# Patient Record
Sex: Male | Born: 1945 | Race: Black or African American | Hispanic: No | Marital: Married | Smoking: Former smoker
Health system: Southern US, Community
[De-identification: ages and names within clinical notes are randomized; demographics above are authoritative.]

## PROBLEM LIST (undated history)

## (undated) DIAGNOSIS — E785 Hyperlipidemia, unspecified: Secondary | ICD-10-CM

## (undated) DIAGNOSIS — M549 Dorsalgia, unspecified: Secondary | ICD-10-CM

## (undated) DIAGNOSIS — K219 Gastro-esophageal reflux disease without esophagitis: Secondary | ICD-10-CM

## (undated) DIAGNOSIS — K7460 Unspecified cirrhosis of liver: Secondary | ICD-10-CM

## (undated) DIAGNOSIS — J449 Chronic obstructive pulmonary disease, unspecified: Secondary | ICD-10-CM

## (undated) DIAGNOSIS — I10 Essential (primary) hypertension: Secondary | ICD-10-CM

## (undated) DIAGNOSIS — N529 Male erectile dysfunction, unspecified: Secondary | ICD-10-CM

## (undated) DIAGNOSIS — N400 Enlarged prostate without lower urinary tract symptoms: Secondary | ICD-10-CM

## (undated) DIAGNOSIS — D649 Anemia, unspecified: Secondary | ICD-10-CM

## (undated) HISTORY — DX: Dorsalgia, unspecified: M54.9

## (undated) HISTORY — DX: Hyperlipidemia, unspecified: E78.5

## (undated) HISTORY — DX: Male erectile dysfunction, unspecified: N52.9

## (undated) HISTORY — PX: HERNIA REPAIR: SHX51

## (undated) HISTORY — DX: Benign prostatic hyperplasia without lower urinary tract symptoms: N40.0

## (undated) NOTE — *Deleted (*Deleted)
[facility]  [address] [place], [place] [address] Phone: [phone]  Fax: [phone]   Office Visit:  07/14/20  Referring physician: Maple Hudson.,*  Chief Complaint: Dwayne Thompson is a 35 y.o. male with extensive stage small cell lung cancer who is seen for 1 week assessment.  HPI:  The patient was last seen in the medical oncology clinic on 07/06/2020. At that time, ***. Hematocrit was 28.6, hemoglobin 9.9, MCV 85.4, platelets 149,000, WBC 9,000. Sodium was 128. Chloride was 96. Calcium was 8.6. Albumin was 2.7. TSH was 1.679 and free T4 was 7.4. Magnesium was 1.7. He received cycle #1 carboplatin, etoposide and Tecentriq.  He received Etoposide on 07/07/2020 and 07/08/2020. He received Neulasta on 07/09/2020.  The patient saw Dr. Thelma Barge on 07/13/2020. His breathing was somewhat better. He had been draining his Pleurx catheter every 2-3 days. They planned to remove the catheter in 1 week if he still had minimal drainage.  CXR on 07/13/2020 showed PleurX catheter present. Left pleural effusion appeared similar on the left, partially loculated. Airspace opacity throughout the mid and lower lung regions. There was slightly less opacity in the left upper lobe compared to most recent study. Right lung was essentially clear. Stable cardiac silhouette.  During the interim, ***  Past Medical History:  Diagnosis Date  . Anemia   . Back pain   . BPH (benign prostatic hyperplasia)   . Cirrhosis (HCC)   . COPD (chronic obstructive pulmonary disease) (HCC)   . ED (erectile dysfunction)   . GERD (gastroesophageal reflux disease)   . Hyperlipidemia   . Hypertension    Past Surgical History:  Procedure Laterality Date  . COLONOSCOPY Left 01/03/2018   Procedure: COLONOSCOPY;  Surgeon: Pasty Spillers, MD;  Location: [facility] ENDOSCOPY;  Service: Endoscopy;  Laterality: Left;  . COLONOSCOPY WITH PROPOFOL N/A 04/05/2018   Procedure:  COLONOSCOPY WITH PROPOFOL;  Surgeon: Pasty Spillers, MD;  Location: [facility] ENDOSCOPY;  Service: Endoscopy;  Laterality: N/A;  . CYSTOSCOPY WITH STENT PLACEMENT Right 09/26/2019   Procedure: CYSTOSCOPY WITH STENT PLACEMENT;  Surgeon: Orson Ape, MD;  Location: [facility] ORS;  Service: Urology;  Laterality: Right;  . ENTEROSCOPY  01/03/2018   Procedure: ENTEROSCOPY;  Surgeon: Pasty Spillers, MD;  Location: [facility] ENDOSCOPY;  Service: Endoscopy;;  . ESOPHAGOGASTRODUODENOSCOPY N/A 01/02/2018   Procedure: ESOPHAGOGASTRODUODENOSCOPY (EGD);  Surgeon: Pasty Spillers, MD;  Location: [facility] ENDOSCOPY;  Service: Endoscopy;  Laterality: N/A;  . ESOPHAGOGASTRODUODENOSCOPY (EGD) WITH PROPOFOL N/A 04/05/2018   Procedure: ESOPHAGOGASTRODUODENOSCOPY (EGD) WITH PROPOFOL;  Surgeon: Pasty Spillers, MD;  Location: [facility] ENDOSCOPY;  Service: Endoscopy;  Laterality: N/A;  . exploration and neurolysis of right peroneal nerve at the knee Right 08/27/2018  . GREEN LIGHT LASER TURP (TRANSURETHRAL RESECTION OF PROSTATE N/A 09/26/2019   Procedure: GREEN LIGHT LASER TURP (TRANSURETHRAL RESECTION OF PROSTATE;  Surgeon: Orson Ape, MD;  Location: [facility] ORS;  Service: Urology;  Laterality: N/A;  . HERNIA REPAIR     1980's  . PORTACATH PLACEMENT Left 06/29/2020   Procedure: INSERTION PORT-A-CATH;  Surgeon: Hulda Marin, MD;  Location: [facility] ORS;  Service: Thoracic;  Laterality: Left;  Marland Kitchen VIDEO ASSISTED THORACOSCOPY (VATS)/THOROCOTOMY Left 06/29/2020   Procedure: VIDEO ASSISTED THORACOSCOPY;  Surgeon: Hulda Marin, MD;  Location: [facility] ORS;  Service: Thoracic;  Laterality: Left;   Family History  Problem Relation Age of Onset  . Alzheimer's disease Mother   . Sarcoidosis Sister   . Anxiety disorder Brother   .  Kidney disease Neg Hx   . Prostate cancer Neg Hx      Social History: reports that he quit smoking about 6 years ago. His smoking use included cigarettes. He has a 50.00 pack-year smoking history. He  has never used smokeless tobacco. He reports current alcohol use of about 21.0 - 28.0 standard drinks of alcohol per week. He reports that he does not use drugs. He drinks 2-3 alcohol shots/day. His wife's name isJanice and granddaughter's name isAriel. The patient is accompanied by his wife and Kara Dies, nurse navigator*** today.  Allergies:  Allergies  Allergen Reactions  . Indomethacin Swelling    ankle swelling  . Tamsulosin Hcl Hives  . Sulfa Antibiotics Rash   Current Outpatient Medications  Medication Instructions  . acetaminophen (TYLENOL) 650 mg, Oral, 3 times daily  . cetirizine (ZYRTEC) 10 mg, Oral, Daily  . cholecalciferol (VITAMIN D3) 1,000 Units, Oral, Daily  . cyanocobalamin 1,000 mcg, Oral, Daily  . diclofenac Sodium (VOLTAREN) 1 % GEL 1 application, Topical, 4 times daily PRN  . dutasteride (AVODART) 0.5 mg, Oral, Daily  . feeding supplement (ENSURE ENLIVE / ENSURE PLUS) LIQD 237 mLs, Oral, 2 times daily between meals  . folic acid (FOLVITE) 1 mg, Oral, Daily  . lactulose (CHRONULAC) 30 g, Oral, 2 times daily  . lidocaine (LIDODERM) 5 % 1 patch, Transdermal, Every 24 hours, Remove & Discard patch within 12 hours or as directed by MD  . Lidocaine (SALONPAS PAIN RELIEVING) 4 % PTCH 1 patch, Apply externally,  Every morning - 10a  . megestrol (MEGACE) 200 mg, Oral, Daily  . midodrine (PROAMATINE) 10 mg, Oral, 4 times daily  . montelukast (SINGULAIR) 10 MG tablet TAKE 1 TABLET BY MOUTH EVERY DAY  . ondansetron (ZOFRAN) 8 mg, Oral, 2 times daily PRN, Start on day 3 after carboplatin chemo.  Marland Kitchen oxyCODONE (ROXICODONE) 15 mg, Oral, Every 4 hours PRN  . pantoprazole (PROTONIX) 20 mg, Oral, 2 times daily  . polyethylene glycol (MIRALAX / GLYCOLAX) 17 g, Oral, Daily PRN  . potassium chloride (K-DUR,KLOR-CON) 10 MEQ tablet 10 mEq, Oral, 2 times daily  . prochlorperazine (COMPAZINE) 10 mg, Oral, Every 6 hours PRN  . rosuvastatin (CRESTOR) 10 mg, Oral, Daily    Review of  Systems  Constitutional: Negative for chills, diaphoresis, fever, malaise/fatigue and weight loss.  HENT: Negative for congestion, ear discharge, ear pain, hearing loss, nosebleeds, sinus pain, sore throat and tinnitus.   Eyes: Negative for blurred vision and double vision.  Respiratory: Positive for shortness of breath (on exertion). Negative for cough, hemoptysis and sputum production.        Left-sided chest wall pain. On 2 liters/min oxygen prn.  Cardiovascular: Negative for chest pain, palpitations and leg swelling.  Gastrointestinal: Positive for constipation (takes miralax BID). Negative for abdominal pain, blood in stool, diarrhea, heartburn, melena, nausea and vomiting.       Poor appetite. Drinking Ensure BID.  Genitourinary: Negative for frequency and urgency.  Musculoskeletal: Negative for back pain, joint pain, myalgias and neck pain.  Skin: Negative for itching and rash.       Bed sore.  Neurological: Negative for dizziness, tingling, sensory change, weakness and headaches.  Endo/Heme/Allergies: Does not bruise/bleed easily.  Psychiatric/Behavioral: Negative for depression and memory loss. The patient is not nervous/anxious and does not have insomnia.   All other systems reviewed and are negative.   Performance status: 2***  Vital Signs Blood pressure (!) 102/58, pulse 92, temperature 97.8 F (36.6 C), temperature  source Tympanic, weight 181 lb (82.1 kg), SpO2 97 %.  Physical Exam Vitals and nursing note reviewed.  Constitutional:      General: He is not in acute distress.    Appearance: He is not diaphoretic.  HENT:     Head: Normocephalic and atraumatic.     Comments: Short graying hair.    Mouth/Throat:     Mouth: Mucous membranes are moist.     Pharynx: Oropharynx is clear.  Eyes:     General: No scleral icterus.    Extraocular Movements: Extraocular movements intact.     Conjunctiva/sclera: Conjunctivae normal.     Pupils: Pupils are equal, round, and  reactive to light.     Comments: Glasses. Brown eyes.  Cardiovascular:     Rate and Rhythm: Normal rate and regular rhythm.     Heart sounds: Normal heart sounds. No murmur heard.   Pulmonary:     Effort: Pulmonary effort is normal. No respiratory distress.     Breath sounds: Normal breath sounds. No wheezing or rales.  Chest:     Chest wall: No tenderness.  Abdominal:     General: Bowel sounds are normal. There is no distension.     Palpations: Abdomen is soft. There is no mass.     Tenderness: There is no abdominal tenderness. There is no guarding or rebound.  Musculoskeletal:        General: Tenderness (left chest wall) present. No swelling. Normal range of motion.     Cervical back: Normal range of motion and neck supple.     Right lower leg: Edema (ankles) present.     Left lower leg: Edema (ankles) present.  Skin:    General: Skin is warm and dry.     Comments: Lidoderm patch on left chest. Pressure ulcer on bottom.  Neurological:     Mental Status: He is alert and oriented to person, place, and time.  Psychiatric:        Behavior: Behavior normal.        Thought Content: Thought content normal.        Judgment: Judgment normal.     No results found for this or any previous visit (from the past 48 hour(s)). DG Chest 2 View  Result Date: 07/13/2020 CLINICAL DATA:  Pleural effusion EXAM: CHEST - 2 VIEW COMPARISON:  June 29, 2020 FINDINGS: PleurX catheter again noted on the left with tip directed posteriorly. There is partially loculated left pleural effusion. There is airspace consolidation throughout mid and lower lung regions on the left. The right lung is clear. Heart is slightly enlarged with pulmonary vascularity normal. No adenopathy. No pneumothorax. There is bullous disease in the extreme right apex. No bone lesions. Port-A-Cath tip in superior vena cava. IMPRESSION: PleurX catheter present. Left pleural effusion appears similar on the left, partially loculated.  Airspace opacity throughout the mid and lower lung regions. Slightly less opacity in the left upper lobe compared to most recent study. Right lung essentially clear. Stable cardiac silhouette. Electronically Signed   By: Bretta Bang III M.D.   On: 07/13/2020 10:10    Assessment:  Dwayne Thompson is a 38 y.o. male with extensive stage small cell lung cancer s/p left chest wall/pleural biopsy on 06/24/2020.  He presented with left hemothorax. Thoracentesis x2 (05/20/2020 and 06/05/2020) revealed bloody fluid with no evidence of malignancy.  He has a chest tube in place.  Chest CT on 06/05/2020 revealed pleural thickening, nodularity and pleural-based mass with central adenopathy  versus mass suspicious for pulmonary neoplasm with extensive pleural metastasis.  Mesothelioma is in the differential.  There was increasing left subcarinal lymph node use into the left mainstem bronchus.  There was increasing upper abdominal lymph nodes.  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  There was accentuated activity along the consolidative and atelectatic left lower lobe probably from tumor within the lung but potentially from tumor along the visceral pleura.  There was hypermetabolic thoracic adenopathy along with hypermetabolic upper abdominal adenopathy and hypermetabolic skeletal metastatic lesions (right iliac bone SUV 11.9 and T1). There was possible early tumor extension into the left T11-12 neuroforamen with some erosion of the adjacent T11 vertebral body.  CXR on 06/22/2020 revealed complete of opacification of the left lung secondary to a large pleural effusion.  There was right sided shift.  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 He drinks alcohol daily.  Symptomatically, ***  Plan:   1.   Labs today:  CBC with diff, BMP, Mg   2.   Extensive stage small  cell lung cancer             He has a 50 pack year smoking history.             PET scan on 06/12/2020 revealed pleural based disease, hypermetabolic adenopathy, and skeletal metastasis.             Pleural biopsy on 06/24/2020 confirmed small cell lung cancer.  Head MRI  and thoracic spine MRI were unable to be performed today secondary to pain.   He was unable to lie flat.  Will try to repeat prior to discharge.  Review plan for carboplatin, etopside, and Tecentriq every 3 weeks in the outpatient department.   Information sheets on chemotherapy provided today.  Discuss plan for outpatient chemotherapy this week (3 days).  Patient scheduled for port-a-cath tomorrow.  Will arrange chemotherapy teaching as an inpatient.  Several questions asked and answered. 2.   Recurrent pleural effusion  Chest tube remains in place.  Patient scheduled for thoracoscopy with talc pleurodesis tomorrow. 3.   Normocytic anemia             Hematocrit 28.8.  Hemoglobin 9.8.  MCV 89.2.   Ferritin 112 with an iron saturation of 13% (low) and TIBC 328 on 05/20/2020.   Ferritin 359 with an iron saturation of 14% (low) and TIBC 181 on 06/27/2020.  Etiology secondary to hemothorax.           4.   Renal insufficiency             Creatinine 0.87 today.  Creatinine 2.75 on admission.             Baseline creatinine 0.85 - 1.12. 5.  Cancer-related pain             Symptomatically, pain has improved since admission.  Pain exacerbated with MRI attempt today.  Continue oxycodone 10 mg po q 4 hours prn pain. 6.   Disposition  Discuss plans for possible discharge early this week to allow 3 days of chemotherapy in the outpatient department.   Please wait for chemistries to ensure OK for chemo. Begin IVF 150 cc/hr prior to chemotherapy.  Patient's wife bringing midodrine. RN:  Ensure samples. Day 1 of cycle #1 carboplatin, etoposide, and Tecentriq today. RTC tomorrow for day 2 etoposide. RTC on  Wednesday for day 3  etoposide. RTC on Thursday for Neulasta. TYC on 10/27 for MD assess, and labs (CBC with diff, BMP, Mg).  I discussed the assessment and treatment plan with the patient.  The patient was provided an opportunity to ask questions and all were answered.  The patient agreed with the plan and demonstrated an understanding of the instructions.  The patient was advised to call back or seek an in person evaluation if the symptoms worsen or if the condition fails to improve as anticipated.  I provided 19 minutes of face-to-face time during this this encounter and > 50% was spent counseling as documented under my assessment and plan.  Nelva Nay, MD, PhD  02/19/2020, 2:42 PM  I, Danella Penton Tufford, am acting as a Neurosurgeon for Rosey Bath, MD.  I, Melissa C. Merlene Pulling, MD, have reviewed the above documentation for accuracy and completeness, and I agree with the above.

---

## 2005-03-02 ENCOUNTER — Ambulatory Visit: Payer: Self-pay | Admitting: Internal Medicine

## 2007-08-20 ENCOUNTER — Ambulatory Visit: Payer: Self-pay | Admitting: Internal Medicine

## 2007-09-11 ENCOUNTER — Ambulatory Visit: Payer: Self-pay | Admitting: Internal Medicine

## 2007-09-20 ENCOUNTER — Ambulatory Visit: Payer: Self-pay | Admitting: Internal Medicine

## 2007-10-21 ENCOUNTER — Ambulatory Visit: Payer: Self-pay | Admitting: Internal Medicine

## 2007-11-18 ENCOUNTER — Ambulatory Visit: Payer: Self-pay | Admitting: Internal Medicine

## 2010-10-11 ENCOUNTER — Ambulatory Visit: Payer: Self-pay | Admitting: Family Medicine

## 2013-03-04 ENCOUNTER — Ambulatory Visit: Payer: Self-pay | Admitting: Family Medicine

## 2013-10-18 LAB — LIPID PANEL
Cholesterol: 151 mg/dL (ref 0–200)
HDL: 53 mg/dL (ref 35–70)
LDL Cholesterol: 78 mg/dL
Triglycerides: 101 mg/dL (ref 40–160)

## 2014-02-06 LAB — CBC AND DIFFERENTIAL
HCT: 44 % (ref 41–53)
Hemoglobin: 15.5 g/dL (ref 13.5–17.5)
Neutrophils Absolute: 3 /uL
Platelets: 115 10*3/uL — AB (ref 150–399)
WBC: 5.6 10^3/mL

## 2014-02-06 LAB — BASIC METABOLIC PANEL
BUN: 11 mg/dL (ref 4–21)
Creatinine: 0.8 mg/dL (ref 0.6–1.3)
Glucose: 94 mg/dL
Potassium: 3.4 mmol/L (ref 3.4–5.3)
Sodium: 140 mmol/L (ref 137–147)

## 2014-02-06 LAB — HEPATIC FUNCTION PANEL
ALT: 17 U/L (ref 10–40)
AST: 22 U/L (ref 14–40)
Alkaline Phosphatase: 57 U/L (ref 25–125)
Bilirubin, Total: 0.4 mg/dL

## 2014-10-22 LAB — PSA: PSA: 3

## 2015-01-13 ENCOUNTER — Ambulatory Visit
Admit: 2015-01-13 | Disposition: A | Discharge status: Home / Self Care | Payer: Self-pay | Attending: Gastroenterology | Admitting: Gastroenterology

## 2015-01-13 LAB — HM COLONOSCOPY

## 2015-01-15 LAB — SURGICAL PATHOLOGY

## 2015-03-28 DIAGNOSIS — N529 Male erectile dysfunction, unspecified: Secondary | ICD-10-CM | POA: Insufficient documentation

## 2015-03-28 DIAGNOSIS — G939 Disorder of brain, unspecified: Secondary | ICD-10-CM | POA: Insufficient documentation

## 2015-03-28 DIAGNOSIS — J309 Allergic rhinitis, unspecified: Secondary | ICD-10-CM | POA: Insufficient documentation

## 2015-03-28 DIAGNOSIS — N400 Enlarged prostate without lower urinary tract symptoms: Secondary | ICD-10-CM | POA: Insufficient documentation

## 2015-03-28 DIAGNOSIS — K219 Gastro-esophageal reflux disease without esophagitis: Secondary | ICD-10-CM | POA: Insufficient documentation

## 2015-03-28 DIAGNOSIS — I6782 Cerebral ischemia: Secondary | ICD-10-CM | POA: Insufficient documentation

## 2015-03-28 DIAGNOSIS — IMO0002 Reserved for concepts with insufficient information to code with codable children: Secondary | ICD-10-CM | POA: Insufficient documentation

## 2015-03-28 DIAGNOSIS — Z87891 Personal history of nicotine dependence: Secondary | ICD-10-CM | POA: Insufficient documentation

## 2015-03-28 DIAGNOSIS — M1990 Unspecified osteoarthritis, unspecified site: Secondary | ICD-10-CM | POA: Insufficient documentation

## 2015-04-29 ENCOUNTER — Encounter: Payer: Self-pay | Admitting: Family Medicine

## 2015-04-29 ENCOUNTER — Ambulatory Visit (INDEPENDENT_AMBULATORY_CARE_PROVIDER_SITE_OTHER): Payer: Medicare Other | Admitting: Family Medicine

## 2015-04-29 VITALS — BP 128/82 | HR 72 | Temp 98.1°F | Resp 14 | Ht 71.5 in | Wt 203.0 lb

## 2015-04-29 DIAGNOSIS — Z791 Long term (current) use of non-steroidal anti-inflammatories (NSAID): Secondary | ICD-10-CM | POA: Diagnosis not present

## 2015-04-29 DIAGNOSIS — M47896 Other spondylosis, lumbar region: Secondary | ICD-10-CM

## 2015-04-29 DIAGNOSIS — N400 Enlarged prostate without lower urinary tract symptoms: Secondary | ICD-10-CM | POA: Diagnosis not present

## 2015-04-29 DIAGNOSIS — IMO0002 Reserved for concepts with insufficient information to code with codable children: Secondary | ICD-10-CM

## 2015-04-29 DIAGNOSIS — D696 Thrombocytopenia, unspecified: Secondary | ICD-10-CM

## 2015-04-29 DIAGNOSIS — M47816 Spondylosis without myelopathy or radiculopathy, lumbar region: Secondary | ICD-10-CM | POA: Insufficient documentation

## 2015-04-29 NOTE — Progress Notes (Signed)
Patient ID: ARK AGRUSA, male   DOB: 08-20-1946, 69 y.o.   MRN: [id number]    Subjective:  HPI  Chronic back pain follow up:  Patient is here for 6 months follow up.  He takes Ibuprofen 800 mg at bedtime 1 tablet at bedtime.  He has to take 2 tables very seldom. Medication is controlling symptoms.  BPH follow up: Patient takes Cardura daily and this helps his symptoms. No issues today per patient.   Last labs were in 01/2014.  Prior to Admission medications   Medication Sig Start Date End Date Taking? Authorizing Provider  aspirin 81 MG tablet Take by mouth.   Yes Historical Provider, MD  Cholecalciferol 1000 UNITS tablet Take by mouth.   Yes Historical Provider, MD  doxazosin (CARDURA) 4 MG tablet Take by mouth. 11/10/14  Yes Historical Provider, MD  ibuprofen (ADVIL,MOTRIN) 800 MG tablet Take by mouth. 07/12/14  Yes Historical Provider, MD  Multiple Vitamin (MULTIVITAMIN) tablet Take 1 tablet by mouth daily.   Yes Historical Provider, MD  Omega-3 Fatty Acids (FISH OIL BURP-LESS) 1200 MG CAPS Take by mouth.   Yes Historical Provider, MD  sildenafil (VIAGRA) 100 MG tablet Take by mouth. 05/31/13  Yes Historical Provider, MD    Patient Active Problem List   Diagnosis Date Noted  . Allergic rhinitis 03/28/2015  . 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 Failure of erection 03/28/2015  . Acid reflux 03/28/2015  . Arthritis, degenerative 03/28/2015  . Change in blood platelet count 03/28/2015  . Temporary cerebral vascular dysfunction 03/28/2015  . Current tobacco use 03/28/2015    No past medical history on file.  Social History   Social History  . Marital Status: Married    Spouse Name: [name]  . Number of Children: 3  . Years of Education: 12   Occupational History  . retired    Social History Main Topics  . Smoking status: Former Smoker -- 0.50 packs/day for 25 years    Types: Cigarettes    Quit date: 10/20/2012  . Smokeless tobacco: Never Used  . Alcohol Use:  Yes     Comment: moderate  . Drug Use: No  . Sexual Activity: Not Currently   Other Topics Concern  . Not on file   Social History Narrative    Allergies  Allergen Reactions  . Tamsulosin Hcl Hives    Review of Systems  Constitutional: Negative for fever, chills, weight loss and malaise/fatigue.  Respiratory: Negative for cough, hemoptysis, sputum production and shortness of breath.   Cardiovascular: Negative for chest pain, palpitations, orthopnea and leg swelling.  Gastrointestinal: Negative for heartburn, nausea and vomiting.  Musculoskeletal: Positive for back pain. Negative for myalgias, joint pain, falls and neck pain.  Neurological: Negative for dizziness, tingling, tremors, weakness and headaches.  Psychiatric/Behavioral: Negative for memory loss. The patient is not nervous/anxious and does not have insomnia.     Immunization History  Administered Date(s) Administered  . Pneumococcal Conjugate-13 02/06/2014  . Pneumococcal Polysaccharide-23 03/05/2012  . Tdap 04/28/2007   Objective:  BP 128/82 mmHg  Pulse 72  Temp(Src) 98.1 F (36.7 C)  Resp 14  Ht 5' 11.5" (1.816 m)  Wt 203 lb (92.08 kg)  BMI 27.92 kg/m2  Physical Exam  Constitutional: He is oriented to person, place, and time and well-developed, well-nourished, and in no distress.  HENT:  Head: Normocephalic.  Eyes: Conjunctivae are normal. Pupils are equal, round, and reactive to light.  Neck: Normal range of motion. Neck supple.  Cardiovascular: Normal rate, regular rhythm, normal heart sounds and intact distal pulses.   No murmur heard. Pulmonary/Chest: Effort normal and breath sounds normal.  Musculoskeletal: Normal range of motion. He exhibits no edema or tenderness.  Neurological: He is alert and oriented to person, place, and time. Gait normal.  Psychiatric: Mood, memory, affect and judgment normal.    Lab Results  Component Value Date   WBC 5.6 02/06/2014   HGB 15.5 02/06/2014   HCT 44  02/06/2014   PLT 115* 02/06/2014   CHOL 151 10/18/2013   TRIG 101 10/18/2013   HDL 53 10/18/2013   LDLCALC 78 10/18/2013   PSA 3.0 10/22/2014    CMP     Component Value Date/Time   NA 140 02/06/2014   K 3.4 02/06/2014   BUN 11 02/06/2014   CREATININE 0.8 02/06/2014   AST 22 02/06/2014   ALT 17 02/06/2014   ALKPHOS 57 02/06/2014    Assessment and Plan :  1. Benign fibroma of prostate Stable on medication.  2. Other osteoarthritis of spine, lumbar region Stable. Advised to not over use Ibuprofen due to potential risks of on going use of this.  3. Change in blood platelet count History of this. Check labs. - CBC with Differential/Platelet  4. Encounter for long-term use of non-steroidal anti-inflammatory medication Pending results. - COMPLETE METABOLIC PANEL WITH GFR Advised patient to only use ibuprofen when he is having significant back pain. Advised of the risk of ulcers, kidney damage, and liver risk.    Patient was seen and examined by Dr. Eulas Post and note was scribed by Theressa Millard, RMA.     [name] MD [facility] 04/29/2015 8:21 AM

## 2015-04-30 ENCOUNTER — Telehealth: Payer: Self-pay | Admitting: Family Medicine

## 2015-04-30 LAB — CBC WITH DIFFERENTIAL/PLATELET
Basophils Absolute: 0 10*3/uL (ref 0.0–0.2)
Basos: 1 %
EOS (ABSOLUTE): 0.2 10*3/uL (ref 0.0–0.4)
Eos: 4 %
Hematocrit: 45.9 % (ref 37.5–51.0)
Hemoglobin: 15.6 g/dL (ref 12.6–17.7)
Immature Grans (Abs): 0 10*3/uL (ref 0.0–0.1)
Immature Granulocytes: 0 %
Lymphocytes Absolute: 1.9 10*3/uL (ref 0.7–3.1)
Lymphs: 34 %
MCH: 32.4 pg (ref 26.6–33.0)
MCHC: 34 g/dL (ref 31.5–35.7)
MCV: 95 fL (ref 79–97)
Monocytes Absolute: 0.5 10*3/uL (ref 0.1–0.9)
Monocytes: 9 %
Neutrophils Absolute: 2.9 10*3/uL (ref 1.4–7.0)
Neutrophils: 52 %
Platelets: 121 10*3/uL — ABNORMAL LOW (ref 150–379)
RBC: 4.81 x10E6/uL (ref 4.14–5.80)
RDW: 14.2 % (ref 12.3–15.4)
WBC: 5.5 10*3/uL (ref 3.4–10.8)

## 2015-04-30 LAB — COMPREHENSIVE METABOLIC PANEL
ALT: 44 IU/L (ref 0–44)
AST: 41 IU/L — ABNORMAL HIGH (ref 0–40)
Albumin/Globulin Ratio: 1.7 (ref 1.1–2.5)
Albumin: 4.6 g/dL (ref 3.6–4.8)
Alkaline Phosphatase: 65 IU/L (ref 39–117)
BUN/Creatinine Ratio: 9 — ABNORMAL LOW (ref 10–22)
BUN: 8 mg/dL (ref 8–27)
Bilirubin Total: 0.3 mg/dL (ref 0.0–1.2)
CO2: 23 mmol/L (ref 18–29)
Calcium: 9.8 mg/dL (ref 8.6–10.2)
Chloride: 101 mmol/L (ref 97–108)
Creatinine, Ser: 0.85 mg/dL (ref 0.76–1.27)
GFR calc Af Amer: 103 mL/min/{1.73_m2} (ref >59)
GFR calc non Af Amer: 89 mL/min/{1.73_m2} (ref >59)
Globulin, Total: 2.7 g/dL (ref 1.5–4.5)
Glucose: 106 mg/dL — ABNORMAL HIGH (ref 65–99)
Potassium: 4.2 mmol/L (ref 3.5–5.2)
Sodium: 143 mmol/L (ref 134–144)
Total Protein: 7.3 g/dL (ref 6.0–8.5)

## 2015-04-30 NOTE — Telephone Encounter (Signed)
Pt stated he was returning Brittany's call. Thanks TNP

## 2015-07-24 ENCOUNTER — Other Ambulatory Visit: Payer: Self-pay | Admitting: Family Medicine

## 2015-08-19 ENCOUNTER — Ambulatory Visit (INDEPENDENT_AMBULATORY_CARE_PROVIDER_SITE_OTHER): Payer: Medicare Other

## 2015-08-19 DIAGNOSIS — Z23 Encounter for immunization: Secondary | ICD-10-CM

## 2015-09-01 ENCOUNTER — Other Ambulatory Visit: Payer: Self-pay | Admitting: Family Medicine

## 2015-09-01 NOTE — Telephone Encounter (Signed)
Dr. Inez Pilgrim not going to refill this one due to his age without you confirming it ok Thanks ED

## 2015-10-29 ENCOUNTER — Encounter: Payer: Medicare Other | Admitting: Family Medicine

## 2015-11-18 ENCOUNTER — Ambulatory Visit (INDEPENDENT_AMBULATORY_CARE_PROVIDER_SITE_OTHER): Payer: Medicare Other | Admitting: Family Medicine

## 2015-11-18 ENCOUNTER — Encounter: Payer: Self-pay | Admitting: Family Medicine

## 2015-11-18 VITALS — BP 128/82 | HR 80 | Temp 97.8°F | Resp 12 | Ht 71.0 in | Wt 209.0 lb

## 2015-11-18 DIAGNOSIS — Z0000 Encounter for general adult medical examination without abnormal findings: Secondary | ICD-10-CM

## 2015-11-18 NOTE — Progress Notes (Signed)
Patient ID: [name], male   DOB: [birth year], 70 y.o.   MRN: [id number]  Visit Date: 11/18/2015  Today's Provider: [name], MD   Chief Complaint  Patient presents with  . Medicare Wellness   Subjective:   Dwayne Thompson is a 70 y.o. male who presents today for his Subsequent Annual Wellness Visit. He feels well. He reports exercising none. He reports he is sleeping well. Immunization History  Administered Date(s) Administered  . Influenza, High Dose Seasonal PF 08/19/2015  . Pneumococcal Conjugate-13 02/06/2014  . Pneumococcal Polysaccharide-23 03/05/2012  . Tdap 04/28/2007  Patient states he knows he needs to stop drinking alcohol so much. He drinks daily 3 to 4 drinks a day.   Last colonoscopy 01/13/15 Dr Rayann Heman, 4 polyps, internal hemorrhoids, repeat in 5 years.   Review of Systems  Constitutional: Negative.   HENT: Negative.   Eyes: Negative.   Respiratory: Negative.   Cardiovascular: Negative.   Gastrointestinal: Negative.   Endocrine: Negative.   Genitourinary: Negative.   Musculoskeletal: Negative.   Skin: Negative.   Allergic/Immunologic: Negative.   Neurological: Negative.   Hematological: Negative.   Psychiatric/Behavioral: Negative.     Patient Active Problem List   Diagnosis Date Noted  . 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 Allergic rhinitis 03/28/2015  . 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 Failure of erection 03/28/2015  . Acid reflux 03/28/2015  . Arthritis, degenerative 03/28/2015  . Change in blood platelet count 03/28/2015  . Temporary cerebral vascular dysfunction 03/28/2015  . Current tobacco use 03/28/2015    Social History   Social History  . Marital Status: Married    Spouse Name: Dwayne Thompson  . Number of Children: 3  . Years of Education: 12   Occupational History  . retired    Social History Main Topics  . Smoking status: Former Smoker -- 0.50 packs/day for 25 years    Types: Cigarettes    Quit date:  10/20/2012  . Smokeless tobacco: Never Used  . Alcohol Use: Yes     Comment: moderate  . Drug Use: No  . Sexual Activity: Not Currently   Other Topics Concern  . Not on file   Social History Narrative    Past Surgical History  Procedure Laterality Date  . Hernia repair      His family history includes Alzheimer's disease in his mother; Anxiety disorder in his brother; Diabetes in his brother; Hypertension in his brother; Sarcoidosis in his sister.    Outpatient Prescriptions Prior to Visit  Medication Sig Dispense Refill  . aspirin 81 MG tablet Take by mouth.    . Cholecalciferol 1000 UNITS tablet Take by mouth.    . doxazosin (CARDURA) 4 MG tablet TAKE 1 TABLET DAILY 90 tablet 2  . ibuprofen (ADVIL,MOTRIN) 800 MG tablet TAKE 1 TABLET THREE TIMES A DAY AS NEEDED 270 tablet 2  . Multiple Vitamin (MULTIVITAMIN) tablet Take 1 tablet by mouth daily.    . Omega-3 Fatty Acids (FISH OIL BURP-LESS) 1200 MG CAPS Take by mouth.    . sildenafil (VIAGRA) 100 MG tablet Take by mouth.     No facility-administered medications prior to visit.    Allergies  Allergen Reactions  . Tamsulosin Hcl Hives    Patient Care Team: Jerrol Banana., MD as PCP - General (Family Medicine)  Objective:   Vitals:  Filed Vitals:   11/18/15 0942  BP: 128/82  Pulse: 80  Temp: 97.8 F (36.6 C)  Resp:  12  Height: '5\' 11"'$  (1.803 m)  Weight: 209 lb (94.802 kg)    Physical Exam  Constitutional: He is oriented to person, place, and time. He appears well-developed and well-nourished.  HENT:  Head: Normocephalic and atraumatic.  Right Ear: External ear normal.  Left Ear: External ear normal.  Nose: Nose normal.  Mouth/Throat: Oropharynx is clear and moist.  Eyes: Conjunctivae and EOM are normal. Pupils are equal, round, and reactive to light.  Neck: Neck supple. No thyromegaly present.  Cardiovascular: Normal rate, regular rhythm, normal heart sounds and intact distal pulses.    Pulmonary/Chest: Effort normal and breath sounds normal.  Abdominal: Soft.  Musculoskeletal: He exhibits no edema.  Lymphadenopathy:    He has no cervical adenopathy.  Neurological: He is alert and oriented to person, place, and time.  Skin: Skin is warm and dry.  Psychiatric: He has a normal mood and affect. His behavior is normal. Judgment and thought content normal.    Activities of Daily Living In your present state of health, do you have any difficulty performing the following activities: 04/29/2015  Hearing? N  Vision? N  Difficulty concentrating or making decisions? N  Walking or climbing stairs? N  Dressing or bathing? N  Doing errands, shopping? N    Fall Risk Assessment Fall Risk  04/29/2015  Falls in the past year? No     Depression Screen PHQ 2/9 Scores 04/29/2015  PHQ - 2 Score 0    Cognitive Testing - 6-CIT    Year: 0 4 points  Month: 0 3 points  Memorize "Pia Mau, [address] [place]"  Time (within 1 hour:) 0 3 points  Count backwards from 20: 0 2 4 points  Name months of year: 0 2 4 points  Repeat Address: 0 '2 4 6 8 10 '$ points   Total Score: 8/28  Interpretation : Normal (0-7) Abnormal (8-28)    Assessment & Plan:     Annual Wellness Visit  Reviewed patient's Family Medical History Reviewed and updated list of patient's medical providers Assessment of cognitive impairment was done Assessed patient's functional ability Established a written schedule for health screening [place] Completed and Reviewed Patient now has 8 grandchildren. [name] is 57 weeks old. He completely retire from work 6 years ago. ED Refer to urology as patient has complete ED now. No Erectile function at all   [name] MD [facility] 11/18/2015 9:43 AM

## 2015-12-11 ENCOUNTER — Other Ambulatory Visit: Payer: Self-pay | Admitting: Emergency Medicine

## 2015-12-11 DIAGNOSIS — N529 Male erectile dysfunction, unspecified: Secondary | ICD-10-CM

## 2015-12-24 ENCOUNTER — Encounter: Payer: Self-pay | Admitting: Urology

## 2015-12-24 ENCOUNTER — Ambulatory Visit (INDEPENDENT_AMBULATORY_CARE_PROVIDER_SITE_OTHER): Payer: Medicare Other | Admitting: Urology

## 2015-12-24 VITALS — BP 110/76 | HR 78 | Ht 71.5 in | Wt 205.2 lb

## 2015-12-24 DIAGNOSIS — N138 Other obstructive and reflux uropathy: Secondary | ICD-10-CM | POA: Insufficient documentation

## 2015-12-24 DIAGNOSIS — N528 Other male erectile dysfunction: Secondary | ICD-10-CM

## 2015-12-24 DIAGNOSIS — N529 Male erectile dysfunction, unspecified: Secondary | ICD-10-CM | POA: Insufficient documentation

## 2015-12-24 DIAGNOSIS — N401 Enlarged prostate with lower urinary tract symptoms: Secondary | ICD-10-CM | POA: Insufficient documentation

## 2015-12-24 MED ORDER — FINASTERIDE 5 MG PO TABS: 5.0000 mg | ORAL_TABLET | Freq: Every day | ORAL | Status: DC

## 2015-12-24 MED ORDER — TADALAFIL 5 MG PO TABS: ORAL_TABLET | ORAL | Status: DC

## 2015-12-24 NOTE — Progress Notes (Signed)
12/24/2015 10:10 AM   Dwayne Thompson [birth year] [id number]  Referring provider: Jerrol Banana., MD [address] [place], [place] [address]  Chief Complaint  Patient presents with  . Erectile Dysfunction    referred by Dr. Rosanna Randy    HPI: Patient is 70 year old African-American male who is referred to [place] by his primary care physician, Dr. Rosanna Randy, for erectile dysfunction.  Erectile dysfunction His SHIM score is 8, which is moderate ED.   He has been having difficulty with erections for 2 years.   His major complaint is achieving and maintaining an erection.  His libido is preserved.   His risk factors for ED are age and BPH.   He denies any painful erections or curvatures with his erections.   He has tried Viagra in the past.  States at first it was effective, but now it is ineffective.      SHIM      12/24/15 0948       SHIM: Over the last 6 months:   How do you rate your confidence that you could get and keep an erection? Very Low     When you had erections with sexual stimulation, how often were your erections hard enough for penetration (entering your partner)? Almost Never or Never     During sexual intercourse, how often were you able to maintain your erection after you had penetrated (entered) your partner? Very Difficult     During sexual intercourse, how difficult was it to maintain your erection to completion of intercourse? Very Difficult     When you attempted sexual intercourse, how often was it satisfactory for you? Very Difficult     SHIM Total Score   SHIM 8        Score: 1-7 Severe ED 8-11 Moderate ED 12-16 Mild-Moderate ED 17-21 Mild ED 22-25 No ED   He was also found to have significant voiding symptoms.His IPSS score today is 14, which is moderate lower urinary tract symptomatology. He is unhappy with his quality life due to his urinary symptoms.  His major complaint today urgency and a weak urinary stream.  He has had these  symptoms for 2 years.  He denies any dysuria, hematuria or suprapubic pain.  He currently taking Cardura 4 mg daily.  He also denies any recent fevers, chills, nausea or vomiting.  He does not have a family history of PCa.      IPSS      12/24/15 0900       International Prostate Symptom Score   How often have you had the sensation of not emptying your bladder? Not at All     How often have you had to urinate less than every two hours? About half the time     How often have you found you stopped and started again several times when you urinated? Not at All     How often have you found it difficult to postpone urination? Almost always     How often have you had a weak urinary stream? About half the time     How often have you had to strain to start urination? Less than 1 in 5 times     How many times did you typically get up at night to urinate? 2 Times     Total IPSS Score 14     Quality of Life due to urinary symptoms   If you were to spend the rest of your life  with your urinary condition just the way it is now how would you feel about that? Unhappy        Score:  1-7 Mild 8-19 Moderate 20-35 Severe   PMH: Past Medical History  Diagnosis Date  . ED (erectile dysfunction)   . Back pain     Surgical History: Past Surgical History  Procedure Laterality Date  . Hernia repair      1980's    Home Medications:    Medication List       This list is accurate as of: 12/24/15 10:10 AM.  Always use your most recent med list.               aspirin 81 MG tablet  Take by mouth.     Cholecalciferol 1000 units tablet  Take by mouth.     doxazosin 4 MG tablet  Commonly known as:  CARDURA  TAKE 1 TABLET DAILY     finasteride 5 MG tablet  Commonly known as:  PROSCAR  Take 1 tablet (5 mg total) by mouth daily.     FISH OIL BURP-LESS 1200 MG Caps  Take by mouth. Reported on 12/24/2015     ibuprofen 800 MG tablet  Commonly known as:  ADVIL,MOTRIN  TAKE 1 TABLET THREE  TIMES A DAY AS NEEDED     multivitamin tablet  Take 1 tablet by mouth daily. Reported on 12/24/2015     tadalafil 5 MG tablet  Commonly known as:  CIALIS  Take one tablet daily     VIAGRA 100 MG tablet  Generic drug:  sildenafil  Take by mouth. Reported on 12/24/2015        Allergies:  Allergies  Allergen Reactions  . Tamsulosin Hcl Hives    Family History: Family History  Problem Relation Age of Onset  . Alzheimer's disease Mother   . Sarcoidosis Sister   . Diabetes Brother   . Hypertension Brother   . Anxiety disorder Brother   . Kidney disease Neg Hx   . Prostate cancer Neg Hx     Social History:  reports that he quit smoking about 3 years ago. His smoking use included Cigarettes. He has a 12.5 pack-year smoking history. He has never used smokeless tobacco. He reports that he drinks alcohol. He reports that he does not use illicit drugs.  ROS: UROLOGY Frequent Urination?: No Hard to postpone urination?: Yes Burning/pain with urination?: No Get up at night to urinate?: Yes Leakage of urine?: Yes Urine stream starts and stops?: Yes Trouble starting stream?: No Do you have to strain to urinate?: No Blood in urine?: No Urinary tract infection?: No Sexually transmitted disease?: No Injury to kidneys or bladder?: No Painful intercourse?: No Weak stream?: Yes Erection problems?: Yes Penile pain?: No  Gastrointestinal Nausea?: No Vomiting?: No Indigestion/heartburn?: No Diarrhea?: No Constipation?: No  Constitutional Fever: No Night sweats?: Yes Weight loss?: No Fatigue?: Yes  Skin Skin rash/lesions?: No Itching?: No  Eyes Blurred vision?: No Double vision?: No  Ears/Nose/Throat Sore throat?: No Sinus problems?: No  Hematologic/Lymphatic Swollen glands?: No Easy bruising?: No  Cardiovascular Leg swelling?: No Chest pain?: No  Respiratory Cough?: No Shortness of breath?: No  Endocrine Excessive thirst?: No  Musculoskeletal Back  pain?: Yes Joint pain?: No  Neurological Headaches?: No Dizziness?: No  Psychologic Depression?: No Anxiety?: No  Physical Exam: BP 110/76 mmHg  Pulse 78  Ht 5' 11.5" (1.816 m)  Wt 205 lb 3.2 oz (93.078 kg)  BMI 28.22 kg/m2  Constitutional: Well nourished. Alert and oriented, No acute distress. HEENT: [place] AT, moist mucus membranes. Trachea midline, no masses. Cardiovascular: No clubbing, cyanosis, or edema. Respiratory: Normal respiratory effort, no increased work of breathing. GI: Abdomen is soft, non tender, non distended, no abdominal masses. Liver and spleen not palpable.  No hernias appreciated.  Stool sample for occult testing is not indicated.   GU: No CVA tenderness.  No bladder fullness or masses.  Patient with uncircumcised phallus. Foreskin easily retracted Urethral meatus is patent.  No penile discharge. No penile lesions or rashes. Scrotum without lesions, cysts, rashes and/or edema.  Testicles are located scrotally bilaterally. No masses are appreciated in the testicles. Left and right epididymis are normal. Rectal: Patient with  normal sphincter tone. Anus and perineum without scarring or rashes. No rectal masses are appreciated. Prostate is approximately 60 grams, no nodules are appreciated. Seminal vesicles are normal. Skin: No rashes, bruises or suspicious lesions. Lymph: No cervical or inguinal adenopathy. Neurologic: Grossly intact, no focal deficits, moving all 4 extremities. Psychiatric: Normal mood and affect.  Laboratory Data: Lab Results  Component Value Date   WBC 5.5 04/29/2015   HGB 15.5 02/06/2014   HCT 45.9 04/29/2015   MCV 95 04/29/2015   PLT 121* 04/29/2015    Lab Results  Component Value Date   CREATININE 0.85 04/29/2015    Lab Results  Component Value Date   PSA 3.0 10/22/2014      Component Value Date/Time   CHOL 151 10/18/2013   HDL 53 10/18/2013   LDLCALC 78 10/18/2013    Lab Results  Component Value Date   AST 41*  04/29/2015   Lab Results  Component Value Date   ALT 44 04/29/2015    Assessment & Plan:    1. Erectile dysfunction:   SHIM score is 8.  We discussed trying a different PDE5 inhibitor, intra-urethral suppositories, intracavernous vasoactive drug injection therapy, vacuum constriction device and penile prosthesis implantation.  I have given him samples of Stendra 200 mg.  He is warned not to take the [facility] with medications that contain nitrates.  I also advised him of the side effects, such as: headache, flushing, dyspepsia, abnormal vision, nasal congestion, back pain, myalgia, nausea, dizziness, and rash.  He will return in 1 month for a SHIM score.    - TSH  2. 1. BPH with LUTS:   Patient's IPSS score is 14/5.   I discussed the treatment options for BPH, such as: observation over time with prn avoidance of alcohol/caffeine; medical treatment or TURP.  Patient like to pursue medical treatment.  I will start finasteride 5 mg daily.   The side effects of finasteride are also discussed with the patient, such as: impotence, loss of interest in sex, or trouble having an orgasm; abnormal ejaculation; swelling in his hands and/or feet; swelling and/or tenderness in his breasts.  He will continue the Cardura 4 mg daily.  He is allergic to the tamsulosin as it caused hives.  I have sent in a prescription for Cialis 5 mg daily.  He will continue the Cardura until we can achieve coverage of the Cialis.  He will follow up in one month for a PVR and an IPSS.    - PSA  Return in about 1 month (around 01/23/2016) for IPSS, SHIM and exam.  These notes generated with voice recognition software. I apologize for typographical errors.  [name], [facility] [address] [place], [place] [address] [phone]

## 2015-12-25 ENCOUNTER — Telehealth: Payer: Self-pay

## 2015-12-25 LAB — TSH: TSH: 0.708 u[IU]/mL (ref 0.450–4.500)

## 2015-12-25 LAB — PSA: Prostate Specific Ag, Serum: 2.8 ng/mL (ref 0.0–4.0)

## 2015-12-25 NOTE — Telephone Encounter (Signed)
-----   Message from Nori Riis, PA-C sent at 12/25/2015  8:21 AM EDT ----- Patient's PSA and thyroid functions are normal.

## 2015-12-25 NOTE — Telephone Encounter (Signed)
LMOM

## 2015-12-28 NOTE — Telephone Encounter (Signed)
LMOM

## 2015-12-28 NOTE — Telephone Encounter (Signed)
Spoke with pt and made aware of lab results. Pt voiced understanding.  

## 2016-01-25 ENCOUNTER — Ambulatory Visit (INDEPENDENT_AMBULATORY_CARE_PROVIDER_SITE_OTHER): Payer: Medicare Other | Admitting: Urology

## 2016-01-25 ENCOUNTER — Encounter: Payer: Self-pay | Admitting: Urology

## 2016-01-25 VITALS — BP 138/80 | HR 66 | Ht 71.5 in | Wt 208.7 lb

## 2016-01-25 DIAGNOSIS — N400 Enlarged prostate without lower urinary tract symptoms: Secondary | ICD-10-CM | POA: Diagnosis not present

## 2016-01-25 DIAGNOSIS — N401 Enlarged prostate with lower urinary tract symptoms: Secondary | ICD-10-CM | POA: Diagnosis not present

## 2016-01-25 DIAGNOSIS — N528 Other male erectile dysfunction: Secondary | ICD-10-CM | POA: Diagnosis not present

## 2016-01-25 DIAGNOSIS — N529 Male erectile dysfunction, unspecified: Secondary | ICD-10-CM

## 2016-01-25 DIAGNOSIS — N138 Other obstructive and reflux uropathy: Secondary | ICD-10-CM

## 2016-01-25 LAB — BLADDER SCAN AMB NON-IMAGING: Scan Result: 14

## 2016-01-25 NOTE — Progress Notes (Signed)
10:19 AM   Dwayne Thompson November 19, 1945 [id number]  Referring provider: Jerrol Banana., MD [address] [place], [place] [address]  Chief Complaint  Patient presents with  . Erectile Dysfunction    one month follow up  . Benign Prostatic Hypertrophy    HPI: Patient is 70 year old African-American male who presents today after trial of Stendra 200 mg for ED and Cialis 5 mg daily for BPH with LUTS.    Erectile dysfunction His SHIM score is 11, which is moderate ED.   He has been having difficulty with erections for 2 years.   His major complaint is achieving and maintaining an erection.  His libido is preserved.   His risk factors for ED are age and BPH.   He denies any painful erections or curvatures with his erections.   He found the [place] more efficacious than the Viagra.        SHIM      12/24/15 0948 01/25/16 1009     SHIM: Over the last 6 months:   How do you rate your confidence that you could get and keep an erection? Very Low Low    When you had erections with sexual stimulation, how often were your erections hard enough for penetration (entering your partner)? Almost Never or Never A Few Times (much less than half the time)    During sexual intercourse, how often were you able to maintain your erection after you had penetrated (entered) your partner? Very Difficult Very Difficult    During sexual intercourse, how difficult was it to maintain your erection to completion of intercourse? Very Difficult Difficult    When you attempted sexual intercourse, how often was it satisfactory for you? Very Difficult Very Difficult    SHIM Total Score   SHIM 8 11       Score: 1-7 Severe ED 8-11 Moderate ED 12-16 Mild-Moderate ED 17-21 Mild ED 22-25 No ED  BPH with LUTS He was also found to have significant voiding symptoms.  His IPSS score today is 19, which is moderate lower urinary tract symptomatology. He is unhappy with his quality life due to his  urinary symptoms.  His major complaint today urgency and a weak urinary stream.  He has had these symptoms for 2 years.  He denies any dysuria, hematuria or suprapubic pain.  He currently taking Cardura 4 mg daily, he was not able to fill the Cialis prescription due to insurance issues.   He also denies any recent fevers, chills, nausea or vomiting.  He does not have a family history of PCa.      IPSS      12/24/15 0900 01/25/16 1000     International Prostate Symptom Score   How often have you had the sensation of not emptying your bladder? Not at All Less than half the time    How often have you had to urinate less than every two hours? About half the time About half the time    How often have you found you stopped and started again several times when you urinated? Not at All Less than half the time    How often have you found it difficult to postpone urination? Almost always Almost always    How often have you had a weak urinary stream? About half the time About half the time    How often have you had to strain to start urination? Less than 1 in 5 times Less than  half the time    How many times did you typically get up at night to urinate? 2 Times 2 Times    Total IPSS Score 14 19    Quality of Life due to urinary symptoms   If you were to spend the rest of your life with your urinary condition just the way it is now how would you feel about that? Unhappy Mixed       Score:  1-7 Mild 8-19 Moderate 20-35 Severe   PMH: Past Medical History  Diagnosis Date  . ED (erectile dysfunction)   . Back pain     Surgical History: Past Surgical History  Procedure Laterality Date  . Hernia repair      1980's    Home Medications:    Medication List       This list is accurate as of: 01/25/16 10:19 AM.  Always use your most recent med list.               aspirin 81 MG tablet  Take by mouth.     Cholecalciferol 1000 units tablet  Take by mouth.     doxazosin 4 MG tablet    Commonly known as:  CARDURA  TAKE 1 TABLET DAILY     finasteride 5 MG tablet  Commonly known as:  PROSCAR  Take 1 tablet (5 mg total) by mouth daily.     FISH OIL BURP-LESS 1200 MG Caps  Take by mouth. Reported on 01/25/2016     ibuprofen 800 MG tablet  Commonly known as:  ADVIL,MOTRIN  TAKE 1 TABLET THREE TIMES A DAY AS NEEDED     multivitamin tablet  Take 1 tablet by mouth daily. Reported on 01/25/2016     STENDRA 100 MG Tabs  Generic drug:  Avanafil  Take by mouth.     tadalafil 5 MG tablet  Commonly known as:  CIALIS  Take one tablet daily     VIAGRA 100 MG tablet  Generic drug:  sildenafil  Take by mouth. Reported on 01/25/2016        Allergies:  Allergies  Allergen Reactions  . Tamsulosin Hcl Hives    Family History: Family History  Problem Relation Age of Onset  . Alzheimer's disease Mother   . Sarcoidosis Sister   . Diabetes Brother   . Hypertension Brother   . Anxiety disorder Brother   . Kidney disease Neg Hx   . Prostate cancer Neg Hx     Social History:  reports that he quit smoking about 3 years ago. His smoking use included Cigarettes. He has a 12.5 pack-year smoking history. He has never used smokeless tobacco. He reports that he drinks alcohol. He reports that he does not use illicit drugs.  ROS: UROLOGY Frequent Urination?: No Hard to postpone urination?: Yes Burning/pain with urination?: No Get up at night to urinate?: Yes Leakage of urine?: No Urine stream starts and stops?: Yes Trouble starting stream?: No Do you have to strain to urinate?: No Blood in urine?: No Urinary tract infection?: No Sexually transmitted disease?: No Injury to kidneys or bladder?: No Painful intercourse?: No Weak stream?: Yes Erection problems?: Yes Penile pain?: No  Gastrointestinal Nausea?: No Vomiting?: No Indigestion/heartburn?: No Diarrhea?: No Constipation?: No  Constitutional Fever: No Night sweats?: No Weight loss?: No Fatigue?:  No  Skin Skin rash/lesions?: No Itching?: No  Eyes Blurred vision?: No Double vision?: No  Ears/Nose/Throat Sore throat?: No Sinus problems?: No  Hematologic/Lymphatic Swollen glands?: No Easy bruising?:  No  Cardiovascular Leg swelling?: No Chest pain?: No  Respiratory Cough?: No Shortness of breath?: No  Endocrine Excessive thirst?: No  Musculoskeletal Back pain?: Yes Joint pain?: No  Neurological Headaches?: No Dizziness?: No  Psychologic Depression?: No Anxiety?: No  Physical Exam: BP 138/80 mmHg  Pulse 66  Ht 5' 11.5" (1.816 m)  Wt 208 lb 11.2 oz (94.666 kg)  BMI 28.71 kg/m2  Constitutional: Well nourished. Alert and oriented, No acute distress. HEENT: [place] AT, moist mucus membranes. Trachea midline, no masses. Cardiovascular: No clubbing, cyanosis, or edema. Respiratory: Normal respiratory effort, no increased work of breathing. Skin: No rashes, bruises or suspicious lesions. Lymph: No cervical or inguinal adenopathy. Neurologic: Grossly intact, no focal deficits, moving all 4 extremities. Psychiatric: Normal mood and affect.  Laboratory Data: Lab Results  Component Value Date   WBC 5.5 04/29/2015   HGB 15.5 02/06/2014   HCT 45.9 04/29/2015   MCV 95 04/29/2015   PLT 121* 04/29/2015    Lab Results  Component Value Date   CREATININE 0.85 04/29/2015    Lab Results  Component Value Date   PSA 3.0 10/22/2014  PSA  2.8 ng/mL on 12/24/2015    Component Value Date/Time   CHOL 151 10/18/2013   HDL 53 10/18/2013   LDLCALC 78 10/18/2013    Lab Results  Component Value Date   AST 41* 04/29/2015   Lab Results  Component Value Date   ALT 44 04/29/2015   Pertinent imaging Results for Dwayne Thompson, Dwayne Thompson (MRN [id number]) as of 01/25/2016 13:22  Ref. Range 01/25/2016 10:05  Scan Result Unknown 14   Assessment & Plan:    1. Erectile dysfunction:   SHIM score is 11.  He found the [place] more efficacious than the Viagra. He will like to continue  that medication.  I have given him more samples.  He will RTC in 1 month for SHIM.  2.  BPH with LUTS:   Patient's IPSS score is 19/3.   I have given the patient Cialis 5 mg daily samples.  His wife will bring by the appeal letter so that we may attempt an appeal.  He will discontinue the Cardura at this time.   He will follow up in one month for a PVR and an IPSS.     Return in about 1 month (around 02/25/2016) for SHIM and IPSS.  These notes generated with voice recognition software. I apologize for typographical errors.  [name], [facility] [address], [place], [place] [address] [phone]

## 2016-02-25 ENCOUNTER — Ambulatory Visit: Payer: Medicare Other | Admitting: Urology

## 2016-03-03 ENCOUNTER — Ambulatory Visit (INDEPENDENT_AMBULATORY_CARE_PROVIDER_SITE_OTHER): Payer: Medicare Other | Admitting: Urology

## 2016-03-03 ENCOUNTER — Encounter: Payer: Self-pay | Admitting: Urology

## 2016-03-03 VITALS — BP 143/84 | HR 64 | Ht 71.0 in | Wt 207.5 lb

## 2016-03-03 DIAGNOSIS — N138 Other obstructive and reflux uropathy: Secondary | ICD-10-CM

## 2016-03-03 DIAGNOSIS — N401 Enlarged prostate with lower urinary tract symptoms: Secondary | ICD-10-CM | POA: Diagnosis not present

## 2016-03-03 DIAGNOSIS — N528 Other male erectile dysfunction: Secondary | ICD-10-CM | POA: Diagnosis not present

## 2016-03-03 DIAGNOSIS — N529 Male erectile dysfunction, unspecified: Secondary | ICD-10-CM

## 2016-03-03 MED ORDER — TADALAFIL 5 MG PO TABS: ORAL_TABLET | ORAL | Status: DC

## 2016-03-03 MED ORDER — FINASTERIDE 5 MG PO TABS: 5.0000 mg | ORAL_TABLET | Freq: Every day | ORAL | Status: DC

## 2016-03-03 NOTE — Progress Notes (Signed)
10:26 AM   Dwayne Thompson 19-Dec-1945 [id number]  Referring provider: Jerrol Banana., MD [address] [place], [place] [address]  Chief Complaint  Patient presents with  . Follow-up    HPI: Patient is 70 year old African-American male who presents today after trial a Stendra and Cialis 5 mg daily for ED and BPH with LUTS.    Erectile dysfunction His SHIM score is 14, which is mild-moderate ED.   He has been having difficulty with erections for 2 years.   His major complaint is achieving and maintaining an erection.  His libido is preserved.   His risk factors for ED are age and BPH.   He denies any painful erections or curvatures with his erections.   He found the Stendra efficacious, but he was under the impression that the [place] was for his prostate.        SHIM      01/25/16 1009 03/03/16 1011     SHIM: Over the last 6 months:   How do you rate your confidence that you could get and keep an erection? Low Very Low    When you had erections with sexual stimulation, how often were your erections hard enough for penetration (entering your partner)? A Few Times (much less than half the time) A Few Times (much less than half the time)    During sexual intercourse, how often were you able to maintain your erection after you had penetrated (entered) your partner? Very Difficult Difficult    During sexual intercourse, how difficult was it to maintain your erection to completion of intercourse? Difficult Slightly Difficult    When you attempted sexual intercourse, how often was it satisfactory for you? Very Difficult Slightly Difficult    SHIM Total Score   SHIM 11 14       Score: 1-7 Severe ED 8-11 Moderate ED 12-16 Mild-Moderate ED 17-21 Mild ED 22-25 No ED  BPH with LUTS He was also found to have significant voiding symptoms.  His IPSS score today is 21, which is moderate lower urinary tract symptomatology. He is unhappy with his quality life due to his  urinary symptoms.  His major complaint today urgency and a weak urinary stream.  He has had these symptoms for 2 years.  He denies any dysuria, hematuria or suprapubic pain.  He was given Cialis 5 mg daily samples, but he was under the impression it was for his ED.  He is still taking Cardura.   He also denies any recent fevers, chills, nausea or vomiting.  He does not have a family history of PCa.      IPSS      01/25/16 1000 03/03/16 1000     International Prostate Symptom Score   How often have you had the sensation of not emptying your bladder? Less than half the time Almost always    How often have you had to urinate less than every two hours? About half the time About half the time    How often have you found you stopped and started again several times when you urinated? Less than half the time About half the time    How often have you found it difficult to postpone urination? Almost always Almost always    How often have you had a weak urinary stream? About half the time Less than half the time    How often have you had to strain to start urination? Less than half the  time Less than 1 in 5 times    How many times did you typically get up at night to urinate? 2 Times 2 Times    Total IPSS Score 19 21    Quality of Life due to urinary symptoms   If you were to spend the rest of your life with your urinary condition just the way it is now how would you feel about that? Mixed Mostly Disatisfied       Score:  1-7 Mild 8-19 Moderate 20-35 Severe   PMH: Past Medical History  Diagnosis Date  . ED (erectile dysfunction)   . Back pain     Surgical History: Past Surgical History  Procedure Laterality Date  . Hernia repair      1980's    Home Medications:    Medication List       This list is accurate as of: 03/03/16 10:26 AM.  Always use your most recent med list.               aspirin 81 MG tablet  Take by mouth.     Cholecalciferol 1000 units tablet  Take by  mouth. Reported on 03/03/2016     doxazosin 4 MG tablet  Commonly known as:  CARDURA  TAKE 1 TABLET DAILY     finasteride 5 MG tablet  Commonly known as:  PROSCAR  Take 1 tablet (5 mg total) by mouth daily.     FISH OIL BURP-LESS 1200 MG Caps  Take by mouth. Reported on 01/25/2016     ibuprofen 800 MG tablet  Commonly known as:  ADVIL,MOTRIN  TAKE 1 TABLET THREE TIMES A DAY AS NEEDED     multivitamin tablet  Take 1 tablet by mouth daily. Reported on 03/03/2016     STENDRA 100 MG Tabs  Generic drug:  Avanafil  Take by mouth.     tadalafil 5 MG tablet  Commonly known as:  CIALIS  Take one tablet daily        Allergies:  Allergies  Allergen Reactions  . Tamsulosin Hcl Hives    Family History: Family History  Problem Relation Age of Onset  . Alzheimer's disease Mother   . Sarcoidosis Sister   . Diabetes Brother   . Hypertension Brother   . Anxiety disorder Brother   . Kidney disease Neg Hx   . Prostate cancer Neg Hx     Social History:  reports that he quit smoking about 3 years ago. His smoking use included Cigarettes. He has a 12.5 pack-year smoking history. He has never used smokeless tobacco. He reports that he drinks alcohol. He reports that he does not use illicit drugs.  ROS: UROLOGY Frequent Urination?: No Hard to postpone urination?: No Burning/pain with urination?: No Get up at night to urinate?: Yes Leakage of urine?: No Urine stream starts and stops?: Yes Trouble starting stream?: No Do you have to strain to urinate?: No Blood in urine?: No Urinary tract infection?: No Sexually transmitted disease?: No Injury to kidneys or bladder?: No Painful intercourse?: No Weak stream?: No Erection problems?: Yes Penile pain?: No  Gastrointestinal Nausea?: No Vomiting?: No Indigestion/heartburn?: No Diarrhea?: No Constipation?: No  Constitutional Fever: No Night sweats?: No Weight loss?: No Fatigue?: Yes  Skin Skin rash/lesions?:  No Itching?: No  Eyes Blurred vision?: No Double vision?: No  Ears/Nose/Throat Sore throat?: No Sinus problems?: No  Hematologic/Lymphatic Swollen glands?: No Easy bruising?: No  Cardiovascular Leg swelling?: No Chest pain?: No  Respiratory Cough?: No  Shortness of breath?: No  Endocrine Excessive thirst?: No  Musculoskeletal Back pain?: Yes Joint pain?: No  Neurological Headaches?: No Dizziness?: No  Psychologic Depression?: No Anxiety?: No  Physical Exam: BP 143/84 mmHg  Pulse 64  Ht '5\' 11"'$  (1.803 m)  Wt 207 lb 8 oz (94.121 kg)  BMI 28.95 kg/m2  Constitutional: Well nourished. Alert and oriented, No acute distress. HEENT: [place] AT, moist mucus membranes. Trachea midline, no masses. Cardiovascular: No clubbing, cyanosis, or edema. Respiratory: Normal respiratory effort, no increased work of breathing. Skin: No rashes, bruises or suspicious lesions. Lymph: No cervical or inguinal adenopathy. Neurologic: Grossly intact, no focal deficits, moving all 4 extremities. Psychiatric: Normal mood and affect.  Laboratory Data: Lab Results  Component Value Date   WBC 5.5 04/29/2015   HGB 15.5 02/06/2014   HCT 45.9 04/29/2015   MCV 95 04/29/2015   PLT 121* 04/29/2015    Lab Results  Component Value Date   CREATININE 0.85 04/29/2015    Lab Results  Component Value Date   PSA 3.0 10/22/2014  PSA  2.8 ng/mL on 12/24/2015    Component Value Date/Time   CHOL 151 10/18/2013   HDL 53 10/18/2013   LDLCALC 78 10/18/2013    Lab Results  Component Value Date   AST 41* 04/29/2015   Lab Results  Component Value Date   ALT 44 04/29/2015   Pertinent imaging Results for DEYVI, BONANNO (MRN [id number]) as of 01/25/2016 13:22  Ref. Range 01/25/2016 10:05  Scan Result Unknown 14   Assessment & Plan:    1. Erectile dysfunction:   SHIM score is 14.  He found the [place] more efficacious than the Viagra. He will like to continue that medication.  I have given him  more samples.  He will RTC in 3 month for SHIM.  I have again explained on to take the medication and what the medication is for.    2.  BPH with LUTS:   Patient's IPSS score is 21/4.   I have given the patient Cialis 5 mg daily samples.  The appeal has been conducted and we are expecting a favorable result.    He is also to take the finasteride 5 mg daily.  He will discontinue the Cardura at this time.   He will follow up in three month for a PVR, exam and an IPSS.     Return in about 3 months (around 06/03/2016) for IPSS, SHIM and exam.  These notes generated with voice recognition software. I apologize for typographical errors.  [name], [facility] [address] [place], [place] [address] [phone]

## 2016-03-03 NOTE — Patient Instructions (Addendum)
Tadalafil tablets (Cialis) What is this medicine? TADALAFIL (tah DA la fil) is used to treat erection problems in men. It is also used for enlargement of the prostate gland in men, a condition called benign prostatic hyperplasia or BPH. This medicine improves urine flow and reduces BPH symptoms. This medicine can also treat both erection problems and BPH when they occur together. This medicine may be used for other purposes; ask your health care provider or pharmacist if you have questions. What should I tell my health care provider before I take this medicine? They need to know if you have any of these conditions: -bleeding disorders -eye or vision problems, including a rare inherited eye disease called retinitis pigmentosa -anatomical deformation of the penis, Peyronie's disease, or history of priapism (painful and prolonged erection) -heart disease, angina, a history of heart attack, irregular heart beats, or other heart problems -high or low blood pressure -history of blood diseases, like sickle cell anemia or leukemia -history of stomach bleeding -kidney disease -liver disease -stroke -an unusual or allergic reaction to tadalafil, other medicines, foods, dyes, or preservatives -pregnant or trying to get pregnant -breast-feeding How should I use this medicine? Take this medicine by mouth with a glass of water. Follow the directions on the prescription label. You may take this medicine with or without meals. When this medicine is used for erection problems, your doctor may prescribe it to be taken once daily or as needed. If you are taking the medicine as needed, you may be able to have sexual activity 30 minutes after taking it and for up to 36 hours after taking it. Whether you are taking the medicine as needed or once daily, you should not take more than one dose per day. If you are taking this medicine for symptoms of benign prostatic hyperplasia (BPH) or to treat both BPH and an erection  problem, take the dose once daily at about the same time each day. Do not take your medicine more often than directed. Talk to your pediatrician regarding the use of this medicine in children. Special care may be needed. Overdosage: If you think you have taken too much of this medicine contact a poison control center or emergency room at once. NOTE: This medicine is only for you. Do not share this medicine with others. What if I miss a dose? If you are taking this medicine as needed for erection problems, this does not apply. If you miss a dose while taking this medicine once daily for an erection problem, benign prostatic hyperplasia, or both, take it as soon as you remember, but do not take more than one dose per day. What may interact with this medicine? Do not take this medicine with any of the following medications: -nitrates like amyl nitrite, isosorbide dinitrate, isosorbide mononitrate, nitroglycerin -other medicines for erectile dysfunction like avanafil, sildenafil, vardenafil -other tadalafil products (Adcirca) -riociguat This medicine may also interact with the following medications: -certain drugs for high blood pressure -certain drugs for the treatment of HIV infection or AIDS -certain drugs used for fungal or yeast infections, like fluconazole, itraconazole, ketoconazole, and voriconazole -certain drugs used for seizures like carbamazepine, phenytoin, and phenobarbital -grapefruit juice -macrolide antibiotics like clarithromycin, erythromycin, troleandomycin -medicines for prostate problems -rifabutin, rifampin or rifapentine This list may not describe all possible interactions. Give your health care provider a list of all the medicines, herbs, non-prescription drugs, or dietary supplements you use. Also tell them if you smoke, drink alcohol, or use illegal drugs. Some items  may interact with your medicine. What should I watch for while using this medicine? If you notice any  changes in your vision while taking this drug, call your doctor or health care professional as soon as possible. Stop using this medicine and call your health care provider right away if you have a loss of sight in one or both eyes. Contact your doctor or health care professional right away if the erection lasts longer than 4 hours or if it becomes painful. This may be a sign of serious problem and must be treated right away to prevent permanent damage. If you experience symptoms of nausea, dizziness, chest pain or arm pain upon initiation of sexual activity after taking this medicine, you should refrain from further activity and call your doctor or health care professional as soon as possible. Do not drink alcohol to excess (examples, 5 glasses of wine or 5 shots of whiskey) when taking this medicine. When taken in excess, alcohol can increase your chances of getting a headache or getting dizzy, increasing your heart rate or lowering your blood pressure. Using this medicine does not protect you or your partner against HIV infection (the virus that causes AIDS) or other sexually transmitted diseases. What side effects may I notice from receiving this medicine? Side effects that you should report to your doctor or health care professional as soon as possible: -allergic reactions like skin rash, itching or hives, swelling of the face, lips, or tongue -breathing problems -changes in hearing -changes in vision -chest pain -fast, irregular heartbeat -prolonged or painful erection -seizures Side effects that usually do not require medical attention (report to your doctor or health care professional if they continue or are bothersome): -back pain -dizziness -flushing -headache -indigestion -muscle aches -nausea -stuffy or runny nose This list may not describe all possible side effects. Call your doctor for medical advice about side effects. You may report side effects to FDA at  1-800-FDA-1088. Where should I keep my medicine? Keep out of the reach of children. Store at room temperature between 15 and 30 degrees C (59 and 86 degrees F). Throw away any unused medicine after the expiration date. NOTE: This sheet is a summary. It may not cover all possible information. If you have questions about this medicine, talk to your doctor, pharmacist, or health care provider.    2016, Elsevier/Gold Standard. (2014-01-24 13:15:49) Finasteride (Proscar) tablets What is this medicine? FINASTERIDE (fi NAS teer ide) is used to treat benign prostatic hyperplasia (BPH) in men. This is a condition that causes you to have an enlarged prostate. This medicine helps to control your symptoms, decrease urinary retention, and reduces your risk of needing surgery. When used in combination with certain other medicines, this drug can slow down the progression of your disease. This medicine may be used for other purposes; ask your health care provider or pharmacist if you have questions. What should I tell my health care provider before I take this medicine? They need to know if you have any of these conditions: -liver disease -an unusual or allergic reaction to finasteride, other medicines, foods, dyes, or preservatives -pregnant or trying to get pregnant -breast-feeding How should I use this medicine? Take this medicine by mouth with a glass of water. Follow the directions on the prescription label. You can take this medicine with or without food. Take your doses at regular intervals. Do not take your medicine more often than directed. Do not stop taking except on the advice of your doctor or  health care professional. Talk to your pediatrician regarding the use of this medicine in children. Special care may be needed. Overdosage: If you think you have taken too much of this medicine contact a poison control center or emergency room at once. NOTE: This medicine is only for you. Do not share this  medicine with others. What if I miss a dose? If you miss a dose, take it as soon as you can. If it is almost time for your next dose, take only that dose. Do not take double or extra doses. What may interact with this medicine? -saw palmetto or other dietary supplements This list may not describe all possible interactions. Give your health care provider a list of all the medicines, herbs, non-prescription drugs, or dietary supplements you use. Also tell them if you smoke, drink alcohol, or use illegal drugs. Some items may interact with your medicine. What should I watch for while using this medicine? Do not donate blood while you are taking this medicine. This will prevent giving this medicine to a pregnant male through a blood transfusion. Ask your doctor or health care professional when it is safe to donate blood after you stop taking this medicine. Women who are pregnant or may get pregnant must not handle broken or crushed finasteride tablets. The active ingredient could harm the unborn baby. If a pregnant woman comes into contact with broken or crushed tablets she should check with her doctor or health care professional. Exposure to whole tablets is not expected to cause harm as long as they are not swallowed. Contact your doctor or health care professional if your symptoms do not start to get better. You may need to take this medicine for 6 to 12 months to get the best results. This medicine can interfere with PSA laboratory tests for prostate cancer. If you are scheduled to have a lab test for prostate cancer, tell your doctor or health care professional that you are taking this medicine. This medicine may increase your risk of getting some cancers, like breast cancer. Talk with your doctor. What side effects may I notice from receiving this medicine? Side effects that you should report to your doctor or health care professional as soon as possible: -any signs of an allergic reaction like  rash, itching, hives or swelling of the lips or face -changes in breast like lumps, pain or fluids leaking from the nipple -pain in the testicles Side effects that usually do not require medical attention (report to your doctor or health care professional if they continue or are bothersome): -sexual difficulties like decreased sexual desire or ability to get an erection -small amount of semen released during sex This list may not describe all possible side effects. Call your doctor for medical advice about side effects. You may report side effects to FDA at 1-800-FDA-1088. Where should I keep my medicine? Keep out of the reach of children. Store at room temperature below 30 degrees C (86 degrees F). Protect from light. Keep container tightly closed. Throw away any unused medicine after the expiration date. NOTE: This sheet is a summary. It may not cover all possible information. If you have questions about this medicine, talk to your doctor, pharmacist, or health care provider.    2016, Elsevier/Gold Standard. (2015-04-23 17:24:30) Avanafil oral tablets What is this medicine? AVANAFIL (ah VA Na fil) is used to treat erection problems in men. This medicine may be used for other purposes; ask your health care provider or pharmacist if you have  questions. What should I tell my health care provider before I take this medicine? They need to know if you have any of these conditions:bleeding disorders -eye or vision problems, including a rare inherited eye disease called retinitis pigmentosa -anatomical deformation of the penis, Peyronie's disease, or history of priapism (painful and prolonged erection) -heart disease, angina, a history of heart attack, irregular heart beats, or other heart problems -high or low blood pressure -history of blood diseases, like sickle cell anemia or leukemia -history of stomach bleeding -kidney disease -liver disease -stroke -an unusual or allergic reaction to  avanafil, other medicines, foods, dyes, or preservatives -pregnant or trying to get pregnant -breast-feeding How should I use this medicine? Take this medicine by mouth with a glass of water. Follow the directions on the prescription label. The dose is taken 15 to 30 minutes before sexual activity, depending on the dose you are being prescribed. You should not take the dose more than once per day. Do not take your medicine more often than directed. Talk to your pediatrician regarding the use of this medicine in children. This medicine is not used in children for this condition. Overdosage: If you think you have taken too much of this medicine contact a poison control center or emergency room at once. NOTE: This medicine is only for you. Do not share this medicine with others. What if I miss a dose? This does not apply. Do not take double or extra doses. What may interact with this medicine? Do not take this medicine with any of the following medications: -methscopolamine nitrate -nitrates like amyl nitrite, isosorbide dinitrate, isosorbide mononitrate, nitroglycerin -other medicines for erectile dysfunction like sildenafil, tadalafil, vardenafil -riociguat This medicine may also interact with the following medications: -carbamazepine -certain drugs for high blood pressure -certain drugs for the treatment of HIV infection or AIDS -certain drugs used for fungal or yeast infections, like fluconazole, itraconazole, ketoconazole, and voriconazole -grapefruit juice -macrolide antibiotics like clarithromycin, erythromycin, troleandomycin -medicines for prostate problems -phenobarbital -phenytoin -rifabutin, rifampin or rifapentine -St. John's wort This list may not describe all possible interactions. Give your health care provider a list of all the medicines, herbs, non-prescription drugs, or dietary supplements you use. Also tell them if you smoke, drink alcohol, or use illegal drugs. Some  items may interact with your medicine. What should I watch for while using this medicine? If you notice any changes in your vision while taking this drug, call your doctor or health care professional as soon as possible. Stop using this medicine and call your health care provider right away if you have a loss of sight in one or both eyes. Contact your doctor or health care professional right away if you have an erection that lasts longer than 4 hours or if it becomes painful. This may be a sign of a serious problem and must be treated right away to prevent permanent damage. If you experience symptoms of nausea, dizziness, chest pain or arm pain upon initiation of sexual activity after taking this medicine, you should refrain from further activity and call your doctor or health care professional as soon as possible. Do not drink alcohol to excess (examples, 5 glasses of wine or 5 shots of whiskey) when taking this medicine. When taken in excess, alcohol can increase your chances of getting a headache or getting dizzy, increasing your heart rate or lowering your blood pressure. Using this medicine does not protect you or your partner against HIV infection (the virus that causes AIDS) or  other sexually transmitted diseases. What side effects may I notice from receiving this medicine? Side effects that you should report to your doctor or health care professional as soon as possible: -allergic reactions like skin rash, itching or hives, swelling of the face, lips, or tongue -breathing problems -changes in hearing -changes in vision -chest pain -fast, irregular heartbeat -prolonged or painful erection -seizures Side effects that usually do not require medical attention (report to your doctor or health care professional if they continue or are bothersome): -back pain -dizziness -flushing -headache -indigestion -muscle aches -nausea -stuffy or runny nose This list may not describe all possible side  effects. Call your doctor for medical advice about side effects. You may report side effects to FDA at 1-800-FDA-1088. Where should I keep my medicine? Keep out of the reach of children. Store at room temperature between 20 and 30 degrees C (68 and 86 degrees F). Protect from light. Throw away any unused medicine after the expiration date. NOTE: This sheet is a summary. It may not cover all possible information. If you have questions about this medicine, talk to your doctor, pharmacist, or health care provider.    2016, Elsevier/Gold Standard. (2014-01-24 13:20:28)  Stop the doxazosin (Cardura).  Take the Cialis every day, this is for the prostate.  It may help with erections.  Take the finasteride everyday.  This is for the prostate.  Take [place] Citizen two hours before intercourse on an empty stomach if the Cialis is not helping with erections.

## 2016-03-23 ENCOUNTER — Ambulatory Visit (INDEPENDENT_AMBULATORY_CARE_PROVIDER_SITE_OTHER): Payer: Medicare Other | Admitting: Family Medicine

## 2016-03-23 ENCOUNTER — Encounter: Payer: Self-pay | Admitting: Family Medicine

## 2016-03-23 VITALS — BP 132/72 | HR 68 | Temp 98.3°F | Resp 16 | Wt 210.0 lb

## 2016-03-23 DIAGNOSIS — Z87891 Personal history of nicotine dependence: Secondary | ICD-10-CM

## 2016-03-23 DIAGNOSIS — M542 Cervicalgia: Secondary | ICD-10-CM

## 2016-03-23 DIAGNOSIS — I889 Nonspecific lymphadenitis, unspecified: Secondary | ICD-10-CM | POA: Diagnosis not present

## 2016-03-23 NOTE — Progress Notes (Signed)
Patient: Dwayne Thompson Male    DOB: 1946-02-03   70 y.o.   MRN: [id number] Visit Date: 03/23/2016  Today's Provider: [name], MD   Chief Complaint  Patient presents with  . Adenopathy   Subjective:    HPI Neck Pain: Paitent complains of neck pain. Onset of symptoms 2 1/2 weeks ago, gradually improving since that time. Current symptoms are pain in neck (stabbing in character; 5/10 in severity) and adenopathy. Pain is exacerbated by lateral neck movement. Patient denies stiffness in neck. Patient has had no prior neck problems. Pt is also c/o headaches, which he denies a history of.  No fever or chills.No weight loss.    Allergies  Allergen Reactions  . Tamsulosin Hcl Hives   Current Meds  Medication Sig  . aspirin 81 MG tablet Take by mouth.  . Avanafil (STENDRA) 100 MG TABS Take by mouth.  . Cholecalciferol 1000 UNITS tablet Take by mouth. Reported on 03/03/2016  . doxazosin (CARDURA) 4 MG tablet TAKE 1 TABLET DAILY  . finasteride (PROSCAR) 5 MG tablet Take 1 tablet (5 mg total) by mouth daily.  Marland Kitchen ibuprofen (ADVIL,MOTRIN) 800 MG tablet TAKE 1 TABLET THREE TIMES A DAY AS NEEDED  . Multiple Vitamin (MULTIVITAMIN) tablet Take 1 tablet by mouth daily. Reported on 03/03/2016    Review of Systems  Constitutional: Negative for fever, chills, diaphoresis, activity change, appetite change, fatigue and unexpected weight change.  HENT:       Swollen glands are present  Eyes: Negative.   Respiratory: Negative for shortness of breath.   Endocrine: Negative.   Musculoskeletal: Positive for neck pain. Negative for back pain and neck stiffness.  Allergic/Immunologic: Negative.   Neurological: Positive for headaches.  Psychiatric/Behavioral: Negative.     Social History  Substance Use Topics  . Smoking status: Former Smoker -- 0.50 packs/day for 25 years    Types: Cigarettes    Quit date: 10/20/2012  . Smokeless tobacco: Never Used  . Alcohol Use: Yes     Comment:  daily, 3 to 4 drinks daily, usually gin and chases it down with soda   Objective:   BP 132/72 mmHg  Pulse 68  Temp(Src) 98.3 F (36.8 C) (Oral)  Resp 16  Wt 210 lb (95.255 kg)  Physical Exam  Constitutional: He is oriented to person, place, and time. He appears well-developed and well-nourished.  HENT:  Head: Normocephalic and atraumatic.  Right Ear: Tympanic membrane and external ear normal.  Left Ear: Tympanic membrane and external ear normal.  Nose: Nose normal.  Mouth/Throat: Oropharynx is clear and moist.  Neck: Normal range of motion. Neck supple. No thyromegaly present.  Tenderness in post-cervical right side if neck  Cardiovascular: Normal rate, regular rhythm and normal heart sounds.   Pulmonary/Chest: Effort normal and breath sounds normal. No respiratory distress.  Abdominal: Soft.  Lymphadenopathy:    He has no cervical adenopathy.  Neurological: He is alert and oriented to person, place, and time.  Skin: Skin is warm and dry.  Psychiatric: He has a normal mood and affect. His behavior is normal. Judgment and thought content normal.        Assessment & Plan:     1. Neck pain on right side Can take NSAIDs and use hot compresses PRN for this. This has resolved.  2. History of tobacco abuse Will order chest CT as below. - CT CHEST LUNG CA SCREEN LOW DOSE W/O CM; Future  3. Cervical lymphadenitis  New problem. Will check labs and FU pending results. Will follow clinically. - CBC with Differential/Platelet     Patient seen and examined by [name], MD, and note scribed by Renaldo Fiddler, CMA.   Romell Wolden Cranford Mon, MD  [facility]

## 2016-03-24 LAB — CBC WITH DIFFERENTIAL/PLATELET
Basophils Absolute: 0 10*3/uL (ref 0.0–0.2)
Basos: 0 %
EOS (ABSOLUTE): 0.1 10*3/uL (ref 0.0–0.4)
Eos: 2 %
Hematocrit: 41.1 % (ref 37.5–51.0)
Hemoglobin: 15.1 g/dL (ref 12.6–17.7)
Immature Grans (Abs): 0 10*3/uL (ref 0.0–0.1)
Immature Granulocytes: 0 %
Lymphocytes Absolute: 2 10*3/uL (ref 0.7–3.1)
Lymphs: 33 %
MCH: 32.8 pg (ref 26.6–33.0)
MCHC: 36.7 g/dL — ABNORMAL HIGH (ref 31.5–35.7)
MCV: 89 fL (ref 79–97)
Monocytes Absolute: 0.4 10*3/uL (ref 0.1–0.9)
Monocytes: 7 %
Neutrophils Absolute: 3.6 10*3/uL (ref 1.4–7.0)
Neutrophils: 58 %
Platelets: 117 10*3/uL — ABNORMAL LOW (ref 150–379)
RBC: 4.61 x10E6/uL (ref 4.14–5.80)
RDW: 14.2 % (ref 12.3–15.4)
WBC: 6.2 10*3/uL (ref 3.4–10.8)

## 2016-03-25 ENCOUNTER — Telehealth: Payer: Self-pay | Admitting: *Deleted

## 2016-03-25 NOTE — Telephone Encounter (Signed)
Received referral for low dose lung cancer screening CT scan. Voicemail left at phone number listed in EMR for patient to call me back to facilitate scheduling scan.  

## 2016-04-12 ENCOUNTER — Telehealth: Payer: Self-pay | Admitting: *Deleted

## 2016-04-12 NOTE — Telephone Encounter (Signed)
Received referral for initial lung cancer screening scan. Contacted patient and obtained smoking history,(former, quit 2015, 50 pack year) as well as answering questions related to screening process. Patient denies signs of lung cancer such as weight loss or hemoptysis. Patient denies comorbidity that would prevent curative treatment if lung cancer were found. Patient is tentatively scheduled for shared decision making visit and CT scan on 04/19/16 at 1:30pm, pending insurance approval from business office.

## 2016-04-19 ENCOUNTER — Other Ambulatory Visit: Payer: Self-pay | Admitting: Family Medicine

## 2016-04-19 ENCOUNTER — Other Ambulatory Visit: Payer: Self-pay | Admitting: *Deleted

## 2016-04-19 ENCOUNTER — Encounter: Payer: Self-pay | Admitting: Oncology

## 2016-04-19 ENCOUNTER — Inpatient Hospital Stay: Payer: Medicare Other | Attending: Oncology | Admitting: Oncology

## 2016-04-19 ENCOUNTER — Ambulatory Visit
Admission: RE | Admit: 2016-04-19 | Discharge: 2016-04-19 | Disposition: A | Discharge status: Home / Self Care | Payer: Medicare Other | Source: Ambulatory Visit | Attending: Oncology | Admitting: Oncology

## 2016-04-19 DIAGNOSIS — I700 Atherosclerosis of aorta: Secondary | ICD-10-CM | POA: Diagnosis not present

## 2016-04-19 DIAGNOSIS — Z87891 Personal history of nicotine dependence: Secondary | ICD-10-CM

## 2016-04-19 DIAGNOSIS — J439 Emphysema, unspecified: Secondary | ICD-10-CM | POA: Insufficient documentation

## 2016-04-19 DIAGNOSIS — N200 Calculus of kidney: Secondary | ICD-10-CM | POA: Diagnosis not present

## 2016-04-19 DIAGNOSIS — I2510 Atherosclerotic heart disease of native coronary artery without angina pectoris: Secondary | ICD-10-CM | POA: Insufficient documentation

## 2016-04-20 ENCOUNTER — Telehealth: Payer: Self-pay | Admitting: *Deleted

## 2016-04-20 NOTE — Telephone Encounter (Signed)
POlease make appt for pt to see me in next few months to f/u issues on CT--ie COPD--LIKELY ASYMPTOMATIC.

## 2016-04-20 NOTE — Telephone Encounter (Signed)
Notified patient of LDCT lung cancer screening results with recommendation for 12 month follow up imaging. Also notified of incidental finding noted below. Patient verbalizes understanding.  IMPRESSION: 1. Lung-RADS Category 2, benign appearance or behavior. Continue annual screening with low-dose chest CT without contrast in 12 months 2. Emphysema 3. Aortic atherosclerosis and coronary artery calcification. 4. Nephrolithiasis. 5. 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

## 2016-05-07 NOTE — Progress Notes (Addendum)
In accordance with CMS guidelines, patient has met eligibility criteria including age, absence of signs or symptoms of lung cancer.  Social History  Substance Use Topics  . Smoking status: Former Smoker    Packs/day: 1.00    Years: 50.00    Types: Cigarettes    Quit date: 10/20/2013  . Smokeless tobacco: Never Used  . Alcohol use Yes     Comment: daily, 3 to 4 drinks daily, usually gin and chases it down with soda     A shared decision-making session was conducted prior to the performance of CT scan. This includes one or more decision aids, includes benefits and harms of screening, follow-up diagnostic testing, over-diagnosis, false positive rate, and total radiation exposure.  Counseling on the importance of adherence to annual lung cancer LDCT screening, impact of co-morbidities, and ability or willingness to undergo diagnosis and treatment is imperative for compliance of the program.  Counseling on the importance of continued smoking cessation for former smokers; the importance of smoking cessation for current smokers, and information about tobacco cessation interventions have been given to patient including [place] and [facility] quit [place] programs.  Written order for lung cancer screening with LDCT has been given to the patient and any and all questions have been answered to the best of my abilities.   Yearly follow up will be coordinated by Burgess Estelle, Thoracic Navigator.

## 2016-05-24 ENCOUNTER — Ambulatory Visit (INDEPENDENT_AMBULATORY_CARE_PROVIDER_SITE_OTHER): Payer: Medicare Other | Admitting: Family Medicine

## 2016-05-24 VITALS — BP 142/80 | HR 76 | Temp 97.8°F | Resp 16 | Wt 215.0 lb

## 2016-05-24 DIAGNOSIS — N401 Enlarged prostate with lower urinary tract symptoms: Secondary | ICD-10-CM

## 2016-05-24 DIAGNOSIS — Z87891 Personal history of nicotine dependence: Secondary | ICD-10-CM | POA: Diagnosis not present

## 2016-05-24 DIAGNOSIS — M542 Cervicalgia: Secondary | ICD-10-CM

## 2016-05-24 DIAGNOSIS — R079 Chest pain, unspecified: Secondary | ICD-10-CM | POA: Diagnosis not present

## 2016-05-24 DIAGNOSIS — I889 Nonspecific lymphadenitis, unspecified: Secondary | ICD-10-CM | POA: Diagnosis not present

## 2016-05-24 DIAGNOSIS — N138 Other obstructive and reflux uropathy: Secondary | ICD-10-CM

## 2016-05-24 DIAGNOSIS — N529 Male erectile dysfunction, unspecified: Secondary | ICD-10-CM

## 2016-05-24 DIAGNOSIS — N528 Other male erectile dysfunction: Secondary | ICD-10-CM | POA: Diagnosis not present

## 2016-05-24 NOTE — Progress Notes (Signed)
Dwayne Thompson  MRN: [id number] DOB: 19-Feb-1946  Subjective:  HPI   The patient is a 70 year old male who presents for 6 month follow up of his BPH after seeing urology.  While talking with the patient states that he is on Doxazosin and Finesteride for the BPH and still having nocturia 3-4 times nightly.  However this is somewhat improved from what it was before.  Patient has been also been on Viagra, Cialis and Stendra at various times for his ED without success.  He has had difficulty getting his insurance to pay for any of the medications.  He has not been tried on generic Viagra as far as he knows.    The patient is also complaining of having increased sweats that he wondered if it was coming from the medications.    The patient stated that he had an episode last week where after he woke and got p to have his coffee he noticed pain under the left axillary area with his breathing.  He states it lasted about an hour and he has not had any trouble since.  He did note that he was having increased sweats during this time.  He had not exerted himself at all that morning and had only been out of bed for a while.  He said that after a while he just got busy and ignored it and it got better.  The patient also complains that he is still having some right sided neck pain.  He has had this problem for more than 6 months.  He states it comes and goes but is worse when he looks to the left.  He has not been taking anything for this.    Patient Active Problem List   Diagnosis Date Noted  . Neck pain on right side 03/23/2016  . History of tobacco abuse 03/23/2016  . Cervical lymphadenitis 03/23/2016  . 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 Allergic rhinitis 03/28/2015  . 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 Failure of erection 03/28/2015  . Acid reflux 03/28/2015    . Arthritis, degenerative 03/28/2015  . Change in blood platelet count 03/28/2015  . Temporary cerebral vascular dysfunction 03/28/2015  . Personal history of tobacco use, presenting hazards to health 03/28/2015    Past Medical History:  Diagnosis Date  . Back pain   . ED (erectile dysfunction)     Social History   Social History  . Marital status: Married    Spouse name: [name]  . Number of children: 3  . Years of education: 12   Occupational History  . retired    Social History Main Topics  . Smoking status: Former Smoker    Packs/day: 1.00    Years: 50.00    Types: Cigarettes    Quit date: 10/20/2013  . Smokeless tobacco: Never Used  . Alcohol use Yes     Comment: daily, 3 to 4 drinks daily, usually gin and chases it down with soda  . Drug use: No  . Sexual activity: Not Currently   Other Topics Concern  . Not on file   Social History Narrative  . No narrative on file    Outpatient Encounter Prescriptions as of 05/24/2016  Medication Sig Note  . aspirin 81 MG tablet Take by mouth. 03/28/2015: Received from: [facility]  . Cholecalciferol 1000 UNITS tablet Take by mouth.  Reported on 03/03/2016 03/28/2015: Received from: [facility]  . doxazosin (CARDURA) 4 MG tablet TAKE 1 TABLET DAILY   . finasteride (PROSCAR) 5 MG tablet Take 1 tablet (5 mg total) by mouth daily.   Marland Kitchen ibuprofen (ADVIL,MOTRIN) 800 MG tablet TAKE 1 TABLET THREE TIMES A DAY AS NEEDED 01/25/2016: PRN  . [DISCONTINUED] Avanafil (STENDRA) 100 MG TABS Take by mouth.   . [DISCONTINUED] Multiple Vitamin (MULTIVITAMIN) tablet Take 1 tablet by mouth daily. Reported on 03/03/2016   . [DISCONTINUED] Omega-3 Fatty Acids (FISH OIL BURP-LESS) 1200 MG CAPS Take by mouth. Reported on 03/23/2016 03/28/2015: Received from: [facility]   No facility-administered encounter medications on file as of 05/24/2016.     Allergies  Allergen Reactions  . Tamsulosin Hcl Hives     Review of Systems  Constitutional: Positive for chills. Negative for fever and malaise/fatigue.  Respiratory: Negative for cough, shortness of breath and wheezing.   Cardiovascular: Negative for chest pain, palpitations, orthopnea, claudication, leg swelling and PND.  Neurological: Positive for headaches (slight headaches when he has the neck pain.). Negative for dizziness and weakness.   Objective:  BP (!) 142/80   Pulse 76   Temp 97.8 F (36.6 C) (Oral)   Wt 215 lb (97.5 kg)   BMI 29.57 kg/m   Physical Exam  Constitutional: He is oriented to person, place, and time and well-developed, well-nourished, and in no distress.  HENT:  Head: Normocephalic and atraumatic.  Right Ear: External ear normal.  Left Ear: External ear normal.  Nose: Nose normal.  Mouth/Throat: Oropharynx is clear and moist.  Eyes: Pupils are equal, round, and reactive to light.  Neck: Normal range of motion. Neck supple.  Cardiovascular: Normal rate, regular rhythm and normal heart sounds.   Pulmonary/Chest: Effort normal and breath sounds normal.  Abdominal: Soft. Bowel sounds are normal.  Musculoskeletal: He exhibits no edema.  Neurological: He is alert and oriented to person, place, and time. Gait normal.  Skin: Skin is warm and dry.  Psychiatric: Mood, memory, affect and judgment normal.    Assessment and Plan :   1. Chest pain, unspecified chest pain type Mild bradycardia on EKG, otherwise normal.  Will obtain Met C, follow.Noncardia. - EKG 12-Lead - Comprehensive metabolic panel  2. BPH with obstruction/lower urinary tract symptoms Continue to follow with urology.  3. Erectile dysfunction of organic origin Continue to follow with urology.  4. Neck pain on right side Patient can use his Ibuprofen on a more regular basis.  Will evaluate further with x-ray is not improved.  5. History of tobacco abuse Follow up lung cancer screening CT in 1 year.  6. Cervical  lymphadenitis Resolved  I have done the exam and reviewed the above chart and it is accurate to the best of my knowledge. HPI, Exam and A&P Transcribed under the direction and in the presence of [name]., MD. Electronically Signed: [name], [place]

## 2016-05-25 LAB — COMPREHENSIVE METABOLIC PANEL
ALT: 27 IU/L (ref 0–44)
AST: 26 IU/L (ref 0–40)
Albumin/Globulin Ratio: 1.4 (ref 1.2–2.2)
Albumin: 4.1 g/dL (ref 3.5–4.8)
Alkaline Phosphatase: 59 IU/L (ref 39–117)
BUN/Creatinine Ratio: 11 (ref 10–24)
BUN: 10 mg/dL (ref 8–27)
Bilirubin Total: 0.5 mg/dL (ref 0.0–1.2)
CO2: 23 mmol/L (ref 18–29)
Calcium: 9.2 mg/dL (ref 8.6–10.2)
Chloride: 100 mmol/L (ref 96–106)
Creatinine, Ser: 0.94 mg/dL (ref 0.76–1.27)
GFR calc Af Amer: 95 mL/min/{1.73_m2} (ref >59)
GFR calc non Af Amer: 82 mL/min/{1.73_m2} (ref >59)
Globulin, Total: 2.9 g/dL (ref 1.5–4.5)
Glucose: 99 mg/dL (ref 65–99)
Potassium: 3.5 mmol/L (ref 3.5–5.2)
Sodium: 141 mmol/L (ref 134–144)
Total Protein: 7 g/dL (ref 6.0–8.5)

## 2016-05-26 ENCOUNTER — Telehealth: Payer: Self-pay

## 2016-05-26 NOTE — Telephone Encounter (Signed)
Pt advised.   Thanks,   -Laura  

## 2016-05-26 NOTE — Telephone Encounter (Signed)
-----   Message from Jerrol Banana., MD sent at 05/25/2016 11:23 AM EDT ----- Labs okay.

## 2016-05-29 ENCOUNTER — Other Ambulatory Visit: Payer: Self-pay | Admitting: Family Medicine

## 2016-06-02 ENCOUNTER — Encounter: Payer: Self-pay | Admitting: Urology

## 2016-06-02 ENCOUNTER — Ambulatory Visit (INDEPENDENT_AMBULATORY_CARE_PROVIDER_SITE_OTHER): Payer: Medicare Other | Admitting: Urology

## 2016-06-02 VITALS — BP 147/83 | HR 69 | Ht 71.0 in | Wt 210.8 lb

## 2016-06-02 DIAGNOSIS — N138 Other obstructive and reflux uropathy: Secondary | ICD-10-CM

## 2016-06-02 DIAGNOSIS — N401 Enlarged prostate with lower urinary tract symptoms: Secondary | ICD-10-CM | POA: Diagnosis not present

## 2016-06-02 DIAGNOSIS — N528 Other male erectile dysfunction: Secondary | ICD-10-CM | POA: Diagnosis not present

## 2016-06-02 DIAGNOSIS — N529 Male erectile dysfunction, unspecified: Secondary | ICD-10-CM

## 2016-06-02 MED ORDER — TADALAFIL 5 MG PO TABS: ORAL_TABLET | ORAL | 4 refills | Status: DC

## 2016-06-02 NOTE — Progress Notes (Signed)
1:20 PM   Dwayne Thompson 08/22/1946 [id number]  Referring provider: Jerrol Banana., MD [address] [place],  [address]  Chief Complaint  Patient presents with  . Erectile Dysfunction    3 month follow up  . Benign Prostatic Hypertrophy    HPI: Patient is 70 year old African-American male who presents today for a three month follow up for ED and BPH with LUTS.    Erectile dysfunction His SHIM score is 10, which is moderate ED.   His previous SHIM was 10.  He has been having difficulty with erections for 3 years.   His major complaint is achieving and maintaining an erection.  His libido is preserved.   His risk factors for ED are age and BPH.   He denies any painful erections or curvatures with his erections.   He found the Cialis more effective than the [place], but both medications are cost prohibitive.          SHIM    Row Name 06/02/16 1011         SHIM: Over the last 6 months:   How do you rate your confidence that you could get and keep an erection? Very Low     When you had erections with sexual stimulation, how often were your erections hard enough for penetration (entering your partner)? Almost Never or Never     During sexual intercourse, how often were you able to maintain your erection after you had penetrated (entered) your partner? Very Difficult     During sexual intercourse, how difficult was it to maintain your erection to completion of intercourse? Slightly Difficult     When you attempted sexual intercourse, how often was it satisfactory for you? Very Difficult       SHIM Total Score   SHIM 10        Score: 1-7 Severe ED 8-11 Moderate ED 12-16 Mild-Moderate ED 17-21 Mild ED 22-25 No ED   BPH with LUTS He was also found to have significant voiding symptoms.  His IPSS score today is 11, which is moderate lower urinary tract symptomatology. He is mixed with his quality life due to his urinary symptoms.  His previous IPSS  score was 21/5.  His major complaint today urgency and a weak urinary stream.  He has had these symptoms for 2 years.  He denies any dysuria, hematuria or suprapubic pain.  He felt the Cialis 5 mg daily was more effective than the Cardura for his urinary symptoms.  He is still taking Cardura.   He also denies any recent fevers, chills, nausea or vomiting.  He does not have a family history of PCa.      IPSS    Row Name 06/02/16 1000         International Prostate Symptom Score   How often have you had the sensation of not emptying your bladder? Less than 1 in 5     How often have you had to urinate less than every two hours? Less than half the time     How often have you found you stopped and started again several times when you urinated? Less than 1 in 5 times     How often have you found it difficult to postpone urination? Less than half the time     How often have you had a weak urinary stream? Less than 1 in 5 times     How often have you had to  strain to start urination? Not at All     How many times did you typically get up at night to urinate? 4 Times     Total IPSS Score 11       Quality of Life due to urinary symptoms   If you were to spend the rest of your life with your urinary condition just the way it is now how would you feel about that? Mixed        Score:  1-7 Mild 8-19 Moderate 20-35 Severe   PMH: Past Medical History:  Diagnosis Date  . Back pain   . BPH (benign prostatic hyperplasia)   . ED (erectile dysfunction)     Surgical History: Past Surgical History:  Procedure Laterality Date  . HERNIA REPAIR     1980's    Home Medications:    Medication List       Accurate as of 06/02/16 11:59 PM. Always use your most recent med list.          aspirin 81 MG tablet Take by mouth.   Cholecalciferol 1000 units tablet Take by mouth. Reported on 03/03/2016   doxazosin 4 MG tablet Commonly known as:  CARDURA TAKE 1 TABLET DAILY   finasteride 5 MG  tablet Commonly known as:  PROSCAR Take 1 tablet (5 mg total) by mouth daily.   ibuprofen 800 MG tablet Commonly known as:  ADVIL,MOTRIN TAKE 1 TABLET THREE TIMES A DAY AS NEEDED   tadalafil 5 MG tablet Commonly known as:  CIALIS Take one tablet daily for BPH       Allergies:  Allergies  Allergen Reactions  . Tamsulosin Hcl Hives    Family History: Family History  Problem Relation Age of Onset  . Alzheimer's disease Mother   . Sarcoidosis Sister   . Diabetes Brother   . Hypertension Brother   . Anxiety disorder Brother   . Kidney disease Neg Hx   . Prostate cancer Neg Hx     Social History:  reports that he quit smoking about 2 years ago. His smoking use included Cigarettes. He has a 50.00 pack-year smoking history. He has never used smokeless tobacco. He reports that he drinks alcohol. He reports that he does not use drugs.  ROS: UROLOGY Frequent Urination?: No Hard to postpone urination?: No Burning/pain with urination?: No Get up at night to urinate?: Yes Leakage of urine?: Yes Urine stream starts and stops?: No Trouble starting stream?: No Do you have to strain to urinate?: No Blood in urine?: No Urinary tract infection?: No Sexually transmitted disease?: No Injury to kidneys or bladder?: No Painful intercourse?: No Weak stream?: No Erection problems?: Yes Penile pain?: No  Gastrointestinal Nausea?: No Vomiting?: No Indigestion/heartburn?: No Diarrhea?: No Constipation?: No  Constitutional Fever: No Night sweats?: Yes Weight loss?: No Fatigue?: No  Skin Skin rash/lesions?: No Itching?: No  Eyes Blurred vision?: No Double vision?: No  Ears/Nose/Throat Sore throat?: No Sinus problems?: No  Hematologic/Lymphatic Swollen glands?: Yes Easy bruising?: No  Cardiovascular Leg swelling?: No Chest pain?: No  Respiratory Cough?: No Shortness of breath?: No  Endocrine Excessive thirst?: No  Musculoskeletal Back pain?: Yes Joint  pain?: No  Neurological Headaches?: No Dizziness?: No  Psychologic Depression?: No Anxiety?: No  Physical Exam: BP (!) 147/83   Pulse 69   Ht '5\' 11"'$  (1.803 m)   Wt 210 lb 12.8 oz (95.6 kg)   BMI 29.40 kg/m   Constitutional: Well nourished. Alert and oriented, No acute distress. HEENT:  [place] AT, moist mucus membranes. Trachea midline, no masses. Cardiovascular: No clubbing, cyanosis, or edema. Respiratory: Normal respiratory effort, no increased work of breathing. Skin: No rashes, bruises or suspicious lesions. Lymph: No cervical or inguinal adenopathy. Neurologic: Grossly intact, no focal deficits, moving all 4 extremities. Psychiatric: Normal mood and affect.  Laboratory Data: Lab Results  Component Value Date   WBC 6.2 03/23/2016   HGB 15.5 02/06/2014   HCT 41.1 03/23/2016   MCV 89 03/23/2016   PLT 117 (L) 03/23/2016    Lab Results  Component Value Date   CREATININE 0.94 05/24/2016    Lab Results  Component Value Date   PSA 3.0 10/22/2014  PSA  2.8 ng/mL on 12/24/2015    Component Value Date/Time   CHOL 151 10/18/2013   HDL 53 10/18/2013   LDLCALC 78 10/18/2013    Lab Results  Component Value Date   AST 26 05/24/2016   Lab Results  Component Value Date   ALT 27 05/24/2016   Assessment & Plan:    1. Erectile dysfunction:   SHIM score is 10.  I have given him a written script for the Cialis 5 mg daily so he can shop around for a better price on the medication.  He will RTC in 7 months for SHIM and exam.      2.  BPH with LUTS:   Patient's IPSS score is 11/3.  It is improving.   I have given the patient a script for Cialis 5 mg daily    He is also to take the finasteride 5 mg daily.  He will discontinue the Cardura if he is able to afford the Cialis.  He will follow up in 7 month for a PVR, exam and an IPSS.     Return in about 7 months (around 12/31/2016) for SHIM, IPSS and exam.  These notes generated with voice recognition software. I apologize for  typographical errors.  [name], [facility] [address], [place], [place] [address] [phone]

## 2016-09-02 ENCOUNTER — Ambulatory Visit (INDEPENDENT_AMBULATORY_CARE_PROVIDER_SITE_OTHER): Payer: Medicare Other | Admitting: Family Medicine

## 2016-09-02 DIAGNOSIS — Z23 Encounter for immunization: Secondary | ICD-10-CM

## 2016-11-03 ENCOUNTER — Ambulatory Visit (INDEPENDENT_AMBULATORY_CARE_PROVIDER_SITE_OTHER): Payer: Medicare Other | Admitting: Physician Assistant

## 2016-11-03 ENCOUNTER — Ambulatory Visit
Admission: RE | Admit: 2016-11-03 | Discharge: 2016-11-03 | Disposition: A | Discharge status: Home / Self Care | Payer: Medicare Other | Source: Ambulatory Visit | Attending: Physician Assistant | Admitting: Physician Assistant

## 2016-11-03 ENCOUNTER — Encounter: Payer: Self-pay | Admitting: Physician Assistant

## 2016-11-03 VITALS — BP 136/78 | HR 69 | Temp 98.8°F | Resp 16 | Wt 216.0 lb

## 2016-11-03 DIAGNOSIS — M5030 Other cervical disc degeneration, unspecified cervical region: Secondary | ICD-10-CM | POA: Insufficient documentation

## 2016-11-03 DIAGNOSIS — X58XXXA Exposure to other specified factors, initial encounter: Secondary | ICD-10-CM | POA: Diagnosis not present

## 2016-11-03 DIAGNOSIS — M542 Cervicalgia: Secondary | ICD-10-CM

## 2016-11-03 DIAGNOSIS — S46002A Unspecified injury of muscle(s) and tendon(s) of the rotator cuff of left shoulder, initial encounter: Secondary | ICD-10-CM | POA: Insufficient documentation

## 2016-11-03 DIAGNOSIS — M79602 Pain in left arm: Secondary | ICD-10-CM

## 2016-11-03 NOTE — Patient Instructions (Signed)
Rotator Cuff Injury Rotator cuff injury is any type of injury to the set of muscles and tendons that make up the stabilizing unit of your shoulder. This unit holds the ball of your upper arm bone (humerus) in the socket of your shoulder blade (scapula). What are the causes? Injuries to your rotator cuff most commonly come from sports or activities that cause your arm to be moved repeatedly over your head. Examples of this include throwing, weight lifting, swimming, or racquet sports. Long lasting (chronic) irritation of your rotator cuff can cause soreness and swelling (inflammation), bursitis, and eventual damage to your tendons, such as a tear (rupture). What are the signs or symptoms? Acute rotator cuff tear:  Sudden tearing sensation followed by severe pain shooting from your upper shoulder down your arm toward your elbow.  Decreased range of motion of your shoulder because of pain and muscle spasm.  Severe pain.  Inability to raise your arm out to the side because of pain and loss of muscle power (large tears). Chronic rotator cuff tear:  Pain that usually is worse at night and may interfere with sleep.  Gradual weakness and decreased shoulder motion as the pain worsens.  Decreased range of motion. Rotator cuff tendinitis:  Deep ache in your shoulder and the outside upper arm over your shoulder.  Pain that comes on gradually and becomes worse when lifting your arm to the side or turning it inward. How is this diagnosed? Rotator cuff injury is diagnosed through a medical history, physical exam, and imaging exam. The medical history helps determine the type of rotator cuff injury. Your health care provider will look at your injured shoulder, feel the injured area, and ask you to move your shoulder in different positions. X-ray exams typically are done to rule out other causes of shoulder pain, such as fractures. MRI is the exam of choice for the most severe shoulder injuries because the  images show muscles and tendons. How is this treated? Chronic tear:  Medicine for pain, such as acetaminophen or ibuprofen.  Physical therapy and range-of-motion exercises may be helpful in maintaining shoulder function and strength.  Steroid injections into your shoulder joint.  Surgical repair of the rotator cuff if the injury does not heal with noninvasive treatment. Acute tear:  Anti-inflammatory medicines such as ibuprofen and naproxen to help reduce pain and swelling.  A sling to help support your arm and rest your rotator cuff muscles. Long-term use of a sling is not advised. It may cause significant stiffening of the shoulder joint.  Surgery may be considered within a few weeks, especially in younger, active people, to return the shoulder to full function.  Indications for surgical treatment include the following:  Age younger than 73 years.  Rotator cuff tears that are complete.  Physical therapy, rest, and anti-inflammatory medicines have been used for 6-8 weeks, with no improvement.  Employment or sporting activity that requires constant shoulder use. Tendinitis:  Anti-inflammatory medicines such as ibuprofen and naproxen to help reduce pain and swelling.  A sling to help support your arm and rest your rotator cuff muscles. Long-term use of a sling is not advised. It may cause significant stiffening of the shoulder joint.  Severe tendinitis may require:  Steroid injections into your shoulder joint.  Physical therapy.  Surgery. Follow these instructions at home:  Apply ice to your injury:  Put ice in a plastic bag.  Place a towel between your skin and the bag.  Leave the ice on for  20 minutes, 2-3 times a day.  If you have a shoulder immobilizer (sling and straps), wear it until told otherwise by your health care provider.  You may want to sleep on several pillows or in a recliner at night to lessen swelling and pain.  Only take over-the-counter or  prescription medicines for pain, discomfort, or fever as directed by your health care provider.  Do simple hand squeezing exercises with a soft rubber ball to decrease hand swelling. Contact a health care provider if:  Your shoulder pain increases, or new pain or numbness develops in your arm, hand, or fingers.  Your hand or fingers are colder than your other hand. Get help right away if:  Your arm, hand, or fingers are numb or tingling.  Your arm, hand, or fingers are increasingly swollen and painful, or they turn white or blue. This information is not intended to replace advice given to you by your health care provider. Make sure you discuss any questions you have with your health care provider. Document Released: 09/02/2000 Document Revised: 02/11/2016 Document Reviewed: 04/17/2013 Elsevier Interactive Patient Education  2017 [employer].

## 2016-11-03 NOTE — Progress Notes (Signed)
Patient: Dwayne Thompson Male    DOB: [birth year]   71 y.o.   MRN: [id number] Visit Date: 11/03/2016  Today's Provider: Trinna Post, PA-C   Chief Complaint  Patient presents with  . Arm Pain   Subjective:    Arm Pain   There was no injury mechanism. The quality of the pain is described as aching and shooting. The pain radiates to the left hand. Associated symptoms include muscle weakness. Pertinent negatives include no chest pain, numbness or tingling. The symptoms are aggravated by movement and lifting. He has tried NSAIDs for the symptoms. The treatment provided no relief.   The patient is a 71 y/o man degenerative arthritis and neck pain presenting today for left arm pain ongoing and worsening over the past 2+ months. There is pain in his left shoulder that can be aching and travel down his arm. He notices this pain when he is putting on his jacket and lifting things. 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 He says he mostly uses his right arm/shoulder for this. He has some pain radiating down into is left hand, but no numbness or tingling. No chest pain. Does have some long term neck pain.    Allergies  Allergen Reactions  . Tamsulosin Hcl Hives     Current Outpatient Prescriptions:  .  aspirin 81 MG tablet, Take by mouth., Disp: , Rfl:  .  Cholecalciferol 1000 UNITS tablet, Take by mouth. Reported on 03/03/2016, Disp: , Rfl:  .  doxazosin (CARDURA) 4 MG tablet, TAKE 1 TABLET DAILY, Disp: 90 tablet, Rfl: 3 .  finasteride (PROSCAR) 5 MG tablet, Take 1 tablet (5 mg total) by mouth daily., Disp: 90 tablet, Rfl: 3 .  ibuprofen (ADVIL,MOTRIN) 800 MG tablet, TAKE 1 TABLET THREE TIMES A DAY AS NEEDED, Disp: 270 tablet, Rfl: 2 .  tadalafil (CIALIS) 5 MG tablet, Take one tablet daily for BPH, Disp: 90 tablet, Rfl: 4  Review of Systems  Cardiovascular: Negative for chest pain.  Neurological: Negative for tingling and numbness.    Social History    Substance Use Topics  . Smoking status: Former Smoker    Packs/day: 1.00    Years: 50.00    Types: Cigarettes    Quit date: 10/20/2013  . Smokeless tobacco: Never Used  . Alcohol use Yes     Comment: daily, 3 to 4 drinks daily, usually gin and chases it down with soda   Objective:   BP 136/78 (BP Location: Right Arm, Patient Position: Sitting, Cuff Size: Large)   Pulse 69   Temp 98.8 F (37.1 C)   Resp 16   Wt 216 lb (98 kg)   SpO2 97%   BMI 30.13 kg/m   Physical Exam  Constitutional: He is oriented to person, place, and time. He appears well-developed and well-nourished.  Musculoskeletal:  Right shoulder and arm examination normal. No bony tednerness along spine, no tenderness or spasm in parspinal muscles. Painful abduction beginning at 180 degrees in left shoulder. Painful internal rotation with left shoulder. Belly press and lift off test negative, but there is pain with these and reduced strength against resistance. Hawkins and Neers negative. Sensation grossly intact. Left shoulder strength 5/5. There is pain with palpation along scapular spine but not along humeral head or clavicle. Upper extremities warm and well perfused.   Neurological: He is alert and oriented to person, place, and time.  Skin: Skin is warm  and dry.        Assessment & Plan:     1. Injury of left rotator cuff, initial encounter  Suspect partial tear of left rotator cuff. Will evaluate and refer as below. May continue ibuprofen for treatment.   - DG Cervical Spine Complete; Future - DG Shoulder Left; Future - Ambulatory referral to Physical Therapy - Ambulatory referral to Orthopedic Surgery  2. Neck pain  - DG Cervical Spine Complete; Future - DG Shoulder Left; Future  3. Left arm pain  - DG Cervical Spine Complete; Future - DG Shoulder Left; Future  Return if symptoms worsen or fail to improve.   Patient Instructions  Rotator Cuff Injury Rotator cuff injury is any type of injury to  the set of muscles and tendons that make up the stabilizing unit of your shoulder. This unit holds the ball of your upper arm bone (humerus) in the socket of your shoulder blade (scapula). What are the causes? Injuries to your rotator cuff most commonly come from sports or activities that cause your arm to be moved repeatedly over your head. Examples of this include throwing, weight lifting, swimming, or racquet sports. Long lasting (chronic) irritation of your rotator cuff can cause soreness and swelling (inflammation), bursitis, and eventual damage to your tendons, such as a tear (rupture). What are the signs or symptoms? Acute rotator cuff tear:  Sudden tearing sensation followed by severe pain shooting from your upper shoulder down your arm toward your elbow.  Decreased range of motion of your shoulder because of pain and muscle spasm.  Severe pain.  Inability to raise your arm out to the side because of pain and loss of muscle power (large tears). Chronic rotator cuff tear:  Pain that usually is worse at night and may interfere with sleep.  Gradual weakness and decreased shoulder motion as the pain worsens.  Decreased range of motion. Rotator cuff tendinitis:  Deep ache in your shoulder and the outside upper arm over your shoulder.  Pain that comes on gradually and becomes worse when lifting your arm to the side or turning it inward. How is this diagnosed? Rotator cuff injury is diagnosed through a medical history, physical exam, and imaging exam. The medical history helps determine the type of rotator cuff injury. Your health care provider will look at your injured shoulder, feel the injured area, and ask you to move your shoulder in different positions. X-ray exams typically are done to rule out other causes of shoulder pain, such as fractures. MRI is the exam of choice for the most severe shoulder injuries because the images show muscles and tendons. How is this treated? Chronic  tear:  Medicine for pain, such as acetaminophen or ibuprofen.  Physical therapy and range-of-motion exercises may be helpful in maintaining shoulder function and strength.  Steroid injections into your shoulder joint.  Surgical repair of the rotator cuff if the injury does not heal with noninvasive treatment. Acute tear:  Anti-inflammatory medicines such as ibuprofen and naproxen to help reduce pain and swelling.  A sling to help support your arm and rest your rotator cuff muscles. Long-term use of a sling is not advised. It may cause significant stiffening of the shoulder joint.  Surgery may be considered within a few weeks, especially in younger, active people, to return the shoulder to full function.  Indications for surgical treatment include the following:  Age younger than 31 years.  Rotator cuff tears that are complete.  Physical therapy, rest, and anti-inflammatory medicines  have been used for 6-8 weeks, with no improvement.  Employment or sporting activity that requires constant shoulder use. Tendinitis:  Anti-inflammatory medicines such as ibuprofen and naproxen to help reduce pain and swelling.  A sling to help support your arm and rest your rotator cuff muscles. Long-term use of a sling is not advised. It may cause significant stiffening of the shoulder joint.  Severe tendinitis may require:  Steroid injections into your shoulder joint.  Physical therapy.  Surgery. Follow these instructions at home:  Apply ice to your injury:  Put ice in a plastic bag.  Place a towel between your skin and the bag.  Leave the ice on for 20 minutes, 2-3 times a day.  If you have a shoulder immobilizer (sling and straps), wear it until told otherwise by your health care provider.  You may want to sleep on several pillows or in a recliner at night to lessen swelling and pain.  Only take over-the-counter or prescription medicines for pain, discomfort, or fever as directed  by your health care provider.  Do simple hand squeezing exercises with a soft rubber ball to decrease hand swelling. Contact a health care provider if:  Your shoulder pain increases, or new pain or numbness develops in your arm, hand, or fingers.  Your hand or fingers are colder than your other hand. Get help right away if:  Your arm, hand, or fingers are numb or tingling.  Your arm, hand, or fingers are increasingly swollen and painful, or they turn white or blue. This information is not intended to replace advice given to you by your health care provider. Make sure you discuss any questions you have with your health care provider. Document Released: 09/02/2000 Document Revised: 02/11/2016 Document Reviewed: 04/17/2013 Elsevier Interactive Patient Education  2017 [employer].   The entirety of the information documented in the History of Present Illness, Review of Systems and Physical Exam were personally obtained by me. Portions of this information were initially documented by [facility] and reviewed by me for thoroughness and accuracy.           Trinna Post, PA-C  [facility]

## 2016-11-04 ENCOUNTER — Telehealth: Payer: Self-pay

## 2016-11-04 NOTE — Telephone Encounter (Signed)
-----   Message from Trinna Post, [place] sent at 11/03/2016  4:09 PM EST ----- Some degenerative changes on cervical spine xray. Left shoulder xray is normal. Please proceed with physical therapy and orthopedics referral.

## 2016-11-04 NOTE — Telephone Encounter (Signed)
Patient has been advised please proceed with order for PT and referral for Orthopedics. KW

## 2016-11-28 ENCOUNTER — Ambulatory Visit (INDEPENDENT_AMBULATORY_CARE_PROVIDER_SITE_OTHER): Payer: Medicare Other | Admitting: Family Medicine

## 2016-11-28 ENCOUNTER — Encounter: Payer: Self-pay | Admitting: Family Medicine

## 2016-11-28 VITALS — BP 160/82 | HR 82 | Temp 98.6°F | Resp 14 | Ht 71.5 in | Wt 219.0 lb

## 2016-11-28 DIAGNOSIS — Z0000 Encounter for general adult medical examination without abnormal findings: Secondary | ICD-10-CM

## 2016-11-28 DIAGNOSIS — Z1211 Encounter for screening for malignant neoplasm of colon: Secondary | ICD-10-CM

## 2016-11-28 DIAGNOSIS — M25512 Pain in left shoulder: Secondary | ICD-10-CM

## 2016-11-28 DIAGNOSIS — G8929 Other chronic pain: Secondary | ICD-10-CM | POA: Diagnosis not present

## 2016-11-28 DIAGNOSIS — N400 Enlarged prostate without lower urinary tract symptoms: Secondary | ICD-10-CM

## 2016-11-28 DIAGNOSIS — R030 Elevated blood-pressure reading, without diagnosis of hypertension: Secondary | ICD-10-CM | POA: Diagnosis not present

## 2016-11-28 NOTE — Progress Notes (Signed)
Patient: Dwayne Thompson, Male    DOB: Jan 18, 1946, 71 y.o.   MRN: [id number] Visit Date: 11/28/2016  Today's Provider: [name], MD   Chief Complaint  Patient presents with  . Medicare Wellness   Subjective:   Dwayne Thompson is a 71 y.o. male who presents today for his Subsequent Annual Wellness Visit. He feels well. He reports exercising not at this time, he will be starting physical therapy for his left shoulder. He reports he is sleeping well.  Colonoscopy 01/13/15 repeat in 5 years-2021-tubular adenoma and hyperplastic polyps. Review of Systems  Constitutional: Negative.   HENT: Positive for congestion, postnasal drip and rhinorrhea.   Eyes: Negative.   Respiratory: Positive for cough. Negative for shortness of breath and wheezing.   Cardiovascular: Negative.   Gastrointestinal: Negative.   Endocrine: Negative.   Genitourinary: Negative.   Musculoskeletal: Negative.   Skin: Negative.   Allergic/Immunologic: Negative.   Neurological: Negative.   Hematological: Negative.   Psychiatric/Behavioral: Negative.     Patient Active Problem List   Diagnosis Date Noted  . Neck pain on right side 03/23/2016  . History of tobacco abuse 03/23/2016  . Cervical lymphadenitis 03/23/2016  . 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 Allergic rhinitis 03/28/2015  . 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 Failure of erection 03/28/2015  . Acid reflux 03/28/2015  . Arthritis, degenerative 03/28/2015  . Change in blood platelet count 03/28/2015  . Temporary cerebral vascular dysfunction 03/28/2015  . Personal history of tobacco use, presenting hazards to health 03/28/2015    Social History   Social History  . Marital status: Married    Spouse name: [name]  . Number of children: 3  . Years of education: 12   Occupational History  . retired    Social  History Main Topics  . Smoking status: Former Smoker    Packs/day: 1.00    Years: 50.00    Types: Cigarettes    Quit date: 10/20/2013  . Smokeless tobacco: Never Used  . Alcohol use Yes     Comment: daily, 3 to 4 drinks daily, usually gin and chases it down with soda  . Drug use: No  . Sexual activity: Not Currently   Other Topics Concern  . Not on file   Social History Narrative  . No narrative on file    Past Surgical History:  Procedure Laterality Date  . HERNIA REPAIR     1980's    His family history includes Alzheimer's disease in his mother; Anxiety disorder in his brother; Diabetes in his brother; Hypertension in his brother; Sarcoidosis in his sister.     Outpatient Encounter Prescriptions as of 11/28/2016  Medication Sig Note  . aspirin 81 MG tablet Take by mouth. 03/28/2015: Received from: [facility]  . Cholecalciferol 1000 UNITS tablet Take by mouth. Reported on 03/03/2016 03/28/2015: Received from: [facility]  . doxazosin (CARDURA) 4 MG tablet TAKE 1 TABLET DAILY   . meloxicam (MOBIC) 7.5 MG tablet Take 7.5 mg by mouth 2 (two) times daily.   . [DISCONTINUED] finasteride (PROSCAR) 5 MG tablet Take 1 tablet (5 mg total) by mouth daily.   Marland Kitchen ibuprofen (ADVIL,MOTRIN) 800 MG tablet TAKE 1 TABLET THREE TIMES A DAY AS NEEDED (Patient not taking: Reported on 11/28/2016)   . tadalafil (CIALIS) 5 MG tablet Take one tablet daily for BPH (Patient not taking:  Reported on 11/28/2016)    No facility-administered encounter medications on file as of 11/28/2016.     Allergies  Allergen Reactions  . Tamsulosin Hcl Hives    Patient Care Team: Jerrol Banana., MD as PCP - General (Family Medicine)   Objective:   Vitals:  Vitals:   11/28/16 0917 11/28/16 1009  BP: (!) 164/82 (!) 160/82  Pulse: 82   Resp: 14   Temp: 98.6 F (37 C)   Weight: 219 lb (99.3 kg)   Height: 5' 11.5" (1.816 m)     Physical Exam  Constitutional: He is  oriented to person, place, and time. He appears well-developed and well-nourished.  HENT:  Head: Normocephalic and atraumatic.  Right Ear: External ear normal.  Left Ear: External ear normal.  Mouth/Throat: Oropharynx is clear and moist.  Upper and lower dentures in place.  Eyes: Conjunctivae are normal. Pupils are equal, round, and reactive to light.  Neck: Normal range of motion. Neck supple.  Cardiovascular: Normal rate, regular rhythm, normal heart sounds and intact distal pulses.   No murmur heard. Pulmonary/Chest: Effort normal and breath sounds normal. No respiratory distress. He has no wheezes.  Abdominal: Soft. He exhibits no distension. There is no tenderness.  Genitourinary: Rectum normal, prostate normal and penis normal. Rectal exam shows guaiac negative stool. No penile tenderness.  Musculoskeletal: He exhibits no edema or tenderness.  Neurological: He is alert and oriented to person, place, and time.  Skin: No rash noted. No erythema.  Psychiatric: He has a normal mood and affect. His behavior is normal. Judgment and thought content normal.    Activities of Daily Living In your present state of health, do you have any difficulty performing the following activities: 11/28/2016  Hearing? N  Vision? N  Difficulty concentrating or making decisions? N  Walking or climbing stairs? N  Dressing or bathing? N  Doing errands, shopping? N  Some recent data might be hidden    Fall Risk Assessment Fall Risk  11/28/2016 04/29/2015  Falls in the past year? No No     Depression Screen PHQ 2/9 Scores 11/28/2016 04/29/2015  PHQ - 2 Score 0 0  PHQ- 9 Score 0 -    Cognitive Testing - 6-CIT    Year: 0 4 points  Month: 0 3 points  Memorize "Pia Mau, [address], [place]"  Time (within 1 hour:) 0 3 points  Count backwards from 20: 0 2 4 points  Name months of year: 0 2 4 points  Repeat Address: 0 '2 4 6 8 10 '$ points   Total Score: 6/28  Interpretation : Normal (0-7) Abnormal  (8-28)    Assessment & Plan:     Annual Wellness Visit  Reviewed patient's Family Medical History Reviewed and updated list of patient's medical providers Assessment of cognitive impairment was done Assessed patient's functional ability Established a written schedule for health screening [place] Completed and Reviewed  1. Medicare annual wellness visit, subsequent  2. Benign fibroma of prostate Pt stopped finesteride. 3. Chronic left shoulder pain Seen Dr Mack Guise. Starting PT soon for this issue.  4. Colon cancer screening  5. Elevated blood pressure reading First time this has been an issue for the patient. Will follow up in 3 to 4 months on this.  Discussed health benefits of physical activity, and encouraged him to engage in regular exercise appropriate for his age and condition.   HPI, Exam and A&P transcribed under direction and in the presence of  [name], MD. I have done the exam and reviewed the chart and it is accurate to the best of my knowledge. Development worker, community has been used and  any errors in dictation or transcription are unintentional. [name] M.D. [facility]

## 2016-11-29 ENCOUNTER — Ambulatory Visit: Payer: Medicare Other | Attending: Orthopedic Surgery

## 2016-11-29 DIAGNOSIS — M6281 Muscle weakness (generalized): Secondary | ICD-10-CM

## 2016-11-29 DIAGNOSIS — M25512 Pain in left shoulder: Secondary | ICD-10-CM | POA: Diagnosis not present

## 2016-11-29 NOTE — Therapy (Signed)
[facility] [address] [place], [address] Phone: [phone]   Fax:  [phone]  Physical Therapy Evaluation  Patient Details  Name: Dwayne Thompson MRN: [id number] Date of Birth: 07-26-1946 Referring Provider: Thornton Park  Encounter Date: 11/29/2016      PT End of Session - 11/29/16 1350    Visit Number 1   Number of Visits 17   Date for PT Re-Evaluation 01/27/2017   Authorization Type g codes 1/10   PT Start Time 1300   PT Stop Time 1400   PT Time Calculation (min) 60 min   Activity Tolerance Patient tolerated treatment well   Behavior During Therapy [facility] for tasks assessed/performed      Past Medical History:  Diagnosis Date  . Back pain   . BPH (benign prostatic hyperplasia)   . ED (erectile dysfunction)     Past Surgical History:  Procedure Laterality Date  . HERNIA REPAIR     1980's    There were no vitals filed for this visit.       Subjective Assessment - 11/29/16 1344    Subjective L shoulder pain   Pertinent History Pt arrived with complaints of L shoulder pain that started 2 months ago. No known change in activities with leisure or work which precipitated the onset of pain. No known trauma to shoulder. Describes shoulder pain as "aching." Worst: 9/10 (with internal rotation), best: 0/10, present: 0/10. No change in pain from AM to PM. Pain never wakes him up at night. Pt initially denies history of neck pain but with further questioning reports some mild L posterior neck soreness which started when shoulder pain started. Pain is on the lateral aspect of his left shoulder. He reports some lateral posterior forearm pain and some pain in the dorsal aspect of L hand. He is unsure if the forearm and hand pain is related to the shoulder pain and they don't always occur at the same time. Denies N/T in LUE. He reports no similar symptoms in the past. 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 Aggravating factors: lifting, putting on jacket, rolling onto the L shoulder at night. Easing factors: rest, stop activity. Pt denies weakness but reports that pain is his primary limiting factor. He is now taking meloxicam which was prescribed by his orthopedist. He has a follow-up appointment with orthopedist in May 2018. ROS negative for red flags.    Limitations Lifting   Diagnostic tests Radiographs of L shoulder (WNL) and neck (mid cervical DDD). No history of MRI   Patient Stated Goals Decrease L shoulder pain and improve function at home and work. To be able to lift without pain   Currently in Pain? Yes   Pain Score 0-No pain  Worst: 9/10, Best: 0/10   Pain Location Shoulder   Pain Orientation Left   Pain Descriptors / Indicators Aching   Pain Type Acute pain   Pain Radiating Towards Unsure, possibly radiating pain into L forearm and dorsal hand   Pain Onset More than a month ago   Pain Frequency Intermittent   Aggravating Factors  lifting, putting on jacket, rolling onto the L shoulder at night   Pain Relieving Factors rest, stop activity   Multiple Pain Sites No            [facility] PT Assessment - 11/29/16 1317      Assessment   Medical Diagnosis L shoulder pain  Referring Provider Thornton Park   Onset Date/Surgical Date 09/24/16   Hand Dominance Right   Next MD Visit May 2018 with Dr. Mack Guise   Prior Therapy None     Precautions   Precautions None     Restrictions   Weight Bearing Restrictions No     Balance Screen   Has the patient fallen in the past 6 months No   Has the patient had a decrease in activity level because of a fear of falling?  No   Is the patient reluctant to leave their home because of a fear of falling?  No     Home Ecologist residence   Living Arrangements Spouse/significant other   Available Help at Discharge Family   Type of [place]     Prior Function   Level of  Independence Independent     Cognition   Overall Cognitive Status Within Functional Limits for tasks assessed     Observation/Other Assessments   Other Surveys  Other Surveys   Quick DASH  27.3     Sensation   Additional Comments WNL to light touch throughout bilateral UEs     ROM / Strength   AROM / PROM / Strength AROM;Strength;PROM     AROM   Overall AROM Comments Age appropriate loss of cervical AROM, painless with active, passive, and overpressure in all directions. No reproduction of L shoulder pain. Slight decrease in L cervical rotation noted. Repeated cervical protraction and retraction are painless. Cervical CPA and L UPA with some generalized cervical pain but no reproduction of L shoulder pain   AROM Assessment Site Shoulder;Cervical   Right/Left Shoulder Right;Left   Right Shoulder Flexion 160 Degrees   Right Shoulder ABduction 150 Degrees   Right Shoulder Internal Rotation 60 Degrees   Right Shoulder External Rotation 66 Degrees   Left Shoulder Flexion 120 Degrees  Pain starts at 105 and continues to end range   Left Shoulder ABduction 130 Degrees  Pain at 100, persists to end range   Left Shoulder Internal Rotation 55 Degrees  Painful   Left Shoulder External Rotation 60 Degrees  Painful   Left Shoulder Horizontal ADduction --  WFL and painful     Strength   Overall Strength Comments Unable to assess L low traps due to pain with overhead motion    Strength Assessment Site Shoulder   Right/Left Shoulder Right;Left   Right Shoulder Flexion 5/5   Right Shoulder Extension 5/5   Right Shoulder ABduction 5/5   Right Shoulder Internal Rotation 5/5   Right Shoulder External Rotation 5/5   Right Shoulder Horizontal ABduction 4+/5   Left Shoulder Flexion 5/5   Left Shoulder Extension 5/5   Left Shoulder ABduction 5/5   Left Shoulder Internal Rotation 5/5   Left Shoulder External Rotation 4+/5   Left Shoulder Horizontal ADduction 4-/5     Palpation   Palpation  comment Pain with palpation just inferior and posterior to acromion. Otherwise no pain with palpation around shoulder or neck     Special Tests    Special Tests Rotator Cuff Impingement   Rotator Cuff Impingment tests Neer impingement test;Hawkins- Kennedy test     Neer Impingement test    Findings Positive   Side Left     Hawkins-Kennedy test   Findings Positive   Side Left  PT Education - 11/29/16 1350    Education provided Yes   Education Details HEP issued, plan of care, prognosis   Person(s) Educated Patient   Methods Explanation;Demonstration;Verbal cues;Handout   Comprehension Verbalized understanding;Returned demonstration             PT Long Term Goals - 11/29/16 1353      PT LONG TERM GOAL #1   Title Pt will be independent with HEP in order to improve strength and AROM of L shoulder in order to decrease pain and improve function at home and work.    Time 8   Period Weeks   Status New     PT LONG TERM GOAL #2   Title Pt will demonstrate worse pain as 6/10 on NPRS for L shoulder in order to demonstrate clinically significant reduction in pain   Baseline 11/29/16: 9/10 worse pain   Time 8   Period Weeks   Status New     PT LONG TERM GOAL #3   Title Pt will improve L shoulder AROM/PROM flexion and abduction to within 10 degrees of R shoulder in order to improve function at home   Baseline 11/29/16: L shoulder flexion: 120, abduction: 130 degrees   Time 8   Period Weeks   Status New     PT LONG TERM GOAL #4   Title Pt will be able to complete all dressing (putting on shirts/jackets, L shoulder internal rotation) without reproduction of pain   Time 8   Period Weeks   Status New               Plan - 11/29/16 1351    Clinical Impression Statement Pt is a pleasant 71 yo male referred for L shoulder pain. PT examinaton reveals significantly limitated and painful L shoulder flexion and abduction compared to  RUE. To a lesser degree he is lacking R shoulder ER/ER which are also painful at end range. L shoulder appears grossly strong with the exception of 4+/5 L shoulder ER compared to 5/5 R shoulder ER. Positive Neer and Hawkins-Kennedy tests. Pt has signs and symptoms consistent with impingement which is his referring diagnosis. Quick DASH is 27.3. He will benefit from skilled PT services to address deficits in strength, pain, and range of motion to improve his function at home and work with less pain.    Rehab Potential Good   Clinical Impairments Affecting Rehab Potential Positive: motivation, acuity; Negative: age, work related demands   PT Frequency 2x / week   PT Duration 8 weeks   PT Treatment/Interventions ADLs/Self Care Home Management;Aquatic Therapy;Biofeedback;Cryotherapy;Electrical Stimulation;Iontophoresis '4mg'$ /ml Dexamethasone;Moist Heat;Ultrasound;Traction;Gait training;Therapeutic activities;Therapeutic exercise;Balance training;Neuromuscular re-education;Patient/family education;Manual techniques;Passive range of motion;Dry needling   PT Next Visit Plan Assess [place] and AC joint mobility, scapular mobility. Progress scapular strengthening, pain-free shoulder mobs, shoulder PROM. Consider ionto if appropriate and progress HEP   PT Home Exercise Plan standing/seated scapular retraction   Consulted and Agree with Plan of Care Patient      Patient will benefit from skilled therapeutic intervention in order to improve the following deficits and impairments:  Pain, Decreased range of motion, Decreased strength  Visit Diagnosis: Acute pain of left shoulder - Plan: PT plan of care cert/re-cert  Muscle weakness (generalized) - Plan: PT plan of care cert/re-cert      G-Codes - 08/67/61 1356    Functional Assessment Tool Used (Outpatient Only) quick dash, clinical judgement   Functional Limitation Carrying, moving and handling objects   Carrying, Moving and Handling Objects  Current Status  [phone]) At least 20 percent but less than 40 percent impaired, limited or restricted   Carrying, Moving and Handling Objects Goal Status (D3267) At least 1 percent but less than 20 percent impaired, limited or restricted       Problem List Patient Active Problem List   Diagnosis Date Noted  . Neck pain on right side 03/23/2016  . History of tobacco abuse 03/23/2016  . Cervical lymphadenitis 03/23/2016  . 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 Allergic rhinitis 03/28/2015  . 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 Failure of erection 03/28/2015  . Acid reflux 03/28/2015  . Arthritis, degenerative 03/28/2015  . Change in blood platelet count 03/28/2015  . Temporary cerebral vascular dysfunction 03/28/2015  . Personal history of tobacco use, presenting hazards to health 03/28/2015   Phillips Grout PT, DPT   Huprich,Jason 11/29/2016, 9:26 PM  [facility] [address], [place], [address] Phone: [phone]   Fax:  [phone]  Name: TYR FRANCA MRN: [id number] Date of Birth: 08-10-1946

## 2016-11-29 NOTE — Patient Instructions (Signed)
Scapular Retractions 3 x 10, 3 second hold, BID

## 2016-12-01 ENCOUNTER — Ambulatory Visit: Payer: Medicare Other

## 2016-12-01 DIAGNOSIS — M25512 Pain in left shoulder: Secondary | ICD-10-CM

## 2016-12-01 DIAGNOSIS — M6281 Muscle weakness (generalized): Secondary | ICD-10-CM

## 2016-12-01 NOTE — Therapy (Signed)
[facility] December 12, [address], [place], [address] Phone: [phone]   Fax:  [phone]  Physical Therapy Treatment  Patient Details  Name: Dwayne Thompson MRN: [id number] Date of Birth: [birth year] Referring Provider: Thornton Park  Encounter Date: 12/01/2016      PT End of Session - 12/01/16 0900    Visit Number 2   Number of Visits 17   Date for PT Re-Evaluation Feb 07, 2017   Authorization Type g codes 1/10   PT Start Time 0901   PT Stop Time 0949   PT Time Calculation (min) 48 min   Activity Tolerance Patient tolerated treatment well   Behavior During Therapy [facility] for tasks assessed/performed      Past Medical History:  Diagnosis Date  . Back pain   . BPH (benign prostatic hyperplasia)   . ED (erectile dysfunction)     Past Surgical History:  Procedure Laterality Date  . HERNIA REPAIR     1980's    There were no vitals filed for this visit.      Subjective Assessment - 12/01/16 0902    Subjective L shoulder is sore. I guess it's because I did the exercise he gave me. 4/10 L shoulder pain currently... maybe less. It's not really hurting (mainly sore).    Pertinent History Pt arrived with complaints of L shoulder pain that started 2 months ago. No known change in activities with leisure or work which precipitated the onset of pain. No known trauma to shoulder. Describes shoulder pain as "aching." Worst: 9/10 (with internal rotation), best: 0/10, present: 0/10. No change in pain from AM to PM. Pain never wakes him up at night. Pt initially denies history of neck pain but with further questioning reports some mild L posterior neck soreness which started when shoulder pain started. Pain is on the lateral aspect of his left shoulder. He reports some lateral posterior forearm pain and some pain in the dorsal aspect of L hand. He is unsure if the forearm and hand pain is related to the shoulder pain and they  don't always occur at the same time. Denies N/T in LUE. He reports no similar symptoms in the past. 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 Aggravating factors: lifting, putting on jacket, rolling onto the L shoulder at night. Easing factors: rest, stop activity. Pt denies weakness but reports that pain is his primary limiting factor. He is now taking meloxicam which was prescribed by his orthopedist. He has a follow-up appointment with orthopedist in May 2018. ROS negative for red flags.    Limitations Lifting   Diagnostic tests Radiographs of L shoulder (WNL) and neck (mid cervical DDD). No history of MRI   Patient Stated Goals Decrease L shoulder pain and improve function at home and work. To be able to lift without pain   Currently in Pain? Yes   Pain Score 4    Pain Onset More than a month ago                                 PT Education - 12/01/16 0932    Education provided Yes   Education Details ther-ex, HEP   Person(s) Educated Patient   Methods Explanation;Demonstration;Tactile cues;Verbal cues;Handout   Comprehension Verbalized understanding;Returned demonstration        Objectives  L shoulder AROM  Flexion 119 degrees with  pain  Abduction 71 degrees (most pain)  Manual therapy     Supine L shoulder in 90 degrees abduction: posteerior, inferior, posterior inferior joint mob grade 3    L shoulder AROM:  Flexion 130 degrees,       Abduction 81 degrees       There-ex  Bilateral shoulder ER resisting yellow band 10x3  Reviewed and given as part of his HEP. Pt demonstrated and verbalized understanding.   Bilateral scapular retraction 10x with 5 seconds resisting yellow band   Blood pressure levels obtained secondary to pt mentioning that it is elevated recently, not having medication for it (MD recommended for pt to exercise to help lower levels)  L arm sitting, mechanically taken 161/87, HR 75.   Pt education  on going to ER department if he has signs and symptoms of CVA. Pt demonstrated and verbalized understanding.    Seated thoracic extension over chair 10x3 with 5 second holds to promote thoracic extension mobility and scapular retraction  Standing L pectoralis stretch (L arm low) 30 seconds x 3 to promote scapular retraction   Improved exercise technique, movement at target joints, use of target muscles after mod verbal, visual, tactile cues.     Stiff posterior, inferior, posterior inferior L glenohumeral joint capsule palpated. Improved L shoulder flexion and abduction AROM following manual therapy to promote mobility. Worked on thoracic extension, scapular retraction and ER muscle strengthening exercises to promote ability to raise his L arm with less pain.               PT Long Term Goals - 11/29/16 1353      PT LONG TERM GOAL #1   Title Pt will be independent with HEP in order to improve strength and AROM of L shoulder in order to decrease pain and improve function at home and work.    Time 8   Period Weeks   Status New     PT LONG TERM GOAL #2   Title Pt will demonstrate worse pain as 6/10 on NPRS for L shoulder in order to demonstrate clinically significant reduction in pain   Baseline 11/29/16: 9/10 worse pain   Time 8   Period Weeks   Status New     PT LONG TERM GOAL #3   Title Pt will improve L shoulder AROM/PROM flexion and abduction to within 10 degrees of R shoulder in order to improve function at home   Baseline 11/29/16: L shoulder flexion: 120, abduction: 130 degrees   Time 8   Period Weeks   Status New     PT LONG TERM GOAL #4   Title Pt will be able to complete all dressing (putting on shirts/jackets, L shoulder internal rotation) without reproduction of pain   Time 8   Period Weeks   Status New               Plan - 12/01/16 0900    Clinical Impression Statement Stiff posterior, inferior, posterior inferior L glenohumeral joint capsule  palpated. Improved L shoulder flexion and abduction AROM following manual therapy to promote mobility. Worked on thoracic extension, scapular retraction and ER muscle strengthening exercises to promote ability to raise his L arm with less pain.      Rehab Potential Good   Clinical Impairments Affecting Rehab Potential Positive: motivation, acuity; Negative: age, work related demands   PT Frequency 2x / week   PT Duration 8 weeks   PT Treatment/Interventions ADLs/Self Care Home Management;Aquatic Therapy;Biofeedback;Cryotherapy;Electrical Stimulation;Iontophoresis  $'4mg'o$ /ml Dexamethasone;Moist Heat;Ultrasound;Traction;Gait training;Therapeutic activities;Therapeutic exercise;Balance training;Neuromuscular re-education;Patient/family education;Manual techniques;Passive range of motion;Dry needling   PT Next Visit Plan Assess [place] and AC joint mobility, scapular mobility. Progress scapular strengthening, pain-free shoulder mobs, shoulder PROM. Consider ionto if appropriate and progress HEP   PT Home Exercise Plan standing/seated scapular retraction   Consulted and Agree with Plan of Care Patient      Patient will benefit from skilled therapeutic intervention in order to improve the following deficits and impairments:  Pain, Decreased range of motion, Decreased strength  Visit Diagnosis: Acute pain of left shoulder  Muscle weakness (generalized)     Problem List Patient Active Problem List   Diagnosis Date Noted  . Neck pain on right side 03/23/2016  . History of tobacco abuse 03/23/2016  . Cervical lymphadenitis 03/23/2016  . 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 Allergic rhinitis 03/28/2015  . 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 Failure of erection 03/28/2015  . Acid reflux 03/28/2015  . Arthritis, degenerative 03/28/2015  . Change in blood platelet  count 03/28/2015  . Temporary cerebral vascular dysfunction 03/28/2015  . Personal history of tobacco use, presenting hazards to health 03/28/2015     Joneen Boers PT, DPT   12/01/2016, 10:01 AM  [facility] [address] [place], [address] Phone: [phone]   Fax:  [phone]  Name: Dwayne Thompson MRN: [id number] Date of Birth: Feb 05, 1946

## 2016-12-01 NOTE — Patient Instructions (Signed)
  Resisted External Rotation: in Neutral - Bilateral    Sit or stand, tubing in both hands, elbows at sides, bent to 90, forearms forward. Pinch shoulder blades together and rotate forearms out. Keep elbows at sides.  Repeat _10___ times per set. Do ___3_ sets per session. Do __1__ sessions per day.  http://orth.exer.us/967   Copyright  VHI. All rights reserved

## 2016-12-05 LAB — IFOBT (OCCULT BLOOD): IFOBT: NEGATIVE

## 2016-12-06 ENCOUNTER — Ambulatory Visit: Payer: Medicare Other

## 2016-12-06 DIAGNOSIS — M25512 Pain in left shoulder: Secondary | ICD-10-CM

## 2016-12-06 DIAGNOSIS — M6281 Muscle weakness (generalized): Secondary | ICD-10-CM

## 2016-12-06 NOTE — Therapy (Signed)
[facility] [address] [place], [address] Phone: [phone]   Fax:  [phone]  Physical Therapy Treatment  Patient Details  Name: Dwayne Thompson MRN: [id number] Date of Birth: 1945/12/18 Referring Provider: Thornton Park  Encounter Date: 12/06/2016      PT End of Session - 12/06/16 0902    Visit Number 3   Number of Visits 17   Date for PT Re-Evaluation 02/09/17   Authorization Type g codes 1/10   PT Start Time 0902   PT Stop Time 0946   PT Time Calculation (min) 44 min   Activity Tolerance Patient tolerated treatment well   Behavior During Therapy [facility] for tasks assessed/performed      Past Medical History:  Diagnosis Date  . Back pain   . BPH (benign prostatic hyperplasia)   . ED (erectile dysfunction)     Past Surgical History:  Procedure Laterality Date  . HERNIA REPAIR     1980's    There were no vitals filed for this visit.      Subjective Assessment - 12/06/16 0903    Subjective L shoulder feels more sore. Not bad. 1-2/10 L shoulder pain currently. More sore in the front of his L arm.  Felt a little sore after last session. Not that bad.    Pertinent History Pt arrived with complaints of L shoulder pain that started 2 months ago. No known change in activities with leisure or work which precipitated the onset of pain. No known trauma to shoulder. Describes shoulder pain as "aching." Worst: 9/10 (with internal rotation), best: 0/10, present: 0/10. No change in pain from AM to PM. Pain never wakes him up at night. Pt initially denies history of neck pain but with further questioning reports some mild L posterior neck soreness which started when shoulder pain started. Pain is on the lateral aspect of his left shoulder. He reports some lateral posterior forearm pain and some pain in the dorsal aspect of L hand. He is unsure if the forearm and hand pain is related to the shoulder pain and they  don't always occur at the same time. Denies N/T in LUE. He reports no similar symptoms in the past. 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 Aggravating factors: lifting, putting on jacket, rolling onto the L shoulder at night. Easing factors: rest, stop activity. Pt denies weakness but reports that pain is his primary limiting factor. He is now taking meloxicam which was prescribed by his orthopedist. He has a follow-up appointment with orthopedist in May 2018. ROS negative for red flags.    Limitations Lifting   Diagnostic tests Radiographs of L shoulder (WNL) and neck (mid cervical DDD). No history of MRI   Patient Stated Goals Decrease L shoulder pain and improve function at home and work. To be able to lift without pain   Currently in Pain? Yes   Pain Score 2    Pain Onset More than a month ago                                 PT Education - 12/06/16 0940    Education provided Yes   Education Details ther-ex, HEP   Person(s) Educated Patient   Methods Explanation;Demonstration;Tactile cues;Verbal cues   Comprehension Verbalized understanding;Returned demonstration        Objectives  L shoulder AROM at  start of session             Flexion 122 degrees with pain             Abduction 71 degrees   Manual therapy                Supine L shoulder in 90 degrees abduction: posteerior, inferior, posterior inferior joint mob grade 3   R S/L L scapular mobilization                            L shoulder AROM:      Flexion 138 degrees,                                                              Abduction 86 degrees                             No pain or soreness after manual therapy    There-ex   Ball rolls up the wall 10x5 second for flexion    Then scaption 10x5 seconds  Towel/pillow case slides up the wall for flexion 10x5 seconds    scaption 10x5 seconds    Standing L pectoralis stretch (L arm low) 30  seconds x 3 to promote scapular retraction   Seated thoracic extension over chair 10x with 5 second holds to promote thoracic extension mobility and scapular retraction  Reviewed HEP. Pt demonstrated and verbalized understanding.     Improved exercise technique, movement at target joints, use of target muscles after min to mod verbal, visual, tactile cues.       Improved L shoulder flexion and abduction AROM following manual therapy to promote shoulder joint mobility and scapular mobility. Worked on [employer] exercises afterwards to maintain ROM.            PT Long Term Goals - 11/29/16 1353      PT LONG TERM GOAL #1   Title Pt will be independent with HEP in order to improve strength and AROM of L shoulder in order to decrease pain and improve function at home and work.    Time 8   Period Weeks   Status New     PT LONG TERM GOAL #2   Title Pt will demonstrate worse pain as 6/10 on NPRS for L shoulder in order to demonstrate clinically significant reduction in pain   Baseline 11/29/16: 9/10 worse pain   Time 8   Period Weeks   Status New     PT LONG TERM GOAL #3   Title Pt will improve L shoulder AROM/PROM flexion and abduction to within 10 degrees of R shoulder in order to improve function at home   Baseline 11/29/16: L shoulder flexion: 120, abduction: 130 degrees   Time 8   Period Weeks   Status New     PT LONG TERM GOAL #4   Title Pt will be able to complete all dressing (putting on shirts/jackets, L shoulder internal rotation) without reproduction of pain   Time 8   Period Weeks   Status New               Plan - 12/06/16 0630  Clinical Impression Statement Improved L shoulder flexion and abduction AROM following manual therapy to promote shoulder joint mobility and scapular mobility. Worked on [employer] exercises afterwards to maintain ROM.   Rehab Potential Good   Clinical Impairments Affecting Rehab Potential Positive: motivation, acuity; Negative: age,  work related demands   PT Frequency 2x / week   PT Duration 8 weeks   PT Treatment/Interventions ADLs/Self Care Home Management;Aquatic Therapy;Biofeedback;Cryotherapy;Electrical Stimulation;Iontophoresis '4mg'$ /ml Dexamethasone;Moist Heat;Ultrasound;Traction;Gait training;Therapeutic activities;Therapeutic exercise;Balance training;Neuromuscular re-education;Patient/family education;Manual techniques;Passive range of motion;Dry needling   PT Next Visit Plan Assess [place] and AC joint mobility, scapular mobility. Progress scapular strengthening, pain-free shoulder mobs, shoulder PROM. Consider ionto if appropriate and progress HEP   PT Home Exercise Plan standing/seated scapular retraction   Consulted and Agree with Plan of Care Patient      Patient will benefit from skilled therapeutic intervention in order to improve the following deficits and impairments:  Pain, Decreased range of motion, Decreased strength  Visit Diagnosis: Acute pain of left shoulder  Muscle weakness (generalized)     Problem List Patient Active Problem List   Diagnosis Date Noted  . Neck pain on right side 03/23/2016  . History of tobacco abuse 03/23/2016  . Cervical lymphadenitis 03/23/2016  . 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 Allergic rhinitis 03/28/2015  . 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 Failure of erection 03/28/2015  . Acid reflux 03/28/2015  . Arthritis, degenerative 03/28/2015  . Change in blood platelet count 03/28/2015  . Temporary cerebral vascular dysfunction 03/28/2015  . Personal history of tobacco use, presenting hazards to health 03/28/2015   Joneen Boers PT, DPT   12/06/2016, 12:46 PM  [facility] [address] [place], [address] Phone: [phone]   Fax:  [phone]  Name:  Dwayne Thompson MRN: [id number] Date of Birth: Jan 14, 1946

## 2016-12-06 NOTE — Patient Instructions (Signed)
  Towel shoulder flexion    Slide towel forward comfortably up the wall.   Keep shoulder blade back and down.      Hold for 5 seconds     Repeat 10 times    Perform 3 times daily.    Towel shoulder scaption    Slide towel diagonally comfortably up the wall.   Keep shoulder blade back and down.      Hold for 5 seconds     Repeat 10 times    Perform 3 times daily.

## 2016-12-08 ENCOUNTER — Ambulatory Visit: Payer: Medicare Other

## 2016-12-13 ENCOUNTER — Ambulatory Visit: Payer: Medicare Other

## 2016-12-13 DIAGNOSIS — M6281 Muscle weakness (generalized): Secondary | ICD-10-CM

## 2016-12-13 DIAGNOSIS — M25512 Pain in left shoulder: Secondary | ICD-10-CM | POA: Diagnosis not present

## 2016-12-13 NOTE — Therapy (Signed)
[facility] [address] [place], [address] Phone: [phone]   Fax:  [phone]  Physical Therapy Treatment  Patient Details  Name: Dwayne Thompson MRN: [id number] Date of Birth: November 16, 1945 Referring Provider: Thornton Park  Encounter Date: 12/13/2016      PT End of Session - 12/13/16 0953    Visit Number 4   Number of Visits 17   Date for PT Re-Evaluation Feb 03, 2017   Authorization Type g codes 1/10   PT Start Time 0953   PT Stop Time 1034   PT Time Calculation (min) 41 min   Activity Tolerance Patient tolerated treatment well   Behavior During Therapy [facility] for tasks assessed/performed      Past Medical History:  Diagnosis Date  . Back pain   . BPH (benign prostatic hyperplasia)   . ED (erectile dysfunction)     Past Surgical History:  Procedure Laterality Date  . HERNIA REPAIR     1980's    There were no vitals filed for this visit.      Subjective Assessment - 12/13/16 0954    Subjective L shoulder is fine. I can move a little bit more. 1/10 L shoulder currently. Not really painful.    Pertinent History Pt arrived with complaints of L shoulder pain that started 2 months ago. No known change in activities with leisure or work which precipitated the onset of pain. No known trauma to shoulder. Describes shoulder pain as "aching." Worst: 9/10 (with internal rotation), best: 0/10, present: 0/10. No change in pain from AM to PM. Pain never wakes him up at night. Pt initially denies history of neck pain but with further questioning reports some mild L posterior neck soreness which started when shoulder pain started. Pain is on the lateral aspect of his left shoulder. He reports some lateral posterior forearm pain and some pain in the dorsal aspect of L hand. He is unsure if the forearm and hand pain is related to the shoulder pain and they don't always occur at the same time. Denies N/T in LUE. He  reports no similar symptoms in the past. 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 Aggravating factors: lifting, putting on jacket, rolling onto the L shoulder at night. Easing factors: rest, stop activity. Pt denies weakness but reports that pain is his primary limiting factor. He is now taking meloxicam which was prescribed by his orthopedist. He has a follow-up appointment with orthopedist in May 2018. ROS negative for red flags.    Limitations Lifting   Diagnostic tests Radiographs of L shoulder (WNL) and neck (mid cervical DDD). No history of MRI   Patient Stated Goals Decrease L shoulder pain and improve function at home and work. To be able to lift without pain   Currently in Pain? Yes   Pain Score 1    Pain Onset More than a month ago                                 PT Education - 12/13/16 1019    Education provided Yes   Education Details ther-ex   [employer]) Educated Patient   Methods Explanation;Demonstration;Tactile cues;Verbal cues   Comprehension Verbalized understanding;Returned demonstration        Objectives  L shoulder AROM at start of session Flexion 122 degrees with pain Abduction 84 degrees   Manual therapy  Supine L shoulder in 90 to 100 degrees abduction: posterior, inferior, posterior inferior joint mob grade 3              R S/L L scapular mobilization    L shoulder AROM: Flexion 135 degrees,  Abduction 112 degrees      There-ex   Ball rolls up the wall 10x5 seconds for scaption              Then scaption 10x5 seconds for flexion  Supine shoulder flexion AROM with 5 second holds at end range to promote stretch 10x   Then S/L L shoulder abduction AROM 5 second holds at end range to promote stretch 10x  Prone L scapular retraction 10x2 with 5 second  holds  Standing L pectoralis stretch (L arm low) 30 seconds x 3 to promote scapular retraction    Improved exercise technique, movement at target joints, use of target muscles after min to mod verbal, visual, tactile cues.     Improved L shoulder flexion and scaption AROM following manual therapy to promote shoulder joint and scapular mobility. Decreasing resting shoulder pain at start of session.             PT Long Term Goals - 11/29/16 1353      PT LONG TERM GOAL #1   Title Pt will be independent with HEP in order to improve strength and AROM of L shoulder in order to decrease pain and improve function at home and work.    Time 8   Period Weeks   Status New     PT LONG TERM GOAL #2   Title Pt will demonstrate worse pain as 6/10 on NPRS for L shoulder in order to demonstrate clinically significant reduction in pain   Baseline 11/29/16: 9/10 worse pain   Time 8   Period Weeks   Status New     PT LONG TERM GOAL #3   Title Pt will improve L shoulder AROM/PROM flexion and abduction to within 10 degrees of R shoulder in order to improve function at home   Baseline 11/29/16: L shoulder flexion: 120, abduction: 130 degrees   Time 8   Period Weeks   Status New     PT LONG TERM GOAL #4   Title Pt will be able to complete all dressing (putting on shirts/jackets, L shoulder internal rotation) without reproduction of pain   Time 8   Period Weeks   Status New               Plan - 12/13/16 1019    Clinical Impression Statement Improved L shoulder flexion and scaption AROM following manual therapy to promote shoulder joint and scapular mobility. Decreasing resting shoulder pain at start of session.    Rehab Potential Good   Clinical Impairments Affecting Rehab Potential Positive: motivation, acuity; Negative: age, work related demands   PT Frequency 2x / week   PT Duration 8 weeks   PT Treatment/Interventions ADLs/Self Care Home Management;Aquatic  Therapy;Biofeedback;Cryotherapy;Electrical Stimulation;Iontophoresis '4mg'$ /ml Dexamethasone;Moist Heat;Ultrasound;Traction;Gait training;Therapeutic activities;Therapeutic exercise;Balance training;Neuromuscular re-education;Patient/family education;Manual techniques;Passive range of motion;Dry needling   PT Next Visit Plan Assess [place] and AC joint mobility, scapular mobility. Progress scapular strengthening, pain-free shoulder mobs, shoulder PROM. Consider ionto if appropriate and progress HEP   PT Home Exercise Plan standing/seated scapular retraction   Consulted and Agree with Plan of Care Patient      Patient will benefit from skilled therapeutic intervention in order to improve the following deficits and impairments:  Pain, Decreased range of motion, Decreased strength  Visit Diagnosis: Acute pain of left shoulder  Muscle weakness (generalized)     Problem List Patient Active Problem List   Diagnosis Date Noted  . Neck pain on right side 03/23/2016  . History of tobacco abuse 03/23/2016  . Cervical lymphadenitis 03/23/2016  . 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 Allergic rhinitis 03/28/2015  . 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 Failure of erection 03/28/2015  . Acid reflux 03/28/2015  . Arthritis, degenerative 03/28/2015  . Change in blood platelet count 03/28/2015  . Temporary cerebral vascular dysfunction 03/28/2015  . Personal history of tobacco use, presenting hazards to health 03/28/2015    Joneen Boers PT, DPT   12/13/2016, 1:28 PM  [facility] [address], [place], [address] Phone: [phone]   Fax:  [phone]  Name: ELISHAH ASHMORE MRN: [id number] Date of Birth: 03-Sep-1946

## 2016-12-15 ENCOUNTER — Ambulatory Visit: Payer: Medicare Other

## 2016-12-15 DIAGNOSIS — M25512 Pain in left shoulder: Secondary | ICD-10-CM

## 2016-12-15 DIAGNOSIS — M6281 Muscle weakness (generalized): Secondary | ICD-10-CM

## 2016-12-15 NOTE — Therapy (Signed)
[facility] [address] [place], [address] Phone: [phone]   Fax:  [phone]  Physical Therapy Treatment  Patient Details  Name: Dwayne Thompson MRN: [id number] Date of Birth: 09-08-1946  Referring Provider: Thornton Park  Encounter Date: 12/15/2016      PT End of Session - 12/15/16 1017    Visit Number 5   Number of Visits 17   Date for PT Re-Evaluation 2017/02/08   Authorization Type g codes 5/10   PT Start Time 1017   PT Stop Time 1103   PT Time Calculation (min) 46 min   Activity Tolerance Patient tolerated treatment well   Behavior During Therapy [facility] for tasks assessed/performed      Past Medical History:  Diagnosis Date  . Back pain   . BPH (benign prostatic hyperplasia)   . ED (erectile dysfunction)     Past Surgical History:  Procedure Laterality Date  . HERNIA REPAIR     1980's    There were no vitals filed for this visit.      Subjective Assessment - 12/15/16 1019    Subjective L shoulder feels fine. No pain or discomfort. Just sore when moving his L arm. Raising his L arm is better since starting PT.    Pertinent History Pt arrived with complaints of L shoulder pain that started 2 months ago. No known change in activities with leisure or work which precipitated the onset of pain. No known trauma to shoulder. Describes shoulder pain as "aching." Worst: 9/10 (with internal rotation), best: 0/10, present: 0/10. No change in pain from AM to PM. Pain never wakes him up at night. Pt initially denies history of neck pain but with further questioning reports some mild L posterior neck soreness which started when shoulder pain started. Pain is on the lateral aspect of his left shoulder. He reports some lateral posterior forearm pain and some pain in the dorsal aspect of L hand. He is unsure if the forearm and hand pain is related to the shoulder pain and they don't always occur at the same  time. Denies N/T in LUE. He reports no similar symptoms in the past. 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 Aggravating factors: lifting, putting on jacket, rolling onto the L shoulder at night. Easing factors: rest, stop activity. Pt denies weakness but reports that pain is his primary limiting factor. He is now taking meloxicam which was prescribed by his orthopedist. He has a follow-up appointment with orthopedist in May 2018. ROS negative for red flags.    Limitations Lifting   Diagnostic tests Radiographs of L shoulder (WNL) and neck (mid cervical DDD). No history of MRI   Patient Stated Goals Decrease L shoulder pain and improve function at home and work. To be able to lift without pain   Currently in Pain? No/denies   Pain Score 0-No pain   Pain Onset More than a month ago                                 PT Education - 12/15/16 1104    Education provided Yes   Education Details ther-ex   [employer]) Educated Patient   Methods Explanation;Demonstration;Tactile cues;Verbal cues   Comprehension Verbalized understanding;Returned demonstration        Objectives  L shoulder AROM at start of session Flexion 130degrees with pain Abduction  112 degrees   Manual therapy   Supine L shoulder in 100 to 110 degrees abduction: posterior, posterior inferior joint mob grade 3  Supine STM L pectoralis muscle in about 100 to 110 degrees abduction R S/L L scapular mobilization  L shoulder AROM after manual therapy: Flexion 136degrees,  Abduction 136degrees    Caudal glide to L distal clavicle at [facility] joint L side, grade 3- to 3    L shoulder AROM afterwards: 142 degrees flexion       142 degrees abduction       There-ex   Ball rolls up the wall 10x5 seconds for flexion    Then scaption 10x5 seconds for scaption  Seated L shoulder flexion AROM with gentle manual resistance throughout range 8x to promote strength throughout range Seated L shoulder flexion AROM with gentle manual resistance at end range 5 seconds x 5  Difficulty with ER use at end range.    Improved exercise technique, movement at target joints, use of target muscles after min to mod verbal, visual, tactile cues.     Improved L shoulder flexion, and abduction AROM in standing against gravity after manual therapy to promote L shoulder joint mobility, scapular mobility, STM to decrease L distal pectoralis major muscle tension, and caudal glide mobility to distal clavicle to L AC joint. Pt making gains towards improved ability to raise his L arm. Difficulty with ER muscle activation at end range L shoulder flexion.               PT Long Term Goals - 11/29/16 1353      PT LONG TERM GOAL #1   Title Pt will be independent with HEP in order to improve strength and AROM of L shoulder in order to decrease pain and improve function at home and work.    Time 8   Period Weeks   Status New     PT LONG TERM GOAL #2   Title Pt will demonstrate worse pain as 6/10 on NPRS for L shoulder in order to demonstrate clinically significant reduction in pain   Baseline 11/29/16: 9/10 worse pain   Time 8   Period Weeks   Status New     PT LONG TERM GOAL #3   Title Pt will improve L shoulder AROM/PROM flexion and abduction to within 10 degrees of R shoulder in order to improve function at home   Baseline 11/29/16: L shoulder flexion: 120, abduction: 130 degrees   Time 8   Period Weeks   Status New     PT LONG TERM GOAL #4   Title Pt will be able to complete all dressing (putting on shirts/jackets, L shoulder internal rotation) without reproduction of pain   Time 8   Period Weeks   Status New               Plan - 12/15/16 1105    Clinical Impression Statement Improved L  shoulder flexion, and abduction AROM in standing against gravity after manual therapy to promote L shoulder joint mobility, scapular mobility, STM to decrease L distal pectoralis major muscle tension, and caudal glide mobility to distal clavicle to L AC joint. Pt making gains towards improved ability to raise his L arm. Difficulty with ER muscle activation at end range L shoulder flexion.    Rehab Potential Good   Clinical Impairments Affecting Rehab Potential Positive: motivation, acuity; Negative: age, work related demands   PT Frequency 2x / week   PT Duration 8  weeks   PT Treatment/Interventions ADLs/Self Care Home Management;Aquatic Therapy;Biofeedback;Cryotherapy;Electrical Stimulation;Iontophoresis '4mg'$ /ml Dexamethasone;Moist Heat;Ultrasound;Traction;Gait training;Therapeutic activities;Therapeutic exercise;Balance training;Neuromuscular re-education;Patient/family education;Manual techniques;Passive range of motion;Dry needling   PT Next Visit Plan Assess [place] and AC joint mobility, scapular mobility. Progress scapular strengthening, pain-free shoulder mobs, shoulder PROM. Consider ionto if appropriate and progress HEP   PT Home Exercise Plan standing/seated scapular retraction   Consulted and Agree with Plan of Care Patient      Patient will benefit from skilled therapeutic intervention in order to improve the following deficits and impairments:  Pain, Decreased range of motion, Decreased strength  Visit Diagnosis: Acute pain of left shoulder  Muscle weakness (generalized)     Problem List Patient Active Problem List   Diagnosis Date Noted  . Neck pain on right side 03/23/2016  . History of tobacco abuse 03/23/2016  . Cervical lymphadenitis 03/23/2016  . 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 Allergic rhinitis 03/28/2015  . Benign fibroma of prostate  03/28/2015  . Failure of erection 03/28/2015  . Acid reflux 03/28/2015  . Arthritis, degenerative 03/28/2015  . Change in blood platelet count 03/28/2015  . Temporary cerebral vascular dysfunction 03/28/2015  . Personal history of tobacco use, presenting hazards to health 03/28/2015    Joneen Boers PT, DPT   12/15/2016, 6:50 PM  [facility] [address], [place], [address] Phone: [phone]   Fax:  [phone]  Name: Dwayne Thompson MRN: [id number] Date of Birth: 1946-04-19

## 2016-12-19 ENCOUNTER — Ambulatory Visit: Payer: Medicare Other | Admitting: Physical Therapy

## 2016-12-20 ENCOUNTER — Ambulatory Visit: Payer: Medicare Other | Attending: Orthopedic Surgery

## 2016-12-20 DIAGNOSIS — M25512 Pain in left shoulder: Secondary | ICD-10-CM | POA: Insufficient documentation

## 2016-12-20 DIAGNOSIS — M6281 Muscle weakness (generalized): Secondary | ICD-10-CM | POA: Diagnosis present

## 2016-12-20 NOTE — Therapy (Signed)
[facility] [address] [place], [address] Phone: [phone]   Fax:  [phone]  Physical Therapy Treatment  Patient Details  Name: Dwayne Thompson MRN: [id number] Date of Birth: December 03, 1945 Referring Provider: Thornton Thompson  Encounter Date: 12/20/2016      PT End of Session - 12/20/16 1054    Visit Number 6   Number of Visits 17   Date for PT Re-Evaluation 02-08-2017   Authorization Type g codes 6/10   PT Start Time 1020   PT Stop Time 1105   PT Time Calculation (min) 45 min   Activity Tolerance Patient tolerated treatment well   Behavior During Therapy Dwayne Thompson for tasks assessed/performed      Past Medical History:  Diagnosis Date  . Back pain   . BPH (benign prostatic hyperplasia)   . ED (erectile dysfunction)     Past Surgical History:  Procedure Laterality Date  . HERNIA REPAIR     1980's    There were no vitals filed for this visit.      Subjective Assessment - 12/20/16 1052    Subjective Pt reports slightly more irritated L shoulder pain at this time. Reports 4/10 aching upon arrival. HEP is going well but reports some increased pain with scaption/abduction. No specific questions or concerns currently   Pertinent History Pt arrived with complaints of L shoulder pain that started 2 months ago. No known change in activities with leisure or work which precipitated the onset of pain. No known trauma to shoulder. Describes shoulder pain as "aching." Worst: 9/10 (with internal rotation), best: 0/10, present: 0/10. No change in pain from AM to PM. Pain never wakes him up at night. Pt initially denies history of neck pain but with further questioning reports some mild L posterior neck soreness which started when shoulder pain started. Pain is on the lateral aspect of his left shoulder. He reports some lateral posterior forearm pain and some pain in the dorsal aspect of L hand. He is unsure if the forearm  and hand pain is related to the shoulder pain and they don't always occur at the same time. Denies N/T in LUE. He reports no similar symptoms in the past. 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 Aggravating factors: lifting, putting on jacket, rolling onto the L shoulder at night. Easing factors: rest, stop activity. Pt denies weakness but reports that pain is his primary limiting factor. He is now taking meloxicam which was prescribed by his orthopedist. He has a follow-up appointment with orthopedist in May 2018. ROS negative for red flags.    Limitations Lifting   Diagnostic tests Radiographs of L shoulder (WNL) and neck (mid cervical DDD). No history of MRI   Patient Stated Goals Decrease L shoulder pain and improve function at home and work. To be able to lift without pain   Currently in Pain? Yes   Pain Score 4    Pain Location Shoulder   Pain Orientation Left   Pain Type Acute pain   Pain Onset More than a month ago            Treatment  Manual therapy  Supine L shoulder inferior and posterior mobilization at available end range flexion, grade III, 30s/bout x 3 bouts; Supine L shoulder distraction intermittently throughout mobilizations for pain relief; Supine L shoulder AP mobilizations at 90 abduction and end range ER, grade III, 30s/bout x 3 bouts to  improve ER; Caudal glide to L distal clavicle at Dwayne Thompson joint L side, grade III, 30s/bout x 3 bouts; Supine L shoulder inferior mobilizations at available end range abduction with belt assist, grade III, 30s/bout x 3 bouts to improve abduction;                  Ther-ex UBE 2 min forward/2 min backwards during history for warm-up (2 min unbilled); LUE I, Y, T with 1# hand weight 2 x 10 each; Supine L serratus bunch with manual resistance 3 x 10; Standing flexion wall slides with YTB around wrists and UT lift off at end range 2 x 10; Cold pack applied to L shoulder at end of ther-ex x 5 minutes  (unbilled);                      PT Education - 12/20/16 1053    Education provided Yes   Education Details Posture and technique correction during session, continue HEP   Person(s) Educated Patient   Methods Explanation   Comprehension Verbalized understanding             PT Long Term Goals - 11/29/16 1353      PT LONG TERM GOAL #1   Title Pt will be independent with HEP in order to improve strength and AROM of L shoulder in order to decrease pain and improve function at home and work.    Time 8   Period Weeks   Status New     PT LONG TERM GOAL #2   Title Pt will demonstrate worse pain as 6/10 on NPRS for L shoulder in order to demonstrate clinically significant reduction in pain   Baseline 11/29/16: 9/10 worse pain   Time 8   Period Weeks   Status New     PT LONG TERM GOAL #3   Title Pt will improve L shoulder AROM/PROM flexion and abduction to within 10 degrees of R shoulder in order to improve function at home   Baseline 11/29/16: L shoulder flexion: 120, abduction: 130 degrees   Time 8   Period Weeks   Status New     PT LONG TERM GOAL #4   Title Pt will be able to complete all dressing (putting on shirts/jackets, L shoulder internal rotation) without reproduction of pain   Time 8   Period Weeks   Status New               Plan - 12/20/16 1054    Clinical Impression Statement Pt reports approximate 50% improvement with respect to L shoulder function since starting therapy. Demonstrates improved AAROM following manual interventions on this date. Pt encouraged to continue HEP and follow-up as scheduled.    Rehab Potential Good   Clinical Impairments Affecting Rehab Potential Positive: motivation, acuity; Negative: age, work related demands   PT Frequency 2x / week   PT Duration 8 weeks   PT Treatment/Interventions ADLs/Self Care Home Management;Aquatic Therapy;Biofeedback;Cryotherapy;Electrical Stimulation;Iontophoresis '4mg'$ /ml  Dexamethasone;Moist Heat;Ultrasound;Traction;Gait training;Therapeutic activities;Therapeutic exercise;Balance training;Neuromuscular re-education;Patient/family education;Manual techniques;Passive range of motion;Dry needling   PT Next Visit Plan Progress scapular strengthening, pain-free shoulder mobs, shoulder PROM as needed, progress HEP?   PT Home Exercise Plan standing/seated scapular retraction   Consulted and Agree with Plan of Care Patient      Patient will benefit from skilled therapeutic intervention in order to improve the following deficits and impairments:  Pain, Decreased range of motion, Decreased strength  Visit Diagnosis: Acute pain of left shoulder  Muscle weakness (generalized)     Problem List Patient Active Problem List   Diagnosis Date Noted  . Neck pain on right side 03/23/2016  . History of tobacco abuse 03/23/2016  . Cervical lymphadenitis 03/23/2016  . 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 Allergic rhinitis 03/28/2015  . 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 Failure of erection 03/28/2015  . Acid reflux 03/28/2015  . Arthritis, degenerative 03/28/2015  . Change in blood platelet count 03/28/2015  . Temporary cerebral vascular dysfunction 03/28/2015  . Personal history of tobacco use, presenting hazards to health 03/28/2015   Phillips Grout PT, DPT   Cyriah Childrey 12/20/2016, 11:06 AM  [facility] [address], [place], [address] Phone: [phone]   Fax:  [phone]  Name: Dwayne Thompson MRN: [id number] Date of Birth: 03/27/1946

## 2016-12-22 ENCOUNTER — Ambulatory Visit: Payer: Medicare Other

## 2016-12-22 DIAGNOSIS — M25512 Pain in left shoulder: Secondary | ICD-10-CM | POA: Diagnosis not present

## 2016-12-22 DIAGNOSIS — M6281 Muscle weakness (generalized): Secondary | ICD-10-CM

## 2016-12-22 NOTE — Therapy (Signed)
[facility] [address] [place], [address] Phone: [phone]   Fax:  [phone]  Physical Therapy Treatment  Patient Details  Name: Dwayne Thompson MRN: [id number] Date of Birth: [birth year] Referring Provider: Thornton Park  Encounter Date: 12/22/2016      PT End of Session - 12/22/16 0811    Visit Number 7   Number of Visits 17   Date for PT Re-Evaluation Jan 30, 2017   Authorization Type g codes 7/10   PT Start Time 0805   PT Stop Time 1324   PT Time Calculation (min) 52 min   Activity Tolerance Patient tolerated treatment well   Behavior During Therapy [facility] for tasks assessed/performed      Past Medical History:  Diagnosis Date  . Back pain   . BPH (benign prostatic hyperplasia)   . ED (erectile dysfunction)     Past Surgical History:  Procedure Laterality Date  . HERNIA REPAIR     1980's    There were no vitals filed for this visit.      Subjective Assessment - 12/22/16 0809    Subjective Pt reports increased soreness in L shoulder with movement this morning. Denies pain at rest. Reports that his L shoulder felt well the evening after the last session. He had increased soreness yesterday and then this morning as well. Notes increased difficulty with active scaption and ER.    Pertinent History Pt arrived with complaints of L shoulder pain that started 2 months ago. No known change in activities with leisure or work which precipitated the onset of pain. No known trauma to shoulder. Describes shoulder pain as "aching." Worst: 9/10 (with internal rotation), best: 0/10, present: 0/10. No change in pain from AM to PM. Pain never wakes him up at night. Pt initially denies history of neck pain but with further questioning reports some mild L posterior neck soreness which started when shoulder pain started. Pain is on the lateral aspect of his left shoulder. He reports some lateral posterior forearm pain  and some pain in the dorsal aspect of L hand. He is unsure if the forearm and hand pain is related to the shoulder pain and they don't always occur at the same time. Denies N/T in LUE. He reports no similar symptoms in the past. 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 Aggravating factors: lifting, putting on jacket, rolling onto the L shoulder at night. Easing factors: rest, stop activity. Pt denies weakness but reports that pain is his primary limiting factor. He is now taking meloxicam which was prescribed by his orthopedist. He has a follow-up appointment with orthopedist in May 2018. ROS negative for red flags.    Limitations Lifting   Diagnostic tests Radiographs of L shoulder (WNL) and neck (mid cervical DDD). No history of MRI   Patient Stated Goals Decrease L shoulder pain and improve function at home and work. To be able to lift without pain   Currently in Pain? No/denies              Treatment  Pre-treatment L shoulder AROM measurements: flexion: 144 abduction: 118 ER: 50 IR: 65   Manual therapy  Supine L shoulder inferior and posterior mobilization at available end range flexion, grade  II-III, 30s/bout x 3 bouts; Floppy fish R shoulder mobilizations with intermittent distraction of GH joint while progressing through scaption PROM; Supine L shoulder AP mobilizations at 90 abduction and end range  ER, grade II-III, 30s/bout x 3 bouts to improve ER; Caudal glide to L distal clavicle at [facility] joint L side, grade III, 30s/bout x 3 bouts;  After manual therapy L shoulder AROM measurements: flexion: 162  abduction: 122 ER: 77  IR: 65   Ther-ex Supine L serratus punch with manual resistance 3 x 10; Rhythmic stabilizations at 100 degrees of scaption 30s x 3; Standing L shoulder flexion AROM with mirror feedback for good scapular control  x 10; Seated L shoulder flexion AROM with gentle manual resistance at end range x 10; Standing  flexion wall slides with YTB around wrists and UT lift off at end range x 10, continual cues required; Cold pack applied to L shoulder at end of ther-ex x 5 minutes (unbilled);                  PT Education - 12/22/16 0810    Education provided Yes   Education Details HEP reinforced   Person(s) Educated Patient   Methods Explanation   Comprehension Verbalized understanding             PT Long Term Goals - 11/29/16 1353      PT LONG TERM GOAL #1   Title Pt will be independent with HEP in order to improve strength and AROM of L shoulder in order to decrease pain and improve function at home and work.    Time 8   Period Weeks   Status New     PT LONG TERM GOAL #2   Title Pt will demonstrate worse pain as 6/10 on NPRS for L shoulder in order to demonstrate clinically significant reduction in pain   Baseline 11/29/16: 9/10 worse pain   Time 8   Period Weeks   Status New     PT LONG TERM GOAL #3   Title Pt will improve L shoulder AROM/PROM flexion and abduction to within 10 degrees of R shoulder in order to improve function at home   Baseline 11/29/16: L shoulder flexion: 120, abduction: 130 degrees   Time 8   Period Weeks   Status New     PT LONG TERM GOAL #4   Title Pt will be able to complete all dressing (putting on shirts/jackets, L shoulder internal rotation) without reproduction of pain   Time 8   Period Weeks   Status New               Plan - 12/22/16 6010    Clinical Impression Statement Decreased intensity of manual techniques on this date due to increased soreness. Pt denies any increase in pain during session. Notable improvement in L shoulder AROM following manual techniques. Pt is making excellent progress with therapy. Pt encouraged to continue HEP and follow-up as scheduled.    Rehab Potential Good   Clinical Impairments Affecting Rehab Potential Positive: motivation, acuity; Negative: age, work related demands   PT Frequency 2x / week    PT Duration 8 weeks   PT Treatment/Interventions ADLs/Self Care Home Management;Aquatic Therapy;Biofeedback;Cryotherapy;Electrical Stimulation;Iontophoresis '4mg'$ /ml Dexamethasone;Moist Heat;Ultrasound;Traction;Gait training;Therapeutic activities;Therapeutic exercise;Balance training;Neuromuscular re-education;Patient/family education;Manual techniques;Passive range of motion;Dry needling   PT Next Visit Plan Progress scapular strengthening, pain-free shoulder mobs, shoulder PROM as needed, progress HEP?   PT Home Exercise Plan standing/seated scapular retraction   Consulted and Agree with Plan of Care Patient      Patient will benefit from skilled therapeutic intervention in order to improve the following deficits and impairments:  Pain, Decreased range of motion, Decreased strength  Visit Diagnosis: Acute pain of left shoulder  Muscle weakness (generalized)     Problem List Patient Active Problem List   Diagnosis Date Noted  . Neck pain on right side 03/23/2016  . History of tobacco abuse 03/23/2016  . Cervical lymphadenitis 03/23/2016  . 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 Allergic rhinitis 03/28/2015  . 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 Failure of erection 03/28/2015  . Acid reflux 03/28/2015  . Arthritis, degenerative 03/28/2015  . Change in blood platelet count 03/28/2015  . Temporary cerebral vascular dysfunction 03/28/2015  . Personal history of tobacco use, presenting hazards to health 03/28/2015   Phillips Grout PT, DPT   Allye Hoyos 12/22/2016, 9:00 AM  [facility] [address] [place], [address] Phone: [phone]   Fax:  [phone]  Name: TRYGG MANTZ MRN: [id number] Date of Birth: 21-Dec-1945

## 2016-12-26 ENCOUNTER — Ambulatory Visit: Payer: Medicare Other | Admitting: Physical Therapy

## 2016-12-28 ENCOUNTER — Ambulatory Visit: Payer: Medicare Other

## 2016-12-28 DIAGNOSIS — M25512 Pain in left shoulder: Secondary | ICD-10-CM

## 2016-12-28 DIAGNOSIS — M6281 Muscle weakness (generalized): Secondary | ICD-10-CM

## 2016-12-28 NOTE — Progress Notes (Deleted)
10:03 AM   [name] 07/01/1946 [id number]  Referring provider: Jerrol Banana., MD [address] [place], [place] [address]  No chief complaint on file.   HPI: Patient is 71 year old African-American male who presents today for a three month follow up for ED and BPH with LUTS.    Erectile dysfunction His SHIM score is ***, which is *** ED.   His previous SHIM was 10.  He has been having difficulty with erections for 4 years.   His major complaint is achieving and maintaining an erection.  His libido is preserved.   His risk factors for ED are age and BPH.   He denies any painful erections or curvatures with his erections.   He found the Cialis more effective than the Roann, but both medications are cost prohibitive.        Score: 1-7 Severe ED 8-11 Moderate ED 12-16 Mild-Moderate ED 17-21 Mild ED 22-25 No ED   BPH with LUTS He was also found to have significant voiding symptoms.  His IPSS score today is ***, which is *** lower urinary tract symptomatology. He is *** with his quality life due to his urinary symptoms.  His previous IPSS score was 11/3.  His major complaint today urgency and a weak urinary stream.  He has had these symptoms for 2 years.  He denies any dysuria, hematuria or suprapubic pain.  He felt the Cialis 5 mg daily was more effective than the Cardura for his urinary symptoms.  He is still taking Cardura.   He also denies any recent fevers, chills, nausea or vomiting.  He does not have a family history of PCa.    Score:  1-7 Mild 8-19 Moderate 20-35 Severe   PMH: Past Medical History:  Diagnosis Date  . Back pain   . BPH (benign prostatic hyperplasia)   . ED (erectile dysfunction)     Surgical History: Past Surgical History:  Procedure Laterality Date  . HERNIA REPAIR     1980's    Home Medications:  Allergies as of 12/29/2016      Reactions   Tamsulosin Hcl Hives      Medication List       Accurate as of  12/28/16 10:03 AM. Always use your most recent med list.          aspirin 81 MG tablet Take by mouth.   Cholecalciferol 1000 units tablet Take by mouth. Reported on 03/03/2016   doxazosin 4 MG tablet Commonly known as:  CARDURA TAKE 1 TABLET DAILY   ibuprofen 800 MG tablet Commonly known as:  ADVIL,MOTRIN TAKE 1 TABLET THREE TIMES A DAY AS NEEDED   meloxicam 7.5 MG tablet Commonly known as:  MOBIC Take 7.5 mg by mouth 2 (two) times daily.   tadalafil 5 MG tablet Commonly known as:  CIALIS Take one tablet daily for BPH       Allergies:  Allergies  Allergen Reactions  . Tamsulosin Hcl Hives    Family History: Family History  Problem Relation Age of Onset  . Alzheimer's disease Mother   . Sarcoidosis Sister   . Diabetes Brother   . Hypertension Brother   . Anxiety disorder Brother   . Kidney disease Neg Hx   . Prostate cancer Neg Hx     Social History:  reports that he quit smoking about 3 years ago. His smoking use included Cigarettes. He has a 50.00 pack-year smoking history. He has never used smokeless  tobacco. He reports that he drinks alcohol. He reports that he does not use drugs.  ROS:                                        Physical Exam: There were no vitals taken for this visit.  Constitutional: Well nourished. Alert and oriented, No acute distress. HEENT: [place] AT, moist mucus membranes. Trachea midline, no masses. Cardiovascular: No clubbing, cyanosis, or edema. Respiratory: Normal respiratory effort, no increased work of breathing. GI: Abdomen is soft, non tender, non distended, no abdominal masses. Liver and spleen not palpable.  No hernias appreciated.  Stool sample for occult testing is not indicated.   GU: No CVA tenderness.  No bladder fullness or masses.  Patient with circumcised/uncircumcised phallus. ***Foreskin easily retracted***  Urethral meatus is patent.  No penile discharge. No penile lesions or rashes. Scrotum  without lesions, cysts, rashes and/or edema.  Testicles are located scrotally bilaterally. No masses are appreciated in the testicles. Left and right epididymis are normal. Rectal: Patient with  normal sphincter tone. Anus and perineum without scarring or rashes. No rectal masses are appreciated. Prostate is approximately *** grams, *** nodules are appreciated. Seminal vesicles are normal. Skin: No rashes, bruises or suspicious lesions. Lymph: No cervical or inguinal adenopathy. Neurologic: Grossly intact, no focal deficits, moving all 4 extremities. Psychiatric: Normal mood and affect.   Laboratory Data: Lab Results  Component Value Date   WBC 6.2 03/23/2016   HGB 15.5 02/06/2014   HCT 41.1 03/23/2016   MCV 89 03/23/2016   PLT 117 (L) 03/23/2016    Lab Results  Component Value Date   CREATININE 0.94 05/24/2016    Lab Results  Component Value Date   PSA 3.0 10/22/2014  PSA  2.8 ng/mL on 12/24/2015    Component Value Date/Time   CHOL 151 10/18/2013   HDL 53 10/18/2013   LDLCALC 78 10/18/2013    Lab Results  Component Value Date   AST 26 05/24/2016   Lab Results  Component Value Date   ALT 27 05/24/2016   Assessment & Plan:    1. Erectile dysfunction:   SHIM score is 10.  I have given him a written script for the Cialis 5 mg daily so he can shop around for a better price on the medication.  He will RTC in 7 months for SHIM and exam.      2.  BPH with LUTS:   Patient's IPSS score is 11/3.  It is improving.   I have given the patient a script for Cialis 5 mg daily    He is also to take the finasteride 5 mg daily.  He will discontinue the Cardura if he is able to afford the Cialis.  He will follow up in 7 month for a PVR, exam and an IPSS.     No Follow-up on file.  These notes generated with voice recognition software. I apologize for typographical errors.  [name], [facility] [address] [place], [place]  [address] [phone]

## 2016-12-28 NOTE — Therapy (Signed)
[facility] [address] [place], [address] Phone: [phone]   Fax:  [phone]  Physical Therapy Treatment  Patient Details  Name: Dwayne Thompson MRN: [id number] Date of Birth: 04/25/1946 Referring Provider: Thornton Park  Encounter Date: 12/28/2016      PT End of Session - 12/28/16 1044    Visit Number 8   Number of Visits 17   Date for PT Re-Evaluation 01-28-17   Authorization Type g codes 8/10   PT Start Time 3329   PT Stop Time 1115   PT Time Calculation (min) 40 min   Activity Tolerance Patient tolerated treatment well   Behavior During Therapy [facility] for tasks assessed/performed      Past Medical History:  Diagnosis Date  . Back pain   . BPH (benign prostatic hyperplasia)   . ED (erectile dysfunction)     Past Surgical History:  Procedure Laterality Date  . HERNIA REPAIR     1980's    There were no vitals filed for this visit.      Subjective Assessment - 12/28/16 1043    Subjective Pt reports continued soreness with L shoulder abduction. Otherwise no pain reported with forward flexion. No specific questions upon arrival. Performing HEP without issue.    Pertinent History Pt arrived with complaints of L shoulder pain that started 2 months ago. No known change in activities with leisure or work which precipitated the onset of pain. No known trauma to shoulder. Describes shoulder pain as "aching." Worst: 9/10 (with internal rotation), best: 0/10, present: 0/10. No change in pain from AM to PM. Pain never wakes him up at night. Pt initially denies history of neck pain but with further questioning reports some mild L posterior neck soreness which started when shoulder pain started. Pain is on the lateral aspect of his left shoulder. He reports some lateral posterior forearm pain and some pain in the dorsal aspect of L hand. He is unsure if the forearm and hand pain is related to the shoulder  pain and they don't always occur at the same time. Denies N/T in LUE. He reports no similar symptoms in the past. 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 Aggravating factors: lifting, putting on jacket, rolling onto the L shoulder at night. Easing factors: rest, stop activity. Pt denies weakness but reports that pain is his primary limiting factor. He is now taking meloxicam which was prescribed by his orthopedist. He has a follow-up appointment with orthopedist in May 2018. ROS negative for red flags.    Limitations Lifting   Diagnostic tests Radiographs of L shoulder (WNL) and neck (mid cervical DDD). No history of MRI   Patient Stated Goals Decrease L shoulder pain and improve function at home and work. To be able to lift without pain   Currently in Pain? Yes   Pain Score 2    Pain Location Shoulder   Pain Orientation Left   Pain Descriptors / Indicators Sore   Pain Type Chronic pain   Pain Frequency Intermittent          Treatment   Manual therapy  PROM R shoulder flexion, abduction, and ER. Pt with limited abduction reporting pain around 110, and pain with ER around 70, no notable pain with flexion; Supine L shoulderAP mobilization at neutral grade III, 30s/bout x 4 bouts; Floppy fish R shoulder mobilizations with intermittent distraction of GH joint while progressing through scaption  PROM; Attempted supine L shoulder inferior mobilizations at 90 abduction however pt cannot tolerate secondary to pain; R sidelying L scapular upward rotation mobilizations grade III, 20s/bout x 3 bouts;  Ther-ex Supine L serratus punch with manual resistance 2 x 10; Rhythmic stabilizations at 100 degrees of scaption 30s x 2; Isometric L shoulder abduction 5s hold x 10 with arm at side; Isometric L shoulder ER 5s hold x 10 with arm at side; Isometric L shoulder abduction 5s hold x 10 with arm at 100 scaption Isometric L shoulder ER 5s hold x 10 with arm at side at  100 scaption; Standing L shoulder ball rolls on wall at shoulder height x 20 CW/CCW;                      PT Education - 12/28/16 1044    Education provided Yes   Education Details HEP reinforced    Person(s) Educated Patient   Methods Explanation   Comprehension Verbalized understanding             PT Long Term Goals - 11/29/16 1353      PT LONG TERM GOAL #1   Title Pt will be independent with HEP in order to improve strength and AROM of L shoulder in order to decrease pain and improve function at home and work.    Time 8   Period Weeks   Status New     PT LONG TERM GOAL #2   Title Pt will demonstrate worse pain as 6/10 on NPRS for L shoulder in order to demonstrate clinically significant reduction in pain   Baseline 11/29/16: 9/10 worse pain   Time 8   Period Weeks   Status New     PT LONG TERM GOAL #3   Title Pt will improve L shoulder AROM/PROM flexion and abduction to within 10 degrees of R shoulder in order to improve function at home   Baseline 11/29/16: L shoulder flexion: 120, abduction: 130 degrees   Time 8   Period Weeks   Status New     PT LONG TERM GOAL #4   Title Pt will be able to complete all dressing (putting on shirts/jackets, L shoulder internal rotation) without reproduction of pain   Time 8   Period Weeks   Status New               Plan - 12/28/16 1044    Clinical Impression Statement Pt encouraged to disontinue L shoulder abduction at home. Instead will perform isometric abduction and ER. Pt with pain in L shoulder with strength testing below 90 abduction. However abduction and external rotation is both strong. Pt encouraged to follow-up as scheduled.    Rehab Potential Good   Clinical Impairments Affecting Rehab Potential Positive: motivation, acuity; Negative: age, work related demands   PT Frequency 2x / week   PT Duration 8 weeks   PT Treatment/Interventions ADLs/Self Care Home Management;Aquatic  Therapy;Biofeedback;Cryotherapy;Electrical Stimulation;Iontophoresis '4mg'$ /ml Dexamethasone;Moist Heat;Ultrasound;Traction;Gait training;Therapeutic activities;Therapeutic exercise;Balance training;Neuromuscular re-education;Patient/family education;Manual techniques;Passive range of motion;Dry needling   PT Next Visit Plan Progress scapular strengthening, pain-free shoulder mobs, shoulder PROM as needed, progress HEP?   PT Home Exercise Plan standing/seated scapular retraction   Consulted and Agree with Plan of Care Patient      Patient will benefit from skilled therapeutic intervention in order to improve the following deficits and impairments:  Pain, Decreased range of motion, Decreased strength  Visit Diagnosis: Acute pain of left shoulder  Muscle weakness (generalized)  Problem List Patient Active Problem List   Diagnosis Date Noted  . Neck pain on right side 03/23/2016  . History of tobacco abuse 03/23/2016  . Cervical lymphadenitis 03/23/2016  . 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 Allergic rhinitis 03/28/2015  . 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 Failure of erection 03/28/2015  . Acid reflux 03/28/2015  . Arthritis, degenerative 03/28/2015  . Change in blood platelet count 03/28/2015  . Temporary cerebral vascular dysfunction 03/28/2015  . Personal history of tobacco use, presenting hazards to health 03/28/2015   Phillips Grout PT, DPT   Jeweline Reif 12/28/2016, 11:42 AM  [facility] [address], [place], [address] Phone: [phone]   Fax:  [phone]  Name: MATSON WELCH MRN: [id number] Date of Birth: 15-Aug-1946

## 2016-12-29 ENCOUNTER — Ambulatory Visit: Payer: Medicare Other | Admitting: Urology

## 2017-01-02 ENCOUNTER — Ambulatory Visit: Payer: Medicare Other

## 2017-01-02 ENCOUNTER — Encounter: Payer: Self-pay | Admitting: Physical Therapy

## 2017-01-02 DIAGNOSIS — M6281 Muscle weakness (generalized): Secondary | ICD-10-CM

## 2017-01-02 DIAGNOSIS — M25512 Pain in left shoulder: Secondary | ICD-10-CM | POA: Diagnosis not present

## 2017-01-02 NOTE — Therapy (Signed)
[facility] [address] [place], [address] Phone: [phone]   Fax:  [phone]  Physical Therapy Treatment  Patient Details  Name: Dwayne Thompson MRN: [id number] Date of Birth: [birth year] Referring Provider: Thornton Park  Encounter Date: 01/02/2017      PT End of Session - 01/02/17 1008    Visit Number 9   Number of Visits 17   Date for PT Re-Evaluation 02-11-2017   Authorization Type g codes 06/17/23   PT Start Time 1010   PT Stop Time 1043   PT Time Calculation (min) 33 min   Activity Tolerance Patient tolerated treatment well   Behavior During Therapy [facility] for tasks assessed/performed      Past Medical History:  Diagnosis Date  . Back pain   . BPH (benign prostatic hyperplasia)   . ED (erectile dysfunction)     Past Surgical History:  Procedure Laterality Date  . HERNIA REPAIR     1980's    There were no vitals filed for this visit.      Subjective Assessment - 01/02/17 1008    Subjective Pt reports persistent L shoulder soreness upon arrival. Has modified HEP to avoid L shoulder abduction without notable improvement in pain. No specific questions or concerns at this time.    Pertinent History Pt arrived with complaints of L shoulder pain that started 2 months ago. No known change in activities with leisure or work which precipitated the onset of pain. No known trauma to shoulder. Describes shoulder pain as "aching." Worst: 9/10 (with internal rotation), best: 0/10, present: 0/10. No change in pain from AM to PM. Pain never wakes him up at night. Pt initially denies history of neck pain but with further questioning reports some mild L posterior neck soreness which started when shoulder pain started. Pain is on the lateral aspect of his left shoulder. He reports some lateral posterior forearm pain and some pain in the dorsal aspect of L hand. He is unsure if the forearm and hand pain is related to  the shoulder pain and they don't always occur at the same time. Denies N/T in LUE. He reports no similar symptoms in the past. 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 Aggravating factors: lifting, putting on jacket, rolling onto the L shoulder at night. Easing factors: rest, stop activity. Pt denies weakness but reports that pain is his primary limiting factor. He is now taking meloxicam which was prescribed by his orthopedist. He has a follow-up appointment with orthopedist in May 2018. ROS negative for red flags.    Limitations Lifting   Diagnostic tests Radiographs of L shoulder (WNL) and neck (mid cervical DDD). No history of MRI   Patient Stated Goals Decrease L shoulder pain and improve function at home and work. To be able to lift without pain   Currently in Pain? Yes   Pain Score 4    Pain Location Shoulder   Pain Orientation Left   Pain Descriptors / Indicators Sore   Pain Type Chronic pain   Pain Onset More than a month ago   Pain Frequency Intermittent          Treatment  Manual therapy  PROM L shoulder flexion, abduction, and ER. Persistent pain around 110 abduction initially; Supine L shoulderAP mobilization at available end range ER and 90 degrees of abduction grade III, 30s/bout x 3 bouts; Floppy fish L shoulder mobilizations with intermittent  distraction of Pony joint while progressing through scaption PROM, pt with difficulty relaxing with significant guarding noted; Supine L shoulder inferior mobilizations at 90 abduction first and then progressing further into abduction grade III, 30s/bout x 3 bouts; Notable improvement in pain-free L shoulder PROM abduction following manual therapy;  Ther-ex Supine L serratus punch with manual resistance 2 x 10; Rhythmic stabilizations at 100 degrees of scaption 30s x 2; Standing L shoulder ball rolls on wall at shoulder height in flexion and scaption x 20 CW/CCW each;;  Iontophoresis Ionto patch  applied to L shoulder just inferior and slightly anterior to acromion at site of paine. Applied at end of session with 1.0 mL of dexamethasone; Pt issued education handout which was reviewed in depth and questions answered;                        PT Education - 01/02/17 1008    Education provided Yes   Education Details HEP reinforced   Person(s) Educated Patient   Methods Explanation   Comprehension Verbalized understanding             PT Long Term Goals - 11/29/16 1353      PT LONG TERM GOAL #1   Title Pt will be independent with HEP in order to improve strength and AROM of L shoulder in order to decrease pain and improve function at home and work.    Time 8   Period Weeks   Status New     PT LONG TERM GOAL #2   Title Pt will demonstrate worse pain as 6/10 on NPRS for L shoulder in order to demonstrate clinically significant reduction in pain   Baseline 11/29/16: 9/10 worse pain   Time 8   Period Weeks   Status New     PT LONG TERM GOAL #3   Title Pt will improve L shoulder AROM/PROM flexion and abduction to within 10 degrees of R shoulder in order to improve function at home   Baseline 11/29/16: L shoulder flexion: 120, abduction: 130 degrees   Time 8   Period Weeks   Status New     PT LONG TERM GOAL #4   Title Pt will be able to complete all dressing (putting on shirts/jackets, L shoulder internal rotation) without reproduction of pain   Time 8   Period Weeks   Status New               Plan - 01/02/17 1008    Clinical Impression Statement Pt arrived late for his appointment so session was abbreviated accordingly due to scheduling conflicts. L shoulder abduction is painful initially but with significant reduction following manual therapy. Pt is considering going to see ortho for a steroid injection. Will attempt iontophoresis with dexamethasone to L shoulder to assess pt response. Pt encouraged to continue HEP and follow-up as scheduled.     Rehab Potential Good   Clinical Impairments Affecting Rehab Potential Positive: motivation, acuity; Negative: age, work related demands   PT Frequency 2x / week   PT Duration 8 weeks   PT Treatment/Interventions ADLs/Self Care Home Management;Aquatic Therapy;Biofeedback;Cryotherapy;Electrical Stimulation;Iontophoresis '4mg'$ /ml Dexamethasone;Moist Heat;Ultrasound;Traction;Gait training;Therapeutic activities;Therapeutic exercise;Balance training;Neuromuscular re-education;Patient/family education;Manual techniques;Passive range of motion;Dry needling   PT Next Visit Plan Outcome measures, g codes, update goals, assess response to iontophoresis. Progress scapular strengthening, pain-free shoulder mobs, shoulder PROM as needed, progress HEP?   PT Home Exercise Plan standing/seated scapular retraction   Consulted and Agree with Plan of Care  Patient      Patient will benefit from skilled therapeutic intervention in order to improve the following deficits and impairments:  Pain, Decreased range of motion, Decreased strength  Visit Diagnosis: Acute pain of left shoulder  Muscle weakness (generalized)     Problem List Patient Active Problem List   Diagnosis Date Noted  . Neck pain on right side 03/23/2016  . History of tobacco abuse 03/23/2016  . Cervical lymphadenitis 03/23/2016  . 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 Allergic rhinitis 03/28/2015  . 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 Failure of erection 03/28/2015  . Acid reflux 03/28/2015  . Arthritis, degenerative 03/28/2015  . Change in blood platelet count 03/28/2015  . Temporary cerebral vascular dysfunction 03/28/2015  . Personal history of tobacco use, presenting hazards to health 03/28/2015   Phillips Grout PT, DPT   Huprich,Jason 01/02/2017, 10:55 AM  [facility] [address], [place], [address] Phone: [phone]   Fax:  [phone]  Name: Dwayne Thompson MRN: [id number] Date of Birth: Jan 07, 1946

## 2017-01-05 ENCOUNTER — Ambulatory Visit: Payer: Medicare Other

## 2017-01-05 DIAGNOSIS — M25512 Pain in left shoulder: Secondary | ICD-10-CM

## 2017-01-05 DIAGNOSIS — M6281 Muscle weakness (generalized): Secondary | ICD-10-CM

## 2017-01-05 NOTE — Therapy (Signed)
[facility] [address] [place], [address] Phone: [phone]   Fax:  [phone]  Physical Therapy Treatment  Patient Details  Name: Dwayne Thompson MRN: [id number] Date of Birth: 10-28-1945 Referring Provider: Thornton Park  Encounter Date: 01/05/2017      PT End of Session - 01/05/17 1005    Visit Number 10   Number of Visits 17   Date for PT Re-Evaluation 02/05/17   Authorization Type g codes 10/10   PT Start Time 1000   PT Stop Time 1045   PT Time Calculation (min) 45 min   Activity Tolerance Patient tolerated treatment well   Behavior During Therapy [facility] for tasks assessed/performed      Past Medical History:  Diagnosis Date  . Back pain   . BPH (benign prostatic hyperplasia)   . ED (erectile dysfunction)     Past Surgical History:  Procedure Laterality Date  . HERNIA REPAIR     1980's    There were no vitals filed for this visit.      Subjective Assessment - 01/05/17 0959    Subjective Pt states that his L shoulder is "a lot better" today. He believes that the iontophoresis helped last session. Denies pain today. No specific questions or concerns at this time.    Pertinent History Pt arrived with complaints of L shoulder pain that started 2 months ago. No known change in activities with leisure or work which precipitated the onset of pain. No known trauma to shoulder. Describes shoulder pain as "aching." Worst: 9/10 (with internal rotation), best: 0/10, present: 0/10. No change in pain from AM to PM. Pain never wakes him up at night. Pt initially denies history of neck pain but with further questioning reports some mild L posterior neck soreness which started when shoulder pain started. Pain is on the lateral aspect of his left shoulder. He reports some lateral posterior forearm pain and some pain in the dorsal aspect of L hand. He is unsure if the forearm and hand pain is related to the  shoulder pain and they don't always occur at the same time. Denies N/T in LUE. He reports no similar symptoms in the past. 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 Aggravating factors: lifting, putting on jacket, rolling onto the L shoulder at night. Easing factors: rest, stop activity. Pt denies weakness but reports that pain is his primary limiting factor. He is now taking meloxicam which was prescribed by his orthopedist. He has a follow-up appointment with orthopedist in May 2018. ROS negative for red flags.    Limitations Lifting   Diagnostic tests Radiographs of L shoulder (WNL) and neck (mid cervical DDD). No history of MRI   Patient Stated Goals Decrease L shoulder pain and improve function at home and work. To be able to lift without pain   Currently in Pain? No/denies   Pain Onset --                [facility] PT Assessment - 01/05/17 1010      Observation/Other Assessments   Other Surveys  Other Surveys   Quick DASH  9.1%     ROM / Strength   AROM / PROM / Strength AROM;Strength     AROM   Left Shoulder Flexion 156 Degrees  Minimal pain at end range   Left Shoulder ABduction 143 Degrees  Minimal pain at end range   Left Shoulder  Internal Rotation 66 Degrees  Minimal pain at end range   Left Shoulder External Rotation 65 Degrees  Minimal pain at end range     Strength   Strength Assessment Site Shoulder   Right/Left Shoulder Left   Left Shoulder Flexion 5/5  Painless   Left Shoulder ABduction 5/5  Minimal pain           Treatment  Manual therapy  Moist heat applied to L shoulder x 5 minutes (unbilled); Pt completed quick DASH (unbilled); AROM and MMT measures obtained with patient; Updated goals and discussed plan of care with patient; PROM L shoulder flexion, abduction, and ER with end range hold. L shoulderAP mobilizations at neutral for pain control grade III, 30s/bout x 3bouts; L shoulder AP mobilizations at end range ER  and 90 degrees of abduction for pain control grade III, 30s/bout x 3bouts; Floppy fish L shoulder mobilizations with intermittent distraction of GH joint while progressing through scaption PROM, pt with difficulty relaxing with significant guarding noted;  Ther-ex Standing mat table push up plus 2 x 10, pt requires extensive verbal and tactile cues to perform correctly; Standing L shoulder ball rolls on wall at shoulder height in flexion and scaption x 20 CW/CCW each;  Iontophoresis Ionto patch applied to L shoulder just inferior and slightly anterior to acromion at same site as pain last time. No pain with palpation reported today. Applied at end of session with 1.0 mL of dexamethasone;                         PT Education - 01/05/17 1005    Education provided Yes   Education Details HEP reinforced   Person(s) Educated Patient   Methods Explanation   Comprehension Verbalized understanding             PT Long Term Goals - 01/05/17 1006      PT LONG TERM GOAL #1   Title Pt will be independent with HEP in order to improve strength and AROM of L shoulder in order to decrease pain and improve function at home and work.    Time 8   Period Weeks   Status On-going     PT LONG TERM GOAL #2   Title Pt will demonstrate worse pain as 6/10 on NPRS for L shoulder in order to demonstrate clinically significant reduction in pain   Baseline 11/29/16: 9/10 worse pain; 01/05/17: 5/10   Time 8   Period Weeks   Status Achieved     PT LONG TERM GOAL #3   Title Pt will improve L shoulder AROM/PROM flexion and abduction to within 10 degrees of R shoulder in order to improve function at home   Baseline 11/29/16: L shoulder flexion: 120, abduction: 130 degrees; 01/05/17: L shoulder flexion: 156, abduction: 143 degrees;   Time 8   Period Weeks   Status Achieved     PT LONG TERM GOAL #4   Title Pt will be able to complete all dressing (putting on shirts/jackets, L shoulder  internal rotation) without reproduction of pain   Time 8   Period Weeks   Status On-going     PT LONG TERM GOAL #5   Title Pt will decrease quick DASH score by at least 8% in order to demonstrate clinically significant reduction in disability.   Baseline Baseline 27.3% 01/05/17: 9.1%   Time 4   Period Weeks   Status New  Plan - 2017-01-18 1005    Clinical Impression Statement Pt demonstrates considerable improvement in L shoulder pain and AROM flexion, abductin, ER, and IR today. He continues to have some end range pain with flexion and abduction as well as resisted abduction strength testing. Quick DASH decreased from 27.3% at initial evaluation to 9.1% today. Pt will continue to benefit from skilled PT services to further improve pain and return to full function at home and work.    Rehab Potential Good   Clinical Impairments Affecting Rehab Potential Positive: motivation, acuity; Negative: age, work related demands   PT Frequency 2x / week   PT Duration 8 weeks   PT Treatment/Interventions ADLs/Self Care Home Management;Aquatic Therapy;Biofeedback;Cryotherapy;Electrical Stimulation;Iontophoresis '4mg'$ /ml Dexamethasone;Moist Heat;Ultrasound;Traction;Gait training;Therapeutic activities;Therapeutic exercise;Balance training;Neuromuscular re-education;Patient/family education;Manual techniques;Passive range of motion;Dry needling   PT Next Visit Plan Progress scapular strengthening, pain-free shoulder mobs, iontophoresis (pt responded very well after first session it was utilized);   PT Home Exercise Plan standing/seated scapular retraction   Consulted and Agree with Plan of Care Patient      Patient will benefit from skilled therapeutic intervention in order to improve the following deficits and impairments:  Pain, Decreased range of motion, Decreased strength  Visit Diagnosis: Acute pain of left shoulder  Muscle weakness (generalized)       G-Codes - 01/18/2017  1026    Functional Assessment Tool Used (Outpatient Only) quick dash, clinical judgement   Functional Limitation Carrying, moving and handling objects   Carrying, Moving and Handling Objects Current Status (F7903) At least 1 percent but less than 20 percent impaired, limited or restricted   Carrying, Moving and Handling Objects Goal Status (Y3338) At least 1 percent but less than 20 percent impaired, limited or restricted      Problem List Patient Active Problem List   Diagnosis Date Noted  . Neck pain on right side 03/23/2016  . History of tobacco abuse 03/23/2016  . Cervical lymphadenitis 03/23/2016  . 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 Allergic rhinitis 03/28/2015  . 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 Failure of erection 03/28/2015  . Acid reflux 03/28/2015  . Arthritis, degenerative 03/28/2015  . Change in blood platelet count 03/28/2015  . Temporary cerebral vascular dysfunction 03/28/2015  . Personal history of tobacco use, presenting hazards to health 03/28/2015   Phillips Grout PT, DPT   Huprich,Jason 01/18/2017, 11:52 AM  [facility] [address], [place], [address] Phone: [phone]   Fax:  [phone]  Name: Dwayne Thompson MRN: [id number] Date of Birth: 1945/11/10

## 2017-01-09 ENCOUNTER — Ambulatory Visit: Payer: Medicare Other

## 2017-01-09 DIAGNOSIS — M25512 Pain in left shoulder: Secondary | ICD-10-CM

## 2017-01-09 DIAGNOSIS — M6281 Muscle weakness (generalized): Secondary | ICD-10-CM

## 2017-01-09 NOTE — Therapy (Signed)
[facility] [address], [place], [address] Phone: [phone]   Fax:  [phone]  Physical Therapy Treatment  Patient Details  Name: Dwayne Thompson MRN: [id number] Date of Birth: Mar 29, 1946 Referring Provider: Thornton Park  Encounter Date: 01/09/2017      PT End of Session - 01/09/17 1050    Visit Number 11   Number of Visits 17   Date for PT Re-Evaluation 02-06-2017   Authorization Type g codes 1/10   PT Start Time 1000   PT Stop Time 1045   PT Time Calculation (min) 45 min   Activity Tolerance Patient tolerated treatment well   Behavior During Therapy [facility] for tasks assessed/performed      Past Medical History:  Diagnosis Date  . Back pain   . BPH (benign prostatic hyperplasia)   . ED (erectile dysfunction)     Past Surgical History:  Procedure Laterality Date  . HERNIA REPAIR     1980's    There were no vitals filed for this visit.      Subjective Assessment - 01/09/17 1048    Subjective Patient reports he only has pain at end range of shoulder flexion and abduction. Patient states no current pain in the shoulder. Patient reports increased pain when lying on the L shoulder.    Pertinent History Pt arrived with complaints of L shoulder pain that started 2 months ago. No known change in activities with leisure or work which precipitated the onset of pain. No known trauma to shoulder. Describes shoulder pain as "aching." Worst: 9/10 (with internal rotation), best: 0/10, present: 0/10. No change in pain from AM to PM. Pain never wakes him up at night. Pt initially denies history of neck pain but with further questioning reports some mild L posterior neck soreness which started when shoulder pain started. Pain is on the lateral aspect of his left shoulder. He reports some lateral posterior forearm pain and some pain in the dorsal aspect of L hand. He is unsure if the forearm and hand pain is related  to the shoulder pain and they don't always occur at the same time. Denies N/T in LUE. He reports no similar symptoms in the past. 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 Aggravating factors: lifting, putting on jacket, rolling onto the L shoulder at night. Easing factors: rest, stop activity. Pt denies weakness but reports that pain is his primary limiting factor. He is now taking meloxicam which was prescribed by his orthopedist. He has a follow-up appointment with orthopedist in May 2018. ROS negative for red flags.    Limitations Lifting   Diagnostic tests Radiographs of L shoulder (WNL) and neck (mid cervical DDD). No history of MRI   Patient Stated Goals Decrease L shoulder pain and improve function at home and work. To be able to lift without pain   Currently in Pain? No/denies      Manual Therapy L shoulder AP/inferior mobilizations at neutral for pain control grade III, 30s/bout x 3 bouts; L shoulder AP/inferior mobilizations at end range ER and 90 degrees of abduction for pain control grade III, 30s/bout x 3 bouts; L shoulder horizontal abduction @ 90deg flexion A->P mobilizations grades III/IV to improve mobilizations    Ther-ex Standing No money exercise with RTB - 2 min  Supine serratus punches - x20 UE ranger shoulder flexion/ER& IR/abd - x 20 each direction Standing shoulder retraction at [employer] - x25  with tactile cueing to improve mobilization of scapular musculature Seated high row at OMEGA machine - x 25 25#   Iontophoresis Ionto patch applied to L shoulder just inferior and slightly anterior to acromion at same site as pain last time. No pain with palpation reported today. Applied at end of session with 1.0 mL of dexamethasone;      PT Education - 01/09/17 1050    Education provided Yes   Education Details Educated on form/technique for exercise performance   Person(s) Educated Patient   Methods Explanation;Demonstration   Comprehension  Verbalized understanding;Returned demonstration             PT Long Term Goals - 01/05/17 1006      PT LONG TERM GOAL #1   Title Pt will be independent with HEP in order to improve strength and AROM of L shoulder in order to decrease pain and improve function at home and work.    Time 8   Period Weeks   Status On-going     PT LONG TERM GOAL #2   Title Pt will demonstrate worse pain as 6/10 on NPRS for L shoulder in order to demonstrate clinically significant reduction in pain   Baseline 11/29/16: 9/10 worse pain; 01/05/17: 5/10   Time 8   Period Weeks   Status Achieved     PT LONG TERM GOAL #3   Title Pt will improve L shoulder AROM/PROM flexion and abduction to within 10 degrees of R shoulder in order to improve function at home   Baseline 11/29/16: L shoulder flexion: 120, abduction: 130 degrees; 01/05/17: L shoulder flexion: 156, abduction: 143 degrees;   Time 8   Period Weeks   Status Achieved     PT LONG TERM GOAL #4   Title Pt will be able to complete all dressing (putting on shirts/jackets, L shoulder internal rotation) without reproduction of pain   Time 8   Period Weeks   Status On-going     PT LONG TERM GOAL #5   Title Pt will decrease quick DASH score by at least 8% in order to demonstrate clinically significant reduction in disability.   Baseline Baseline 27.3% 01/05/17: 9.1%   Time 4   Period Weeks   Status New               Plan - 01/09/17 1050    Clinical Impression Statement Focused on performing shoulder mobilizations at end range shoulder flexion/abduction to improve shoulder motion. Patient demonstrates increased pain at end range shoulder abduction which was decreased after modifying range of motion. Patient demonstrates decreased strength and endurance within shoulder musculature and will benefit from further skilled therapy to return to prior level of function.    Rehab Potential Good   Clinical Impairments Affecting Rehab Potential Positive:  motivation, acuity; Negative: age, work related demands   PT Frequency 2x / week   PT Duration 8 weeks   PT Treatment/Interventions ADLs/Self Care Home Management;Aquatic Therapy;Biofeedback;Cryotherapy;Electrical Stimulation;Iontophoresis '4mg'$ /ml Dexamethasone;Moist Heat;Ultrasound;Traction;Gait training;Therapeutic activities;Therapeutic exercise;Balance training;Neuromuscular re-education;Patient/family education;Manual techniques;Passive range of motion;Dry needling   PT Next Visit Plan Progress scapular strengthening, pain-free shoulder mobs, iontophoresis (pt responded very well after first session it was utilized);   PT Home Exercise Plan standing/seated scapular retraction   Consulted and Agree with Plan of Care Patient      Patient will benefit from skilled therapeutic intervention in order to improve the following deficits and impairments:  Pain, Decreased range of motion, Decreased strength  Visit Diagnosis: Acute pain of  left shoulder  Muscle weakness (generalized)     Problem List Patient Active Problem List   Diagnosis Date Noted  . Neck pain on right side 03/23/2016  . History of tobacco abuse 03/23/2016  . Cervical lymphadenitis 03/23/2016  . 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 Allergic rhinitis 03/28/2015  . 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 Failure of erection 03/28/2015  . Acid reflux 03/28/2015  . Arthritis, degenerative 03/28/2015  . Change in blood platelet count 03/28/2015  . Temporary cerebral vascular dysfunction 03/28/2015  . Personal history of tobacco use, presenting hazards to health 03/28/2015    Blythe Stanford, PT DPT 01/09/2017, 10:56 AM  [facility] [address], [place], [address] Phone: [phone]   Fax:  [phone]  Name:  Dwayne Thompson MRN: [id number] Date of Birth: 02/08/1946

## 2017-01-12 ENCOUNTER — Ambulatory Visit: Payer: Medicare Other

## 2017-01-12 DIAGNOSIS — M25512 Pain in left shoulder: Secondary | ICD-10-CM | POA: Diagnosis not present

## 2017-01-12 DIAGNOSIS — M6281 Muscle weakness (generalized): Secondary | ICD-10-CM

## 2017-01-12 NOTE — Therapy (Signed)
[facility] [address], [place], [address] Phone: [phone]   Fax:  [phone]  Physical Therapy Treatment  Patient Details  Name: Dwayne Thompson MRN: [id number] Date of Birth: 1946/02/10 Referring Provider: [place]  Encounter Date: 01/12/2017      PT End of Session - 01/12/17 1116    Visit Number 12   Number of Visits 17   Date for PT Re-Evaluation 02/18/17   Authorization Type g codes 2022-11-23   PT Start Time 1000   PT Stop Time 1045   PT Time Calculation (min) 45 min   Activity Tolerance Patient tolerated treatment well   Behavior During Therapy [facility] for tasks assessed/performed      Past Medical History:  Diagnosis Date  . Back pain   . BPH (benign prostatic hyperplasia)   . ED (erectile dysfunction)     Past Surgical History:  Procedure Laterality Date  . HERNIA REPAIR     1980's    There were no vitals filed for this visit.      Subjective Assessment - 01/12/17 1004    Subjective Patient reports increased pain when at end range flexion and abduction. Patient states the pain comes and goes but is able to perform more ADLs with his shoulder.    Pertinent History Pt arrived with complaints of L shoulder pain that started 2 months ago. No known change in activities with leisure or work which precipitated the onset of pain. No known trauma to shoulder. Describes shoulder pain as "aching." Worst: 9/10 (with internal rotation), best: 0/10, present: 0/10. No change in pain from AM to PM. Pain never wakes him up at night. Pt initially denies history of neck pain but with further questioning reports some mild L posterior neck soreness which started when shoulder pain started. Pain is on the lateral aspect of his left shoulder. He reports some lateral posterior forearm pain and some pain in the dorsal aspect of L hand. He is unsure if the forearm and hand pain is related to the shoulder pain and  they don't always occur at the same time. Denies N/T in LUE. He reports no similar symptoms in the past. 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 Aggravating factors: lifting, putting on jacket, rolling onto the L shoulder at night. Easing factors: rest, stop activity. Pt denies weakness but reports that pain is his primary limiting factor. He is now taking meloxicam which was prescribed by his orthopedist. He has a follow-up appointment with orthopedist in May 2018. ROS negative for red flags.    Limitations Lifting   Diagnostic tests Radiographs of L shoulder (WNL) and neck (mid cervical DDD). No history of MRI   Patient Stated Goals Decrease L shoulder pain and improve function at home and work. To be able to lift without pain   Currently in Pain? No/denies      Manual Therapy L shoulder AP/inferior mobilizations at neutral for pain control grade III, 30s/bout x 3 bouts; L shoulder AP/inferior mobilizations at end range ER and 90 degrees of abduction for pain control grade III, 30s/bout x 3 bouts; L shoulder horizontal abduction @ 90deg flexion A->P mobilizations grades III/IV to improve mobilizations    Ther-ex Standing No money exercise with RTB - 2x20 Serratus punches in standing with RTB- 2x20 UE ranger shoulder flexion & abduction - x 20 each direction; x20 with 2#  Standing shoulder flexion with yellow physioball -  2 x 20; Push up PLUS against wall - x 20  Wall angels while facing wall - x 15   Iontophoresis Ionto patch applied to L shoulder just inferior and slightly anterior to acromion at same site as pain last time. No pain with palpation reported today. Applied at end of session with 1.0 mL of dexamethasone       PT Education - 01/12/17 1022    Education provided Yes   Education Details form/technique with exercise   Person(s) Educated Patient   Methods Explanation;Demonstration   Comprehension Verbalized understanding;Returned demonstration              PT Long Term Goals - 01/05/17 1006      PT LONG TERM GOAL #1   Title Pt will be independent with HEP in order to improve strength and AROM of L shoulder in order to decrease pain and improve function at home and work.    Time 8   Period Weeks   Status On-going     PT LONG TERM GOAL #2   Title Pt will demonstrate worse pain as 6/10 on NPRS for L shoulder in order to demonstrate clinically significant reduction in pain   Baseline 11/29/16: 9/10 worse pain; 01/05/17: 5/10   Time 8   Period Weeks   Status Achieved     PT LONG TERM GOAL #3   Title Pt will improve L shoulder AROM/PROM flexion and abduction to within 10 degrees of R shoulder in order to improve function at home   Baseline 11/29/16: L shoulder flexion: 120, abduction: 130 degrees; 01/05/17: L shoulder flexion: 156, abduction: 143 degrees;   Time 8   Period Weeks   Status Achieved     PT LONG TERM GOAL #4   Title Pt will be able to complete all dressing (putting on shirts/jackets, L shoulder internal rotation) without reproduction of pain   Time 8   Period Weeks   Status On-going     PT LONG TERM GOAL #5   Title Pt will decrease quick DASH score by at least 8% in order to demonstrate clinically significant reduction in disability.   Baseline Baseline 27.3% 01/05/17: 9.1%   Time 4   Period Weeks   Status New               Plan - 01/12/17 1255    Clinical Impression Statement Focused on improving overhead motion to improve muscular activation and coordination of shoulder and scapular muscles. Patient demonstrates improved pain when raising arm over today compared to previous treatment indicating functional carryover between visits and patient will benefit from further skilled therapy to return to prior level of function.    Rehab Potential Good   Clinical Impairments Affecting Rehab Potential Positive: motivation, acuity; Negative: age, work related demands   PT Frequency 2x / week   PT Duration  8 weeks   PT Treatment/Interventions ADLs/Self Care Home Management;Aquatic Therapy;Biofeedback;Cryotherapy;Electrical Stimulation;Iontophoresis '4mg'$ /ml Dexamethasone;Moist Heat;Ultrasound;Traction;Gait training;Therapeutic activities;Therapeutic exercise;Balance training;Neuromuscular re-education;Patient/family education;Manual techniques;Passive range of motion;Dry needling   PT Next Visit Plan Progress scapular strengthening, pain-free shoulder mobs, iontophoresis (pt responded very well after first session it was utilized);   PT Home Exercise Plan standing/seated scapular retraction   Consulted and Agree with Plan of Care Patient      Patient will benefit from skilled therapeutic intervention in order to improve the following deficits and impairments:  Pain, Decreased range of motion, Decreased strength, Impaired perceived functional ability  Visit Diagnosis: Acute pain of left shoulder  Muscle weakness (generalized)     Problem List Patient Active Problem List   Diagnosis Date Noted  . Neck pain on right side 03/23/2016  . History of tobacco abuse 03/23/2016  . Cervical lymphadenitis 03/23/2016  . 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 Allergic rhinitis 03/28/2015  . 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 Failure of erection 03/28/2015  . Acid reflux 03/28/2015  . Arthritis, degenerative 03/28/2015  . Change in blood platelet count 03/28/2015  . Temporary cerebral vascular dysfunction 03/28/2015  . Personal history of tobacco use, presenting hazards to health 03/28/2015    Blythe Stanford, PT DPT 01/12/2017, 1:01 PM  [facility] [address] [place], [address] Phone: [phone]   Fax:  [phone]  Name: Dwayne Thompson MRN: [id number] Date of Birth: 09/08/1946

## 2017-01-16 ENCOUNTER — Ambulatory Visit: Payer: Medicare Other

## 2017-01-19 ENCOUNTER — Ambulatory Visit: Payer: Medicare Other | Attending: Orthopedic Surgery

## 2017-01-19 DIAGNOSIS — M6281 Muscle weakness (generalized): Secondary | ICD-10-CM | POA: Insufficient documentation

## 2017-01-19 DIAGNOSIS — M25512 Pain in left shoulder: Secondary | ICD-10-CM | POA: Insufficient documentation

## 2017-01-19 NOTE — Therapy (Signed)
[facility] [address], [place], [address] Phone: [phone]   Fax:  [phone]  Physical Therapy Treatment  Patient Details  Name: Dwayne Thompson MRN: [id number] Date of Birth: 1946-02-16 Referring Provider: Thornton Park  Encounter Date: 01/19/2017      PT End of Session - 01/19/17 1038    Visit Number 13   Number of Visits 17   Date for PT Re-Evaluation 02-19-17   Authorization Type g codes 12/23/22   PT Start Time 1000   PT Stop Time 1045   PT Time Calculation (min) 45 min   Activity Tolerance Patient tolerated treatment well   Behavior During Therapy [facility] for tasks assessed/performed      Past Medical History:  Diagnosis Date  . Back pain   . BPH (benign prostatic hyperplasia)   . ED (erectile dysfunction)     Past Surgical History:  Procedure Laterality Date  . HERNIA REPAIR     1980's    There were no vitals filed for this visit.      Subjective Assessment - 01/19/17 1005    Subjective Patient reports he's able to sleep better on the shoulder and states he feels the shoulder is geting better. Patient states the shoulder has not been hurting as much and is reports his shoulder is about 60% improved.    Pertinent History Pt arrived with complaints of L shoulder pain that started 2 months ago. No known change in activities with leisure or work which precipitated the onset of pain. No known trauma to shoulder. Describes shoulder pain as "aching." Worst: 9/10 (with internal rotation), best: 0/10, present: 0/10. No change in pain from AM to PM. Pain never wakes him up at night. Pt initially denies history of neck pain but with further questioning reports some mild L posterior neck soreness which started when shoulder pain started. Pain is on the lateral aspect of his left shoulder. He reports some lateral posterior forearm pain and some pain in the dorsal aspect of L hand. He is unsure if the forearm  and hand pain is related to the shoulder pain and they don't always occur at the same time. Denies N/T in LUE. He reports no similar symptoms in the past. 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 Aggravating factors: lifting, putting on jacket, rolling onto the L shoulder at night. Easing factors: rest, stop activity. Pt denies weakness but reports that pain is his primary limiting factor. He is now taking meloxicam which was prescribed by his orthopedist. He has a follow-up appointment with orthopedist in May 2018. ROS negative for red flags.    Limitations Lifting   Diagnostic tests Radiographs of L shoulder (WNL) and neck (mid cervical DDD). No history of MRI   Patient Stated Goals Decrease L shoulder pain and improve function at home and work. To be able to lift without pain   Currently in Pain? No/denies      Manual Therapy L shoulder AP/inferior mobilizations at neutral for pain control grade III, 30s/bout x 3 bouts; L shoulder AP/inferior mobilizations at end range ER and 90 degrees of abduction for pain control grade III, 30s/bout x 3 bouts; L shoulder horizontal abduction @ 90deg flexion A->P mobilizations grades III/IV to improve mobilizations  3 x 30sec   Ther-ex Standing No money exercise with RTB - 2x20 Straight arm push downs at [employer] - x20 15#, x20 20# Serratus punches in standing  with RTB- 2x20 Straight arm serratus circular motions - x 20 cw/ccw with 4# ball Ball over head 4# ball - x 30 bounces on the wall  Wall angels while facing wall - x 15 UE ranger shoulder  abduction - x 30 each direction;    Iontophoresis Ionto patch applied to L shoulder just inferior and slightly anterior to acromion at same site as pain last time. No pain with palpation reported today. Applied at end of session with 1.0 mL of dexamethasone Patinet responds with no increase in pain after performing therapeutic exercise and demonstrates increased fatigue       PT  Education - 01/19/17 1024    Education provided Yes   Education Details form/technique with exercise   Person(s) Educated Patient   Methods Explanation;Demonstration   Comprehension Returned demonstration;Verbalized understanding             PT Long Term Goals - 01/05/17 1006      PT LONG TERM GOAL #1   Title Pt will be independent with HEP in order to improve strength and AROM of L shoulder in order to decrease pain and improve function at home and work.    Time 8   Period Weeks   Status On-going     PT LONG TERM GOAL #2   Title Pt will demonstrate worse pain as 6/10 on NPRS for L shoulder in order to demonstrate clinically significant reduction in pain   Baseline 11/29/16: 9/10 worse pain; 01/05/17: 5/10   Time 8   Period Weeks   Status Achieved     PT LONG TERM GOAL #3   Title Pt will improve L shoulder AROM/PROM flexion and abduction to within 10 degrees of R shoulder in order to improve function at home   Baseline 11/29/16: L shoulder flexion: 120, abduction: 130 degrees; 01/05/17: L shoulder flexion: 156, abduction: 143 degrees;   Time 8   Period Weeks   Status Achieved     PT LONG TERM GOAL #4   Title Pt will be able to complete all dressing (putting on shirts/jackets, L shoulder internal rotation) without reproduction of pain   Time 8   Period Weeks   Status On-going     PT LONG TERM GOAL #5   Title Pt will decrease quick DASH score by at least 8% in order to demonstrate clinically significant reduction in disability.   Baseline Baseline 27.3% 01/05/17: 9.1%   Time 4   Period Weeks   Status New               Plan - 01/19/17 1049    Clinical Impression Statement Continued to focus on improving serratus anterior and lower trap muscular strength to improve overhead motion. Patient demonstrates improve shoulder abduction pain free motion with performing scapular stabilization and assist test indicating decreased scapular muscular strength along the scapula  and patient will benefit to return to prior level of function.    Rehab Potential Good   Clinical Impairments Affecting Rehab Potential Positive: motivation, acuity; Negative: age, work related demands   PT Frequency 2x / week   PT Duration 8 weeks   PT Treatment/Interventions ADLs/Self Care Home Management;Aquatic Therapy;Biofeedback;Cryotherapy;Electrical Stimulation;Iontophoresis '4mg'$ /ml Dexamethasone;Moist Heat;Ultrasound;Traction;Gait training;Therapeutic activities;Therapeutic exercise;Balance training;Neuromuscular re-education;Patient/family education;Manual techniques;Passive range of motion;Dry needling   PT Next Visit Plan Progress scapular strengthening, pain-free shoulder mobs, iontophoresis (pt responded very well after first session it was utilized);   PT Home Exercise Plan standing/seated scapular retraction   Consulted and Agree with Plan of  Care Patient      Patient will benefit from skilled therapeutic intervention in order to improve the following deficits and impairments:  Pain, Decreased range of motion, Decreased strength, Impaired perceived functional ability  Visit Diagnosis: Acute pain of left shoulder  Muscle weakness (generalized)     Problem List Patient Active Problem List   Diagnosis Date Noted  . Neck pain on right side 03/23/2016  . History of tobacco abuse 03/23/2016  . Cervical lymphadenitis 03/23/2016  . 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 Allergic rhinitis 03/28/2015  . 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 Failure of erection 03/28/2015  . Acid reflux 03/28/2015  . Arthritis, degenerative 03/28/2015  . Change in blood platelet count 03/28/2015  . Temporary cerebral vascular dysfunction 03/28/2015  . Personal history of tobacco use, presenting hazards to health 03/28/2015    Blythe Stanford, PT  DPT 01/19/2017, 11:40 AM  [facility] [address], [place], [address] Phone: [phone]   Fax:  [phone]  Name: Dwayne Thompson MRN: [id number] Date of Birth: [birth year]

## 2017-01-23 ENCOUNTER — Ambulatory Visit: Payer: Medicare Other

## 2017-01-25 ENCOUNTER — Ambulatory Visit: Payer: Medicare Other

## 2017-01-26 ENCOUNTER — Encounter: Payer: Medicare Other | Admitting: Physical Therapy

## 2017-02-24 ENCOUNTER — Other Ambulatory Visit: Payer: Self-pay | Admitting: Family Medicine

## 2017-03-06 ENCOUNTER — Ambulatory Visit (INDEPENDENT_AMBULATORY_CARE_PROVIDER_SITE_OTHER): Payer: Medicare Other | Admitting: Family Medicine

## 2017-03-06 ENCOUNTER — Encounter: Payer: Self-pay | Admitting: Family Medicine

## 2017-03-06 VITALS — BP 142/80 | HR 62 | Temp 97.9°F | Resp 16 | Wt 210.0 lb

## 2017-03-06 DIAGNOSIS — I10 Essential (primary) hypertension: Secondary | ICD-10-CM | POA: Diagnosis not present

## 2017-03-06 DIAGNOSIS — R61 Generalized hyperhidrosis: Secondary | ICD-10-CM | POA: Diagnosis not present

## 2017-03-06 DIAGNOSIS — M79602 Pain in left arm: Secondary | ICD-10-CM

## 2017-03-06 MED ORDER — LISINOPRIL 10 MG PO TABS: 10.0000 mg | ORAL_TABLET | Freq: Every day | ORAL | 3 refills | Status: DC

## 2017-03-06 NOTE — Progress Notes (Signed)
Subjective:  HPI Pt is here for a 4 month follow up of blood pressure. Last OV it was elevated and this is new for pt. Pt BP has been running 140-150/70-80 at home. He denies chest pain, shortness of breath. He reports that he used to walk but he does not now, and knows he needs to start back.    He says that he still having the arm pain that he has been having and seeing ortho for and had PT but does not seem to be better. He also has a lot of night sweats, does not happen every night. He reports that this has been going on for "quite a while".  Prior to Admission medications   Medication Sig Start Date End Date Taking? Authorizing Provider  aspirin 81 MG tablet Take by mouth.    [provider]  Cholecalciferol 1000 UNITS tablet Take by mouth. Reported on 03/03/2016    [provider]  doxazosin (CARDURA) 4 MG tablet TAKE 1 TABLET DAILY 04/19/16   Jerrol Banana., MD  IBU 800 MG tablet TAKE 1 TABLET THREE TIMES A DAY AS NEEDED 02/27/17   Jerrol Banana., MD  meloxicam (MOBIC) 7.5 MG tablet Take 7.5 mg by mouth 2 (two) times daily.    [provider]  tadalafil (CIALIS) 5 MG tablet Take one tablet daily for BPH Patient not taking: Reported on 11/29/2016 06/02/16   Nori Riis, PA-C    Patient Active Problem List   Diagnosis Date Noted  . Neck pain on right side 03/23/2016  . History of tobacco abuse 03/23/2016  . Cervical lymphadenitis 03/23/2016  . 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 Allergic rhinitis 03/28/2015  . 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 Failure of erection 03/28/2015  . Acid reflux 03/28/2015  . Arthritis, degenerative 03/28/2015  . Change in blood platelet count 03/28/2015  . Temporary cerebral vascular dysfunction 03/28/2015  . Personal history of tobacco use, presenting hazards to  health 03/28/2015    Past Medical History:  Diagnosis Date  . Back pain   . BPH (benign prostatic hyperplasia)   . ED (erectile dysfunction)     Social History   Social History  . Marital status: Married    Spouse name: [name]  . Number of children: 3  . Years of education: 12   Occupational History  . retired    Social History Main Topics  . Smoking status: Former Smoker    Packs/day: 1.00    Years: 50.00    Types: Cigarettes    Quit date: 10/20/2013  . Smokeless tobacco: Never Used  . Alcohol use Yes     Comment: daily, 3 to 4 drinks daily, usually gin and chases it down with soda  . Drug use: No  . Sexual activity: Not Currently   Other Topics Concern  . Not on file   Social History Narrative  . No narrative on file    Allergies  Allergen Reactions  . Tamsulosin Hcl Hives    Review of Systems  Constitutional: Positive for diaphoresis.  HENT: Negative.   Eyes: Negative.   Respiratory: Negative.   Cardiovascular: Negative.   Gastrointestinal: Negative.   Genitourinary: Negative.   Musculoskeletal: Negative.   Skin: Negative.   Neurological: Negative.   Endo/Heme/Allergies: Negative.   Psychiatric/Behavioral: Negative.     Immunization History  Administered  Date(s) Administered  . Influenza, High Dose Seasonal PF 08/19/2015, 09/02/2016  . Pneumococcal Conjugate-13 02/06/2014  . Pneumococcal Polysaccharide-23 03/05/2012  . Tdap 04/28/2007    Objective:  BP (!) 142/80 (BP Location: Left Arm, Patient Position: Sitting, Cuff Size: Normal)   Pulse 62   Temp 97.9 F (36.6 C) (Oral)   Resp 16   Wt 210 lb (95.3 kg)   BMI 28.88 kg/m   Physical Exam  Constitutional: He is oriented to person, place, and time and well-developed, well-nourished, and in no distress.  Eyes: Conjunctivae and EOM are normal. Pupils are equal, round, and reactive to light.  Neck: Normal range of motion. Neck supple.  Cardiovascular: Normal rate, regular rhythm, normal  heart sounds and intact distal pulses.   Pulmonary/Chest: Effort normal and breath sounds normal.  Musculoskeletal: Normal range of motion.  Neurological: He is alert and oriented to person, place, and time. He has normal reflexes. Gait normal. GCS score is 15.  Skin: Skin is warm and dry.  Psychiatric: Mood, memory, affect and judgment normal.    Lab Results  Component Value Date   WBC 6.2 03/23/2016   HGB 15.1 03/23/2016   HCT 41.1 03/23/2016   PLT 117 (L) 03/23/2016   GLUCOSE 99 05/24/2016   CHOL 151 10/18/2013   TRIG 101 10/18/2013   HDL 53 10/18/2013   LDLCALC 78 10/18/2013   TSH 0.708 12/24/2015   PSA 3.0 10/22/2014    CMP     Component Value Date/Time   NA 141 05/24/2016 1014   K 3.5 05/24/2016 1014   CL 100 05/24/2016 1014   CO2 23 05/24/2016 1014   GLUCOSE 99 05/24/2016 1014   BUN 10 05/24/2016 1014   CREATININE 0.94 05/24/2016 1014   CALCIUM 9.2 05/24/2016 1014   PROT 7.0 05/24/2016 1014   ALBUMIN 4.1 05/24/2016 1014   AST 26 05/24/2016 1014   ALT 27 05/24/2016 1014   ALKPHOS 59 05/24/2016 1014   BILITOT 0.5 05/24/2016 1014   GFRNONAA 82 05/24/2016 1014   GFRAA 95 05/24/2016 1014    Assessment and Plan :  1. Essential hypertension Start Lisinopril, follow up in 1 month.  - lisinopril (PRINIVIL,ZESTRIL) 10 MG tablet; Take 1 tablet (10 mg total) by mouth daily.  Dispense: 90 tablet; Refill: 3  2. Left arm pain/Impingement syndrome  - EKG 12-Lead  3. Night sweats Pt will keep record of what he has been doing before bed and when he has these sweats and discuss at next OV.  Consider CXR/labs/possible CT/Quantiferon with labs. HPI, Exam, and A&P Transcribed under the direction and in the presence of Richard L. Cranford Mon, MD  Electronically Signed: Katina Dung, CMA I have done the exam and reviewed the above chart and it is accurate to the best of my knowledge. Development worker, community has been used in this note in any air is in the dictation or  transcription are unintentional.  [facility] 03/06/2017 10:52 AM

## 2017-03-23 ENCOUNTER — Ambulatory Visit
Admission: RE | Admit: 2017-03-23 | Discharge: 2017-03-23 | Disposition: A | Discharge status: Home / Self Care | Payer: Medicare Other | Source: Ambulatory Visit | Attending: Family Medicine | Admitting: Family Medicine

## 2017-03-23 ENCOUNTER — Ambulatory Visit (INDEPENDENT_AMBULATORY_CARE_PROVIDER_SITE_OTHER): Payer: Medicare Other | Admitting: Family Medicine

## 2017-03-23 VITALS — BP 122/64 | HR 102 | Temp 100.1°F | Resp 16 | Wt 212.0 lb

## 2017-03-23 DIAGNOSIS — R059 Cough, unspecified: Secondary | ICD-10-CM

## 2017-03-23 DIAGNOSIS — J984 Other disorders of lung: Secondary | ICD-10-CM | POA: Insufficient documentation

## 2017-03-23 DIAGNOSIS — J449 Chronic obstructive pulmonary disease, unspecified: Secondary | ICD-10-CM | POA: Insufficient documentation

## 2017-03-23 DIAGNOSIS — R509 Fever, unspecified: Secondary | ICD-10-CM | POA: Insufficient documentation

## 2017-03-23 DIAGNOSIS — R05 Cough: Secondary | ICD-10-CM

## 2017-03-23 DIAGNOSIS — R918 Other nonspecific abnormal finding of lung field: Secondary | ICD-10-CM | POA: Insufficient documentation

## 2017-03-23 MED ORDER — DOXYCYCLINE HYCLATE 100 MG PO TABS: 100.0000 mg | ORAL_TABLET | Freq: Two times a day (BID) | ORAL | 0 refills | Status: DC

## 2017-03-23 NOTE — Progress Notes (Addendum)
Subjective:     Patient ID: Dwayne Thompson, male   DOB: 07/02/1946, 71 y.o.   MRN: [id number]  HPI  Chief Complaint  Patient presents with  . Fever    Patient states he has not felt well and has had low energy for a few days and then woke up yesterday July 4th with fever 101-101.9, cough-mainly dry, runny nose-clear mucus, headache, chills and sweats, some left hand pain and lower back pain. He has been taking Tylenol for fever. Denies sore throat, sinus pain or pressure, no chest tightness or wheezing, no ear pain, no flu like body aches.  States he spends time outside but does not recall having a tick on him. Reports symptoms started with coughing. Accompanied by his wife today. Reports stopping smoking 3 years ago.   Review of Systems     Objective:   Physical Exam  Constitutional: He appears well-developed and well-nourished. No distress.  Ears: Left T.M intact without inflammation; R TM obscured by cerumen Throat: no tonsillar enlargement or exudate Neck: no cervical adenopathy Lungs: Bi-basilar crackles.     Assessment:    1. Cough - DG Chest 2 View; Future - doxycycline (VIBRA-TABS) 100 MG tablet; Take 1 tablet (100 mg total) by mouth 2 (two) times daily.  Dispense: 20 tablet; Refill: 0  2. Fever, unspecified fever cause: will cover for tick fever - DG Chest 2 View; Future - doxycycline (VIBRA-TABS) 100 MG tablet; Take 1 tablet (100 mg total) by mouth 2 (two) times daily.  Dispense: 20 tablet; Refill: 0    Plan:    Further f/u pending x-ray report.

## 2017-03-23 NOTE — Patient Instructions (Signed)
We will call you with the x-ray report. Add Mucinex and Delysm.

## 2017-03-27 ENCOUNTER — Ambulatory Visit (INDEPENDENT_AMBULATORY_CARE_PROVIDER_SITE_OTHER): Payer: Medicare Other | Admitting: Family Medicine

## 2017-03-27 ENCOUNTER — Encounter: Payer: Self-pay | Admitting: Family Medicine

## 2017-03-27 VITALS — BP 132/80 | HR 78 | Temp 98.6°F | Resp 16 | Wt 209.0 lb

## 2017-03-27 DIAGNOSIS — J189 Pneumonia, unspecified organism: Secondary | ICD-10-CM

## 2017-03-27 DIAGNOSIS — J181 Lobar pneumonia, unspecified organism: Secondary | ICD-10-CM | POA: Diagnosis not present

## 2017-03-27 MED ORDER — HYDROCODONE-HOMATROPINE 5-1.5 MG/5ML PO SYRP: ORAL_SOLUTION | ORAL | 0 refills | Status: DC

## 2017-03-27 NOTE — Patient Instructions (Signed)
Continue with doxycycline. Don't forget the follow up chest xray in 3-4 weeks.

## 2017-03-27 NOTE — Progress Notes (Addendum)
Subjective:     Patient ID: Dwayne Thompson, male   DOB: 09-02-1946, 71 y.o.   MRN: [id number]  HPI  Chief Complaint  Patient presents with  . Pneumonia    Follow-up   States fever and chills have abated. Cough remains non-productive but is keeping him up at night. 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 Accompanied by his wife today.   Review of Systems     Objective:   Physical Exam  Constitutional: He appears well-developed and well-nourished. No distress.  Pulmonary/Chest: He has no wheezes.  Right basilar coarse crackles. O/w clear       Assessment:    1. Pneumonia of left upper lobe due to infectious organism [facility]): improving - HYDROcodone-homatropine (HYCODAN) 5-1.5 MG/5ML syrup; 5 ml 4-6 hours as needed for cough  Dispense: 240 mL; Refill: 0    Plan:    Repeat CXR in 3 weeks.

## 2017-04-07 ENCOUNTER — Ambulatory Visit (INDEPENDENT_AMBULATORY_CARE_PROVIDER_SITE_OTHER): Payer: Medicare Other | Admitting: Family Medicine

## 2017-04-07 ENCOUNTER — Encounter: Payer: Self-pay | Admitting: Family Medicine

## 2017-04-07 VITALS — BP 100/68 | HR 88 | Temp 97.9°F | Resp 16 | Wt 205.8 lb

## 2017-04-07 DIAGNOSIS — R5383 Other fatigue: Secondary | ICD-10-CM | POA: Diagnosis not present

## 2017-04-07 DIAGNOSIS — Z8701 Personal history of pneumonia (recurrent): Secondary | ICD-10-CM | POA: Diagnosis not present

## 2017-04-07 DIAGNOSIS — J069 Acute upper respiratory infection, unspecified: Secondary | ICD-10-CM

## 2017-04-07 NOTE — Progress Notes (Signed)
Subjective:     Patient ID: Dwayne Thompson, male   DOB: 1946-04-11, 71 y.o.   MRN: [id number]  HPI  Chief Complaint  Patient presents with  . Pneumonia    Patient returns to office today for follow up visit, patient was last seen on 03/27/17 and diagnosed with pneumonia and was prescribed medication. Patient reports that he completed medication and that cough cleared but has now returned. Wife who is accompanied with patient stated that patient has been lethargic, loss of energy and fatigue, low grade temp readings at home and has been hoarse.   States has continued to feel fatigued though felt better for a couple of days after completing abx. In the last few days has developed throat discomfort, sinus congestion and hoarseness. Accompanied by his wife today.   Review of Systems     Objective:   Physical Exam  Constitutional: He appears well-developed and well-nourished. No distress.  Ears: T.M's intact without inflammation Throat: no tonsillar enlargement or exudate Neck: no cervical adenopathy Lungs: clear     Assessment:    1. Viral upper respiratory tract infection  2. Hx of bacterial pneumonia - CBC with Differential/Platelet  3. Other fatigue - CBC with Differential/Platelet - Comprehensive metabolic panel    Plan:    Discussed use of Mucinex D and Delsym for current sx. Call for repeat CXR in one week. Further f/u pending lab work. He will be seeing his primary M.D., Dr. Rosanna Randy, on 04/10/17.

## 2017-04-07 NOTE — Patient Instructions (Addendum)
Discussed use of Mucinex D for congestion and Delsym for cough. Remember to call for a follow up chest x-ray in one week.

## 2017-04-08 LAB — CBC WITH DIFFERENTIAL/PLATELET
Basophils Absolute: 0 10*3/uL (ref 0.0–0.2)
Basos: 0 %
EOS (ABSOLUTE): 0.3 10*3/uL (ref 0.0–0.4)
Eos: 4 %
Hematocrit: 44.1 % (ref 37.5–51.0)
Hemoglobin: 15.3 g/dL (ref 13.0–17.7)
Immature Grans (Abs): 0 10*3/uL (ref 0.0–0.1)
Immature Granulocytes: 0 %
Lymphocytes Absolute: 2.2 10*3/uL (ref 0.7–3.1)
Lymphs: 31 %
MCH: 31.9 pg (ref 26.6–33.0)
MCHC: 34.7 g/dL (ref 31.5–35.7)
MCV: 92 fL (ref 79–97)
Monocytes Absolute: 0.5 10*3/uL (ref 0.1–0.9)
Monocytes: 8 %
Neutrophils Absolute: 3.9 10*3/uL (ref 1.4–7.0)
Neutrophils: 57 %
Platelets: 221 10*3/uL (ref 150–379)
RBC: 4.8 x10E6/uL (ref 4.14–5.80)
RDW: 14.4 % (ref 12.3–15.4)
WBC: 7 10*3/uL (ref 3.4–10.8)

## 2017-04-08 LAB — COMPREHENSIVE METABOLIC PANEL
ALT: 31 IU/L (ref 0–44)
AST: 26 IU/L (ref 0–40)
Albumin/Globulin Ratio: 1.2 (ref 1.2–2.2)
Albumin: 4.2 g/dL (ref 3.5–4.8)
Alkaline Phosphatase: 65 IU/L (ref 39–117)
BUN/Creatinine Ratio: 13 (ref 10–24)
BUN: 14 mg/dL (ref 8–27)
Bilirubin Total: 0.3 mg/dL (ref 0.0–1.2)
CO2: 22 mmol/L (ref 20–29)
Calcium: 9.7 mg/dL (ref 8.6–10.2)
Chloride: 101 mmol/L (ref 96–106)
Creatinine, Ser: 1.09 mg/dL (ref 0.76–1.27)
GFR calc Af Amer: 79 mL/min/{1.73_m2} (ref >59)
GFR calc non Af Amer: 68 mL/min/{1.73_m2} (ref >59)
Globulin, Total: 3.4 g/dL (ref 1.5–4.5)
Glucose: 107 mg/dL — ABNORMAL HIGH (ref 65–99)
Potassium: 4.3 mmol/L (ref 3.5–5.2)
Sodium: 139 mmol/L (ref 134–144)
Total Protein: 7.6 g/dL (ref 6.0–8.5)

## 2017-04-10 ENCOUNTER — Encounter: Payer: Self-pay | Admitting: Family Medicine

## 2017-04-10 ENCOUNTER — Ambulatory Visit: Payer: Medicare Other | Admitting: Family Medicine

## 2017-04-10 ENCOUNTER — Ambulatory Visit (INDEPENDENT_AMBULATORY_CARE_PROVIDER_SITE_OTHER): Payer: Medicare Other | Admitting: Family Medicine

## 2017-04-10 VITALS — BP 110/68 | HR 72 | Temp 97.9°F | Resp 16 | Wt 205.0 lb

## 2017-04-10 DIAGNOSIS — I10 Essential (primary) hypertension: Secondary | ICD-10-CM | POA: Diagnosis not present

## 2017-04-10 DIAGNOSIS — R61 Generalized hyperhidrosis: Secondary | ICD-10-CM

## 2017-04-10 DIAGNOSIS — J189 Pneumonia, unspecified organism: Secondary | ICD-10-CM

## 2017-04-10 DIAGNOSIS — J181 Lobar pneumonia, unspecified organism: Secondary | ICD-10-CM | POA: Diagnosis not present

## 2017-04-10 NOTE — Patient Instructions (Signed)
You should continue to improve and get your strength back over the next month. We will get chest xray at next office visit.

## 2017-04-10 NOTE — Progress Notes (Signed)
Subjective:  HPI  Hypertension- last OV 03/06/17 st lisinopril follow up in 1 month. Left arm pain, checked EKG. He is doing well on Lisinopril. Night sweats- keep record of what he does before bed and when he has sweats and discuss next OV.   Since last OV. Pt was seen on 03/23/17 for cough. Started Doxy, Checked CXR. Pneumonia. Follow up CXR 3-4 weeks. Then saw Mikki Santee on 04/07/17 for URI checked CBC, Met C for fatigue.   Pt reports that he is feeling better today. He is occupied by his wife today, she reports that she noticed a turn around in him yesterday when he had more energy and went to church. They are concerned about his temperature. It is running 95 degrees at home with a digital oral thermometer.    Prior to Admission medications   Medication Sig Start Date End Date Taking? Authorizing Provider  aspirin 81 MG tablet Take by mouth.    [provider]  Cholecalciferol 1000 UNITS tablet Take by mouth. Reported on 03/03/2016    [provider]  doxazosin (CARDURA) 4 MG tablet TAKE 1 TABLET DAILY 04/19/16   Jerrol Banana., MD  HYDROcodone-homatropine [facility]) 5-1.5 MG/5ML syrup 5 ml 4-6 hours as needed for cough Patient not taking: Reported on 04/07/2017 03/27/17   Carmon Ginsberg, PA  IBU 800 MG tablet TAKE 1 TABLET THREE TIMES A DAY AS NEEDED 02/27/17   Jerrol Banana., MD  lisinopril (PRINIVIL,ZESTRIL) 10 MG tablet Take 1 tablet (10 mg total) by mouth daily. 03/06/17   Jerrol Banana., MD  meloxicam (MOBIC) 7.5 MG tablet Take 7.5 mg by mouth 2 (two) times daily.    [provider]  tadalafil (CIALIS) 5 MG tablet Take one tablet daily for BPH 06/02/16   Nori Riis, PA-C    Patient Active Problem List   Diagnosis Date Noted  . Neck pain on right side 03/23/2016  . History of tobacco abuse 03/23/2016  . Cervical lymphadenitis 03/23/2016  . 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 Erectile dysfunction of organic  origin 12/24/2015  . 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 Allergic rhinitis 03/28/2015  . 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 Failure of erection 03/28/2015  . Acid reflux 03/28/2015  . Arthritis, degenerative 03/28/2015  . Change in blood platelet count 03/28/2015  . Temporary cerebral vascular dysfunction 03/28/2015  . Personal history of tobacco use, presenting hazards to health 03/28/2015    Past Medical History:  Diagnosis Date  . Back pain   . BPH (benign prostatic hyperplasia)   . ED (erectile dysfunction)     Social History   Social History  . Marital status: Married    Spouse name: [name]  . Number of children: 3  . Years of education: 12   Occupational History  . retired    Social History Main Topics  . Smoking status: Former Smoker    Packs/day: 1.00    Years: 50.00    Types: Cigarettes    Quit date: 10/20/2013  . Smokeless tobacco: Never Used  . Alcohol use Yes     Comment: daily, 3 to 4 drinks daily, usually gin and chases it down with soda  . Drug use: No  . Sexual activity: Not Currently   Other Topics Concern  . Not on file   Social History Narrative  . No narrative on file    Allergies  Allergen Reactions  . Tamsulosin  Hcl Hives    Review of Systems  Constitutional: Positive for malaise/fatigue.  HENT: Negative.   Eyes: Negative.   Respiratory: Positive for cough.   Cardiovascular: Negative.   Gastrointestinal: Negative.   Genitourinary: Negative.   Musculoskeletal: Negative.   Skin: Negative.   Neurological: Negative.   Endo/Heme/Allergies: Negative.   Psychiatric/Behavioral: Negative.     Immunization History  Administered Date(s) Administered  . Influenza, High Dose Seasonal PF 08/19/2015, 09/02/2016  . Pneumococcal Conjugate-13 02/06/2014  . Pneumococcal Polysaccharide-23 03/05/2012  . Tdap 04/28/2007    Objective:  BP 110/68 (BP Location: Left Arm, Patient Position: Sitting, Cuff Size: Normal)    Pulse 72   Temp 97.9 F (36.6 C) (Oral)   Resp 16   Wt 205 lb (93 kg)   SpO2 98%   BMI 28.19 kg/m   Physical Exam  Constitutional: He is oriented to person, place, and time and well-developed, well-nourished, and in no distress.  Eyes: Pupils are equal, round, and reactive to light. Conjunctivae and EOM are normal.  Neck: Normal range of motion. Neck supple.  Cardiovascular: Normal rate, regular rhythm, normal heart sounds and intact distal pulses.   Pulmonary/Chest: Effort normal and breath sounds normal.  Musculoskeletal: Normal range of motion.  Neurological: He is alert and oriented to person, place, and time. He has normal reflexes. Gait normal. GCS score is 15.  Skin: Skin is warm and dry.  Psychiatric: Mood, memory, affect and judgment normal.    Lab Results  Component Value Date   WBC 7.0 04/07/2017   HGB 15.3 04/07/2017   HCT 44.1 04/07/2017   PLT 221 04/07/2017   GLUCOSE 107 (H) 04/07/2017   CHOL 151 10/18/2013   TRIG 101 10/18/2013   HDL 53 10/18/2013   LDLCALC 78 10/18/2013   TSH 0.708 12/24/2015   PSA 3.0 10/22/2014    CMP     Component Value Date/Time   NA 139 04/07/2017 1440   K 4.3 04/07/2017 1440   CL 101 04/07/2017 1440   CO2 22 04/07/2017 1440   GLUCOSE 107 (H) 04/07/2017 1440   BUN 14 04/07/2017 1440   CREATININE 1.09 04/07/2017 1440   CALCIUM 9.7 04/07/2017 1440   PROT 7.6 04/07/2017 1440   ALBUMIN 4.2 04/07/2017 1440   AST 26 04/07/2017 1440   ALT 31 04/07/2017 1440   ALKPHOS 65 04/07/2017 1440   BILITOT 0.3 04/07/2017 1440   GFRNONAA 68 04/07/2017 1440   GFRAA 79 04/07/2017 1440    Assessment and Plan :  1. Pneumonia of left upper lobe due to infectious organism [facility]) Will get repeat CXR  in 1 month.  2. Essential hypertension Improved. Continue current medication  3. Night sweats Improving.    HPI, Exam, and A&P Transcribed under the direction and in the presence of Birl Lobello L. Cranford Mon, MD  Electronically Signed: Katina Dung, [place] MD [facility] 04/10/2017 11:15 AM

## 2017-04-14 ENCOUNTER — Other Ambulatory Visit: Payer: Self-pay | Admitting: Family Medicine

## 2017-04-17 ENCOUNTER — Ambulatory Visit: Payer: Medicare Other | Admitting: Family Medicine

## 2017-04-24 ENCOUNTER — Telehealth: Payer: Self-pay | Admitting: *Deleted

## 2017-04-24 DIAGNOSIS — Z122 Encounter for screening for malignant neoplasm of respiratory organs: Secondary | ICD-10-CM

## 2017-04-24 NOTE — Telephone Encounter (Signed)
Notified patient that annual lung cancer screening low dose CT scan is due currently or will be in near future. Confirmed that patient is within the age range of 55-77, and asymptomatic, (no signs or symptoms of lung cancer). Patient denies illness that would prevent curative treatment for lung cancer if found. Verified smoking history, (former, quit 2015, 50 pack year). The shared decision making visit was done 04/19/16. Patient is agreeable for CT scan being scheduled.

## 2017-05-02 ENCOUNTER — Ambulatory Visit: Payer: Medicare Other | Attending: Oncology

## 2017-05-15 ENCOUNTER — Ambulatory Visit
Admission: RE | Admit: 2017-05-15 | Discharge: 2017-05-15 | Disposition: A | Discharge status: Home / Self Care | Payer: Medicare Other | Source: Ambulatory Visit | Attending: Family Medicine | Admitting: Family Medicine

## 2017-05-15 ENCOUNTER — Ambulatory Visit (INDEPENDENT_AMBULATORY_CARE_PROVIDER_SITE_OTHER): Payer: Medicare Other | Admitting: Family Medicine

## 2017-05-15 VITALS — BP 118/62 | HR 60 | Temp 98.3°F | Resp 14 | Wt 209.0 lb

## 2017-05-15 DIAGNOSIS — J189 Pneumonia, unspecified organism: Secondary | ICD-10-CM

## 2017-05-15 DIAGNOSIS — Z8701 Personal history of pneumonia (recurrent): Secondary | ICD-10-CM | POA: Insufficient documentation

## 2017-05-15 DIAGNOSIS — J181 Lobar pneumonia, unspecified organism: Secondary | ICD-10-CM

## 2017-05-15 DIAGNOSIS — R61 Generalized hyperhidrosis: Secondary | ICD-10-CM

## 2017-05-15 DIAGNOSIS — J439 Emphysema, unspecified: Secondary | ICD-10-CM | POA: Insufficient documentation

## 2017-05-15 DIAGNOSIS — M7542 Impingement syndrome of left shoulder: Secondary | ICD-10-CM

## 2017-05-15 DIAGNOSIS — I10 Essential (primary) hypertension: Secondary | ICD-10-CM | POA: Diagnosis not present

## 2017-05-15 DIAGNOSIS — Z09 Encounter for follow-up examination after completed treatment for conditions other than malignant neoplasm: Secondary | ICD-10-CM | POA: Diagnosis not present

## 2017-05-15 NOTE — Patient Instructions (Signed)
Follow up with Dr Mack Guise for left shoulder/arm pain.

## 2017-05-15 NOTE — Progress Notes (Signed)
Dwayne Thompson  MRN: [id number] DOB: [birth year]  Subjective:  HPI  Patient is here for 1 month follow up on Pneumonia. Last office visit was on 04/10/17. Last Chest Xray was done on 03/23/17-needs follow up chest xray. Patient states he is feeling better. No cough, shortness of breath, or chest pain. He is still having night sweats that has been an issue for a while. This is not better about the same. He is starting to have sensation of feeling hot a lot during the day. Wt Readings from Last 3 Encounters:  05/15/17 209 lb (94.8 kg)  04/10/17 205 lb (93 kg)  04/07/17 205 lb 12.8 oz (93.4 kg)    Patient Active Problem List   Diagnosis Date Noted  . Neck pain on right side 03/23/2016  . History of tobacco abuse 03/23/2016  . Cervical lymphadenitis 03/23/2016  . 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 Allergic rhinitis 03/28/2015  . 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 Failure of erection 03/28/2015  . Acid reflux 03/28/2015  . Arthritis, degenerative 03/28/2015  . Change in blood platelet count 03/28/2015  . Temporary cerebral vascular dysfunction 03/28/2015  . Personal history of tobacco use, presenting hazards to health 03/28/2015    Past Medical History:  Diagnosis Date  . Back pain   . BPH (benign prostatic hyperplasia)   . ED (erectile dysfunction)     Social History   Social History  . Marital status: Married    Spouse name: [name]  . Number of children: 3  . Years of education: 12   Occupational History  . retired    Social History Main Topics  . Smoking status: Former Smoker    Packs/day: 1.00    Years: 50.00    Types: Cigarettes    Quit date: 10/20/2013  . Smokeless tobacco: Never Used  . Alcohol use Yes     Comment: daily, 3 to 4 drinks daily, usually gin and chases it down with soda  . Drug use: No  . Sexual activity:  Not Currently   Other Topics Concern  . Not on file   Social History Narrative  . No narrative on file    Outpatient Encounter Prescriptions as of 05/15/2017  Medication Sig Note  . aspirin 81 MG tablet Take by mouth. 03/28/2015: Received from: [facility]  . Cholecalciferol 1000 UNITS tablet Take by mouth. Reported on 03/03/2016 03/28/2015: Received from: [facility]  . doxazosin (CARDURA) 4 MG tablet TAKE 1 TABLET DAILY   . IBU 800 MG tablet TAKE 1 TABLET THREE TIMES A DAY AS NEEDED   . lisinopril (PRINIVIL,ZESTRIL) 10 MG tablet Take 1 tablet (10 mg total) by mouth daily.   . [DISCONTINUED] HYDROcodone-homatropine (HYCODAN) 5-1.5 MG/5ML syrup 5 ml 4-6 hours as needed for cough   . [DISCONTINUED] meloxicam (MOBIC) 7.5 MG tablet Take 7.5 mg by mouth 2 (two) times daily.   . [DISCONTINUED] tadalafil (CIALIS) 5 MG tablet Take one tablet daily for BPH    No facility-administered encounter medications on file as of 05/15/2017.     Allergies  Allergen Reactions  . Tamsulosin Hcl Hives    Review of Systems  Constitutional: Negative.   Respiratory: Negative.   Cardiovascular: Negative.   Gastrointestinal: Negative.   Musculoskeletal: Positive for joint pain (left arm).  Neurological: Negative.   Psychiatric/Behavioral: Negative.     Objective:  BP  118/62   Pulse 60   Temp 98.3 F (36.8 C)   Resp 14   Wt 209 lb (94.8 kg)   SpO2 99%   BMI 28.74 kg/m   Physical Exam  Constitutional: He is oriented to person, place, and time and well-developed, well-nourished, and in no distress.  HENT:  Head: Normocephalic and atraumatic.  Right Ear: External ear normal.  Left Ear: External ear normal.  Eyes: Pupils are equal, round, and reactive to light. Conjunctivae are normal.  Neck: Normal range of motion. Neck supple.  Cardiovascular: Normal rate, regular rhythm, normal heart sounds and intact distal pulses.  Exam reveals no gallop.   No murmur  heard. Pulmonary/Chest: Effort normal and breath sounds normal. No respiratory distress. He has no wheezes.  Abdominal: Soft.  Musculoskeletal:       Left shoulder: He exhibits decreased range of motion and tenderness.  Pain with abduction of left shoulder past 90  Neurological: He is alert and oriented to person, place, and time.    Assessment and Plan :  1. Pneumonia of left upper lobe due to infectious organism [facility]) Doing better. Re check Chest Xray today. - DG Chest 2 View; Future  2. Impingement syndrome of shoulder, left Follow. Patient had work up with Dr Mack Guise. Patient advised to follow back up with him.  3. Essential hypertension Stable. - CBC with Differential/Platelet  4. Night sweats Check lab work. Will follow. Pending results. Mild symptom. - Quantiferon tb gold assay - CBC with Differential/Platelet 5.COPD  HPI, Exam and A&P transcribed by Theressa Millard, RMA under direction and in the presence of [name], MD. I have done the exam and reviewed the chart and it is accurate to the best of my knowledge. Development worker, community has been used and  any errors in dictation or transcription are unintentional. [name] M.D. [facility]

## 2017-05-17 ENCOUNTER — Telehealth: Payer: Self-pay | Admitting: *Deleted

## 2017-05-17 DIAGNOSIS — Z122 Encounter for screening for malignant neoplasm of respiratory organs: Secondary | ICD-10-CM

## 2017-05-17 DIAGNOSIS — Z87891 Personal history of nicotine dependence: Secondary | ICD-10-CM

## 2017-05-17 NOTE — Telephone Encounter (Signed)
Notified patient that annual lung cancer screening low dose CT scan is due currently or will be in near future. Confirmed that patient is within the age range of 55-77, and asymptomatic, (no signs or symptoms of lung cancer). Patient denies illness that would prevent curative treatment for lung cancer if found. Verified smoking history, (former, quit 2015, 50 pack year). The shared decision making visit was done 04/19/16. Patient is agreeable for CT scan being scheduled.

## 2017-05-19 LAB — CBC WITH DIFFERENTIAL/PLATELET
Basophils Absolute: 0 10*3/uL (ref 0.0–0.2)
Basos: 0 %
EOS (ABSOLUTE): 0.2 10*3/uL (ref 0.0–0.4)
Eos: 3 %
Hematocrit: 44.5 % (ref 37.5–51.0)
Hemoglobin: 15.1 g/dL (ref 13.0–17.7)
Immature Grans (Abs): 0 10*3/uL (ref 0.0–0.1)
Immature Granulocytes: 1 %
Lymphocytes Absolute: 1.9 10*3/uL (ref 0.7–3.1)
Lymphs: 41 %
MCH: 32.1 pg (ref 26.6–33.0)
MCHC: 33.9 g/dL (ref 31.5–35.7)
MCV: 95 fL (ref 79–97)
Monocytes Absolute: 0.4 10*3/uL (ref 0.1–0.9)
Monocytes: 8 %
Neutrophils Absolute: 2.2 10*3/uL (ref 1.4–7.0)
Neutrophils: 47 %
Platelets: 134 10*3/uL — ABNORMAL LOW (ref 150–379)
RBC: 4.71 x10E6/uL (ref 4.14–5.80)
RDW: 16.8 % — ABNORMAL HIGH (ref 12.3–15.4)
WBC: 4.7 10*3/uL (ref 3.4–10.8)

## 2017-05-19 LAB — QUANTIFERON IN TUBE
QFT TB AG MINUS NIL VALUE: 0 IU/mL
QUANTIFERON MITOGEN VALUE: >10 IU/mL
QUANTIFERON TB AG VALUE: 0.02 IU/mL
QUANTIFERON TB GOLD: NEGATIVE
Quantiferon Nil Value: 0.02 IU/mL

## 2017-05-19 LAB — QUANTIFERON TB GOLD ASSAY (BLOOD)

## 2017-06-02 ENCOUNTER — Ambulatory Visit
Admission: RE | Admit: 2017-06-02 | Discharge: 2017-06-02 | Disposition: A | Discharge status: Home / Self Care | Payer: Medicare Other | Source: Ambulatory Visit | Attending: Oncology | Admitting: Oncology

## 2017-06-02 DIAGNOSIS — I2510 Atherosclerotic heart disease of native coronary artery without angina pectoris: Secondary | ICD-10-CM | POA: Diagnosis not present

## 2017-06-02 DIAGNOSIS — Z122 Encounter for screening for malignant neoplasm of respiratory organs: Secondary | ICD-10-CM | POA: Insufficient documentation

## 2017-06-02 DIAGNOSIS — R918 Other nonspecific abnormal finding of lung field: Secondary | ICD-10-CM | POA: Diagnosis not present

## 2017-06-02 DIAGNOSIS — I700 Atherosclerosis of aorta: Secondary | ICD-10-CM | POA: Insufficient documentation

## 2017-06-02 DIAGNOSIS — Z87891 Personal history of nicotine dependence: Secondary | ICD-10-CM | POA: Diagnosis present

## 2017-06-02 DIAGNOSIS — J439 Emphysema, unspecified: Secondary | ICD-10-CM | POA: Diagnosis not present

## 2017-06-05 ENCOUNTER — Telehealth: Payer: Self-pay | Admitting: *Deleted

## 2017-06-05 NOTE — Telephone Encounter (Signed)
Notified patient of LDCT lung cancer screening program results with recommendation for 3 month follow up imaging. Also notified of incidental findings noted below and is encouraged to discuss further with PCP who will receive a copy of this note and/or the CT report. Patient verbalizes understanding.   IMPRESSION: 1. Lung-RADS 4A-S, suspicious. New irregular nodular bandlike opacity in the anterior apical left upper lobe, nonspecific, favored to represent evolving postinfectious scarring. Follow up low-dose chest CT without contrast in 3 months (please use the following order, "CT CHEST LCS NODULE FOLLOW-UP W/O CM") is recommended. 2. The "S" modifier above refers to potentially clinically significant non lung cancer related findings. Specifically, 1 vessel coronary atherosclerosis.  Aortic Atherosclerosis (ICD10-I70.0) and Emphysema (ICD10-J43.9).

## 2017-08-21 ENCOUNTER — Telehealth: Payer: Self-pay | Admitting: *Deleted

## 2017-08-21 NOTE — Telephone Encounter (Signed)
Left message for patient to notify them that it is time to schedule low dose lung cancer screening CT scan follow up imaging. Instructed patient to call back to verify information prior to the scan being scheduled.

## 2017-08-22 ENCOUNTER — Telehealth: Payer: Self-pay | Admitting: *Deleted

## 2017-08-22 DIAGNOSIS — Z122 Encounter for screening for malignant neoplasm of respiratory organs: Secondary | ICD-10-CM

## 2017-08-22 DIAGNOSIS — Z87891 Personal history of nicotine dependence: Secondary | ICD-10-CM

## 2017-08-22 DIAGNOSIS — R918 Other nonspecific abnormal finding of lung field: Secondary | ICD-10-CM

## 2017-08-22 NOTE — Telephone Encounter (Signed)
Notified patient that lung cancer screening low dose CT scan followup imaging is due currently or will be in near future. Confirmed that patient is within the age range of 55-77, and asymptomatic, (no signs or symptoms of lung cancer). Patient denies illness that would prevent curative treatment for lung cancer if found. Verified smoking history, (former, quit 2015, 50 pack year). The shared decision making visit was done 04/19/16. Patient is agreeable for CT scan being scheduled.

## 2017-09-01 ENCOUNTER — Ambulatory Visit
Admission: RE | Admit: 2017-09-01 | Discharge: 2017-09-01 | Disposition: A | Discharge status: Home / Self Care | Payer: Medicare Other | Source: Ambulatory Visit | Attending: Oncology | Admitting: Oncology

## 2017-09-01 DIAGNOSIS — I2510 Atherosclerotic heart disease of native coronary artery without angina pectoris: Secondary | ICD-10-CM | POA: Diagnosis not present

## 2017-09-01 DIAGNOSIS — Z122 Encounter for screening for malignant neoplasm of respiratory organs: Secondary | ICD-10-CM

## 2017-09-01 DIAGNOSIS — J439 Emphysema, unspecified: Secondary | ICD-10-CM | POA: Insufficient documentation

## 2017-09-01 DIAGNOSIS — I700 Atherosclerosis of aorta: Secondary | ICD-10-CM | POA: Diagnosis not present

## 2017-09-01 DIAGNOSIS — Z87891 Personal history of nicotine dependence: Secondary | ICD-10-CM

## 2017-09-01 DIAGNOSIS — R918 Other nonspecific abnormal finding of lung field: Secondary | ICD-10-CM | POA: Diagnosis present

## 2017-09-04 ENCOUNTER — Encounter: Payer: Self-pay | Admitting: *Deleted

## 2017-10-03 ENCOUNTER — Ambulatory Visit (INDEPENDENT_AMBULATORY_CARE_PROVIDER_SITE_OTHER): Payer: Medicare Other | Admitting: Family Medicine

## 2017-10-03 ENCOUNTER — Other Ambulatory Visit: Payer: Self-pay

## 2017-10-03 DIAGNOSIS — Z23 Encounter for immunization: Secondary | ICD-10-CM | POA: Diagnosis not present

## 2017-10-03 DIAGNOSIS — I10 Essential (primary) hypertension: Secondary | ICD-10-CM

## 2017-10-03 MED ORDER — LISINOPRIL 10 MG PO TABS: 10.0000 mg | ORAL_TABLET | Freq: Every day | ORAL | 3 refills | Status: DC

## 2017-10-03 NOTE — Telephone Encounter (Signed)
Patient requests a refill  on  Lisinopril 10mg , patient states that he only has one day left of medicine. Patient uses [facility] and request a call back once prescription is sent. KW

## 2017-10-03 NOTE — Telephone Encounter (Signed)
Left message notifying pt rx was sent to pharmacy.

## 2017-11-06 ENCOUNTER — Ambulatory Visit (INDEPENDENT_AMBULATORY_CARE_PROVIDER_SITE_OTHER): Payer: Medicare Other | Admitting: Family Medicine

## 2017-11-06 ENCOUNTER — Encounter: Payer: Self-pay | Admitting: Family Medicine

## 2017-11-06 VITALS — BP 142/62 | HR 88 | Temp 98.9°F | Resp 16 | Ht 72.0 in | Wt 212.0 lb

## 2017-11-06 DIAGNOSIS — J069 Acute upper respiratory infection, unspecified: Secondary | ICD-10-CM | POA: Diagnosis not present

## 2017-11-06 DIAGNOSIS — I700 Atherosclerosis of aorta: Secondary | ICD-10-CM

## 2017-11-06 DIAGNOSIS — J439 Emphysema, unspecified: Secondary | ICD-10-CM

## 2017-11-06 DIAGNOSIS — I10 Essential (primary) hypertension: Secondary | ICD-10-CM

## 2017-11-06 MED ORDER — ROSUVASTATIN CALCIUM 10 MG PO TABS: 10.0000 mg | ORAL_TABLET | Freq: Every day | ORAL | 3 refills | Status: DC

## 2017-11-06 NOTE — Patient Instructions (Addendum)
Continue Mucinex and add Benadryl at bedtime.

## 2017-11-06 NOTE — Progress Notes (Signed)
Patient: Dwayne Thompson Male    DOB: 04-Mar-1946   72 y.o.   MRN: [id number] Visit Date: 11/06/2017  Today's Provider: [name], MD   Chief Complaint  Patient presents with  . Hypertension  . URI   Subjective:    HPI Patient comes in today for a 6 month follow up on HTN. Patient does not check his BP at home, but feels that his BP remains stable. He currently takes Lisinopril 10mg  daily, and reports good symptom control.  Patient also mentions that he has had a URI for about 1 week. He has cough, congestion, runny nose, and scratchy throat. He denies any fevers. He has been taking Mucinex fast track OTC since yesterday.  Aortic ASCVD found on screening chest CT.     Allergies  Allergen Reactions  . Tamsulosin Hcl Hives     Current Outpatient Medications:  .  aspirin 81 MG tablet, Take by mouth., Disp: , Rfl:  .  Cholecalciferol 1000 UNITS tablet, Take by mouth. Reported on 03/03/2016, Disp: , Rfl:  .  doxazosin (CARDURA) 4 MG tablet, TAKE 1 TABLET DAILY, Disp: 90 tablet, Rfl: 3 .  IBU 800 MG tablet, TAKE 1 TABLET THREE TIMES A DAY AS NEEDED, Disp: 270 tablet, Rfl: 2 .  lisinopril (PRINIVIL,ZESTRIL) 10 MG tablet, Take 1 tablet (10 mg total) by mouth daily., Disp: 90 tablet, Rfl: 3  Review of Systems  Constitutional: Positive for activity change and fatigue. Negative for chills, diaphoresis and fever.  HENT: Positive for congestion, postnasal drip, rhinorrhea, sneezing and sore throat.   Eyes: Negative.   Respiratory: Positive for cough.   Cardiovascular: Negative for chest pain, palpitations and leg swelling.  Gastrointestinal: Negative.   Endocrine: Negative.   Musculoskeletal: Negative.   Allergic/Immunologic: Negative.   Neurological: Positive for headaches. Negative for dizziness.  Psychiatric/Behavioral: Negative.     Social History   Tobacco Use  . Smoking status: Former Smoker    Packs/day: 1.00    Years: 50.00    Pack years: 50.00    Types:  Cigarettes    Last attempt to quit: 10/20/2013    Years since quitting: 4.0  . Smokeless tobacco: Never Used  Substance Use Topics  . Alcohol use: Yes    Comment: daily, 3 to 4 drinks daily, usually gin and chases it down with soda   Objective:   BP (!) 142/62 (BP Location: Right Arm, Patient Position: Sitting, Cuff Size: Normal)   Pulse 88   Temp 98.9 F (37.2 C)   Resp 16   Ht 6' (1.829 m)   Wt 212 lb (96.2 kg)   BMI 28.75 kg/m  Vitals:   11/06/17 1014  BP: (!) 142/62  Pulse: 88  Resp: 16  Temp: 98.9 F (37.2 C)  Weight: 212 lb (96.2 kg)  Height: 6' (1.829 m)     Physical Exam  Constitutional: He is oriented to person, place, and time. He appears well-developed and well-nourished.  HENT:  Head: Normocephalic and atraumatic.  Right Ear: External ear normal.  Left Ear: External ear normal.  Nose: Nose normal.  Mouth/Throat: Oropharynx is clear and moist.  Eyes: Conjunctivae are normal. No scleral icterus.  Neck: No thyromegaly present.  Cardiovascular: Normal rate, regular rhythm and normal heart sounds.  Pulmonary/Chest: Effort normal and breath sounds normal.  Abdominal: Soft.  Neurological: He is alert and oriented to person, place, and time.  Skin: Skin is warm and dry.  Psychiatric: He has  a normal mood and affect. His behavior is normal. Judgment and thought content normal.        Assessment & Plan:     1. Essential hypertension   2. Aortic atherosclerosis ([place]) Discussed Rx risk factors. Found on screening CT. - rosuvastatin (CRESTOR) 10 MG tablet; Take 1 tablet (10 mg total) by mouth daily.  Dispense: 90 tablet; Refill: 3  3. Mild emphysema ([place])   4. Viral upper respiratory tract infection No Rx with resolution of symptoms   I have done the exam and reviewed the chart and it is accurate to the best of my knowledge. Development worker, community has been used and  any errors in dictation or transcription are unintentional. [name] M.D. [place], MD  [facility]

## 2017-12-04 ENCOUNTER — Ambulatory Visit (INDEPENDENT_AMBULATORY_CARE_PROVIDER_SITE_OTHER): Payer: Medicare Other | Admitting: Family Medicine

## 2017-12-04 VITALS — BP 122/64 | HR 64 | Temp 98.3°F | Resp 16

## 2017-12-04 DIAGNOSIS — E785 Hyperlipidemia, unspecified: Secondary | ICD-10-CM | POA: Diagnosis not present

## 2017-12-04 DIAGNOSIS — R5383 Other fatigue: Secondary | ICD-10-CM | POA: Diagnosis not present

## 2017-12-04 DIAGNOSIS — I10 Essential (primary) hypertension: Secondary | ICD-10-CM | POA: Diagnosis not present

## 2017-12-04 DIAGNOSIS — R61 Generalized hyperhidrosis: Secondary | ICD-10-CM | POA: Diagnosis not present

## 2017-12-04 DIAGNOSIS — I700 Atherosclerosis of aorta: Secondary | ICD-10-CM | POA: Diagnosis not present

## 2017-12-04 NOTE — Patient Instructions (Signed)
For your blood pressure cuff, best ones are [employer] or [employer].

## 2017-12-04 NOTE — Progress Notes (Signed)
Patient: Dwayne Thompson Male    DOB: October 23, 1945   72 y.o.   MRN: [id number] Visit Date: 12/04/2017  Today's Provider: [name], MD   Chief Complaint  Patient presents with  . Follow-up   Subjective:    HPI   Pt is here today for a follow up of aortic arthrosclerosis. This was found on his screening CT. He was started on Crestor. He reports that he is doing well on it. No side effects. Denies chest pain, shortness of breath.   Allergies  Allergen Reactions  . Tamsulosin Hcl Hives     Current Outpatient Medications:  .  aspirin 81 MG tablet, Take by mouth., Disp: , Rfl:  .  Cholecalciferol 1000 UNITS tablet, Take by mouth. Reported on 03/03/2016, Disp: , Rfl:  .  doxazosin (CARDURA) 4 MG tablet, TAKE 1 TABLET DAILY, Disp: 90 tablet, Rfl: 3 .  IBU 800 MG tablet, TAKE 1 TABLET THREE TIMES A DAY AS NEEDED, Disp: 270 tablet, Rfl: 2 .  lisinopril (PRINIVIL,ZESTRIL) 10 MG tablet, Take 1 tablet (10 mg total) by mouth daily., Disp: 90 tablet, Rfl: 3 .  rosuvastatin (CRESTOR) 10 MG tablet, Take 1 tablet (10 mg total) by mouth daily., Disp: 90 tablet, Rfl: 3  Review of Systems  Constitutional: Positive for diaphoresis and fatigue.       Occasional sweats over past year with no other symptoms.  HENT: Negative.   Eyes: Negative.   Respiratory: Negative.   Cardiovascular: Negative.   Gastrointestinal: Negative.   Endocrine: Negative.   Genitourinary: Negative.   Musculoskeletal: Negative.   Skin: Negative.   Allergic/Immunologic: Negative.   Neurological: Negative.   Hematological: Negative.   Psychiatric/Behavioral: Negative.     Social History   Tobacco Use  . Smoking status: Former Smoker    Packs/day: 1.00    Years: 50.00    Pack years: 50.00    Types: Cigarettes    Last attempt to quit: 10/20/2013    Years since quitting: 4.1  . Smokeless tobacco: Never Used  Substance Use Topics  . Alcohol use: Yes    Comment: daily, 3 to 4 drinks daily, usually gin  and chases it down with soda   Objective:   BP 122/64 (BP Location: Right Arm, Patient Position: Sitting, Cuff Size: Normal)   Pulse 64   Temp 98.3 F (36.8 C) (Oral)   Resp 16   SpO2 98%  Vitals:   12/04/17 1102  BP: 122/64  Pulse: 64  Resp: 16  Temp: 98.3 F (36.8 C)  TempSrc: Oral  SpO2: 98%     Physical Exam  Constitutional: He is oriented to person, place, and time. He appears well-developed and well-nourished.  HENT:  Head: Normocephalic and atraumatic.  Right Ear: External ear normal.  Left Ear: External ear normal.  Nose: Nose normal.  Mouth/Throat: Oropharynx is clear and moist.  Eyes: Conjunctivae are normal. No scleral icterus.  Neck: No thyromegaly present.  Cardiovascular: Normal rate, regular rhythm and normal heart sounds.  Pulmonary/Chest: Effort normal.  Abdominal: Soft.  Neurological: He is alert and oriented to person, place, and time.  Skin: Skin is warm and dry.  Psychiatric: He has a normal mood and affect. His behavior is normal. Judgment and thought content normal.        Assessment & Plan:     1. Hyperlipidemia, unspecified hyperlipidemia type RTC 2onths. - Lipid panel - Comprehensive metabolic panel  2. Other fatigue  - CBC  with Differential/Platelet - TSH  3. Night sweats  - QuantiFERON-TB Gold Plus  4. Essential hypertension   5. Aortic atherosclerosis [facility]) Consider cardiology referral if he has other symptoms.      I have done the exam and reviewed the above chart and it is accurate to the best of my knowledge. Development worker, community has been used in this note in any air is in the dictation or transcription are unintentional.  [name], MD  [place]

## 2017-12-07 ENCOUNTER — Telehealth: Payer: Self-pay | Admitting: Emergency Medicine

## 2017-12-07 NOTE — Telephone Encounter (Signed)
-----   Message from Jerrol Banana., MD sent at 12/07/2017  1:32 PM EDT ----- K low--start KCL 55meq --2 daily--repeat K next OV.

## 2017-12-07 NOTE — Telephone Encounter (Signed)
LMTCB

## 2017-12-08 MED ORDER — POTASSIUM CHLORIDE ER 10 MEQ PO TBCR: 10.0000 meq | EXTENDED_RELEASE_TABLET | Freq: Two times a day (BID) | ORAL | 3 refills | Status: DC

## 2017-12-08 NOTE — Telephone Encounter (Signed)
Advised patient as below. Medication was sent into the pharmacy.  

## 2017-12-09 LAB — COMPREHENSIVE METABOLIC PANEL
ALT: 29 IU/L (ref 0–44)
AST: 47 IU/L — ABNORMAL HIGH (ref 0–40)
Albumin/Globulin Ratio: 1.5 (ref 1.2–2.2)
Albumin: 4.3 g/dL (ref 3.5–4.8)
Alkaline Phosphatase: 52 IU/L (ref 39–117)
BUN/Creatinine Ratio: 12 (ref 10–24)
BUN: 10 mg/dL (ref 8–27)
Bilirubin Total: 0.6 mg/dL (ref 0.0–1.2)
CO2: 24 mmol/L (ref 20–29)
Calcium: 9.4 mg/dL (ref 8.6–10.2)
Chloride: 102 mmol/L (ref 96–106)
Creatinine, Ser: 0.86 mg/dL (ref 0.76–1.27)
GFR calc Af Amer: 101 mL/min/{1.73_m2} (ref >59)
GFR calc non Af Amer: 87 mL/min/{1.73_m2} (ref >59)
Globulin, Total: 2.8 g/dL (ref 1.5–4.5)
Glucose: 97 mg/dL (ref 65–99)
Potassium: 3 mmol/L — ABNORMAL LOW (ref 3.5–5.2)
Sodium: 143 mmol/L (ref 134–144)
Total Protein: 7.1 g/dL (ref 6.0–8.5)

## 2017-12-09 LAB — CBC WITH DIFFERENTIAL/PLATELET
Basophils Absolute: 0 10*3/uL (ref 0.0–0.2)
Basos: 0 %
EOS (ABSOLUTE): 0.1 10*3/uL (ref 0.0–0.4)
Eos: 2 %
Hematocrit: 40.9 % (ref 37.5–51.0)
Hemoglobin: 14.9 g/dL (ref 13.0–17.7)
Immature Grans (Abs): 0 10*3/uL (ref 0.0–0.1)
Immature Granulocytes: 0 %
Lymphocytes Absolute: 1.5 10*3/uL (ref 0.7–3.1)
Lymphs: 25 %
MCH: 33.6 pg — ABNORMAL HIGH (ref 26.6–33.0)
MCHC: 36.4 g/dL — ABNORMAL HIGH (ref 31.5–35.7)
MCV: 92 fL (ref 79–97)
Monocytes Absolute: 0.4 10*3/uL (ref 0.1–0.9)
Monocytes: 6 %
Neutrophils Absolute: 4.2 10*3/uL (ref 1.4–7.0)
Neutrophils: 67 %
Platelets: 101 10*3/uL — ABNORMAL LOW (ref 150–379)
RBC: 4.43 x10E6/uL (ref 4.14–5.80)
RDW: 13.5 % (ref 12.3–15.4)
WBC: 6.2 10*3/uL (ref 3.4–10.8)

## 2017-12-09 LAB — LIPID PANEL
Chol/HDL Ratio: 2 ratio (ref 0.0–5.0)
Cholesterol, Total: 111 mg/dL (ref 100–199)
HDL: 55 mg/dL (ref >39)
LDL Calculated: 47 mg/dL (ref 0–99)
Triglycerides: 43 mg/dL (ref 0–149)
VLDL Cholesterol Cal: 9 mg/dL (ref 5–40)

## 2017-12-09 LAB — QUANTIFERON-TB GOLD PLUS
QuantiFERON Mitogen Value: 3.13 IU/mL
QuantiFERON Nil Value: 0.01 IU/mL
QuantiFERON TB1 Ag Value: 0.01 IU/mL
QuantiFERON TB2 Ag Value: 0.01 IU/mL
QuantiFERON-TB Gold Plus: NEGATIVE

## 2017-12-09 LAB — TSH: TSH: 0.632 u[IU]/mL (ref 0.450–4.500)

## 2017-12-11 ENCOUNTER — Telehealth: Payer: Self-pay | Admitting: Emergency Medicine

## 2017-12-11 NOTE — Telephone Encounter (Signed)
Pt had already been advised.

## 2017-12-11 NOTE — Telephone Encounter (Signed)
LMTCB

## 2017-12-11 NOTE — Telephone Encounter (Signed)
-----   Message from Jerrol Banana., MD sent at 12/11/2017  8:36 AM EDT ----- Labs of--K low--start Kcl 26meq daily.

## 2018-01-01 ENCOUNTER — Inpatient Hospital Stay
Admission: EM | Admit: 2018-01-01 | Discharge: 2018-01-04 | DRG: 378 | Disposition: A | Discharge status: Home / Self Care | Payer: Medicare Other | Attending: Family Medicine | Admitting: Family Medicine

## 2018-01-01 ENCOUNTER — Emergency Department: Payer: Medicare Other

## 2018-01-01 ENCOUNTER — Other Ambulatory Visit: Payer: Self-pay

## 2018-01-01 DIAGNOSIS — B9681 Helicobacter pylori [H. pylori] as the cause of diseases classified elsewhere: Secondary | ICD-10-CM | POA: Diagnosis present

## 2018-01-01 DIAGNOSIS — D649 Anemia, unspecified: Secondary | ICD-10-CM | POA: Diagnosis present

## 2018-01-01 DIAGNOSIS — Z888 Allergy status to other drugs, medicaments and biological substances status: Secondary | ICD-10-CM

## 2018-01-01 DIAGNOSIS — K08109 Complete loss of teeth, unspecified cause, unspecified class: Secondary | ICD-10-CM | POA: Diagnosis present

## 2018-01-01 DIAGNOSIS — M545 Low back pain: Secondary | ICD-10-CM | POA: Diagnosis present

## 2018-01-01 DIAGNOSIS — Z87891 Personal history of nicotine dependence: Secondary | ICD-10-CM | POA: Diagnosis not present

## 2018-01-01 DIAGNOSIS — I10 Essential (primary) hypertension: Secondary | ICD-10-CM | POA: Diagnosis present

## 2018-01-01 DIAGNOSIS — Z79899 Other long term (current) drug therapy: Secondary | ICD-10-CM

## 2018-01-01 DIAGNOSIS — E538 Deficiency of other specified B group vitamins: Secondary | ICD-10-CM | POA: Diagnosis present

## 2018-01-01 DIAGNOSIS — K922 Gastrointestinal hemorrhage, unspecified: Secondary | ICD-10-CM

## 2018-01-01 DIAGNOSIS — G8929 Other chronic pain: Secondary | ICD-10-CM | POA: Diagnosis present

## 2018-01-01 DIAGNOSIS — M47896 Other spondylosis, lumbar region: Secondary | ICD-10-CM | POA: Diagnosis present

## 2018-01-01 DIAGNOSIS — Z8711 Personal history of peptic ulcer disease: Secondary | ICD-10-CM | POA: Diagnosis not present

## 2018-01-01 DIAGNOSIS — Z8249 Family history of ischemic heart disease and other diseases of the circulatory system: Secondary | ICD-10-CM

## 2018-01-01 DIAGNOSIS — D696 Thrombocytopenia, unspecified: Secondary | ICD-10-CM | POA: Diagnosis present

## 2018-01-01 DIAGNOSIS — D62 Acute posthemorrhagic anemia: Secondary | ICD-10-CM | POA: Diagnosis present

## 2018-01-01 DIAGNOSIS — Z791 Long term (current) use of non-steroidal anti-inflammatories (NSAID): Secondary | ICD-10-CM | POA: Diagnosis not present

## 2018-01-01 DIAGNOSIS — E876 Hypokalemia: Secondary | ICD-10-CM | POA: Diagnosis not present

## 2018-01-01 DIAGNOSIS — K921 Melena: Principal | ICD-10-CM | POA: Diagnosis present

## 2018-01-01 DIAGNOSIS — K648 Other hemorrhoids: Secondary | ICD-10-CM | POA: Diagnosis present

## 2018-01-01 DIAGNOSIS — K2950 Unspecified chronic gastritis without bleeding: Secondary | ICD-10-CM | POA: Diagnosis present

## 2018-01-01 DIAGNOSIS — M549 Dorsalgia, unspecified: Secondary | ICD-10-CM | POA: Diagnosis not present

## 2018-01-01 DIAGNOSIS — Z882 Allergy status to sulfonamides status: Secondary | ICD-10-CM | POA: Diagnosis not present

## 2018-01-01 DIAGNOSIS — Z7982 Long term (current) use of aspirin: Secondary | ICD-10-CM | POA: Diagnosis not present

## 2018-01-01 DIAGNOSIS — K3189 Other diseases of stomach and duodenum: Secondary | ICD-10-CM | POA: Diagnosis present

## 2018-01-01 DIAGNOSIS — R42 Dizziness and giddiness: Secondary | ICD-10-CM | POA: Diagnosis not present

## 2018-01-01 DIAGNOSIS — K2980 Duodenitis without bleeding: Secondary | ICD-10-CM | POA: Diagnosis present

## 2018-01-01 DIAGNOSIS — R0602 Shortness of breath: Secondary | ICD-10-CM | POA: Diagnosis not present

## 2018-01-01 DIAGNOSIS — D500 Iron deficiency anemia secondary to blood loss (chronic): Secondary | ICD-10-CM | POA: Diagnosis not present

## 2018-01-01 DIAGNOSIS — F1010 Alcohol abuse, uncomplicated: Secondary | ICD-10-CM | POA: Diagnosis not present

## 2018-01-01 DIAGNOSIS — Z7902 Long term (current) use of antithrombotics/antiplatelets: Secondary | ICD-10-CM

## 2018-01-01 LAB — TYPE AND SCREEN
ABO/RH(D): A POS
Antibody Screen: NEGATIVE

## 2018-01-01 LAB — COMPREHENSIVE METABOLIC PANEL
ALT: 17 U/L (ref 17–63)
AST: 26 U/L (ref 15–41)
Albumin: 4.1 g/dL (ref 3.5–5.0)
Alkaline Phosphatase: 41 U/L (ref 38–126)
Anion gap: 8 (ref 5–15)
BUN: 52 mg/dL — ABNORMAL HIGH (ref 6–20)
CO2: 25 mmol/L (ref 22–32)
Calcium: 9.1 mg/dL (ref 8.9–10.3)
Chloride: 107 mmol/L (ref 101–111)
Creatinine, Ser: 1.06 mg/dL (ref 0.61–1.24)
GFR calc Af Amer: >60 mL/min (ref >60)
GFR calc non Af Amer: >60 mL/min (ref >60)
Glucose, Bld: 119 mg/dL — ABNORMAL HIGH (ref 65–99)
Potassium: 3.2 mmol/L — ABNORMAL LOW (ref 3.5–5.1)
Sodium: 140 mmol/L (ref 135–145)
Total Bilirubin: 0.7 mg/dL (ref 0.3–1.2)
Total Protein: 7.1 g/dL (ref 6.5–8.1)

## 2018-01-01 LAB — TROPONIN I: Troponin I: <0.03 ng/mL (ref <0.03)

## 2018-01-01 LAB — CBC
HCT: 34 % — ABNORMAL LOW (ref 40.0–52.0)
Hemoglobin: 12 g/dL — ABNORMAL LOW (ref 13.0–18.0)
MCH: 33.2 pg (ref 26.0–34.0)
MCHC: 35.1 g/dL (ref 32.0–36.0)
MCV: 94.5 fL (ref 80.0–100.0)
Platelets: 118 10*3/uL — ABNORMAL LOW (ref 150–440)
RBC: 3.6 MIL/uL — ABNORMAL LOW (ref 4.40–5.90)
RDW: 14.3 % (ref 11.5–14.5)
WBC: 8.5 10*3/uL (ref 3.8–10.6)

## 2018-01-01 LAB — HEMOGLOBIN
Hemoglobin: 10.7 g/dL — ABNORMAL LOW (ref 13.0–18.0)
Hemoglobin: 9.5 g/dL — ABNORMAL LOW (ref 13.0–18.0)

## 2018-01-01 MED ORDER — SODIUM CHLORIDE 0.9 % IV SOLN
8.0000 mg/h | INTRAVENOUS | Status: DC
  Administered 2018-01-01 – 2018-01-02 (×2): 8 mg/h via INTRAVENOUS
  Filled 2018-01-01 (×3): qty 80

## 2018-01-01 MED ORDER — ONDANSETRON HCL 4 MG PO TABS: 4.0000 mg | ORAL_TABLET | Freq: Four times a day (QID) | ORAL | Status: DC | PRN

## 2018-01-01 MED ORDER — SODIUM CHLORIDE 0.9 % IV BOLUS
1000.0000 mL | Freq: Once | INTRAVENOUS | Status: AC
  Administered 2018-01-01: 1000 mL via INTRAVENOUS

## 2018-01-01 MED ORDER — SODIUM CHLORIDE 0.9 % IV SOLN
80.0000 mg | Freq: Once | INTRAVENOUS | Status: AC
  Administered 2018-01-01: 80 mg via INTRAVENOUS
  Filled 2018-01-01: qty 80

## 2018-01-01 MED ORDER — ALBUTEROL SULFATE (2.5 MG/3ML) 0.083% IN NEBU: 2.5000 mg | INHALATION_SOLUTION | RESPIRATORY_TRACT | Status: DC | PRN

## 2018-01-01 MED ORDER — ACETAMINOPHEN 325 MG PO TABS: 650.0000 mg | ORAL_TABLET | Freq: Four times a day (QID) | ORAL | Status: DC | PRN

## 2018-01-01 MED ORDER — ACETAMINOPHEN 650 MG RE SUPP: 650.0000 mg | Freq: Four times a day (QID) | RECTAL | Status: DC | PRN

## 2018-01-01 MED ORDER — ROSUVASTATIN CALCIUM 10 MG PO TABS
10.0000 mg | ORAL_TABLET | Freq: Every day | ORAL | Status: DC
  Administered 2018-01-02 – 2018-01-04 (×3): 10 mg via ORAL
  Filled 2018-01-01 (×3): qty 1

## 2018-01-01 MED ORDER — POLYETHYLENE GLYCOL 3350 17 G PO PACK: 17.0000 g | PACK | Freq: Every day | ORAL | Status: DC | PRN

## 2018-01-01 MED ORDER — PANTOPRAZOLE SODIUM 40 MG IV SOLR: 40.0000 mg | Freq: Two times a day (BID) | INTRAVENOUS | Status: DC

## 2018-01-01 MED ORDER — DOXAZOSIN MESYLATE 4 MG PO TABS
4.0000 mg | ORAL_TABLET | Freq: Every day | ORAL | Status: DC
  Administered 2018-01-02 – 2018-01-04 (×2): 4 mg via ORAL
  Filled 2018-01-01 (×3): qty 1

## 2018-01-01 MED ORDER — TRAMADOL HCL 50 MG PO TABS: 50.0000 mg | ORAL_TABLET | Freq: Four times a day (QID) | ORAL | Status: DC | PRN

## 2018-01-01 MED ORDER — POTASSIUM CHLORIDE CRYS ER 10 MEQ PO TBCR
10.0000 meq | EXTENDED_RELEASE_TABLET | Freq: Two times a day (BID) | ORAL | Status: DC
  Administered 2018-01-01 – 2018-01-03 (×5): 10 meq via ORAL
  Filled 2018-01-01 (×8): qty 1

## 2018-01-01 MED ORDER — ONDANSETRON HCL 4 MG/2ML IJ SOLN: 4.0000 mg | Freq: Four times a day (QID) | INTRAMUSCULAR | Status: DC | PRN

## 2018-01-01 MED ORDER — POTASSIUM CHLORIDE IN NACL 20-0.9 MEQ/L-% IV SOLN
INTRAVENOUS | Status: DC
  Administered 2018-01-01 – 2018-01-03 (×5): via INTRAVENOUS
  Filled 2018-01-01 (×9): qty 1000

## 2018-01-01 NOTE — ED Notes (Signed)
Pt sitting on side of bed for urination. Denies any dizziness.

## 2018-01-01 NOTE — H&P (Signed)
[facility] [place] NAME: Dwayne Thompson    MR#:  [id number]  DATE OF BIRTH:  1946/01/04  DATE OF ADMISSION:  01/01/2018  PRIMARY CARE PHYSICIAN: Jerrol Banana., MD   REQUESTING/REFERRING PHYSICIAN: Dr. Clearnce Hasten  CHIEF COMPLAINT:   Chief Complaint  Patient presents with  . Shortness of Breath  . Dizziness    HISTORY OF PRESENT ILLNESS:  Dwayne Thompson  is a 72 y.o. male with a known history of peptic ulcer disease diagnosed 30 years back, hypertension who takes baby aspirin and daily ibuprofen for back pain presents to the ER complaining of shortness of breath and dizziness for 3 days.  He has significant shortness of breath on ambulating and gets dizzy.  Had one episode of presyncope.  Due to this patient presented to the ER.  Here patient complained of one episode of melena.  No bright red blood.  Stool positive for blood.  Hemoglobin 12.5.  Baseline hemoglobin last month was 15. Patient is being admitted for upper GI bleed, melena.  No chest pain. Patient has not had anything to eat or drink since 2 AM.  PAST MEDICAL HISTORY:   Past Medical History:  Diagnosis Date  . Back pain   . BPH (benign prostatic hyperplasia)   . ED (erectile dysfunction)     PAST SURGICAL HISTORY:   Past Surgical History:  Procedure Laterality Date  . HERNIA REPAIR     1980's    SOCIAL HISTORY:   Social History   Tobacco Use  . Smoking status: Former Smoker    Packs/day: 1.00    Years: 50.00    Pack years: 50.00    Types: Cigarettes    Last attempt to quit: 10/20/2013    Years since quitting: 4.2  . Smokeless tobacco: Never Used  Substance Use Topics  . Alcohol use: Yes    Comment: daily, 3 to 4 drinks daily, usually gin and chases it down with soda    FAMILY HISTORY:   Family History  Problem Relation Age of Onset  . Alzheimer's disease Mother   . Sarcoidosis Sister   . Diabetes Brother   . Hypertension Brother   . Anxiety disorder  Brother   . Kidney disease Neg Hx   . Prostate cancer Neg Hx     DRUG ALLERGIES:   Allergies  Allergen Reactions  . Tamsulosin Hcl Hives    REVIEW OF SYSTEMS:   Review of Systems  Constitutional: Positive for malaise/fatigue. Negative for chills and fever.  HENT: Negative for sore throat.   Eyes: Negative for blurred vision, double vision and pain.  Respiratory: Negative for cough, hemoptysis, shortness of breath and wheezing.   Cardiovascular: Negative for chest pain, palpitations, orthopnea and leg swelling.  Gastrointestinal: Positive for melena. Negative for abdominal pain, constipation, diarrhea, heartburn, nausea and vomiting.  Genitourinary: Negative for dysuria and hematuria.  Musculoskeletal: Negative for back pain and joint pain.  Skin: Negative for rash.  Neurological: Positive for dizziness. Negative for sensory change, speech change, focal weakness and headaches.  Endo/Heme/Allergies: Does not bruise/bleed easily.  Psychiatric/Behavioral: Negative for depression. The patient is not nervous/anxious.     MEDICATIONS AT HOME:   Prior to Admission medications   Medication Sig Start Date End Date Taking? Authorizing Provider  aspirin 81 MG tablet Take 81 mg by mouth daily.    Yes [provider]  Cholecalciferol 1000 UNITS tablet Take 1,000 Units by mouth daily. Reported on 03/03/2016  Yes [provider]  doxazosin (CARDURA) 4 MG tablet TAKE 1 TABLET DAILY 04/14/17  Yes Jerrol Banana., MD  lisinopril (PRINIVIL,ZESTRIL) 10 MG tablet Take 1 tablet (10 mg total) by mouth daily. 10/03/17  Yes Jerrol Banana., MD  potassium chloride (K-DUR) 10 MEQ tablet Take 1 tablet (10 mEq total) by mouth 2 (two) times daily. 12/08/17  Yes Jerrol Banana., MD  rosuvastatin (CRESTOR) 10 MG tablet Take 1 tablet (10 mg total) by mouth daily. 11/06/17  Yes Jerrol Banana., MD  IBU 800 MG tablet TAKE 1 TABLET THREE TIMES A DAY AS NEEDED 02/27/17    Jerrol Banana., MD     VITAL SIGNS:  Blood pressure 98/70, pulse 99, resp. rate (!) 25, height 5\' 11"  (1.803 m), weight 85.6 kg (188 lb 11.4 oz), SpO2 100 %.  PHYSICAL EXAMINATION:  Physical Exam  GENERAL:  72 y.o.-year-old patient lying in the bed with no acute distress.  EYES: Pupils equal, round, reactive to light and accommodation. No scleral icterus. Extraocular muscles intact.  HEENT: Head atraumatic, normocephalic. Oropharynx and nasopharynx clear. No oropharyngeal erythema, moist oral mucosa  NECK:  Supple, no jugular venous distention. No thyroid enlargement, no tenderness.  LUNGS: Normal breath sounds bilaterally, no wheezing, rales, rhonchi. No use of accessory muscles of respiration.  CARDIOVASCULAR: S1, S2 normal. No murmurs, rubs, or gallops.  ABDOMEN: Soft, nontender, nondistended. Bowel sounds present. No organomegaly or mass.  EXTREMITIES: No pedal edema, cyanosis, or clubbing. + 2 pedal & radial pulses b/l.   NEUROLOGIC: Cranial nerves II through XII are intact. No focal Motor or sensory deficits appreciated b/l PSYCHIATRIC: The patient is alert and oriented x 3. Good affect.  SKIN: No obvious rash, lesion, or ulcer.   LABORATORY PANEL:   CBC Recent Labs  Lab 01/01/18 1041  WBC 8.5  HGB 12.0*  HCT 34.0*  PLT 118*   ------------------------------------------------------------------------------------------------------------------  Chemistries  Recent Labs  Lab 01/01/18 1041  NA 140  K 3.2*  CL 107  CO2 25  GLUCOSE 119*  BUN 52*  CREATININE 1.06  CALCIUM 9.1  AST 26  ALT 17  ALKPHOS 41  BILITOT 0.7   ------------------------------------------------------------------------------------------------------------------  Cardiac Enzymes Recent Labs  Lab 01/01/18 1041  TROPONINI <0.03   ------------------------------------------------------------------------------------------------------------------  RADIOLOGY:  Dg Chest 1 View  Result  Date: 01/01/2018 CLINICAL DATA:  Shortness of breath, dizziness, and diaphoresis. Former smoker. EXAM: CHEST  1 VIEW COMPARISON:  Chest CT 09/01/2017 and radiographs 05/15/2017 FINDINGS: The cardiomediastinal silhouette is within normal limits. There is evidence of underlying emphysema. The patient has taken a slightly shallower inspiration than on the prior radiographs, and there is minimal heterogeneous opacity in both lung bases. No pleural effusion or pneumothorax is identified. No acute osseous abnormality is seen. IMPRESSION: Minimal bibasilar opacity which may reflect atelectasis or possibly early infection. Electronically Signed   By: Logan Bores M.D.   On: 01/01/2018 11:22     IMPRESSION AND PLAN:   *Upper GI bleed with melena Patient's hemoglobin is at 12 but baseline seems to be 14.5-15.  We will bolus 1 L normal saline stat.  Repeat hemoglobin every 6 hours.  Start Protonix drip.  Hold aspirin and ibuprofen. Consulted and discussed with GI Dr. Bonna Gains. Trend HB and EGD in AM.  *Hypertension.  Hold lisinopril.  Blood pressure low normal.  *Chronic low back pain.  Hold ibuprofen.  Add Tylenol and tramadol.  *DVT prophylaxis with SCDs   All  the records are reviewed and case discussed with ED provider. Management plans discussed with the patient, family and they are in agreement.  CODE STATUS: FULL CODE  TOTAL TIME TAKING CARE OF THIS PATIENT: 40 minutes.   Neita Carp M.D on 01/01/2018 at 12:06 PM  Between 7am to 6pm - Pager - [phone]  After 6pm go to [email or web address] - password [email or web address] [facility]  Office  [phone]  CC: Primary care physician; Jerrol Banana., MD  Note: This dictation was prepared with Dragon dictation along with smaller phrase technology. Any transcriptional errors that result from this process are unintentional.

## 2018-01-01 NOTE — ED Provider Notes (Signed)
[facility] Emergency Department Provider Note  ____________________________________________   First MD Initiated Contact with Patient 01/01/18 1024     (approximate)  I have reviewed the triage vital signs and the nursing notes.   HISTORY  Chief Complaint Shortness of Breath and Dizziness   HPI Dwayne Thompson is a 72 y.o. male with a history of BPH on a baby aspirin was to the emergency department with black stool one day and a half ago as well as shortness of breath with exertion.  He says when lying down he feels normal.  Has not had any rectal bleeding or black stool since 2 days ago.  Denying any chest pain.  Does not report cough or fever.  Past Medical History:  Diagnosis Date  . Back pain   . BPH (benign prostatic hyperplasia)   . ED (erectile dysfunction)     Patient Active Problem List   Diagnosis Date Noted  . Neck pain on right side 03/23/2016  . History of tobacco abuse 03/23/2016  . Cervical lymphadenitis 03/23/2016  . 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 Allergic rhinitis 03/28/2015  . 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 Failure of erection 03/28/2015  . Acid reflux 03/28/2015  . Arthritis, degenerative 03/28/2015  . Change in blood platelet count 03/28/2015  . Temporary cerebral vascular dysfunction 03/28/2015  . Personal history of tobacco use, presenting hazards to health 03/28/2015    Past Surgical History:  Procedure Laterality Date  . HERNIA REPAIR     1980's    Prior to Admission medications   Medication Sig Start Date End Date Taking? Authorizing Provider  aspirin 81 MG tablet Take 81 mg by mouth daily.     [provider]  Cholecalciferol 1000 UNITS tablet Take 1,000 Units by mouth daily. Reported on 03/03/2016    [provider]  doxazosin (CARDURA) 4 MG tablet  TAKE 1 TABLET DAILY 04/14/17   Jerrol Banana., MD  IBU 800 MG tablet TAKE 1 TABLET THREE TIMES A DAY AS NEEDED 02/27/17   Jerrol Banana., MD  lisinopril (PRINIVIL,ZESTRIL) 10 MG tablet Take 1 tablet (10 mg total) by mouth daily. 10/03/17   Jerrol Banana., MD  potassium chloride (K-DUR) 10 MEQ tablet Take 1 tablet (10 mEq total) by mouth 2 (two) times daily. 12/08/17   Jerrol Banana., MD  rosuvastatin (CRESTOR) 10 MG tablet Take 1 tablet (10 mg total) by mouth daily. 11/06/17   Jerrol Banana., MD    Allergies Tamsulosin hcl  Family History  Problem Relation Age of Onset  . Alzheimer's disease Mother   . Sarcoidosis Sister   . Diabetes Brother   . Hypertension Brother   . Anxiety disorder Brother   . Kidney disease Neg Hx   . Prostate cancer Neg Hx     Social History Social History   Tobacco Use  . Smoking status: Former Smoker    Packs/day: 1.00    Years: 50.00    Pack years: 50.00    Types: Cigarettes    Last attempt to quit: 10/20/2013    Years since quitting: 4.2  . Smokeless tobacco: Never Used  Substance Use Topics  . Alcohol use: Yes    Comment: daily, 3 to 4 drinks daily, usually gin and chases it down with soda  . Drug use: No  Review of Systems  Constitutional: No fever/chills Eyes: No visual changes. ENT: No sore throat. Cardiovascular: Denies chest pain. Respiratory: As above  gastrointestinal: No abdominal pain.  No nausea, no vomiting.  No diarrhea.  No constipation. Genitourinary: Negative for dysuria. Musculoskeletal: Negative for back pain. Skin: Negative for rash. Neurological: Negative for headaches, focal weakness or numbness.   ____________________________________________   PHYSICAL EXAM:  VITAL SIGNS: ED Triage Vitals  Enc Vitals Group     BP 01/01/18 1029 98/70     Pulse Rate 01/01/18 1029 99     Resp 01/01/18 1029 (!) 25     Temp --      Temp src --      SpO2 01/01/18 1029 100 %     Weight  01/01/18 1031 188 lb 11.4 oz (85.6 kg)     Height 01/01/18 1031 5\' 11"  (1.803 m)     Head Circumference --      Peak Flow --      Pain Score 01/01/18 1031 0     Pain Loc --      Pain Edu? --      Excl. in [place]? --     Constitutional: Alert and oriented. Well appearing and in no acute distress. Eyes: Conjunctivae are normal.  Head: Atraumatic. Nose: No congestion/rhinnorhea. Mouth/Throat: Mucous membranes are moist.  Neck: No stridor.   Cardiovascular: Normal rate, regular rhythm. Grossly normal heart sounds.  Respiratory: Normal respiratory effort.  No retractions. Lungs CTAB. Gastrointestinal: Soft and nontender. No distention.  Rectal exam with black stool which is strongly heme positive. Musculoskeletal: No lower extremity tenderness nor edema.  No joint effusions. Neurologic:  Normal speech and language. No gross focal neurologic deficits are appreciated. Skin:  Skin is warm, dry and intact. No rash noted. Psychiatric: Mood and affect are normal. Speech and behavior are normal.  ____________________________________________   LABS (all labs ordered are listed, but only abnormal results are displayed)  Labs Reviewed  COMPREHENSIVE METABOLIC PANEL - Abnormal; Notable for the following components:      Result Value   Potassium 3.2 (*)    Glucose, Bld 119 (*)    BUN 52 (*)    All other components within normal limits  CBC - Abnormal; Notable for the following components:   RBC 3.60 (*)    Hemoglobin 12.0 (*)    HCT 34.0 (*)    Platelets 118 (*)    All other components within normal limits  TROPONIN I  POC OCCULT BLOOD, ED  TYPE AND SCREEN   ____________________________________________  EKG  ED ECG REPORT I, Doran Stabler, the attending physician, personally viewed and interpreted this ECG.   Date: 01/01/2018  EKG Time: 1038  Rate: 94  Rhythm: normal sinus rhythm  Axis: normal  Intervals:none  ST&T Change: No ST segment elevation or depression.  No abnormal  T wave inversion.  ____________________________________________  RADIOLOGY  Atelectasis versus early infection, bilaterally to the lower fields ____________________________________________   PROCEDURES  Procedure(s) performed:   Procedures  Critical Care performed:   ____________________________________________   INITIAL IMPRESSION / ASSESSMENT AND PLAN / ED COURSE  Pertinent labs & imaging results that were available during my care of the patient were reviewed by me and considered in my medical decision making (see chart for details).  DDX: Anemia, GI bleeding, diverticular bleed, bleeding gastric ulcer, bleeding duodenal ulcer, symptomatic anemia, shortness of breath As part of my medical decision making, I reviewed the following data within the electronic medical  record: Reviewed outpatient visits.  ----------------------------------------- 11:59 AM on 01/01/2018 -----------------------------------------  Patient with 2 g hemoglobin drop.  Appears symptomatic.  Will be admitted to the hospital for GI bleeding.  Patient and family understanding of the plan and willing to comply.  ____________________________________________   FINAL CLINICAL IMPRESSION(S) / ED DIAGNOSES  GI bleeding.  Symptomatic anemia.    NEW MEDICATIONS STARTED DURING THIS VISIT:  New Prescriptions   No medications on file     Note:  This document was prepared using Dragon voice recognition software and may include unintentional dictation errors.     Orbie Pyo, MD 01/01/18 1159

## 2018-01-01 NOTE — ED Triage Notes (Signed)
Patient arrived via EMS with c/o SOB and dizziness with diaphoresis that started yesterday afternoon and continued this morning.

## 2018-01-01 NOTE — Consult Note (Signed)
Dwayne Antigua, MD [address] [place], [place], [place], [address] [address], [place], [place], [place], [address] Phone: [phone]  Fax: [phone]  Consultation  Referring Provider:     Dr. Darvin Neighbours Primary Care Physician:  Jerrol Banana., MD Reason for Consultation:     Melena  Date of Admission:  01/01/2018 Date of Consultation:  01/01/2018         HPI:   Dwayne Thompson is a 72 y.o. male who describes 2 episodes of melena at 2 AM on Sunday morning.  No further episodes since then.  No further bowel movement since then.  No abdominal pain.  No nausea or vomiting.  Patient is on ibuprofen 800 mg once daily.  Also takes aspirin daily.  No anticoagulants.  No previous upper endoscopy.  Reportedly had a colonoscopy about 10 years ago and polyps removed.  No hematochezia.  No family history of colon cancer.  No weight loss.  Past Medical History:  Diagnosis Date  . Back pain   . BPH (benign prostatic hyperplasia)   . ED (erectile dysfunction)     Past Surgical History:  Procedure Laterality Date  . HERNIA REPAIR     19 80's    Prior to Admission medications   Medication Sig Start Date End Date Taking? Authorizing Provider  aspirin 81 MG tablet Take 81 mg by mouth daily.    Yes [provider]  Cholecalciferol 1000 UNITS tablet Take 1,000 Units by mouth daily. Reported on 03/03/2016   Yes [provider]  doxazosin (CARDURA) 4 MG tablet TAKE 1 TABLET DAILY 04/14/17  Yes Jerrol Banana., MD  lisinopril (PRINIVIL,ZESTRIL) 10 MG tablet Take 1 tablet (10 mg total) by mouth daily. 10/03/17  Yes Jerrol Banana., MD  potassium chloride (K-DUR) 10 MEQ tablet Take 1 tablet (10 mEq total) by mouth 2 (two) times daily. 12/08/17  Yes Jerrol Banana., MD  rosuvastatin (CRESTOR) 10 MG tablet Take 1 tablet (10 mg total) by mouth daily. 11/06/17  Yes Jerrol Banana., MD  IBU 800 MG tablet TAKE 1 TABLET THREE TIMES A DAY AS NEEDED  02/27/17   Jerrol Banana., MD    Family History  Problem Relation Age of Onset  . Alzheimer's disease Mother   . Sarcoidosis Sister   . Diabetes Brother   . Hypertension Brother   . Anxiety disorder Brother   . Kidney disease Neg Hx   . Prostate cancer Neg Hx      Social History   Tobacco Use  . Smoking status: Former Smoker    Packs/day: 1.00    Years: 50.00    Pack years: 50.00    Types: Cigarettes    Last attempt to quit: 10/20/2013    Years since quitting: 4.2  . Smokeless tobacco: Never Used  Substance Use Topics  . Alcohol use: Yes    Comment: daily, 3 to 4 drinks daily, usually gin and chases it down with soda  . Drug use: No    Allergies as of 01/01/2018 - Review Complete 01/01/2018  Allergen Reaction Noted  . Tamsulosin hcl Hives 03/28/2015    Review of Systems:    All systems reviewed and negative except where noted in HPI.   Physical Exam:  Vital signs in last 24 hours: Vitals:   01/01/18 1300 01/01/18 1430 01/01/18 1530 01/01/18 1603  BP: 110/67 128/80 118/73 115/77  Pulse: 87 82 81 78  Resp: 17  15 18   Temp:    98.7 F (37.1 C)  TempSrc:    Oral  SpO2: 100% 99% 98% 100%  Weight:      Height:         General:   Pleasant, cooperative in NAD Head:  Normocephalic and atraumatic. Eyes:   No icterus.   Conjunctiva pink. PERRLA. Ears:  Normal auditory acuity. Neck:  Supple; no masses or thyroidomegaly Lungs: Respirations even and unlabored. Lungs clear to auscultation bilaterally.   No wheezes, crackles, or rhonchi.  Abdomen:  Soft, nondistended, nontender. Normal bowel sounds. No appreciable masses or hepatomegaly.  No rebound or guarding.  Neurologic:  Alert and oriented x3;  grossly normal neurologically. Skin:  Intact without significant lesions or rashes. Cervical Nodes:  No significant cervical adenopathy. Psych:  Alert and cooperative. Normal affect.  LAB RESULTS: Recent Labs    01/01/18 1041  WBC 8.5  HGB 12.0*  HCT 34.0*    PLT 118*   BMET Recent Labs    01/01/18 1041  NA 140  K 3.2*  CL 107  CO2 25  GLUCOSE 119*  BUN 52*  CREATININE 1.06  CALCIUM 9.1   LFT Recent Labs    01/01/18 1041  PROT 7.1  ALBUMIN 4.1  AST 26  ALT 17  ALKPHOS 41  BILITOT 0.7   PT/INR No results for input(s): LABPROT, INR in the last 72 hours.  STUDIES: Dg Chest 1 View  Result Date: 01/01/2018 CLINICAL DATA:  Shortness of breath, dizziness, and diaphoresis. Former smoker. EXAM: CHEST  1 VIEW COMPARISON:  Chest CT 09/01/2017 and radiographs 05/15/2017 FINDINGS: The cardiomediastinal silhouette is within normal limits. There is evidence of underlying emphysema. The patient has taken a slightly shallower inspiration than on the prior radiographs, and there is minimal heterogeneous opacity in both lung bases. No pleural effusion or pneumothorax is identified. No acute osseous abnormality is seen. IMPRESSION: Minimal bibasilar opacity which may reflect atelectasis or possibly early infection. Electronically Signed   By: Logan Bores M.D.   On: 01/01/2018 11:22      Impression / Plan:   Dwayne Thompson is a 72 y.o. y/o male with melena at home, that occurred yesterday morning, and patient has not had any bowel movement since then  With patient's NSAID use, peptic ulcer disease is a concern Patient is hemodynamically stable at this time, and hemoglobin is around 12 on admission at 10 AM this morning Repeat hemoglobin is pending  PPI IV twice daily Avoid NSAIDs Continue serial CBCs and transfuse PRN  If hemoglobin remains stable on repeat, can start clear liquid diet today EGD tomorrow morning  please correct Electrolyte abnormalities prior to EGD tomorrow.  Patient has hypokalemia, please recheck and correct as appropriate.  If Patient has signs of active GI bleeding, or hemodynamic instability, please page GI to evaluate for emergent EGD at that time  If EGD is negative, colonoscopy would be the next.  Thank you  for involving me in the care of this patient.      LOS: 0 days   Virgel Manifold, MD  01/01/2018, 4:43 PM

## 2018-01-02 ENCOUNTER — Encounter
Admission: EM | Disposition: A | Discharge status: Home / Self Care | Payer: Self-pay | Source: Home / Self Care | Attending: Family Medicine

## 2018-01-02 ENCOUNTER — Inpatient Hospital Stay: Payer: Medicare Other | Admitting: Anesthesiology

## 2018-01-02 ENCOUNTER — Encounter: Payer: Self-pay | Admitting: Certified Registered Nurse Anesthetist

## 2018-01-02 DIAGNOSIS — K3189 Other diseases of stomach and duodenum: Secondary | ICD-10-CM

## 2018-01-02 HISTORY — PX: ESOPHAGOGASTRODUODENOSCOPY: SHX5428

## 2018-01-02 LAB — BASIC METABOLIC PANEL
Anion gap: 4 — ABNORMAL LOW (ref 5–15)
BUN: 23 mg/dL — ABNORMAL HIGH (ref 6–20)
CO2: 22 mmol/L (ref 22–32)
Calcium: 8.2 mg/dL — ABNORMAL LOW (ref 8.9–10.3)
Chloride: 109 mmol/L (ref 101–111)
Creatinine, Ser: 0.81 mg/dL (ref 0.61–1.24)
GFR calc Af Amer: >60 mL/min (ref >60)
GFR calc non Af Amer: >60 mL/min (ref >60)
Glucose, Bld: 96 mg/dL (ref 65–99)
Potassium: 3.4 mmol/L — ABNORMAL LOW (ref 3.5–5.1)
Sodium: 135 mmol/L (ref 135–145)

## 2018-01-02 LAB — CBC
HCT: 25.5 % — ABNORMAL LOW (ref 40.0–52.0)
Hemoglobin: 9 g/dL — ABNORMAL LOW (ref 13.0–18.0)
MCH: 33.5 pg (ref 26.0–34.0)
MCHC: 35.3 g/dL (ref 32.0–36.0)
MCV: 94.8 fL (ref 80.0–100.0)
Platelets: 83 10*3/uL — ABNORMAL LOW (ref 150–440)
RBC: 2.68 MIL/uL — ABNORMAL LOW (ref 4.40–5.90)
RDW: 14.4 % (ref 11.5–14.5)
WBC: 6 10*3/uL (ref 3.8–10.6)

## 2018-01-02 LAB — HEMOGLOBIN AND HEMATOCRIT, BLOOD
HCT: 24 % — ABNORMAL LOW (ref 40.0–52.0)
Hemoglobin: 8.6 g/dL — ABNORMAL LOW (ref 13.0–18.0)

## 2018-01-02 SURGERY — EGD (ESOPHAGOGASTRODUODENOSCOPY)
Anesthesia: General

## 2018-01-02 MED ORDER — LIDOCAINE HCL (PF) 2 % IJ SOLN
INTRAMUSCULAR | Status: AC
  Filled 2018-01-02: qty 10

## 2018-01-02 MED ORDER — SODIUM CHLORIDE 0.9 % IV SOLN
INTRAVENOUS | Status: DC | PRN
  Administered 2018-01-02: 14:00:00 via INTRAVENOUS

## 2018-01-02 MED ORDER — LIDOCAINE HCL (CARDIAC) 20 MG/ML IV SOLN
INTRAVENOUS | Status: DC | PRN
  Administered 2018-01-02: 80 mg via INTRATRACHEAL

## 2018-01-02 MED ORDER — SODIUM CHLORIDE 0.9 % IV SOLN: INTRAVENOUS | Status: DC

## 2018-01-02 MED ORDER — GLYCOPYRROLATE 0.2 MG/ML IJ SOLN
INTRAMUSCULAR | Status: DC | PRN
  Administered 2018-01-02: 0.2 mg via INTRAVENOUS

## 2018-01-02 MED ORDER — BISACODYL 5 MG PO TBEC
10.0000 mg | DELAYED_RELEASE_TABLET | Freq: Once | ORAL | Status: AC
  Administered 2018-01-02: 10 mg via ORAL
  Filled 2018-01-02: qty 2

## 2018-01-02 MED ORDER — POLYETHYLENE GLYCOL 3350 17 GM/SCOOP PO POWD
1.0000 | Freq: Once | ORAL | Status: AC
  Administered 2018-01-02: 255 g via ORAL
  Filled 2018-01-02: qty 255

## 2018-01-02 MED ORDER — PANTOPRAZOLE SODIUM 40 MG PO TBEC
40.0000 mg | DELAYED_RELEASE_TABLET | Freq: Two times a day (BID) | ORAL | Status: DC
  Administered 2018-01-02 – 2018-01-03 (×2): 40 mg via ORAL
  Filled 2018-01-02 (×2): qty 1

## 2018-01-02 MED ORDER — SODIUM CHLORIDE 0.9 % IV SOLN
INTRAVENOUS | Status: DC
  Administered 2018-01-02: 14:00:00 via INTRAVENOUS

## 2018-01-02 MED ORDER — PROPOFOL 500 MG/50ML IV EMUL
INTRAVENOUS | Status: AC
  Filled 2018-01-02: qty 50

## 2018-01-02 MED ORDER — PROPOFOL 500 MG/50ML IV EMUL
INTRAVENOUS | Status: DC | PRN
  Administered 2018-01-02: 200 ug/kg/min via INTRAVENOUS

## 2018-01-02 MED ORDER — PROPOFOL 10 MG/ML IV BOLUS
INTRAVENOUS | Status: DC | PRN
  Administered 2018-01-02: 50 mg via INTRAVENOUS

## 2018-01-02 MED ORDER — GLYCOPYRROLATE 0.2 MG/ML IJ SOLN
INTRAMUSCULAR | Status: AC
  Filled 2018-01-02: qty 1

## 2018-01-02 NOTE — Op Note (Signed)
[facility] Gastroenterology Patient Name: Dwayne Thompson Procedure Date: 01/02/2018 1:53 PM MRN: [id number] Account #: [id number] Date of Birth: 1946-05-26 Admit Type: Inpatient Age: 72 Room: [facility] ENDO ROOM 3 Gender: Male Note Status: Finalized Procedure:            Upper GI endoscopy Indications:          Melena Providers:            Icy Fuhrmann B. Bonna Gains MD, MD Referring MD:         Janine Ores. Rosanna Randy, MD (Referring MD) Medicines:            Monitored Anesthesia Care Complications:        No immediate complications. Procedure:            Pre-Anesthesia Assessment:                       - Prior to the procedure, a History and Physical was                        performed, and patient medications, allergies and                        sensitivities were reviewed. The patient's tolerance of                        previous anesthesia was reviewed.                       - The risks and benefits of the procedure and the                        sedation options and risks were discussed with the                        patient. All questions were answered and informed                        consent was obtained.                       - Patient identification and proposed procedure were                        verified prior to the procedure by the physician, the                        nurse, the anesthesiologist, the anesthetist and the                        technician. The procedure was verified in the procedure                        room.                       - ASA Grade Assessment: II - A patient with mild                        systemic disease.                       After  obtaining informed consent, the endoscope was                        passed under direct vision. Throughout the procedure,                        the patient's blood pressure, pulse, and oxygen                        saturations were monitored continuously. The Endoscope                        was  introduced through the mouth, and advanced to the                        fourth part of duodenum. The upper GI endoscopy was                        accomplished with ease. The patient tolerated the                        procedure well. Findings:      The examined esophagus was normal.      Patchy mildly erythematous mucosa without bleeding was found in the       gastric antrum. Biopsies were taken with a cold forceps for histology.       Biopsies were obtained in the gastric body, at the incisura and in the       gastric antrum with cold forceps for histology.      Patchy moderately erythematous mucosa without active bleeding and with       no stigmata of bleeding was found in the duodenal bulb.      Localized nodular mucosa was found in the duodenal bulb. Biopsies were       taken with a cold forceps for histology. To prevent bleeding after the       biopsy, two hemostatic clips were successfully placed. There was no       bleeding at the end of the procedure.      The second portion of the duodenum, third portion of the duodenum,       fourth portion of the duodenum and examined duodenum were normal.      No ulcerations or actively bleeding lesions seen throughout the exam. No       old or new blood seen throughout the exam. Impression:           - Normal esophagus.                       - Erythematous mucosa in the antrum. Biopsied.                       - Erythematous duodenopathy.                       - Nodular mucosa in the duodenal bulb. Biopsied. Clips                        were placed.                       - Normal second portion of the duodenum, third portion  of the duodenum, fourth portion of the duodenum and                        examined duodenum.                       - No ulcerations or actively bleeding lesions seen                        throughout the exam. No old or new blood seen                        throughout the exam.                        - Biopsies were obtained in the gastric body, at the                        incisura and in the gastric antrum. Recommendation:       - The erythematous duodenum and stomach does not                        explain patient's melena. Colonoscopy is recommended                        and can be done tomorrow if patient is willing. Will                        discuss with patient and family and order colonoscopy                        prep today if agreeable.                       - Await pathology results.                       - Use Protonix (pantoprazole) 20 mg PO BID.                       - The findings and recommendations were discussed with                        the patient.                       - The findings and recommendations were discussed with                        the patient's family.                       - Continue Serial CBCs and transfuse PRN                       - Avoid NSAIDs except Aspirin if medically indicated Procedure Code(s):    --- Professional ---                       [phone], Esophagogastroduodenoscopy, flexible, transoral;  with biopsy, single or multiple Diagnosis Code(s):    --- Professional ---                       K31.89, Other diseases of stomach and duodenum                       K92.1, Melena (includes Hematochezia) CPT copyright 2017 American Medical Association. All rights reserved. The codes documented in this report are preliminary and upon coder review may  be revised to meet current compliance requirements.  [name], MD Margretta Sidle B. Bonna Gains MD, MD 01/02/2018 2:32:04 PM This report has been signed electronically. Number of Addenda: 0 Note Initiated On: 01/02/2018 1:53 PM Estimated Blood Loss: Estimated blood loss: none.      [facility]

## 2018-01-02 NOTE — Anesthesia Post-op Follow-up Note (Signed)
Anesthesia QCDR form completed.        

## 2018-01-02 NOTE — H&P (Signed)
[name], MD [address] [place], [place], [place], [address] [address], [place], [place], [place], [address] Phone: [phone]  Fax: [phone]  Primary Care Physician:  Jerrol Banana., MD   Pre-Procedure History & Physical: HPI:  Dwayne Thompson is a 72 y.o. male is here for an EGD.   Past Medical History:  Diagnosis Date  . Back pain   . BPH (benign prostatic hyperplasia)   . ED (erectile dysfunction)     Past Surgical History:  Procedure Laterality Date  . HERNIA REPAIR     1980's    Prior to Admission medications   Medication Sig Start Date End Date Taking? Authorizing Provider  aspirin 81 MG tablet Take 81 mg by mouth daily.    Yes [provider]  Cholecalciferol 1000 UNITS tablet Take 1,000 Units by mouth daily. Reported on 03/03/2016   Yes [provider]  doxazosin (CARDURA) 4 MG tablet TAKE 1 TABLET DAILY 04/14/17  Yes Jerrol Banana., MD  lisinopril (PRINIVIL,ZESTRIL) 10 MG tablet Take 1 tablet (10 mg total) by mouth daily. 10/03/17  Yes Jerrol Banana., MD  potassium chloride (K-DUR) 10 MEQ tablet Take 1 tablet (10 mEq total) by mouth 2 (two) times daily. 12/08/17  Yes Jerrol Banana., MD  rosuvastatin (CRESTOR) 10 MG tablet Take 1 tablet (10 mg total) by mouth daily. 11/06/17  Yes Jerrol Banana., MD  IBU 800 MG tablet TAKE 1 TABLET THREE TIMES A DAY AS NEEDED 02/27/17   Jerrol Banana., MD    Allergies as of 01/01/2018 - Review Complete 01/01/2018  Allergen Reaction Noted  . Tamsulosin hcl Hives 03/28/2015    Family History  Problem Relation Age of Onset  . Alzheimer's disease Mother   . Sarcoidosis Sister   . Diabetes Brother   . Hypertension Brother   . Anxiety disorder Brother   . Kidney disease Neg Hx   . Prostate cancer Neg Hx     Social History   Socioeconomic History  . Marital status: Married    Spouse name: [name]  . Number of children: 3  . Years of education:  69  . Highest education level: Not on file  Occupational History  . Occupation: retired  Scientific laboratory technician  . Financial resource strain: Not on file  . Food insecurity:    Worry: Not on file    Inability: Not on file  . Transportation needs:    Medical: Not on file    Non-medical: Not on file  Tobacco Use  . Smoking status: Former Smoker    Packs/day: 1.00    Years: 50.00    Pack years: 50.00    Types: Cigarettes    Last attempt to quit: 10/20/2013    Years since quitting: 4.2  . Smokeless tobacco: Never Used  Substance and Sexual Activity  . Alcohol use: Yes    Comment: daily, 3 to 4 drinks daily, usually gin and chases it down with soda  . Drug use: No  . Sexual activity: Not Currently  Lifestyle  . Physical activity:    Days per week: Not on file    Minutes per session: Not on file  . Stress: Not on file  Relationships  . Social connections:    Talks on phone: Not on file    Gets together: Not on file    Attends religious service: Not on file    Active member of club or organization: Not  on file    Attends meetings of clubs or organizations: Not on file    Relationship status: Not on file  . Intimate partner violence:    Fear of current or ex partner: Not on file    Emotionally abused: Not on file    Physically abused: Not on file    Forced sexual activity: Not on file  Other Topics Concern  . Not on file  Social History Narrative  . Not on file    Review of Systems: See HPI, otherwise negative ROS  Physical Exam: BP 103/65 (BP Location: Right Arm)   Pulse 81   Temp 98.5 F (36.9 C)   Resp 19   Ht 5\' 11"  (1.803 m)   Wt 193 lb 12.6 oz (87.9 kg)   SpO2 97%   BMI 27.03 kg/m  General:   Alert,  pleasant and cooperative in NAD Head:  Normocephalic and atraumatic. Neck:  Supple; no masses or thyromegaly. Lungs:  Clear throughout to auscultation, normal respiratory effort.    Heart:  +S1, +S2, Regular rate and rhythm, No edema. Abdomen:  Soft, nontender and  nondistended. Normal bowel sounds, without guarding, and without rebound.   Neurologic:  Alert and  oriented x4;  grossly normal neurologically.  Impression/Plan: Dwayne Thompson is here for an EGD for Melena  Risks, benefits, limitations, and alternatives regarding the procedure have been reviewed with the patient.  Questions have been answered.  All parties agreeable.   Virgel Manifold, MD  01/02/2018, 1:49 PM

## 2018-01-02 NOTE — Progress Notes (Signed)
[facility] [place] NAME: Jeremi Losito    MR#:  [id number]  DATE OF BIRTH:  03-28-1946  SUBJECTIVE:  CHIEF COMPLAINT:   Chief Complaint  Patient presents with  . Shortness of Breath  . Dizziness  no complaints  REVIEW OF SYSTEMS:  CONSTITUTIONAL: No fever, fatigue or weakness.  EYES: No blurred or double vision.  EARS, NOSE, AND THROAT: No tinnitus or ear pain.  RESPIRATORY: No cough, shortness of breath, wheezing or hemoptysis.  CARDIOVASCULAR: No chest pain, orthopnea, edema.  GASTROINTESTINAL: No nausea, vomiting, diarrhea or abdominal pain.  GENITOURINARY: No dysuria, hematuria.  ENDOCRINE: No polyuria, nocturia,  HEMATOLOGY: No anemia, easy bruising or bleeding SKIN: No rash or lesion. MUSCULOSKELETAL: No joint pain or arthritis.   NEUROLOGIC: No tingling, numbness, weakness.  PSYCHIATRY: No anxiety or depression.   ROS  DRUG ALLERGIES:   Allergies  Allergen Reactions  . Tamsulosin Hcl Hives  . Sulfa Antibiotics Rash    VITALS:  Blood pressure 117/69, pulse 82, temperature 97.6 F (36.4 C), temperature source Tympanic, resp. rate 20, height 5\' 11"  (1.803 m), weight 87.5 kg (193 lb), SpO2 100 %.  PHYSICAL EXAMINATION:  GENERAL:  72 y.o.-year-old patient lying in the bed with no acute distress.  EYES: Pupils equal, round, reactive to light and accommodation. No scleral icterus. Extraocular muscles intact.  HEENT: Head atraumatic, normocephalic. Oropharynx and nasopharynx clear.  NECK:  Supple, no jugular venous distention. No thyroid enlargement, no tenderness.  LUNGS: Normal breath sounds bilaterally, no wheezing, rales,rhonchi or crepitation. No use of accessory muscles of respiration.  CARDIOVASCULAR: S1, S2 normal. No murmurs, rubs, or gallops.  ABDOMEN: Soft, nontender, nondistended. Bowel sounds present. No organomegaly or mass.  EXTREMITIES: No pedal edema, cyanosis, or clubbing.  NEUROLOGIC: Cranial nerves II  through XII are intact. Muscle strength 5/5 in all extremities. Sensation intact. Gait not checked.  PSYCHIATRIC: The patient is alert and oriented x 3.  SKIN: No obvious rash, lesion, or ulcer.   Physical Exam LABORATORY PANEL:   CBC Recent Labs  Lab 01/02/18 0519  WBC 6.0  HGB 9.0*  HCT 25.5*  PLT 83*   ------------------------------------------------------------------------------------------------------------------  Chemistries  Recent Labs  Lab 01/01/18 1041 01/02/18 0519  NA 140 135  K 3.2* 3.4*  CL 107 109  CO2 25 22  GLUCOSE 119* 96  BUN 52* 23*  CREATININE 1.06 0.81  CALCIUM 9.1 8.2*  AST 26  --   ALT 17  --   ALKPHOS 41  --   BILITOT 0.7  --    ------------------------------------------------------------------------------------------------------------------  Cardiac Enzymes Recent Labs  Lab 01/01/18 1041  TROPONINI <0.03   ------------------------------------------------------------------------------------------------------------------  RADIOLOGY:  Dg Chest 1 View  Result Date: 01/01/2018 CLINICAL DATA:  Shortness of breath, dizziness, and diaphoresis. Former smoker. EXAM: CHEST  1 VIEW COMPARISON:  Chest CT 09/01/2017 and radiographs 05/15/2017 FINDINGS: The cardiomediastinal silhouette is within normal limits. There is evidence of underlying emphysema. The patient has taken a slightly shallower inspiration than on the prior radiographs, and there is minimal heterogeneous opacity in both lung bases. No pleural effusion or pneumothorax is identified. No acute osseous abnormality is seen. IMPRESSION: Minimal bibasilar opacity which may reflect atelectasis or possibly early infection. Electronically Signed   By: Logan Bores M.D.   On: 01/01/2018 11:22    ASSESSMENT AND PLAN:  * acute UGI bleed with melena Gastroenterology input appreciated-for EGD later today continue Continue Protonix drip, H&H every 6 hours, CBC daily, transfuse  as  needed  *Hypertension Stable on current regiment  *Chronic low back pain Stable avoid antiplatelet/NSAIDs/anticoagulants given GI bleeding Continue Tylenol and tramadol  All the records are reviewed and case discussed with Care Management/Social Workerr. Management plans discussed with the patient, family and they are in agreement.  CODE STATUS: full  TOTAL TIME TAKING CARE OF THIS PATIENT: 35 minutes.     POSSIBLE D/C IN 1-2 DAYS, DEPENDING ON CLINICAL CONDITION.   Avel Peace Lou Irigoyen M.D on 01/02/2018   Between 7am to 6pm - Pager - [phone]  After 6pm go to [email or web address] - password [email or web address] [facility]  Office  [phone]  CC: Primary care physician; Jerrol Banana., MD  Note: This dictation was prepared with Dragon dictation along with smaller phrase technology. Any transcriptional errors that result from this process are unintentional.

## 2018-01-02 NOTE — Transfer of Care (Signed)
Immediate Anesthesia Transfer of Care Note  Patient: Dwayne Thompson  Procedure(s) Performed: ESOPHAGOGASTRODUODENOSCOPY (EGD) (N/A )  Patient Location: PACU and Endoscopy Unit  Anesthesia Type:General  Level of Consciousness: sedated  Airway & Oxygen Therapy: Patient Spontanous Breathing  Post-op Assessment: Report given to RN  Post vital signs: stable  Last Vitals:  Vitals Value Taken Time  BP 96/66 01/02/2018  2:28 PM  Temp    Pulse 84 01/02/2018  2:29 PM  Resp 17 01/02/2018  2:29 PM  SpO2 100 % 01/02/2018  2:29 PM  Vitals shown include unvalidated device data.  Last Pain:  Vitals:   01/02/18 1428  TempSrc:   PainSc: Asleep         Complications: No apparent anesthesia complications

## 2018-01-02 NOTE — Anesthesia Preprocedure Evaluation (Signed)
Anesthesia Evaluation  Patient identified by MRN, date of birth, ID band Patient awake    Reviewed: Allergy & Precautions, NPO status , Patient's Chart, lab work & pertinent test results  History of Anesthesia Complications Negative for: history of anesthetic complications  Airway Mallampati: III       Dental  (+) Upper Dentures, Lower Dentures   Pulmonary neg sleep apnea, neg COPD, former smoker,           Cardiovascular hypertension, Pt. on medications (-) Past MI and (-) CHF (-) dysrhythmias (-) Valvular Problems/Murmurs     Neuro/Psych neg Seizures    GI/Hepatic Neg liver ROS, GERD  Medicated and Controlled,  Endo/Other  neg diabetes  Renal/GU negative Renal ROS     Musculoskeletal   Abdominal   Peds  Hematology   Anesthesia Other Findings   Reproductive/Obstetrics                             Anesthesia Physical Anesthesia Plan  ASA: II  Anesthesia Plan: General   Post-op Pain Management:    Induction: Intravenous  PONV Risk Score and Plan: 2 and TIVA and Propofol infusion  Airway Management Planned: Nasal Cannula  Additional Equipment:   Intra-op Plan:   Post-operative Plan:   Informed Consent: I have reviewed the patients History and Physical, chart, labs and discussed the procedure including the risks, benefits and alternatives for the proposed anesthesia with the patient or authorized representative who has indicated his/her understanding and acceptance.     Plan Discussed with:   Anesthesia Plan Comments:         Anesthesia Quick Evaluation

## 2018-01-03 ENCOUNTER — Inpatient Hospital Stay: Payer: Medicare Other | Admitting: Anesthesiology

## 2018-01-03 ENCOUNTER — Inpatient Hospital Stay: Payer: Medicare Other

## 2018-01-03 ENCOUNTER — Encounter
Admission: EM | Disposition: A | Discharge status: Home / Self Care | Payer: Self-pay | Source: Home / Self Care | Attending: Family Medicine

## 2018-01-03 ENCOUNTER — Encounter: Payer: Self-pay | Admitting: *Deleted

## 2018-01-03 DIAGNOSIS — K921 Melena: Secondary | ICD-10-CM | POA: Diagnosis not present

## 2018-01-03 DIAGNOSIS — K3189 Other diseases of stomach and duodenum: Secondary | ICD-10-CM

## 2018-01-03 DIAGNOSIS — K648 Other hemorrhoids: Secondary | ICD-10-CM

## 2018-01-03 DIAGNOSIS — K922 Gastrointestinal hemorrhage, unspecified: Secondary | ICD-10-CM

## 2018-01-03 DIAGNOSIS — D649 Anemia, unspecified: Secondary | ICD-10-CM

## 2018-01-03 HISTORY — PX: ENTEROSCOPY: SHX5533

## 2018-01-03 HISTORY — PX: COLONOSCOPY: SHX5424

## 2018-01-03 LAB — VITAMIN B12: Vitamin B-12: 221 pg/mL (ref 180–914)

## 2018-01-03 LAB — IRON AND TIBC
Iron: 89 ug/dL (ref 45–182)
Saturation Ratios: 34 % (ref 17.9–39.5)
TIBC: 265 ug/dL (ref 250–450)
UIBC: 176 ug/dL

## 2018-01-03 LAB — TSH: TSH: 0.645 u[IU]/mL (ref 0.350–4.500)

## 2018-01-03 LAB — CBC
HCT: 23.6 % — ABNORMAL LOW (ref 40.0–52.0)
Hemoglobin: 8.3 g/dL — ABNORMAL LOW (ref 13.0–18.0)
MCH: 33.4 pg (ref 26.0–34.0)
MCHC: 35.2 g/dL (ref 32.0–36.0)
MCV: 95 fL (ref 80.0–100.0)
Platelets: 84 10*3/uL — ABNORMAL LOW (ref 150–440)
RBC: 2.49 MIL/uL — ABNORMAL LOW (ref 4.40–5.90)
RDW: 14.3 % (ref 11.5–14.5)
WBC: 4.9 10*3/uL (ref 3.8–10.6)

## 2018-01-03 LAB — RETICULOCYTES
RBC.: 2.47 MIL/uL — ABNORMAL LOW (ref 4.40–5.90)
Retic Count, Absolute: 37.1 10*3/uL (ref 19.0–183.0)
Retic Ct Pct: 1.5 % (ref 0.4–3.1)

## 2018-01-03 LAB — FERRITIN: Ferritin: 272 ng/mL (ref 24–336)

## 2018-01-03 LAB — HEMOGLOBIN AND HEMATOCRIT, BLOOD
HCT: 25.4 % — ABNORMAL LOW (ref 40.0–52.0)
Hemoglobin: 8.9 g/dL — ABNORMAL LOW (ref 13.0–18.0)

## 2018-01-03 LAB — FOLATE: Folate: 5.7 ng/mL — ABNORMAL LOW (ref >5.9)

## 2018-01-03 SURGERY — COLONOSCOPY
Anesthesia: General | Laterality: Left

## 2018-01-03 SURGERY — IMAGING PROCEDURE, GI TRACT, INTRALUMINAL, VIA CAPSULE

## 2018-01-03 MED ORDER — PHENYLEPHRINE HCL 10 MG/ML IJ SOLN
INTRAMUSCULAR | Status: DC | PRN
  Administered 2018-01-03 (×2): 100 ug via INTRAVENOUS
  Administered 2018-01-03: 200 ug via INTRAVENOUS
  Administered 2018-01-03 (×3): 100 ug via INTRAVENOUS

## 2018-01-03 MED ORDER — EPHEDRINE SULFATE 50 MG/ML IJ SOLN
INTRAMUSCULAR | Status: DC | PRN
  Administered 2018-01-03: 10 mg via INTRAVENOUS

## 2018-01-03 MED ORDER — SODIUM CHLORIDE 0.9 % IV SOLN
INTRAVENOUS | Status: DC
  Administered 2018-01-03: 11:00:00 via INTRAVENOUS

## 2018-01-03 MED ORDER — MAGNESIUM CITRATE PO SOLN
1.0000 | Freq: Once | ORAL | Status: AC
Start: 2018-01-03 — End: 2018-01-03
  Administered 2018-01-03: 1 via ORAL
  Filled 2018-01-03: qty 296

## 2018-01-03 MED ORDER — PANTOPRAZOLE SODIUM 40 MG PO PACK
20.0000 mg | PACK | Freq: Two times a day (BID) | ORAL | Status: DC
  Administered 2018-01-03: 20 mg via ORAL
  Filled 2018-01-03 (×4): qty 20

## 2018-01-03 MED ORDER — LIDOCAINE 2% (20 MG/ML) 5 ML SYRINGE
INTRAMUSCULAR | Status: DC | PRN
  Administered 2018-01-03: 25 mg via INTRAVENOUS

## 2018-01-03 MED ORDER — PROPOFOL 10 MG/ML IV BOLUS
INTRAVENOUS | Status: DC | PRN
  Administered 2018-01-03: 70 mg via INTRAVENOUS

## 2018-01-03 MED ORDER — GLYCOPYRROLATE 0.2 MG/ML IJ SOLN
INTRAMUSCULAR | Status: DC | PRN
  Administered 2018-01-03: 0.2 mg via INTRAVENOUS

## 2018-01-03 MED ORDER — PROPOFOL 500 MG/50ML IV EMUL
INTRAVENOUS | Status: DC | PRN
  Administered 2018-01-03: 120 ug/kg/min via INTRAVENOUS

## 2018-01-03 NOTE — Anesthesia Preprocedure Evaluation (Signed)
Anesthesia Evaluation  Patient identified by MRN, date of birth, ID band Patient awake    Reviewed: Allergy & Precautions, NPO status , Patient's Chart, lab work & pertinent test results  History of Anesthesia Complications Negative for: history of anesthetic complications  Airway Mallampati: II  TM Distance: >3 FB Neck ROM: Full    Dental  (+) Edentulous Upper, Edentulous Lower   Pulmonary neg sleep apnea, neg COPD, former smoker,    breath sounds clear to auscultation- rhonchi (-) wheezing      Cardiovascular  Rhythm:Regular Rate:Normal - Systolic murmurs and - Diastolic murmurs    Neuro/Psych negative neurological ROS  negative psych ROS   GI/Hepatic Neg liver ROS, GERD  ,GIB    Endo/Other  negative endocrine ROSneg diabetes  Renal/GU negative Renal ROS     Musculoskeletal  (+) Arthritis ,   Abdominal (+) - obese,   Peds  Hematology negative hematology ROS (+)   Anesthesia Other Findings Past Medical History: No date: Back pain No date: BPH (benign prostatic hyperplasia) No date: ED (erectile dysfunction)   Reproductive/Obstetrics                             Anesthesia Physical Anesthesia Plan  ASA: II  Anesthesia Plan: General   Post-op Pain Management:    Induction: Intravenous  PONV Risk Score and Plan: 1 and Propofol infusion  Airway Management Planned: Natural Airway  Additional Equipment:   Intra-op Plan:   Post-operative Plan:   Informed Consent: I have reviewed the patients History and Physical, chart, labs and discussed the procedure including the risks, benefits and alternatives for the proposed anesthesia with the patient or authorized representative who has indicated his/her understanding and acceptance.   Dental advisory given  Plan Discussed with: CRNA and Anesthesiologist  Anesthesia Plan Comments:         Anesthesia Quick Evaluation

## 2018-01-03 NOTE — Anesthesia Postprocedure Evaluation (Signed)
Anesthesia Post Note  Patient: Dwayne Thompson  Procedure(s) Performed: COLONOSCOPY (Left ) ENTEROSCOPY  Patient location during evaluation: Endoscopy Anesthesia Type: General Level of consciousness: awake and alert and oriented Pain management: pain level controlled Vital Signs Assessment: post-procedure vital signs reviewed and stable Respiratory status: spontaneous breathing, nonlabored ventilation and respiratory function stable Cardiovascular status: blood pressure returned to baseline and stable Postop Assessment: no signs of nausea or vomiting Anesthetic complications: no     Last Vitals:  Vitals:   01/03/18 1253 01/03/18 1303  BP: 118/67   Pulse: 78   Resp: (!) 21   Temp:    SpO2: 98% 97%    Last Pain:  Vitals:   01/03/18 1253  TempSrc:   PainSc: 0-No pain                 Janisa Labus

## 2018-01-03 NOTE — Op Note (Signed)
[facility] Gastroenterology Patient Name: Dwayne Thompson Procedure Date: 01/03/2018 11:14 AM MRN: [id number] Account #: [id number] Date of Birth: 24-Jun-1946 Admit Type: Inpatient Age: 72 Room: [facility] ENDO ROOM 2 Gender: Male Note Status: Finalized Procedure:            Colonoscopy Indications:          Melena Providers:            Lorri Fukuhara B. Bonna Gains MD, MD Referring MD:         Janine Ores. Rosanna Randy, MD (Referring MD) Medicines:            Monitored Anesthesia Care Complications:        No immediate complications. Procedure:            Pre-Anesthesia Assessment:                       - Prior to the procedure, a History and Physical was                        performed, and patient medications, allergies and                        sensitivities were reviewed. The patient's tolerance of                        previous anesthesia was reviewed.                       - The risks and benefits of the procedure and the                        sedation options and risks were discussed with the                        patient. All questions were answered and informed                        consent was obtained.                       - Patient identification and proposed procedure were                        verified prior to the procedure by the physician, the                        nurse, the anesthesiologist, the anesthetist and the                        technician. The procedure was verified in the                        pre-procedure area in the procedure room in the                        endoscopy suite.                       - Prophylactic Antibiotics: The patient does not  require prophylactic antibiotics.                       - ASA Grade Assessment: II - A patient with mild                        systemic disease.                       - After reviewing the risks and benefits, the patient                        was deemed in satisfactory condition  to undergo the                        procedure.                       - Monitored anesthesia care was determined to be                        medically necessary for this procedure based on review                        of the patient's medical history, medications, and                        prior anesthesia history.                       - The anesthesia plan was to use monitored anesthesia                        care (MAC).                       After obtaining informed consent, the colonoscope was                        passed under direct vision. Throughout the procedure,                        the patient's blood pressure, pulse, and oxygen                        saturations were monitored continuously. The [place] [id number]) was introduced through the                        anus and advanced to the the cecum, identified by                        appendiceal orifice and ileocecal valve. The                        colonoscopy was performed with ease. The patient                        tolerated the procedure well. The quality of the bowel  preparation was poor. Findings:      Melanotic liquid sticky thick material was found in the entire colon.      There is no endoscopic evidence of red blood or actively bleeding       lesions or mass in the entire colon.      Due to back liquid material covering the entire colon which was hard to       suction, this is not an exam for small lesions or polyps.      Non-bleeding internal hemorrhoids were found during retroflexion. The       hemorrhoids were small. Impression:           - Preparation of the colon was poor.                       - Melena (but no red blood) in the entire examined                        colon. This is consistent with bleeding from the                        proximal GI tract i.e. Upper GI tract or small bowel                       - Due to back liquid  material covering the entire colon                        which was hard to suction, this is not an exam for                        small lesions or polyps.                       - Non-bleeding internal hemorrhoids.                       - No specimens collected. Recommendation:       - Repeat EGD today to rule out any dieulafoy lesions.                       If EGD negative today proceed to small bowel capsule                        today                       - See EGD report from today for further findings and                        details.                       - Continue Serial CBCs and transfuse PRN                       - Avoid NSAIDs except Aspirin if medically indicated                       - Continue present medications.                       -  The findings and recommendations were discussed with                        the patient.                       - The findings and recommendations were discussed with                        the patient's family. Procedure Code(s):    --- Professional ---                       [phone], Colonoscopy, flexible; diagnostic, including                        collection of specimen(s) by brushing or washing, when                        performed (separate procedure) Diagnosis Code(s):    --- Professional ---                       K64.8, Other hemorrhoids                       K92.2, Gastrointestinal hemorrhage, unspecified                       K92.1, Melena (includes Hematochezia) CPT copyright 2017 American Medical Association. All rights reserved. The codes documented in this report are preliminary and upon coder review may  be revised to meet current compliance requirements.  [name], MD Margretta Sidle B. Bonna Gains MD, MD 01/03/2018 12:08:01 PM This report has been signed electronically. Number of Addenda: 0 Note Initiated On: 01/03/2018 11:14 AM Scope Withdrawal Time: 0 hours 17 minutes 45 seconds  Total Procedure Duration: 0 hours 31  minutes 28 seconds  Estimated Blood Loss: Estimated blood loss: none.      [facility]

## 2018-01-03 NOTE — Anesthesia Postprocedure Evaluation (Signed)
Anesthesia Post Note  Patient: Dwayne Thompson  Procedure(s) Performed: ESOPHAGOGASTRODUODENOSCOPY (EGD) (N/A )  Patient location during evaluation: Endoscopy Anesthesia Type: General Level of consciousness: awake and alert Pain management: pain level controlled Vital Signs Assessment: post-procedure vital signs reviewed and stable Respiratory status: spontaneous breathing and respiratory function stable Cardiovascular status: stable Anesthetic complications: no     Last Vitals:  Vitals:   01/03/18 1303 01/03/18 1441  BP:  121/73  Pulse:  72  Resp:  16  Temp:  37 C  SpO2: 97% 98%    Last Pain:  Vitals:   01/03/18 1441  TempSrc: Oral  PainSc:                  Merrel Crabbe K

## 2018-01-03 NOTE — Progress Notes (Signed)
[place] [facility] [place] NAME: Dwayne Thompson    MR#:  [id number]  DATE OF BIRTH:  Feb 09, 1946  SUBJECTIVE:  CHIEF COMPLAINT:   Chief Complaint  Patient presents with  . Shortness of Breath  . Dizziness  Patient without complaint, gastroenterology input appreciated-EGD noted  REVIEW OF SYSTEMS:  CONSTITUTIONAL: No fever, fatigue or weakness.  EYES: No blurred or double vision.  EARS, NOSE, AND THROAT: No tinnitus or ear pain.  RESPIRATORY: No cough, shortness of breath, wheezing or hemoptysis.  CARDIOVASCULAR: No chest pain, orthopnea, edema.  GASTROINTESTINAL: No nausea, vomiting, diarrhea or abdominal pain.  GENITOURINARY: No dysuria, hematuria.  ENDOCRINE: No polyuria, nocturia,  HEMATOLOGY: No anemia, easy bruising or bleeding SKIN: No rash or lesion. MUSCULOSKELETAL: No joint pain or arthritis.   NEUROLOGIC: No tingling, numbness, weakness.  PSYCHIATRY: No anxiety or depression.   ROS  DRUG ALLERGIES:   Allergies  Allergen Reactions  . Tamsulosin Hcl Hives  . Sulfa Antibiotics Rash    VITALS:  Blood pressure 101/68, pulse 84, temperature 98.9 F (37.2 C), temperature source Oral, resp. rate 16, height 5\' 11"  (1.803 m), weight 92.1 kg (203 lb 0.7 oz), SpO2 100 %.  PHYSICAL EXAMINATION:  GENERAL:  72 y.o.-year-old patient lying in the bed with no acute distress.  EYES: Pupils equal, round, reactive to light and accommodation. No scleral icterus. Extraocular muscles intact.  HEENT: Head atraumatic, normocephalic. Oropharynx and nasopharynx clear.  NECK:  Supple, no jugular venous distention. No thyroid enlargement, no tenderness.  LUNGS: Normal breath sounds bilaterally, no wheezing, rales,rhonchi or crepitation. No use of accessory muscles of respiration.  CARDIOVASCULAR: S1, S2 normal. No murmurs, rubs, or gallops.  ABDOMEN: Soft, nontender, nondistended. Bowel sounds present. No organomegaly or mass.  EXTREMITIES: No pedal edema,  cyanosis, or clubbing.  NEUROLOGIC: Cranial nerves II through XII are intact. Muscle strength 5/5 in all extremities. Sensation intact. Gait not checked.  PSYCHIATRIC: The patient is alert and oriented x 3.  SKIN: No obvious rash, lesion, or ulcer.   Physical Exam LABORATORY PANEL:   CBC Recent Labs  Lab 01/03/18 0504  WBC 4.9  HGB 8.3*  HCT 23.6*  PLT 84*   ------------------------------------------------------------------------------------------------------------------  Chemistries  Recent Labs  Lab 01/01/18 1041 01/02/18 0519  NA 140 135  K 3.2* 3.4*  CL 107 109  CO2 25 22  GLUCOSE 119* 96  BUN 52* 23*  CREATININE 1.06 0.81  CALCIUM 9.1 8.2*  AST 26  --   ALT 17  --   ALKPHOS 41  --   BILITOT 0.7  --    ------------------------------------------------------------------------------------------------------------------  Cardiac Enzymes Recent Labs  Lab 01/01/18 1041  TROPONINI <0.03   ------------------------------------------------------------------------------------------------------------------  RADIOLOGY:  Dg Chest 1 View  Result Date: 01/01/2018 CLINICAL DATA:  Shortness of breath, dizziness, and diaphoresis. Former smoker. EXAM: CHEST  1 VIEW COMPARISON:  Chest CT 09/01/2017 and radiographs 05/15/2017 FINDINGS: The cardiomediastinal silhouette is within normal limits. There is evidence of underlying emphysema. The patient has taken a slightly shallower inspiration than on the prior radiographs, and there is minimal heterogeneous opacity in both lung bases. No pleural effusion or pneumothorax is identified. No acute osseous abnormality is seen. IMPRESSION: Minimal bibasilar opacity which may reflect atelectasis or possibly early infection. Electronically Signed   By: Logan Bores M.D.   On: 01/01/2018 11:22    ASSESSMENT AND PLAN:  * acute UGI bleed with melena Stable Gastroenterology input appreciated-EGD noted for erythema/biopsies taken, for colonoscopy  later on day, continue Protonix 20 mg twice daily per gastroenterology, CBC daily, anemia workup, and transfuse as needed   *Hypertension Stable on current regiment  *Acute thrombocytopenia Consult hematology/oncology for further evaluation, check abdominal ultrasound, follow-up on anemia workup  *Chronic low back pain Stable avoid antiplatelet/NSAIDs/anticoagulants given GI bleeding Continue Tylenol and tramadol  Disposition to home on tomorrow barring any complications   All the records are reviewed and case discussed with Care Management/Social Workerr. Management plans discussed with the patient, family and they are in agreement.  CODE STATUS: full  TOTAL TIME TAKING CARE OF THIS PATIENT: 35 minutes.     POSSIBLE D/C IN 1-2 DAYS, DEPENDING ON CLINICAL CONDITION.   Avel Peace Alyria Krack M.D on 01/03/2018   Between 7am to 6pm - Pager - [phone]  After 6pm go to [email or web address] - password [email or web address] [facility]  Office  [phone]  CC: Primary care physician; Jerrol Banana., MD  Note: This dictation was prepared with Dragon dictation along with smaller phrase technology. Any transcriptional errors that result from this process are unintentional.

## 2018-01-03 NOTE — Transfer of Care (Signed)
Immediate Anesthesia Transfer of Care Note  Patient: Dwayne Thompson  Procedure(s) Performed: COLONOSCOPY (Left ) ESOPHAGOGASTRODUODENOSCOPY (EGD) WITH PROPOFOL ENTEROSCOPY  Patient Location: PACU and Endoscopy Unit  Anesthesia Type:General  Level of Consciousness: awake and patient cooperative  Airway & Oxygen Therapy: Patient Spontanous Breathing and Patient connected to nasal cannula oxygen  Post-op Assessment: Report given to RN and Post -op Vital signs reviewed and stable  Post vital signs: Reviewed and stable  Last Vitals:  Vitals Value Taken Time  BP    Temp    Pulse    Resp    SpO2      Last Pain:  Vitals:   01/03/18 1105  TempSrc: Tympanic  PainSc: 0-No pain         Complications: No apparent anesthesia complications

## 2018-01-03 NOTE — Op Note (Signed)
[facility] Gastroenterology Patient Name: Dwayne Thompson Procedure Date: 01/03/2018 12:09 PM MRN: [id number] Account #: [id number] Date of Birth: 1945/12/28 Admit Type: Inpatient Age: 72 Room: [facility] ENDO ROOM 2 Gender: Male Note Status: Finalized Procedure:            Small bowel enteroscopy Indications:          Melena Providers:            Lavon Bothwell B. Bonna Gains MD, MD Referring MD:         Janine Ores. Rosanna Randy, MD (Referring MD) Complications:        No immediate complications. Procedure:            Pre-Anesthesia Assessment:                       - Prior to the procedure, a History and Physical was                        performed, and patient medications, allergies and                        sensitivities were reviewed. The patient's tolerance of                        previous anesthesia was reviewed.                       - The risks and benefits of the procedure and the                        sedation options and risks were discussed with the                        patient. All questions were answered and informed                        consent was obtained.                       - Patient identification and proposed procedure were                        verified prior to the procedure by the physician, the                        nurse, the anesthesiologist, the anesthetist and the                        technician. The procedure was verified in the                        pre-procedure area in the procedure room in the                        endoscopy suite.                       - ASA Grade Assessment: II - A patient with mild                        systemic disease.                       -  After reviewing the risks and benefits, the patient                        was deemed in satisfactory condition to undergo the                        procedure.                       - Monitored anesthesia care under the supervision of an                         anesthesiologist was determined to be medically                        necessary for this procedure based on review of the                        patient's medical history, medications, and prior                        anesthesia history.                       After obtaining informed consent, the endoscope was                        passed under direct vision. Throughout the procedure,                        the patient's blood pressure, pulse, and oxygen                        saturations were monitored continuously. The                        Colonoscope was introduced through the mouth and                        advanced to the mid-jejunum. The small bowel                        enteroscopy was accomplished with ease. The patient                        tolerated the procedure well. Findings:      The examined esophagus was normal.      Patchy mildly erythematous mucosa without bleeding was found in the       stomach. No biopsies done as it was biopsied on yesterday's EGD and       previous biopsy sites without bleeding was noted.      Patchy erythematous mucosa without active bleeding and with no stigmata       of bleeding was found in the duodenal bulb. Clips placed on yesterday's       EGD noted without any bleeding present.      There was no evidence of significant pathology in the second portion of       the duodenum, in the third portion of the duodenum and in the fourth       portion of the duodenum.      There was no  evidence of significant pathology in the entire examined       portion of jejunum.      There was no evidence of significant pathology until extent of exam at       160 cm (from the incisors). Impression:           - Normal esophagus.                       - Erythematous mucosa in the stomach.                       - Erythematous duodenopathy.                       - Normal second portion of the duodenum, third portion                        of the duodenum and  fourth portion of the duodenum.                       - The examined portion of the jejunum was normal.                       - The examined portion of the jejunum was normal.                       - No specimens collected.                       - Colonoscopy today showed melena throughout the colon                        (limiting visualization as a result) with no red blood                        or actively bleeding lesions. Enteroscopy today did not                        show any actively bleeding lesions or any lesions to                        contribute to his melena. Thus, source of GI Bleed is                        likely in the more distal small bowel. Recommendation:       - To visualize the small bowel, perform video capsule                        endoscopy today.                       - Continue Serial CBCs and transfuse PRN                       - Avoid NSAIDs except Aspirin if medically indicated                       - Take prescribed proton pump inhibitor or H2 blocker                        (  antacid) medications 30 - 60 minutes before meals.                       - If patient has acute drop in Hemoglobin or acute GI                        bleeding, please order RBC scan or CTA at the time of                        the bleeding to localize bleeding source.                       - Repeat Colonoscopy with another day of prep can be                        considered, to completely clean out the colon as                        melanotic stool covered the entire colon, if small                        bowel capsule is normal. However since no red blood or                        actively bleeding lesions were seen in the colon, colon                        source of bleeding is less likely.                       - The findings and recommendations were discussed with                        the patient.                       - The findings and recommendations were discussed with                         the patient's family. Procedure Code(s):    --- Professional ---                       [phone], Small intestinal endoscopy, enteroscopy beyond                        second portion of duodenum, not including ileum;                        diagnostic, including collection of specimen(s) by                        brushing or washing, when performed (separate procedure) Diagnosis Code(s):    --- Professional ---                       K31.89, Other diseases of stomach and duodenum                       K92.1, Melena (includes Hematochezia) CPT copyright 2017 American Medical Association. All rights  reserved. The codes documented in this report are preliminary and upon coder review may  be revised to meet current compliance requirements.  [name], MD Margretta Sidle B. Bonna Gains MD, MD 01/03/2018 12:46:36 PM This report has been signed electronically. Number of Addenda: 0 Note Initiated On: 01/03/2018 12:09 PM      [facility]

## 2018-01-03 NOTE — Anesthesia Post-op Follow-up Note (Signed)
Anesthesia QCDR form completed.        

## 2018-01-03 NOTE — H&P (Signed)
Dwayne Antigua, MD [address] [place], [place], [place], [address] [address], [place], [place], [place], [address] Phone: [phone]  Fax: [phone]  Primary Care Physician:  Jerrol Banana., MD   Pre-Procedure History & Physical: HPI:  Dwayne Thompson is a 72 y.o. male is here for a colonoscopy.   Past Medical History:  Diagnosis Date  . Back pain   . BPH (benign prostatic hyperplasia)   . ED (erectile dysfunction)     Past Surgical History:  Procedure Laterality Date  . HERNIA REPAIR     1980's    Prior to Admission medications   Medication Sig Start Date End Date Taking? Authorizing Provider  aspirin 81 MG tablet Take 81 mg by mouth daily.    Yes [provider]  Cholecalciferol 1000 UNITS tablet Take 1,000 Units by mouth daily. Reported on 03/03/2016   Yes [provider]  doxazosin (CARDURA) 4 MG tablet TAKE 1 TABLET DAILY 04/14/17  Yes Jerrol Banana., MD  lisinopril (PRINIVIL,ZESTRIL) 10 MG tablet Take 1 tablet (10 mg total) by mouth daily. 10/03/17  Yes Jerrol Banana., MD  potassium chloride (K-DUR) 10 MEQ tablet Take 1 tablet (10 mEq total) by mouth 2 (two) times daily. 12/08/17  Yes Jerrol Banana., MD  rosuvastatin (CRESTOR) 10 MG tablet Take 1 tablet (10 mg total) by mouth daily. 11/06/17  Yes Jerrol Banana., MD  IBU 800 MG tablet TAKE 1 TABLET THREE TIMES A DAY AS NEEDED 02/27/17   Jerrol Banana., MD    Allergies as of 01/01/2018 - Review Complete 01/01/2018  Allergen Reaction Noted  . Tamsulosin hcl Hives 03/28/2015    Family History  Problem Relation Age of Onset  . Alzheimer's disease Mother   . Sarcoidosis Sister   . Diabetes Brother   . Hypertension Brother   . Anxiety disorder Brother   . Kidney disease Neg Hx   . Prostate cancer Neg Hx     Social History   Socioeconomic History  . Marital status: Married    Spouse name: [name]  . Number of children: 3  . Years of  education: 45  . Highest education level: Not on file  Occupational History  . Occupation: retired  Scientific laboratory technician  . Financial resource strain: Not on file  . Food insecurity:    Worry: Not on file    Inability: Not on file  . Transportation needs:    Medical: Not on file    Non-medical: Not on file  Tobacco Use  . Smoking status: Former Smoker    Packs/day: 1.00    Years: 50.00    Pack years: 50.00    Types: Cigarettes    Last attempt to quit: 10/20/2013    Years since quitting: 4.2  . Smokeless tobacco: Never Used  Substance and Sexual Activity  . Alcohol use: Yes    Comment: daily, 3 to 4 drinks daily, usually gin and chases it down with soda  . Drug use: No  . Sexual activity: Not Currently  Lifestyle  . Physical activity:    Days per week: Not on file    Minutes per session: Not on file  . Stress: Not on file  Relationships  . Social connections:    Talks on phone: Not on file    Gets together: Not on file    Attends religious service: Not on file    Active member of club or organization: Not  on file    Attends meetings of clubs or organizations: Not on file    Relationship status: Not on file  . Intimate partner violence:    Fear of current or ex partner: Not on file    Emotionally abused: Not on file    Physically abused: Not on file    Forced sexual activity: Not on file  Other Topics Concern  . Not on file  Social History Narrative  . Not on file    Review of Systems: See HPI, otherwise negative ROS  Physical Exam: BP 101/68 (BP Location: Right Arm)   Pulse 84   Temp 98.9 F (37.2 C) (Oral)   Resp 16   Ht 5\' 11"  (1.803 m)   Wt 203 lb 0.7 oz (92.1 kg)   SpO2 100%   BMI 28.32 kg/m  General:   Alert,  pleasant and cooperative in NAD Head:  Normocephalic and atraumatic. Neck:  Supple; no masses or thyromegaly. Lungs:  Clear throughout to auscultation, normal respiratory effort.    Heart:  +S1, +S2, Regular rate and rhythm, No edema. Abdomen:   Soft, nontender and nondistended. Normal bowel sounds, without guarding, and without rebound.   Neurologic:  Alert and  oriented x4;  grossly normal neurologically.  Impression/Plan: Dwayne Thompson is here for a colonoscopy to be performed for melena Risks, benefits, limitations, and alternatives regarding  colonoscopy have been reviewed with the patient.  Questions have been answered.  All parties agreeable.   Virgel Manifold, MD  01/03/2018, 10:25 AM

## 2018-01-04 DIAGNOSIS — D500 Iron deficiency anemia secondary to blood loss (chronic): Secondary | ICD-10-CM

## 2018-01-04 DIAGNOSIS — R42 Dizziness and giddiness: Secondary | ICD-10-CM

## 2018-01-04 DIAGNOSIS — R0602 Shortness of breath: Secondary | ICD-10-CM

## 2018-01-04 DIAGNOSIS — Z7982 Long term (current) use of aspirin: Secondary | ICD-10-CM

## 2018-01-04 DIAGNOSIS — E538 Deficiency of other specified B group vitamins: Secondary | ICD-10-CM

## 2018-01-04 DIAGNOSIS — Z87891 Personal history of nicotine dependence: Secondary | ICD-10-CM

## 2018-01-04 DIAGNOSIS — F1010 Alcohol abuse, uncomplicated: Secondary | ICD-10-CM

## 2018-01-04 DIAGNOSIS — Z79899 Other long term (current) drug therapy: Secondary | ICD-10-CM

## 2018-01-04 DIAGNOSIS — Z8711 Personal history of peptic ulcer disease: Secondary | ICD-10-CM

## 2018-01-04 DIAGNOSIS — D696 Thrombocytopenia, unspecified: Secondary | ICD-10-CM

## 2018-01-04 DIAGNOSIS — M549 Dorsalgia, unspecified: Secondary | ICD-10-CM

## 2018-01-04 LAB — CBC
HCT: 22.9 % — ABNORMAL LOW (ref 40.0–52.0)
Hemoglobin: 8.2 g/dL — ABNORMAL LOW (ref 13.0–18.0)
MCH: 33.9 pg (ref 26.0–34.0)
MCHC: 36 g/dL (ref 32.0–36.0)
MCV: 94.3 fL (ref 80.0–100.0)
Platelets: 86 10*3/uL — ABNORMAL LOW (ref 150–440)
RBC: 2.42 MIL/uL — ABNORMAL LOW (ref 4.40–5.90)
RDW: 13.8 % (ref 11.5–14.5)
WBC: 5.7 10*3/uL (ref 3.8–10.6)

## 2018-01-04 LAB — SURGICAL PATHOLOGY

## 2018-01-04 LAB — TYPE AND SCREEN
ABO/RH(D): A POS
Antibody Screen: NEGATIVE

## 2018-01-04 MED ORDER — ASPIRIN EC 81 MG PO TBEC
81.0000 mg | DELAYED_RELEASE_TABLET | Freq: Every day | ORAL | Status: DC
  Administered 2018-01-04: 81 mg via ORAL
  Filled 2018-01-04: qty 1

## 2018-01-04 MED ORDER — ASPIRIN 81 MG PO TBEC: 81.0000 mg | DELAYED_RELEASE_TABLET | Freq: Every day | ORAL | 0 refills | Status: DC

## 2018-01-04 MED ORDER — CYANOCOBALAMIN 1000 MCG PO TABS: 1000.0000 ug | ORAL_TABLET | Freq: Every day | ORAL | 1 refills | Status: AC

## 2018-01-04 MED ORDER — CYANOCOBALAMIN 1000 MCG/ML IJ SOLN
1000.0000 ug | Freq: Once | INTRAMUSCULAR | Status: AC
  Administered 2018-01-04: 1000 ug via INTRAMUSCULAR
  Filled 2018-01-04: qty 1

## 2018-01-04 MED ORDER — ACETAMINOPHEN 325 MG PO TABS
650.0000 mg | ORAL_TABLET | Freq: Three times a day (TID) | ORAL | 0 refills | Status: AC
Start: 2018-01-04 — End: ?

## 2018-01-04 MED ORDER — FOLIC ACID 1 MG PO TABS: 1.0000 mg | ORAL_TABLET | Freq: Every day | ORAL | 0 refills | Status: DC

## 2018-01-04 MED ORDER — SODIUM CHLORIDE 0.9 % IV SOLN
1.0000 mg | Freq: Once | INTRAVENOUS | Status: AC
  Administered 2018-01-04: 1 mg via INTRAVENOUS
  Filled 2018-01-04: qty 0.2

## 2018-01-04 MED ORDER — GABAPENTIN 300 MG PO CAPS: 300.0000 mg | ORAL_CAPSULE | Freq: Every day | ORAL | 2 refills | Status: DC

## 2018-01-04 MED ORDER — VITAMIN B-12 1000 MCG PO TABS: 1000.0000 ug | ORAL_TABLET | Freq: Every day | ORAL | Status: DC

## 2018-01-04 MED ORDER — GABAPENTIN 100 MG PO CAPS: 100.0000 mg | ORAL_CAPSULE | Freq: Two times a day (BID) | ORAL | 2 refills | Status: DC

## 2018-01-04 MED ORDER — PANTOPRAZOLE SODIUM 40 MG PO PACK: PACK | ORAL | 0 refills | Status: DC

## 2018-01-04 MED ORDER — FOLIC ACID 1 MG PO TABS: 1.0000 mg | ORAL_TABLET | Freq: Every day | ORAL | Status: DC

## 2018-01-04 NOTE — Consult Note (Signed)
[facility]  Date of admission:  01/01/2018  Inpatient day:  01/04/2018  Consulting physician: Dr. Holly Bodily Salary   Reason for Consultation:  Anemia, thrombocytopenia  Chief Complaint: Dwayne Thompson is a 72 y.o. male who was admitted through the emergency room with symptomatic anemia secondary to an upper GI bleed.  HPI: The patient has a history of peptic ulcer disease 30 years ago.  He takes baby aspirin and ibuprofen for back pain.  He presented with 3 days of dizziness and shortness of breath.  He noted melena x 2 on 12/31/2017.  ER evaluation revealed guaiac + stool.  CBC revealed a hematocrit of 34, hemoglobin 12.0, MCV 94.5, and platelets 118,000.  Prior CBC on 12/05/2017 revealed a hematocrit of 40.9 and hemoglobin 14.9.  Hemoglobin has been followed: 9.5 on 01/01/2018, 8.6 on 01/02/2018, and 8.3 on 01/03/2018.  He has had chronic mild thrombocytopenia since 04/2017.  Platelet count was 101,000 - 134,000.  During this admission, platelet count has decreased: 118,000 on 01/01/2018, 83,000 on 01/02/2018, and 84,000 on 01/03/2018.  Additional labs are notable for B12 and folate deficiency.  B12 was 221.  Folate was 5.7 on 01/03/2018.  TSH was normal.  Upper endoscopy on 01/02/2018 revealed a normal esophagus, erythematous mucosa in the antrum, erythematous duodenopathy, and nodular mucosa in the duodenal bulb.  There was no ulcerations or active sites of bleeding.  Biopsies are pending.  Colonoscopy on 01/03/2018 revealed melena in the entire colon c/w bleeding from the proximal GI tract.  Small bowel enteroscopy on 01/03/2018 revealed no actively bleeding lesions.  The examined portion of the jejunum was normal.  The source of bleeding was felt to be in the more distal small bowel.    He states that he underwent capsule study last night.  He states that he eats meat daily.  He eats green leafy vegetables 2-3x/week.  He denies any pica.  He is on Protonix  BID.  Symptomatically, he feels "ok".  He continues to have melena.   Past Medical History:  Diagnosis Date  . Back pain   . BPH (benign prostatic hyperplasia)   . ED (erectile dysfunction)     Past Surgical History:  Procedure Laterality Date  . COLONOSCOPY Left 01/03/2018   Procedure: COLONOSCOPY;  Surgeon: Virgel Manifold, MD;  Location: [facility] ENDOSCOPY;  Service: Endoscopy;  Laterality: Left;  . ENTEROSCOPY  01/03/2018   Procedure: ENTEROSCOPY;  Surgeon: Virgel Manifold, MD;  Location[facility] ENDOSCOPY;  Service: Endoscopy;;  . ESOPHAGOGASTRODUODENOSCOPY N/A 01/02/2018   Procedure: ESOPHAGOGASTRODUODENOSCOPY (EGD);  Surgeon: Virgel Manifold, MD;  Location: [facility] ENDOSCOPY;  Service: Endoscopy;  Laterality: N/A;  . HERNIA REPAIR     1980's    Family History  Problem Relation Age of Onset  . Alzheimer's disease Mother   . Sarcoidosis Sister   . Diabetes Brother   . Hypertension Brother   . Anxiety disorder Brother   . Kidney disease Neg Hx   . Prostate cancer Neg Hx     Social History:  reports that he quit smoking about 4 years ago. His smoking use included cigarettes. He has a 50.00 pack-year smoking history. He has never used smokeless tobacco. He reports that he drinks alcohol. He reports that he does not use drugs.  He drinks 2-3 alcohol shots/day.  He is alone today.  Allergies:  Allergies  Allergen Reactions  . Tamsulosin Hcl Hives  . Sulfa Antibiotics Rash    Medications Prior to Admission  Medication  Sig Dispense Refill  . aspirin 81 MG tablet Take 81 mg by mouth daily.     . Cholecalciferol 1000 UNITS tablet Take 1,000 Units by mouth daily. Reported on 03/03/2016    . doxazosin (CARDURA) 4 MG tablet TAKE 1 TABLET DAILY 90 tablet 3  . lisinopril (PRINIVIL,ZESTRIL) 10 MG tablet Take 1 tablet (10 mg total) by mouth daily. 90 tablet 3  . potassium chloride (K-DUR) 10 MEQ tablet Take 1 tablet (10 mEq total) by mouth 2 (two) times daily. 60 tablet 3  .  rosuvastatin (CRESTOR) 10 MG tablet Take 1 tablet (10 mg total) by mouth daily. 90 tablet 3  . IBU 800 MG tablet TAKE 1 TABLET THREE TIMES A DAY AS NEEDED 270 tablet 2    Review of Systems: GENERAL:  Feels "ok".  No fevers or sweats.  Weight loss of 7 pounds since admission. PERFORMANCE STATUS (ECOG):  1 HEENT:  Sore throat s/p endoscopy.  No visual changes, runny nose, mouth sores or tenderness. Lungs: Shortness of breath with exertion.  No cough.  No hemoptysis. Cardiac:  No chest pain, palpitations, orthopnea, or PND. GI:  Abdominal cramping with bowel movements.  Ongoing melena.  No nausea, vomiting, diarrhea, constipation, or hematochezia. GU:  No urgency, frequency, dysuria, or hematuria. Musculoskeletal:  No back pain.  No joint pain.  No muscle tenderness. Extremities:  No pain or swelling. Skin:  No rashes or skin changes. Neuro:  No headache, numbness or weakness, balance or coordination issues. Endocrine:  No diabetes, thyroid issues, hot flashes or night sweats. Psych:  No mood changes, depression or anxiety. Pain:  No focal pain. Review of systems:  All other systems reviewed and found to be negative.  Physical Exam:  Blood pressure 121/66, pulse 71, temperature 99 F (37.2 C), temperature source Oral, resp. rate 20, height 5\' 11"  (0.160 m), weight 207 lb 0.2 oz (93.9 kg), SpO2 99 %.  GENERAL:  Well developed, well nourished, gentleman sitting comfortably on the medical unit in no acute distress. MENTAL STATUS:  Alert and oriented to person, place and time. HEAD:  Black hair with mustache.  Male pattern baldness.  Normocephalic, atraumatic, face symmetric, no Cushingoid features. EYES:  Brown eyes.  Pupils equal round and reactive to light and accomodation.  No conjunctivitis or scleral icterus. ENT:  Oropharynx clear without lesion.  Tongue normal.  Edentulous.  Mucous membranes moist.  RESPIRATORY:  Clear to auscultation without rales, wheezes or rhonchi. CARDIOVASCULAR:   Regular rate and rhythm without murmur, rub or gallop. ABDOMEN:  Soft, non-tender, with active bowel sounds, and no hepatosplenomegaly.  No masses. SKIN:  No rashes, ulcers or lesions. EXTREMITIES: No edema, no skin discoloration or tenderness.  No palpable cords. LYMPH NODES: No palpable cervical, supraclavicular, axillary or inguinal adenopathy  NEUROLOGICAL: Unremarkable. PSYCH:  Appropriate.   Results for orders placed or performed during the hospital encounter of 01/01/18 (from the past 48 hour(s))  Hemoglobin and hematocrit, blood     Status: Abnormal   Collection Time: 01/02/18  4:46 PM  Result Value Ref Range   Hemoglobin 8.6 (L) 13.0 - 18.0 g/dL   HCT 24.0 (L) 40.0 - 52.0 %    Comment: Performed at [facility], [address] [place], [place] [address]  Hemoglobin and hematocrit, blood     Status: Abnormal   Collection Time: 01/02/18 10:36 PM  Result Value Ref Range   Hemoglobin 8.9 (L) 13.0 - 18.0 g/dL   HCT 25.4 (L) 40.0 - 52.0 %  Comment: Performed at [facility], [address] [place], [place] [address]  CBC     Status: Abnormal   Collection Time: 01/03/18  5:04 AM  Result Value Ref Range   WBC 4.9 3.8 - 10.6 K/uL   RBC 2.49 (L) 4.40 - 5.90 MIL/uL   Hemoglobin 8.3 (L) 13.0 - 18.0 g/dL   HCT 23.6 (L) 40.0 - 52.0 %   MCV 95.0 80.0 - 100.0 fL   MCH 33.4 26.0 - 34.0 pg   MCHC 35.2 32.0 - 36.0 g/dL   RDW 14.3 11.5 - 14.5 %   Platelets 84 (L) 150 - 440 K/uL    Comment: Performed at [facility], [address] [place], [place] [address]  Iron and TIBC     Status: None   Collection Time: 01/03/18  5:04 AM  Result Value Ref Range   Iron 89 45 - 182 ug/dL   TIBC 265 250 - 450 ug/dL   Saturation Ratios 34 17.9 - 39.5 %   UIBC 176 ug/dL    Comment: Performed at [facility], [address] [place], [place] [address]  Ferritin     Status: None   Collection Time: 01/03/18  5:04 AM  Result Value Ref Range   Ferritin 272 24  - 336 ng/mL    Comment: Performed at [facility], [address] [place], [place] [address]  TSH     Status: None   Collection Time: 01/03/18  5:04 AM  Result Value Ref Range   TSH 0.645 0.350 - 4.500 uIU/mL    Comment: Performed by a 3rd Generation assay with a functional sensitivity of <=0.01 uIU/mL. Performed at [facility], [address] [place], [place] [address]   Reticulocytes     Status: Abnormal   Collection Time: 01/03/18  5:04 AM  Result Value Ref Range   Retic Ct Pct 1.5 0.4 - 3.1 %   RBC. 2.47 (L) 4.40 - 5.90 MIL/uL   Retic Count, Absolute 37.1 19.0 - 183.0 K/uL    Comment: Performed at [facility], [address] [place], [place] [address]  Vitamin B12     Status: None   Collection Time: 01/03/18  5:04 AM  Result Value Ref Range   Vitamin B-12 221 180 - 914 pg/mL    Comment: (NOTE) This assay is not validated for testing neonatal or myeloproliferative syndrome specimens for Vitamin B12 levels. Performed at [facility], [address] [place], [place] [address]   Folate     Status: Abnormal   Collection Time: 01/03/18  5:04 AM  Result Value Ref Range   Folate 5.7 (L) >5.9 ng/mL    Comment: Performed at [facility], [address] [place], [place] [address]  Type and screen [place]     Status: None   Collection Time: 01/04/18  4:48 AM  Result Value Ref Range   ABO/RH(D) A POS    Antibody Screen NEG    Sample Expiration      01/07/2018 Performed at [facility], [address] [place], [place] [address]    US Abdomen Complete  Result Date: 01/03/2018 CLINICAL DATA:  Anemia and thrombocytopenia. Question splenic sequestration. EXAM: ABDOMEN ULTRASOUND COMPLETE COMPARISON:  Abdominal ultrasound 09/27/2007 FINDINGS: Gallbladder: No gallstones or wall thickening visualized. No sonographic Murphy sign noted by sonographer. Common bile duct: Diameter: 0.4 cm Liver: No focal  lesion identified. Within normal limits in parenchymal echogenicity. Portal vein  is patent on color Doppler imaging with normal direction of blood flow towards the liver. IVC: No abnormality visualized. Pancreas: Visualized portion unremarkable. Spleen: Size and appearance within normal limits. Right Kidney: Length: 10.0 cm. Echogenicity within normal limits. No mass or hydronephrosis visualized. Left Kidney: Length: 11.9 cm. Echogenicity within normal limits. No mass or hydronephrosis visualized. Abdominal aorta: No aneurysm visualized. Other findings: None. IMPRESSION: Normal examination. In particular, the spleen is normal in size and appearance. Electronically Signed   By: Inge Rise M.D.   On: 01/03/2018 14:51    Assessment:  The patient is a 72 y.o. gentleman with a normocytic anemia and an upper GI bleed.  He has had mild chronic thrombocytopenia since 04/2017.  Iron stores appear to be adequate.  He has B12 deficiency and folate deficiency.  He drinks alcohol daily.  Upper endoscopy on 01/02/2018 revealed a normal esophagus, erythematous mucosa in the antrum, erythematous duodenopathy, and nodular mucosa in the duodenal bulb.  There was no ulcerations or active sites of bleeding.  Biopsies are pending.  Colonoscopy on 01/03/2018 revealed melena in the entire colon c/w bleeding from the proximal GI tract.  Small bowel enteroscopy on 01/03/2018 revealed no actively bleeding lesions.  The examined portion of the jejunum was normal.  The source of bleeding was felt to be in the more distal small bowel.  Capsule study is pending.  Abdominal ultrasound on 01/03/2018 revealed a normal spleen.  Symptomatically, he feels "ok".  He has ongoing melena.  Plan:   1.  Hematology:  Upper GI bleed without lesion identified to date on endoscopy.  Patient has had chronic mild thrombocytopenia predating admission.  Etiology likely secondary to alcohol intake and subsequent B12 and folate deficiency.   Platelet consumption with GI bleeding has resulted in a slight decrease in platelet count.  Maintain active type and screen.  Daily CBCs.  Begin folic acid 1 mg a day and B12 supplementation.   Additional labs draw today: hepatitis B and C serologies, ANA with reflex.  Consider HIV testing.  2.  Gastroenterology:  Patient on PPI BID.  Await capsule study results.  If brisk bleeding, tagged RBC scan or CTA to localize site of bleeding.  Follow-up upper endoscopy biopsies.   Thank you for allowing me to participate in Dwayne Thompson 's care.  I will follow him closely with you while hospitalized and after discharge in the outpatient department.  I will be out of the office beginning tomorrow.  Dr Janese Banks is covering for me.   Lequita Asal, MD  01/04/2018, 8:41 AM

## 2018-01-04 NOTE — Discharge Summary (Signed)
[name] NAME: Dwayne Thompson    MR#:  [id number]  DATE OF BIRTH:  Aug 15, 1946  DATE OF ADMISSION:  01/01/2018 ADMITTING PHYSICIAN: Hillary Bow, MD  DATE OF DISCHARGE: No discharge date for patient encounter.  PRIMARY CARE PHYSICIAN: Jerrol Banana., MD    ADMISSION DIAGNOSIS:  Symptomatic anemia [D64.9] Gastrointestinal hemorrhage, unspecified gastrointestinal hemorrhage type [K92.2]  DISCHARGE DIAGNOSIS:  Active Problems:   Melena   Duodenal nodule   Internal hemorrhoids   Acute gastrointestinal hemorrhage   Gastric irritation   SECONDARY DIAGNOSIS:   Past Medical History:  Diagnosis Date  . Back pain   . BPH (benign prostatic hyperplasia)   . ED (erectile dysfunction)     HOSPITAL COURSE:  *acute UGI bleedwith melena Resolved Exacerbated by NSAID/antiplatelet therapy Gastroenterology did see patient while in house-EGD noted for erythema/biopsies taken, colonoscopy noted for melena/no active bleeding source, capsule study done-results pending at time of discharge, hemoglobin stable at 8, Protonix 20 mg twice daily per gastroenterology, NSAIDs are to be avoided, anemia workup noted for B12 and folate deficiency-patient seen by hematology/oncology prior to discharge, appropriate follow-up with both gastroenterology and hematology status post discharge for continued care, and patient did well   *Hypertension Stable on current regiment  *Acute thrombocytopenia Most likely secondary to acute GI bleeding per hematology/oncology  Abdominal ultrasound was unimpressive  Anemia workup noted for B12 and folate deficiencies-repleted   *Chronic low back pain NSAIDs discontinued given GI bleeding  Patient started on Neurontin and scheduled Tylenol, and to follow-up with primary care provider for reevaluation in 5 days   DISCHARGE CONDITIONS:  On day of discharge patient is afebrile, hemodynamically stable,  tolerating diet, ready for discharge to home in care of family with appropriate follow-up, for more specific details please see chart  CONSULTS OBTAINED:  Treatment Team:  Virgel Manifold, MD Lequita Asal, MD  DRUG ALLERGIES:   Allergies  Allergen Reactions  . Tamsulosin Hcl Hives  . Sulfa Antibiotics Rash    DISCHARGE MEDICATIONS:   Allergies as of 01/04/2018      Reactions   Tamsulosin Hcl Hives   Sulfa Antibiotics Rash      Medication List    STOP taking these medications   aspirin 81 MG tablet Replaced by:  aspirin 81 MG EC tablet   IBU 800 MG tablet Generic drug:  ibuprofen     TAKE these medications   acetaminophen 325 MG tablet Commonly known as:  TYLENOL Take 2 tablets (650 mg total) by mouth 3 (three) times daily.   aspirin 81 MG EC tablet Take 1 tablet (81 mg total) by mouth daily. Replaces:  aspirin 81 MG tablet   Cholecalciferol 1000 units tablet Take 1,000 Units by mouth daily. Reported on 03/03/2016   cyanocobalamin 1000 MCG tablet Take 1 tablet (1,000 mcg total) by mouth daily. Start taking on:  01/05/2018   doxazosin 4 MG tablet Commonly known as:  CARDURA TAKE 1 TABLET DAILY   folic acid 1 MG tablet Commonly known as:  FOLVITE Take 1 tablet (1 mg total) by mouth daily. Start taking on:  01/05/2018   gabapentin 100 MG capsule Commonly known as:  NEURONTIN Take 1 capsule (100 mg total) by mouth 2 (two) times daily.   gabapentin 300 MG capsule Commonly known as:  NEURONTIN Take 1 capsule (300 mg total) by mouth at bedtime.   lisinopril 10 MG tablet Commonly known as:  PRINIVIL,ZESTRIL Take  1 tablet (10 mg total) by mouth daily.   pantoprazole sodium 40 mg/20 mL Pack Commonly known as:  PROTONIX 20mg  po bid   potassium chloride 10 MEQ tablet Commonly known as:  K-DUR Take 1 tablet (10 mEq total) by mouth 2 (two) times daily.   rosuvastatin 10 MG tablet Commonly known as:  CRESTOR Take 1 tablet (10 mg total) by mouth  daily.        DISCHARGE INSTRUCTIONS:  If you experience worsening of your admission symptoms, develop shortness of breath, life threatening emergency, suicidal or homicidal thoughts you must seek medical attention immediately by calling 911 or calling your MD immediately  if symptoms less severe.  You Must read complete instructions/literature along with all the possible adverse reactions/side effects for all the Medicines you take and that have been prescribed to you. Take any new Medicines after you have completely understood and accept all the possible adverse reactions/side effects.   Please note  You were cared for by a hospitalist during your hospital stay. If you have any questions about your discharge medications or the care you received while you were in the hospital after you are discharged, you can call the unit and asked to speak with the hospitalist on call if the hospitalist that took care of you is not available. Once you are discharged, your primary care physician will handle any further medical issues. Please note that NO REFILLS for any discharge medications will be authorized once you are discharged, as it is imperative that you return to your primary care physician (or establish a relationship with a primary care physician if you do not have one) for your aftercare needs so that they can reassess your need for medications and monitor your lab values.    Today   CHIEF COMPLAINT:   Chief Complaint  Patient presents with  . Shortness of Breath  . Dizziness    HISTORY OF PRESENT ILLNESS:   72 y.o. male with a known history of peptic ulcer disease diagnosed 30 years back, hypertension who takes baby aspirin and daily ibuprofen for back pain presents to the ER complaining of shortness of breath and dizziness for 3 days.  He has significant shortness of breath on ambulating and gets dizzy.  Had one episode of presyncope.  Due to this patient presented to the ER.  Here  patient complained of one episode of melena.  No bright red blood.  Stool positive for blood.  Hemoglobin 12.5.  Baseline hemoglobin last month was 15. Patient is being admitted for upper GI bleed, melena.  No chest pain. Patient has not had anything to eat or drink since 2 AM.  VITAL SIGNS:  Blood pressure 121/66, pulse 71, temperature 99 F (37.2 C), temperature source Oral, resp. rate 20, height 5\' 11"  (1.803 m), weight 93.9 kg (207 lb 0.2 oz), SpO2 99 %.  I/O:    Intake/Output Summary (Last 24 hours) at 01/04/2018 1023 Last data filed at 01/04/2018 0901 Gross per 24 hour  Intake 1176 ml  Output 600 ml  Net 576 ml    PHYSICAL EXAMINATION:  GENERAL:  72 y.o.-year-old patient lying in the bed with no acute distress.  EYES: Pupils equal, round, reactive to light and accommodation. No scleral icterus. Extraocular muscles intact.  HEENT: Head atraumatic, normocephalic. Oropharynx and nasopharynx clear.  NECK:  Supple, no jugular venous distention. No thyroid enlargement, no tenderness.  LUNGS: Normal breath sounds bilaterally, no wheezing, rales,rhonchi or crepitation. No use of accessory muscles of  respiration.  CARDIOVASCULAR: S1, S2 normal. No murmurs, rubs, or gallops.  ABDOMEN: Soft, non-tender, non-distended. Bowel sounds present. No organomegaly or mass.  EXTREMITIES: No pedal edema, cyanosis, or clubbing.  NEUROLOGIC: Cranial nerves II through XII are intact. Muscle strength 5/5 in all extremities. Sensation intact. Gait not checked.  PSYCHIATRIC: The patient is alert and oriented x 3.  SKIN: No obvious rash, lesion, or ulcer.   DATA REVIEW:   CBC Recent Labs  Lab 01/04/18 0457  WBC 5.7  HGB 8.2*  HCT 22.9*  PLT 86*    Chemistries  Recent Labs  Lab 01/01/18 1041 01/02/18 0519  NA 140 135  K 3.2* 3.4*  CL 107 109  CO2 25 22  GLUCOSE 119* 96  BUN 52* 23*  CREATININE 1.06 0.81  CALCIUM 9.1 8.2*  AST 26  --   ALT 17  --   ALKPHOS 41  --   BILITOT 0.7  --      Cardiac Enzymes Recent Labs  Lab 01/01/18 1041  TROPONINI <0.03    Microbiology Results  No results found for this or any previous visit.  RADIOLOGY:  US Abdomen Complete  Result Date: 01/03/2018 CLINICAL DATA:  Anemia and thrombocytopenia. Question splenic sequestration. EXAM: ABDOMEN ULTRASOUND COMPLETE COMPARISON:  Abdominal ultrasound 09/27/2007 FINDINGS: Gallbladder: No gallstones or wall thickening visualized. No sonographic Murphy sign noted by sonographer. Common bile duct: Diameter: 0.4 cm Liver: No focal lesion identified. Within normal limits in parenchymal echogenicity. Portal vein is patent on color Doppler imaging with normal direction of blood flow towards the liver. IVC: No abnormality visualized. Pancreas: Visualized portion unremarkable. Spleen: Size and appearance within normal limits. Right Kidney: Length: 10.0 cm. Echogenicity within normal limits. No mass or hydronephrosis visualized. Left Kidney: Length: 11.9 cm. Echogenicity within normal limits. No mass or hydronephrosis visualized. Abdominal aorta: No aneurysm visualized. Other findings: None. IMPRESSION: Normal examination. In particular, the spleen is normal in size and appearance. Electronically Signed   By: Inge Rise M.D.   On: 01/03/2018 14:51    EKG:   Orders placed or performed during the hospital encounter of 01/01/18  . ED EKG  . ED EKG  . EKG 12-Lead  . EKG 12-Lead      Management plans discussed with the patient, family and they are in agreement.  CODE STATUS:     Code Status Orders  (From admission, onward)        Start     Ordered   01/01/18 1204  Full code  Continuous     01/01/18 1205    Code Status History    This patient has a current code status but no historical code status.      TOTAL TIME TAKING CARE OF THIS PATIENT: 45 minutes.    Avel Peace Wendelin Bradt M.D on 01/04/2018 at 10:23 AM  Between 7am to 6pm - Pager - [phone]  After 6pm go to [email or web address] -  password [email or web address] [facility]  Office  [phone]  CC: Primary care physician; Jerrol Banana., MD   Note: This dictation was prepared with Dragon dictation along with smaller phrase technology. Any transcriptional errors that result from this process are unintentional.

## 2018-01-04 NOTE — Care Management Important Message (Signed)
Copy of signed IM left in patient's room.    

## 2018-01-05 ENCOUNTER — Telehealth: Payer: Self-pay

## 2018-01-05 LAB — HEPATITIS C ANTIBODY: HCV Ab: <0.1 {s_co_ratio} (ref 0.0–0.9)

## 2018-01-05 LAB — ANA W/REFLEX: Anti Nuclear Antibody(ANA): NEGATIVE

## 2018-01-05 LAB — HEPATITIS B SURFACE ANTIGEN: Hepatitis B Surface Ag: NEGATIVE

## 2018-01-05 LAB — HEPATITIS B CORE ANTIBODY, TOTAL: Hep B Core Total Ab: NEGATIVE

## 2018-01-05 NOTE — Telephone Encounter (Signed)
Received CRM stating pt wife, [name] had called and was requesting a returned call from me. At the wife's request, I returned her call. LVM requesting returned call.

## 2018-01-05 NOTE — Telephone Encounter (Signed)
Pt's wife [name] returned call and requested call back. Please advise. Thanks TNP

## 2018-01-05 NOTE — Telephone Encounter (Signed)
TOC #1. Called pt to f/u after d/c from [facility] on 01/04/18. Also wanted to confirm his hosp f/u appt w/ Dr. Rosanna Randy on 01/11/18 @ 4:20pm. Discharge planning includes the following:  - Start Tylenol, ASA, D57, Folic Acid, Neurontin and Protonix - Stop ibuprofen - Continue heart healthy diet - f/u with Dr. Rosanna Randy (01/11/18), Dr. Bonna Gains (01/23/18) and Dr. Mike Gip (01/10/18) As part of the [facility] f/u call, I am also wanting to discuss/review the above information with the pt to ensure all of the above have been taken care of and that he has no questions or concerns re: his care/discharge instructions. LVM requesting returned call.

## 2018-01-07 ENCOUNTER — Other Ambulatory Visit: Payer: Self-pay | Admitting: Gastroenterology

## 2018-01-07 LAB — METHYLMALONIC ACID, SERUM: Methylmalonic Acid, Quantitative: 120 nmol/L (ref 0–378)

## 2018-01-07 MED ORDER — AMOXICILLIN 500 MG PO TABS: 1000.0000 mg | ORAL_TABLET | Freq: Two times a day (BID) | ORAL | 0 refills | Status: DC

## 2018-01-07 MED ORDER — CLARITHROMYCIN 250 MG PO TABS: 500.0000 mg | ORAL_TABLET | Freq: Two times a day (BID) | ORAL | 0 refills | Status: AC

## 2018-01-08 ENCOUNTER — Telehealth: Payer: Self-pay

## 2018-01-08 NOTE — Telephone Encounter (Signed)
Transition Care Management Follow-Up Telephone Call   Date discharged and where: [facility] on 01/04/18.  How have you been since you were released from the hospital? Doing well, no current s/s.  Any patient concerns? None.   Items Reviewed:   Meds: verified  Allergies: verified  Dietary Changes Reviewed: N/A  Functional Questionnaire:  Independent-I Dependent-D  ADLs:   Dressing- I    Eating- I   Maintaining continence- I   Transferring- I   Transportation- I   Meal Prep- N/A   Managing Meds- I  Confirmed importance and Date/Time of follow-up visits scheduled: 01/11/18 @ 4:20 PM.   Confirmed with patient if condition worsens to call PCP or go to the Emergency Dept. Patient was given office number and encouraged to call back with questions or concerns: YES

## 2018-01-09 ENCOUNTER — Encounter: Payer: Self-pay | Admitting: Gastroenterology

## 2018-01-10 ENCOUNTER — Inpatient Hospital Stay: Payer: Medicare Other | Attending: Hematology and Oncology | Admitting: Hematology and Oncology

## 2018-01-10 ENCOUNTER — Encounter: Payer: Self-pay | Admitting: Hematology and Oncology

## 2018-01-10 ENCOUNTER — Telehealth: Payer: Self-pay

## 2018-01-10 ENCOUNTER — Inpatient Hospital Stay: Payer: Medicare Other

## 2018-01-10 ENCOUNTER — Other Ambulatory Visit: Payer: Self-pay

## 2018-01-10 VITALS — BP 97/59 | HR 82 | Temp 96.5°F | Wt 192.8 lb

## 2018-01-10 DIAGNOSIS — D519 Vitamin B12 deficiency anemia, unspecified: Secondary | ICD-10-CM | POA: Insufficient documentation

## 2018-01-10 DIAGNOSIS — R143 Flatulence: Secondary | ICD-10-CM | POA: Diagnosis not present

## 2018-01-10 DIAGNOSIS — B9681 Helicobacter pylori [H. pylori] as the cause of diseases classified elsewhere: Secondary | ICD-10-CM | POA: Diagnosis not present

## 2018-01-10 DIAGNOSIS — E538 Deficiency of other specified B group vitamins: Secondary | ICD-10-CM | POA: Diagnosis not present

## 2018-01-10 DIAGNOSIS — D529 Folate deficiency anemia, unspecified: Secondary | ICD-10-CM | POA: Diagnosis not present

## 2018-01-10 DIAGNOSIS — R634 Abnormal weight loss: Secondary | ICD-10-CM | POA: Diagnosis not present

## 2018-01-10 DIAGNOSIS — K2950 Unspecified chronic gastritis without bleeding: Secondary | ICD-10-CM

## 2018-01-10 DIAGNOSIS — R0609 Other forms of dyspnea: Secondary | ICD-10-CM | POA: Diagnosis not present

## 2018-01-10 DIAGNOSIS — Z79899 Other long term (current) drug therapy: Secondary | ICD-10-CM | POA: Diagnosis not present

## 2018-01-10 DIAGNOSIS — Z7982 Long term (current) use of aspirin: Secondary | ICD-10-CM | POA: Insufficient documentation

## 2018-01-10 DIAGNOSIS — D500 Iron deficiency anemia secondary to blood loss (chronic): Secondary | ICD-10-CM

## 2018-01-10 DIAGNOSIS — D696 Thrombocytopenia, unspecified: Secondary | ICD-10-CM | POA: Insufficient documentation

## 2018-01-10 DIAGNOSIS — Q2733 Arteriovenous malformation of digestive system vessel: Secondary | ICD-10-CM

## 2018-01-10 DIAGNOSIS — K922 Gastrointestinal hemorrhage, unspecified: Secondary | ICD-10-CM | POA: Insufficient documentation

## 2018-01-10 DIAGNOSIS — Z87891 Personal history of nicotine dependence: Secondary | ICD-10-CM | POA: Diagnosis not present

## 2018-01-10 DIAGNOSIS — N400 Enlarged prostate without lower urinary tract symptoms: Secondary | ICD-10-CM | POA: Diagnosis not present

## 2018-01-10 DIAGNOSIS — K5520 Angiodysplasia of colon without hemorrhage: Secondary | ICD-10-CM

## 2018-01-10 DIAGNOSIS — K2970 Gastritis, unspecified, without bleeding: Secondary | ICD-10-CM

## 2018-01-10 LAB — RETICULOCYTES
RBC.: 3.07 MIL/uL — ABNORMAL LOW (ref 4.40–5.90)
Retic Count, Absolute: 116.7 10*3/uL (ref 19.0–183.0)
Retic Ct Pct: 3.8 % — ABNORMAL HIGH (ref 0.4–3.1)

## 2018-01-10 LAB — IRON AND TIBC
Iron: 56 ug/dL (ref 45–182)
Saturation Ratios: 18 % (ref 17.9–39.5)
TIBC: 313 ug/dL (ref 250–450)
UIBC: 257 ug/dL

## 2018-01-10 LAB — CBC WITH DIFFERENTIAL/PLATELET
Basophils Absolute: 0 10*3/uL (ref 0–0.1)
Basophils Relative: 1 %
Eosinophils Absolute: 0.1 10*3/uL (ref 0–0.7)
Eosinophils Relative: 2 %
HCT: 29.3 % — ABNORMAL LOW (ref 40.0–52.0)
Hemoglobin: 10.2 g/dL — ABNORMAL LOW (ref 13.0–18.0)
Lymphocytes Relative: 17 %
Lymphs Abs: 0.7 10*3/uL — ABNORMAL LOW (ref 1.0–3.6)
MCH: 33 pg (ref 26.0–34.0)
MCHC: 35 g/dL (ref 32.0–36.0)
MCV: 94.2 fL (ref 80.0–100.0)
Monocytes Absolute: 0.7 10*3/uL (ref 0.2–1.0)
Monocytes Relative: 16 %
Neutro Abs: 2.8 10*3/uL (ref 1.4–6.5)
Neutrophils Relative %: 64 %
Platelets: 146 10*3/uL — ABNORMAL LOW (ref 150–440)
RBC: 3.11 MIL/uL — ABNORMAL LOW (ref 4.40–5.90)
RDW: 14.7 % — ABNORMAL HIGH (ref 11.5–14.5)
WBC: 4.4 10*3/uL (ref 3.8–10.6)

## 2018-01-10 LAB — FERRITIN: Ferritin: 231 ng/mL (ref 24–336)

## 2018-01-10 LAB — SEDIMENTATION RATE: Sed Rate: 26 mm/hr — ABNORMAL HIGH (ref 0–20)

## 2018-01-10 NOTE — Telephone Encounter (Signed)
Flagged on EMMI report for having unfilled prescriptions.  1st attempt to reach patient made 01/10/18 at 11:28am, however not available.  Left voicemail encouraging callback.  Will attempt at later time.

## 2018-01-10 NOTE — Progress Notes (Signed)
Patient here for follow up. Patient at hospital last week for "internal bleeding".

## 2018-01-10 NOTE — Progress Notes (Signed)
[facility] day:  01/10/2018  Chief Complaint: Dwayne Thompson is a 72 y.o. male with a recent upper GI bleed who is seen for assessment after interval hospitalization.  HPI:  The patient was admitted to [facility] from 01/01/2018 - 01/04/2018.  He presented with 3 days of dizziness and shortness of breath.  He noted melena x 2 on 12/31/2017.  ER evaluation revealed guaiac + stool.  CBC revealed a hematocrit of 34, hemoglobin 12.0, MCV 94.5, and platelets 118,000.  Prior CBC on 12/05/2017 revealed a hematocrit of 40.9 and hemoglobin 14.9.  Hemoglobin was followed: 9.5 on 01/01/2018, 8.6 on 01/02/2018, and 8.3 on 01/03/2018.  He has a history of chronic mild thrombocytopenia since 04/2017.  Platelet count has ranged between 101,000 - 134,000.  During his admission, platelet count decreased: 118,000 on 01/01/2018, 83,000 on 01/02/2018, and 84,000 on 01/03/2018.  Additional labs were notable for B12 and folate deficiency.  B12 was 221.  Folate was 5.7 on 01/03/2018.  TSH was normal.  Upper endoscopy by Dr. Bonna Gains on 01/02/2018 revealed a normal esophagus, erythematous mucosa in the antrum, erythematous duodenopathy, and nodular mucosa in the duodenal bulb.  There was no ulcerations or active sites of bleeding.  Duodenal bulb lesion revealed mild acute duodenitis, negative for dysplasia and malignancy.  Gastric biopsy revealed marked chronic active gastritis, Helicobacter pylori associated.  There was no dysplasia or malignancy. Antibiotics were called in to patient's pharmacy, however as of 01/10/2018, patient has not picked up the prescriptions citing that he was never informed.   Colonoscopy on 01/03/2018 by Dr. Bonna Gains revealed melena in the entire colon c/w bleeding from the proximal GI tract.  Small bowel enteroscopy on 01/03/2018 by Dr. Bonna Gains revealed no actively bleeding lesions.  The examined portion of the jejunum was normal.  The source of  bleeding was felt to be in the more distal small bowel.    Capsule study on 01/03/2018 revealed a red lesion in the first 7% of the study likely representing duodenal erythema and nodularity noted on recent EGD.  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  CBC on 01/04/2018 included a hematocrit of 22.9, hemoglobin 8.2, MCV 94.3, and platelets 86,000.  Regarding his diet, he eats meat daily.  He eats green leafy vegetables 2-3x/week.  He denies any pica.  He drinks alcohol daily.   He was discharged on Protonix BID, folic acid 1 mg a day, and B12 1000 mcg/day.  He is on aspirin 81 mg/day.  Since discharge, patient has not had a bowel movement.   He described being cleaned out during his hospitalization.  He denies abdominal pain. He is passing flatus.  Patient unable to tell he he has had any recurrent melena due to his bowel not moving.  He denies any hematochezia.  Stools prior to discharge were green in color.  Patient is doing well otherwise. He has no other complaints. Patient has no B symptoms.   Patient denies pain in the clinic today.    Past Medical History:  Diagnosis Date  . Back pain   . BPH (benign prostatic hyperplasia)   . ED (erectile dysfunction)     Past Surgical History:  Procedure Laterality Date  . COLONOSCOPY Left 01/03/2018   Procedure: COLONOSCOPY;  Surgeon: [name]  B, MD;  Location: [facility] ENDOSCOPY;  Service: Endoscopy;  Laterality: Left;  . ENTEROSCOPY  01/03/2018   Procedure: ENTEROSCOPY;  Surgeon: Virgel Manifold, MD;  Location: [facility] ENDOSCOPY;  Service: Endoscopy;;  . ESOPHAGOGASTRODUODENOSCOPY N/A 01/02/2018   Procedure: ESOPHAGOGASTRODUODENOSCOPY (EGD);  Surgeon: Virgel Manifold, MD;  Location: [facility] ENDOSCOPY;  Service: Endoscopy;  Laterality: N/A;  . HERNIA REPAIR      1980's    Family History  Problem Relation Age of Onset  . Alzheimer's disease Mother   . Sarcoidosis Sister   . Diabetes Brother   . Hypertension Brother   . Anxiety disorder Brother   . Kidney disease Neg Hx   . Prostate cancer Neg Hx     Social History:  reports that he quit smoking about 4 years ago. His smoking use included cigarettes. He has a 50.00 pack-year smoking history. He has never used smokeless tobacco. He reports that he drinks alcohol. He reports that he does not use drugs.  He drinks 2-3 alcohol shots/day.  The patient is accompanied by his wife, [name], and granddaughter, [name], today.  Allergies:  Allergies  Allergen Reactions  . Tamsulosin Hcl Hives  . Sulfa Antibiotics Rash    Current Medications: Current Outpatient Medications  Medication Sig Dispense Refill  . acetaminophen (TYLENOL) 325 MG tablet Take 2 tablets (650 mg total) by mouth 3 (three) times daily. (Patient taking differently: Take 650 mg by mouth every 6 (six) hours as needed. ) 180 tablet 0  . aspirin EC 81 MG EC tablet Take 1 tablet (81 mg total) by mouth daily. 180 tablet 0  . Cholecalciferol 1000 UNITS tablet Take 1,000 Units by mouth daily. Reported on 03/03/2016    . doxazosin (CARDURA) 4 MG tablet TAKE 1 TABLET DAILY 90 tablet 3  . folic acid (FOLVITE) 1 MG tablet Take 1 tablet (1 mg total) by mouth daily. 180 tablet 0  . lisinopril (PRINIVIL,ZESTRIL) 10 MG tablet Take 1 tablet (10 mg total) by mouth daily. 90 tablet 3  . pantoprazole sodium (PROTONIX) 40 mg/20 mL PACK 20mg  po bid 120 each 0  . potassium chloride (K-DUR) 10 MEQ tablet Take 1 tablet (10 mEq total) by mouth 2 (two) times daily. 60 tablet 3  . rosuvastatin (CRESTOR) 10 MG tablet Take 1 tablet (10 mg total) by mouth daily. 90 tablet 3  . vitamin B-12 1000 MCG tablet Take 1 tablet (1,000 mcg total) by mouth daily. 180 tablet 1  . clarithromycin (BIAXIN) 250 MG tablet Take 2 tablets (500 mg total) by mouth 2 (two) times daily for  14 days. (Patient not taking: Reported on 01/08/2018) 56 tablet 0  . gabapentin (NEURONTIN) 100 MG capsule Take 1 capsule (100 mg total) by mouth 2 (two) times daily. (Patient not taking: Reported on 01/08/2018) 90 capsule 2  . gabapentin (NEURONTIN) 300 MG capsule Take 1 capsule (300 mg total) by mouth at bedtime. (Patient not taking: Reported on 01/08/2018) 90 capsule 2   No current facility-administered medications for this visit.     Review of Systems:  GENERAL:  Feels "fine".  No fevers or sweats.  Weight loss of 15 pounds since admission. PERFORMANCE STATUS (ECOG):  1 HEENT:   No visual changes, runny nose, sore throat, mouth sores or tenderness. Lungs: Shortness of breath with exertion.  No cough.  No hemoptysis. Cardiac:  No chest pain, palpitations, orthopnea, or PND. GI:  No bowel movement since discharge.  Eating well. No nausea, vomiting, diarrhea, or hematochezia.  No recurrent  melena. GU:  No urgency, frequency, dysuria, or hematuria. Musculoskeletal:  No back pain.  No joint pain.  No muscle tenderness. Extremities:  No pain or swelling. Skin:  No rashes or skin changes. Neuro:  No headache, numbness or weakness, balance or coordination issues. Endocrine:  No diabetes, thyroid issues, hot flashes or night sweats. Psych:  No mood changes, depression or anxiety. Pain:  No focal pain. Review of systems:  All other systems reviewed today and found to be negative except as noted above.   Physical Exam: Blood pressure (!) 97/59, pulse 82, temperature (!) 96.5 F (35.8 C), temperature source Tympanic, weight 192 lb 12.8 oz (87.5 kg). GENERAL:  Well developed, well nourished, gentleman sitting comfortably in the exam room in no acute distress. MENTAL STATUS:  Alert and oriented to person, place and time. HEAD:  Black hair with mustache.  Male pattern baldness.  Normocephalic, atraumatic, face symmetric, no Cushingoid features. EYES:  Brown eyes.  Pupils equal round and reactive to  light and accomodation.  No conjunctivitis or scleral icterus. ENT:  Oropharynx clear without lesion.  Tongue normal.  Mucous membranes moist.  RESPIRATORY:  Clear to auscultation without rales, wheezes or rhonchi. CARDIOVASCULAR:  Regular rate and rhythm without murmur, rub or gallop. ABDOMEN:  Soft, non-tender, with active bowel sounds, and no hepatosplenomegaly.  No masses. SKIN:  No rashes, ulcers or lesions. EXTREMITIES: No edema, no skin discoloration or tenderness.  No palpable cords. LYMPH NODES: No palpable cervical, supraclavicular, axillary or inguinal adenopathy  NEUROLOGICAL: Unremarkable. PSYCH:  Appropriate. Physical exam:  Complete exam performed today as above with any/all changes from prior exam noted.     Office Visit on 01/10/2018  Component Date Value Ref Range Status  . Sed Rate 01/10/2018 26* 0 - 20 mm/hr Final   Performed at [facility], [address] [place], [place] [address]  . Retic Ct Pct 01/10/2018 PENDING  0.4 - 3.1 % Incomplete  . RBC. 01/10/2018 PENDING  4.22 - 5.81 MIL/uL Incomplete  . Retic Count, Absolute 01/10/2018 PENDING  19.0 - 186.0 K/uL Incomplete  . WBC 01/10/2018 4.4  3.8 - 10.6 K/uL Final  . RBC 01/10/2018 3.11* 4.40 - 5.90 MIL/uL Final  . Hemoglobin 01/10/2018 10.2* 13.0 - 18.0 g/dL Final  . HCT 01/10/2018 29.3* 40.0 - 52.0 % Final  . MCV 01/10/2018 94.2  80.0 - 100.0 fL Final  . MCH 01/10/2018 33.0  26.0 - 34.0 pg Final  . MCHC 01/10/2018 35.0  32.0 - 36.0 g/dL Final  . RDW 01/10/2018 14.7* 11.5 - 14.5 % Final  . Platelets 01/10/2018 146* 150 - 440 K/uL Final  . Neutrophils Relative % 01/10/2018 64  % Final  . Neutro Abs 01/10/2018 2.8  1.4 - 6.5 K/uL Final  . Lymphocytes Relative 01/10/2018 17  % Final  . Lymphs Abs 01/10/2018 0.7* 1.0 - 3.6 K/uL Final  . Monocytes Relative 01/10/2018 16  % Final  . Monocytes Absolute 01/10/2018 0.7  0.2 - 1.0 K/uL Final  . Eosinophils Relative 01/10/2018 2  % Final  . Eosinophils  Absolute 01/10/2018 0.1  0 - 0.7 K/uL Final  . Basophils Relative 01/10/2018 1  % Final  . Basophils Absolute 01/10/2018 0.0  0 - 0.1 K/uL Final   Performed at [facility], [address] [place], [place] [address]    Assessment:  Dwayne Thompson is a 72 y.o. 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  He has had mild chronic thrombocytopenia since 04/2017.  Platelet count has ranged between 101,000 - 134,000.  During his active GI bleed platelet nadir was 83,000.  He has B12 deficiency and folate deficiency.  B12 was 221 and folate 5.7 on 01/03/2018.  He is on oral Z61 and folic acid.  Diet is good.  He drinks alcohol daily.  Anemia and thrombocytopenia work-up on 01/04/2018 revealed the following normal studies: hepatitis B surface antigen, hepatitis B core antibody total, hepatitis C antibody, and ANA.  Ferritin was 272.  Iron saturation was 34% with a TIBC of 265.  Abdominal ultrasound on 01/03/2018 revealed a normal spleen.  Upper endoscopy on 01/02/2018 revealed a normal esophagus, erythematous mucosa in the antrum, erythematous duodenopathy, and nodular mucosa in the duodenal bulb.  There was no ulcerations or active sites of bleeding.  Pathology revealed mild acute duodenitis, negative for dysplasia and malignancy.  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  There was no dysplasia or malignancy.  Colonoscopy on 01/03/2018 revealed melena in the entire colon c/w bleeding from the proximal GI tract.  Small bowel enteroscopy on 01/03/2018 revealed no actively bleeding lesions.  The examined portion of the jejunum was normal.    Capsule study on 01/03/2018 revealed a red lesion in the first 7% of the study likely representing duodenal erythema and nodularity noted on recent EGD.  There was a red spot at 78% with subsequent small red blood strands which could represent an oozing AVM in the distal  small bowel.  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  Symptomatically, he feels "ok".  He has not had a bowel movement since discharge.  He denies any melena.  Exam is unremarkable.   Plan:   1.  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 2.  Discuss diagnosis of H pylori gastritis and treatment.  Patient has not picked up prescribed ABX.  3.  Discuss continuation of oral B12 and folate for documented B12 deficiency and folate deficiency.  Discuss follow-up levels in 1 month.  Discuss potential B12 injections if levels do not improve.  Discuss increasing oral folate if does not improve. 4.  Follow-up with GI.  Anticipate repeat colonoscopy in future as obscured from melena. 5.  Labs today:  CBC with diff, retic, ferritin, iron studies, sed rate, intrinsic factor antibodies, anti-parietal antibodies, HIV testing. Patient provides verbal consent for HIV testing.  6.  Discuss alcohol intake. Patient encouraged to reduce the amount he is drinking. 7.  RTC in 1 month for MD assessment, labs (CBC with diff, ferritin, B12, folate).   Honor Loh, NP 01/10/2018, 2:49 PM   I saw and evaluated the patient, participating in the key portions of the service and reviewing pertinent diagnostic studies and records.  I reviewed the nurse practitioner's note and agree with the findings and the plan.  The assessment and plan were discussed with the patient.  Multiple questions were asked by the patient and answered.   Nolon Stalls, MD 01/10/2018,2:49 PM

## 2018-01-11 ENCOUNTER — Ambulatory Visit (INDEPENDENT_AMBULATORY_CARE_PROVIDER_SITE_OTHER): Payer: Medicare Other | Admitting: Family Medicine

## 2018-01-11 ENCOUNTER — Encounter: Payer: Self-pay | Admitting: Family Medicine

## 2018-01-11 VITALS — BP 116/52 | HR 74 | Temp 99.0°F | Resp 16 | Wt 194.0 lb

## 2018-01-11 DIAGNOSIS — R059 Cough, unspecified: Secondary | ICD-10-CM

## 2018-01-11 DIAGNOSIS — R05 Cough: Secondary | ICD-10-CM | POA: Diagnosis not present

## 2018-01-11 DIAGNOSIS — Z09 Encounter for follow-up examination after completed treatment for conditions other than malignant neoplasm: Secondary | ICD-10-CM | POA: Diagnosis not present

## 2018-01-11 LAB — ANTI-PARIETAL ANTIBODY: Parietal Cell Antibody-IgG: 2.3 Units (ref 0.0–20.0)

## 2018-01-11 LAB — HIV ANTIBODY (ROUTINE TESTING W REFLEX): HIV Screen 4th Generation wRfx: NONREACTIVE

## 2018-01-11 LAB — INTRINSIC FACTOR ANTIBODIES: Intrinsic Factor: 1 AU/mL (ref 0.0–1.1)

## 2018-01-11 NOTE — Patient Instructions (Signed)
Try Colace and Glycolax daily. Try Mucinex for cough.

## 2018-01-11 NOTE — Progress Notes (Signed)
Patient: Dwayne Thompson Male    DOB: September 04, 1946   72 y.o.   MRN: [id number] Visit Date: 01/11/2018  Today's Provider: [name], MD   Chief Complaint  Patient presents with  . Hospitalization Follow-up   Subjective:    HPI Hospital stay 4/15-4/18  Symptomatic anemia, GI hemorrhage, unspecified type.  4/16 EGD_ gastric erythema, duodenal erythema ( await pathology) 4/17 Colonoscopy- non bleeding hemorrhoids. Melena but no red blood  Pathology Mild acute duodenitis negative for dysplasia and malignancy multiple deeper levels examined Stomach marked chromic active gastris, Hy pylori associated negative for dysplasia and malignancy.    Pt reports that he is feeling ok, except that he has not had a bowel movement since he has been home from the hospital. He has tried some remedies with no BM. He does not feel like he has to go or does not feel any bloating etc. GI has sent in treatment for H pylori but pt did not know it was there. Pt would like to discuss taking gabapentin that he was prescribe in the hospital. He has a cough that was at first dry and has been going on for a couple of days.       Allergies  Allergen Reactions  . Tamsulosin Hcl Hives  . Sulfa Antibiotics Rash     Current Outpatient Medications:  .  acetaminophen (TYLENOL) 325 MG tablet, Take 2 tablets (650 mg total) by mouth 3 (three) times daily. (Patient taking differently: Take 650 mg by mouth every 6 (six) hours as needed. ), Disp: 180 tablet, Rfl: 0 .  aspirin EC 81 MG EC tablet, Take 1 tablet (81 mg total) by mouth daily., Disp: 180 tablet, Rfl: 0 .  Cholecalciferol 1000 UNITS tablet, Take 1,000 Units by mouth daily. Reported on 03/03/2016, Disp: , Rfl:  .  doxazosin (CARDURA) 4 MG tablet, TAKE 1 TABLET DAILY, Disp: 90 tablet, Rfl: 3 .  folic acid (FOLVITE) 1 MG tablet, Take 1 tablet (1 mg total) by mouth daily., Disp: 180 tablet, Rfl: 0 .  lisinopril (PRINIVIL,ZESTRIL) 10 MG tablet, Take 1  tablet (10 mg total) by mouth daily., Disp: 90 tablet, Rfl: 3 .  pantoprazole sodium (PROTONIX) 40 mg/20 mL PACK, 20mg  po bid, Disp: 120 each, Rfl: 0 .  potassium chloride (K-DUR) 10 MEQ tablet, Take 1 tablet (10 mEq total) by mouth 2 (two) times daily., Disp: 60 tablet, Rfl: 3 .  rosuvastatin (CRESTOR) 10 MG tablet, Take 1 tablet (10 mg total) by mouth daily., Disp: 90 tablet, Rfl: 3 .  vitamin B-12 1000 MCG tablet, Take 1 tablet (1,000 mcg total) by mouth daily., Disp: 180 tablet, Rfl: 1 .  amoxicillin (AMOXIL) 500 MG tablet, Take 500 mg by mouth 2 (two) times daily., Disp: , Rfl:  .  clarithromycin (BIAXIN) 250 MG tablet, Take 2 tablets (500 mg total) by mouth 2 (two) times daily for 14 days. (Patient not taking: Reported on 01/11/2018), Disp: 56 tablet, Rfl: 0 .  gabapentin (NEURONTIN) 100 MG capsule, Take 1 capsule (100 mg total) by mouth 2 (two) times daily. (Patient not taking: Reported on 01/08/2018), Disp: 90 capsule, Rfl: 2 .  gabapentin (NEURONTIN) 300 MG capsule, Take 1 capsule (300 mg total) by mouth at bedtime. (Patient not taking: Reported on 01/08/2018), Disp: 90 capsule, Rfl: 2  Review of Systems  Constitutional: Positive for fatigue.  HENT: Negative.   Eyes: Negative.   Respiratory: Positive for cough and shortness of breath.  Cardiovascular: Negative.   Gastrointestinal: Positive for constipation.  Endocrine: Negative.   Genitourinary: Negative.   Musculoskeletal: Negative.   Skin: Negative.   Allergic/Immunologic: Negative.   Neurological: Negative.   Hematological: Negative.   Psychiatric/Behavioral: Negative.     Social History   Tobacco Use  . Smoking status: Former Smoker    Packs/day: 1.00    Years: 50.00    Pack years: 50.00    Types: Cigarettes    Last attempt to quit: 10/20/2013    Years since quitting: 4.2  . Smokeless tobacco: Never Used  Substance Use Topics  . Alcohol use: Yes    Comment: daily, 3 to 4 drinks daily, usually gin and chases it down  with soda   Objective:   BP (!) 116/52 (BP Location: Left Arm, Patient Position: Sitting, Cuff Size: Large)   Pulse 74   Temp 99 F (37.2 C) (Oral)   Resp 16   Wt 194 lb (88 kg)   BMI 27.06 kg/m  Vitals:   01/11/18 1618  BP: (!) 116/52  Pulse: 74  Resp: 16  Temp: 99 F (37.2 C)  TempSrc: Oral  Weight: 194 lb (88 kg)     Physical Exam  Constitutional: He is oriented to person, place, and time. He appears well-developed and well-nourished.  HENT:  Head: Normocephalic and atraumatic.  Eyes: No scleral icterus.  Neck: No thyromegaly present.  Cardiovascular: Normal rate, regular rhythm and normal heart sounds.  Pulmonary/Chest: Effort normal and breath sounds normal.  Abdominal: He exhibits distension.  Musculoskeletal: He exhibits no edema.  Lymphadenopathy:    He has no cervical adenopathy.  Neurological: He is alert and oriented to person, place, and time.  Skin: Skin is warm and dry.  Psychiatric: He has a normal mood and affect. His behavior is normal. Judgment and thought content normal.        Assessment & Plan:     1. Hospital discharge follow-up Unspecified GI bleed.No NSAIDs.  2. Cough 3.HTN 4.HLD        [name], MD  [facility]

## 2018-01-11 NOTE — Telephone Encounter (Signed)
Reached upon 2nd attempt on 01/11/18 at 2:14pm.  Patient reports he was able to pick up all of his medications he discharged home on.  No further questions or concerns at this time.   I thanked him for his time.

## 2018-01-15 ENCOUNTER — Telehealth: Payer: Self-pay

## 2018-01-15 NOTE — Telephone Encounter (Signed)
LMTCB and r/s cancelled AWV from today. -MM

## 2018-01-23 ENCOUNTER — Ambulatory Visit: Payer: Medicare Other | Admitting: Gastroenterology

## 2018-01-23 ENCOUNTER — Other Ambulatory Visit: Payer: Self-pay

## 2018-01-23 ENCOUNTER — Ambulatory Visit (INDEPENDENT_AMBULATORY_CARE_PROVIDER_SITE_OTHER): Payer: Medicare Other | Admitting: Gastroenterology

## 2018-01-23 ENCOUNTER — Encounter: Payer: Self-pay | Admitting: Gastroenterology

## 2018-01-23 VITALS — BP 104/61 | HR 80 | Temp 98.1°F | Ht 71.5 in | Wt 189.4 lb

## 2018-01-23 DIAGNOSIS — A048 Other specified bacterial intestinal infections: Secondary | ICD-10-CM | POA: Diagnosis not present

## 2018-01-23 DIAGNOSIS — K921 Melena: Secondary | ICD-10-CM | POA: Diagnosis not present

## 2018-01-23 NOTE — Progress Notes (Signed)
Dwayne Antigua, MD [address]  [place]  [place], [place] [address]  Main: [phone]  Fax: [phone]   Primary Care Physician: Jerrol Banana., MD  Primary Gastroenterologist:  Dr. Vonda Thompson  Chief Complaint  Patient presents with  . Establish Care    f/u GI bleed    HPI: Dwayne Thompson is a 72 y.o. male here for follow-up posthospitalization.  Patient was seen for hospitalization consult on April 15 due to melena.  He also had history of ibuprofen use prior to the hospitalization.  He underwent extensive work-up.  EGD on April 16 showed erythematous mucosa in the antrum, duodenum.  Nodular erythematous mucosa in the duodenum was biopsied and clipped.  No active bleeding was seen during the EGD.  Colonoscopy on April 17 showed melena but no red blood.  No actively bleeding lesions.  This was consistent with a more proximal GI source of bleeding.  He did underwent push enteroscopy that showed same findings as a previous EGD, and no actively bleeding lesions.  Normal duodenum.  Capsule endoscopy was performed and did not identify specific source of bleeding.  However, small nonspecific red blood strand was seen in the small bowel, which could represent an oozing AVM (however, no AVMs were identified on the pill camera itself).  Gastric biopsies showed H. pylori, and he is currently undergoing triple therapy, and has about 3 to 4 days left.  He is avoiding NSAIDs that he was taking prior to the hospital admission.  Patient has not had any further melena since discharge.  He has only had 2-3 bowel movement since discharge.  And they have been brown.  Last bowel movements was about 5 to 7 days ago.  He denies any abdominal pain.  No nausea vomiting.  No hematemesis.  No blood in stool or melena.  No weight loss.  Good appetite.  No dysphagia.  No heartburn.  Last hemoglobin was on January 10, 2018, and had improved to 10.2 from 8.2 since hospital discharge.  Patient  reports improved energy level.  Ferritin and iron work-up was normal.  Current Outpatient Medications  Medication Sig Dispense Refill  . acetaminophen (TYLENOL) 325 MG tablet Take 2 tablets (650 mg total) by mouth 3 (three) times daily. (Patient taking differently: Take 650 mg by mouth every 6 (six) hours as needed. ) 180 tablet 0  . amoxicillin (AMOXIL) 500 MG tablet Take 500 mg by mouth 2 (two) times daily.    Marland Kitchen aspirin EC 81 MG EC tablet Take 1 tablet (81 mg total) by mouth daily. 180 tablet 0  . Cholecalciferol 1000 UNITS tablet Take 1,000 Units by mouth daily. Reported on 03/03/2016    . clarithromycin (BIAXIN) 250 MG tablet Take 250 mg by mouth 2 (two) times daily.    Marland Kitchen doxazosin (CARDURA) 4 MG tablet TAKE 1 TABLET DAILY 90 tablet 3  . folic acid (FOLVITE) 1 MG tablet Take 1 tablet (1 mg total) by mouth daily. 180 tablet 0  . lisinopril (PRINIVIL,ZESTRIL) 10 MG tablet Take 1 tablet (10 mg total) by mouth daily. 90 tablet 3  . pantoprazole sodium (PROTONIX) 40 mg/20 mL PACK 20mg  po bid 120 each 0  . potassium chloride (K-DUR) 10 MEQ tablet Take 1 tablet (10 mEq total) by mouth 2 (two) times daily. 60 tablet 3  . rosuvastatin (CRESTOR) 10 MG tablet Take 1 tablet (10 mg total) by mouth daily. 90 tablet 3  . vitamin B-12 1000 MCG tablet Take 1  tablet (1,000 mcg total) by mouth daily. 180 tablet 1  . gabapentin (NEURONTIN) 100 MG capsule Take 1 capsule (100 mg total) by mouth 2 (two) times daily. (Patient not taking: Reported on 01/08/2018) 90 capsule 2  . gabapentin (NEURONTIN) 300 MG capsule Take 1 capsule (300 mg total) by mouth at bedtime. (Patient not taking: Reported on 01/08/2018) 90 capsule 2   No current facility-administered medications for this visit.     Allergies as of 01/23/2018 - Review Complete 01/23/2018  Allergen Reaction Noted  . Tamsulosin hcl Hives 03/28/2015  . Sulfa antibiotics Rash 01/02/2018    ROS:  General: Negative for anorexia, weight loss, fever, chills,  fatigue, weakness. ENT: Negative for hoarseness, difficulty swallowing , nasal congestion. CV: Negative for chest pain, angina, palpitations, dyspnea on exertion, peripheral edema.  Respiratory: Negative for dyspnea at rest, dyspnea on exertion, cough, sputum, wheezing.  GI: See history of present illness. GU:  Negative for dysuria, hematuria, urinary incontinence, urinary frequency, nocturnal urination.  Endo: Negative for unusual weight change.    Physical Examination:   BP 104/61   Pulse 80   Temp 98.1 F (36.7 C)   Ht 5' 11.5" (1.816 m)   Wt 189 lb 6.4 oz (85.9 kg)   BMI 26.05 kg/m   General: Well-nourished, well-developed in no acute distress.  Eyes: No icterus. Conjunctivae pink. Mouth: Oropharyngeal mucosa moist and pink , no lesions erythema or exudate. Neck: Supple, Trachea midline Abdomen: Bowel sounds are normal, nontender, nondistended, no hepatosplenomegaly or masses, no abdominal bruits or hernia , no rebound or guarding.   Extremities: No lower extremity edema. No clubbing or deformities. Neuro: Alert and oriented x 3.  Grossly intact. Skin: Warm and dry, no jaundice.   Psych: Alert and cooperative, normal mood and affect.   Labs: CMP     Component Value Date/Time   NA 135 01/02/2018 0519   NA 143 12/05/2017 0831   K 3.4 (L) 01/02/2018 0519   CL 109 01/02/2018 0519   CO2 22 01/02/2018 0519   GLUCOSE 96 01/02/2018 0519   BUN 23 (H) 01/02/2018 0519   BUN 10 12/05/2017 0831   CREATININE 0.81 01/02/2018 0519   CALCIUM 8.2 (L) 01/02/2018 0519   PROT 7.1 01/01/2018 1041   PROT 7.1 12/05/2017 0831   ALBUMIN 4.1 01/01/2018 1041   ALBUMIN 4.3 12/05/2017 0831   AST 26 01/01/2018 1041   ALT 17 01/01/2018 1041   ALKPHOS 41 01/01/2018 1041   BILITOT 0.7 01/01/2018 1041   BILITOT 0.6 12/05/2017 0831   GFRNONAA >60 01/02/2018 0519   GFRAA >60 01/02/2018 0519   Lab Results  Component Value Date   WBC 4.4 01/10/2018   HGB 10.2 (L) 01/10/2018   HCT 29.3 (L)  01/10/2018   MCV 94.2 01/10/2018   PLT 146 (L) 01/10/2018    Imaging Studies: Dg Chest 1 View  Result Date: 01/01/2018 CLINICAL DATA:  Shortness of breath, dizziness, and diaphoresis. Former smoker. EXAM: CHEST  1 VIEW COMPARISON:  Chest CT 09/01/2017 and radiographs 05/15/2017 FINDINGS: The cardiomediastinal silhouette is within normal limits. There is evidence of underlying emphysema. The patient has taken a slightly shallower inspiration than on the prior radiographs, and there is minimal heterogeneous opacity in both lung bases. No pleural effusion or pneumothorax is identified. No acute osseous abnormality is seen. IMPRESSION: Minimal bibasilar opacity which may reflect atelectasis or possibly early infection. Electronically Signed   By: Logan Bores M.D.   On: 01/01/2018 11:22   US Abdomen  Complete  Result Date: 01/03/2018 CLINICAL DATA:  Anemia and thrombocytopenia. Question splenic sequestration. EXAM: ABDOMEN ULTRASOUND COMPLETE COMPARISON:  Abdominal ultrasound 09/27/2007 FINDINGS: Gallbladder: No gallstones or wall thickening visualized. No sonographic Murphy sign noted by sonographer. Common bile duct: Diameter: 0.4 cm Liver: No focal lesion identified. Within normal limits in parenchymal echogenicity. Portal vein is patent on color Doppler imaging with normal direction of blood flow towards the liver. IVC: No abnormality visualized. Pancreas: Visualized portion unremarkable. Spleen: Size and appearance within normal limits. Right Kidney: Length: 10.0 cm. Echogenicity within normal limits. No mass or hydronephrosis visualized. Left Kidney: Length: 11.9 cm. Echogenicity within normal limits. No mass or hydronephrosis visualized. Abdominal aorta: No aneurysm visualized. Other findings: None. IMPRESSION: Normal examination. In particular, the spleen is normal in size and appearance. Electronically Signed   By: Inge Rise M.D.   On: 01/03/2018 14:51    Assessment and Plan:   Dwayne Thompson is a 72 y.o. y/o male here for hospital follow-up for melena  Source of melena but identified, as detailed in HPI Source is likely small bowel.  Possible small bowel AVM versus small small bowel ulcers from ibuprofen use Melena has completely resolved Hemoglobin has improved since discharge This is consistent with resolved GI bleed  At this point, referral for double balloon enteroscopy is not likely to be beneficial.  As specific location and source of bleed is not known, and since there is no active bleeding, double-balloon procedure is unlikely to find source of bleeding at this time.  And would put patient through a long procedure without much benefit.  This was discussed with the patient and family in detail and were they verbalized understanding.  However, if he has any changes in symptoms, including melena, hematochezia, weakness, worsening shortness of breath, altered bowel habits, abdominal pain, nausea or vomiting, or any other reason for concern, he is to notify us immediately or go to the ER.  He knows not to take any further NSAIDs and this was discussed with him again  He will complete his H. pylori treatment, and we will follow-up with him in clinic, and perform eradication testing at that time Repeat hemoglobin can also be done at that time, and is improving  Dr Dwayne Thompson

## 2018-01-23 NOTE — Patient Instructions (Signed)
F/u 4-6  Helicobacter Pylori Infection Helicobacter pylori infection is an infection in the stomach that is caused by the Helicobacter pylori (H. pylori) bacteria. This type of bacteria often lives in the lining of the stomach. The infection can cause ulcers and irritation (gastritis) in some people. It is the most common cause of ulcers in the stomach (gastric ulcer) and in the upper part of the intestine (duodenal ulcer). Having this infection may also increase the risk of stomach cancer and a type of white blood cell cancer (lymphoma) that affects the stomach. What are the causes? H. pylori is a type of bacteria that is often found in the stomachs of healthy people. The bacteria may be passed from person to person through contact with stool or saliva. It is not known why some people develop ulcers, gastritis, or cancer from the infection. What increases the risk? This condition is more likely to develop in people who:  Have family members with the infection.  Live with many other people, such as in a dormitory.  Are of African, Hispanic, or Asian descent.  What are the signs or symptoms? Most people with this infection do not have symptoms. If you do have symptoms, they may include:  Heartburn.  Stomach pain.  Nausea.  Vomiting.  Blood-tinged vomit.  Loss of appetite.  Bad breath.  How is this diagnosed? This condition may be diagnosed based on your symptoms, a physical exam, and various tests. Tests may include:  Blood tests or stool tests to check for the proteins (antibodies) that your body may produce in response to the bacteria. These tests are the best way to confirm the diagnosis.  A breath test to check for the type of gas that the H. pylori bacteria release after breaking down a substance called urea. For the test, you are asked to drink urea. This test is often done after treatment in order to find out if the treatment worked.  A procedure in which a thin, flexible  tube with a tiny camera at the end is placed into your stomach and upper intestine (upper endoscopy). Your health care provider may also take tissue samples (biopsy) to test for H. pylori and cancer.  How is this treated? Treatment for this condition usually involves taking a combination of medicines (triple therapy) for a couple of weeks. Triple therapy includes one medicine to reduce the acid in your stomach and two types of antibiotic medicines. Many drug combinations have been approved for treatment. Treatment usually kills the H. pylori and reduces your risk of cancer. You may need to be tested for H. pylori again after treatment. In some cases, the treatment may need to be repeated. Follow these instructions at home:  Take over-the-counter and prescription medicines only as told by your health care provider.  Take your antibiotics as told by your health care provider. Do not stop taking the antibiotics even if you start to feel better.  You can do all your usual activities and eat what you usually do.  Take steps to prevent future infections: ? Wash your hands often. ? Make sure the food you eat has been properly prepared. ? Drink water only from clean sources.  Keep all follow-up visits as told by your health care provider. This is important. Contact a health care provider if:  Your symptoms do not get better.  Your symptoms return after treatment. This information is not intended to replace advice given to you by your health care provider. Make sure you  discuss any questions you have with your health care provider. Document Released: 12/28/2015 Document Revised: 02/11/2016 Document Reviewed: 09/17/2014 Elsevier Interactive Patient Education  2018 [employer]. wks

## 2018-01-25 NOTE — Telephone Encounter (Signed)
Spoke with pt who states Dr Rosanna Randy told him they could schedule the wellness after his f/u apt on 02/07/18. Left note on appt. -MM

## 2018-02-07 ENCOUNTER — Ambulatory Visit (INDEPENDENT_AMBULATORY_CARE_PROVIDER_SITE_OTHER): Payer: Medicare Other | Admitting: Family Medicine

## 2018-02-07 VITALS — BP 106/68 | HR 70 | Temp 98.1°F | Resp 16 | Wt 189.0 lb

## 2018-02-07 DIAGNOSIS — D696 Thrombocytopenia, unspecified: Secondary | ICD-10-CM | POA: Diagnosis not present

## 2018-02-07 DIAGNOSIS — K922 Gastrointestinal hemorrhage, unspecified: Secondary | ICD-10-CM

## 2018-02-07 DIAGNOSIS — D519 Vitamin B12 deficiency anemia, unspecified: Secondary | ICD-10-CM

## 2018-02-07 DIAGNOSIS — I10 Essential (primary) hypertension: Secondary | ICD-10-CM

## 2018-02-07 DIAGNOSIS — B354 Tinea corporis: Secondary | ICD-10-CM | POA: Diagnosis not present

## 2018-02-07 MED ORDER — CLOTRIMAZOLE-BETAMETHASONE 1-0.05 % EX CREA: 1.0000 "application " | TOPICAL_CREAM | Freq: Two times a day (BID) | CUTANEOUS | 0 refills | Status: DC

## 2018-02-07 MED ORDER — PANTOPRAZOLE SODIUM 40 MG PO PACK: PACK | ORAL | 0 refills | Status: DC

## 2018-02-07 MED ORDER — LISINOPRIL 10 MG PO TABS: 10.0000 mg | ORAL_TABLET | Freq: Every day | ORAL | 3 refills | Status: DC

## 2018-02-07 NOTE — Progress Notes (Signed)
Dwayne Thompson  MRN: [id number] DOB: 1946/02/21  Subjective:  HPI   The patient is a 73 year old male who presents for follow up from where he had been in the hospital.  The patient was seen for GI bleed.  He brings in with him a list he made of his medications.  He and his wife don't agree on some of what he thinks he is taking but they are going to check when they go home.  The patient does not think he is taking the Protonix anymore.  He said he did not have any more refills on it and we need to clarify if he should be taking that medication itself.No GI symptoms. Slight rash on upper thigh.  Patient Active Problem List   Diagnosis Date Noted  . Anemia due to vitamin B12 deficiency 01/10/2018  . Anemia due to folic acid deficiency 33/29/5188  . 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 Helicobacter pylori gastritis 01/10/2018  . Thrombocytopenia ([place]) 01/10/2018  . Internal hemorrhoids   . Acute gastrointestinal hemorrhage   . Gastric irritation   . Duodenal nodule   . Melena 01/01/2018  . Neck pain on right side 03/23/2016  . History of tobacco abuse 03/23/2016  . Cervical lymphadenitis 03/23/2016  . 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 Allergic rhinitis 03/28/2015  . 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 Failure of erection 03/28/2015  . Acid reflux 03/28/2015  . Arthritis, degenerative 03/28/2015  . Change in blood platelet count 03/28/2015  . Temporary cerebral vascular dysfunction 03/28/2015  . Personal history of tobacco use, presenting hazards to health 03/28/2015    Past Medical History:  Diagnosis Date  . Back pain   . BPH (benign prostatic hyperplasia)   . ED (erectile dysfunction)     Social History   Socioeconomic History  . Marital status: Married    Spouse name: [name]  . Number of  children: 3  . Years of education: 40  . Highest education level: Not on file  Occupational History  . Occupation: retired  Scientific laboratory technician  . Financial resource strain: Not on file  . Food insecurity:    Worry: Not on file    Inability: Not on file  . Transportation needs:    Medical: Not on file    Non-medical: Not on file  Tobacco Use  . Smoking status: Former Smoker    Packs/day: 1.00    Years: 50.00    Pack years: 50.00    Types: Cigarettes    Last attempt to quit: 10/20/2013    Years since quitting: 4.3  . Smokeless tobacco: Never Used  Substance and Sexual Activity  . Alcohol use: Yes    Comment: daily, 3 to 4 drinks daily, usually gin and chases it down with soda  . Drug use: No  . Sexual activity: Not Currently  Lifestyle  . Physical activity:    Days per week: Not on file    Minutes per session: Not on file  . Stress: Not on file  Relationships  . Social connections:    Talks on phone: Not on file    Gets together: Not on file    Attends religious service: Not on file    Active member of club or organization: Not on file    Attends meetings of clubs or organizations: Not on file  Relationship status: Not on file  . Intimate partner violence:    Fear of current or ex partner: Not on file    Emotionally abused: Not on file    Physically abused: Not on file    Forced sexual activity: Not on file  Other Topics Concern  . Not on file  Social History Narrative  . Not on file    Outpatient Encounter Medications as of 02/07/2018  Medication Sig  . aspirin EC 81 MG EC tablet Take 1 tablet (81 mg total) by mouth daily.  . Cholecalciferol 1000 UNITS tablet Take 1,000 Units by mouth daily. Reported on 03/03/2016  . doxazosin (CARDURA) 4 MG tablet TAKE 1 TABLET DAILY  . folic acid (FOLVITE) 1 MG tablet Take 1 tablet (1 mg total) by mouth daily.  Marland Kitchen lisinopril (PRINIVIL,ZESTRIL) 10 MG tablet Take 1 tablet (10 mg total) by mouth daily.  . potassium chloride (K-DUR) 10  MEQ tablet Take 1 tablet (10 mEq total) by mouth 2 (two) times daily.  . rosuvastatin (CRESTOR) 10 MG tablet Take 1 tablet (10 mg total) by mouth daily.  . vitamin B-12 1000 MCG tablet Take 1 tablet (1,000 mcg total) by mouth daily.  Marland Kitchen acetaminophen (TYLENOL) 325 MG tablet Take 2 tablets (650 mg total) by mouth 3 (three) times daily. (Patient not taking: Reported on 02/07/2018)  . clarithromycin (BIAXIN) 250 MG tablet Take 250 mg by mouth 2 (two) times daily.  Marland Kitchen gabapentin (NEURONTIN) 100 MG capsule Take 1 capsule (100 mg total) by mouth 2 (two) times daily. (Patient not taking: Reported on 01/08/2018)  . gabapentin (NEURONTIN) 300 MG capsule Take 1 capsule (300 mg total) by mouth at bedtime. (Patient not taking: Reported on 01/08/2018)  . pantoprazole sodium (PROTONIX) 40 mg/20 mL PACK 20mg  po bid (Patient not taking: Reported on 02/07/2018)  . [DISCONTINUED] amoxicillin (AMOXIL) 500 MG tablet Take 500 mg by mouth 2 (two) times daily.   No facility-administered encounter medications on file as of 02/07/2018.     Allergies  Allergen Reactions  . Tamsulosin Hcl Hives  . Sulfa Antibiotics Rash    Review of Systems  Constitutional: Positive for malaise/fatigue. Negative for fever.  HENT: Negative.   Eyes: Negative.   Respiratory: Negative for cough, shortness of breath and wheezing.   Cardiovascular: Negative for chest pain, palpitations, orthopnea and leg swelling.  Gastrointestinal: Negative for abdominal pain, blood in stool, constipation, diarrhea, heartburn, melena, nausea and vomiting.  Genitourinary: Negative.   Skin: Negative.   Neurological: Negative.   Endo/Heme/Allergies: Negative.   Psychiatric/Behavioral: Negative.     Objective:  BP 106/68 (BP Location: Right Arm, Patient Position: Sitting, Cuff Size: Normal)   Pulse 70   Temp 98.1 F (36.7 C) (Oral)   Resp 16   Wt 189 lb (85.7 kg)   BMI 25.99 kg/m   Physical Exam  Constitutional: He is oriented to person, place, and  time and well-developed, well-nourished, and in no distress.  HENT:  Head: Normocephalic and atraumatic.  Eyes: Conjunctivae are normal. No scleral icterus.  Neck: No thyromegaly present.  Cardiovascular: Normal rate, regular rhythm and normal heart sounds.  Pulmonary/Chest: Effort normal and breath sounds normal.  Abdominal: Soft.  Neurological: He is alert and oriented to person, place, and time. Gait normal. GCS score is 15.  Skin: Skin is warm and dry.  2cm rash upper inner thigh c/w tinea corporis.  Psychiatric: Mood, memory, affect and judgment normal.    Assessment and Plan :  1. Essential hypertension  -  lisinopril (PRINIVIL,ZESTRIL) 10 MG tablet; Take 1 tablet (10 mg total) by mouth daily.  Dispense: 90 tablet; Refill: 3  2. Acute gastrointestinal hemorrhage BID until next visit with GI or here.  - pantoprazole sodium (PROTONIX) 40 mg; 20mg  po bid  Dispense: 120 each; Refill: 0  3. Tinea corporis - clotrimazole-betamethasone (LOTRISONE) cream; Apply 1 application topically 2 (two) times daily.  Dispense: 30 g; Refill: 0 4.Anemia  I have done the exam and reviewed the chart and it is accurate to the best of my knowledge. Development worker, community has been used and  any errors in dictation or transcription are unintentional. [name] M.D. [facility]

## 2018-02-13 NOTE — Telephone Encounter (Signed)
Spoke with wife and she states pt was not home and to [facility] tomorrow to schedule AWV. -MM

## 2018-02-14 ENCOUNTER — Inpatient Hospital Stay: Payer: Medicare Other

## 2018-02-14 ENCOUNTER — Inpatient Hospital Stay: Payer: Medicare Other | Attending: Hematology and Oncology | Admitting: Hematology and Oncology

## 2018-02-14 ENCOUNTER — Encounter: Payer: Self-pay | Admitting: Hematology and Oncology

## 2018-02-14 ENCOUNTER — Other Ambulatory Visit: Payer: Self-pay | Admitting: Family Medicine

## 2018-02-14 VITALS — BP 97/57 | HR 73 | Temp 97.2°F | Resp 18 | Wt 188.9 lb

## 2018-02-14 DIAGNOSIS — N400 Enlarged prostate without lower urinary tract symptoms: Secondary | ICD-10-CM

## 2018-02-14 DIAGNOSIS — Z87891 Personal history of nicotine dependence: Secondary | ICD-10-CM | POA: Diagnosis not present

## 2018-02-14 DIAGNOSIS — R0602 Shortness of breath: Secondary | ICD-10-CM | POA: Diagnosis not present

## 2018-02-14 DIAGNOSIS — Q2733 Arteriovenous malformation of digestive system vessel: Secondary | ICD-10-CM

## 2018-02-14 DIAGNOSIS — K5520 Angiodysplasia of colon without hemorrhage: Secondary | ICD-10-CM

## 2018-02-14 DIAGNOSIS — R5383 Other fatigue: Secondary | ICD-10-CM | POA: Diagnosis not present

## 2018-02-14 DIAGNOSIS — Z79899 Other long term (current) drug therapy: Secondary | ICD-10-CM | POA: Diagnosis not present

## 2018-02-14 DIAGNOSIS — D696 Thrombocytopenia, unspecified: Secondary | ICD-10-CM

## 2018-02-14 DIAGNOSIS — E538 Deficiency of other specified B group vitamins: Secondary | ICD-10-CM | POA: Diagnosis not present

## 2018-02-14 DIAGNOSIS — D500 Iron deficiency anemia secondary to blood loss (chronic): Secondary | ICD-10-CM

## 2018-02-14 DIAGNOSIS — K922 Gastrointestinal hemorrhage, unspecified: Secondary | ICD-10-CM

## 2018-02-14 DIAGNOSIS — R5381 Other malaise: Secondary | ICD-10-CM

## 2018-02-14 DIAGNOSIS — Z7982 Long term (current) use of aspirin: Secondary | ICD-10-CM

## 2018-02-14 DIAGNOSIS — D519 Vitamin B12 deficiency anemia, unspecified: Secondary | ICD-10-CM

## 2018-02-14 LAB — CBC WITH DIFFERENTIAL/PLATELET
Basophils Absolute: 0 10*3/uL (ref 0–0.1)
Basophils Relative: 1 %
Eosinophils Absolute: 0.2 10*3/uL (ref 0–0.7)
Eosinophils Relative: 4 %
HCT: 36 % — ABNORMAL LOW (ref 40.0–52.0)
Hemoglobin: 12.3 g/dL — ABNORMAL LOW (ref 13.0–18.0)
Lymphocytes Relative: 24 %
Lymphs Abs: 1.2 10*3/uL (ref 1.0–3.6)
MCH: 31.4 pg (ref 26.0–34.0)
MCHC: 34 g/dL (ref 32.0–36.0)
MCV: 92.4 fL (ref 80.0–100.0)
Monocytes Absolute: 0.4 10*3/uL (ref 0.2–1.0)
Monocytes Relative: 8 %
Neutro Abs: 3.2 10*3/uL (ref 1.4–6.5)
Neutrophils Relative %: 63 %
Platelets: 101 10*3/uL — ABNORMAL LOW (ref 150–440)
RBC: 3.9 MIL/uL — ABNORMAL LOW (ref 4.40–5.90)
RDW: 13.8 % (ref 11.5–14.5)
WBC: 5.1 10*3/uL (ref 3.8–10.6)

## 2018-02-14 LAB — VITAMIN B12: Vitamin B-12: 427 pg/mL (ref 180–914)

## 2018-02-14 LAB — FERRITIN: Ferritin: 177 ng/mL (ref 24–336)

## 2018-02-14 LAB — FOLATE: Folate: 56.4 ng/mL (ref >5.9)

## 2018-02-14 MED ORDER — PANTOPRAZOLE SODIUM 40 MG PO PACK: PACK | ORAL | 0 refills | Status: DC

## 2018-02-14 NOTE — Progress Notes (Signed)
Pt in for follow up, states "still very tired, no improvement".  Reports having shortness of breath on minimal exertion, has to stop activity and rest.  Pt also reports has never had gabapentin filled, was not comfortable after reading side effects.

## 2018-02-14 NOTE — Progress Notes (Signed)
[facility] day:  02/14/2018  Chief Complaint: Dwayne Thompson is a 72 y.o. 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  HPI:  The patient was last seen in the medical oncology clinic on 01/10/2018.  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  He had also been diagnosed with H pylori, but had not picked up his medications.  He was on oral B12 and folate for documented deficiencies.  CBC included a hematocrit of 29.3, hemoglobin 10.2, MCV 94.2, platelets 146,000, and WBC 4400.  Normal labs included:  HIV testing, anti-parietal antibodies, intrinsic factor antibodies. Ferritin was 231. Iron saturation was 26% and TIBC 313. Retic was 3.8%.  Sed rate was 26.  During the interim, patient continues to have significant fatigue and exertional shortness of breath. He notes minimal improvement. Patient denies bleeding; no hematochezia, melena, or gross hematuria. Patient maintains a normal diet. Weight down 1 pound. Patient continues to drink alcohol. He notes that he is trying to cut back, however at the present, his intake is "about the same".   He denies pain in the clinic.    Past Medical History:  Diagnosis Date  . Back pain   . BPH (benign prostatic hyperplasia)   . ED (erectile dysfunction)     Past Surgical History:  Procedure Laterality Date  . COLONOSCOPY Left 01/03/2018   Procedure: COLONOSCOPY;  Surgeon: Virgel Manifold, MD;  Location: [facility] ENDOSCOPY;  Service: Endoscopy;  Laterality: Left;  . ENTEROSCOPY  01/03/2018   Procedure: ENTEROSCOPY;  Surgeon: Virgel Manifold, MD;  Location: [facility] ENDOSCOPY;  Service: Endoscopy;;  . ESOPHAGOGASTRODUODENOSCOPY N/A 01/02/2018   Procedure: ESOPHAGOGASTRODUODENOSCOPY (EGD);  Surgeon: Virgel Manifold, MD;  Location: [facility] ENDOSCOPY;  Service: Endoscopy;   Laterality: N/A;  . HERNIA REPAIR     1980's    Family History  Problem Relation Age of Onset  . Alzheimer's disease Mother   . Sarcoidosis Sister   . Diabetes Brother   . Hypertension Brother   . Anxiety disorder Brother   . Kidney disease Neg Hx   . Prostate cancer Neg Hx     Social History:  reports that he quit smoking about 4 years ago. His smoking use included cigarettes. He has a 50.00 pack-year smoking history. He has never used smokeless tobacco. He reports that he drinks alcohol. He reports that he does not use drugs.  He drinks 2-3 alcohol shots/day.  The patient is accompanied by his wife, Dwayne Thompson, and granddaughter, Dwayne Thompson, today.  Allergies:  Allergies  Allergen Reactions  . Tamsulosin Hcl Hives  . Sulfa Antibiotics Rash    Current Medications: Current Outpatient Medications  Medication Sig Dispense Refill  . acetaminophen (TYLENOL) 325 MG tablet Take 2 tablets (650 mg total) by mouth 3 (three) times daily. 180 tablet 0  . aspirin EC 81 MG EC tablet Take 1 tablet (81 mg total) by mouth daily. 180 tablet 0  . Cholecalciferol 1000 UNITS tablet Take 1,000 Units by mouth daily. Reported on 03/03/2016    . clotrimazole-betamethasone (LOTRISONE) cream Apply 1 application topically 2 (two) times daily. 30 g 0  . doxazosin (CARDURA) 4 MG tablet TAKE 1 TABLET DAILY 90 tablet 3  . folic acid (FOLVITE) 1 MG tablet Take 1 tablet (1 mg total) by mouth daily. 180 tablet 0  .  lisinopril (PRINIVIL,ZESTRIL) 10 MG tablet Take 1 tablet (10 mg total) by mouth daily. 90 tablet 3  . pantoprazole sodium (PROTONIX) 40 mg/20 mL PACK 20mg  po bid 120 each 0  . potassium chloride (K-DUR) 10 MEQ tablet Take 1 tablet (10 mEq total) by mouth 2 (two) times daily. 60 tablet 3  . rosuvastatin (CRESTOR) 10 MG tablet Take 1 tablet (10 mg total) by mouth daily. 90 tablet 3  . vitamin B-12 1000 MCG tablet Take 1 tablet (1,000 mcg total) by mouth daily. 180 tablet 1  . gabapentin (NEURONTIN) 100 MG  capsule Take 1 capsule (100 mg total) by mouth 2 (two) times daily. (Patient not taking: Reported on 01/08/2018) 90 capsule 2  . gabapentin (NEURONTIN) 300 MG capsule Take 1 capsule (300 mg total) by mouth at bedtime. (Patient not taking: Reported on 01/08/2018) 90 capsule 2   No current facility-administered medications for this visit.     Review of Systems  Constitutional: Positive for malaise/fatigue and weight loss (down 1 pound). Negative for diaphoresis and fever.  HENT: Negative.   Eyes: Negative.   Respiratory: Positive for shortness of breath (exertional). Negative for cough, hemoptysis and sputum production.   Cardiovascular: Negative for chest pain, palpitations, orthopnea, leg swelling and PND.  Gastrointestinal: Negative for abdominal pain, blood in stool, constipation, diarrhea, melena, nausea and vomiting.  Genitourinary: Negative for dysuria, frequency, hematuria and urgency.  Musculoskeletal: Negative for back pain, falls, joint pain and myalgias.  Skin: Negative for itching and rash.  Neurological: Negative for dizziness, tremors, weakness and headaches.  Endo/Heme/Allergies: Does not bruise/bleed easily.  Psychiatric/Behavioral: Negative for depression, memory loss and suicidal ideas. The patient is not nervous/anxious and does not have insomnia.   All other systems reviewed and are negative.  Performance status (ECOG): 1 - Symptomatic but completely ambulatory   Physical Exam: Blood pressure (!) 97/57, pulse 73, temperature (!) 97.2 F (36.2 C), temperature source Tympanic, resp. rate 18, weight 188 lb 15 oz (85.7 kg), SpO2 100 %. GENERAL:  Well developed, well nourished, gentleman sitting comfortably in the exam room in no acute distress. MENTAL STATUS:  Alert and oriented to person, place and time. HEAD:  Short black hair.  Male pattern baldness.  Normocephalic, atraumatic, face symmetric, no Cushingoid features. EYES:  Brown eyes.  Pupils equal round and reactive to  light and accomodation.  No conjunctivitis or scleral icterus. ENT:  Oropharynx clear without lesion.  Tongue normal. Mucous membranes moist.  RESPIRATORY:  Clear to auscultation without rales, wheezes or rhonchi. CARDIOVASCULAR:  Regular rate and rhythm without murmur, rub or gallop. ABDOMEN:  Soft, non-tender, with active bowel sounds, and no hepatosplenomegaly.  No masses. SKIN:  No rashes, ulcers or lesions. EXTREMITIES: No edema, no skin discoloration or tenderness.  No palpable cords. LYMPH NODES: No palpable cervical, supraclavicular, axillary or inguinal adenopathy  NEUROLOGICAL: Unremarkable. PSYCH:  Appropriate.    Appointment on 02/14/2018  Component Date Value Ref Range Status  . WBC 02/14/2018 5.1  3.8 - 10.6 K/uL Final  . RBC 02/14/2018 3.90* 4.40 - 5.90 MIL/uL Final  . Hemoglobin 02/14/2018 12.3* 13.0 - 18.0 g/dL Final  . HCT 02/14/2018 36.0* 40.0 - 52.0 % Final  . MCV 02/14/2018 92.4  80.0 - 100.0 fL Final  . MCH 02/14/2018 31.4  26.0 - 34.0 pg Final  . MCHC 02/14/2018 34.0  32.0 - 36.0 g/dL Final  . RDW 02/14/2018 13.8  11.5 - 14.5 % Final  . Platelets 02/14/2018 101* 150 - 440 K/uL  Final  . Neutrophils Relative % 02/14/2018 63  % Final  . Neutro Abs 02/14/2018 3.2  1.4 - 6.5 K/uL Final  . Lymphocytes Relative 02/14/2018 24  % Final  . Lymphs Abs 02/14/2018 1.2  1.0 - 3.6 K/uL Final  . Monocytes Relative 02/14/2018 8  % Final  . Monocytes Absolute 02/14/2018 0.4  0.2 - 1.0 K/uL Final  . Eosinophils Relative 02/14/2018 4  % Final  . Eosinophils Absolute 02/14/2018 0.2  0 - 0.7 K/uL Final  . Basophils Relative 02/14/2018 1  % Final  . Basophils Absolute 02/14/2018 0.0  0 - 0.1 K/uL Final   Performed at [facility], [address] [place], [place] [address]    Assessment:  BRANDIN STETZER is a 72 y.o. 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  He has had mild chronic thrombocytopenia since 04/2017.  Platelet count  has ranged between 101,000 - 134,000.  During his active GI bleed platelet nadir was 83,000.  He has B12 deficiency and folate deficiency.  B12 was 221 and folate 5.7 on 01/03/2018.  He is on oral B71 and folic acid.  Diet is good.  He drinks alcohol daily.  Anemia and thrombocytopenia work-up on 01/04/2018 revealed the following normal studies: hepatitis B surface antigen, hepatitis B core antibody total, hepatitis C antibody, and ANA.  Ferritin was 272.  Iron saturation was 34% with a TIBC of 265.  Abdominal ultrasound on 01/03/2018 revealed a normal spleen.  Upper endoscopy on 01/02/2018 revealed a normal esophagus, erythematous mucosa in the antrum, erythematous duodenopathy, and nodular mucosa in the duodenal bulb.  There was no ulcerations or active sites of bleeding.  Pathology revealed mild acute duodenitis, negative for dysplasia and malignancy.  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  There was no dysplasia or malignancy.  Colonoscopy on 01/03/2018 revealed melena in the entire colon c/w bleeding from the proximal GI tract.  Small bowel enteroscopy on 01/03/2018 revealed no actively bleeding lesions.  The examined portion of the jejunum was normal.    Capsule study on 01/03/2018 revealed a red lesion in the first 7% of the study likely representing duodenal erythema and nodularity noted on recent EGD.  There was a red spot at 78% with subsequent small red blood strands which could represent an oozing AVM in the distal small bowel.  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  Symptomatically, he has persistent fatigue and exertional dyspnea. He denies bleeding; no hematochezia, melena, or gross hematuria.  He continues to drink alcohol daily. Exam is unremarkable.   Plan:   1. Labs today:  CBC with diff, ferritin, B12, folate. 2. Discuss continuation of oral B12 and folate for documented B12 deficiency  and folate deficiency.   3. Discuss follow up with GI.  Scheduled to follow up in 03/2018. Will plan on repeat H.pylori testing. He has completed antibiotic course. Anticipate repeat colonoscopy in future as obscured by melena.  4. Discuss alcohol intake. Patient encouraged to reduce the amount he is drinking. 5. RTC in 3 months for MD assessment, labs (CBC with diff, ferritin, B12, folate).   Honor Loh, NP 02/14/2018, 10:37 AM   I saw and evaluated the patient, participating in the key portions of the service and reviewing pertinent diagnostic studies and records.  I reviewed the nurse practitioner's note and agree with the findings and the plan.  The assessment and plan were discussed with the patient.  Multiple questions were asked by the patient and answered.   Nolon Stalls, MD 02/14/2018,10:37 AM

## 2018-02-14 NOTE — Telephone Encounter (Signed)
[facility] faxed a refill request for the following medication. Thanks CC  pantoprazole sodium (PROTONIX) 40 mg/20 mL PACK

## 2018-02-16 ENCOUNTER — Telehealth: Payer: Self-pay

## 2018-02-16 NOTE — Telephone Encounter (Signed)
LM for pt to CB and schedule AWV. -MM

## 2018-02-18 DIAGNOSIS — D500 Iron deficiency anemia secondary to blood loss (chronic): Secondary | ICD-10-CM | POA: Insufficient documentation

## 2018-02-19 NOTE — Telephone Encounter (Signed)
Scheduled AWV and CPE for 04/17/18 @ 9 and 940 AM. Pt wanted apts to be back to back.  -MM

## 2018-02-23 NOTE — Telephone Encounter (Signed)
Completed, see 02/16/18 TE.

## 2018-03-19 ENCOUNTER — Encounter: Payer: Self-pay | Admitting: Gastroenterology

## 2018-03-19 ENCOUNTER — Encounter (INDEPENDENT_AMBULATORY_CARE_PROVIDER_SITE_OTHER): Payer: Self-pay

## 2018-03-19 ENCOUNTER — Other Ambulatory Visit: Payer: Self-pay

## 2018-03-19 ENCOUNTER — Ambulatory Visit (INDEPENDENT_AMBULATORY_CARE_PROVIDER_SITE_OTHER): Payer: Medicare Other | Admitting: Gastroenterology

## 2018-03-19 VITALS — BP 106/68 | HR 78 | Ht 71.5 in | Wt 190.6 lb

## 2018-03-19 DIAGNOSIS — A048 Other specified bacterial intestinal infections: Secondary | ICD-10-CM

## 2018-03-19 DIAGNOSIS — K2970 Gastritis, unspecified, without bleeding: Secondary | ICD-10-CM | POA: Diagnosis not present

## 2018-03-19 DIAGNOSIS — Z1211 Encounter for screening for malignant neoplasm of colon: Secondary | ICD-10-CM

## 2018-03-19 MED ORDER — PEG 3350-KCL-NA BICARB-NACL 420 G PO SOLR: 4000.0000 mL | Freq: Once | ORAL | 0 refills | Status: AC

## 2018-03-19 MED ORDER — BISACODYL 5 MG PO TBEC: 10.0000 mg | DELAYED_RELEASE_TABLET | Freq: Once | ORAL | 0 refills | Status: AC

## 2018-03-19 NOTE — Progress Notes (Signed)
Dwayne Antigua, MD [address]  [place]  [place], [place] [address]  Main: [phone]  Fax: [phone]   Primary Care Physician: Jerrol Banana., MD  Primary Gastroenterologist:  Dr. Vonda Thompson  Chief Complaint  Patient presents with  . Follow-up    Melena, H.Pylori infection    HPI: Dwayne Thompson is a 72 y.o. male here for follow-up of H. pylori, and history of melena and hospitalization for the reports brown bowel movements daily.  No blood in stool.  No abdominal pain, nausea vomiting, dysphagia, heartburn, weight loss.  Completed triple therapy for H. pylori.  Last hemoglobin was around 12 and is being followed by hematology.  Previous history:  Patient was seen for hospitalization consult on April 15 due to melena.  He also had history of ibuprofen use prior to the hospitalization.  He underwent extensive work-up.  EGD on April 16 showed erythematous mucosa in the antrum, duodenum.  Nodular erythematous mucosa in the duodenum was biopsied and clipped.  No active bleeding was seen during the EGD.  Colonoscopy on April 17 showed melena but no red blood.  No actively bleeding lesions.  This was consistent with a more proximal GI source of bleeding.  He did underwent push enteroscopy that showed same findings as a previous EGD, and no actively bleeding lesions.  Normal duodenum.  Capsule endoscopy was performed and did not identify specific source of bleeding.  However, small nonspecific red blood strand was seen in the small bowel, which could represent an oozing AVM (however, no AVMs were identified on the pill camera itself).  Gastric biopsies showed H. pylori, and he has completed H. pylori triple therapy    Current Outpatient Medications  Medication Sig Dispense Refill  . acetaminophen (TYLENOL) 325 MG tablet Take 2 tablets (650 mg total) by mouth 3 (three) times daily. 180 tablet 0  . aspirin EC 81 MG EC tablet Take 1 tablet (81 mg total) by mouth  daily. 180 tablet 0  . Cholecalciferol 1000 UNITS tablet Take 1,000 Units by mouth daily. Reported on 03/03/2016    . clotrimazole-betamethasone (LOTRISONE) cream Apply 1 application topically 2 (two) times daily. 30 g 0  . doxazosin (CARDURA) 4 MG tablet TAKE 1 TABLET DAILY 90 tablet 3  . folic acid (FOLVITE) 1 MG tablet Take 1 tablet (1 mg total) by mouth daily. 180 tablet 0  . gabapentin (NEURONTIN) 100 MG capsule Take 1 capsule (100 mg total) by mouth 2 (two) times daily. (Patient not taking: Reported on 01/08/2018) 90 capsule 2  . gabapentin (NEURONTIN) 300 MG capsule Take 1 capsule (300 mg total) by mouth at bedtime. (Patient not taking: Reported on 01/08/2018) 90 capsule 2  . lisinopril (PRINIVIL,ZESTRIL) 10 MG tablet Take 1 tablet (10 mg total) by mouth daily. 90 tablet 3  . pantoprazole sodium (PROTONIX) 40 mg/20 mL PACK 20mg  po bid 120 each 0  . potassium chloride (K-DUR) 10 MEQ tablet Take 1 tablet (10 mEq total) by mouth 2 (two) times daily. 60 tablet 3  . rosuvastatin (CRESTOR) 10 MG tablet Take 1 tablet (10 mg total) by mouth daily. 90 tablet 3  . vitamin B-12 1000 MCG tablet Take 1 tablet (1,000 mcg total) by mouth daily. 180 tablet 1   No current facility-administered medications for this visit.     Allergies as of 03/19/2018 - Review Complete 03/19/2018  Allergen Reaction Noted  . Tamsulosin hcl Hives 03/28/2015  . Sulfa antibiotics Rash 01/02/2018  ROS:  General: Negative for anorexia, weight loss, fever, chills, fatigue, weakness. ENT: Negative for hoarseness, difficulty swallowing , nasal congestion. CV: Negative for chest pain, angina, palpitations, dyspnea on exertion, peripheral edema.  Respiratory: Negative for dyspnea at rest, dyspnea on exertion, cough, sputum, wheezing.  GI: See history of present illness. GU:  Negative for dysuria, hematuria, urinary incontinence, urinary frequency, nocturnal urination.  Endo: Negative for unusual weight change.    Physical  Examination:   BP (!) 94/58 (BP Location: Left Arm, Patient Position: Sitting, Cuff Size: Normal)   Pulse 78   Ht 5' 11.5" (1.816 m)   Wt 190 lb 9.6 oz (86.5 kg)   BMI 26.21 kg/m   General: Well-nourished, well-developed in no acute distress.  Eyes: No icterus. Conjunctivae pink. Mouth: Oropharyngeal mucosa moist and pink , no lesions erythema or exudate. Neck: Supple, Trachea midline Abdomen: Bowel sounds are normal, nontender, nondistended, no hepatosplenomegaly or masses, no abdominal bruits or hernia , no rebound or guarding.   Extremities: No lower extremity edema. No clubbing or deformities. Neuro: Alert and oriented x 3.  Grossly intact. Skin: Warm and dry, no jaundice.   Psych: Alert and cooperative, normal mood and affect.   Labs: CMP     Component Value Date/Time   NA 135 01/02/2018 0519   NA 143 12/05/2017 0831   K 3.4 (L) 01/02/2018 0519   CL 109 01/02/2018 0519   CO2 22 01/02/2018 0519   GLUCOSE 96 01/02/2018 0519   BUN 23 (H) 01/02/2018 0519   BUN 10 12/05/2017 0831   CREATININE 0.81 01/02/2018 0519   CALCIUM 8.2 (L) 01/02/2018 0519   PROT 7.1 01/01/2018 1041   PROT 7.1 12/05/2017 0831   ALBUMIN 4.1 01/01/2018 1041   ALBUMIN 4.3 12/05/2017 0831   AST 26 01/01/2018 1041   ALT 17 01/01/2018 1041   ALKPHOS 41 01/01/2018 1041   BILITOT 0.7 01/01/2018 1041   BILITOT 0.6 12/05/2017 0831   GFRNONAA >60 01/02/2018 0519   GFRAA >60 01/02/2018 0519   Lab Results  Component Value Date   WBC 5.1 02/14/2018   HGB 12.3 (L) 02/14/2018   HCT 36.0 (L) 02/14/2018   MCV 92.4 02/14/2018   PLT 101 (L) 02/14/2018    Imaging Studies: No results found.  Assessment and Plan:   Dwayne Thompson is a 72 y.o. y/o male here for follow-up of H. pylori diagnosed via biopsies during EGD done for melena  Patient is due for colorectal cancer screening The colonoscopy done during his hospitalization had melena throughout the colon, therefore was not a good screening exam He is  willing colonoscopy screening at this time  We also discussed repeating EGD along with a colonoscopy, to obtain repeat biopsies for H. pylori He is on PPI currently, and will complete his PPI in the next 2 to 3 days and has not had any more reflux.  Since he does not have any abdominal pain, hemoglobin is improved, no indication for further PPI therapy unless symptoms recur.  I have asked him to call me if symptoms occur.  I have discussed alternative options, risks & benefits,  which include, but are not limited to, bleeding, infection, perforation,respiratory complication & drug reaction.  The patient agrees with this plan & written consent will be obtained.    He is continue to avoid NSAIDs, and this was discussed again with him  Melena during April 2019 admission may have been due to small bowel AVM versus small bowel ulcers from ibuprofen  use This is completely resolved Hemoglobin has significantly improved.  See HPI for details of previous hospitalization    Dr Dwayne Thompson

## 2018-04-04 ENCOUNTER — Encounter: Payer: Self-pay | Admitting: *Deleted

## 2018-04-05 ENCOUNTER — Ambulatory Visit: Payer: Medicare Other | Admitting: Anesthesiology

## 2018-04-05 ENCOUNTER — Encounter
Admission: RE | Disposition: A | Discharge status: Home / Self Care | Payer: Self-pay | Source: Ambulatory Visit | Attending: Gastroenterology

## 2018-04-05 ENCOUNTER — Ambulatory Visit
Admission: RE | Admit: 2018-04-05 | Discharge: 2018-04-05 | Disposition: A | Discharge status: Home / Self Care | Payer: Medicare Other | Source: Ambulatory Visit | Attending: Gastroenterology | Admitting: Gastroenterology

## 2018-04-05 ENCOUNTER — Encounter: Payer: Self-pay | Admitting: Anesthesiology

## 2018-04-05 DIAGNOSIS — Z09 Encounter for follow-up examination after completed treatment for conditions other than malignant neoplasm: Secondary | ICD-10-CM | POA: Diagnosis not present

## 2018-04-05 DIAGNOSIS — K5730 Diverticulosis of large intestine without perforation or abscess without bleeding: Secondary | ICD-10-CM | POA: Diagnosis not present

## 2018-04-05 DIAGNOSIS — Z87891 Personal history of nicotine dependence: Secondary | ICD-10-CM | POA: Diagnosis not present

## 2018-04-05 DIAGNOSIS — A048 Other specified bacterial intestinal infections: Secondary | ICD-10-CM

## 2018-04-05 DIAGNOSIS — D122 Benign neoplasm of ascending colon: Secondary | ICD-10-CM

## 2018-04-05 DIAGNOSIS — K3189 Other diseases of stomach and duodenum: Secondary | ICD-10-CM

## 2018-04-05 DIAGNOSIS — K648 Other hemorrhoids: Secondary | ICD-10-CM

## 2018-04-05 DIAGNOSIS — K219 Gastro-esophageal reflux disease without esophagitis: Secondary | ICD-10-CM | POA: Diagnosis not present

## 2018-04-05 DIAGNOSIS — K635 Polyp of colon: Secondary | ICD-10-CM | POA: Diagnosis not present

## 2018-04-05 DIAGNOSIS — K2950 Unspecified chronic gastritis without bleeding: Secondary | ICD-10-CM

## 2018-04-05 DIAGNOSIS — D125 Benign neoplasm of sigmoid colon: Secondary | ICD-10-CM

## 2018-04-05 DIAGNOSIS — Z79899 Other long term (current) drug therapy: Secondary | ICD-10-CM | POA: Insufficient documentation

## 2018-04-05 DIAGNOSIS — B9681 Helicobacter pylori [H. pylori] as the cause of diseases classified elsewhere: Secondary | ICD-10-CM | POA: Diagnosis not present

## 2018-04-05 DIAGNOSIS — K2289 Other specified disease of esophagus: Secondary | ICD-10-CM

## 2018-04-05 DIAGNOSIS — K228 Other specified diseases of esophagus: Secondary | ICD-10-CM

## 2018-04-05 DIAGNOSIS — Z1211 Encounter for screening for malignant neoplasm of colon: Secondary | ICD-10-CM | POA: Diagnosis not present

## 2018-04-05 DIAGNOSIS — K2970 Gastritis, unspecified, without bleeding: Secondary | ICD-10-CM

## 2018-04-05 DIAGNOSIS — Z8719 Personal history of other diseases of the digestive system: Secondary | ICD-10-CM | POA: Insufficient documentation

## 2018-04-05 DIAGNOSIS — K2210 Ulcer of esophagus without bleeding: Secondary | ICD-10-CM | POA: Diagnosis not present

## 2018-04-05 HISTORY — PX: ESOPHAGOGASTRODUODENOSCOPY (EGD) WITH PROPOFOL: SHX5813

## 2018-04-05 HISTORY — PX: COLONOSCOPY WITH PROPOFOL: SHX5780

## 2018-04-05 HISTORY — DX: Anemia, unspecified: D64.9

## 2018-04-05 SURGERY — COLONOSCOPY WITH PROPOFOL
Anesthesia: General

## 2018-04-05 MED ORDER — MIDAZOLAM HCL 2 MG/2ML IJ SOLN
INTRAMUSCULAR | Status: DC | PRN
  Administered 2018-04-05: 1 mg via INTRAVENOUS

## 2018-04-05 MED ORDER — FENTANYL CITRATE (PF) 100 MCG/2ML IJ SOLN
INTRAMUSCULAR | Status: AC
  Filled 2018-04-05: qty 2

## 2018-04-05 MED ORDER — ONDANSETRON HCL 4 MG/2ML IJ SOLN
INTRAMUSCULAR | Status: AC
  Filled 2018-04-05: qty 2

## 2018-04-05 MED ORDER — SODIUM CHLORIDE 0.9 % IV SOLN
INTRAVENOUS | Status: DC
  Administered 2018-04-05: 1000 mL via INTRAVENOUS

## 2018-04-05 MED ORDER — GLYCOPYRROLATE 0.2 MG/ML IJ SOLN
INTRAMUSCULAR | Status: DC | PRN
  Administered 2018-04-05: 0.1 mg via INTRAVENOUS

## 2018-04-05 MED ORDER — PHENYLEPHRINE HCL 10 MG/ML IJ SOLN
INTRAMUSCULAR | Status: DC | PRN
  Administered 2018-04-05: 50 ug via INTRAVENOUS
  Administered 2018-04-05: 100 ug via INTRAVENOUS
  Administered 2018-04-05 (×3): 50 ug via INTRAVENOUS

## 2018-04-05 MED ORDER — FENTANYL CITRATE (PF) 100 MCG/2ML IJ SOLN
INTRAMUSCULAR | Status: DC | PRN
  Administered 2018-04-05: 25 ug via INTRAVENOUS

## 2018-04-05 MED ORDER — PROPOFOL 10 MG/ML IV BOLUS
INTRAVENOUS | Status: DC | PRN
  Administered 2018-04-05: 50 mg via INTRAVENOUS

## 2018-04-05 MED ORDER — MIDAZOLAM HCL 2 MG/2ML IJ SOLN
INTRAMUSCULAR | Status: AC
  Filled 2018-04-05: qty 2

## 2018-04-05 MED ORDER — PROPOFOL 500 MG/50ML IV EMUL
INTRAVENOUS | Status: DC | PRN
  Administered 2018-04-05: 80 ug/kg/min via INTRAVENOUS

## 2018-04-05 NOTE — H&P (Signed)
[name], MD [address] [place], [place], [place], [address] [address], [place], [place], [place], [address] Phone: [phone]  Fax: [phone]  Primary Care Physician:  Jerrol Banana., MD   Pre-Procedure History & Physical: HPI:  Dwayne Thompson is a 72 y.o. male is here for a colonoscopy and EGD.   Past Medical History:  Diagnosis Date  . Anemia   . Back pain   . BPH (benign prostatic hyperplasia)   . ED (erectile dysfunction)     Past Surgical History:  Procedure Laterality Date  . COLONOSCOPY Left 01/03/2018   Procedure: COLONOSCOPY;  Surgeon: Virgel Manifold, MD;  Location: [facility] ENDOSCOPY;  Service: Endoscopy;  Laterality: Left;  . ENTEROSCOPY  01/03/2018   Procedure: ENTEROSCOPY;  Surgeon: Virgel Manifold, MD;  Location: [facility] ENDOSCOPY;  Service: Endoscopy;;  . ESOPHAGOGASTRODUODENOSCOPY N/A 01/02/2018   Procedure: ESOPHAGOGASTRODUODENOSCOPY (EGD);  Surgeon: Virgel Manifold, MD;  Location: [facility] ENDOSCOPY;  Service: Endoscopy;  Laterality: N/A;  . HERNIA REPAIR     1980's    Prior to Admission medications   Medication Sig Start Date End Date Taking? Authorizing Provider  acetaminophen (TYLENOL) 325 MG tablet Take 2 tablets (650 mg total) by mouth 3 (three) times daily. 01/04/18  Yes Salary, Avel Peace, MD  aspirin EC 81 MG EC tablet Take 1 tablet (81 mg total) by mouth daily. 01/04/18  Yes Salary, Avel Peace, MD  Cholecalciferol 1000 UNITS tablet Take 1,000 Units by mouth daily. Reported on 03/03/2016   Yes [provider]  clotrimazole-betamethasone (LOTRISONE) cream Apply 1 application topically 2 (two) times daily. 02/07/18  Yes Jerrol Banana., MD  doxazosin (CARDURA) 4 MG tablet TAKE 1 TABLET DAILY 04/14/17  Yes Jerrol Banana., MD  folic acid (FOLVITE) 1 MG tablet Take 1 tablet (1 mg total) by mouth daily. 01/05/18  Yes Salary, Avel Peace, MD  gabapentin (NEURONTIN) 100 MG capsule Take 1 capsule (100 mg total)  by mouth 2 (two) times daily. 01/04/18 01/04/19 Yes Salary, Avel Peace, MD  gabapentin (NEURONTIN) 300 MG capsule Take 1 capsule (300 mg total) by mouth at bedtime. 01/04/18 01/04/19 Yes Salary, Avel Peace, MD  lisinopril (PRINIVIL,ZESTRIL) 10 MG tablet Take 1 tablet (10 mg total) by mouth daily. 02/07/18  Yes Jerrol Banana., MD  pantoprazole sodium (PROTONIX) 40 mg/20 mL PACK 20mg  po bid 02/14/18  Yes Jerrol Banana., MD  potassium chloride (K-DUR) 10 MEQ tablet Take 1 tablet (10 mEq total) by mouth 2 (two) times daily. 12/08/17  Yes Jerrol Banana., MD  rosuvastatin (CRESTOR) 10 MG tablet Take 1 tablet (10 mg total) by mouth daily. 11/06/17  Yes Jerrol Banana., MD  vitamin B-12 1000 MCG tablet Take 1 tablet (1,000 mcg total) by mouth daily. 01/05/18  Yes Salary, Avel Peace, MD    Allergies as of 03/20/2018 - Review Complete 03/19/2018  Allergen Reaction Noted  . Tamsulosin hcl Hives 03/28/2015  . Sulfa antibiotics Rash 01/02/2018    Family History  Problem Relation Age of Onset  . Alzheimer's disease Mother   . Sarcoidosis Sister   . Diabetes Brother   . Hypertension Brother   . Anxiety disorder Brother   . Kidney disease Neg Hx   . Prostate cancer Neg Hx     Social History   Socioeconomic History  . Marital status: Married    Spouse name: [name]  . Number of children: 3  . Years of education:  12  . Highest education level: Not on file  Occupational History  . Occupation: retired  Scientific laboratory technician  . Financial resource strain: Not on file  . Food insecurity:    Worry: Not on file    Inability: Not on file  . Transportation needs:    Medical: Not on file    Non-medical: Not on file  Tobacco Use  . Smoking status: Former Smoker    Packs/day: 1.00    Years: 50.00    Pack years: 50.00    Types: Cigarettes    Last attempt to quit: 10/20/2013    Years since quitting: 4.4  . Smokeless tobacco: Never Used  Substance and Sexual Activity  . Alcohol use: Yes      Comment: daily, 3 to 4 drinks daily, usually gin and chases it down with soda  . Drug use: No  . Sexual activity: Not Currently  Lifestyle  . Physical activity:    Days per week: Not on file    Minutes per session: Not on file  . Stress: Not on file  Relationships  . Social connections:    Talks on phone: Not on file    Gets together: Not on file    Attends religious service: Not on file    Active member of club or organization: Not on file    Attends meetings of clubs or organizations: Not on file    Relationship status: Not on file  . Intimate partner violence:    Fear of current or ex partner: Not on file    Emotionally abused: Not on file    Physically abused: Not on file    Forced sexual activity: Not on file  Other Topics Concern  . Not on file  Social History Narrative  . Not on file    Review of Systems: See HPI, otherwise negative ROS  Physical Exam: BP 111/80   Pulse 76   Temp (!) 96.9 F (36.1 C) (Tympanic)   Resp 20   Ht 5\' 11"  (1.803 m)   Wt 190 lb (86.2 kg)   SpO2 100%   BMI 26.50 kg/m  General:   Alert,  pleasant and cooperative in NAD Head:  Normocephalic and atraumatic. Neck:  Supple; no masses or thyromegaly. Lungs:  Clear throughout to auscultation, normal respiratory effort.    Heart:  +S1, +S2, Regular rate and rhythm, No edema. Abdomen:  Soft, nontender and nondistended. Normal bowel sounds, without guarding, and without rebound.   Neurologic:  Alert and  oriented x4;  grossly normal neurologically.  Impression/Plan: Dwayne Thompson is here for a colonoscopy to be performed for average risk screening and EGD for history of H Pylori  Risks, benefits, limitations, and alternatives regarding the procedures have been reviewed with the patient.  Questions have been answered.  All parties agreeable.   Virgel Manifold, MD  04/05/2018, 9:38 AM

## 2018-04-05 NOTE — Op Note (Signed)
[facility] Gastroenterology Patient Name: Dwayne Thompson Procedure Date: 04/05/2018 9:36 AM MRN: [id number] Account #: [id number] Date of Birth: [birth year] Admit Type: Outpatient Age: 72 Room: [facility] ENDO ROOM 4 Gender: Male Note Status: Finalized Procedure:            Colonoscopy Indications:          Screening for colorectal malignant neoplasm Providers:            Jaxton Casale B. Bonna Gains MD, MD Referring MD:         Janine Ores. Rosanna Randy, MD (Referring MD) Medicines:            Monitored Anesthesia Care Complications:        No immediate complications. Procedure:            Pre-Anesthesia Assessment:                       - ASA Grade Assessment: III - A patient with severe                        systemic disease.                       - Prior to the procedure, a History and Physical was                        performed, and patient medications, allergies and                        sensitivities were reviewed. The patient's tolerance of                        previous anesthesia was reviewed.                       - The risks and benefits of the procedure and the                        sedation options and risks were discussed with the                        patient. All questions were answered and informed                        consent was obtained.                       - Patient identification and proposed procedure were                        verified prior to the procedure by the physician, the                        nurse, the anesthesiologist, the anesthetist and the                        technician. The procedure was verified in the procedure                        room.  After obtaining informed consent, the colonoscope was                        passed under direct vision. Throughout the procedure,                        the patient's blood pressure, pulse, and oxygen                        saturations were monitored continuously. The                     Colonoscope was introduced through the anus and                        advanced to the the terminal ileum. The colonoscopy was                        performed with ease. The patient tolerated the                        procedure well. The quality of the bowel preparation                        was good. Findings:      The perianal and digital rectal examinations were normal.      The terminal ileum appeared normal.      A single small-mouthed diverticulum was found in the ascending colon.      Two sessile polyps were found in the ascending colon. The polyps were 2       to 4 mm in size. These polyps were removed with a cold biopsy forceps.       Resection and retrieval were complete.      Two sessile polyps were found in the sigmoid colon. The polyps were 2 to       3 mm in size. These polyps were removed with a cold biopsy forceps.       Resection and retrieval were complete.      The exam was otherwise without abnormality.      The rectum, sigmoid colon, descending colon, transverse colon, ascending       colon and cecum appeared normal.      Non-bleeding internal hemorrhoids were found during retroflexion. Impression:           - The examined portion of the ileum was normal.                       - Diverticulosis in the ascending colon.                       - Two 2 to 4 mm polyps in the ascending colon, removed                        with a cold biopsy forceps. Resected and retrieved.                       - Two 2 to 3 mm polyps in the sigmoid colon, removed                        with a cold biopsy forceps.  Resected and retrieved.                       - The examination was otherwise normal.                       - The rectum, sigmoid colon, descending colon,                        transverse colon, ascending colon and cecum are normal.                       - Non-bleeding internal hemorrhoids. Recommendation:       - Discharge patient to home (with escort).                        - Advance diet as tolerated.                       - Continue present medications.                       - Await pathology results.                       - Repeat colonoscopy in 5 years for surveillance.                       - The findings and recommendations were discussed with                        the patient.                       - The findings and recommendations were discussed with                        the patient's family.                       - Return to primary care physician as previously                        scheduled.                       - High fiber diet. Procedure Code(s):    --- Professional ---                       [id number], Colonoscopy, flexible; with biopsy, single or                        multiple Diagnosis Code(s):    --- Professional ---                       Z12.11, Encounter for screening for malignant neoplasm                        of colon                       K64.8, Other hemorrhoids  D12.2, Benign neoplasm of ascending colon                       D12.5, Benign neoplasm of sigmoid colon                       K57.30, Diverticulosis of large intestine without                        perforation or abscess without bleeding CPT copyright 2017 American [employer] Association. All rights reserved. The codes documented in this report are preliminary and upon coder review may  be revised to meet current compliance requirements.  [name], MD Margretta Sidle B. Bonna Gains MD, MD 04/05/2018 10:40:05 AM This report has been signed electronically. Number of Addenda: 0 Note Initiated On: 04/05/2018 9:36 AM Scope Withdrawal Time: 0 hours 21 minutes 32 seconds  Total Procedure Duration: 0 hours 31 minutes 31 seconds  Estimated Blood Loss: Estimated blood loss: none.      [facility]

## 2018-04-05 NOTE — Anesthesia Post-op Follow-up Note (Signed)
Anesthesia QCDR form completed.        

## 2018-04-05 NOTE — Anesthesia Preprocedure Evaluation (Signed)
Anesthesia Evaluation  Patient identified by MRN, date of birth, ID band Patient awake    Reviewed: Allergy & Precautions, H&P , NPO status , Patient's Chart, lab work & pertinent test results  History of Anesthesia Complications Negative for: history of anesthetic complications  Airway Mallampati: III  TM Distance: >3 FB Neck ROM: limited    Dental  (+) Edentulous Upper, Edentulous Lower, Upper Dentures, Lower Dentures   Pulmonary neg shortness of breath, former smoker,           Cardiovascular Exercise Tolerance: Good hypertension, (-) angina(-) Past MI and (-) DOE      Neuro/Psych negative neurological ROS  negative psych ROS   GI/Hepatic Neg liver ROS, GERD  Medicated and Controlled,  Endo/Other  negative endocrine ROS  Renal/GU negative Renal ROS  negative genitourinary   Musculoskeletal  (+) Arthritis ,   Abdominal   Peds  Hematology negative hematology ROS (+)   Anesthesia Other Findings Past Medical History: No date: Anemia No date: Back pain No date: BPH (benign prostatic hyperplasia) No date: ED (erectile dysfunction)  Past Surgical History: 01/03/2018: COLONOSCOPY; Left     Comment:  Procedure: COLONOSCOPY;  Surgeon: Virgel Manifold,               MD;  Location: [facility] ENDOSCOPY;  Service: Endoscopy;                Laterality: Left; 01/03/2018: ENTEROSCOPY     Comment:  Procedure: ENTEROSCOPY;  Surgeon: Virgel Manifold,               MD;  Location: [facility] ENDOSCOPY;  Service: Endoscopy;; 01/02/2018: ESOPHAGOGASTRODUODENOSCOPY; N/A     Comment:  Procedure: ESOPHAGOGASTRODUODENOSCOPY (EGD);  Surgeon:               Virgel Manifold, MD;  Location: [facility] ENDOSCOPY;                Service: Endoscopy;  Laterality: N/A; No date: HERNIA REPAIR     Comment:  1980's  BMI    Body Mass Index:  26.50 kg/m      Reproductive/Obstetrics negative OB ROS                              Anesthesia Physical Anesthesia Plan  ASA: III  Anesthesia Plan: General   Post-op Pain Management:    Induction: Intravenous  PONV Risk Score and Plan: Propofol infusion and TIVA  Airway Management Planned: Natural Airway and Nasal Cannula  Additional Equipment:   Intra-op Plan:   Post-operative Plan:   Informed Consent: I have reviewed the patients History and Physical, chart, labs and discussed the procedure including the risks, benefits and alternatives for the proposed anesthesia with the patient or authorized representative who has indicated his/her understanding and acceptance.   Dental Advisory Given  Plan Discussed with: Anesthesiologist, CRNA and Surgeon  Anesthesia Plan Comments: (Patient consented for risks of anesthesia including but not limited to:  - adverse reactions to medications - risk of intubation if required - damage to teeth, lips or other oral mucosa - sore throat or hoarseness - Damage to heart, brain, lungs or loss of life  Patient voiced understanding.)        Anesthesia Quick Evaluation

## 2018-04-05 NOTE — Op Note (Signed)
[facility] Gastroenterology Patient Name: Dwayne Thompson Procedure Date: 04/05/2018 9:37 AM MRN: [id number] Account #: [id number] Date of Birth: March 28, 1946 Admit Type: Outpatient Age: 72 Room: [facility] ENDO ROOM 4 Gender: Male Note Status: Finalized Procedure:            Upper GI endoscopy Indications:          Helicobacter pylori, Follow-up of Helicobacter pylori Providers:            Thayer Embleton B. Bonna Gains MD, MD Referring MD:         Janine Ores. Rosanna Randy, MD (Referring MD) Medicines:            Monitored Anesthesia Care Complications:        No immediate complications. Procedure:            Pre-Anesthesia Assessment:                       - Prior to the procedure, a History and Physical was                        performed, and patient medications, allergies and                        sensitivities were reviewed. The patient's tolerance of                        previous anesthesia was reviewed.                       - The risks and benefits of the procedure and the                        sedation options and risks were discussed with the                        patient. All questions were answered and informed                        consent was obtained.                       - Patient identification and proposed procedure were                        verified prior to the procedure by the physician, the                        nurse, the anesthesiologist, the anesthetist and the                        technician. The procedure was verified in the procedure                        room.                       - ASA Grade Assessment: III - A patient with severe                        systemic disease.  After obtaining informed consent, the endoscope was                        passed under direct vision. Throughout the procedure,                        the patient's blood pressure, pulse, and oxygen                        saturations were monitored  continuously. The Endoscope                        was introduced through the mouth, and advanced to the                        second part of duodenum. The upper GI endoscopy was                        accomplished with ease. The patient tolerated the                        procedure well. Findings:      Two tongues of salmon-colored mucosa were present. No other visible       abnormalities were present. The maximum longitudinal extent of these       esophageal mucosal changes was 1 cm in length. Biopsies were taken with       a cold forceps for histology.      Patchy mildly erythematous mucosa without bleeding was found in the       gastric antrum. Biopsies were taken with a cold forceps for histology.       Biopsies were obtained in the gastric body, at the incisura and in the       gastric antrum with cold forceps for histology.      Patchy mildly erythematous mucosa without active bleeding and with no       stigmata of bleeding was found in the duodenal bulb.      The second portion of the duodenum and examined duodenum were normal. Impression:           - Salmon-colored mucosa suspicious for short-segment                        Barrett's esophagus. Biopsied.                       - Erythematous mucosa in the antrum. Biopsied.                       - Erythematous duodenopathy.                       - Normal second portion of the duodenum and examined                        duodenum.                       - Biopsies were obtained in the gastric body, at the                        incisura and in the gastric antrum. Recommendation:       -  Await pathology results.                       - Discharge patient to home (with escort).                       - Advance diet as tolerated.                       - Continue present medications.                       - Patient has a contact number available for                        emergencies. The signs and symptoms of potential                         delayed complications were discussed with the patient.                        Return to normal activities tomorrow. Written discharge                        instructions were provided to the patient.                       - Discharge patient to home (with escort).                       - The findings and recommendations were discussed with                        the patient.                       - The findings and recommendations were discussed with                        the patient's family. Procedure Code(s):    --- Professional ---                       [phone], Esophagogastroduodenoscopy, flexible, transoral;                        with biopsy, single or multiple Diagnosis Code(s):    --- Professional ---                       K22.8, Other specified diseases of esophagus                       K31.89, Other diseases of stomach and duodenum                       X54.00, Helicobacter pylori [H. pylori] as the cause of                        diseases classified elsewhere CPT copyright 2017 American Medical Association. All rights reserved. The codes documented in this report are preliminary and upon coder review may  be revised to meet current compliance requirements.  [name], MD Margretta Sidle B. Anupama Piehl MD, MD 04/05/2018 9:59:47  AM This report has been signed electronically. Number of Addenda: 0 Note Initiated On: 04/05/2018 9:37 AM Estimated Blood Loss: Estimated blood loss: none.      [facility]

## 2018-04-05 NOTE — Anesthesia Postprocedure Evaluation (Signed)
Anesthesia Post Note  Patient: KAROL LIENDO  Procedure(s) Performed: COLONOSCOPY WITH PROPOFOL (N/A ) ESOPHAGOGASTRODUODENOSCOPY (EGD) WITH PROPOFOL (N/A )  Patient location during evaluation: Endoscopy Anesthesia Type: General Level of consciousness: awake and alert Pain management: pain level controlled Vital Signs Assessment: post-procedure vital signs reviewed and stable Respiratory status: spontaneous breathing, nonlabored ventilation, respiratory function stable and patient connected to nasal cannula oxygen Cardiovascular status: blood pressure returned to baseline and stable Postop Assessment: no apparent nausea or vomiting Anesthetic complications: no     Last Vitals:  Vitals:   04/05/18 1038 04/05/18 1108  BP: 94/66   Pulse: 80 61  Resp: 14   Temp: (!) 36.1 C   SpO2: 94%     Last Pain:  Vitals:   04/05/18 1108  TempSrc:   PainSc: 0-No pain                 Precious Haws Lilias Lorensen

## 2018-04-05 NOTE — Transfer of Care (Signed)
Immediate Anesthesia Transfer of Care Note  Patient: Dwayne Thompson  Procedure(s) Performed: COLONOSCOPY WITH PROPOFOL (N/A ) ESOPHAGOGASTRODUODENOSCOPY (EGD) WITH PROPOFOL (N/A )  Patient Location: PACU  Anesthesia Type:General  Level of Consciousness: sedated  Airway & Oxygen Therapy: Patient Spontanous Breathing and Patient connected to nasal cannula oxygen  Post-op Assessment: Report given to RN and Post -op Vital signs reviewed and stable  Post vital signs: Reviewed and stable  Last Vitals:  Vitals Value Taken Time  BP    Temp    Pulse 81 04/05/2018 10:38 AM  Resp 13 04/05/2018 10:38 AM  SpO2 95 % 04/05/2018 10:38 AM  Vitals shown include unvalidated device data.  Last Pain:  Vitals:   04/05/18 0914  TempSrc: Tympanic  PainSc: 0-No pain         Complications: No apparent anesthesia complications

## 2018-04-06 ENCOUNTER — Encounter: Payer: Self-pay | Admitting: Gastroenterology

## 2018-04-09 ENCOUNTER — Other Ambulatory Visit: Payer: Self-pay | Admitting: Family Medicine

## 2018-04-09 LAB — SURGICAL PATHOLOGY

## 2018-04-17 ENCOUNTER — Ambulatory Visit (INDEPENDENT_AMBULATORY_CARE_PROVIDER_SITE_OTHER): Payer: Medicare Other

## 2018-04-17 ENCOUNTER — Ambulatory Visit (INDEPENDENT_AMBULATORY_CARE_PROVIDER_SITE_OTHER): Payer: Medicare Other | Admitting: Family Medicine

## 2018-04-17 VITALS — BP 122/54 | HR 60 | Temp 97.6°F | Resp 16 | Ht 71.5 in | Wt 190.0 lb

## 2018-04-17 VITALS — BP 122/54 | HR 60 | Temp 97.6°F | Ht 72.0 in | Wt 190.2 lb

## 2018-04-17 DIAGNOSIS — Z0000 Encounter for general adult medical examination without abnormal findings: Secondary | ICD-10-CM

## 2018-04-17 NOTE — Progress Notes (Signed)
Subjective:   Dwayne Thompson is a 72 y.o. male who presents for Medicare Annual/Subsequent preventive examination.  Review of Systems:  N/A  Cardiac Risk Factors include: advanced age (>88men, >67 women);dyslipidemia;male gender;sedentary lifestyle     Objective:    Vitals: BP (!) 122/54 (BP Location: Right Arm)   Pulse 60   Temp 97.6 F (36.4 C) (Oral)   Ht 6' (1.829 m)   Wt 190 lb 3.2 oz (86.3 kg)   BMI 25.80 kg/m   Body mass index is 25.8 kg/m.  Advanced Directives 04/17/2018 04/05/2018 02/14/2018 01/10/2018 01/03/2018 01/02/2018 01/01/2018  Does Patient Have a Medical Advance Directive? No Yes;No No No No No No  Type of Advance Directive - - - - - - -  Would patient like information on creating a medical advance directive? No - Patient declined - - - No - Patient declined No - Patient declined No - Patient declined    Tobacco Social History   Tobacco Use  Smoking Status Former Smoker  . Packs/day: 1.00  . Years: 50.00  . Pack years: 50.00  . Types: Cigarettes  . Last attempt to quit: 10/20/2013  . Years since quitting: 4.4  Smokeless Tobacco Never Used     Counseling given: Not Answered   Clinical Intake:  Pre-visit preparation completed: Yes  Pain : No/denies pain Pain Score: 0-No pain     Nutritional Status: BMI 25 -29 Overweight Nutritional Risks: None Diabetes: No  How often do you need to have someone help you when you read instructions, pamphlets, or other written materials from your doctor or pharmacy?: 1 - Never  Interpreter Needed?: No  Information entered by :: [facility], LPN  Past Medical History:  Diagnosis Date  . Anemia   . Back pain   . BPH (benign prostatic hyperplasia)   . ED (erectile dysfunction)   . Hyperlipidemia    Past Surgical History:  Procedure Laterality Date  . COLONOSCOPY Left 01/03/2018   Procedure: COLONOSCOPY;  Surgeon: Virgel Manifold, MD;  Location: [facility] ENDOSCOPY;  Service: Endoscopy;  Laterality: Left;  .  COLONOSCOPY WITH PROPOFOL N/A 04/05/2018   Procedure: COLONOSCOPY WITH PROPOFOL;  Surgeon: Virgel Manifold, MD;  Location: [facility] ENDOSCOPY;  Service: Endoscopy;  Laterality: N/A;  . ENTEROSCOPY  01/03/2018   Procedure: ENTEROSCOPY;  Surgeon: Virgel Manifold, MD;  Location: [facility] ENDOSCOPY;  Service: Endoscopy;;  . ESOPHAGOGASTRODUODENOSCOPY N/A 01/02/2018   Procedure: ESOPHAGOGASTRODUODENOSCOPY (EGD);  Surgeon: Virgel Manifold, MD;  Location: [facility] ENDOSCOPY;  Service: Endoscopy;  Laterality: N/A;  . ESOPHAGOGASTRODUODENOSCOPY (EGD) WITH PROPOFOL N/A 04/05/2018   Procedure: ESOPHAGOGASTRODUODENOSCOPY (EGD) WITH PROPOFOL;  Surgeon: Virgel Manifold, MD;  Location: [facility] ENDOSCOPY;  Service: Endoscopy;  Laterality: N/A;  . HERNIA REPAIR     1980's   Family History  Problem Relation Age of Onset  . Alzheimer's disease Mother   . Sarcoidosis Sister   . Diabetes Brother   . Hypertension Brother   . Anxiety disorder Brother   . Kidney disease Neg Hx   . Prostate cancer Neg Hx    Social History   Socioeconomic History  . Marital status: Married    Spouse name: [name]  . Number of children: 3  . Years of education: 4  . Highest education level: 12th grade  Occupational History  . Occupation: retired  Scientific laboratory technician  . Financial resource strain: Not hard at all  . Food insecurity:    Worry: Never true    Inability: Never true  .  Transportation needs:    Medical: No    Non-medical: No  Tobacco Use  . Smoking status: Former Smoker    Packs/day: 1.00    Years: 50.00    Pack years: 50.00    Types: Cigarettes    Last attempt to quit: 10/20/2013    Years since quitting: 4.4  . Smokeless tobacco: Never Used  Substance and Sexual Activity  . Alcohol use: Yes    Alcohol/week: 12.6 - 16.8 oz    Types: 21 - 28 Shots of liquor per week    Comment: 3-4 gin and sodas a day  . Drug use: No  . Sexual activity: Not Currently  Lifestyle  . Physical activity:    Days per week:  Not on file    Minutes per session: Not on file  . Stress: Not at all  Relationships  . Social connections:    Talks on phone: Not on file    Gets together: Not on file    Attends religious service: Not on file    Active member of club or organization: Not on file    Attends meetings of clubs or organizations: Not on file    Relationship status: Not on file  Other Topics Concern  . Not on file  Social History Narrative  . Not on file    Outpatient Encounter Medications as of 04/17/2018  Medication Sig  . acetaminophen (TYLENOL) 325 MG tablet Take 2 tablets (650 mg total) by mouth 3 (three) times daily.  Marland Kitchen aspirin EC 81 MG EC tablet Take 1 tablet (81 mg total) by mouth daily.  . Cholecalciferol 1000 UNITS tablet Take 1,000 Units by mouth daily. Reported on 03/03/2016  . clotrimazole-betamethasone (LOTRISONE) cream Apply 1 application topically 2 (two) times daily.  Marland Kitchen doxazosin (CARDURA) 4 MG tablet TAKE 1 TABLET DAILY  . folic acid (FOLVITE) 1 MG tablet Take 1 tablet (1 mg total) by mouth daily.  Marland Kitchen gabapentin (NEURONTIN) 100 MG capsule Take 1 capsule (100 mg total) by mouth 2 (two) times daily.  Marland Kitchen gabapentin (NEURONTIN) 300 MG capsule Take 1 capsule (300 mg total) by mouth at bedtime.  Marland Kitchen lisinopril (PRINIVIL,ZESTRIL) 10 MG tablet Take 1 tablet (10 mg total) by mouth daily.  . pantoprazole sodium (PROTONIX) 40 mg/20 mL PACK 20mg  po bid  . potassium chloride (K-DUR) 10 MEQ tablet Take 1 tablet (10 mEq total) by mouth 2 (two) times daily.  . rosuvastatin (CRESTOR) 10 MG tablet Take 1 tablet (10 mg total) by mouth daily.  . vitamin B-12 1000 MCG tablet Take 1 tablet (1,000 mcg total) by mouth daily.   No facility-administered encounter medications on file as of 04/17/2018.     Activities of Daily Living In your present state of health, do you have any difficulty performing the following activities: 04/17/2018 01/01/2018  Hearing? N N  Vision? N N  Difficulty concentrating or making  decisions? N N  Walking or climbing stairs? N N  Dressing or bathing? N N  Doing errands, shopping? N N  Preparing Food and eating ? N -  Using the Toilet? N -  In the past six months, have you accidently leaked urine? N -  Do you have problems with loss of bowel control? N -  Managing your Medications? N -  Managing your Finances? N -  Housekeeping or managing your Housekeeping? N -  Some recent data might be hidden    Patient Care Team: Jerrol Banana., MD as PCP - General (  Family Medicine) Virgel Manifold, MD as Consulting Physician (Gastroenterology) Lequita Asal, MD as Referring Physician (Hematology and Oncology)   Assessment:   This is a routine wellness examination for [facility].  Exercise Activities and Dietary recommendations Current Exercise Habits: The patient does not participate in regular exercise at present, Exercise limited by: None identified  Goals    . DIET - INCREASE WATER INTAKE     Recommend increasing water intake to 3 glasses a day.       Fall Risk Fall Risk  04/17/2018 12/04/2017 11/28/2016 04/29/2015  Falls in the past year? No No No No   Is the patient's home free of loose throw rugs in walkways, pet beds, electrical cords, etc?   yes      Grab bars in the bathroom? no      Handrails on the stairs?   no      Adequate lighting?   yes  Timed Get Up and Go Performed: N/A  Depression Screen PHQ 2/9 Scores 04/17/2018 12/04/2017 11/28/2016 04/29/2015  PHQ - 2 Score 0 0 0 0  PHQ- 9 Score - 0 0 -    Cognitive Function: Pt declined screening today.      6CIT Screen 11/28/2016  What Year? 0 points  What month? 0 points  What time? 0 points  Count back from 20 0 points  Months in reverse 2 points  Repeat phrase 4 points  Total Score 6    Immunization History  Administered Date(s) Administered  . Influenza, High Dose Seasonal PF 08/19/2015, 09/02/2016, 10/03/2017  . Pneumococcal Conjugate-13 02/06/2014  . Pneumococcal  Polysaccharide-23 03/05/2012  . Tdap 04/28/2007    Qualifies for Shingles Vaccine? Due for Shingles vaccine. Declined my offer to administer today. Education has been provided regarding the importance of this vaccine. Pt has been advised to call her insurance company to determine her out of pocket expense. Advised she may also receive this vaccine at her local pharmacy or Health Dept. Verbalized acceptance and understanding.  Screening Tests Health Maintenance  Topic Date Due  . TETANUS/TDAP  04/27/2017  . INFLUENZA VACCINE  04/19/2018  . COLONOSCOPY  04/06/2023  . Hepatitis C Screening  Completed  . PNA vac Low Risk Adult  Completed   Cancer Screenings: Lung: Low Dose CT Chest recommended if Age 35-80 years, 30 pack-year currently smoking OR have quit w/in 15years. Patient does qualify, however completed a scan in 08/2017. Colorectal: Up to date  Additional Screenings:  Hepatitis C Screening: Up to date      Plan:  I have personally reviewed and addressed the Medicare Annual Wellness questionnaire and have noted the following in the patient's chart:  A. Medical and social history B. Use of alcohol, tobacco or illicit drugs  C. Current medications and supplements D. Functional ability and status E.  Nutritional status F.  Physical activity G. Advance directives H. List of other physicians I.  Hospitalizations, surgeries, and ER visits in previous 12 months J.  [place] such as hearing and vision if needed, cognitive and depression L. Referrals and appointments - none  In addition, I have reviewed and discussed with patient certain preventive protocols, quality metrics, and best practice recommendations. A written personalized care plan for preventive services as well as general preventive health recommendations were provided to patient.  See attached scanned questionnaire for additional information.   Signed,  Fabio Neighbors, LPN Nurse Health  Advisor   Nurse Recommendations: Pt declined the tetanus vaccine today.

## 2018-04-17 NOTE — Progress Notes (Signed)
Patient: Dwayne Thompson, Male    DOB: 02-Jun-1946, 72 y.o.   MRN: [id number] Visit Date: 04/17/2018  Today's Provider: [name], MD   Chief Complaint  Patient presents with  . Annual Exam   Subjective:  Dwayne Thompson is a 72 y.o. male who presents today for health maintenance and complete physical. He feels well. He reports exercising none. He reports he is sleeping well.  04/05/18 Colonoscopy  Review of Systems  Constitutional: Positive for fatigue.  HENT: Negative.   Eyes: Negative.   Respiratory: Positive for apnea.   Cardiovascular: Negative.   Gastrointestinal: Negative.   Endocrine: Negative.   Genitourinary: Negative.   Musculoskeletal: Negative.   Skin: Negative.   Allergic/Immunologic: Negative.   Neurological: Negative.   Hematological: Negative.   Psychiatric/Behavioral: Negative.     Social History   Socioeconomic History  . Marital status: Married    Spouse name: [name]  . Number of children: 3  . Years of education: 66  . Highest education level: 12th grade  Occupational History  . Occupation: retired  Scientific laboratory technician  . Financial resource strain: Not hard at all  . Food insecurity:    Worry: Never true    Inability: Never true  . Transportation needs:    Medical: No    Non-medical: No  Tobacco Use  . Smoking status: Former Smoker    Packs/day: 1.00    Years: 50.00    Pack years: 50.00    Types: Cigarettes    Last attempt to quit: 10/20/2013    Years since quitting: 4.4  . Smokeless tobacco: Never Used  Substance and Sexual Activity  . Alcohol use: Yes    Alcohol/week: 12.6 - 16.8 oz    Types: 21 - 28 Shots of liquor per week    Comment: 3-4 gin and sodas a day  . Drug use: No  . Sexual activity: Not Currently  Lifestyle  . Physical activity:    Days per week: Not on file    Minutes per session: Not on file  . Stress: Not at all  Relationships  . Social connections:    Talks on phone: Not on file    Gets together: Not on file     Attends religious service: Not on file    Active member of club or organization: Not on file    Attends meetings of clubs or organizations: Not on file    Relationship status: Not on file  . Intimate partner violence:    Fear of current or ex partner: Not on file    Emotionally abused: Not on file    Physically abused: Not on file    Forced sexual activity: Not on file  Other Topics Concern  . Not on file  Social History Narrative  . Not on file    Patient Active Problem List   Diagnosis Date Noted  . CLE (columnar lined esophagus)   . Special screening for malignant neoplasms, colon   . Benign neoplasm of ascending colon   . Polyp of sigmoid colon   . Diverticulosis of large intestine without diverticulitis   . Iron deficiency anemia due to chronic blood loss 02/18/2018  . Folate deficiency 02/14/2018  . Anemia due to vitamin B12 deficiency 01/10/2018  . Anemia due to folic acid deficiency 40/98/1191  . 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 Helicobacter pylori gastritis (chronic gastritis) 01/10/2018  . Thrombocytopenia ([place]) 01/10/2018  . Internal hemorrhoids   . Acute  gastrointestinal hemorrhage   . Stomach irritation   . Duodenal nodule   . Melena 01/01/2018  . Neck pain on right side 03/23/2016  . History of tobacco abuse 03/23/2016  . Cervical lymphadenitis 03/23/2016  . 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 Allergic rhinitis 03/28/2015  . 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 Failure of erection 03/28/2015  . Acid reflux 03/28/2015  . Arthritis, degenerative 03/28/2015  . Change in blood platelet count 03/28/2015  . Temporary cerebral vascular dysfunction 03/28/2015  . Personal history of tobacco use, presenting hazards to health 03/28/2015    Past Surgical History:  Procedure Laterality Date   . COLONOSCOPY Left 01/03/2018   Procedure: COLONOSCOPY;  Surgeon: Virgel Manifold, MD;  Location: [facility] ENDOSCOPY;  Service: Endoscopy;  Laterality: Left;  . COLONOSCOPY WITH PROPOFOL N/A 04/05/2018   Procedure: COLONOSCOPY WITH PROPOFOL;  Surgeon: Virgel Manifold, MD;  Location: [facility] ENDOSCOPY;  Service: Endoscopy;  Laterality: N/A;  . ENTEROSCOPY  01/03/2018   Procedure: ENTEROSCOPY;  Surgeon: Virgel Manifold, MD;  Location: [place] ENDOSCOPY;  Service: Endoscopy;;  . ESOPHAGOGASTRODUODENOSCOPY N/A 01/02/2018   Procedure: ESOPHAGOGASTRODUODENOSCOPY (EGD);  Surgeon: Virgel Manifold, MD;  Location[facility] ENDOSCOPY;  Service: Endoscopy;  Laterality: N/A;  . ESOPHAGOGASTRODUODENOSCOPY (EGD) WITH PROPOFOL N/A 04/05/2018   Procedure: ESOPHAGOGASTRODUODENOSCOPY (EGD) WITH PROPOFOL;  Surgeon: Virgel Manifold, MD;  Location: [facility] ENDOSCOPY;  Service: Endoscopy;  Laterality: N/A;  . HERNIA REPAIR     1980's    His family history includes Alzheimer's disease in his mother; Anxiety disorder in his brother; Diabetes in his brother; Hypertension in his brother; Sarcoidosis in his sister.     Outpatient Encounter Medications as of 04/17/2018  Medication Sig  . acetaminophen (TYLENOL) 325 MG tablet Take 2 tablets (650 mg total) by mouth 3 (three) times daily.  Marland Kitchen aspirin EC 81 MG EC tablet Take 1 tablet (81 mg total) by mouth daily.  . Cholecalciferol 1000 UNITS tablet Take 1,000 Units by mouth daily. Reported on 03/03/2016  . clotrimazole-betamethasone (LOTRISONE) cream Apply 1 application topically 2 (two) times daily.  Marland Kitchen doxazosin (CARDURA) 4 MG tablet TAKE 1 TABLET DAILY  . folic acid (FOLVITE) 1 MG tablet Take 1 tablet (1 mg total) by mouth daily.  Marland Kitchen gabapentin (NEURONTIN) 100 MG capsule Take 1 capsule (100 mg total) by mouth 2 (two) times daily.  Marland Kitchen gabapentin (NEURONTIN) 300 MG capsule Take 1 capsule (300 mg total) by mouth at bedtime.  Marland Kitchen lisinopril (PRINIVIL,ZESTRIL) 10 MG tablet  Take 1 tablet (10 mg total) by mouth daily.  . pantoprazole sodium (PROTONIX) 40 mg/20 mL PACK 20mg  po bid  . potassium chloride (K-DUR) 10 MEQ tablet Take 1 tablet (10 mEq total) by mouth 2 (two) times daily.  . rosuvastatin (CRESTOR) 10 MG tablet Take 1 tablet (10 mg total) by mouth daily.  . vitamin B-12 1000 MCG tablet Take 1 tablet (1,000 mcg total) by mouth daily.   No facility-administered encounter medications on file as of 04/17/2018.     Patient Care Team: Jerrol Banana., MD as PCP - General (Family Medicine) Virgel Manifold, MD as Consulting Physician (Gastroenterology) Lequita Asal, MD as Referring Physician (Hematology and Oncology)      Objective:   Vitals:  Vitals:   04/17/18 0937  BP: (!) 122/54  Pulse: 60  Resp: 16  Temp: 97.6 F (36.4 C)  TempSrc: Oral  Weight: 190 lb (86.2 kg)  Height: 5' 11.5" (1.816 m)    Physical Exam  Constitutional: He is oriented to person, place, and time. He appears well-developed and well-nourished.  HENT:  Head: Normocephalic and atraumatic.  Right Ear: External ear normal.  Left Ear: External ear normal.  Nose: Nose normal.  Mouth/Throat: Oropharynx is clear and moist.  Eyes: Pupils are equal, round, and reactive to light. Conjunctivae and EOM are normal.  Neck: Normal range of motion. Neck supple.  Cardiovascular: Normal rate, regular rhythm, normal heart sounds and intact distal pulses.  Pulmonary/Chest: Effort normal and breath sounds normal.  Abdominal: Bowel sounds are normal.  Genitourinary: Rectum normal, prostate normal and penis normal.  Musculoskeletal: Normal range of motion.  Neurological: He is alert and oriented to person, place, and time.  Skin: Skin is warm and dry.  Psychiatric: He has a normal mood and affect. His behavior is normal. Judgment and thought content normal.     Depression Screen PHQ 2/9 Scores 04/17/2018 12/04/2017 11/28/2016 04/29/2015  PHQ - 2 Score 0 0 0 0  PHQ- 9  Score - 0 0 -      Assessment & Plan:     Routine Health Maintenance and Physical Exam  Exercise Activities and Dietary recommendations Goals    . DIET - INCREASE WATER INTAKE     Recommend increasing water intake to 3 glasses a day.       Immunization History  Administered Date(s) Administered  . Influenza, High Dose Seasonal PF 08/19/2015, 09/02/2016, 10/03/2017  . Pneumococcal Conjugate-13 02/06/2014  . Pneumococcal Polysaccharide-23 03/05/2012  . Tdap 04/28/2007    Health Maintenance  Topic Date Due  . TETANUS/TDAP  04/27/2017  . INFLUENZA VACCINE  04/19/2018  . COLONOSCOPY  04/06/2023  . Hepatitis C Screening  Completed  . PNA vac Low Risk Adult  Completed     Discussed health benefits of physical activity, and encouraged him to engage in regular exercise appropriate for his age and condition.  Possible OSA Consider sleep study.[place] is 5.   I have done the exam and reviewed the chart and it is accurate to the best of my knowledge. Development worker, community has been used and  any errors in dictation or transcription are unintentional. [name] M.D. [facility]

## 2018-04-17 NOTE — Patient Instructions (Addendum)
Dwayne Thompson , Thank you for taking time to come for your Medicare Wellness Visit. I appreciate your ongoing commitment to your health goals. Please review the following plan we discussed and let me know if I can assist you in the future.   Screening recommendations/referrals: Colonoscopy: Up to date Recommended yearly ophthalmology/optometry visit for glaucoma screening and checkup Recommended yearly dental visit for hygiene and checkup  Vaccinations: Influenza vaccine: Up to date Pneumococcal vaccine: Up to date Tdap vaccine: Pt declines today.  Shingles vaccine: Pt declines today.     Advanced directives: Advance directive discussed with you today. Even though you declined this today please call our office should you change your mind and we can give you the proper paperwork for you to fill out.  Conditions/risks identified: Recommend increasing water intake to 3 glasses a day.  Next appointment: 9:40 AM today with Dr Rosanna Randy.  Preventive Care 9 Years and Older, Male Preventive care refers to lifestyle choices and visits with your health care provider that can promote health and wellness. What does preventive care include?  A yearly physical exam. This is also called an annual well check.  Dental exams once or twice a year.  Routine eye exams. Ask your health care provider how often you should have your eyes checked.  Personal lifestyle choices, including:  Daily care of your teeth and gums.  Regular physical activity.  Eating a healthy diet.  Avoiding tobacco and drug use.  Limiting alcohol use.  Practicing safe sex.  Taking low doses of aspirin every day.  Taking vitamin and mineral supplements as recommended by your health care provider. What happens during an annual well check? The services and screenings done by your health care provider during your annual well check will depend on your age, overall health, lifestyle risk factors, and family history of  disease. Counseling  Your health care provider may ask you questions about your:  Alcohol use.  Tobacco use.  Drug use.  Emotional well-being.  Home and relationship well-being.  Sexual activity.  Eating habits.  History of falls.  Memory and ability to understand (cognition).  Work and work Statistician. Screening  You may have the following tests or measurements:  Height, weight, and BMI.  Blood pressure.  Lipid and cholesterol levels. These may be checked every 5 years, or more frequently if you are over 79 years old.  Skin check.  Lung cancer screening. You may have this screening every year starting at age 66 if you have a 30-pack-year history of smoking and currently smoke or have quit within the past 15 years.  Fecal occult blood test (FOBT) of the stool. You may have this test every year starting at age 16.  Flexible sigmoidoscopy or colonoscopy. You may have a sigmoidoscopy every 5 years or a colonoscopy every 10 years starting at age 63.  Prostate cancer screening. Recommendations will vary depending on your family history and other risks.  Hepatitis C blood test.  Hepatitis B blood test.  Sexually transmitted disease (STD) testing.  Diabetes screening. This is done by checking your blood sugar (glucose) after you have not eaten for a while (fasting). You may have this done every 1-3 years.  Abdominal aortic aneurysm (AAA) screening. You may need this if you are a current or former smoker.  Osteoporosis. You may be screened starting at age 68 if you are at high risk. Talk with your health care provider about your test results, treatment options, and if necessary, the need for  more tests. Vaccines  Your health care provider may recommend certain vaccines, such as:  Influenza vaccine. This is recommended every year.  Tetanus, diphtheria, and acellular pertussis (Tdap, Td) vaccine. You may need a Td booster every 10 years.  Zoster vaccine. You may  need this after age 24.  Pneumococcal 13-valent conjugate (PCV13) vaccine. One dose is recommended after age 43.  Pneumococcal polysaccharide (PPSV23) vaccine. One dose is recommended after age 47. Talk to your health care provider about which screenings and vaccines you need and how often you need them. This information is not intended to replace advice given to you by your health care provider. Make sure you discuss any questions you have with your health care provider. Document Released: 10/02/2015 Document Revised: 05/25/2016 Document Reviewed: 07/07/2015 Elsevier Interactive Patient Education  2017 [place] Prevention in the Home Falls can cause injuries. They can happen to people of all ages. There are many things you can do to make your home safe and to help prevent falls. What can I do on the outside of my home?  Regularly fix the edges of walkways and driveways and fix any cracks.  Remove anything that might make you trip as you walk through a door, such as a raised step or threshold.  Trim any bushes or trees on the path to your home.  Use bright outdoor lighting.  Clear any walking paths of anything that might make someone trip, such as rocks or tools.  Regularly check to see if handrails are loose or broken. Make sure that both sides of any steps have handrails.  Any raised decks and porches should have guardrails on the edges.  Have any leaves, snow, or ice cleared regularly.  Use sand or salt on walking paths during winter.  Clean up any spills in your garage right away. This includes oil or grease spills. What can I do in the bathroom?  Use night lights.  Install grab bars by the toilet and in the tub and shower. Do not use towel bars as grab bars.  Use non-skid mats or decals in the tub or shower.  If you need to sit down in the shower, use a plastic, non-slip stool.  Keep the floor dry. Clean up any water that spills on the floor as soon as it  happens.  Remove soap buildup in the tub or shower regularly.  Attach bath mats securely with double-sided non-slip rug tape.  Do not have throw rugs and other things on the floor that can make you trip. What can I do in the bedroom?  Use night lights.  Make sure that you have a light by your bed that is easy to reach.  Do not use any sheets or blankets that are too big for your bed. They should not hang down onto the floor.  Have a firm chair that has side arms. You can use this for support while you get dressed.  Do not have throw rugs and other things on the floor that can make you trip. What can I do in the kitchen?  Clean up any spills right away.  Avoid walking on wet floors.  Keep items that you use a lot in easy-to-reach places.  If you need to reach something above you, use a strong step stool that has a grab bar.  Keep electrical cords out of the way.  Do not use floor polish or wax that makes floors slippery. If you must use wax, use non-skid  floor wax.  Do not have throw rugs and other things on the floor that can make you trip. What can I do with my stairs?  Do not leave any items on the stairs.  Make sure that there are handrails on both sides of the stairs and use them. Fix handrails that are broken or loose. Make sure that handrails are as long as the stairways.  Check any carpeting to make sure that it is firmly attached to the stairs. Fix any carpet that is loose or worn.  Avoid having throw rugs at the top or bottom of the stairs. If you do have throw rugs, attach them to the floor with carpet tape.  Make sure that you have a light switch at the top of the stairs and the bottom of the stairs. If you do not have them, ask someone to add them for you. What else can I do to help prevent falls?  Wear shoes that:  Do not have high heels.  Have rubber bottoms.  Are comfortable and fit you well.  Are closed at the toe. Do not wear sandals.  If you  use a stepladder:  Make sure that it is fully opened. Do not climb a closed stepladder.  Make sure that both sides of the stepladder are locked into place.  Ask someone to hold it for you, if possible.  Clearly mark and make sure that you can see:  Any grab bars or handrails.  First and last steps.  Where the edge of each step is.  Use tools that help you move around (mobility aids) if they are needed. These include:  Canes.  Walkers.  Scooters.  Crutches.  Turn on the lights when you go into a dark area. Replace any light bulbs as soon as they burn out.  Set up your furniture so you have a clear path. Avoid moving your furniture around.  If any of your floors are uneven, fix them.  If there are any pets around you, be aware of where they are.  Review your medicines with your doctor. Some medicines can make you feel dizzy. This can increase your chance of falling. Ask your doctor what other things that you can do to help prevent falls. This information is not intended to replace advice given to you by your health care provider. Make sure you discuss any questions you have with your health care provider. Document Released: 07/02/2009 Document Revised: 02/11/2016 Document Reviewed: 10/10/2014 Elsevier Interactive Patient Education  2017 [employer].

## 2018-05-10 ENCOUNTER — Ambulatory Visit: Payer: Self-pay | Admitting: Family Medicine

## 2018-05-15 ENCOUNTER — Ambulatory Visit (INDEPENDENT_AMBULATORY_CARE_PROVIDER_SITE_OTHER): Payer: Medicare Other | Admitting: Family Medicine

## 2018-05-15 ENCOUNTER — Encounter: Payer: Self-pay | Admitting: Family Medicine

## 2018-05-15 VITALS — BP 124/78 | HR 70 | Temp 98.3°F

## 2018-05-15 DIAGNOSIS — L508 Other urticaria: Secondary | ICD-10-CM

## 2018-05-15 DIAGNOSIS — B880 Other acariasis: Secondary | ICD-10-CM

## 2018-05-15 MED ORDER — CETIRIZINE HCL 10 MG PO TABS: 10.0000 mg | ORAL_TABLET | Freq: Every day | ORAL | 0 refills | Status: DC

## 2018-05-15 MED ORDER — VALACYCLOVIR HCL 1 G PO TABS: 1000.0000 mg | ORAL_TABLET | Freq: Three times a day (TID) | ORAL | 0 refills | Status: DC

## 2018-05-15 NOTE — Progress Notes (Signed)
Patient: Dwayne Thompson Male    DOB: 06/04/1946   72 y.o.   MRN: [id number] Visit Date: 05/15/2018  Today's Provider: [name], MD   Chief Complaint  Patient presents with  . Rash   Subjective:    Rash  This is a new problem. The current episode started 1 to 4 weeks ago (Pt reports starting with two bumps on is upper left thigh over a month ago.  He states it has been spreading since yesterday.). The problem has been gradually worsening (Especially today) since onset. The rash is diffuse. The rash is characterized by blistering, burning and itchiness. He was exposed to nothing. Associated symptoms include rhinorrhea. Pertinent negatives include no fatigue or fever.       Allergies  Allergen Reactions  . Tamsulosin Hcl Hives  . Sulfa Antibiotics Rash     Current Outpatient Medications:  .  acetaminophen (TYLENOL) 325 MG tablet, Take 2 tablets (650 mg total) by mouth 3 (three) times daily., Disp: 180 tablet, Rfl: 0 .  Cholecalciferol 1000 UNITS tablet, Take 1,000 Units by mouth daily. Reported on 03/03/2016, Disp: , Rfl:  .  clotrimazole-betamethasone (LOTRISONE) cream, Apply 1 application topically 2 (two) times daily., Disp: 30 g, Rfl: 0 .  folic acid (FOLVITE) 1 MG tablet, Take 1 tablet (1 mg total) by mouth daily., Disp: 180 tablet, Rfl: 0 .  vitamin B-12 1000 MCG tablet, Take 1 tablet (1,000 mcg total) by mouth daily., Disp: 180 tablet, Rfl: 1 .  aspirin EC 81 MG EC tablet, Take 1 tablet (81 mg total) by mouth daily. (Patient not taking: Reported on 05/15/2018), Disp: 180 tablet, Rfl: 0 .  doxazosin (CARDURA) 4 MG tablet, TAKE 1 TABLET DAILY, Disp: 90 tablet, Rfl: 3 .  gabapentin (NEURONTIN) 100 MG capsule, Take 1 capsule (100 mg total) by mouth 2 (two) times daily. (Patient not taking: Reported on 05/15/2018), Disp: 90 capsule, Rfl: 2 .  gabapentin (NEURONTIN) 300 MG capsule, Take 1 capsule (300 mg total) by mouth at bedtime. (Patient not taking: Reported on  05/15/2018), Disp: 90 capsule, Rfl: 2 .  lisinopril (PRINIVIL,ZESTRIL) 10 MG tablet, Take 1 tablet (10 mg total) by mouth daily., Disp: 90 tablet, Rfl: 3 .  pantoprazole sodium (PROTONIX) 40 mg/20 mL PACK, 20mg  po bid (Patient not taking: Reported on 05/15/2018), Disp: 120 each, Rfl: 0 .  potassium chloride (K-DUR) 10 MEQ tablet, Take 1 tablet (10 mEq total) by mouth 2 (two) times daily. (Patient not taking: Reported on 05/15/2018), Disp: 60 tablet, Rfl: 3 .  rosuvastatin (CRESTOR) 10 MG tablet, Take 1 tablet (10 mg total) by mouth daily. (Patient not taking: Reported on 05/15/2018), Disp: 90 tablet, Rfl: 3  Review of Systems  Constitutional: Positive for chills and diaphoresis. Negative for activity change, appetite change, fatigue, fever and unexpected weight change.  HENT: Positive for postnasal drip and rhinorrhea.   Respiratory: Negative.   Cardiovascular: Negative.   Musculoskeletal: Negative.   Skin: Positive for rash. Negative for color change, pallor and wound.  Allergic/Immunologic: Negative.   Hematological: Does not bruise/bleed easily.  Psychiatric/Behavioral: Negative.     Social History   Tobacco Use  . Smoking status: Former Smoker    Packs/day: 1.00    Years: 50.00    Pack years: 50.00    Types: Cigarettes    Last attempt to quit: 10/20/2013    Years since quitting: 4.5  . Smokeless tobacco: Never Used  Substance Use Topics  . Alcohol  use: Yes    Alcohol/week: 21.0 - 28.0 standard drinks    Types: 21 - 28 Shots of liquor per week    Comment: 3-4 gin and sodas a day   Objective:   BP 124/78 (BP Location: Right Arm, Patient Position: Sitting, Cuff Size: Normal)   Pulse 70   Temp 98.3 F (36.8 C) (Oral)   SpO2 95%  Vitals:   05/15/18 1439  BP: 124/78  Pulse: 70  Temp: 98.3 F (36.8 C)  TempSrc: Oral  SpO2: 95%     Physical Exam  Constitutional: He is oriented to person, place, and time. He appears well-developed and well-nourished.  HENT:  Head:  Normocephalic and atraumatic.  Eyes: Conjunctivae are normal. No scleral icterus.  Neck: No thyromegaly present.  Cardiovascular: Normal rate, regular rhythm and normal heart sounds.  Pulmonary/Chest: Effort normal and breath sounds normal.  Abdominal: Soft.  Musculoskeletal: He exhibits no edema.  Lymphadenopathy:    He has no cervical adenopathy.  Neurological: He is alert and oriented to person, place, and time.  Skin: Skin is warm and dry. Rash noted.  Raised areas on legs with central pinpoint c/w bit. Ankles looks like chiggers.  Rash extends to buttocks.  Psychiatric: He has a normal mood and affect. His behavior is normal. Judgment and thought content normal.        Assessment & Plan:     1. Urticaria, acute   2. Chiggers RTC next week to recheck.     I have done the exam and reviewed the chart and it is accurate to the best of my knowledge. Development worker, community has been used and  any errors in dictation or transcription are unintentional. [name] M.D. [place], MD  [facility]

## 2018-05-15 NOTE — Patient Instructions (Signed)
Benadryl every four hours.

## 2018-05-16 ENCOUNTER — Inpatient Hospital Stay: Payer: Medicare Other

## 2018-05-16 ENCOUNTER — Inpatient Hospital Stay: Payer: Medicare Other | Attending: Hematology and Oncology | Admitting: Hematology and Oncology

## 2018-05-16 ENCOUNTER — Encounter: Payer: Self-pay | Admitting: Hematology and Oncology

## 2018-05-16 ENCOUNTER — Other Ambulatory Visit: Payer: Self-pay | Admitting: Hematology and Oncology

## 2018-05-16 VITALS — BP 125/80 | HR 64 | Temp 96.0°F | Resp 8 | Wt 196.0 lb

## 2018-05-16 DIAGNOSIS — D649 Anemia, unspecified: Secondary | ICD-10-CM | POA: Diagnosis not present

## 2018-05-16 DIAGNOSIS — D500 Iron deficiency anemia secondary to blood loss (chronic): Secondary | ICD-10-CM

## 2018-05-16 DIAGNOSIS — D696 Thrombocytopenia, unspecified: Secondary | ICD-10-CM | POA: Diagnosis not present

## 2018-05-16 DIAGNOSIS — D529 Folate deficiency anemia, unspecified: Secondary | ICD-10-CM

## 2018-05-16 DIAGNOSIS — Q2733 Arteriovenous malformation of digestive system vessel: Secondary | ICD-10-CM

## 2018-05-16 DIAGNOSIS — E538 Deficiency of other specified B group vitamins: Secondary | ICD-10-CM

## 2018-05-16 DIAGNOSIS — D519 Vitamin B12 deficiency anemia, unspecified: Secondary | ICD-10-CM

## 2018-05-16 LAB — CBC WITH DIFFERENTIAL/PLATELET
Basophils Absolute: 0 10*3/uL (ref 0–0.1)
Basophils Relative: 1 %
Eosinophils Absolute: 0.3 10*3/uL (ref 0–0.7)
Eosinophils Relative: 4 %
HCT: 44.1 % (ref 40.0–52.0)
Hemoglobin: 14.9 g/dL (ref 13.0–18.0)
Lymphocytes Relative: 25 %
Lymphs Abs: 1.4 10*3/uL (ref 1.0–3.6)
MCH: 31.3 pg (ref 26.0–34.0)
MCHC: 33.7 g/dL (ref 32.0–36.0)
MCV: 92.6 fL (ref 80.0–100.0)
Monocytes Absolute: 0.5 10*3/uL (ref 0.2–1.0)
Monocytes Relative: 8 %
Neutro Abs: 3.6 10*3/uL (ref 1.4–6.5)
Neutrophils Relative %: 62 %
Platelets: 98 10*3/uL — ABNORMAL LOW (ref 150–440)
RBC: 4.77 MIL/uL (ref 4.40–5.90)
RDW: 16.3 % — ABNORMAL HIGH (ref 11.5–14.5)
WBC: 5.8 10*3/uL (ref 3.8–10.6)

## 2018-05-16 LAB — IRON AND TIBC
Iron: 114 ug/dL (ref 45–182)
Saturation Ratios: 31 % (ref 17.9–39.5)
TIBC: 368 ug/dL (ref 250–450)
UIBC: 254 ug/dL

## 2018-05-16 LAB — FOLATE: Folate: 7.9 ng/mL (ref >5.9)

## 2018-05-16 LAB — VITAMIN B12: Vitamin B-12: 458 pg/mL (ref 180–914)

## 2018-05-16 LAB — FERRITIN: Ferritin: 79 ng/mL (ref 24–336)

## 2018-05-16 MED ORDER — FOLIC ACID 1 MG PO TABS: 1.0000 mg | ORAL_TABLET | Freq: Every day | ORAL | 0 refills | Status: DC

## 2018-05-16 NOTE — Progress Notes (Signed)
[facility] day:  05/16/2018  Chief Complaint: Dwayne Thompson is a 72 y.o. male with a recent upper GI bleed secondary to AVMs, B12 and folate deficiency, who is seen for 3 month assessment.  HPI:  The patient was last seen in the medical oncology clinic on 02/14/2018.  At that time, he had persistent fatigue and exertional dyspnea. He denied bleeding; no hematochezia, melena, or gross hematuria.  He continued to drink alcohol daily. Exam was unremarkable.  Hemoglobin was 12.3 and platelets 101,000.  Ferritin was 177.  Folate was 56.4.  B12 was 427.  EGD on 04/05/2018 by Dr. Bonna Gains revealed salmon-colored mucosa suspicious for short-segment Barrett's esophagus, erythematous mucosa in the antrum, and erythematous duodenopathy.  Biopsies revealed mild chronic inactive gastritis.  There was no H pylori.  There was squamocolunar mucosa with erosive esophagitis.  Colonoscopy on 04/05/2018 revealed diverticulosis in the ascending colon, two 2-4 mm polyps in the ascending colon tubular adenomas), and two 2-3 mm polyps in the sigmoid colon (tubilar adenoma and hyperplastic polyp).  During the interim, he has felt "fine".  He notes blisters on his legs for which he was prescribed Zyrtec and valacyclovir.  He has taken Benadryl 4 times a day for pruritus.  He has not cut back on his alcohol intake.   Past Medical History:  Diagnosis Date  . Anemia   . Back pain   . BPH (benign prostatic hyperplasia)   . ED (erectile dysfunction)   . Hyperlipidemia     Past Surgical History:  Procedure Laterality Date  . COLONOSCOPY Left 01/03/2018   Procedure: COLONOSCOPY;  Surgeon: Virgel Manifold, MD;  Location: [facility] ENDOSCOPY;  Service: Endoscopy;  Laterality: Left;  . COLONOSCOPY WITH PROPOFOL N/A 04/05/2018   Procedure: COLONOSCOPY WITH PROPOFOL;  Surgeon: Virgel Manifold, MD;  Location: [facility] ENDOSCOPY;  Service: Endoscopy;  Laterality: N/A;  . ENTEROSCOPY   01/03/2018   Procedure: ENTEROSCOPY;  Surgeon: Virgel Manifold, MD;  Location: [facility] ENDOSCOPY;  Service: Endoscopy;;  . ESOPHAGOGASTRODUODENOSCOPY N/A 01/02/2018   Procedure: ESOPHAGOGASTRODUODENOSCOPY (EGD);  Surgeon: Virgel Manifold, MD;  Location: [facility] ENDOSCOPY;  Service: Endoscopy;  Laterality: N/A;  . ESOPHAGOGASTRODUODENOSCOPY (EGD) WITH PROPOFOL N/A 04/05/2018   Procedure: ESOPHAGOGASTRODUODENOSCOPY (EGD) WITH PROPOFOL;  Surgeon: Virgel Manifold, MD;  Location: [facility] ENDOSCOPY;  Service: Endoscopy;  Laterality: N/A;  . HERNIA REPAIR     1980's    Family History  Problem Relation Age of Onset  . Alzheimer's disease Mother   . Sarcoidosis Sister   . Diabetes Brother   . Hypertension Brother   . Anxiety disorder Brother   . Kidney disease Neg Hx   . Prostate cancer Neg Hx     Social History:  reports that he quit smoking about 4 years ago. His smoking use included cigarettes. He has a 50.00 pack-year smoking history. He has never used smokeless tobacco. He reports that he drinks about 21.0 - 28.0 standard drinks of alcohol per week. He reports that he does not use drugs.  He drinks 2-3 alcohol shots/day.  The patient is accompanied by his wife, [name], today.  Allergies:  Allergies  Allergen Reactions  . Tamsulosin Hives  . Other Rash  . Tamsulosin Hcl Hives  . Sulfa Antibiotics Rash    Current Medications: Current Outpatient Medications  Medication Sig Dispense Refill  . acetaminophen (TYLENOL) 325 MG tablet Take 2 tablets (650 mg total) by mouth 3 (three) times daily. 180 tablet 0  .  Cholecalciferol 1000 UNITS tablet Take 1,000 Units by mouth daily. Reported on 03/03/2016    . doxazosin (CARDURA) 4 MG tablet TAKE 1 TABLET DAILY 90 tablet 3  . lisinopril (PRINIVIL,ZESTRIL) 10 MG tablet Take 1 tablet (10 mg total) by mouth daily. 90 tablet 3  . rosuvastatin (CRESTOR) 10 MG tablet Take 1 tablet (10 mg total) by mouth daily. 90 tablet 3  . valACYclovir (VALTREX)  1000 MG tablet Take 1 tablet (1,000 mg total) by mouth 3 (three) times daily. 21 tablet 0  . vitamin B-12 1000 MCG tablet Take 1 tablet (1,000 mcg total) by mouth daily. 180 tablet 1  . aspirin EC 81 MG EC tablet Take 1 tablet (81 mg total) by mouth daily. 180 tablet 0  . cetirizine (ZYRTEC) 10 MG tablet Take 1 tablet (10 mg total) by mouth daily. 30 tablet 11  . folic acid (FOLVITE) 1 MG tablet Take 1 tablet (1 mg total) by mouth daily. 180 tablet 0  . oxybutynin (DITROPAN-XL) 10 MG 24 hr tablet Take 1 tablet (10 mg total) by mouth daily. 30 tablet 11  . pantoprazole (PROTONIX) 20 MG tablet TAKE (1) TABLET TWICE A DAY. 60 tablet 0  . potassium chloride (K-DUR,KLOR-CON) 10 MEQ tablet Take 1 tablet (10 mEq total) by mouth 2 (two) times daily. 60 tablet 5  . tadalafil (ADCIRCA/CIALIS) 20 MG tablet Take 1 tablet (20 mg total) by mouth daily as needed for erectile dysfunction. 15 tablet 11   No current facility-administered medications for this visit.     Review of Systems  Constitutional: Negative for chills, diaphoresis, fever, malaise/fatigue and weight loss (7 pounds).       Feels "fine".  HENT: Negative.  Negative for congestion, ear discharge, ear pain, nosebleeds, sinus pain, sore throat and tinnitus.   Eyes: Negative.  Negative for blurred vision, double vision, photophobia, pain, discharge and redness.  Respiratory: Positive for shortness of breath (exertional). Negative for cough, hemoptysis and sputum production.   Cardiovascular: Negative.  Negative for chest pain, palpitations, orthopnea, leg swelling and PND.  Gastrointestinal: Negative.  Negative for abdominal pain, blood in stool, constipation, diarrhea, melena, nausea and vomiting.  Genitourinary: Negative.  Negative for dysuria, frequency and hematuria.  Musculoskeletal: Negative.  Negative for back pain, joint pain, myalgias and neck pain.  Skin: Positive for itching and rash (blisters on legs).  Neurological: Negative for  dizziness, tingling, tremors, sensory change, speech change, focal weakness, weakness and headaches.  Endo/Heme/Allergies: Does not bruise/bleed easily.  Psychiatric/Behavioral: Negative for depression and memory loss. The patient is not nervous/anxious and does not have insomnia.   All other systems reviewed and are negative.  Performance status (ECOG):  1   Physical Exam: Blood pressure 125/80, pulse 64, temperature (!) 96 F (35.6 C), temperature source Tympanic, resp. rate (!) 8, weight 195 lb 15.8 oz (88.9 kg), SpO2 100 %. GENERAL:  Well developed, well nourished, gentleman sitting comfortably in the exam room in no acute distress. MENTAL STATUS:  Alert and oriented to person, place and time. HEAD:  Short black hair.  Normocephalic, atraumatic, face symmetric, no Cushingoid features. EYES:  Brown eyes.  Pupils equal round and reactive to light and accomodation.  No conjunctivitis or scleral icterus. ENT:  Oropharynx clear without lesion.  Tongue normal. Mucous membranes moist.  RESPIRATORY:  Clear to auscultation without rales, wheezes or rhonchi. CARDIOVASCULAR:  Regular rate and rhythm without murmur, rub or gallop. ABDOMEN:  Soft, non-tender, with active bowel sounds, and no hepatosplenomegaly.  No  masses. SKIN:  Lesions on legs.  Some open. EXTREMITIES: No edema, no skin discoloration or tenderness.  No palpable cords. LYMPH NODES: No palpable cervical, supraclavicular, axillary or inguinal adenopathy  NEUROLOGICAL: Unremarkable. PSYCH:  Appropriate.    Appointment on 05/16/2018  Component Date Value Ref Range Status  . Iron 05/16/2018 114  45 - 182 ug/dL Final  . TIBC 05/16/2018 368  250 - 450 ug/dL Final  . Saturation Ratios 05/16/2018 31  17.9 - 39.5 % Final  . UIBC 05/16/2018 254  ug/dL Final   Performed at [facility], [address], [place], [place] [address]  . Folate 05/16/2018 7.9  >5.9 ng/mL Final   Performed at [facility], [address] [place], [place] [address]  . Vitamin B-12 05/16/2018 458  180 - 914 pg/mL Final   Comment: (NOTE) This assay is not validated for testing neonatal or myeloproliferative syndrome specimens for Vitamin B12 levels. Performed at [facility], [place] [address], [place], [place] [address]   . Ferritin 05/16/2018 79  24 - 336 ng/mL Final   Performed at [facility], [address] [place], [place] [address]  . WBC 05/16/2018 5.8  3.8 - 10.6 K/uL Final  . RBC 05/16/2018 4.77  4.40 - 5.90 MIL/uL Final  . Hemoglobin 05/16/2018 14.9  13.0 - 18.0 g/dL Final  . HCT 05/16/2018 44.1  40.0 - 52.0 % Final  . MCV 05/16/2018 92.6  80.0 - 100.0 fL Final  . MCH 05/16/2018 31.3  26.0 - 34.0 pg Final  . MCHC 05/16/2018 33.7  32.0 - 36.0 g/dL Final  . RDW 05/16/2018 16.3* 11.5 - 14.5 % Final  . Platelets 05/16/2018 98* 150 - 440 K/uL Final  . Neutrophils Relative % 05/16/2018 62  % Final  . Neutro Abs 05/16/2018 3.6  1.4 - 6.5 K/uL Final  . Lymphocytes Relative 05/16/2018 25  % Final  . Lymphs Abs 05/16/2018 1.4  1.0 - 3.6 K/uL Final  . Monocytes Relative 05/16/2018 8  % Final  . Monocytes Absolute 05/16/2018 0.5  0.2 - 1.0 K/uL Final  . Eosinophils Relative 05/16/2018 4  % Final  . Eosinophils Absolute 05/16/2018 0.3  0 - 0.7 K/uL Final  . Basophils Relative 05/16/2018 1  % Final  . Basophils Absolute 05/16/2018 0.0  0 - 0.1 K/uL Final   Performed at [facility], [address] [place], [place] [address]    Assessment:  INRI SOBIESKI is a 71 y.o. 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  He has had mild chronic thrombocytopenia since 04/2017.  Platelet count has ranged between 101,000 - 134,000.  During his active GI bleed platelet nadir was 83,000.  He has B12 deficiency and folate deficiency.  B12 was 221 and folate 5.7 on 01/03/2018.  He is on oral L57 and folic acid.  Diet is good.  He drinks alcohol daily.  Anemia and  thrombocytopenia work-up on 01/04/2018 revealed the following normal studies: hepatitis B surface antigen, hepatitis B core antibody total, hepatitis C antibody, and ANA.  Ferritin was 272.  Iron saturation was 34% with a TIBC of 265.  Abdominal ultrasound on 01/03/2018 revealed a normal spleen.  Ferritin has been followed: 272 on 01/03/2018, 231 on 01/10/2018, 177 on 02/14/2018, and 79 on 05/16/2018.  Upper endoscopy on 01/02/2018 revealed a normal esophagus, erythematous mucosa in the antrum, erythematous duodenopathy, and nodular mucosa in the duodenal bulb.  There was no ulcerations or active sites of bleeding.  Pathology revealed mild acute duodenitis, negative for dysplasia and malignancy.  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  There was no dysplasia or malignancy.  Colonoscopy on 01/03/2018 revealed melena in the entire colon c/w bleeding from the proximal GI tract.  Small bowel enteroscopy on 01/03/2018 revealed no actively bleeding lesions.  The examined portion of the jejunum was normal.    Capsule study on 01/03/2018 revealed a red lesion in the first 7% of the study likely representing duodenal erythema and nodularity noted on recent EGD.  There was a red spot at 78% with subsequent small red blood strands which could represent an oozing AVM in the distal small bowel.  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  Symptomatically, he feels fine.  He was prescribed Zyrtec and acyclovir for blisters on his legs.  Exam reveals no adenopathy or hepatosplenomegaly.  Hemoglobin 14.9.  Platelets 98,000.  Plan:   1. Labs today:  CBC with diff, ferritin, B12, folate. 2.   Normocytic anemia:  Hematocrit and hemoglobin normal.  Continue monitoring secondary to AVMs. 3.   B12 deficiency:  Continue oral B12 4.   Folate deficiency:  Continue oral folic acid. 5.   Thrombocytopenia:  Platelet count  stable.  Continue to monitor. 6.   Alcohol intake:  Encourage decreasing alcohol intake. 7.  RTC in 3 months for MD assessment and labs (CBC with diff, ferritin, B12, folate).   Nolon Stalls, MD 05/16/2018, 4:35 PM

## 2018-05-16 NOTE — Progress Notes (Signed)
Patient  offers no complaints today.  Patient did see PCP yesterday for rash/blisters on his leg.  (Possible insect bite/viral). Accompanied by his wife today.

## 2018-05-22 ENCOUNTER — Ambulatory Visit (INDEPENDENT_AMBULATORY_CARE_PROVIDER_SITE_OTHER): Payer: Medicare Other | Admitting: Family Medicine

## 2018-05-22 VITALS — BP 118/76 | HR 82 | Temp 98.3°F | Resp 16 | Wt 192.0 lb

## 2018-05-22 DIAGNOSIS — L509 Urticaria, unspecified: Secondary | ICD-10-CM | POA: Diagnosis not present

## 2018-05-22 DIAGNOSIS — R7309 Other abnormal glucose: Secondary | ICD-10-CM

## 2018-05-22 DIAGNOSIS — B880 Other acariasis: Secondary | ICD-10-CM | POA: Diagnosis not present

## 2018-05-22 DIAGNOSIS — M79604 Pain in right leg: Secondary | ICD-10-CM | POA: Diagnosis not present

## 2018-05-22 LAB — POCT GLYCOSYLATED HEMOGLOBIN (HGB A1C): Hemoglobin A1C: 5.1 % (ref 4.0–5.6)

## 2018-05-22 NOTE — Patient Instructions (Signed)
Use Topical Caladryl for itch

## 2018-05-22 NOTE — Progress Notes (Signed)
Dwayne Thompson  MRN: [id number] DOB: 1946-07-18  Subjective:  HPI   The patient is a 72 year old male who presents today for follow up of his rash.  He was seen 1 week ago and instructed to use Benadryl for symptoms.  He was also given Valacyclovir for the possibility the rash was shingles. The patient states that the blisters previously present are clearing but he has started having more.  He further states that now he his having pain in his legs.  He continues to having itching of the rash.it has all improved.  Patient Active Problem List   Diagnosis Date Noted  . CLE (columnar lined esophagus)   . Special screening for malignant neoplasms, colon   . Benign neoplasm of ascending colon   . Polyp of sigmoid colon   . Diverticulosis of large intestine without diverticulitis   . Iron deficiency anemia due to chronic blood loss 02/18/2018  . Folate deficiency 02/14/2018  . Anemia due to vitamin B12 deficiency 01/10/2018  . Anemia due to folic acid deficiency 20/25/4270  . 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 Helicobacter pylori gastritis (chronic gastritis) 01/10/2018  . Thrombocytopenia ([place]) 01/10/2018  . Internal hemorrhoids   . Acute gastrointestinal hemorrhage   . Stomach irritation   . Duodenal nodule   . Melena 01/01/2018  . Neck pain on right side 03/23/2016  . History of tobacco abuse 03/23/2016  . Cervical lymphadenitis 03/23/2016  . 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 Allergic rhinitis 03/28/2015  . 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 Failure of erection 03/28/2015  . Acid reflux 03/28/2015  . Arthritis, degenerative 03/28/2015  . Change in blood platelet count 03/28/2015  . Temporary cerebral vascular dysfunction 03/28/2015  . Personal history of tobacco use, presenting hazards to health  03/28/2015    Past Medical History:  Diagnosis Date  . Anemia   . Back pain   . BPH (benign prostatic hyperplasia)   . ED (erectile dysfunction)   . Hyperlipidemia     Social History   Socioeconomic History  . Marital status: Married    Spouse name: [name]  . Number of children: 3  . Years of education: 57  . Highest education level: 12th grade  Occupational History  . Occupation: retired  Scientific laboratory technician  . Financial resource strain: Not hard at all  . Food insecurity:    Worry: Never true    Inability: Never true  . Transportation needs:    Medical: No    Non-medical: No  Tobacco Use  . Smoking status: Former Smoker    Packs/day: 1.00    Years: 50.00    Pack years: 50.00    Types: Cigarettes    Last attempt to quit: 10/20/2013    Years since quitting: 4.5  . Smokeless tobacco: Never Used  Substance and Sexual Activity  . Alcohol use: Yes    Alcohol/week: 21.0 - 28.0 standard drinks    Types: 21 - 28 Shots of liquor per week    Comment: 3-4 gin and sodas a day  . Drug use: No  . Sexual activity: Not Currently  Lifestyle  . Physical activity:    Days per week: Not on file    Minutes per session: Not on file  . Stress: Not at all  Relationships  . Social connections:    Talks on  phone: Not on file    Gets together: Not on file    Attends religious service: Not on file    Active member of club or organization: Not on file    Attends meetings of clubs or organizations: Not on file    Relationship status: Not on file  . Intimate partner violence:    Fear of current or ex partner: Not on file    Emotionally abused: Not on file    Physically abused: Not on file    Forced sexual activity: Not on file  Other Topics Concern  . Not on file  Social History Narrative  . Not on file    Outpatient Encounter Medications as of 05/22/2018  Medication Sig Note  . acetaminophen (TYLENOL) 325 MG tablet Take 2 tablets (650 mg total) by mouth 3 (three) times daily.   Marland Kitchen  aspirin EC 81 MG EC tablet Take 1 tablet (81 mg total) by mouth daily. 05/16/2018: Patient now taking.  . cetirizine (ZYRTEC) 10 MG tablet Take 1 tablet (10 mg total) by mouth daily.   . Cholecalciferol 1000 UNITS tablet Take 1,000 Units by mouth daily. Reported on 03/03/2016   . diphenhydrAMINE (BENADRYL) 25 MG tablet Take 25 mg by mouth every 6 (six) hours as needed.   . doxazosin (CARDURA) 4 MG tablet TAKE 1 TABLET DAILY   . folic acid (FOLVITE) 1 MG tablet Take 1 tablet (1 mg total) by mouth daily.   Marland Kitchen lisinopril (PRINIVIL,ZESTRIL) 10 MG tablet Take 1 tablet (10 mg total) by mouth daily.   . pantoprazole (PROTONIX) 20 MG tablet Take 20 mg by mouth daily.   . potassium chloride (K-DUR) 10 MEQ tablet Take 1 tablet (10 mEq total) by mouth 2 (two) times daily.   . rosuvastatin (CRESTOR) 10 MG tablet Take 1 tablet (10 mg total) by mouth daily.   . valACYclovir (VALTREX) 1000 MG tablet Take 1 tablet (1,000 mg total) by mouth 3 (three) times daily.   . vitamin B-12 1000 MCG tablet Take 1 tablet (1,000 mcg total) by mouth daily.    No facility-administered encounter medications on file as of 05/22/2018.     Allergies  Allergen Reactions  . Tamsulosin Hcl Hives  . Sulfa Antibiotics Rash    Review of Systems  Constitutional: Negative for fever.       Some mild recent excess thirst.  Eyes: Negative.   Cardiovascular: Negative for claudication and leg swelling.  Gastrointestinal: Negative.   Genitourinary: Positive for frequency.       Nocturia.  Musculoskeletal: Positive for myalgias.       Mild pain middle of right lower anterior leg.  Skin: Positive for itching and rash.  Neurological: Negative.   Endo/Heme/Allergies: Positive for environmental allergies.  Psychiatric/Behavioral: Negative.     Objective:  BP 118/76 (BP Location: Right Arm, Patient Position: Sitting, Cuff Size: Normal)   Pulse 82   Temp 98.3 F (36.8 C) (Oral)   Resp 16   Wt 192 lb (87.1 kg)   SpO2 99%   BMI 26.41  kg/m   Physical Exam  Constitutional: He is oriented to person, place, and time and well-developed, well-nourished, and in no distress.  HENT:  Head: Normocephalic and atraumatic.  Right Ear: External ear normal.  Left Ear: External ear normal.  Nose: Nose normal.  Mouth/Throat: Oropharynx is clear and moist.  Eyes: Conjunctivae are normal. No scleral icterus.  Cardiovascular: Normal rate, regular rhythm and normal heart sounds.  Pulmonary/Chest: Effort normal and breath  sounds normal.  Lymphadenopathy:    He has no cervical adenopathy.  Neurological: He is alert and oriented to person, place, and time. Gait normal. GCS score is 15.  Skin: Skin is warm and dry.  Psychiatric: Mood, memory, affect and judgment normal.    Assessment and Plan :  1. Elevated glucose  - POCT glycosylated hemoglobin (Hb A1C)--5.1  2. Urticaria   3. Chiggers 4.Rtigh leg pain Xray right tibia  I have done the exam and reviewed the chart and it is accurate to the best of my knowledge. Development worker, community has been used and  any errors in dictation or transcription are unintentional. [name] M.D. [facility]

## 2018-05-28 ENCOUNTER — Telehealth: Payer: Self-pay | Admitting: Family Medicine

## 2018-05-28 ENCOUNTER — Other Ambulatory Visit: Payer: Self-pay

## 2018-05-28 DIAGNOSIS — M79604 Pain in right leg: Secondary | ICD-10-CM

## 2018-05-28 NOTE — Telephone Encounter (Signed)
Pt's wife [name] called back asking for an update on the request for images. [name] stated that pt's leg is hurting him. Please advise. Thanks TNP

## 2018-05-28 NOTE — Telephone Encounter (Signed)
Pt's wife called saying her husband is having a lot of pain in his right leg.  He was in last week and dr Rosanna Randy told him if it continued he would order an imaging test to see what is going on.  She ask for Jiles Garter to call her back  Pt's CB#  [phone]  Thanks  Con Memos

## 2018-05-28 NOTE — Telephone Encounter (Signed)
Please advise   Thanks,    -Staton Markey  

## 2018-05-28 NOTE — Telephone Encounter (Signed)
Please review, I haven't called her yet, I was going to wait and see what test you wanted.

## 2018-05-28 NOTE — Telephone Encounter (Signed)
Xray of tibia if pain is still lower leg.

## 2018-05-29 ENCOUNTER — Ambulatory Visit
Admission: RE | Admit: 2018-05-29 | Discharge: 2018-05-29 | Disposition: A | Discharge status: Home / Self Care | Payer: Medicare Other | Source: Ambulatory Visit | Attending: Family Medicine | Admitting: Family Medicine

## 2018-05-29 DIAGNOSIS — M79604 Pain in right leg: Secondary | ICD-10-CM | POA: Insufficient documentation

## 2018-05-31 ENCOUNTER — Encounter: Payer: Self-pay | Admitting: Family Medicine

## 2018-05-31 ENCOUNTER — Ambulatory Visit (INDEPENDENT_AMBULATORY_CARE_PROVIDER_SITE_OTHER): Payer: Medicare Other | Admitting: Family Medicine

## 2018-05-31 VITALS — BP 98/54 | HR 72 | Temp 98.0°F | Resp 16 | Wt 190.0 lb

## 2018-05-31 DIAGNOSIS — K219 Gastro-esophageal reflux disease without esophagitis: Secondary | ICD-10-CM

## 2018-05-31 DIAGNOSIS — M79604 Pain in right leg: Secondary | ICD-10-CM

## 2018-05-31 DIAGNOSIS — Z23 Encounter for immunization: Secondary | ICD-10-CM

## 2018-05-31 DIAGNOSIS — M5410 Radiculopathy, site unspecified: Secondary | ICD-10-CM

## 2018-05-31 DIAGNOSIS — N401 Enlarged prostate with lower urinary tract symptoms: Secondary | ICD-10-CM

## 2018-05-31 DIAGNOSIS — N138 Other obstructive and reflux uropathy: Secondary | ICD-10-CM

## 2018-05-31 MED ORDER — PREDNISONE 10 MG PO TABS: 10.0000 mg | ORAL_TABLET | ORAL | 0 refills | Status: DC

## 2018-05-31 NOTE — Progress Notes (Signed)
Patient: Dwayne Thompson Male    DOB: 09-14-1946   72 y.o.   MRN: [id number] Visit Date: 05/31/2018  Today's Provider: [name], MD   Chief Complaint  Patient presents with  . Follow-up   Subjective:    HPI  Rtigh leg pain From 05/22/2018-ordered Xray right tibia-showing normal. Advised may need to to reassess leg pain. Also offered ortho referral.   Patient is is here to discuss what the nest step is for his right leg pain. His pain is slightly improved  Allergies  Allergen Reactions  . Tamsulosin Hcl Hives  . Sulfa Antibiotics Rash     Current Outpatient Medications:  .  acetaminophen (TYLENOL) 325 MG tablet, Take 2 tablets (650 mg total) by mouth 3 (three) times daily., Disp: 180 tablet, Rfl: 0 .  aspirin EC 81 MG EC tablet, Take 1 tablet (81 mg total) by mouth daily., Disp: 180 tablet, Rfl: 0 .  cetirizine (ZYRTEC) 10 MG tablet, Take 1 tablet (10 mg total) by mouth daily., Disp: 30 tablet, Rfl: 0 .  Cholecalciferol 1000 UNITS tablet, Take 1,000 Units by mouth daily. Reported on 03/03/2016, Disp: , Rfl:  .  diphenhydrAMINE (BENADRYL) 25 MG tablet, Take 25 mg by mouth every 6 (six) hours as needed., Disp: , Rfl:  .  doxazosin (CARDURA) 4 MG tablet, TAKE 1 TABLET DAILY, Disp: 90 tablet, Rfl: 3 .  folic acid (FOLVITE) 1 MG tablet, Take 1 tablet (1 mg total) by mouth daily., Disp: 180 tablet, Rfl: 0 .  lisinopril (PRINIVIL,ZESTRIL) 10 MG tablet, Take 1 tablet (10 mg total) by mouth daily., Disp: 90 tablet, Rfl: 3 .  pantoprazole (PROTONIX) 20 MG tablet, Take 20 mg by mouth daily., Disp: , Rfl:  .  potassium chloride (K-DUR) 10 MEQ tablet, Take 1 tablet (10 mEq total) by mouth 2 (two) times daily., Disp: 60 tablet, Rfl: 3 .  rosuvastatin (CRESTOR) 10 MG tablet, Take 1 tablet (10 mg total) by mouth daily., Disp: 90 tablet, Rfl: 3 .  valACYclovir (VALTREX) 1000 MG tablet, Take 1 tablet (1,000 mg total) by mouth 3 (three) times daily., Disp: 21 tablet, Rfl: 0 .  vitamin  B-12 1000 MCG tablet, Take 1 tablet (1,000 mcg total) by mouth daily., Disp: 180 tablet, Rfl: 1  Review of Systems  Constitutional: Negative for appetite change, chills and fever.  HENT: Negative.   Eyes: Negative.   Respiratory: Negative for chest tightness, shortness of breath and wheezing.   Cardiovascular: Negative for chest pain and palpitations.  Gastrointestinal: Negative for abdominal pain, nausea and vomiting.  Endocrine: Negative.   Musculoskeletal: Positive for arthralgias.  Allergic/Immunologic: Negative.   Neurological: Negative.   Psychiatric/Behavioral: Negative.     Social History   Tobacco Use  . Smoking status: Former Smoker    Packs/day: 1.00    Years: 50.00    Pack years: 50.00    Types: Cigarettes    Last attempt to quit: 10/20/2013    Years since quitting: 4.6  . Smokeless tobacco: Never Used  Substance Use Topics  . Alcohol use: Yes    Alcohol/week: 21.0 - 28.0 standard drinks    Types: 21 - 28 Shots of liquor per week    Comment: 3-4 gin and sodas a day   Objective:   BP (!) 98/54 (BP Location: Right Arm, Patient Position: Sitting, Cuff Size: Large)   Pulse 72   Temp 98 F (36.7 C) (Oral)   Resp 16   Wt  190 lb (86.2 kg)   SpO2 98%   BMI 26.13 kg/m  Vitals:   05/31/18 1048  BP: (!) 98/54  Pulse: 72  Resp: 16  Temp: 98 F (36.7 C)  TempSrc: Oral  SpO2: 98%  Weight: 190 lb (86.2 kg)     Physical Exam  Constitutional: He is oriented to person, place, and time. He appears well-developed and well-nourished.  HENT:  Head: Normocephalic and atraumatic.  Right Ear: External ear normal.  Left Ear: External ear normal.  Nose: Nose normal.  Eyes: Conjunctivae are normal. No scleral icterus.  Neck: No thyromegaly present.  Cardiovascular: Normal rate, regular rhythm and normal heart sounds.  Pulmonary/Chest: Effort normal and breath sounds normal.  Abdominal: Soft.  Musculoskeletal: He exhibits no edema or tenderness.  Neurological: He is  alert and oriented to person, place, and time.  LE exam normal.Strength normal.  Skin: Skin is dry.        Assessment & Plan:     1. Need for influenza vaccination  - Flu vaccine HIGH DOSE PF  2. Pain of right lower extremity  - predniSONE (DELTASONE) 10 MG tablet; Take 1 tablet (10 mg total) by mouth See admin instructions.  Dispense: 21 tablet; Refill: 0 - Ambulatory referral to Orthopedic Surgery  3. Radiculopathy of leg The pain appears to be radicular but could be his knee.  - predniSONE (DELTASONE) 10 MG tablet; Take 1 tablet (10 mg total) by mouth See admin instructions.  Dispense: 21 tablet; Refill: 0 - Ambulatory referral to Orthopedic Surgery 4.HTN Recheck BP is 110/78. 5.HLD 6.GERD 7.BPH     I have done the exam and reviewed the above chart and it is accurate to the best of my knowledge. Development worker, community has been used in this note in any air is in the dictation or transcription are unintentional.  [name], MD  [place]

## 2018-06-12 ENCOUNTER — Encounter: Payer: Self-pay | Admitting: Gastroenterology

## 2018-06-12 ENCOUNTER — Ambulatory Visit (INDEPENDENT_AMBULATORY_CARE_PROVIDER_SITE_OTHER): Payer: Medicare Other | Admitting: Gastroenterology

## 2018-06-12 VITALS — BP 122/71 | HR 83 | Ht 71.5 in | Wt 193.6 lb

## 2018-06-12 DIAGNOSIS — K7030 Alcoholic cirrhosis of liver without ascites: Secondary | ICD-10-CM | POA: Diagnosis not present

## 2018-06-12 NOTE — Patient Instructions (Signed)
F/U 6 months Return to office to have labs done.  RUQ ultrasound ordered for 9/27 (Friday) at 8:00 am. Arrival time 7:45 am-check in [facility]. Nothing to eat/drink after midnight, the night before ultrasound.   Cirrhosis Cirrhosis is long-term (chronic) liver injury. The liver is your largest internal organ, and it performs many functions. The liver converts food into energy, removes toxic material from your blood, makes important proteins, and absorbs necessary vitamins from your diet. If you have cirrhosis, it means many of your healthy liver cells have been replaced by scar tissue. This prevents blood from flowing through your liver, which makes it difficult for your liver to function. This scarring is not reversible, but treatment can prevent it from getting worse. What are the causes? Hepatitis C and long-term alcohol abuse are the most common causes of cirrhosis. Other causes include:  Nonalcoholic fatty liver disease.  Hepatitis B infection.  Autoimmune hepatitis.  Diseases that cause blockage of ducts inside the liver.  Inherited liver diseases.  Reactions to certain long-term medicines.  Parasitic infections.  Long-term exposure to certain toxins.  What increases the risk? You may have a higher risk of cirrhosis if you:  Have certain hepatitis viruses.  Abuse alcohol, especially if you are male.  Are overweight.  Share needles.  Have unprotected sex with someone who has hepatitis.  What are the signs or symptoms? You may not have any signs and symptoms at first. Symptoms may not develop until the damage to your liver starts to get worse. Signs and symptoms of cirrhosis may include:  Tenderness in the right-upper part of your abdomen.  Weakness and tiredness (fatigue).  Loss of appetite.  Nausea.  Weight loss and muscle loss.  Itchiness.  Yellow skin and eyes (jaundice).  Buildup of fluid in the abdomen  (ascites).  Swelling of the feet and ankles (edema).  Appearance of tiny blood vessels under the skin.  Mental confusion.  Easy bruising and bleeding.  How is this diagnosed? Your health care provider may suspect cirrhosis based on your symptoms and medical history, especially if you have other medical conditions or a history of alcohol abuse. Your health care provider will do a physical exam to feel your liver and check for signs of cirrhosis. Your health care provider may perform other tests, including:  Blood tests to check: ? Whether you have hepatitis B or C. ? Kidney function. ? Liver function.  Imaging tests such as: ? MRI or CT scan to look for changes seen in advanced cirrhosis. ? Ultrasound to see if normal liver tissue is being replaced by scar tissue.  A procedure using a long needle to take a sample of liver tissue (biopsy) for examination under a microscope. Liver biopsy can confirm the diagnosis of cirrhosis.  How is this treated? Treatment depends on how damaged your liver is and what caused the damage. Treatment may include treating cirrhosis symptoms or treating the underlying causes of the condition to try to slow the progression of the damage. Treatment may include:  Making lifestyle changes, such as: ? Eating a healthy diet. ? Restricting salt intake. ? Maintaining a healthy weight. ? Not abusing drugs or alcohol.  Taking medicines to: ? Treat liver infections or other infections. ? Control itching. ? Reduce fluid buildup. ? Reduce certain blood toxins. ? Reduce risk of bleeding from enlarged blood vessels in the stomach or esophagus (varices).  If varices are causing bleeding problems, you may need  treatment with a procedure that ties up the vessels causing them to fall off (band ligation).  If cirrhosis is causing your liver to fail, your health care provider may recommend a liver transplant.  Other treatments may be recommended depending on any  complications of cirrhosis, such as liver-related kidney failure (hepatorenal syndrome).  Follow these instructions at home:  Take medicines only as directed by your health care provider. Do not use drugs that are toxic to your liver. Ask your health care provider before taking any new medicines, including over-the-counter medicines.  Rest as needed.  Eat a well-balanced diet. Ask your health care provider or dietitian for more information.  You may have to follow a low-salt diet or restrict your water intake as directed.  Do not drink alcohol. This is especially important if you are taking acetaminophen.  Keep all follow-up visits as directed by your health care provider. This is important. Contact a health care provider if:  You have fatigue or weakness that is getting worse.  You develop swelling of the hands, feet, legs, or face.  You have a fever.  You develop loss of appetite.  You have nausea or vomiting.  You develop jaundice.  You develop easy bruising or bleeding. Get help right away if:  You vomit bright red blood or a material that looks like coffee grounds.  You have blood in your stools.  Your stools appear black and tarry.  You become confused.  You have chest pain or trouble breathing. This information is not intended to replace advice given to you by your health care provider. Make sure you discuss any questions you have with your health care provider. Document Released: 09/05/2005 Document Revised: 01/14/2016 Document Reviewed: 05/14/2014 Elsevier Interactive Patient Education  [employer].

## 2018-06-12 NOTE — Progress Notes (Signed)
Dwayne Antigua, MD [address]  [place]  [place], [place] [address]  Main: [phone]  Fax: [phone]   Primary Care Physician: Jerrol Banana., MD  Primary Gastroenterologist:  Dr. Vonda Thompson  Chief Complaint  Patient presents with  . Follow-up    post colonoscopy/EGD, post blood in stool, hx h pylori    HPI: Dwayne Thompson is a 72 y.o. male with history of GI bleed and H. pylori here for follow-up.  No further episodes of GI bleeding.  No abdominal pain, good appetite, no weight loss.  No altered bowel habits, melena or hematochezia.  Patient receiving IV iron transfusion by hematology with good response and improved ferritin.  Pt reports chronic history of alcohol use 2-3 drinks of liquor daily. EGD did not reveal any varices.  No episodes of confusion.  No previously documented history of cirrhosis.  Patient does have thrombocytopenia on lab work.  Previous history: Patient was seen for hospitalization consult on April 15 due to melena. He also had history of ibuprofen use prior to the hospitalization. He underwent extensive work-up. EGD on April 16 showed erythematous mucosa in the antrum, duodenum. Nodular erythematous mucosa in the duodenum was biopsied and clipped. No active bleeding was seen during the EGD. Colonoscopy on April 17 showed melena but no red blood. No actively bleeding lesions. This was consistent with a more proximal GI source of bleeding. He underwent push enteroscopy that showed same findings as  previous EGD, and no actively bleeding lesions. Normal duodenum. Capsule endoscopy was performed and did not identify specific source of bleeding. However, small nonspecific red blood strand was seen in the small bowel, which could represent an oozing AVM (however, no AVMs were identified on the pill camera itself).  Gastric biopsies showed H. pylori, and he has completed H. pylori triple therapy.  Patient underwent repeat  colonoscopy and EGD on April 05, 2018 Patient reported 2 tongues of salmon-colored mucosa, maximal longitudinal extent 1 cm.  Biopsy. Gastric erythema.  Biopsied. mild erythema duodenal bulb.  Biopsied.  Colonoscopy with normal terminal ileum.  4 less than 1 cm polyps removed from the ascending colon and sigmoid colon, nonbleeding internal hemorrhoids, diverticulosis.  Biopsies did not show any evidence of H. pylori.  Antral atrophy was reported.  GE junction biopsies negative for goblet cells, dysplasia, and reported reflux esophagitis.  Colon polyps showed tubular adenoma.   Current Outpatient Medications  Medication Sig Dispense Refill  . acetaminophen (TYLENOL) 325 MG tablet Take 2 tablets (650 mg total) by mouth 3 (three) times daily. 180 tablet 0  . aspirin EC 81 MG EC tablet Take 1 tablet (81 mg total) by mouth daily. 180 tablet 0  . cetirizine (ZYRTEC) 10 MG tablet Take 1 tablet (10 mg total) by mouth daily. 30 tablet 0  . Cholecalciferol 1000 UNITS tablet Take 1,000 Units by mouth daily. Reported on 03/03/2016    . diphenhydrAMINE (BENADRYL) 25 MG tablet Take 25 mg by mouth every 6 (six) hours as needed.    . doxazosin (CARDURA) 4 MG tablet TAKE 1 TABLET DAILY 90 tablet 3  . folic acid (FOLVITE) 1 MG tablet Take 1 tablet (1 mg total) by mouth daily. 180 tablet 0  . lisinopril (PRINIVIL,ZESTRIL) 10 MG tablet Take 1 tablet (10 mg total) by mouth daily. 90 tablet 3  . pantoprazole (PROTONIX) 20 MG tablet Take 20 mg by mouth daily.    . potassium chloride (K-DUR) 10 MEQ tablet Take 1 tablet (10  mEq total) by mouth 2 (two) times daily. 60 tablet 3  . predniSONE (DELTASONE) 10 MG tablet Take 1 tablet (10 mg total) by mouth See admin instructions. 21 tablet 0  . rosuvastatin (CRESTOR) 10 MG tablet Take 1 tablet (10 mg total) by mouth daily. 90 tablet 3  . valACYclovir (VALTREX) 1000 MG tablet Take 1 tablet (1,000 mg total) by mouth 3 (three) times daily. 21 tablet 0  . vitamin B-12 1000 MCG  tablet Take 1 tablet (1,000 mcg total) by mouth daily. 180 tablet 1   No current facility-administered medications for this visit.     Allergies as of 06/12/2018 - Review Complete 06/12/2018  Allergen Reaction Noted  . Tamsulosin Hives 06/12/2018  . Other Rash 06/12/2018  . Tamsulosin hcl Hives 03/28/2015  . Sulfa antibiotics Rash 01/02/2018    ROS:  General: Negative for anorexia, weight loss, fever, chills, fatigue, weakness. ENT: Negative for hoarseness, difficulty swallowing , nasal congestion. CV: Negative for chest pain, angina, palpitations, dyspnea on exertion, peripheral edema.  Respiratory: Negative for dyspnea at rest, dyspnea on exertion, cough, sputum, wheezing.  GI: See history of present illness. GU:  Negative for dysuria, hematuria, urinary incontinence, urinary frequency, nocturnal urination.  Endo: Negative for unusual weight change.    Physical Examination:   BP 122/71   Pulse 83   Ht 5' 11.5" (1.816 m)   Wt 193 lb 9.6 oz (87.8 kg)   BMI 26.63 kg/m   General: Well-nourished, well-developed in no acute distress.  Eyes: No icterus. Conjunctivae pink. Mouth: Oropharyngeal mucosa moist and pink , no lesions erythema or exudate. Neck: Supple, Trachea midline Abdomen: Bowel sounds are normal, nontender, nondistended, no hepatosplenomegaly or masses, no abdominal bruits or hernia , no rebound or guarding.   Extremities: No lower extremity edema. No clubbing or deformities. Neuro: Alert and oriented x 3.  Grossly intact. Skin: Warm and dry, no jaundice.   Psych: Alert and cooperative, normal mood and affect.   Labs: CMP     Component Value Date/Time   NA 135 01/02/2018 0519   NA 143 12/05/2017 0831   K 3.4 (L) 01/02/2018 0519   CL 109 01/02/2018 0519   CO2 22 01/02/2018 0519   GLUCOSE 96 01/02/2018 0519   BUN 23 (H) 01/02/2018 0519   BUN 10 12/05/2017 0831   CREATININE 0.81 01/02/2018 0519   CALCIUM 8.2 (L) 01/02/2018 0519   PROT 7.1 01/01/2018 1041    PROT 7.1 12/05/2017 0831   ALBUMIN 4.1 01/01/2018 1041   ALBUMIN 4.3 12/05/2017 0831   AST 26 01/01/2018 1041   ALT 17 01/01/2018 1041   ALKPHOS 41 01/01/2018 1041   BILITOT 0.7 01/01/2018 1041   BILITOT 0.6 12/05/2017 0831   GFRNONAA >60 01/02/2018 0519   GFRAA >60 01/02/2018 0519   Lab Results  Component Value Date   WBC 5.8 05/16/2018   HGB 14.9 05/16/2018   HCT 44.1 05/16/2018   MCV 92.6 05/16/2018   PLT 98 (L) 05/16/2018    Imaging Studies: Dg Tibia/fibula Right  Result Date: 05/29/2018 CLINICAL DATA:  72 year old male with pain lateral side extending down anterior right ankle for 2 weeks getting worse. No injury. Initial encounter. EXAM: RIGHT TIBIA AND FIBULA - 2 VIEW COMPARISON:  None. FINDINGS: No fracture or dislocation.  No bony destructive lesion. Dense menisci may represent changes of chondrocalcinosis. Vascular calcifications. Tiny plantar spur. No obvious soft tissue abnormality. IMPRESSION: No acute abnormality detected as cause of patient's symptoms. Electronically Signed   By:  Genia Del M.D.   On: 05/29/2018 12:58    Assessment and Plan:   Dwayne Thompson is a 72 y.o. 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 Pylori  GI bleed Resolved Follow-up with hematology for IV iron transfusions  H. 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 pylori, and this is likely to improve since H. pylori is not eradicated Can reassess in 2  to 3 years with repeat biopsies  Colonoscopy up-to-date, repeat recommended in 5 years from July 2019.  Dr Dwayne Thompson

## 2018-06-13 ENCOUNTER — Other Ambulatory Visit: Payer: Self-pay | Admitting: Family Medicine

## 2018-06-15 ENCOUNTER — Ambulatory Visit
Admission: RE | Admit: 2018-06-15 | Discharge: 2018-06-15 | Disposition: A | Discharge status: Home / Self Care | Payer: Medicare Other | Source: Ambulatory Visit | Attending: Gastroenterology | Admitting: Gastroenterology

## 2018-06-15 DIAGNOSIS — K7030 Alcoholic cirrhosis of liver without ascites: Secondary | ICD-10-CM

## 2018-06-16 LAB — PROTIME-INR
INR: 1.1 (ref 0.8–1.2)
Prothrombin Time: 11.1 s (ref 9.1–12.0)

## 2018-06-18 LAB — MITOCHONDRIAL/SMOOTH MUSCLE AB PNL
Mitochondrial Ab: <20 Units (ref 0.0–20.0)
Smooth Muscle Ab: 14 Units (ref 0–19)

## 2018-06-18 LAB — COMPREHENSIVE METABOLIC PANEL
ALT: 12 IU/L (ref 0–44)
AST: 16 IU/L (ref 0–40)
Albumin/Globulin Ratio: 1.6 (ref 1.2–2.2)
Albumin: 4.4 g/dL (ref 3.5–4.8)
Alkaline Phosphatase: 51 IU/L (ref 39–117)
BUN/Creatinine Ratio: 12 (ref 10–24)
BUN: 11 mg/dL (ref 8–27)
Bilirubin Total: 0.4 mg/dL (ref 0.0–1.2)
CO2: 25 mmol/L (ref 20–29)
Calcium: 9.4 mg/dL (ref 8.6–10.2)
Chloride: 98 mmol/L (ref 96–106)
Creatinine, Ser: 0.95 mg/dL (ref 0.76–1.27)
GFR calc Af Amer: 92 mL/min/{1.73_m2} (ref >59)
GFR calc non Af Amer: 80 mL/min/{1.73_m2} (ref >59)
Globulin, Total: 2.7 g/dL (ref 1.5–4.5)
Glucose: 93 mg/dL (ref 65–99)
Potassium: 4.2 mmol/L (ref 3.5–5.2)
Sodium: 140 mmol/L (ref 134–144)
Total Protein: 7.1 g/dL (ref 6.0–8.5)

## 2018-06-18 LAB — ALPHA-1 ANTITRYPSIN PHENOTYPE: A-1 Antitrypsin: 172 mg/dL (ref 90–200)

## 2018-06-18 LAB — CERULOPLASMIN: Ceruloplasmin: 31.4 mg/dL — ABNORMAL HIGH (ref 16.0–31.0)

## 2018-06-18 LAB — ANTI-MICROSOMAL ANTIBODY LIVER / KIDNEY: LKM1 Ab: 0.9 Units (ref 0.0–20.0)

## 2018-06-19 ENCOUNTER — Telehealth: Payer: Self-pay | Admitting: Gastroenterology

## 2018-06-19 NOTE — Telephone Encounter (Signed)
Patient calling for u/s results.

## 2018-06-25 ENCOUNTER — Telehealth: Payer: Self-pay | Admitting: Gastroenterology

## 2018-06-25 NOTE — Telephone Encounter (Signed)
Pt  Wife is calling for Jackelyn Poling she would like for Debbie to call her she has questions about pt diagnisis of Cirrhiosis what stage it is in and if it was compsenated or decompensated also if he needs to do a biopsy and if Jackelyn Poling could speak with pt about stop drinking bc patient feels he just needs to cut back and not completely stop. Pt wife is very worried

## 2018-06-26 NOTE — Telephone Encounter (Signed)
Pt and wife to make an appt to be seen in clinic and discuss. Soonest appt 07/27/18 was scheduled.

## 2018-06-27 ENCOUNTER — Telehealth: Payer: Self-pay | Admitting: Family Medicine

## 2018-06-27 NOTE — Telephone Encounter (Signed)
Opened in error

## 2018-07-02 ENCOUNTER — Ambulatory Visit: Payer: Medicare Other | Admitting: Gastroenterology

## 2018-07-10 ENCOUNTER — Ambulatory Visit (INDEPENDENT_AMBULATORY_CARE_PROVIDER_SITE_OTHER): Payer: Medicare Other | Admitting: Gastroenterology

## 2018-07-10 ENCOUNTER — Encounter

## 2018-07-10 ENCOUNTER — Encounter: Payer: Self-pay | Admitting: Gastroenterology

## 2018-07-10 VITALS — BP 115/68 | HR 83 | Ht 71.5 in | Wt 197.6 lb

## 2018-07-10 DIAGNOSIS — K7030 Alcoholic cirrhosis of liver without ascites: Secondary | ICD-10-CM | POA: Diagnosis not present

## 2018-07-10 NOTE — Progress Notes (Signed)
Dwayne Antigua, MD [address]  [place]  [place], [place] [address]  Main: [phone]  Fax: [phone]   Primary Care Physician: Jerrol Banana., MD  Primary Gastroenterologist:  Dr. Vonda Thompson  Chief Complaint  Patient presents with  . Follow-up    discuss diagnosis    HPI: Dwayne Thompson is a 72 y.o. male with history of GI bleed and H. pylori, daily alcohol use, with ultrasound showing cirrhosis here for follow-up. The patient denies abdominal or flank pain, anorexia, nausea or vomiting, dysphagia, change in bowel habits or black or bloody stools or weight loss.  Reports drinking 2-3 drinks of liquor daily for years.  His recent EGD did not reveal any varices.  No episodes of confusion.  No recent GI bleeding.  The diagnosis of cirrhosis began with him noted to be thrombocytopenic on blood work, which led to an ultrasound that showed cirrhosis.  His liver enzymes and INR are normal.  Meld of 7 in September 2019.  Previous history: Patient was seen for hospitalization consult on April 15 due to melena. He also had history of ibuprofen use prior to the hospitalization. He underwent extensive work-up. EGD on April 16 showed erythematous mucosa in the antrum, duodenum. Nodular erythematous mucosa in the duodenum was biopsied and clipped. No active bleeding was seen during the EGD. Colonoscopy on April 17 showed melena but no red blood. No actively bleeding lesions. This was consistent with a more proximal GI source of bleeding. He underwent push enteroscopy that showed same findings as  previous EGD, and no actively bleeding lesions. Normal duodenum. Capsule endoscopy was performed and did not identify specific source of bleeding. However, small nonspecific red blood strand was seen in the small bowel, which could represent an oozing AVM (however, no AVMs were identified on the pill camera itself).  Gastric biopsies showed H. pylori, and hehas  completed H. pylori triple therapy.  Patient underwent repeat colonoscopy and EGD on April 05, 2018 It reported 2 tongues of salmon-colored mucosa, maximal longitudinal extent 1 cm.  Biopsy. Gastric erythema.  Biopsied. mild erythema duodenal bulb.  Biopsied.  Colonoscopy with normal terminal ileum.  4 less than 1 cm polyps removed from the ascending colon and sigmoid colon, nonbleeding internal hemorrhoids, diverticulosis.  Biopsies did not show any evidence of H. pylori.  Antral atrophy was reported.  GE junction biopsies negative for goblet cells, dysplasia, and reported reflux esophagitis.  Colon polyps showed tubular adenoma.  Current Outpatient Medications  Medication Sig Dispense Refill  . acetaminophen (TYLENOL) 325 MG tablet Take 2 tablets (650 mg total) by mouth 3 (three) times daily. 180 tablet 0  . aspirin EC 81 MG EC tablet Take 1 tablet (81 mg total) by mouth daily. 180 tablet 0  . Cholecalciferol 1000 UNITS tablet Take 1,000 Units by mouth daily. Reported on 03/03/2016    . doxazosin (CARDURA) 4 MG tablet TAKE 1 TABLET DAILY 90 tablet 3  . folic acid (FOLVITE) 1 MG tablet Take 1 tablet (1 mg total) by mouth daily. 180 tablet 0  . lisinopril (PRINIVIL,ZESTRIL) 10 MG tablet Take 1 tablet (10 mg total) by mouth daily. 90 tablet 3  . pantoprazole (PROTONIX) 20 MG tablet TAKE (1) TABLET TWICE A DAY. 60 tablet 0  . potassium chloride (K-DUR,KLOR-CON) 10 MEQ tablet Take 1 tablet (10 mEq total) by mouth 2 (two) times daily. 60 tablet 0  . rosuvastatin (CRESTOR) 10 MG tablet Take 1 tablet (10 mg total) by mouth  daily. 90 tablet 3  . valACYclovir (VALTREX) 1000 MG tablet Take 1 tablet (1,000 mg total) by mouth 3 (three) times daily. 21 tablet 0  . vitamin B-12 1000 MCG tablet Take 1 tablet (1,000 mcg total) by mouth daily. 180 tablet 1  . cetirizine (ZYRTEC) 10 MG tablet Take 1 tablet (10 mg total) by mouth daily. (Patient not taking: Reported on 07/10/2018) 30 tablet 0   No current  facility-administered medications for this visit.     Allergies as of 07/10/2018 - Review Complete 07/10/2018  Allergen Reaction Noted  . Tamsulosin Hives 06/12/2018  . Other Rash 06/12/2018  . Tamsulosin hcl Hives 03/28/2015  . Sulfa antibiotics Rash 01/02/2018    ROS:  General: Negative for anorexia, weight loss, fever, chills, fatigue, weakness. ENT: Negative for hoarseness, difficulty swallowing , nasal congestion. CV: Negative for chest pain, angina, palpitations, dyspnea on exertion, peripheral edema.  Respiratory: Negative for dyspnea at rest, dyspnea on exertion, cough, sputum, wheezing.  GI: See history of present illness. GU:  Negative for dysuria, hematuria, urinary incontinence, urinary frequency, nocturnal urination.  Endo: Negative for unusual weight change.    Physical Examination:   BP 115/68   Pulse 83   Ht 5' 11.5" (1.816 m)   Wt 197 lb 9.6 oz (89.6 kg)   BMI 27.18 kg/m   General: Well-nourished, well-developed in no acute distress.  Eyes: No icterus. Conjunctivae pink. Mouth: Oropharyngeal mucosa moist and pink , no lesions erythema or exudate. Neck: Supple, Trachea midline Abdomen: Bowel sounds are normal, nontender, nondistended, no hepatosplenomegaly or masses, no abdominal bruits or hernia , no rebound or guarding.   Extremities: No lower extremity edema. No clubbing or deformities. Neuro: Alert and oriented x 3.  Grossly intact. Skin: Warm and dry, no jaundice.   Psych: Alert and cooperative, normal mood and affect.   Labs: CMP     Component Value Date/Time   NA 140 06/15/2018 0840   K 4.2 06/15/2018 0840   CL 98 06/15/2018 0840   CO2 25 06/15/2018 0840   GLUCOSE 93 06/15/2018 0840   GLUCOSE 96 01/02/2018 0519   BUN 11 06/15/2018 0840   CREATININE 0.95 06/15/2018 0840   CALCIUM 9.4 06/15/2018 0840   PROT 7.1 06/15/2018 0840   ALBUMIN 4.4 06/15/2018 0840   AST 16 06/15/2018 0840   ALT 12 06/15/2018 0840   ALKPHOS 51 06/15/2018 0840    BILITOT 0.4 06/15/2018 0840   GFRNONAA 80 06/15/2018 0840   GFRAA 92 06/15/2018 0840   Lab Results  Component Value Date   WBC 5.8 05/16/2018   HGB 14.9 05/16/2018   HCT 44.1 05/16/2018   MCV 92.6 05/16/2018   PLT 98 (L) 05/16/2018    Imaging Studies: US Abdomen Limited Ruq  Result Date: 06/15/2018 CLINICAL DATA:  Alcoholic cirrhosis. EXAM: ULTRASOUND ABDOMEN LIMITED RIGHT UPPER QUADRANT COMPARISON:  Abdominal ultrasound 01/03/2018 FINDINGS: Gallbladder: Gallbladder has a normal appearance. Gallbladder wall is 2.7 millimeters, within normal limits. No stones or pericholecystic fluid. No sonographic Murphy's sign. Common bile duct: Diameter: 4.3 millimeters Liver: Mildly nodular anterior surface of the liver. A simple cyst in the LEFT hepatic lobe is 1.2 x 1.1 x 1.0 centimeters. Simple cyst in the RIGHT hepatic lobe is 1.8 x 1.5 x 1.8 centimeters. No solid mass or evidence for hepatic steatosis. Portal vein is patent on color Doppler imaging with normal direction of blood flow towards the liver. IMPRESSION: 1. Minimally nodular surface contour of the liver is consistent with history of cirrhosis.  2. Small cysts within the RIGHT and LEFT hepatic lobes. No evidence for hepatocellular carcinoma. 3. Normal gallbladder. Electronically Signed   By: Nolon Nations M.D.   On: 06/15/2018 10:31    Assessment and Plan:   KILIAN SCHWARTZ is a 72 y.o. y/o male with daily history of alcohol use, with new diagnosis of cirrhosis based on ultrasound and thrombocytopenia, history of GI bleed with melena in April 2019 with no specific source identified at the time despite EGD, enteroscopy, colonoscopy, small bowel capsule, with nonspecific red spots seen on capsule study, and history of H. pylori now status post treatment eradication  Cirrhosis Patient family educated extensively against alcohol use Abstain from all alcohol use Avoid hepatotoxic drugs including herbal products or over-the-counter  supplements We will order FibroSure level to confirm fibrosis stage and to see if it is consistent with cirrhosis Meld 7 on September 2019 labs Normal INR, normal liver enzymes, normal liver function at this time Patient educated about the risks of decompensation and continued alcohol use and verbalized understanding Patient states he is up-to-date on pneumonia and flu vaccine We will check hepatitis A and B immunity and vaccinate if negative Autoimmune liver work-up negative  H. pylori Eradicated with triple therapy  Anemia and thrombus cytopenia Patient following with Dr. Mike Gip  Gastric atrophy on biopsies Likely due to H. pylori and likely to improve since it has been eradicated Can repeat biopsies in 2 to 3 years with repeat biopsies  Colonoscopy up-to-date, repeat recommended in 5 years from July 2019   Dr Dwayne Thompson

## 2018-07-11 LAB — AFP TUMOR MARKER: AFP, Serum, Tumor Marker: 3.4 ng/mL (ref 0.0–8.3)

## 2018-07-11 LAB — HEPATITIS A ANTIBODY, IGM: Hep A IgM: NEGATIVE

## 2018-07-11 LAB — HEPATITIS B SURFACE ANTIBODY,QUALITATIVE: Hep B Surface Ab, Qual: NONREACTIVE

## 2018-07-11 LAB — HEPATITIS A ANTIBODY, TOTAL: Hep A Total Ab: NEGATIVE

## 2018-07-12 ENCOUNTER — Encounter: Payer: Self-pay | Admitting: Family Medicine

## 2018-07-12 ENCOUNTER — Ambulatory Visit (INDEPENDENT_AMBULATORY_CARE_PROVIDER_SITE_OTHER): Payer: Medicare Other | Admitting: Family Medicine

## 2018-07-12 VITALS — BP 126/70 | HR 64 | Temp 98.4°F | Resp 16 | Wt 195.0 lb

## 2018-07-12 DIAGNOSIS — N401 Enlarged prostate with lower urinary tract symptoms: Secondary | ICD-10-CM

## 2018-07-12 DIAGNOSIS — R350 Frequency of micturition: Secondary | ICD-10-CM | POA: Diagnosis not present

## 2018-07-12 DIAGNOSIS — N529 Male erectile dysfunction, unspecified: Secondary | ICD-10-CM

## 2018-07-12 LAB — ASH FIBROSURE
ALPHA 2-MACROGLOBULINS, QN: 274 mg/dL (ref 110–276)
ALT (SGPT) P5P: 14 IU/L (ref 0–55)
ASH Score: 0.1 (ref 0.00–17.00)
AST (SGOT) P5P: 26 IU/L (ref 0–40)
Apolipoprotein A-1: 134 mg/dL (ref 101–178)
Bilirubin, Total: 0.2 mg/dL (ref 0.0–1.2)
Cholesterol, Total: 107 mg/dL (ref 100–199)
Fibrosis Score: 0.39 — ABNORMAL HIGH (ref 0.00–0.21)
GGT: 56 IU/L (ref 0–65)
Glucose: 96 mg/dL (ref 65–99)
Haptoglobin: 174 mg/dL (ref 34–200)
Height: 71 in
Steatosis Score: 0.31 — ABNORMAL HIGH (ref 0.00–0.30)
Triglycerides: 40 mg/dL (ref 0–149)
Weight: 197 [lb_av]

## 2018-07-12 NOTE — Progress Notes (Signed)
Patient: Dwayne Thompson Male    DOB: 1946-05-14   72 y.o.   MRN: [id number] Visit Date: 07/12/2018  Today's Provider: [name], MD   Chief Complaint  Patient presents with  . Prostate issues   Subjective:    HPI Patient comes in today wanting to discuss prostate issues. He reports that he was told that his prostate was enlarged, and now he reports that he is having to go to the bathroom more frequently and only a small amount of urine comes out. Patient reports that he has to get up at least 4 times nightly to go to the bathroom.  He also has a significant concern of severe and worsening ED over the past few years.  He has almost no erectile function.    Allergies  Allergen Reactions  . Tamsulosin Hives  . Other Rash  . Tamsulosin Hcl Hives  . Sulfa Antibiotics Rash     Current Outpatient Medications:  .  acetaminophen (TYLENOL) 325 MG tablet, Take 2 tablets (650 mg total) by mouth 3 (three) times daily., Disp: 180 tablet, Rfl: 0 .  aspirin EC 81 MG EC tablet, Take 1 tablet (81 mg total) by mouth daily., Disp: 180 tablet, Rfl: 0 .  cetirizine (ZYRTEC) 10 MG tablet, Take 1 tablet (10 mg total) by mouth daily. (Patient not taking: Reported on 07/10/2018), Disp: 30 tablet, Rfl: 0 .  Cholecalciferol 1000 UNITS tablet, Take 1,000 Units by mouth daily. Reported on 03/03/2016, Disp: , Rfl:  .  doxazosin (CARDURA) 4 MG tablet, TAKE 1 TABLET DAILY, Disp: 90 tablet, Rfl: 3 .  folic acid (FOLVITE) 1 MG tablet, Take 1 tablet (1 mg total) by mouth daily., Disp: 180 tablet, Rfl: 0 .  lisinopril (PRINIVIL,ZESTRIL) 10 MG tablet, Take 1 tablet (10 mg total) by mouth daily., Disp: 90 tablet, Rfl: 3 .  pantoprazole (PROTONIX) 20 MG tablet, TAKE (1) TABLET TWICE A DAY., Disp: 60 tablet, Rfl: 0 .  potassium chloride (K-DUR,KLOR-CON) 10 MEQ tablet, Take 1 tablet (10 mEq total) by mouth 2 (two) times daily., Disp: 60 tablet, Rfl: 0 .  rosuvastatin (CRESTOR) 10 MG tablet, Take 1 tablet (10  mg total) by mouth daily., Disp: 90 tablet, Rfl: 3 .  valACYclovir (VALTREX) 1000 MG tablet, Take 1 tablet (1,000 mg total) by mouth 3 (three) times daily., Disp: 21 tablet, Rfl: 0 .  vitamin B-12 1000 MCG tablet, Take 1 tablet (1,000 mcg total) by mouth daily., Disp: 180 tablet, Rfl: 1  Review of Systems  Constitutional: Negative.   Eyes: Negative.   Respiratory: Negative for cough and shortness of breath.   Cardiovascular: Negative for chest pain, palpitations and leg swelling.  Gastrointestinal: Negative.   Endocrine: Negative.   Genitourinary: Positive for decreased urine volume, frequency and urgency. Negative for difficulty urinating, discharge, dysuria, flank pain, hematuria, penile swelling, scrotal swelling and testicular pain.  Musculoskeletal: Positive for myalgias.  Allergic/Immunologic: Negative.   Psychiatric/Behavioral: Negative.     Social History   Tobacco Use  . Smoking status: Former Smoker    Packs/day: 1.00    Years: 50.00    Pack years: 50.00    Types: Cigarettes    Last attempt to quit: 10/20/2013    Years since quitting: 4.7  . Smokeless tobacco: Never Used  Substance Use Topics  . Alcohol use: Yes    Alcohol/week: 21.0 - 28.0 standard drinks    Types: 21 - 28 Shots of liquor per week  Comment: 3-4 gin and sodas a day   Objective:   BP 126/70 (BP Location: Right Arm, Patient Position: Sitting, Cuff Size: Normal)   Pulse 64   Temp 98.4 F (36.9 C)   Resp 16   Wt 195 lb (88.5 kg)   SpO2 98%   BMI 26.82 kg/m  Vitals:   07/12/18 1558  BP: 126/70  Pulse: 64  Resp: 16  Temp: 98.4 F (36.9 C)  SpO2: 98%  Weight: 195 lb (88.5 kg)     Physical Exam  Constitutional: He is oriented to person, place, and time. He appears well-developed and well-nourished.  HENT:  Head: Normocephalic and atraumatic.  Eyes: Conjunctivae are normal. No scleral icterus.  Neck: No thyromegaly present.  Cardiovascular: Normal rate, regular rhythm and normal heart  sounds.  Pulmonary/Chest: Effort normal and breath sounds normal.  Abdominal: Soft.  Musculoskeletal: He exhibits no edema.  Neurological: He is alert and oriented to person, place, and time.  Skin: Skin is warm and dry.  Psychiatric: He has a normal mood and affect. His behavior is normal. Judgment and thought content normal.        Assessment & Plan:     1. Benign prostatic hyperplasia with urinary frequency  - PSA - Ambulatory referral to Urology  2. Erectile dysfunction of organic origin Discussed  next step must be through urology.      I have done the exam and reviewed the above chart and it is accurate to the best of my knowledge. Development worker, community has been used in this note in any air is in the dictation or transcription are unintentional.  [name], MD  [facility]

## 2018-07-14 LAB — PSA: Prostate Specific Ag, Serum: 4.7 ng/mL — ABNORMAL HIGH (ref 0.0–4.0)

## 2018-07-16 ENCOUNTER — Other Ambulatory Visit: Payer: Self-pay | Admitting: Family Medicine

## 2018-07-16 NOTE — Telephone Encounter (Signed)
[facility] faxed refill request for the following medications:  cetirizine (ZYRTEC) 10 MG tablet  Qty: 30  Please advise.

## 2018-07-17 ENCOUNTER — Telehealth: Payer: Self-pay

## 2018-07-17 MED ORDER — CETIRIZINE HCL 10 MG PO TABS: 10.0000 mg | ORAL_TABLET | Freq: Every day | ORAL | 11 refills | Status: DC

## 2018-07-17 NOTE — Telephone Encounter (Signed)
Pt advised.   Thanks,   -Laura  

## 2018-07-17 NOTE — Telephone Encounter (Signed)
-----   Message from Jerrol Banana., MD sent at 07/16/2018  2:23 PM EDT ----- PSA up a little--keep appt with urology.

## 2018-07-24 ENCOUNTER — Other Ambulatory Visit: Payer: Self-pay | Admitting: Family Medicine

## 2018-08-06 ENCOUNTER — Ambulatory Visit: Payer: Medicare Other | Admitting: Gastroenterology

## 2018-08-07 ENCOUNTER — Other Ambulatory Visit: Payer: Self-pay

## 2018-08-07 ENCOUNTER — Ambulatory Visit (INDEPENDENT_AMBULATORY_CARE_PROVIDER_SITE_OTHER): Payer: Medicare Other | Admitting: Urology

## 2018-08-07 ENCOUNTER — Encounter: Payer: Self-pay | Admitting: Urology

## 2018-08-07 VITALS — BP 132/81 | HR 73 | Wt 203.0 lb

## 2018-08-07 DIAGNOSIS — N400 Enlarged prostate without lower urinary tract symptoms: Secondary | ICD-10-CM | POA: Diagnosis not present

## 2018-08-07 LAB — MICROSCOPIC EXAMINATION
Bacteria, UA: NONE SEEN
Epithelial Cells (non renal): NONE SEEN /hpf (ref 0–10)
WBC, UA: NONE SEEN /hpf (ref 0–5)

## 2018-08-07 LAB — URINALYSIS, COMPLETE
Bilirubin, UA: NEGATIVE
Glucose, UA: NEGATIVE
Ketones, UA: NEGATIVE
Leukocytes, UA: NEGATIVE
Nitrite, UA: NEGATIVE
Protein, UA: NEGATIVE
RBC, UA: NEGATIVE
Specific Gravity, UA: 1.02 (ref 1.005–1.030)
Urobilinogen, Ur: 1 mg/dL (ref 0.2–1.0)
pH, UA: 5.5 (ref 5.0–7.5)

## 2018-08-07 LAB — BLADDER SCAN AMB NON-IMAGING: Scan Result: 28

## 2018-08-07 MED ORDER — OXYBUTYNIN CHLORIDE ER 10 MG PO TB24: 10.0000 mg | ORAL_TABLET | Freq: Every day | ORAL | 11 refills | Status: DC

## 2018-08-07 MED ORDER — TADALAFIL 20 MG PO TABS: 20.0000 mg | ORAL_TABLET | Freq: Every day | ORAL | 11 refills | Status: DC | PRN

## 2018-08-07 NOTE — Progress Notes (Addendum)
08/07/2018 1:55 PM   [name] 30-May-1946 [id number]  Reason for visit: Follow up "enlarged prostate"  HPI: I saw Mr. Ottey in urology clinic today to discuss his enlarged prostate.  He is a 72 year old male with alcoholic cirrhosis who was previously followed by our PA [name] for erectile dysfunction.  He denies family history of prostate cancer.  He reportedly was told recently that he had a very enlarged prostate on CT scan.  The CT scan is not in our system for me to be able to review today.  His most recent PSA is 4.7 in October 2019.  His voiding symptoms are primarily frequency, urgency with occasional urge incontinence, intermittent stream, and nocturia 3-4 times per night.  Notably, he drinks 4-5 alcoholic beverages per day that are liquor mixed with Pepsi.  He also drinks coffee in the morning.  He is on doxazosin currently for urinary symptoms.  There are no aggravating or alleviating factors.  Duration is the last few years, but worsened over the last 6 to 12 months.  Severity is moderate.  PVR in clinic today is 28 cc.  He also reports difficulty with erections and has had little improvement with Viagra over the last few months.  ROS: Please see flowsheet from today's date for complete review of systems.  Physical Exam: BP 132/81   Pulse 73   Wt 203 lb (92.1 kg)   BMI 27.92 kg/m    Constitutional:  Alert and oriented, No acute distress. Respiratory: Normal respiratory effort, no increased work of breathing. GI: Abdomen is soft, nontender, nondistended, no abdominal masses GU: No CVA tenderness, uncircumcised phallus without lesions, widely patent meatus DRE: Large prostate, difficult to fully appreciate, no nodules at apex Skin: No rashes, bruises or suspicious lesions. Neurologic: Grossly intact, no focal deficits, moving all 4 extremities. Psychiatric: Normal mood and affect  Laboratory Data: PSA 4.7  Pertinent Imaging: None to review, will obtain  outside CT scan   Assessment & Plan:   In summary, Mr. Baack is a 72 year old African-American male with primarily overactive symptoms, and low PVR here in clinic today.  He does consume quite a bit of alcohol and soda during the day, which likely exacerbates his symptoms.  We discussed the definitive study to determine if his symptoms are secondary to overactive bladder versus bladder outlet obstruction from the prostate would be urodynamics.  He is hesitant to undergo this.    We discussed that overactive bladder (OAB) is not a disease, but is a symptom complex that is generally not life-threatening.  Symptoms typically include urinary urgency, frequency, and urge incontinence.  There are numerous treatment options, however there are risks and benefits with both medical and surgical management.  First-line treatment is behavioral therapies including bladder training, pelvic floor muscle training, and fluid management.  Second line treatments include oral antimuscarinics(Ditropan er, Trospium) and beta-3 agonist (Mybetriq). There is typically a period of medication trial (4-8 weeks) to find the optimal therapy and dosing. If symptoms are bothersome despite the above management, third line options include intra-detrusor botox, peripheral tibial nerve stimulation (PTNS), and interstim (SNS). These are more invasive treatments with higher side effect profile, but may improve quality of life for patients with severe OAB symptoms.   Trial of ditropan XL and behavioral modifications for OAB  Consider Urodynamics in future if not improved  Obtain outside CT to determine prostate volume Trial of cialis 20mg  PRN for ED  Return in about 6 weeks (around 09/18/2018) for  PVR, IPSS.   ADDENDUM: Prostate measures 90cc on outside MRI pelvis  Billey Co, MD  [facility] [address] [place], [place] [address] [phone]

## 2018-08-15 ENCOUNTER — Inpatient Hospital Stay: Payer: Medicare Other

## 2018-08-15 ENCOUNTER — Other Ambulatory Visit: Payer: Self-pay

## 2018-08-15 ENCOUNTER — Other Ambulatory Visit: Payer: Self-pay | Admitting: Hematology and Oncology

## 2018-08-15 ENCOUNTER — Inpatient Hospital Stay: Payer: Medicare Other | Attending: Hematology and Oncology | Admitting: Hematology and Oncology

## 2018-08-15 ENCOUNTER — Encounter: Payer: Self-pay | Admitting: Hematology and Oncology

## 2018-08-15 VITALS — BP 124/82 | HR 89 | Temp 98.7°F | Wt 206.9 lb

## 2018-08-15 DIAGNOSIS — Z7982 Long term (current) use of aspirin: Secondary | ICD-10-CM | POA: Diagnosis not present

## 2018-08-15 DIAGNOSIS — D500 Iron deficiency anemia secondary to blood loss (chronic): Secondary | ICD-10-CM

## 2018-08-15 DIAGNOSIS — Z87891 Personal history of nicotine dependence: Secondary | ICD-10-CM | POA: Insufficient documentation

## 2018-08-15 DIAGNOSIS — Z8619 Personal history of other infectious and parasitic diseases: Secondary | ICD-10-CM | POA: Diagnosis not present

## 2018-08-15 DIAGNOSIS — K922 Gastrointestinal hemorrhage, unspecified: Secondary | ICD-10-CM | POA: Diagnosis not present

## 2018-08-15 DIAGNOSIS — E538 Deficiency of other specified B group vitamins: Secondary | ICD-10-CM | POA: Insufficient documentation

## 2018-08-15 DIAGNOSIS — Z79899 Other long term (current) drug therapy: Secondary | ICD-10-CM

## 2018-08-15 DIAGNOSIS — D696 Thrombocytopenia, unspecified: Secondary | ICD-10-CM | POA: Diagnosis not present

## 2018-08-15 LAB — CBC WITH DIFFERENTIAL/PLATELET
Abs Immature Granulocytes: 0.03 10*3/uL (ref 0.00–0.07)
Basophils Absolute: 0 10*3/uL (ref 0.0–0.1)
Basophils Relative: 0 %
Eosinophils Absolute: 0.2 10*3/uL (ref 0.0–0.5)
Eosinophils Relative: 4 %
HCT: 37.2 % — ABNORMAL LOW (ref 39.0–52.0)
Hemoglobin: 12.9 g/dL — ABNORMAL LOW (ref 13.0–17.0)
Immature Granulocytes: 1 %
Lymphocytes Relative: 22 %
Lymphs Abs: 1.3 10*3/uL (ref 0.7–4.0)
MCH: 31.3 pg (ref 26.0–34.0)
MCHC: 34.7 g/dL (ref 30.0–36.0)
MCV: 90.3 fL (ref 80.0–100.0)
Monocytes Absolute: 0.5 10*3/uL (ref 0.1–1.0)
Monocytes Relative: 9 %
Neutro Abs: 3.8 10*3/uL (ref 1.7–7.7)
Neutrophils Relative %: 64 %
Platelets: 124 10*3/uL — ABNORMAL LOW (ref 150–400)
RBC: 4.12 MIL/uL — ABNORMAL LOW (ref 4.22–5.81)
RDW: 14.3 % (ref 11.5–15.5)
WBC: 5.9 10*3/uL (ref 4.0–10.5)
nRBC: 0 % (ref 0.0–0.2)

## 2018-08-15 LAB — FOLATE: Folate: 25 ng/mL (ref >5.9)

## 2018-08-15 LAB — FERRITIN: Ferritin: 197 ng/mL (ref 24–336)

## 2018-08-15 NOTE — Progress Notes (Signed)
[facility] day:  08/15/2018  Chief Complaint: Dwayne Thompson is a 72 y.o. 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  HPI:  The patient was last seen in the medical oncology clinic on 05/16/2018.  At that time, he felt fine.  He had blisters on his legs for which he was prescribed Zyrtec and valacyclovir.  He was not taking folic acid.  He had not cut back on his alcohol intake.  Hematocrit was 44.1, hemoglobin 14.9, MCV 92.6, and platelets 98,000.  Ferritin was 79.  Iron saturation was 31% with a TIBC of 368.  B12 was 458.  Folate was 7.9.    Symptomatically, patient is doing well. He notes that the blistering to his lower extremities has resolved, however his legs remain pruritic. Patient denies any acute complaints. Patient denies that he has experienced any B symptoms. He denies any interval infections.   Patient is taking his supplemental folic acid as recommended. He continues to drink alcohol, however he has attempted to reduce by drinking more "non-alcoholic beers". He denies any nausea, vomiting, or changes to his bowel habits. Patient denies bleeding; no hematochezia, melena, or gross hematuria.   Patient advises that he maintains an adequate appetite. He is eating well. Weight today is 206 lb 14.4 oz (93.9 kg), which compared to his last visit to the clinic, represents an 11 pound increase.   Patient denies pain in the clinic today.   Past Medical History:  Diagnosis Date  . Anemia   . Back pain   . BPH (benign prostatic hyperplasia)   . ED (erectile dysfunction)   . Hyperlipidemia     Past Surgical History:  Procedure Laterality Date  . COLONOSCOPY Left 01/03/2018   Procedure: COLONOSCOPY;  Surgeon: Virgel Manifold, MD;  Location: [facility] ENDOSCOPY;  Service: Endoscopy;  Laterality: Left;  . COLONOSCOPY WITH PROPOFOL N/A 04/05/2018   Procedure: COLONOSCOPY WITH  PROPOFOL;  Surgeon: Virgel Manifold, MD;  Location: [facility] ENDOSCOPY;  Service: Endoscopy;  Laterality: N/A;  . ENTEROSCOPY  01/03/2018   Procedure: ENTEROSCOPY;  Surgeon: Virgel Manifold, MD;  Location: [facility] ENDOSCOPY;  Service: Endoscopy;;  . ESOPHAGOGASTRODUODENOSCOPY N/A 01/02/2018   Procedure: ESOPHAGOGASTRODUODENOSCOPY (EGD);  Surgeon: Virgel Manifold, MD;  Location: [facility] ENDOSCOPY;  Service: Endoscopy;  Laterality: N/A;  . ESOPHAGOGASTRODUODENOSCOPY (EGD) WITH PROPOFOL N/A 04/05/2018   Procedure: ESOPHAGOGASTRODUODENOSCOPY (EGD) WITH PROPOFOL;  Surgeon: Virgel Manifold, MD;  Location: [facility] ENDOSCOPY;  Service: Endoscopy;  Laterality: N/A;  . HERNIA REPAIR     1980's    Family History  Problem Relation Age of Onset  . Alzheimer's disease Mother   . Sarcoidosis Sister   . Diabetes Brother   . Hypertension Brother   . Anxiety disorder Brother   . Kidney disease Neg Hx   . Prostate cancer Neg Hx     Social History:  reports that he quit smoking about 4 years ago. His smoking use included cigarettes. He has a 50.00 pack-year smoking history. He has never used smokeless tobacco. He reports that he drinks about 21.0 - 28.0 standard drinks of alcohol per week. He reports that he does not use drugs.  He drinks 2-3 alcohol shots/day.  His wife's name is [name]  and granddaughter's name is Chemical engineer.  He is alone today.  Allergies:  Allergies  Allergen Reactions  . Tamsulosin Hives  . Other Rash  .  Tamsulosin Hcl Hives  . Sulfa Antibiotics Rash    Current Medications: Current Outpatient Medications  Medication Sig Dispense Refill  . acetaminophen (TYLENOL) 325 MG tablet Take 2 tablets (650 mg total) by mouth 3 (three) times daily. 180 tablet 0  . aspirin EC 81 MG EC tablet Take 1 tablet (81 mg total) by mouth daily. 180 tablet 0  . cetirizine (ZYRTEC) 10 MG tablet Take 1 tablet (10 mg total) by mouth daily. 30 tablet 11  . Cholecalciferol 1000 UNITS tablet Take 1,000  Units by mouth daily. Reported on 03/03/2016    . doxazosin (CARDURA) 4 MG tablet TAKE 1 TABLET DAILY 90 tablet 3  . folic acid (FOLVITE) 1 MG tablet Take 1 tablet (1 mg total) by mouth daily. 180 tablet 0  . lisinopril (PRINIVIL,ZESTRIL) 10 MG tablet Take 1 tablet (10 mg total) by mouth daily. 90 tablet 3  . oxybutynin (DITROPAN-XL) 10 MG 24 hr tablet Take 1 tablet (10 mg total) by mouth daily. 30 tablet 11  . pantoprazole (PROTONIX) 20 MG tablet TAKE (1) TABLET TWICE A DAY. 60 tablet 0  . potassium chloride (K-DUR,KLOR-CON) 10 MEQ tablet Take 1 tablet (10 mEq total) by mouth 2 (two) times daily. 60 tablet 5  . rosuvastatin (CRESTOR) 10 MG tablet Take 1 tablet (10 mg total) by mouth daily. 90 tablet 3  . tadalafil (ADCIRCA/CIALIS) 20 MG tablet Take 1 tablet (20 mg total) by mouth daily as needed for erectile dysfunction. 15 tablet 11  . valACYclovir (VALTREX) 1000 MG tablet Take 1 tablet (1,000 mg total) by mouth 3 (three) times daily. 21 tablet 0  . vitamin B-12 1000 MCG tablet Take 1 tablet (1,000 mcg total) by mouth daily. 180 tablet 1   No current facility-administered medications for this visit.     Review of Systems  Constitutional: Positive for malaise/fatigue. Negative for diaphoresis, fever and weight loss (up 11 pounds).  HENT: Negative.   Eyes: Negative.   Respiratory: Negative for cough, hemoptysis, sputum production and shortness of breath.   Cardiovascular: Negative for chest pain, palpitations, orthopnea, leg swelling and PND.  Gastrointestinal: Negative for abdominal pain, blood in stool, constipation, diarrhea, melena, nausea and vomiting.  Genitourinary: Negative for dysuria, frequency, hematuria and urgency.  Musculoskeletal: Negative for back pain, falls, joint pain and myalgias.  Skin: Positive for itching (BLE). Negative for rash.  Neurological: Negative for dizziness, tremors, weakness and headaches.  Endo/Heme/Allergies: Does not bruise/bleed easily.   Psychiatric/Behavioral: Negative for depression, memory loss and suicidal ideas. The patient is not nervous/anxious and does not have insomnia.   All other systems reviewed and are negative.  Performance status (ECOG): 1 - Symptomatic but completely ambulatory  Vital Signs BP 124/82 (BP Location: Left Arm, Patient Position: Sitting)   Pulse 89   Temp 98.7 F (37.1 C) (Tympanic)   Wt 206 lb 14.4 oz (93.9 kg)   BMI 28.45 kg/m   Physical Exam  Constitutional: He is oriented to person, place, and time and well-developed, well-nourished, and in no distress. No distress.  HENT:  Head: Normocephalic and atraumatic.  Mouth/Throat: Oropharynx is clear and moist and mucous membranes are normal.  Short brown hair with graying mustache.  Eyes: Pupils are equal, round, and reactive to light. Conjunctivae and EOM are normal. No scleral icterus.  Glasses.  Brown eyes.  Neck: Normal range of motion. Neck supple. No JVD present.  Cardiovascular: Normal rate, regular rhythm, normal heart sounds and intact distal pulses. Exam reveals no gallop and no  friction rub.  No murmur heard. Pulmonary/Chest: Effort normal and breath sounds normal. No respiratory distress. He has no wheezes. He has no rales.  Abdominal: Soft. Bowel sounds are normal. He exhibits no distension and no mass. There is no abdominal tenderness. There is no rebound and no guarding.  Musculoskeletal: Normal range of motion. He exhibits no edema or tenderness.  Lymphadenopathy:    He has no cervical adenopathy.    He has no axillary adenopathy.       Right: No inguinal and no supraclavicular adenopathy present.       Left: No inguinal and no supraclavicular adenopathy present.  Neurological: He is alert and oriented to person, place, and time.  Skin: Skin is warm and dry. No rash noted. He is not diaphoretic. No erythema.  Psychiatric: Mood, affect and judgment normal.  Nursing note and vitals reviewed.   Appointment on 08/15/2018   Component Date Value Ref Range Status  . WBC 08/15/2018 5.9  4.0 - 10.5 K/uL Final  . RBC 08/15/2018 4.12* 4.22 - 5.81 MIL/uL Final  . Hemoglobin 08/15/2018 12.9* 13.0 - 17.0 g/dL Final  . HCT 08/15/2018 37.2* 39.0 - 52.0 % Final  . MCV 08/15/2018 90.3  80.0 - 100.0 fL Final  . MCH 08/15/2018 31.3  26.0 - 34.0 pg Final  . MCHC 08/15/2018 34.7  30.0 - 36.0 g/dL Final  . RDW 08/15/2018 14.3  11.5 - 15.5 % Final  . Platelets 08/15/2018 124* 150 - 400 K/uL Final  . nRBC 08/15/2018 0.0  0.0 - 0.2 % Final  . Neutrophils Relative % 08/15/2018 64  % Final  . Neutro Abs 08/15/2018 3.8  1.7 - 7.7 K/uL Final  . Lymphocytes Relative 08/15/2018 22  % Final  . Lymphs Abs 08/15/2018 1.3  0.7 - 4.0 K/uL Final  . Monocytes Relative 08/15/2018 9  % Final  . Monocytes Absolute 08/15/2018 0.5  0.1 - 1.0 K/uL Final  . Eosinophils Relative 08/15/2018 4  % Final  . Eosinophils Absolute 08/15/2018 0.2  0.0 - 0.5 K/uL Final  . Basophils Relative 08/15/2018 0  % Final  . Basophils Absolute 08/15/2018 0.0  0.0 - 0.1 K/uL Final  . Immature Granulocytes 08/15/2018 1  % Final  . Abs Immature Granulocytes 08/15/2018 0.03  0.00 - 0.07 K/uL Final   Performed at [facility], [address], [place], [place] [address]    Assessment:  Dwayne Thompson is a 72 y.o. 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  He has had mild chronic thrombocytopenia since 04/2017.  Platelet count has ranged between 101,000 - 134,000.  During his active GI bleed platelet nadir was 83,000.  He has B12 deficiency and folate deficiency.  B12 was 221 and folate 5.7 on 01/03/2018.  He is on oral Y85 and folic acid.  Diet is good.  He drinks alcohol daily.  Anemia and thrombocytopenia work-up on 01/04/2018 revealed the following normal studies: hepatitis B surface antigen, hepatitis B core antibody total, hepatitis C antibody, and ANA.  Ferritin was 272.  Iron saturation was 34% with a TIBC of  265.  HIV testing was negative on 01/10/2018.  Abdominal ultrasound on 01/03/2018 revealed a normal spleen.  Ferritin has been followed: 272 on 01/03/2018, 231 on 01/10/2018, 177 on 02/14/2018, 79 on 05/16/2018, and 197 on 08/15/2018.  Upper endoscopy on 01/02/2018 revealed a normal esophagus, erythematous mucosa in the antrum, erythematous duodenopathy, and nodular mucosa in the  duodenal bulb.  There was no ulcerations or active sites of bleeding.  Pathology revealed mild acute duodenitis, negative for dysplasia and malignancy.  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  There was no dysplasia or malignancy.  Colonoscopy on 01/03/2018 revealed melena in the entire colon c/w bleeding from the proximal GI tract.  Small bowel enteroscopy on 01/03/2018 revealed no actively bleeding lesions.  The examined portion of the jejunum was normal.    Capsule study on 01/03/2018 revealed a red lesion in the first 7% of the study likely representing duodenal erythema and nodularity noted on recent EGD.  There was a red spot at 78% with subsequent small red blood strands which could represent an oozing AVM in the distal small bowel.  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  Symptomatically,  he is doing well overall.  He continues to report some fatigue.  Denies exertional shortness of breath.  Patient has no B symptoms or interval infections.  He denies any bleeding.  Patient continues to drink alcohol on a daily basis, however he notes that he has made efforts to reduce the amount by drinking "nonalcoholic beers".  Exam is grossly unremarkable.  WBC 5900 ([place] 3800).  Hemoglobin 12.9, hematocrit 37.2, and platelets 124,000.  Plan:   1. Labs today:  CBC with diff, ferritin, B12, folate, copper. 2. Normocytic anemia  Labs reviewed.  Hemoglobin 12.9, hematocrit 37.2, and MCV 90.3.  Patient has a history of gastritis, duodenitis,  and small bowel AVMs.  Recheck ferritin as level was trending down when last checked in 04/2018.  ?? insidious GI blood loss.  Check copper level today.   Continue routine lab monitoring. 3. B12 deficiency  Continues on daily oral B12 1000 mcg supplementation.  Check repeat B12 level today. 4. Folate deficiency  Continues on daily folic acid 1 mg supplementation.  Check folate level today. 5. Thrombocytopenia  Platelets low at 124,000.  History of H. pylori (treated).  Element of consumption with any GI bleeding (documented).  Continues daily alcohol, which may be contributory.  Encouraged cessation.  Exam reveals no splenomegaly.  Continue routine lab monitoring and surveillance. 6. RTC in 3 months for labs (CBC with differential, ferritin). 7. RTC in 6 for MD assessment, labs (CBC with diff, ferritin, B12 - day before), and +/- Venofer.    Honor Loh, NP 08/15/2018, 4:50 PM   I saw and evaluated the patient, participating in the key portions of the service and reviewing pertinent diagnostic studies and records.  I reviewed the nurse practitioner's note and agree with the findings and the plan.  The assessment and plan were discussed with the patient.  Several questions were asked by the patient and answered.   Nolon Stalls, MD 08/15/2018,4:50 PM

## 2018-08-16 LAB — VITAMIN B12: Vitamin B-12: 518 pg/mL (ref 180–914)

## 2018-08-17 ENCOUNTER — Telehealth: Payer: Self-pay

## 2018-08-17 NOTE — Telephone Encounter (Signed)
Call pt regarding lung screening. Pt is a former smoker. Pt would like scan any morning any day except Dec. 9th and 19th. Pt is having surgery on his leg on Dec. 9th. Denies any new health issues.

## 2018-08-18 LAB — COPPER, SERUM: Copper: 109 ug/dL (ref 72–166)

## 2018-08-20 ENCOUNTER — Telehealth: Payer: Self-pay | Admitting: *Deleted

## 2018-08-20 DIAGNOSIS — Z87891 Personal history of nicotine dependence: Secondary | ICD-10-CM

## 2018-08-20 DIAGNOSIS — Z122 Encounter for screening for malignant neoplasm of respiratory organs: Secondary | ICD-10-CM

## 2018-08-20 NOTE — Telephone Encounter (Signed)
Patient has been notified that annual lung cancer screening low dose CT scan is due currently or will be in near future. Confirmed that patient is within the age range of 55-77, and asymptomatic, (no signs or symptoms of lung cancer). Patient denies illness that would prevent curative treatment for lung cancer if found. Verified smoking history, (former, quit 2015, 50 pack year). The shared decision making visit was done 04/19/16. Patient is agreeable for CT scan being scheduled.

## 2018-08-27 HISTORY — PX: OTHER SURGICAL HISTORY: SHX169

## 2018-09-03 ENCOUNTER — Ambulatory Visit
Admission: RE | Admit: 2018-09-03 | Discharge: 2018-09-03 | Disposition: A | Discharge status: Home / Self Care | Payer: Medicare Other | Source: Ambulatory Visit | Attending: Nurse Practitioner | Admitting: Nurse Practitioner

## 2018-09-03 DIAGNOSIS — Z87891 Personal history of nicotine dependence: Secondary | ICD-10-CM | POA: Insufficient documentation

## 2018-09-03 DIAGNOSIS — Z122 Encounter for screening for malignant neoplasm of respiratory organs: Secondary | ICD-10-CM | POA: Insufficient documentation

## 2018-09-04 ENCOUNTER — Other Ambulatory Visit: Payer: Self-pay | Admitting: Urgent Care

## 2018-09-04 ENCOUNTER — Telehealth: Payer: Self-pay | Admitting: *Deleted

## 2018-09-04 DIAGNOSIS — R918 Other nonspecific abnormal finding of lung field: Secondary | ICD-10-CM

## 2018-09-04 NOTE — Telephone Encounter (Signed)
Notified patient of LDCT lung cancer screening program results with recommendation for short term evaluation/follow up.  Also notified of incidental findings noted below and is encouraged to discuss further questions with PCP who will receive a copy of this not and/or the CT reports.  Patient verbalized understanding.  Referral has been made to pulmonary for further evaluation.   IMPRESSION: 1. New area of subpleural consolidation in the right upper lobe may be infectious or malignant in etiology. Lung-RADS 4B, suspicious. Consider short-term follow-up CT chest without contrast in 3-4 weeks in further initial evaluation. Consultation with pulmonary medicine or thoracic surgery should also be considered. These results will be called to the ordering clinician or representative by the Radiologist Assistant, and communication documented in the PACS or zVision Dashboard. 2. Aortic atherosclerosis (ICD10-170.0). Coronary artery calcification. 3.  Emphysema (ICD10-J43.9).

## 2018-09-10 ENCOUNTER — Encounter: Payer: Self-pay | Admitting: Pulmonary Disease

## 2018-09-10 ENCOUNTER — Ambulatory Visit (INDEPENDENT_AMBULATORY_CARE_PROVIDER_SITE_OTHER): Payer: Medicare Other | Admitting: Pulmonary Disease

## 2018-09-10 VITALS — BP 116/60 | HR 73 | Ht 71.5 in | Wt 203.0 lb

## 2018-09-10 DIAGNOSIS — J432 Centrilobular emphysema: Secondary | ICD-10-CM

## 2018-09-10 DIAGNOSIS — R918 Other nonspecific abnormal finding of lung field: Secondary | ICD-10-CM

## 2018-09-10 MED ORDER — AZITHROMYCIN 250 MG PO TABS: ORAL_TABLET | ORAL | 0 refills | Status: AC

## 2018-09-10 MED ORDER — PREDNISONE 20 MG PO TABS: 20.0000 mg | ORAL_TABLET | Freq: Every day | ORAL | 0 refills | Status: AC

## 2018-09-10 NOTE — Progress Notes (Signed)
Subjective:    Patient ID: ISAO SELTZER, male    DOB: 1945-11-21, 72 y.o.   MRN: [id number]  HPI Patient is a 72 year old former smoker who presents for evaluation of abnormal findings of a dose chest CT scan performed for lung cancer screening.  Patient is kindly referred by Honor Loh, NP.  The patient has been asymptomatic with regards to these findings.  He had a prior lung cancer screening CT a year previous that showed centrilobular as well as paraseptal emphysema and some pleural scarring changes.  On the most recent CT performed for lung cancer screening a new subpleural consolidation on the right upper lobe associated with bullous changes is noted.  It is difficult to discern whether this is a mass versus an filtrate as the area also shows some air bronchograms into it.  As noted the patient has not had any fevers, chills or sweats.  No change in his cough which is usually in the mornings and productive of whitish sputum.  He does note nasal congestion which has been a chronic issue.  No hemoptysis noted.  No epistaxis.  No other issues.  Patient's weight and appetite are stable.  I have reviewed the CT scan in question as well as reviewed the films with the patient and his wife.  They had the opportunity to ask questions and these were answered to their satisfaction.  As noted the patient has significant centrilobular and paraseptal emphysema.  The lesion in question is noted and is adjacent to a bulla.  Patient's past medical history, surgical history and social history were reviewed and are as noted.  Patient started smoking at age 31 and quit in 2015.  He smoked 1 pack of cigarettes per day.  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  He has not had any military experience.  Review of Systems: A 10 point review of systems was performed and was negative except for as noted above.     Objective:     Physical Exam Vitals signs and nursing note reviewed.  Constitutional:      General: He is not in acute distress.    Appearance: Normal appearance. He is normal weight. He is not ill-appearing.  HENT:     Head: Normocephalic and atraumatic.     Right Ear: External ear normal.     Left Ear: External ear normal.     Nose: Nose normal.     Mouth/Throat:     Mouth: Mucous membranes are moist.     Pharynx: Oropharynx is clear. No oropharyngeal exudate.  Eyes:     General: No scleral icterus.    Extraocular Movements: Extraocular movements intact.     Conjunctiva/sclera: Conjunctivae normal.  Neck:     Musculoskeletal: Neck supple.     Thyroid: No thyromegaly.     Vascular: No JVD.     Trachea: Trachea and phonation normal.  Cardiovascular:     Rate and Rhythm: Normal rate and regular rhythm.     Pulses: Normal pulses.     Heart sounds: Normal heart sounds.  Pulmonary:     Effort: Pulmonary effort is normal.     Breath sounds: Normal breath sounds.  Abdominal:     General: Abdomen is flat. Bowel sounds are normal.     Palpations: Abdomen is soft.  Musculoskeletal: Normal range of motion.     Right lower leg: No edema.  Left lower leg: No edema.  Lymphadenopathy:     Cervical: No cervical adenopathy.  Skin:    General: Skin is warm and dry.  Neurological:     General: No focal deficit present.     Mental Status: He is alert and oriented to person, place, and time.  Psychiatric:        Mood and Affect: Mood normal.        Behavior: Behavior normal.           Assessment & Plan:   1.  Abnormal findings on CT of the chest: Findings are concerning for potential malignancy however acute inflammation/infection cannot be excluded particularly given the air bronchograms coursing through it.  I recommend given a 5-day course of azithromycin and prednisone and repeating the CT chest in 3 weeks time.  This will be a super dimension CT in case biopsy is necessary.  The patient  is aware that biopsy could be risky given that he has bullous changes adjacent to this lesion.  He would be a very high risk for pneumothorax no matter what biopsy modality is chosen.  If the CT scan shows persistence biopsy will be necessary.  The patient understands that the best mode would then be navigational bronchoscopy.  Again this would not decrease the potential chance for pneumothorax.  Additionally a PET/CT could be performed prior to any biopsy to evaluate activity in this area.  2.  Emphysema centrilobular/paraseptal: Appears significant by CT.  We will obtain PFTs to evaluate lung function.  We will see the patient in follow-up after all tests have been performed.  Thank you for allowing me to participate in this patient's care.

## 2018-09-10 NOTE — Patient Instructions (Signed)
1.  We will give you azithromycin (Z-Pak) and prednisone for 5 days.  2.  We will see you in follow-up in 3 weeks time with a chest CT and breathing test to evaluate the issues above.

## 2018-09-17 ENCOUNTER — Ambulatory Visit: Payer: Self-pay | Admitting: Family Medicine

## 2018-09-17 DIAGNOSIS — L03119 Cellulitis of unspecified part of limb: Secondary | ICD-10-CM | POA: Insufficient documentation

## 2018-09-24 ENCOUNTER — Ambulatory Visit
Admission: RE | Admit: 2018-09-24 | Discharge: 2018-09-24 | Disposition: A | Discharge status: Home / Self Care | Payer: Medicare Other | Source: Ambulatory Visit | Attending: Pulmonary Disease | Admitting: Pulmonary Disease

## 2018-09-24 DIAGNOSIS — R918 Other nonspecific abnormal finding of lung field: Secondary | ICD-10-CM

## 2018-09-25 ENCOUNTER — Ambulatory Visit: Payer: Medicare Other | Attending: Pulmonary Disease

## 2018-09-25 ENCOUNTER — Encounter: Payer: Self-pay | Admitting: Urology

## 2018-09-25 ENCOUNTER — Ambulatory Visit (INDEPENDENT_AMBULATORY_CARE_PROVIDER_SITE_OTHER): Payer: Medicare Other | Admitting: Urology

## 2018-09-25 VITALS — BP 107/66 | HR 73 | Ht 71.5 in | Wt 206.4 lb

## 2018-09-25 DIAGNOSIS — J432 Centrilobular emphysema: Secondary | ICD-10-CM | POA: Insufficient documentation

## 2018-09-25 DIAGNOSIS — N401 Enlarged prostate with lower urinary tract symptoms: Secondary | ICD-10-CM

## 2018-09-25 DIAGNOSIS — N400 Enlarged prostate without lower urinary tract symptoms: Secondary | ICD-10-CM

## 2018-09-25 DIAGNOSIS — R350 Frequency of micturition: Secondary | ICD-10-CM

## 2018-09-25 LAB — BLADDER SCAN AMB NON-IMAGING: Scan Result: 13

## 2018-09-25 MED ORDER — DOXAZOSIN MESYLATE 4 MG PO TABS: 4.0000 mg | ORAL_TABLET | Freq: Every day | ORAL | 3 refills | Status: DC

## 2018-09-25 NOTE — Progress Notes (Signed)
   09/25/2018 12:32 PM   Dwayne Thompson 06-Aug-1946 [id number]  Reason for visit: Follow up BPH/OAB, enlarged prostate  HPI: I saw Dwayne Thompson back in urology clinic today to discuss his urinary symptoms and erectile dysfunction.  To briefly summarize, Dwayne Thompson is a 73 year old African-American male with alcoholic cirrhosis and no family history of prostate cancer.  Dwayne Thompson was previously found to have a incidental finding of a 90 cc prostate on a spinal MRI.  PSA is 4.7, with reassuring PSA density of 0.05.  We had discussed a trial of 20 mg Cialis for erectile dysfunction at her last visit in November, however Dwayne Thompson and his wife have not attempted to be sexually active since that time.  Regarding his urinary symptoms Dwayne Thompson has a large behavioral component with 4-5 alcoholic drinks per day that is liquor mixed with soda.  Dwayne Thompson has not cut back significantly on this.  Dwayne Thompson is also on doxazosin at baseline for urinary symptoms, and we added oxybutynin 10 mg XL at our last visit in November.  Dwayne Thompson does feel that this medication is significantly improved his urgency and frequency throughout the day.  IPSS score in clinic today is 4, with quality of life mostly dissatisfied.  PVR is 13 cc.   ROS: Please see flowsheet from today's date for complete review of systems.  Physical Exam: BP 107/66 (BP Location: Left Arm, Patient Position: Sitting, Cuff Size: Large)   Pulse 73   Ht 5' 11.5" (1.816 m)   Wt 206 lb 6.4 oz (93.6 kg)   BMI 28.39 kg/m    Constitutional:  Alert and oriented, No acute distress. Respiratory: Normal respiratory effort, no increased work of breathing. GI: Abdomen is soft, nontender, nondistended, no abdominal masses GU: No CVA tenderness Skin: No rashes, bruises or suspicious lesions. Neurologic: Grossly intact, no focal deficits, moving all 4 extremities. Psychiatric: Normal mood and affect  Assessment & Plan:   In summary, Dwayne Thompson is a 73 year old African-American male with primarily overactive  symptoms, and consistently low PVR.  Dwayne Thompson does consume quite a bit of alcohol and soda during the day, which likely exacerbates his symptoms.   We discussed that overactive bladder (OAB) is not a disease, but is a symptom complex that is generally not life-threatening.  Symptoms typically include urinary urgency, frequency, and urge incontinence.  There are numerous treatment options, however there are risks and benefits with both medical and surgical management.  First-line treatment is behavioral therapies including bladder training, pelvic floor muscle training, and fluid management.  Second line treatments include oral antimuscarinics(Ditropan er, Trospium) and beta-3 agonist (Mybetriq). There is typically a period of medication trial (4-8 weeks) to find the optimal therapy and dosing. If symptoms are bothersome despite the above management, third line options include intra-detrusor botox, peripheral tibial nerve stimulation (PTNS), and interstim (SNS). These are more invasive treatments with higher side effect profile, but may improve quality of life for patients with severe OAB symptoms.   Continue Ditropan XL daily Reinforce behavioral modifications including minimizing alcohol and soda Consider Urodynamics in future if not improved  Cialis 20 mg as needed  RTC 1 year for symptom check  Billey Co, MD  [facility] [address], [place], [place] [address] [phone]

## 2018-09-26 ENCOUNTER — Telehealth: Payer: Self-pay

## 2018-09-26 DIAGNOSIS — J432 Centrilobular emphysema: Secondary | ICD-10-CM

## 2018-09-26 NOTE — Telephone Encounter (Signed)
Spoke to patient, gave him results of CT. He is aware and will keep apt on 10/03/18.

## 2018-09-26 NOTE — Telephone Encounter (Signed)
LM for patient to call for CT results.   Overall improving, recommend f/u CT in 12 weeks.   Orders entered.

## 2018-09-26 NOTE — Telephone Encounter (Signed)
Patient returned phone call (CT resutlts) Patient contact # [phone]

## 2018-10-03 ENCOUNTER — Encounter: Payer: Self-pay | Admitting: Pulmonary Disease

## 2018-10-03 ENCOUNTER — Ambulatory Visit (INDEPENDENT_AMBULATORY_CARE_PROVIDER_SITE_OTHER): Payer: Medicare Other | Admitting: Pulmonary Disease

## 2018-10-03 VITALS — BP 132/78 | HR 64 | Ht 71.5 in | Wt 207.4 lb

## 2018-10-03 DIAGNOSIS — J310 Chronic rhinitis: Secondary | ICD-10-CM | POA: Diagnosis not present

## 2018-10-03 DIAGNOSIS — J432 Centrilobular emphysema: Secondary | ICD-10-CM | POA: Diagnosis not present

## 2018-10-03 DIAGNOSIS — R918 Other nonspecific abnormal finding of lung field: Secondary | ICD-10-CM

## 2018-10-03 MED ORDER — MONTELUKAST SODIUM 10 MG PO TABS: 10.0000 mg | ORAL_TABLET | Freq: Every day | ORAL | 3 refills | Status: DC

## 2018-10-03 NOTE — Patient Instructions (Signed)
1.  Your CT scan looks improved.  We will recheck in 2 months time.  2.  I have sent in a prescription for montelukast (Singulair) to see if this helps with your nasal symptoms and cough.

## 2018-10-03 NOTE — Progress Notes (Signed)
Subjective:    Patient ID: Dwayne Thompson, male    DOB: 10-Dec-1945, 73 y.o.   MRN: [id number]  HPI Patient is a 73 year old former smoker who presents for evaluation of abnormal findings of a dose chest CT scan performed for lung cancer screening. he patient has been asymptomatic with regards to these findings.  He had a prior lung cancer screening CT a year previous that showed centrilobular as well as paraseptal emphysema and some pleural scarring changes.  On the most recent CT performed for lung cancer screening a new subpleural consolidation on the right upper lobe associated with bullous changes is noted.  It was difficult to discern whether this is a mass versus an infiltrate as the area also showedsome air bronchograms into it.  As noted the patient has not had any fevers, chills or sweats.  No change in his cough which is usually in the mornings and productive of whitish sputum.  He does note nasal congestion which has been a chronic issue.  No hemoptysis noted.  No epistaxis.  No other issues. Patient's weight and appetite are stable. After our initial visit here, he was scheduled for repeat CT which was performed and shows that this area is decreasing in size likely related to inflammation/infection.  He was treated with a azithromycin and prednisone after that first visit.  He will need repeat CT between 6 to 12 weeks to ensure clearing.  The film was independently reviewed and also reviewed with the patient and his wife was present during the visit.  Patient had pulmonary function testing performed on 25 September 2018 this shows that he has preserved FEV1 at 2.98 L or 105% predicted.  Diffusion capacity was moderately reduced at 53%.  This is consistent with emphysema with preserved FEV1.  The patient is not experiencing any dyspnea.   Review of Systems  Constitutional: Negative.   HENT: Positive for congestion and postnasal drip.   Eyes: Negative.   Respiratory: Positive for cough.     Gastrointestinal: Negative.   Endocrine: Negative.   Musculoskeletal: Negative.   Skin: Negative.   Allergic/Immunologic: Negative.   Neurological: Negative.   All other systems reviewed and are negative.      Objective:   Physical Exam Vitals signs reviewed.  Constitutional:      Appearance: Normal appearance. He is normal weight. He is not ill-appearing.  HENT:     Head: Normocephalic and atraumatic.     Right Ear: External ear normal.     Left Ear: External ear normal.     Nose: Mucosal edema present.     Right Turbinates: Swollen.     Left Turbinates: Swollen.  Eyes:     General: No scleral icterus.    Conjunctiva/sclera: Conjunctivae normal.     Pupils: Pupils are equal, round, and reactive to light.  Neck:     Musculoskeletal: Neck supple.  Cardiovascular:     Rate and Rhythm: Normal rate and regular rhythm.     Heart sounds: Normal heart sounds.  Pulmonary:     Effort: Pulmonary effort is normal.     Breath sounds: Normal breath sounds.  Abdominal:     General: Abdomen is flat. There is no distension.  Musculoskeletal: Normal range of motion.     Right lower leg: No edema.     Left lower leg: No edema.  Lymphadenopathy:     Cervical: No cervical adenopathy.  Skin:    General: Skin is warm and dry.  Neurological:  General: No focal deficit present.     Mental Status: He is alert and oriented to person, place, and time.  Psychiatric:        Mood and Affect: Mood normal.        Behavior: Behavior normal.      Assessment & Plan:  1.  Abnormal findings on CT of the chest: Appears that the findings noted on the low-dose CT scan of the chest are improving.  The patient was given empiric treatment with a azithromycin and prednisone at that time and a CT scan of the chest was repeated.  Though there is partial clearing, the patient will need follow-up of this issue until resolution.  We will proceed with repeating CT scan in 8 weeks time.  We will see him in  follow-up at that time.   2.  Emphysema centrilobular/paraseptal: Appears significant by CT. he has preserved FEV1 but moderate diffusion capacity impairment.  This is consistent with emphysema with preserved FEV1.  3.  Perennial rhinitis: I suspect that his patient has significant perennial rhinitis with postnasal drip.  I suspect that his morning cough is more related to this issue.  We will give him a trial of montelukast (Singulair) 10 mg p.o. daily.  This was sent to his pharmacy of choice.

## 2018-10-10 ENCOUNTER — Ambulatory Visit (INDEPENDENT_AMBULATORY_CARE_PROVIDER_SITE_OTHER): Payer: Medicare Other | Admitting: Family Medicine

## 2018-10-10 VITALS — BP 118/68 | HR 67 | Temp 98.1°F | Resp 16 | Wt 205.0 lb

## 2018-10-10 DIAGNOSIS — D696 Thrombocytopenia, unspecified: Secondary | ICD-10-CM

## 2018-10-10 DIAGNOSIS — Z87891 Personal history of nicotine dependence: Secondary | ICD-10-CM

## 2018-10-10 DIAGNOSIS — K219 Gastro-esophageal reflux disease without esophagitis: Secondary | ICD-10-CM

## 2018-10-10 DIAGNOSIS — N138 Other obstructive and reflux uropathy: Secondary | ICD-10-CM

## 2018-10-10 DIAGNOSIS — M47896 Other spondylosis, lumbar region: Secondary | ICD-10-CM

## 2018-10-10 DIAGNOSIS — N3281 Overactive bladder: Secondary | ICD-10-CM

## 2018-10-10 DIAGNOSIS — N529 Male erectile dysfunction, unspecified: Secondary | ICD-10-CM | POA: Diagnosis not present

## 2018-10-10 DIAGNOSIS — N401 Enlarged prostate with lower urinary tract symptoms: Secondary | ICD-10-CM

## 2018-10-10 NOTE — Progress Notes (Signed)
Dwayne Thompson  MRN: [id number] DOB: [birth year]  Subjective:  HPI  The patient is a 73 year old male who presents today for 3 month follow up.  He was last seen on 07/12/18.  At that time he had complaints of increased urinary frequency with decreased urine flow.  He also complained of significant concern over severe ED. At the time of the last visit the patient was given referral to urology for both issues as well as having a PSA done. Patient states that they treated him with Ditropan XL for over active bladder and Cialis for Erectile dysfunction.  Patient state that he feels the Ditropan is helping with his OAB but does not believe he has had any improvement with the ED.  Patient Active Problem List   Diagnosis Date Noted  . CLE (columnar lined esophagus)   . Special screening for malignant neoplasms, colon   . Benign neoplasm of ascending colon   . Polyp of sigmoid colon   . Diverticulosis of large intestine without diverticulitis   . Iron deficiency anemia due to chronic blood loss 02/18/2018  . Folate deficiency 02/14/2018  . Anemia due to vitamin B12 deficiency 01/10/2018  . Anemia due to folic acid deficiency 85/46/2703  . 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 Helicobacter pylori gastritis (chronic gastritis) 01/10/2018  . Thrombocytopenia ([place]) 01/10/2018  . Internal hemorrhoids   . Acute gastrointestinal hemorrhage   . Stomach irritation   . Duodenal nodule   . Melena 01/01/2018  . Neck pain on right side 03/23/2016  . History of tobacco abuse 03/23/2016  . Cervical lymphadenitis 03/23/2016  . 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 Allergic rhinitis 03/28/2015  . 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 Failure of erection 03/28/2015  . Acid reflux 03/28/2015  . Arthritis, degenerative 03/28/2015  .  Change in blood platelet count 03/28/2015  . Temporary cerebral vascular dysfunction 03/28/2015  . Personal history of tobacco use, presenting hazards to health 03/28/2015    Past Medical History:  Diagnosis Date  . Anemia   . Back pain   . BPH (benign prostatic hyperplasia)   . ED (erectile dysfunction)   . Hyperlipidemia     Social History   Socioeconomic History  . Marital status: Married    Spouse name: [name]  . Number of children: 3  . Years of education: 76  . Highest education level: 12th grade  Occupational History  . Occupation: retired  Scientific laboratory technician  . Financial resource strain: Not hard at all  . Food insecurity:    Worry: Never true    Inability: Never true  . Transportation needs:    Medical: No    Non-medical: No  Tobacco Use  . Smoking status: Former Smoker    Packs/day: 1.00    Years: 50.00    Pack years: 50.00    Types: Cigarettes    Last attempt to quit: 10/20/2013    Years since quitting: 4.9  . Smokeless tobacco: Never Used  Substance and Sexual Activity  . Alcohol use: Yes    Alcohol/week: 21.0 - 28.0 standard drinks    Types: 21 - 28 Shots of liquor per week    Comment: 3-4 gin and sodas a day  . Drug use: No  . Sexual activity: Not Currently  Lifestyle  . Physical activity:    Days per week:  Not on file    Minutes per session: Not on file  . Stress: Not at all  Relationships  . Social connections:    Talks on phone: Not on file    Gets together: Not on file    Attends religious service: Not on file    Active member of club or organization: Not on file    Attends meetings of clubs or organizations: Not on file    Relationship status: Not on file  . Intimate partner violence:    Fear of current or ex partner: Not on file    Emotionally abused: Not on file    Physically abused: Not on file    Forced sexual activity: Not on file  Other Topics Concern  . Not on file  Social History Narrative  . Not on file    Outpatient  Encounter Medications as of 10/10/2018  Medication Sig Note  . acetaminophen (TYLENOL) 325 MG tablet Take 2 tablets (650 mg total) by mouth 3 (three) times daily.   Marland Kitchen aspirin EC 81 MG EC tablet Take 1 tablet (81 mg total) by mouth daily. 05/16/2018: Patient now taking.  . cetirizine (ZYRTEC) 10 MG tablet Take 1 tablet (10 mg total) by mouth daily.   . Cholecalciferol 1000 UNITS tablet Take 1,000 Units by mouth daily. Reported on 03/03/2016   . doxazosin (CARDURA) 4 MG tablet Take 1 tablet (4 mg total) by mouth daily.   . folic acid (FOLVITE) 1 MG tablet Take 1 tablet (1 mg total) by mouth daily.   Marland Kitchen HYDROcodone-acetaminophen (NORCO/VICODIN) 5-325 MG tablet hydrocodone 5 mg-acetaminophen 325 mg tablet  Take 1-2 every 6-8 hours if needed for pain   . lisinopril (PRINIVIL,ZESTRIL) 10 MG tablet Take 1 tablet (10 mg total) by mouth daily.   . montelukast (SINGULAIR) 10 MG tablet Take 1 tablet (10 mg total) by mouth daily.   Marland Kitchen oxybutynin (DITROPAN-XL) 10 MG 24 hr tablet Take 1 tablet (10 mg total) by mouth daily.   . pantoprazole (PROTONIX) 20 MG tablet TAKE (1) TABLET TWICE A DAY.   Marland Kitchen potassium chloride (K-DUR,KLOR-CON) 10 MEQ tablet Take 1 tablet (10 mEq total) by mouth 2 (two) times daily.   . rosuvastatin (CRESTOR) 10 MG tablet Take 1 tablet (10 mg total) by mouth daily.   . tadalafil (ADCIRCA/CIALIS) 20 MG tablet Take 1 tablet (20 mg total) by mouth daily as needed for erectile dysfunction.   . vitamin B-12 1000 MCG tablet Take 1 tablet (1,000 mcg total) by mouth daily.   . valACYclovir (VALTREX) 1000 MG tablet Take 1 tablet (1,000 mg total) by mouth 3 (three) times daily. (Patient not taking: Reported on 10/10/2018)    No facility-administered encounter medications on file as of 10/10/2018.     Allergies  Allergen Reactions  . Tamsulosin Hives  . Other Rash  . Tamsulosin Hcl Hives  . Sulfa Antibiotics Rash    Review of Systems  Constitutional: Negative for fever and malaise/fatigue.    HENT: Negative.   Eyes: Negative.   Respiratory: Negative for cough, shortness of breath and wheezing.   Cardiovascular: Negative for chest pain, palpitations, orthopnea, claudication and leg swelling.  Gastrointestinal: Negative.   Musculoskeletal: Positive for joint pain.  Skin: Negative.   Endo/Heme/Allergies: Negative.   Psychiatric/Behavioral: Negative.     Objective:  BP 118/68 (BP Location: Right Arm, Patient Position: Sitting, Cuff Size: Normal)   Pulse 67   Temp 98.1 F (36.7 C) (Oral)   Resp 16   Wt  205 lb (93 kg)   SpO2 99%   BMI 28.19 kg/m   Physical Exam  Constitutional: He is oriented to person, place, and time and well-developed, well-nourished, and in no distress.  HENT:  Head: Normocephalic and atraumatic.  Right Ear: External ear normal.  Left Ear: External ear normal.  Nose: Nose normal.  Mouth/Throat: Oropharynx is clear and moist.  Eyes: Conjunctivae are normal. No scleral icterus.  Cardiovascular: Normal rate, regular rhythm and normal heart sounds.  Pulmonary/Chest: Effort normal and breath sounds normal.  Lymphadenopathy:    He has no cervical adenopathy.  Neurological: He is alert and oriented to person, place, and time. Gait normal. GCS score is 15.  Skin: Skin is warm and dry.  Psychiatric: Mood, memory, affect and judgment normal.    Assessment and Plan :   1. Gastroesophageal reflux disease without esophagitis   2. Other osteoarthritis of spine, lumbar region   3. Erectile dysfunction of organic origin   4. BPH with obstruction/lower urinary tract symptoms   5. Thrombocytopenia ([place])   6. History of tobacco abuse  7.OAB  I have done the exam and reviewed the chart and it is accurate to the best of my knowledge. Development worker, community has been used and  any errors in dictation or transcription are unintentional. [name] M.D. [facility]

## 2018-10-16 ENCOUNTER — Other Ambulatory Visit: Payer: Self-pay | Admitting: Family Medicine

## 2018-10-16 NOTE — Telephone Encounter (Signed)
Pharmacy requesting refills. Thanks!  

## 2018-11-13 ENCOUNTER — Inpatient Hospital Stay: Payer: Medicare Other | Attending: Hematology and Oncology

## 2018-11-13 DIAGNOSIS — E538 Deficiency of other specified B group vitamins: Secondary | ICD-10-CM | POA: Insufficient documentation

## 2018-11-13 DIAGNOSIS — D696 Thrombocytopenia, unspecified: Secondary | ICD-10-CM | POA: Diagnosis not present

## 2018-11-13 DIAGNOSIS — D500 Iron deficiency anemia secondary to blood loss (chronic): Secondary | ICD-10-CM | POA: Diagnosis not present

## 2018-11-13 DIAGNOSIS — K922 Gastrointestinal hemorrhage, unspecified: Secondary | ICD-10-CM | POA: Diagnosis not present

## 2018-11-13 LAB — CBC WITH DIFFERENTIAL/PLATELET
Abs Immature Granulocytes: 0.04 10*3/uL (ref 0.00–0.07)
Basophils Absolute: 0 10*3/uL (ref 0.0–0.1)
Basophils Relative: 1 %
Eosinophils Absolute: 0.2 10*3/uL (ref 0.0–0.5)
Eosinophils Relative: 3 %
HCT: 40.7 % (ref 39.0–52.0)
Hemoglobin: 14.5 g/dL (ref 13.0–17.0)
Immature Granulocytes: 1 %
Lymphocytes Relative: 29 %
Lymphs Abs: 1.7 10*3/uL (ref 0.7–4.0)
MCH: 30.9 pg (ref 26.0–34.0)
MCHC: 35.6 g/dL (ref 30.0–36.0)
MCV: 86.8 fL (ref 80.0–100.0)
Monocytes Absolute: 0.5 10*3/uL (ref 0.1–1.0)
Monocytes Relative: 8 %
Neutro Abs: 3.3 10*3/uL (ref 1.7–7.7)
Neutrophils Relative %: 58 %
Platelets: 93 10*3/uL — ABNORMAL LOW (ref 150–400)
RBC: 4.69 MIL/uL (ref 4.22–5.81)
RDW: 14.4 % (ref 11.5–15.5)
WBC: 5.6 10*3/uL (ref 4.0–10.5)
nRBC: 0 % (ref 0.0–0.2)

## 2018-11-13 LAB — FERRITIN: Ferritin: 107 ng/mL (ref 24–336)

## 2018-11-14 ENCOUNTER — Inpatient Hospital Stay: Payer: Medicare Other

## 2018-11-14 ENCOUNTER — Other Ambulatory Visit: Payer: Self-pay

## 2018-11-14 ENCOUNTER — Telehealth: Payer: Self-pay

## 2018-11-14 ENCOUNTER — Other Ambulatory Visit: Payer: Self-pay | Admitting: Hematology and Oncology

## 2018-11-14 DIAGNOSIS — D696 Thrombocytopenia, unspecified: Secondary | ICD-10-CM

## 2018-11-14 NOTE — Telephone Encounter (Signed)
-----   Message from Lequita Asal, MD sent at 11/14/2018  3:57 PM EST ----- Regarding: Please call patient  Let him know about his current platelet count.  Notify us if any excess bruising or bleeding.  Check CBC in 1 month.  M ----- Message ----- From: Buel Ream, [facility] [place] Sent: 11/13/2018  11:45 AM EST To: Lequita Asal, MD

## 2018-11-14 NOTE — Telephone Encounter (Signed)
Informed patient and wife of low platelet count. Educated on precautions and to make [place] aware of any excess bruising or bleeding. Patient made aware labs need to be rechecked in 1 month. Patient and wife states understanding and denies any concerns at this time.

## 2018-11-15 ENCOUNTER — Other Ambulatory Visit: Payer: Self-pay | Admitting: Hematology and Oncology

## 2018-11-15 DIAGNOSIS — D529 Folate deficiency anemia, unspecified: Secondary | ICD-10-CM

## 2018-11-19 ENCOUNTER — Other Ambulatory Visit: Payer: Self-pay | Admitting: Family Medicine

## 2018-12-04 ENCOUNTER — Ambulatory Visit
Admission: RE | Admit: 2018-12-04 | Discharge: 2018-12-04 | Disposition: A | Discharge status: Home / Self Care | Payer: Medicare Other | Source: Ambulatory Visit | Attending: Pulmonary Disease | Admitting: Pulmonary Disease

## 2018-12-04 ENCOUNTER — Other Ambulatory Visit: Payer: Self-pay

## 2018-12-04 DIAGNOSIS — J432 Centrilobular emphysema: Secondary | ICD-10-CM | POA: Diagnosis present

## 2018-12-06 DIAGNOSIS — M6281 Muscle weakness (generalized): Secondary | ICD-10-CM | POA: Insufficient documentation

## 2018-12-06 DIAGNOSIS — R262 Difficulty in walking, not elsewhere classified: Secondary | ICD-10-CM | POA: Insufficient documentation

## 2018-12-07 ENCOUNTER — Telehealth: Payer: Self-pay

## 2018-12-07 DIAGNOSIS — J432 Centrilobular emphysema: Secondary | ICD-10-CM

## 2018-12-07 NOTE — Telephone Encounter (Signed)
Spoke to patient and his wife. Let them know that the lesion is resolving and we can cx apt for Monday. Moved apt to 02/19/19 and will enter order for CT scan prior. Patient aware. Nothing further needed at this time.

## 2018-12-07 NOTE — Telephone Encounter (Signed)
-----   Message from Tyler Pita, MD sent at 12/07/2018 11:12 AM EDT ----- Lesion noted is smaller. Likely resolving pneumonia. Will see in 6-8 wks with F/U CT to insure resolution.

## 2018-12-10 ENCOUNTER — Ambulatory Visit: Payer: Medicare Other | Admitting: Pulmonary Disease

## 2018-12-11 ENCOUNTER — Ambulatory Visit: Payer: Medicare Other | Admitting: Gastroenterology

## 2018-12-12 ENCOUNTER — Other Ambulatory Visit: Payer: Self-pay

## 2018-12-12 ENCOUNTER — Ambulatory Visit: Payer: Medicare Other | Admitting: Gastroenterology

## 2018-12-13 ENCOUNTER — Other Ambulatory Visit: Payer: Self-pay

## 2018-12-13 ENCOUNTER — Inpatient Hospital Stay: Payer: Medicare Other | Attending: Hematology and Oncology

## 2018-12-13 DIAGNOSIS — D696 Thrombocytopenia, unspecified: Secondary | ICD-10-CM | POA: Insufficient documentation

## 2018-12-13 DIAGNOSIS — K922 Gastrointestinal hemorrhage, unspecified: Secondary | ICD-10-CM | POA: Insufficient documentation

## 2018-12-13 DIAGNOSIS — E538 Deficiency of other specified B group vitamins: Secondary | ICD-10-CM | POA: Insufficient documentation

## 2018-12-13 DIAGNOSIS — D500 Iron deficiency anemia secondary to blood loss (chronic): Secondary | ICD-10-CM | POA: Insufficient documentation

## 2018-12-13 LAB — CBC WITH DIFFERENTIAL/PLATELET
Abs Immature Granulocytes: 0.04 10*3/uL (ref 0.00–0.07)
Basophils Absolute: 0 10*3/uL (ref 0.0–0.1)
Basophils Relative: 1 %
Eosinophils Absolute: 0.2 10*3/uL (ref 0.0–0.5)
Eosinophils Relative: 3 %
HCT: 41.3 % (ref 39.0–52.0)
Hemoglobin: 14.4 g/dL (ref 13.0–17.0)
Immature Granulocytes: 1 %
Lymphocytes Relative: 27 %
Lymphs Abs: 1.7 10*3/uL (ref 0.7–4.0)
MCH: 30.6 pg (ref 26.0–34.0)
MCHC: 34.9 g/dL (ref 30.0–36.0)
MCV: 87.7 fL (ref 80.0–100.0)
Monocytes Absolute: 0.6 10*3/uL (ref 0.1–1.0)
Monocytes Relative: 9 %
Neutro Abs: 3.7 10*3/uL (ref 1.7–7.7)
Neutrophils Relative %: 59 %
Platelets: 109 10*3/uL — ABNORMAL LOW (ref 150–400)
RBC: 4.71 MIL/uL (ref 4.22–5.81)
RDW: 14 % (ref 11.5–15.5)
WBC: 6.2 10*3/uL (ref 4.0–10.5)
nRBC: 0 % (ref 0.0–0.2)

## 2018-12-15 ENCOUNTER — Other Ambulatory Visit: Payer: Self-pay | Admitting: Urgent Care

## 2018-12-15 DIAGNOSIS — D529 Folate deficiency anemia, unspecified: Secondary | ICD-10-CM

## 2018-12-17 ENCOUNTER — Other Ambulatory Visit: Payer: Self-pay | Admitting: Hematology and Oncology

## 2018-12-19 ENCOUNTER — Telehealth: Payer: Self-pay | Admitting: *Deleted

## 2018-12-19 MED ORDER — PANTOPRAZOLE SODIUM 20 MG PO TBEC: 20.0000 mg | DELAYED_RELEASE_TABLET | Freq: Two times a day (BID) | ORAL | 0 refills | Status: DC

## 2018-12-19 MED ORDER — POTASSIUM CHLORIDE CRYS ER 10 MEQ PO TBCR: 10.0000 meq | EXTENDED_RELEASE_TABLET | Freq: Two times a day (BID) | ORAL | 0 refills | Status: DC

## 2018-12-19 NOTE — Telephone Encounter (Signed)
Patient is waiting for his refills to come in from mail order, which will take up to 2 weeks. Patient is requesting a 2 week supply of potassium 10 meq and pantoprazole 20 mg be sent to [facility], to get by until mail order comes in. Please advise?

## 2018-12-19 NOTE — Telephone Encounter (Signed)
2 weeks of meds was sent into the pharmacy. Just FYI.

## 2018-12-20 ENCOUNTER — Other Ambulatory Visit: Payer: Self-pay | Admitting: Family Medicine

## 2018-12-20 DIAGNOSIS — I700 Atherosclerosis of aorta: Secondary | ICD-10-CM

## 2018-12-20 DIAGNOSIS — I10 Essential (primary) hypertension: Secondary | ICD-10-CM

## 2018-12-20 NOTE — Telephone Encounter (Signed)
[facility] faxed refill request for the following medications:  1. rosuvastatin (CRESTOR) 10 MG tablet 2. lisinopril (PRINIVIL,ZESTRIL) 10 MG tablet 3. potassium chloride (K-DUR,KLOR-CON) 10 MEQ tablet  4. pantoprazole (PROTONIX) 20 MG tablet 5. cetirizine (ZYRTEC) 10 MG tablet  90 day supply  Please advise. Thanks TNP

## 2018-12-21 MED ORDER — PANTOPRAZOLE SODIUM 20 MG PO TBEC: 20.0000 mg | DELAYED_RELEASE_TABLET | Freq: Two times a day (BID) | ORAL | 3 refills | Status: DC

## 2018-12-21 MED ORDER — POTASSIUM CHLORIDE CRYS ER 10 MEQ PO TBCR: 10.0000 meq | EXTENDED_RELEASE_TABLET | Freq: Two times a day (BID) | ORAL | 3 refills | Status: DC

## 2018-12-21 MED ORDER — LISINOPRIL 10 MG PO TABS: 10.0000 mg | ORAL_TABLET | Freq: Every day | ORAL | 3 refills | Status: DC

## 2018-12-21 MED ORDER — CETIRIZINE HCL 10 MG PO TABS: 10.0000 mg | ORAL_TABLET | Freq: Every day | ORAL | 4 refills | Status: DC

## 2018-12-21 MED ORDER — ROSUVASTATIN CALCIUM 10 MG PO TABS: 10.0000 mg | ORAL_TABLET | Freq: Every day | ORAL | 3 refills | Status: DC

## 2018-12-21 NOTE — Addendum Note (Signed)
Addended by: Edd Arbour B on: 12/21/2018 11:03 AM   Modules accepted: Orders

## 2018-12-21 NOTE — Telephone Encounter (Signed)
[place] for 1 year on all.

## 2018-12-21 NOTE — Telephone Encounter (Signed)
I sent In all rx's except potassium and Protonix.  They had only 14 days on them.  Do you still want pt to continue taking these 2 rx's on a regular basis?  If so I can send them [facility]  dbs

## 2018-12-27 ENCOUNTER — Other Ambulatory Visit: Payer: Self-pay | Admitting: Pulmonary Disease

## 2018-12-27 MED ORDER — MONTELUKAST SODIUM 10 MG PO TABS: 10.0000 mg | ORAL_TABLET | Freq: Every day | ORAL | 0 refills | Status: DC

## 2018-12-27 NOTE — Telephone Encounter (Signed)
[facility] 10mg .  Rx for singulair 10mg  has been sent to preferred pharmacy, as pt was instructed to continue this medication at last OV.  Pt has pending ov for 02/18/09. Nothing further is needed.

## 2019-01-17 ENCOUNTER — Encounter: Payer: Self-pay | Admitting: Family Medicine

## 2019-01-17 ENCOUNTER — Other Ambulatory Visit: Payer: Self-pay

## 2019-01-17 ENCOUNTER — Ambulatory Visit (INDEPENDENT_AMBULATORY_CARE_PROVIDER_SITE_OTHER): Payer: Medicare Other | Admitting: Family Medicine

## 2019-01-17 VITALS — BP 120/73 | HR 58 | Temp 98.3°F | Resp 16 | Wt 214.0 lb

## 2019-01-17 DIAGNOSIS — D500 Iron deficiency anemia secondary to blood loss (chronic): Secondary | ICD-10-CM

## 2019-01-17 DIAGNOSIS — M47896 Other spondylosis, lumbar region: Secondary | ICD-10-CM | POA: Diagnosis not present

## 2019-01-17 DIAGNOSIS — S161XXA Strain of muscle, fascia and tendon at neck level, initial encounter: Secondary | ICD-10-CM | POA: Diagnosis not present

## 2019-01-17 MED ORDER — PREDNISONE 10 MG (21) PO TBPK: ORAL_TABLET | ORAL | 0 refills | Status: DC

## 2019-01-17 MED ORDER — CYCLOBENZAPRINE HCL 10 MG PO TABS: ORAL_TABLET | ORAL | 1 refills | Status: DC

## 2019-01-17 NOTE — Progress Notes (Signed)
Patient: Dwayne Thompson Male    DOB: 1946/03/05   73 y.o.   MRN: [id number] Visit Date: 01/17/2019  Today's Provider: [name], MD   Chief Complaint  Patient presents with  . Neck Pain  . Headache   Subjective:     Shoulder Pain   The pain is present in the right shoulder. This is a new problem. The current episode started more than 1 year ago. The problem occurs constantly. The quality of the pain is described as aching. The pain is at a severity of 10/10. The pain is severe. Pertinent negatives include no fever, inability to bear weight, itching, joint locking, joint swelling, limited range of motion, numbness, stiffness or tingling.     Patient has had right shoulder pain for over 1 year. Patient states about 6 months ago pain began radiating from shoulder to neck. Patient states at times pain is severe and causes him to have headaches. Patient has been taking Tylenol Arthritis with only mild relief.    Allergies  Allergen Reactions  . Tamsulosin Hives  . Other Rash  . Tamsulosin Hcl Hives  . Sulfa Antibiotics Rash     Current Outpatient Medications:  .  acetaminophen (TYLENOL) 325 MG tablet, Take 2 tablets (650 mg total) by mouth 3 (three) times daily., Disp: 180 tablet, Rfl: 0 .  aspirin EC 81 MG EC tablet, Take 1 tablet (81 mg total) by mouth daily., Disp: 180 tablet, Rfl: 0 .  cetirizine (ZYRTEC) 10 MG tablet, Take 1 tablet (10 mg total) by mouth daily., Disp: 90 tablet, Rfl: 4 .  Cholecalciferol 1000 UNITS tablet, Take 1,000 Units by mouth daily. Reported on 03/03/2016, Disp: , Rfl:  .  doxazosin (CARDURA) 4 MG tablet, Take 1 tablet (4 mg total) by mouth daily., Disp: 90 tablet, Rfl: 3 .  folic acid (FOLVITE) 1 MG tablet, Take 1 tablet (1 mg total) by mouth daily., Disp: 30 tablet, Rfl: 0 .  HYDROcodone-acetaminophen (NORCO/VICODIN) 5-325 MG tablet, hydrocodone 5 mg-acetaminophen 325 mg tablet  Take 1-2 every 6-8 hours if needed for pain, Disp: , Rfl:  .   lisinopril (PRINIVIL,ZESTRIL) 10 MG tablet, Take 1 tablet (10 mg total) by mouth daily., Disp: 90 tablet, Rfl: 3 .  montelukast (SINGULAIR) 10 MG tablet, Take 1 tablet (10 mg total) by mouth daily., Disp: 90 tablet, Rfl: 0 .  oxybutynin (DITROPAN-XL) 10 MG 24 hr tablet, Take 1 tablet (10 mg total) by mouth daily., Disp: 30 tablet, Rfl: 11 .  pantoprazole (PROTONIX) 20 MG tablet, Take 1 tablet (20 mg total) by mouth 2 (two) times daily., Disp: 180 tablet, Rfl: 3 .  potassium chloride (K-DUR,KLOR-CON) 10 MEQ tablet, Take 1 tablet (10 mEq total) by mouth 2 (two) times daily., Disp: 180 tablet, Rfl: 3 .  rosuvastatin (CRESTOR) 10 MG tablet, Take 1 tablet (10 mg total) by mouth daily., Disp: 90 tablet, Rfl: 3 .  tadalafil (ADCIRCA/CIALIS) 20 MG tablet, Take 1 tablet (20 mg total) by mouth daily as needed for erectile dysfunction., Disp: 15 tablet, Rfl: 11 .  valACYclovir (VALTREX) 1000 MG tablet, Take 1 tablet (1,000 mg total) by mouth 3 (three) times daily., Disp: 21 tablet, Rfl: 0 .  vitamin B-12 1000 MCG tablet, Take 1 tablet (1,000 mcg total) by mouth daily., Disp: 180 tablet, Rfl: 1  Review of Systems  Constitutional: Negative for appetite change, chills and fever.  Eyes: Negative.   Respiratory: Negative for chest tightness, shortness of breath  and wheezing.   Cardiovascular: Negative for chest pain and palpitations.  Gastrointestinal: Negative for abdominal pain, nausea and vomiting.  Musculoskeletal: Positive for myalgias. Negative for stiffness.  Skin: Negative for itching.  Allergic/Immunologic: Negative.   Neurological: Negative for tingling and numbness.  Psychiatric/Behavioral: Negative.     Social History   Tobacco Use  . Smoking status: Former Smoker    Packs/day: 1.00    Years: 50.00    Pack years: 50.00    Types: Cigarettes    Last attempt to quit: 10/20/2013    Years since quitting: 5.2  . Smokeless tobacco: Never Used  Substance Use Topics  . Alcohol use: Yes     Alcohol/week: 21.0 - 28.0 standard drinks    Types: 21 - 28 Shots of liquor per week    Comment: 3-4 gin and sodas a day      Objective:   BP 120/73 (BP Location: Right Arm, Patient Position: Sitting, Cuff Size: Large)   Pulse (!) 58   Temp 98.3 F (36.8 C) (Oral)   Resp 16   Wt 214 lb (97.1 kg)   SpO2 98%   BMI 29.43 kg/m  Vitals:   01/17/19 0948  BP: 120/73  Pulse: (!) 58  Resp: 16  Temp: 98.3 F (36.8 C)  TempSrc: Oral  SpO2: 98%  Weight: 214 lb (97.1 kg)     Physical Exam Vitals signs reviewed.  Constitutional:      Appearance: He is well-developed.  HENT:     Head: Normocephalic and atraumatic.  Cardiovascular:     Rate and Rhythm: Normal rate and regular rhythm.  Pulmonary:     Breath sounds: Normal breath sounds.  Abdominal:     Palpations: Abdomen is soft.  Skin:    General: Skin is warm and dry.  Neurological:     Mental Status: He is alert and oriented to person, place, and time.  Psychiatric:        Mood and Affect: Mood normal.        Speech: Speech normal.        Behavior: Behavior normal.         Assessment & Plan    1. Strain of neck muscle, initial encounter Further w/u if not better. - predniSONE (STERAPRED UNI-PAK 21 TAB) 10 MG (21) TBPK tablet; Use as directed for 6 days  Dispense: 21 tablet; Refill: 0 - cyclobenzaprine (FLEXERIL) 10 MG tablet; Take one tablet at night and then take 1 tablet twice a day PRN  Dispense: 90 tablet; Refill: 1  2. Other osteoarthritis of spine, lumbar region   3. Iron deficiency anemia due to chronic blood loss F/u CBC for AVM.  I,April Miller,acting as a scribe for [name], MD.,have documented all relevant documentation on the behalf of [name], MD,as directed by  [name], MD while in the presence of [name], MD.   [name], MD  [facility]

## 2019-01-23 ENCOUNTER — Telehealth: Payer: Self-pay

## 2019-01-23 ENCOUNTER — Ambulatory Visit (INDEPENDENT_AMBULATORY_CARE_PROVIDER_SITE_OTHER): Payer: Medicare Other | Admitting: Gastroenterology

## 2019-01-23 DIAGNOSIS — K7030 Alcoholic cirrhosis of liver without ascites: Secondary | ICD-10-CM | POA: Diagnosis not present

## 2019-01-23 NOTE — Progress Notes (Signed)
Dwayne Antigua, MD [address]  [place], [place] [address]  Main: [phone]  Fax: [phone]   Primary Care Physician: Dwayne Thompson., MD  Virtual Visit via Video Note  I connected with patient on 01/23/19 at  9:30 AM EDT by video (using [email or web address]) and verified that I am speaking with the correct person using two identifiers.   I discussed the limitations, risks, security and privacy concerns of performing an evaluation and management service by video and the availability of in person appointments. I also discussed with the patient that there may be a patient responsible charge related to this service. The patient expressed understanding and agreed to proceed.  Location of Patient: Home Location of Provider: Home Persons involved: Patient, his wife, and provider only (Nursing staff checked in patient via phone but were not physically involved in the video interaction - see their notes)   History of Present Illness: Chief Complaint  Patient presents with  . alcoholic cirrhosis    HPI: Dwayne Thompson is a 73 y.o. male with history of alcoholic cirrhosis is being seen for follow-up.  Patient continues to drink 2 shots of vodka daily.  He has been previously informed the risks of ongoing alcohol use, leading to decompensation of cirrhosis and increased comorbidities and mortality and he has verbalized understanding.  The patient denies abdominal or flank pain, anorexia, nausea or vomiting, dysphagia, change in bowel habits or black or bloody stools or weight loss.  Denies any confusion or recent episodes of bleeding.  Previous history: His recent EGD did not reveal any varices.  No episodes of confusion.  No recent GI bleeding.  The diagnosis of cirrhosis began with him noted to be thrombocytopenic on blood work, which led to an ultrasound that showed cirrhosis.  His liver enzymes and INR are normal.  Meld of 7 in September 2019.  Patient was seen for  hospitalization consult on April 15 due to melena. He also had history of ibuprofen use prior to the hospitalization. He underwent extensive work-up. EGD on April 16 showed erythematous mucosa in the antrum, duodenum. Nodular erythematous mucosa in the duodenum was biopsied and clipped. No active bleeding was seen during the EGD. Colonoscopy on April 17 showed melena but no red blood. No actively bleeding lesions. This was consistent with a more proximal GI source of bleeding. He underwent push enteroscopy that showed same findings as previous EGD, and no actively bleeding lesions. Normal duodenum. Capsule endoscopy was performed and did not identify specific source of bleeding. However, small nonspecific red blood strand was seen in the small bowel, which could represent an oozing AVM (however, no AVMs were identified on the pill camera itself).  Gastric biopsies showed H. pylori, and hehas completed H. pylori triple therapy.  Patient underwent repeat colonoscopy and EGD on April 05, 2018 It reported 2 tongues of salmon-colored mucosa, maximal longitudinal extent 1 cm. Biopsy. Gastric erythema. Biopsied. mild erythema duodenal bulb. Biopsied.  Colonoscopy with normal terminal ileum. 4 less than 1 cm polyps removed from the ascending colon and sigmoid colon, nonbleeding internal hemorrhoids, diverticulosis.  Biopsies did not show any evidence of H. pylori. Antral atrophy was reported. GE junction biopsies negative for goblet cells, dysplasia, and reported reflux esophagitis. Colon polypsshowed tubular adenoma.   Current Outpatient Medications  Medication Sig Dispense Refill  . acetaminophen (TYLENOL) 325 MG tablet Take 2 tablets (650 mg total) by mouth 3 (three) times daily. 180 tablet 0  . aspirin  EC 81 MG EC tablet Take 1 tablet (81 mg total) by mouth daily. 180 tablet 0  . cetirizine (ZYRTEC) 10 MG tablet Take 1 tablet (10 mg total) by mouth daily. 90 tablet 4  .  Cholecalciferol 1000 UNITS tablet Take 1,000 Units by mouth daily. Reported on 03/03/2016    . cyclobenzaprine (FLEXERIL) 10 MG tablet Take one tablet at night and then take 1 tablet twice a day PRN 90 tablet 1  . doxazosin (CARDURA) 4 MG tablet Take 1 tablet (4 mg total) by mouth daily. 90 tablet 3  . folic acid (FOLVITE) 1 MG tablet Take 1 tablet (1 mg total) by mouth daily. 30 tablet 0  . HYDROcodone-acetaminophen (NORCO/VICODIN) 5-325 MG tablet hydrocodone 5 mg-acetaminophen 325 mg tablet  Take 1-2 every 6-8 hours if needed for pain    . lisinopril (PRINIVIL,ZESTRIL) 10 MG tablet Take 1 tablet (10 mg total) by mouth daily. 90 tablet 3  . montelukast (SINGULAIR) 10 MG tablet Take 1 tablet (10 mg total) by mouth daily. 90 tablet 0  . oxybutynin (DITROPAN-XL) 10 MG 24 hr tablet Take 1 tablet (10 mg total) by mouth daily. 30 tablet 11  . pantoprazole (PROTONIX) 20 MG tablet Take 1 tablet (20 mg total) by mouth 2 (two) times daily. 180 tablet 3  . potassium chloride (K-DUR,KLOR-CON) 10 MEQ tablet Take 1 tablet (10 mEq total) by mouth 2 (two) times daily. 180 tablet 3  . predniSONE (STERAPRED UNI-PAK 21 TAB) 10 MG (21) TBPK tablet Use as directed for 6 days 21 tablet 0  . rosuvastatin (CRESTOR) 10 MG tablet Take 1 tablet (10 mg total) by mouth daily. 90 tablet 3  . tadalafil (ADCIRCA/CIALIS) 20 MG tablet Take 1 tablet (20 mg total) by mouth daily as needed for erectile dysfunction. 15 tablet 11  . valACYclovir (VALTREX) 1000 MG tablet Take 1 tablet (1,000 mg total) by mouth 3 (three) times daily. 21 tablet 0  . vitamin B-12 1000 MCG tablet Take 1 tablet (1,000 mcg total) by mouth daily. 180 tablet 1   No current facility-administered medications for this visit.     Allergies as of 01/23/2019 - Review Complete 01/17/2019  Allergen Reaction Noted  . Tamsulosin Hives 06/12/2018  . Other Rash 06/12/2018  . Tamsulosin hcl Hives 03/28/2015  . Sulfa antibiotics Rash 01/02/2018    Review of Systems:     All systems reviewed and negative except where noted in HPI.   Observations/Objective:  Labs: CMP     Component Value Date/Time   NA 140 06/15/2018 0840   K 4.2 06/15/2018 0840   CL 98 06/15/2018 0840   CO2 25 06/15/2018 0840   GLUCOSE 93 06/15/2018 0840   GLUCOSE 96 01/02/2018 0519   BUN 11 06/15/2018 0840   CREATININE 0.95 06/15/2018 0840   CALCIUM 9.4 06/15/2018 0840   PROT 7.1 06/15/2018 0840   ALBUMIN 4.4 06/15/2018 0840   AST 16 06/15/2018 0840   ALT 12 06/15/2018 0840   ALKPHOS 51 06/15/2018 0840   BILITOT 0.4 06/15/2018 0840   GFRNONAA 80 06/15/2018 0840   GFRAA 92 06/15/2018 0840   Lab Results  Component Value Date   WBC 6.2 12/13/2018   HGB 14.4 12/13/2018   HCT 41.3 12/13/2018   MCV 87.7 12/13/2018   PLT 109 (L) 12/13/2018    Imaging Studies: No results found.  Assessment and Plan:   AARIZ MAISH is a 73 y.o. y/o male with history of alcoholic cirrhosis and iron deficiency anemia  Assessment and Plan: Anemia improved with iron replacement, continue follow-up with oncology  No further episodes of GI bleeding.  Last episode was in April 2019 when he was admitted with melena, with no specific source identified at the time despite EGD and colonoscopy and small bowel capsule.  Presumed source could be underlying small bowel AVMs  Patient advised to abstain from alcohol completely given his underlying cirrhosis.  Him and his wife present during the evaluation both verbalized understanding.  We will repeat [place] screening at this time and repeat meld labs.  Ultrasound and AFP.  I have messaged to his primary care provider Dr. Rosanna Randy to request hepatitis A and B vaccination since he is not immune as of October 2019 labs.  He was advised to follow-up with them in this regard as well.  Given his ongoing alcohol intake, patient will need continued variceal surveillance.  No prior history of varices on last EGD in October 2019  Due to the current COVID-19  situation, elective procedures are currently being scheduled for a later time as per nationwide recommendations. Therefore, the patient was informed of the need to schedule the procedure in the upcoming months, instead of sooner, since this is an elective procedure. Patient will need to be contacted at a later time to place him/her on the schedule. However, alarm symptoms were discussed in detail, and if these occur pt was advised to call us to discuss change in symptoms and evaluation for a more urgent procedure if appropriate. No indication for urgent procedure exist at this time. Patient was given the contact information to reach [place] with any questions, concerns, or change in symptoms.    Follow Up Instructions: Follow-up in clinic in 3 to 4 months Repeat EGD in 1 to 2 years from last EGD for variceal surveillance.  This can be scheduled at the time of next appointment   I discussed the assessment and treatment plan with the patient. The patient was provided an opportunity to ask questions and all were answered. The patient agreed with the plan and demonstrated an understanding of the instructions.   The patient was advised to call back or seek an in-person evaluation if the symptoms worsen or if the condition fails to improve as anticipated.  I provided 15 minutes of face-to-face time via video software during this encounter.   Virgel Manifold, MD  Speech recognition software was used to dictate this note.

## 2019-01-23 NOTE — Telephone Encounter (Signed)
I spoke with pt and wife to inform them of Ultrasound appt is at the Outpatient Imaging at the [facility] on 01/31/19 at 8:45 am and arrival time is 8:30 am. Nothing to eat or drink after midnight. Also aware to have blood work done at [facility].

## 2019-01-30 ENCOUNTER — Ambulatory Visit: Payer: Medicare Other | Admitting: Gastroenterology

## 2019-01-31 ENCOUNTER — Other Ambulatory Visit: Payer: Self-pay

## 2019-01-31 ENCOUNTER — Ambulatory Visit
Admission: RE | Admit: 2019-01-31 | Discharge: 2019-01-31 | Disposition: A | Discharge status: Home / Self Care | Payer: Medicare Other | Source: Ambulatory Visit | Attending: Gastroenterology | Admitting: Gastroenterology

## 2019-01-31 DIAGNOSIS — K7030 Alcoholic cirrhosis of liver without ascites: Secondary | ICD-10-CM | POA: Diagnosis not present

## 2019-02-01 LAB — COMPREHENSIVE METABOLIC PANEL WITH GFR
ALT: 26 IU/L (ref 0–44)
AST: 28 IU/L (ref 0–40)
Albumin/Globulin Ratio: 1.8 (ref 1.2–2.2)
Albumin: 4.5 g/dL (ref 3.7–4.7)
Alkaline Phosphatase: 72 IU/L (ref 39–117)
BUN/Creatinine Ratio: 10 (ref 10–24)
BUN: 10 mg/dL (ref 8–27)
Bilirubin Total: 0.3 mg/dL (ref 0.0–1.2)
CO2: 22 mmol/L (ref 20–29)
Calcium: 9.5 mg/dL (ref 8.6–10.2)
Chloride: 102 mmol/L (ref 96–106)
Creatinine, Ser: 0.98 mg/dL (ref 0.76–1.27)
GFR calc Af Amer: 88 mL/min/1.73 (ref >59)
GFR calc non Af Amer: 76 mL/min/1.73 (ref >59)
Globulin, Total: 2.5 g/dL (ref 1.5–4.5)
Glucose: 101 mg/dL — ABNORMAL HIGH (ref 65–99)
Potassium: 4.3 mmol/L (ref 3.5–5.2)
Sodium: 137 mmol/L (ref 134–144)
Total Protein: 7 g/dL (ref 6.0–8.5)

## 2019-02-01 LAB — AFP TUMOR MARKER: AFP, Serum, Tumor Marker: 3.6 ng/mL (ref 0.0–8.3)

## 2019-02-01 LAB — PROTIME-INR
INR: 1 (ref 0.8–1.2)
Prothrombin Time: 10.8 s (ref 9.1–12.0)

## 2019-02-10 ENCOUNTER — Other Ambulatory Visit: Payer: Self-pay

## 2019-02-12 ENCOUNTER — Telehealth: Payer: Self-pay

## 2019-02-12 ENCOUNTER — Other Ambulatory Visit: Payer: Self-pay

## 2019-02-12 ENCOUNTER — Inpatient Hospital Stay: Payer: Medicare Other | Attending: Hematology and Oncology

## 2019-02-12 DIAGNOSIS — Z79899 Other long term (current) drug therapy: Secondary | ICD-10-CM | POA: Insufficient documentation

## 2019-02-12 DIAGNOSIS — D696 Thrombocytopenia, unspecified: Secondary | ICD-10-CM | POA: Diagnosis not present

## 2019-02-12 DIAGNOSIS — E785 Hyperlipidemia, unspecified: Secondary | ICD-10-CM | POA: Insufficient documentation

## 2019-02-12 DIAGNOSIS — Q2733 Arteriovenous malformation of digestive system vessel: Secondary | ICD-10-CM | POA: Insufficient documentation

## 2019-02-12 DIAGNOSIS — Z87892 Personal history of anaphylaxis: Secondary | ICD-10-CM | POA: Diagnosis not present

## 2019-02-12 DIAGNOSIS — K7030 Alcoholic cirrhosis of liver without ascites: Secondary | ICD-10-CM | POA: Diagnosis not present

## 2019-02-12 DIAGNOSIS — Z8619 Personal history of other infectious and parasitic diseases: Secondary | ICD-10-CM | POA: Insufficient documentation

## 2019-02-12 DIAGNOSIS — Z87891 Personal history of nicotine dependence: Secondary | ICD-10-CM | POA: Insufficient documentation

## 2019-02-12 DIAGNOSIS — I700 Atherosclerosis of aorta: Secondary | ICD-10-CM | POA: Diagnosis not present

## 2019-02-12 DIAGNOSIS — Z7982 Long term (current) use of aspirin: Secondary | ICD-10-CM | POA: Diagnosis not present

## 2019-02-12 DIAGNOSIS — D500 Iron deficiency anemia secondary to blood loss (chronic): Secondary | ICD-10-CM | POA: Diagnosis present

## 2019-02-12 DIAGNOSIS — E538 Deficiency of other specified B group vitamins: Secondary | ICD-10-CM | POA: Diagnosis not present

## 2019-02-12 DIAGNOSIS — N529 Male erectile dysfunction, unspecified: Secondary | ICD-10-CM | POA: Insufficient documentation

## 2019-02-12 DIAGNOSIS — R319 Hematuria, unspecified: Secondary | ICD-10-CM | POA: Insufficient documentation

## 2019-02-12 DIAGNOSIS — N400 Enlarged prostate without lower urinary tract symptoms: Secondary | ICD-10-CM | POA: Insufficient documentation

## 2019-02-12 DIAGNOSIS — J439 Emphysema, unspecified: Secondary | ICD-10-CM | POA: Insufficient documentation

## 2019-02-12 LAB — CBC WITH DIFFERENTIAL/PLATELET
Abs Immature Granulocytes: 0.03 10*3/uL (ref 0.00–0.07)
Basophils Absolute: 0 10*3/uL (ref 0.0–0.1)
Basophils Relative: 1 %
Eosinophils Absolute: 0.3 10*3/uL (ref 0.0–0.5)
Eosinophils Relative: 5 %
HCT: 41.9 % (ref 39.0–52.0)
Hemoglobin: 14.7 g/dL (ref 13.0–17.0)
Immature Granulocytes: 1 %
Lymphocytes Relative: 25 %
Lymphs Abs: 1.4 10*3/uL (ref 0.7–4.0)
MCH: 31.4 pg (ref 26.0–34.0)
MCHC: 35.1 g/dL (ref 30.0–36.0)
MCV: 89.5 fL (ref 80.0–100.0)
Monocytes Absolute: 0.8 10*3/uL (ref 0.1–1.0)
Monocytes Relative: 14 %
Neutro Abs: 3.1 10*3/uL (ref 1.7–7.7)
Neutrophils Relative %: 54 %
Platelets: 96 10*3/uL — ABNORMAL LOW (ref 150–400)
RBC: 4.68 MIL/uL (ref 4.22–5.81)
RDW: 14 % (ref 11.5–15.5)
WBC: 5.7 10*3/uL (ref 4.0–10.5)
nRBC: 0 % (ref 0.0–0.2)

## 2019-02-12 LAB — VITAMIN B12: Vitamin B-12: 1357 pg/mL — ABNORMAL HIGH (ref 180–914)

## 2019-02-12 LAB — FERRITIN: Ferritin: 156 ng/mL (ref 24–336)

## 2019-02-12 NOTE — Progress Notes (Signed)
[facility]  [address] [place], [place] [address] Phone: [phone]  Fax: [phone]   Telemedicine Office Visit:  02/13/2019  Referring physician: Jerrol Banana.,*  I connected with [name] on 02/13/2019 at 11:53 AM by videoconferencing and verified that I was speaking with the correct person using 2 identifiers.  The patient was at home.  I discussed the limitations, risk, security and privacy concerns of performing an evaluation and management service by videoconferencing and the availability of in person appointments.  I also discussed with the patient that there may be a patient responsible charge related to this service.  The patient expressed understanding and agreed to proceed.   Chief Complaint: Dwayne Thompson is a 73 y.o. 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  HPI:  The patient was last seen in the hematology clinic on 08/15/2018.  At that time, he was doing well overall.  He was fatigued.  He denied exertional shortness of breath, B symptoms, interval infections, or bleeding. Patient continued to drink alcohol on a daily basis, however he noted that he had made efforts to reduce the amount by drinking "nonalcoholic beers".  Exam was grossly unremarkable. WBC was 5900 ([place] 3800). Hemoglobin 12.9, hematocrit 37.2, and platelets 124,000.  Low dose lung cancer screening chest CT on 09/03/2018 revealed a new area of subpleural consolidation in the right upper lobe, possibly infectious or malignant in etiology. There was a score of Lung-RADS 4B, suspicious. There was emphysema and aortic atherosclerosis.   He was seen in pulmonology by Dr. Vernard Gambles on 09/10/2018 for abnormal findings on chest CT. He was given a 5-day course of azithromycin and prednisone and CT chest was scheduled for three weeks.  Chest CT on 09/24/2018 revealed improving subpleural  patchy/nodular opacity in the right upper lobe. Interval change favored an infectious/inflammatory etiology. 6-12 week follow-up was recommended.   Pulmonary function test on 09/25/2018 revealed no obvious signs of obstructive or restrictive lung disease.   Chest CT on 12/04/2018 revealed continued mild interval improvement in pleural based opacity over the anterolateral right upper lobe measuring 1 x 2.3 cm (previously 1.3 x 2.3 cm) likely resolving infectious/inflammatory process. New similar appearing pleural based opacity over the anterolateral aspect of the right apex measuring 9 x 15 mm also likely representing infectious/inflammatory process. Follow-up chest CT 3 months was recommended.  He was seen in Urology by Dr. Caroll Rancher on 09/25/2018 for an enlarged prostate an overactive bladder. He continued on Ditropan XL daily and was advised to modify his behavior and reduce alcohol consumption.   CBC on 11/13/2018 revealed a hematocrit of 40.7, hemoglobin 14.5, MCV 86.8, platelets 93,000, WBC 5600 ([place] 3300).   He was seen in gastroenterology by Dr. [name] on 16/96/7893 for alcoholic cirrhosis.  His anemia was improving and he had no further episodes of GI bleeding.  He was advised to abstain from alcohol completely. Hepatitis vaccinations were suggested. He was scheduled for follow-up in 3 -4 months.  Repeat EGD was anticipated in 1-2 years for variceal surveillance.   CBC on 02/12/2019 revealed a hematocrit of 41.9, hemoglobin 14.7, MCV 89.5, platelets 96,000, WBC 5700 ([place] 3100).   During the interim, the patient reports "I'm doing fine." He reports redness on a wipe following urination this morning and erectile dysfunction. Itching on his legs has subsided.   He reports he is trying to decrease  his alcohol intake, and is drinking 2 shots daily.    Past Medical History:  Diagnosis Date  . Anemia   . Back pain   . BPH (benign prostatic hyperplasia)   . ED (erectile  dysfunction)   . Hyperlipidemia     Past Surgical History:  Procedure Laterality Date  . COLONOSCOPY Left 01/03/2018   Procedure: COLONOSCOPY;  Surgeon: Virgel Manifold, MD;  Location: [facility] ENDOSCOPY;  Service: Endoscopy;  Laterality: Left;  . COLONOSCOPY WITH PROPOFOL N/A 04/05/2018   Procedure: COLONOSCOPY WITH PROPOFOL;  Surgeon: Virgel Manifold, MD;  Location: [facility] ENDOSCOPY;  Service: Endoscopy;  Laterality: N/A;  . ENTEROSCOPY  01/03/2018   Procedure: ENTEROSCOPY;  Surgeon: Virgel Manifold, MD;  Location: [facility] ENDOSCOPY;  Service: Endoscopy;;  . ESOPHAGOGASTRODUODENOSCOPY N/A 01/02/2018   Procedure: ESOPHAGOGASTRODUODENOSCOPY (EGD);  Surgeon: Virgel Manifold, MD;  Location: [facility] ENDOSCOPY;  Service: Endoscopy;  Laterality: N/A;  . ESOPHAGOGASTRODUODENOSCOPY (EGD) WITH PROPOFOL N/A 04/05/2018   Procedure: ESOPHAGOGASTRODUODENOSCOPY (EGD) WITH PROPOFOL;  Surgeon: Virgel Manifold, MD;  Location: [facility] ENDOSCOPY;  Service: Endoscopy;  Laterality: N/A;  . HERNIA REPAIR     1980's    Family History  Problem Relation Age of Onset  . Alzheimer's disease Mother   . Sarcoidosis Sister   . Diabetes Brother   . Hypertension Brother   . Anxiety disorder Brother   . Kidney disease Neg Hx   . Prostate cancer Neg Hx     Social History:  reports that he quit smoking about 5 years ago. His smoking use included cigarettes. He has a 50.00 pack-year smoking history. He has never used smokeless tobacco. He reports current alcohol use of about 21.0 - 28.0 standard drinks of alcohol per week. He reports that he does not use drugs.  He drinks 2-3 alcohol shots/day.  His wife's name is [name]  and granddaughter's name is Chemical engineer.  He is accompanied by his wife today.   Participants in the patient's visit and their role in the encounter included the patient, his wife, and Vito Berger, CMA, today.  The intake visit was provided by Vito Berger, CMA.  Allergies:  Allergies   Allergen Reactions  . Tamsulosin Hives  . Other Rash  . Tamsulosin Hcl Hives  . Sulfa Antibiotics Rash    Current Medications: Current Outpatient Medications  Medication Sig Dispense Refill  . aspirin EC 81 MG EC tablet Take 1 tablet (81 mg total) by mouth daily. 180 tablet 0  . cetirizine (ZYRTEC) 10 MG tablet Take 1 tablet (10 mg total) by mouth daily. 90 tablet 4  . Cholecalciferol 1000 UNITS tablet Take 1,000 Units by mouth daily. Reported on 03/03/2016    . cyclobenzaprine (FLEXERIL) 10 MG tablet Take one tablet at night and then take 1 tablet twice a day PRN 90 tablet 1  . doxazosin (CARDURA) 4 MG tablet Take 1 tablet (4 mg total) by mouth daily. 90 tablet 3  . folic acid (FOLVITE) 1 MG tablet Take 1 tablet (1 mg total) by mouth daily. 30 tablet 0  . HYDROcodone-acetaminophen (NORCO/VICODIN) 5-325 MG tablet hydrocodone 5 mg-acetaminophen 325 mg tablet  Take 1-2 every 6-8 hours if needed for pain    . lisinopril (PRINIVIL,ZESTRIL) 10 MG tablet Take 1 tablet (10 mg total) by mouth daily. 90 tablet 3  . montelukast (SINGULAIR) 10 MG tablet Take 1 tablet (10 mg total) by mouth daily. 90 tablet 0  . oxybutynin (DITROPAN-XL) 10 MG 24 hr tablet Take 1 tablet (10 mg  total) by mouth daily. 30 tablet 11  . pantoprazole (PROTONIX) 20 MG tablet Take 1 tablet (20 mg total) by mouth 2 (two) times daily. 180 tablet 3  . potassium chloride (K-DUR,KLOR-CON) 10 MEQ tablet Take 1 tablet (10 mEq total) by mouth 2 (two) times daily. 180 tablet 3  . rosuvastatin (CRESTOR) 10 MG tablet Take 1 tablet (10 mg total) by mouth daily. 90 tablet 3  . vitamin B-12 1000 MCG tablet Take 1 tablet (1,000 mcg total) by mouth daily. 180 tablet 1  . acetaminophen (TYLENOL) 325 MG tablet Take 2 tablets (650 mg total) by mouth 3 (three) times daily. (Patient not taking: Reported on 02/13/2019) 180 tablet 0  . predniSONE (STERAPRED UNI-PAK 21 TAB) 10 MG (21) TBPK tablet Use as directed for 6 days (Patient not taking: Reported  on 02/13/2019) 21 tablet 0  . tadalafil (ADCIRCA/CIALIS) 20 MG tablet Take 1 tablet (20 mg total) by mouth daily as needed for erectile dysfunction. (Patient not taking: Reported on 02/13/2019) 15 tablet 11  . valACYclovir (VALTREX) 1000 MG tablet Take 1 tablet (1,000 mg total) by mouth 3 (three) times daily. (Patient not taking: Reported on 02/13/2019) 21 tablet 0   No current facility-administered medications for this visit.     Review of Systems  Constitutional: Negative.  Negative for diaphoresis, fever, malaise/fatigue and weight loss.       "I'm doing fine."  HENT: Negative for congestion, ear pain, hearing loss, nosebleeds and sore throat.   Eyes: Negative.  Negative for blurred vision, double vision, photophobia and pain.  Respiratory: Negative.  Negative for cough, hemoptysis, sputum production and shortness of breath.   Cardiovascular: Negative.  Negative for chest pain, palpitations, orthopnea, leg swelling and PND.  Gastrointestinal: Negative.  Negative for abdominal pain, blood in stool, constipation, diarrhea, melena, nausea and vomiting.  Genitourinary: Positive for hematuria (mild, one episode). Negative for dysuria, frequency and urgency.  Musculoskeletal: Negative.  Negative for back pain, falls, joint pain and myalgias.  Skin: Negative.  Negative for itching and rash.  Neurological: Negative.  Negative for dizziness, tremors, speech change, focal weakness, weakness and headaches.  Endo/Heme/Allergies: Negative.  Does not bruise/bleed easily.  Psychiatric/Behavioral: Negative.  Negative for depression and memory loss. The patient is not nervous/anxious and does not have insomnia.   All other systems reviewed and are negative.   Performance status (ECOG): 1  Physical Exam  Constitutional: He is oriented to person, place, and time. He appears well-developed and well-nourished. No distress.  HENT:  Short brown hair with graying mustache.   Eyes: Pupils are equal, round, and  reactive to light. Conjunctivae and EOM are normal. No scleral icterus.  Brown eyes. Glasses.  Neurological: He is alert and oriented to person, place, and time.  Psychiatric: He has a normal mood and affect. His behavior is normal. Judgment and thought content normal.  Nursing note reviewed.   Imaging studies: 09/03/2018:  Low dose chest CT revealed a new area of subpleural consolidation in the right upper lobe, possibly infectious or malignant in etiology. There was a score of Lung-RADS 4B, suspicious. There was emphysema and aortic atherosclerosis.  09/24/2018:  Chest CT revealed improving subpleural patchy/nodular opacity in the right upper lobe. Interval change favored an infectious/inflammatory etiology.  12/04/2018:  Chest CT revealed continued mild interval improvement in pleural based opacity over the anterolateral right upper lobe measuring 1 x 2.3 cm (previously 1.3 x 2.3 cm) likely resolving infectious/inflammatory process. New similar appearing pleural based opacity over the  anterolateral aspect of the right apex measuring 9 x 15 mm also likely representing infectious/inflammatory process.    Appointment on 02/12/2019  Component Date Value Ref Range Status  . Vitamin B-12 02/12/2019 1,357* 180 - 914 pg/mL Final   Comment: (NOTE) This assay is not validated for testing neonatal or myeloproliferative syndrome specimens for Vitamin B12 levels. Performed at [facility], [place] [address], [place], [place] [address]   . Ferritin 02/12/2019 156  24 - 336 ng/mL Final   Performed at [facility], [address] [place], [place] [address]  . WBC 02/12/2019 5.7  4.0 - 10.5 K/uL Final  . RBC 02/12/2019 4.68  4.22 - 5.81 MIL/uL Final  . Hemoglobin 02/12/2019 14.7  13.0 - 17.0 g/dL Final  . HCT 02/12/2019 41.9  39.0 - 52.0 % Final  . MCV 02/12/2019 89.5  80.0 - 100.0 fL Final  . MCH 02/12/2019 31.4  26.0 - 34.0 pg Final  . MCHC 02/12/2019 35.1  30.0 - 36.0 g/dL Final  .  RDW 02/12/2019 14.0  11.5 - 15.5 % Final  . Platelets 02/12/2019 96* 150 - 400 K/uL Final   Comment: Immature Platelet Fraction may be clinically indicated, consider ordering this additional test [id number]   . nRBC 02/12/2019 0.0  0.0 - 0.2 % Final  . Neutrophils Relative % 02/12/2019 54  % Final  . Neutro Abs 02/12/2019 3.1  1.7 - 7.7 K/uL Final  . Lymphocytes Relative 02/12/2019 25  % Final  . Lymphs Abs 02/12/2019 1.4  0.7 - 4.0 K/uL Final  . Monocytes Relative 02/12/2019 14  % Final  . Monocytes Absolute 02/12/2019 0.8  0.1 - 1.0 K/uL Final  . Eosinophils Relative 02/12/2019 5  % Final  . Eosinophils Absolute 02/12/2019 0.3  0.0 - 0.5 K/uL Final  . Basophils Relative 02/12/2019 1  % Final  . Basophils Absolute 02/12/2019 0.0  0.0 - 0.1 K/uL Final  . Immature Granulocytes 02/12/2019 1  % Final  . Abs Immature Granulocytes 02/12/2019 0.03  0.00 - 0.07 K/uL Final   Performed at [facility], [address] [place], [place] [address]    Assessment:  Dwayne Thompson is a 73 y.o. 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 Platelet count has ranged between 101,000 - 134,000.  During his active GI bleed platelet nadir was 83,000.  He has B12 deficiencyand folate deficiency. B12 was 221 on 01/03/2018 and 1357 on 02/12/2019.  Folate was 5.7 on 01/03/2018 and 25 on 08/15/2018.  He is on oral J28 and folic acid.  Diet is good.  He drinks alcohol daily.  Anemia and thrombocytopenia work-up on 01/04/2018 revealed the following normal studies: hepatitis B surface antigen, hepatitis B core antibody total, hepatitis C antibody, and ANA.  Ferritin was 272.  Iron saturation was 34% with a TIBC of 265.  HIV testing was negative on 01/10/2018.  Abdominal ultrasoundon 01/03/2018 revealed a normal spleen.  Ferritin has been followed: 272 on 01/03/2018, 231 on 01/10/2018, 177 on 02/14/2018, 79  on 05/16/2018, 197 on 08/15/2018, 107 on 11/13/2018, and 156 on 02/12/2019.  Upper endoscopyon 01/02/2018 revealed a normal esophagus, erythematous mucosa in the antrum, erythematous duodenopathy, and nodular mucosa in the duodenal bulb. There was no ulcerations or active sites of bleeding. Pathology revealed mild acute duodenitis, negative for dysplasia and malignancy.  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  There was no dysplasia or malignancy.  Colonoscopyon 01/03/2018 revealed melena in the entire colon c/w bleeding from the proximal GI tract. Small bowel enteroscopyon 01/03/2018 revealed no actively bleeding lesions. The examined portion of the jejunum was normal.   Capsule study on 01/03/2018 revealed a red lesion in the first 7% of the study likely representing duodenal erythema and nodularity noted on recent EGD.  There was a red spot at 78% with subsequent small red blood strands which could represent an oozing AVM in the distal small bowel.  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  Symptomatically, he feels fine.  He notes one episode of hematuria.  He is cutting back his alcohol.  Plan: 1. Review labs from 02/12/2019. 2. Normocytic anemia Labs reviewed.  Hemoglobin 14.7, hematocrit 41.9, and MCV 89.5. Ferritin 156 on 02/12/2019. He has a history of gastritis, duodenitis, and small bowel AVMs. 3. B12 deficiency He continues daily oral B12 1000 mcg supplementation. B12 level was 1357 on 02/12/2019. 4. Folate deficiency He continues on daily folic acid 1 mg supplementation. Folate was 25 on 08/15/2018. 5. Thrombocytopenia Platelets are 96,000. He has a history of H. pylori (treated).  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 Encourage decrease in daily alcohol, which may be contributory.  Exam reveals no splenomegaly. Continue surveillance. 6. Abnormal chest CT   Review interval chest CTs.  Last chest CT on 12/04/2018 showed improvement suggesting benign inflammatory process.  Follow-up chest CT on 02/15/2019. 7. Hematuria  One brief episode.  Patient denies any history of nephrolithiasis.  Patient follows up with Dr Diamantina Providence, urologist. 8.   RTC in 3 months for labs (CBC with diff, ferritin). 9.   RTC in 6 months for MD assessment and labs (CBC with diff, ferritin, folate).  Addendum:  Chest CT on 02/15/2019 revealed advanced centrilobular and paraseptal emphysema.  The previously seen subpleural masslike density in the right upper lobe has nearly completely resolved with minimal residual scarring.  The small subpleural right middle lobe nodule was stable.  The small subpleural medial left upper lobe nodules were stable since recent study but have slowly enlarged in size since 2017. Recommend continued attention on follow-up imaging. These could be followed with repeat CT in 1 year.  He has oronary artery disease, aortic atherosclerosis.  He has a 4 cm ascending thoracic aortic aneurysm.  Recommend annual imaging.  I discussed the assessment and treatment plan with the patient.  The patient was provided an opportunity to ask questions and all were answered.  The patient agreed with the plan and demonstrated an understanding of the instructions.  The patient was advised to call back or seek an in person evaluation if the symptoms worsen or if the condition fails to improve as anticipated.  I provided 14 minutes (11:53 AM - 12:07 PM) of face-to-face video visit time during this this encounter and > 50% was spent counseling as documented under my assessment and plan.  I provided these services from the [facility] office.   Nolon Stalls, MD, PhD  02/13/2019, 11:53 AM  I, Molly Dorshimer, am acting as Education administrator for [employer]. Mike Gip, MD, PhD.  I, Melissa C. Mike Gip, MD, have reviewed the above documentation for accuracy and completeness, and I agree with the  above.

## 2019-02-12 NOTE — Telephone Encounter (Signed)
Pt notified of results and need of having ultrasound every 6 months.

## 2019-02-12 NOTE — Telephone Encounter (Signed)
-----   Message from Virgel Manifold, MD sent at 02/12/2019  1:53 PM EDT ----- MELDNa 6 Terika Pillard please let patient know, his studies results showed normal liver enzymes and liver function.  His ultrasound does show cirrhosis and liver cysts stable from before.  We will continue to do your ultrasounds every 6 months

## 2019-02-13 ENCOUNTER — Inpatient Hospital Stay (HOSPITAL_BASED_OUTPATIENT_CLINIC_OR_DEPARTMENT_OTHER): Payer: Medicare Other | Admitting: Hematology and Oncology

## 2019-02-13 ENCOUNTER — Encounter: Payer: Self-pay | Admitting: Hematology and Oncology

## 2019-02-13 DIAGNOSIS — D500 Iron deficiency anemia secondary to blood loss (chronic): Secondary | ICD-10-CM | POA: Diagnosis not present

## 2019-02-13 DIAGNOSIS — Z79899 Other long term (current) drug therapy: Secondary | ICD-10-CM

## 2019-02-13 DIAGNOSIS — E538 Deficiency of other specified B group vitamins: Secondary | ICD-10-CM

## 2019-02-13 DIAGNOSIS — Z7982 Long term (current) use of aspirin: Secondary | ICD-10-CM

## 2019-02-13 DIAGNOSIS — R9389 Abnormal findings on diagnostic imaging of other specified body structures: Secondary | ICD-10-CM | POA: Diagnosis not present

## 2019-02-13 DIAGNOSIS — D696 Thrombocytopenia, unspecified: Secondary | ICD-10-CM

## 2019-02-13 NOTE — Progress Notes (Signed)
No new changes noted today. The patient name, DOB and address has been verified by phone today.

## 2019-02-15 ENCOUNTER — Other Ambulatory Visit: Payer: Self-pay

## 2019-02-15 ENCOUNTER — Ambulatory Visit
Admission: RE | Admit: 2019-02-15 | Discharge: 2019-02-15 | Disposition: A | Discharge status: Home / Self Care | Payer: Medicare Other | Source: Ambulatory Visit | Attending: Pulmonary Disease | Admitting: Pulmonary Disease

## 2019-02-15 DIAGNOSIS — J432 Centrilobular emphysema: Secondary | ICD-10-CM | POA: Insufficient documentation

## 2019-02-18 ENCOUNTER — Telehealth: Payer: Self-pay | Admitting: Pulmonary Disease

## 2019-02-18 NOTE — Telephone Encounter (Signed)
Called patient for COVID-19 pre-screening for in office visit.  Have you recently traveled any where out of the local area in the last 2 weeks? No  Have you been in close contact with a person diagnosed with COVID-19 within the last 2 weeks? NO Do you currently have any of the following symptoms? If so, when did they start? Cough     Diarrhea   Joint Pain Fever      Muscle Pain   Red eyes Shortness of breath   Abdominal pain  Vomiting Loss of smell    Rash    Sore Throat Headache    Weakness   Bruising or bleeding   Okay to proceed with visit 02/19/2019

## 2019-02-19 ENCOUNTER — Ambulatory Visit (INDEPENDENT_AMBULATORY_CARE_PROVIDER_SITE_OTHER): Payer: Medicare Other | Admitting: Pulmonary Disease

## 2019-02-19 ENCOUNTER — Encounter: Payer: Self-pay | Admitting: Pulmonary Disease

## 2019-02-19 ENCOUNTER — Other Ambulatory Visit: Payer: Self-pay

## 2019-02-19 VITALS — BP 122/68 | HR 91 | Ht 71.5 in | Wt 208.0 lb

## 2019-02-19 DIAGNOSIS — J432 Centrilobular emphysema: Secondary | ICD-10-CM | POA: Diagnosis not present

## 2019-02-19 DIAGNOSIS — R490 Dysphonia: Secondary | ICD-10-CM

## 2019-02-19 DIAGNOSIS — I712 Thoracic aortic aneurysm, without rupture, unspecified: Secondary | ICD-10-CM

## 2019-02-19 DIAGNOSIS — R918 Other nonspecific abnormal finding of lung field: Secondary | ICD-10-CM | POA: Diagnosis not present

## 2019-02-19 DIAGNOSIS — J310 Chronic rhinitis: Secondary | ICD-10-CM | POA: Diagnosis not present

## 2019-02-19 NOTE — Progress Notes (Signed)
Subjective:    Patient ID: Dwayne Thompson, male    DOB: 11-13-1945, 73 y.o.   MRN: [id number]  HPI  Patient is a 73 year old former smoker who presents for follow up  of abnormal findings of a low  dose chest CT scan performed for lung cancer screening.  There was a new subpleural consolidation on the right upper lobe associated with bullous changes that was noted.  It was difficult to discern whether this was a mass versus an infiltrate as the area also showed some air bronchograms into it.    The patient had follow-up CT for this issue in January 2020.  The area in question was decreasing in size and is likely related to inflammation/infection.  This was followed in March 2020 showing further improvement.  Because of COVID-19 his follow-up visit here was was bone until now.  He had another follow-up CT on 15 Feb 2019.  This shows almost complete resolution of the findings in question.  I have independently reviewed this film and have reviewed it with the patient.  Incidental finding was of a 4 cm ascending thoracic aorta aneurysm and I have discussed this with the patient.    The patient has not had any fevers, chills or sweats.  No change in his cough which is usually in the mornings and productive of whitish sputum.  He does note nasal congestion which has been a chronic issue.  He does note intermittent hoarseness.  He relates this mostly to nasal congestion and postnasal drip. No hemoptysis noted.  No epistaxis.  No other issues. Patient's weight and appetite are stable.  Note is made that the patient is on an ACE inhibitor.  Patient also drinks on a daily basis at least 2-3 "shots" per day  Patient had pulmonary function testing performed on 25 September 2018 this shows that he has preserved FEV1 at 2.98 L or 105% predicted.  Diffusion capacity was moderately reduced at 53%.  This is consistent with emphysema with preserved FEV1.  The patient is not experiencing any dyspnea.   Review of Systems   Constitutional: Negative.   HENT: Positive for congestion, postnasal drip and voice change.   Eyes: Negative.   Respiratory: Positive for cough.   Gastrointestinal: Negative.   Endocrine: Negative.   Musculoskeletal: Negative.   Skin: Negative.   Allergic/Immunologic: Negative.   Neurological: Negative.   All other systems reviewed and are negative.      Objective:   Physical Exam Vitals signs reviewed.  Constitutional:      Appearance: Normal appearance. He is normal weight. He is not ill-appearing.  HENT:     Head: Normocephalic and atraumatic.     Nose:     Comments: Limited nose, mouth and throat exam due to masking requirements for COVID-19 Eyes:     General: No scleral icterus.    Conjunctiva/sclera: Conjunctivae normal.     Pupils: Pupils are equal, round, and reactive to light.  Neck:     Musculoskeletal: Neck supple.  Cardiovascular:     Rate and Rhythm: Normal rate and regular rhythm.     Heart sounds: Normal heart sounds.  Pulmonary:     Effort: Pulmonary effort is normal.     Breath sounds: Normal breath sounds.  Abdominal:     General: Abdomen is flat. There is no distension.  Musculoskeletal: Normal range of motion.     Right lower leg: No edema.     Left lower leg: No edema.  Lymphadenopathy:  Cervical: No cervical adenopathy.  Skin:    General: Skin is warm and dry.  Neurological:     General: No focal deficit present.     Mental Status: He is alert and oriented to person, place, and time.  Psychiatric:        Mood and Affect: Mood normal.        Behavior: Behavior normal.      Assessment & Plan:  1.  Abnormal findings on CT of the chest: These findings have almost completely resolved.  We will repeat CT scan of the chest in 6 months time.  We will see him in follow-up at that time.  He is to contact us prior to that time should any new difficulties arise.  2.  Ascending thoracic aortic aneurysm, 4 cm: Referral to vascular surgery for close  monitoring and follow-up.  This was discussed with the patient.  3.  Emphysema centrilobular/paraseptal: Appears significant by CT. he has preserved FEV1 but moderate diffusion capacity impairment.  This is consistent with emphysema with preserved FEV1.  Does not have dyspnea.  No other complaints.   4.  Perennial rhinitis: I suspect that his patient has significant perennial rhinitis with postnasal drip.  I suspect that his morning cough is more related to this issue.  Continue montelukast and may add Allegra or Claritin to see if symptoms improve.  5.  Intermittent hoarseness: Suspect this is related to his postnasal drip.  Additionally the patient is on an ACE inhibitor which could also be aggravating this issue.  Recommend considering switching to a different medication for blood pressure control.  I have recommended the patient discuss with his primary care physician if management of his rhinitis and postnasal drip do not improve his issues.  Lastly, the patient does imbibe alcohol daily and he was reminded that this can aggravate gastroesophageal reflux which can also issues with hoarseness.

## 2019-02-19 NOTE — Patient Instructions (Addendum)
1.  Your CT scan of the chest showed continued improvement on the area of concern indicating that this was inflammation or infection.  In this regard, we will see you in follow-up in 6 months time with another CT scan of the chest.  2.  The CT scan of the chest showed that you have an enlargement of the main vessel going from the heart to your body called the aorta.  This does not require CT scan surgery at present but should be followed carefully by a vascular surgeon.  We will refer you to vascular surgery for follow-up.  3.  With regards to your hoarseness several things to consider one would be issues with postnasal drip recommend trying some Claritin or Allegra to see if this improves your symptoms.  These can be obtained over-the-counter.  Additionally, hoarseness can also be caused by 1 of your blood pressure medications called lisinopril.  You may want to discuss with Dr. Rosanna Randy changing this medication to a different one.  Lastly, alcohol can cause issues with stomach reflux that you may not be aware of and these can cause hoarseness.  Recommend limiting your alcohol intake.

## 2019-02-26 ENCOUNTER — Other Ambulatory Visit: Payer: Self-pay

## 2019-02-26 DIAGNOSIS — D696 Thrombocytopenia, unspecified: Secondary | ICD-10-CM

## 2019-02-27 LAB — H. PYLORI ANTIGEN, STOOL: H. Pylori Stool Ag, Eia: NEGATIVE

## 2019-03-04 ENCOUNTER — Other Ambulatory Visit: Payer: Self-pay

## 2019-03-04 NOTE — Telephone Encounter (Signed)
Please review chart, okay to approve? KW

## 2019-03-04 NOTE — Telephone Encounter (Signed)
Patient's wife states patient is almost out of pantoprazole (PROTONIX) 20 MG tablet. The mail order has not been delivered yet. They are requesting a week supply of medication be sent to [place].

## 2019-03-07 MED ORDER — PANTOPRAZOLE SODIUM 20 MG PO TBEC: 20.0000 mg | DELAYED_RELEASE_TABLET | Freq: Two times a day (BID) | ORAL | 3 refills | Status: DC

## 2019-03-07 NOTE — Telephone Encounter (Signed)
Patients wife [name] called back stating that patient has been out of this medication for the past 4 days. She says mail order prescription will not arrive until a few more day. Patient is requesting a temporary supply to be sent to [facility]. Please advise

## 2019-03-08 NOTE — Telephone Encounter (Signed)
Yes--30 days.

## 2019-03-11 ENCOUNTER — Other Ambulatory Visit: Payer: Self-pay | Admitting: Pulmonary Disease

## 2019-03-12 ENCOUNTER — Ambulatory Visit (INDEPENDENT_AMBULATORY_CARE_PROVIDER_SITE_OTHER): Payer: Medicare Other | Admitting: Vascular Surgery

## 2019-03-12 ENCOUNTER — Encounter (INDEPENDENT_AMBULATORY_CARE_PROVIDER_SITE_OTHER): Payer: Self-pay | Admitting: Vascular Surgery

## 2019-03-12 ENCOUNTER — Other Ambulatory Visit: Payer: Self-pay

## 2019-03-12 DIAGNOSIS — Z79899 Other long term (current) drug therapy: Secondary | ICD-10-CM | POA: Diagnosis not present

## 2019-03-12 DIAGNOSIS — Z87891 Personal history of nicotine dependence: Secondary | ICD-10-CM | POA: Diagnosis not present

## 2019-03-12 DIAGNOSIS — I712 Thoracic aortic aneurysm, without rupture, unspecified: Secondary | ICD-10-CM

## 2019-03-12 DIAGNOSIS — I10 Essential (primary) hypertension: Secondary | ICD-10-CM | POA: Diagnosis not present

## 2019-03-12 NOTE — Assessment & Plan Note (Signed)
Blood pressure control is of paramount importance in reducing enlargement of thoracic aortic aneurysms particularly in the ascending component.  His blood pressure control is excellent on 2 medications.

## 2019-03-12 NOTE — Progress Notes (Signed)
Patient ID: Dwayne Thompson, male   DOB: 10-Jul-1946, 73 y.o.   MRN: [id number]  Chief Complaint  Patient presents with   New Patient (Initial Visit)    ref Dwayne Thompson for thoracic aortic aneurysm    HPI Dwayne Thompson is a 73 y.o. male.  I am asked to see the patient by Dr. Duwayne Thompson for evaluation of a thoracic aortic aneurysm.  This was seen on a recent CT scan for lung cancer screening.  I have independently reviewed the CT scan.  His ascending thoracic aorta measures 4.0 cm in maximal diameter which would just be into the range consider an aneurysm.  The patient quit smoking 6 years ago.  He is on blood pressure medicines with really good blood pressure control.  He currently has no chest pain, upper back pain, or signs of peripheral embolization.     Past Medical History:  Diagnosis Date   Anemia    Back pain    BPH (benign prostatic hyperplasia)    ED (erectile dysfunction)    Hyperlipidemia     Past Surgical History:  Procedure Laterality Date   COLONOSCOPY Left 01/03/2018   Procedure: COLONOSCOPY;  Surgeon: Virgel Manifold, MD;  Location: [facility] ENDOSCOPY;  Service: Endoscopy;  Laterality: Left;   COLONOSCOPY WITH PROPOFOL N/A 04/05/2018   Procedure: COLONOSCOPY WITH PROPOFOL;  Surgeon: Virgel Manifold, MD;  Location: [facility] ENDOSCOPY;  Service: Endoscopy;  Laterality: N/A;   ENTEROSCOPY  01/03/2018   Procedure: ENTEROSCOPY;  Surgeon: Virgel Manifold, MD;  Location: [facility]COPY;  Service: Endoscopy;;   ESOPHAGOGASTRODUODENOSCOPY N/A 01/02/2018   Procedure: ESOPHAGOGASTRODUODENOSCOPY (EGD);  Surgeon: Virgel Manifold, MD;  Location: [facility] ENDOSCOPY;  Service: Endoscopy;  Laterality: N/A;   ESOPHAGOGASTRODUODENOSCOPY (EGD) WITH PROPOFOL N/A 04/05/2018   Procedure: ESOPHAGOGASTRODUODENOSCOPY (EGD) WITH PROPOFOL;  Surgeon: Virgel Manifold, MD;  Location: [facility] ENDOSCOPY;  Service: Endoscopy;  Laterality: N/A;   HERNIA REPAIR     1980's    Family  History Family History  Problem Relation Age of Onset   Alzheimer's disease Mother    Sarcoidosis Sister    Diabetes Brother    Hypertension Brother    Anxiety disorder Brother    Kidney disease Neg Hx    Prostate cancer Neg Hx     Social History Social History   Tobacco Use   Smoking status: Former Smoker    Packs/day: 1.00    Years: 50.00    Pack years: 50.00    Types: Cigarettes    Quit date: 10/20/2013    Years since quitting: 5.3   Smokeless tobacco: Never Used  Substance Use Topics   Alcohol use: Yes    Alcohol/week: 21.0 - 28.0 standard drinks    Types: 21 - 28 Shots of liquor per week    Comment: 3-4 gin and sodas a day   Drug use: No     Allergies  Allergen Reactions   Tamsulosin Hives   Other Rash   Tamsulosin Hcl Hives   Sulfa Antibiotics Rash    Current Outpatient Medications  Medication Sig Dispense Refill   acetaminophen (TYLENOL) 325 MG tablet Take 2 tablets (650 mg total) by mouth 3 (three) times daily. 180 tablet 0   aspirin EC 81 MG EC tablet Take 1 tablet (81 mg total) by mouth daily. 180 tablet 0   cetirizine (ZYRTEC) 10 MG tablet Take 1 tablet (10 mg total) by mouth daily. 90 tablet 4   Cholecalciferol 1000 UNITS tablet Take 1,000 Units  by mouth daily. Reported on 03/03/2016     cyclobenzaprine (FLEXERIL) 10 MG tablet Take one tablet at night and then take 1 tablet twice a day PRN 90 tablet 1   doxazosin (CARDURA) 4 MG tablet Take 1 tablet (4 mg total) by mouth daily. 90 tablet 3   folic acid (FOLVITE) 1 MG tablet Take 1 tablet (1 mg total) by mouth daily. 30 tablet 0   HYDROcodone-acetaminophen (NORCO/VICODIN) 5-325 MG tablet hydrocodone 5 mg-acetaminophen 325 mg tablet  Take 1-2 every 6-8 hours if needed for pain     lisinopril (PRINIVIL,ZESTRIL) 10 MG tablet Take 1 tablet (10 mg total) by mouth daily. 90 tablet 3   montelukast (SINGULAIR) 10 MG tablet Take 1 tablet (10 mg total) by mouth daily. 90 tablet 0    oxybutynin (DITROPAN-XL) 10 MG 24 hr tablet Take 1 tablet (10 mg total) by mouth daily. 30 tablet 11   pantoprazole (PROTONIX) 20 MG tablet Take 1 tablet (20 mg total) by mouth 2 (two) times daily. 60 tablet 3   potassium chloride (K-DUR,KLOR-CON) 10 MEQ tablet Take 1 tablet (10 mEq total) by mouth 2 (two) times daily. 180 tablet 3   predniSONE (STERAPRED UNI-PAK 21 TAB) 10 MG (21) TBPK tablet Use as directed for 6 days 21 tablet 0   rosuvastatin (CRESTOR) 10 MG tablet Take 1 tablet (10 mg total) by mouth daily. 90 tablet 3   tadalafil (ADCIRCA/CIALIS) 20 MG tablet Take 1 tablet (20 mg total) by mouth daily as needed for erectile dysfunction. 15 tablet 11   valACYclovir (VALTREX) 1000 MG tablet Take 1 tablet (1,000 mg total) by mouth 3 (three) times daily. 21 tablet 0   vitamin B-12 1000 MCG tablet Take 1 tablet (1,000 mcg total) by mouth daily. 180 tablet 1   No current facility-administered medications for this visit.       REVIEW OF SYSTEMS (Negative unless checked)  Constitutional: [] Weight loss  [] Fever  [] Chills Cardiac: [] Chest pain   [] Chest pressure   [] Palpitations   [] Shortness of breath when laying flat   [] Shortness of breath at rest   [] Shortness of breath with exertion. Vascular:  [] Pain in legs with walking   [] Pain in legs at rest   [] Pain in legs when laying flat   [] Claudication   [] Pain in feet when walking  [] Pain in feet at rest  [] Pain in feet when laying flat   [] History of DVT   [] Phlebitis   [] Swelling in legs   [] Varicose veins   [] Non-healing ulcers Pulmonary:   [] Uses home oxygen   [] Productive cough   [] Hemoptysis   [] Wheeze  [] COPD   [] Asthma Neurologic:  [] Dizziness  [] Blackouts   [] Seizures   [] History of stroke   [] History of TIA  [] Aphasia   [] Temporary blindness   [] Dysphagia   [] Weakness or numbness in arms   [] Weakness or numbness in legs Musculoskeletal:  [x] Arthritis   [] Joint swelling   [] Joint pain   [x] Low back pain Hematologic:  [] Easy bruising   [] Easy bleeding   [] Hypercoagulable state   [] Anemic  [] Hepatitis Gastrointestinal:  [] Blood in stool   [] Vomiting blood  [x] Gastroesophageal reflux/heartburn   [] Abdominal pain Genitourinary:  [] Chronic kidney disease   [] Difficult urination  [] Frequent urination  [] Burning with urination   [] Hematuria Skin:  [] Rashes   [] Ulcers   [] Wounds Psychological:  [] History of anxiety   []  History of major depression.    Physical Exam BP 105/69 (BP Location: Right Arm)    Pulse 78  Resp 16    Ht 5' 11.5" (1.816 m)    Wt 203 lb (92.1 kg)    BMI 27.92 kg/m  Gen:  WD/WN, NAD. Appears younger than stated age. Head: /AT, No temporalis wasting.  Ear/Nose/Throat: Hearing grossly intact, nares w/o erythema or drainage, oropharynx w/o Erythema/Exudate Eyes: Conjunctiva clear, sclera non-icteric  Neck: trachea midline.  No JVD. No bruits Pulmonary:  Good air movement, respirations not labored, no use of accessory muscles  Cardiac: RRR, no JVD Vascular:  Vessel Right Left  Radial Palpable Palpable                                   Gastrointestinal:. No masses, surgical incisions, or scars. Musculoskeletal: M/S 5/5 throughout.  Extremities without ischemic changes.  No deformity or atrophy. No edema. Neurologic: Sensation grossly intact in extremities.  Symmetrical.  Speech is fluent. Motor exam as listed above. Psychiatric: Judgment intact, Mood & affect appropriate for pt's clinical situation. Dermatologic: No rashes or ulcers noted.  No cellulitis or open wounds.    Radiology Ct Chest Wo Contrast  Result Date: 02/15/2019 CLINICAL DATA:  Emphysema.  Follow-up nodule EXAM: CT CHEST WITHOUT CONTRAST TECHNIQUE: Multidetector CT imaging of the chest was performed following the standard protocol without IV contrast. COMPARISON:  12/04/2018 FINDINGS: Cardiovascular: Slight aneurysmal dilatation of the ascending thoracic aorta, 4 cm. Scattered aortic and coronary artery calcifications, best seen  in the left anterior descending coronary artery. Heart is upper limits normal in size. Mediastinum/Nodes: No mediastinal, hilar, or axillary adenopathy. Prominence of the thyroid gland again noted. Lungs/Pleura: Moderate to advanced centrilobular and paraseptal emphysema. Previously seen pleural-based density in the right upper lobe no longer measurable. This is compatible with a resolving infectious/inflammatory process. Biapical scarring. Small nodule along the right minor fissure in the right middle lobe on image 81 measures 4 mm, stable. Small nodules along the left fissure in the medial left upper lobe are stable, the largest 6 mm on image 75. These have however slowly enlarged over time, measuring 3-4 mm on study from 09/03/2018 and 2-3 mm on study from 04/19/2016. No effusions. Upper Abdomen: Hypodensities scattered throughout the liver most compatible with cysts. No acute findings. Musculoskeletal: Chest wall soft tissues are unremarkable. No acute bony abnormality. IMPRESSION: Advanced centrilobular and paraseptal emphysema. Previously seen subpleural masslike density in the right upper lobe nearly completely resolved with minimal residual scarring. Small subpleural right middle lobe nodule is stable. Small subpleural medial left upper lobe nodules are stable since recent study but have slowly enlarged in size since 2017. Recommend continued attention on follow-up imaging. These could be followed with repeat CT in 1 year. Coronary artery disease, aortic atherosclerosis. 4 cm ascending thoracic aortic aneurysm. Recommend annual imaging followup by CTA or MRA. This recommendation follows 2010 ACCF/AHA/AATS/ACR/ASA/SCA/SCAI/SIR/STS/SVM Guidelines for the Diagnosis and Management of Patients with Thoracic Aortic Disease. Circulation. 2010; 121: Z610-R604. Aortic aneurysm NOS (ICD10-I71.9) Electronically Signed   By: Rolm Baptise M.D.   On: 02/15/2019 11:43    Labs Recent Results (from the past 2160 hour(s))   CBC with Differential/Platelet     Status: Abnormal   Collection Time: 12/13/18 10:20 AM  Result Value Ref Range   WBC 6.2 4.0 - 10.5 K/uL   RBC 4.71 4.22 - 5.81 MIL/uL   Hemoglobin 14.4 13.0 - 17.0 g/dL   HCT 41.3 39.0 - 52.0 %   MCV 87.7 80.0 - 100.0 fL  MCH 30.6 26.0 - 34.0 pg   MCHC 34.9 30.0 - 36.0 g/dL   RDW 14.0 11.5 - 15.5 %   Platelets 109 (L) 150 - 400 K/uL    Comment: Immature Platelet Fraction may be clinically indicated, consider ordering this additional test [id number]    nRBC 0.0 0.0 - 0.2 %   Neutrophils Relative % 59 %   Neutro Abs 3.7 1.7 - 7.7 K/uL   Lymphocytes Relative 27 %   Lymphs Abs 1.7 0.7 - 4.0 K/uL   Monocytes Relative 9 %   Monocytes Absolute 0.6 0.1 - 1.0 K/uL   Eosinophils Relative 3 %   Eosinophils Absolute 0.2 0.0 - 0.5 K/uL   Basophils Relative 1 %   Basophils Absolute 0.0 0.0 - 0.1 K/uL   Immature Granulocytes 1 %   Abs Immature Granulocytes 0.04 0.00 - 0.07 K/uL    Comment: Performed at [facility], [address], [place], [place] [address]  AFP tumor marker     Status: None   Collection Time: 01/31/19  9:48 AM  Result Value Ref Range   AFP, Serum, Tumor Marker 3.6 0.0 - 8.3 ng/mL    Comment: [employer] Electrochemiluminescence Immunoassay (ECLIA) Values obtained with different assay methods or kits cannot be used interchangeably.  Results cannot be interpreted as absolute evidence of the presence or absence of malignant disease. This test is not interpretable in pregnant females.   Comprehensive metabolic panel     Status: Abnormal   Collection Time: 01/31/19  9:48 AM  Result Value Ref Range   Glucose 101 (H) 65 - 99 mg/dL   BUN 10 8 - 27 mg/dL   Creatinine, Ser 0.98 0.76 - 1.27 mg/dL   GFR calc non Af Amer 76 >59 mL/min/1.73   GFR calc Af Amer 88 >59 mL/min/1.73   BUN/Creatinine Ratio 10 10 - 24   Sodium 137 134 - 144 mmol/L   Potassium 4.3 3.5 - 5.2 mmol/L   Chloride 102 96 - 106 mmol/L   CO2 22 20 - 29  mmol/L   Calcium 9.5 8.6 - 10.2 mg/dL   Total Protein 7.0 6.0 - 8.5 g/dL   Albumin 4.5 3.7 - 4.7 g/dL   Globulin, Total 2.5 1.5 - 4.5 g/dL   Albumin/Globulin Ratio 1.8 1.2 - 2.2   Bilirubin Total 0.3 0.0 - 1.2 mg/dL   Alkaline Phosphatase 72 39 - 117 IU/L   AST 28 0 - 40 IU/L   ALT 26 0 - 44 IU/L  Protime-INR     Status: None   Collection Time: 01/31/19  9:48 AM  Result Value Ref Range   INR 1.0 0.8 - 1.2    Comment: Reference interval is for non-anticoagulated patients. Suggested INR therapeutic range for Vitamin K antagonist therapy:    Standard Dose (moderate intensity                   therapeutic range):       2.0 - 3.0    Higher intensity therapeutic range       2.5 - 3.5    Prothrombin Time 10.8 9.1 - 12.0 sec  Vitamin B12     Status: Abnormal   Collection Time: 02/12/19 10:22 AM  Result Value Ref Range   Vitamin B-12 1,357 (H) 180 - 914 pg/mL    Comment: (NOTE) This assay is not validated for testing neonatal or myeloproliferative syndrome specimens for Vitamin B12 levels. Performed at [facility], [place]  [address], [place], [place] [address]   Ferritin     Status: None   Collection Time: 02/12/19 10:22 AM  Result Value Ref Range   Ferritin 156 24 - 336 ng/mL    Comment: Performed at [facility], [address] [place], [place] [address]  CBC with Differential/Platelet     Status: Abnormal   Collection Time: 02/12/19 10:22 AM  Result Value Ref Range   WBC 5.7 4.0 - 10.5 K/uL   RBC 4.68 4.22 - 5.81 MIL/uL   Hemoglobin 14.7 13.0 - 17.0 g/dL   HCT 41.9 39.0 - 52.0 %   MCV 89.5 80.0 - 100.0 fL   MCH 31.4 26.0 - 34.0 pg   MCHC 35.1 30.0 - 36.0 g/dL   RDW 14.0 11.5 - 15.5 %   Platelets 96 (L) 150 - 400 K/uL    Comment: Immature Platelet Fraction may be clinically indicated, consider ordering this additional test [id number]    nRBC 0.0 0.0 - 0.2 %   Neutrophils Relative % 54 %   Neutro Abs 3.1 1.7 - 7.7 K/uL   Lymphocytes Relative 25 %    Lymphs Abs 1.4 0.7 - 4.0 K/uL   Monocytes Relative 14 %   Monocytes Absolute 0.8 0.1 - 1.0 K/uL   Eosinophils Relative 5 %   Eosinophils Absolute 0.3 0.0 - 0.5 K/uL   Basophils Relative 1 %   Basophils Absolute 0.0 0.0 - 0.1 K/uL   Immature Granulocytes 1 %   Abs Immature Granulocytes 0.03 0.00 - 0.07 K/uL    Comment: Performed at [facility], [address], [place], [place] [address]  H. pylori antigen, stool     Status: None   Collection Time: 02/26/19  9:53 AM  Result Value Ref Range   H. Pylori Stool Ag, Eia Negative Negative    Comment: (NOTE) Performed At: [facility] [place], [place] [id number] Rush Farmer MD TJ:[id number]     Assessment/Plan:  Essential hypertension Blood pressure control is of paramount importance in reducing enlargement of thoracic aortic aneurysms particularly in the ascending component.  His blood pressure control is excellent on 2 medications.  Thoracic aortic aneurysm [facility]) The patient has a 4.0 cm ascending thoracic aortic aneurysm.  This does not pose any immediate risk, but long-term surveillance is important for monitoring the aneurysm.  We discussed that repair would not be considered until at least 6 to 6-1/2 cm.  He is already getting CT scans every 6 months to follow-up on a pulmonary finding and sees a pulmonologist.  I will plan to see him back in 6 months after he gets his CT scan and we can review the results.      [name] 03/12/2019, 12:35 PM   This note was created with Dragon medical transcription system.  Any errors from dictation are unintentional.

## 2019-03-12 NOTE — Patient Instructions (Signed)
Thoracic Aortic Aneurysm  An aneurysm is a bulge in an artery. It happens when blood pushes up against a weakened or damaged artery wall. A thoracic aortic aneurysm is an aneurysm that occurs in the first part of the aorta, between the heart and the diaphragm. The aorta is the main artery of the body. It supplies blood from the heart to the rest of the body. Some aneurysms may not cause symptoms or problems. However, the major concern with a thoracic aortic aneurysm is that it can enlarge and burst (rupture), or blood can flow between the layers of the wall of the aorta through a tear (aorticdissection). Both of these conditions can cause bleeding inside the body and can be life threatening if they are not diagnosed and treated right away. What are the causes? The exact cause of this condition is not known. What increases the risk? The following factors may make you more likely to develop this condition:  Being 31 or older.  Having hardening of the arteries caused by the buildup of fat and other substances in the lining of a blood vessel (arteriosclerosis).  Having inflammation of the walls of an artery (arteritis).  Having a genetic disease that weakens the body's connective tissue, such as Marfan syndrome.  Having a family history of aneurysms.  Having an injury or trauma to the aorta.  Using tobacco.  Having high blood pressure (hypertension).  Having high cholesterol.  Having an infection that is caused by bacteria, such as syphilis or staphylococcus, in the wall of the aorta (infectious aortitis). What are the signs or symptoms? Symptoms of this condition vary depending on the size of the aneurysm and how fast it is growing. Most grow slowly and do not cause symptoms. When symptoms do occur, they may include:  Pain in the chest, back, sides, or abdomen. The pain may vary in intensity. Sudden, severe pain may indicate that the aneurysm has ruptured.  Hoarseness.  Cough.   Shortness of breath.  Swallowing problems.  Swelling in the face, arms, or legs.  Fever.  Unexplained weight loss. How is this diagnosed? This condition may be diagnosed with:  An ultrasound.  X-rays.  A CT scan.  An MRI.  Tests to check the arteries for damage or blockages (angiogram). Most unruptured thoracic aortic aneurysms cause no symptoms, so they are often found during exams for other conditions. How is this treated? Treatment for this condition depends on:  The size of the aneurysm.  How fast the aneurysm is growing.  Your age.  Risk factors for rupture. Small aneurysms (2.2 inches, or 5.5 cm, or less) may be managed with:  Medicines to: ? Control blood pressure. ? Manage pain. ? Fight infection.  Regular monitoring. This may include an ultrasound or CT scan every year or every 6 months to see if the aneurysm is getting bigger. Large or fast-growing aneurysms may be treated with surgery. Follow these instructions at home: Eating and drinking  Eat a healthy diet. Your health care provider may recommend that you: ? Lower your salt (sodium) intake. In some people, too much salt can raise blood pressure and increase the risk for thoracic aortic aneurysm. ? Avoid foods that are high in saturated fat and cholesterol, such as red meat and dairy. ? Eat a diet that is low in sugar. ? Increase your fiber intake by including whole grains, vegetables, and fruits in your diet. Eating these foods may help to lower your blood pressure.  Limit or avoid alcohol  as recommended by your health care provider. Lifestyle  Do not use any products that contain nicotine or tobacco, such as cigarettes and e-cigarettes. If you need help quitting, ask your health care provider.  Maintain a healthy weight.  Check your blood pressure regularly. Follow your health care provider's instructions on how to keep your blood pressure within normal limits.  Have your blood sugar (glucose)  level and cholesterol levels checked regularly. Follow your health care provider's instructions on how to keep levels within normal limits. Activity   Stay physically active and exercise regularly. Talk with your health care provider about how often you should exercise and ask which types of exercise are best for you.  Avoid heavy lifting and activities that take a lot of effort. Ask your health care provider what activities are safe for you. General instructions  Take over-the-counter and prescription medicines only as told by your health care provider.  Talk with your health care provider about regular screenings to see if the aneurysm is getting bigger.  Keep all follow-up visits as told by your health care provider. This is important. Contact a health care provider if you have:  Unexplained weight loss. Get help right away if you have:  Pain in your upper back, neck, or abdomen. This pain may move into your chest and arms.  Trouble swallowing.  A cough or hoarseness.  Shortness of breath. Summary  A thoracic aortic aneurysm is an aneurysm that occurs in the first part of the aorta, between the heart and the diaphragm.  As a thoracic aortic aneurysm becomes larger, it can burst (rupture), or blood can flow between the layers of the wall of the aorta through a tear (aorticdissection). These conditions can be life threatening if they are not diagnosed and treated right away.  If you have a thoracic aortic aneurysm, its growth will be closely monitored. Surgical repair may be needed for larger or faster-growing aneurysms. This information is not intended to replace advice given to you by your health care provider. Make sure you discuss any questions you have with your health care provider. Document Released: 09/05/2005 Document Revised: 10/17/2017 Document Reviewed: 10/17/2017 Elsevier Interactive Patient Education  2019 [employer].

## 2019-03-12 NOTE — Assessment & Plan Note (Signed)
The patient has a 4.0 cm ascending thoracic aortic aneurysm.  This does not pose any immediate risk, but long-term surveillance is important for monitoring the aneurysm.  We discussed that repair would not be considered until at least 6 to 6-1/2 cm.  He is already getting CT scans every 6 months to follow-up on a pulmonary finding and sees a pulmonologist.  I will plan to see him back in 6 months after he gets his CT scan and we can review the results.

## 2019-03-27 ENCOUNTER — Ambulatory Visit (INDEPENDENT_AMBULATORY_CARE_PROVIDER_SITE_OTHER): Payer: Medicare Other | Admitting: Family Medicine

## 2019-03-27 ENCOUNTER — Other Ambulatory Visit: Payer: Self-pay

## 2019-03-27 ENCOUNTER — Encounter: Payer: Self-pay | Admitting: Family Medicine

## 2019-03-27 VITALS — BP 107/72 | HR 82 | Temp 98.3°F | Resp 16 | Wt 209.6 lb

## 2019-03-27 DIAGNOSIS — K7460 Unspecified cirrhosis of liver: Secondary | ICD-10-CM

## 2019-03-27 DIAGNOSIS — E538 Deficiency of other specified B group vitamins: Secondary | ICD-10-CM

## 2019-03-27 DIAGNOSIS — R300 Dysuria: Secondary | ICD-10-CM | POA: Diagnosis not present

## 2019-03-27 DIAGNOSIS — R319 Hematuria, unspecified: Secondary | ICD-10-CM | POA: Diagnosis not present

## 2019-03-27 DIAGNOSIS — Z23 Encounter for immunization: Secondary | ICD-10-CM | POA: Diagnosis not present

## 2019-03-27 DIAGNOSIS — I10 Essential (primary) hypertension: Secondary | ICD-10-CM

## 2019-03-27 LAB — POCT URINALYSIS DIPSTICK
Blood, UA: NEGATIVE
Glucose, UA: NEGATIVE
Ketones, UA: NEGATIVE
Leukocytes, UA: NEGATIVE
Nitrite, UA: NEGATIVE
Protein, UA: POSITIVE — AB
Spec Grav, UA: 1.025 (ref 1.010–1.025)
Urobilinogen, UA: 1 E.U./dL
pH, UA: 5 (ref 5.0–8.0)

## 2019-03-27 NOTE — Progress Notes (Signed)
Patient: Dwayne Thompson Male    DOB: 08/25/1946   73 y.o.   MRN: [id number] Visit Date: 03/27/2019  Today's Provider: [name], MD   Chief Complaint  Patient presents with  . Gastroesophageal Reflux   Subjective:     HPI  GERD, Follow up:  The patient was last seen for GERD 6 months ago. Changes made since that visit include .  He reports excellent compliance with treatment. He is not having side effects. Marland Kitchen  He IS experiencing heartburn. He is NOT experiencing abdominal bloating, belching, belching and eructation, bilious reflux, chest pain, choking on food, cough, deep pressure at base of neck, difficulty swallowing, dysphagia, early satiety, fullness after meals, heartburn, hematemesis, hoarseness, laryngitis, melena, midespigastric pain, nausea, need to clear throat frequently, nocturnal burning, odynophagia, regurgitation of undigested food, shortness of breath, symptoms primarily relate to meals, and lying down after meals, unexpected weight loss, upper abdominal discomfort, waterbrash or wheezing  He had hematuria for 2 days in May with mild dysuria since then. ------------------------------------------------------------------------   Allergies  Allergen Reactions  . Tamsulosin Hives  . Other Rash  . Tamsulosin Hcl Hives  . Sulfa Antibiotics Rash     Current Outpatient Medications:  .  aspirin EC 81 MG EC tablet, Take 1 tablet (81 mg total) by mouth daily., Disp: 180 tablet, Rfl: 0 .  cetirizine (ZYRTEC) 10 MG tablet, Take 1 tablet (10 mg total) by mouth daily., Disp: 90 tablet, Rfl: 4 .  Cholecalciferol 1000 UNITS tablet, Take 1,000 Units by mouth daily. Reported on 03/03/2016, Disp: , Rfl:  .  cyclobenzaprine (FLEXERIL) 10 MG tablet, Take one tablet at night and then take 1 tablet twice a day PRN, Disp: 90 tablet, Rfl: 1 .  doxazosin (CARDURA) 4 MG tablet, Take 1 tablet (4 mg total) by mouth daily., Disp: 90 tablet, Rfl: 3 .  folic acid (FOLVITE) 1 MG  tablet, Take 1 tablet (1 mg total) by mouth daily., Disp: 30 tablet, Rfl: 0 .  HYDROcodone-acetaminophen (NORCO/VICODIN) 5-325 MG tablet, hydrocodone 5 mg-acetaminophen 325 mg tablet  Take 1-2 every 6-8 hours if needed for pain, Disp: , Rfl:  .  lisinopril (PRINIVIL,ZESTRIL) 10 MG tablet, Take 1 tablet (10 mg total) by mouth daily., Disp: 90 tablet, Rfl: 3 .  montelukast (SINGULAIR) 10 MG tablet, Take 1 tablet (10 mg total) by mouth daily., Disp: 90 tablet, Rfl: 0 .  oxybutynin (DITROPAN-XL) 10 MG 24 hr tablet, Take 1 tablet (10 mg total) by mouth daily., Disp: 30 tablet, Rfl: 11 .  pantoprazole (PROTONIX) 20 MG tablet, Take 1 tablet (20 mg total) by mouth 2 (two) times daily., Disp: 60 tablet, Rfl: 3 .  potassium chloride (K-DUR,KLOR-CON) 10 MEQ tablet, Take 1 tablet (10 mEq total) by mouth 2 (two) times daily., Disp: 180 tablet, Rfl: 3 .  rosuvastatin (CRESTOR) 10 MG tablet, Take 1 tablet (10 mg total) by mouth daily., Disp: 90 tablet, Rfl: 3 .  tadalafil (ADCIRCA/CIALIS) 20 MG tablet, Take 1 tablet (20 mg total) by mouth daily as needed for erectile dysfunction., Disp: 15 tablet, Rfl: 11 .  vitamin B-12 1000 MCG tablet, Take 1 tablet (1,000 mcg total) by mouth daily., Disp: 180 tablet, Rfl: 1 .  acetaminophen (TYLENOL) 325 MG tablet, Take 2 tablets (650 mg total) by mouth 3 (three) times daily. (Patient not taking: Reported on 03/27/2019), Disp: 180 tablet, Rfl: 0 .  valACYclovir (VALTREX) 1000 MG tablet, Take 1 tablet (1,000 mg total) by  mouth 3 (three) times daily. (Patient not taking: Reported on 03/27/2019), Disp: 21 tablet, Rfl: 0  Review of Systems  Constitutional: Negative.   HENT: Negative.   Eyes: Negative.   Respiratory: Negative.   Gastrointestinal: Negative.   Endocrine: Negative.   Genitourinary: Positive for enuresis.  Allergic/Immunologic: Negative.   Psychiatric/Behavioral: Negative.     Social History   Tobacco Use  . Smoking status: Former Smoker    Packs/day: 1.00     Years: 50.00    Pack years: 50.00    Types: Cigarettes    Quit date: 10/20/2013    Years since quitting: 5.4  . Smokeless tobacco: Never Used  Substance Use Topics  . Alcohol use: Yes    Alcohol/week: 21.0 - 28.0 standard drinks    Types: 21 - 28 Shots of liquor per week    Comment: 3-4 gin and sodas a day      Objective:   BP 107/72   Pulse 82   Temp 98.3 F (36.8 C) (Oral)   Resp 16   Wt 209 lb 9.6 oz (95.1 kg)   BMI 28.83 kg/m  Vitals:   03/27/19 1022  BP: 107/72  Pulse: 82  Resp: 16  Temp: 98.3 F (36.8 C)  TempSrc: Oral  Weight: 209 lb 9.6 oz (95.1 kg)     Physical Exam Vitals signs reviewed.  Constitutional:      Appearance: He is well-developed.  HENT:     Head: Normocephalic and atraumatic.  Eyes:     General: No scleral icterus.    Conjunctiva/sclera: Conjunctivae normal.  Neck:     Thyroid: No thyromegaly.  Cardiovascular:     Rate and Rhythm: Normal rate and regular rhythm.     Heart sounds: Normal heart sounds.  Pulmonary:     Effort: Pulmonary effort is normal.     Breath sounds: Normal breath sounds.  Abdominal:     Palpations: Abdomen is soft.  Skin:    General: Skin is warm and dry.  Neurological:     Mental Status: He is alert and oriented to person, place, and time.  Psychiatric:        Behavior: Behavior normal.        Thought Content: Thought content normal.        Judgment: Judgment normal.      No results found for any visits on 03/27/19.     Assessment & Plan    1. Hepatic cirrhosis, unspecified hepatic cirrhosis type, unspecified whether ascites present ([place]) Pt advised to quit all alcohol. Hep A and B vaccine started. - Hepatitis A vaccine adult IM - Hepatitis B vaccine adult IM  2. Dysuria  - POCT urinalysis dipstick - Urine Culture - Ambulatory referral to Urology  3. Hematuria, unspecified type BPH - Ambulatory referral to Urology  4. B12 deficiency      [name], MD  [facility]

## 2019-03-29 LAB — URINE CULTURE

## 2019-04-04 ENCOUNTER — Other Ambulatory Visit: Payer: Self-pay

## 2019-04-04 ENCOUNTER — Ambulatory Visit (INDEPENDENT_AMBULATORY_CARE_PROVIDER_SITE_OTHER): Payer: Medicare Other

## 2019-04-04 VITALS — BP 131/85 | HR 85

## 2019-04-04 DIAGNOSIS — R350 Frequency of micturition: Secondary | ICD-10-CM | POA: Diagnosis not present

## 2019-04-04 LAB — URINALYSIS, COMPLETE
Bilirubin, UA: NEGATIVE
Glucose, UA: NEGATIVE
Ketones, UA: NEGATIVE
Leukocytes,UA: NEGATIVE
Nitrite, UA: NEGATIVE
Protein,UA: NEGATIVE
RBC, UA: NEGATIVE
Specific Gravity, UA: 1.02 (ref 1.005–1.030)
Urobilinogen, Ur: 1 mg/dL (ref 0.2–1.0)
pH, UA: 5.5 (ref 5.0–7.5)

## 2019-04-04 LAB — MICROSCOPIC EXAMINATION
Bacteria, UA: NONE SEEN
RBC, Urine: NONE SEEN /hpf (ref 0–2)

## 2019-04-04 NOTE — Progress Notes (Signed)
Patient present today for a urine drop off complaining of urinary frequency and pain with straining to urinate. Symptoms started a month ago patient stated that when symptoms started he had two days of gross hematuria but none since. UA done at Dr. Alben Spittle office culture was negative and patient was told to follow up here with urology. UA and culture ordered today. Patient also instructed to make an appointment to follow up with Dr. Versie Starks due to gross hematuria and urinary symptoms

## 2019-04-05 ENCOUNTER — Telehealth: Payer: Self-pay | Admitting: Pulmonary Disease

## 2019-04-05 MED ORDER — MONTELUKAST SODIUM 10 MG PO TABS: 10.0000 mg | ORAL_TABLET | Freq: Every day | ORAL | 0 refills | Status: DC

## 2019-04-05 NOTE — Telephone Encounter (Signed)
Called and spoke to pt's spouse, [name].  [name] is requesting 90 day supply of singulair to mail order and 30 days to local pharmacy. Rx for signulair has been sent to preferred pharmacy.  Nothing further is needed.

## 2019-04-07 LAB — CULTURE, URINE COMPREHENSIVE

## 2019-04-08 ENCOUNTER — Encounter: Payer: Self-pay | Admitting: Urology

## 2019-04-08 ENCOUNTER — Other Ambulatory Visit: Payer: Self-pay

## 2019-04-08 ENCOUNTER — Ambulatory Visit (INDEPENDENT_AMBULATORY_CARE_PROVIDER_SITE_OTHER): Payer: Medicare Other | Admitting: Urology

## 2019-04-08 VITALS — BP 109/71 | HR 90 | Ht 71.5 in | Wt 208.0 lb

## 2019-04-08 DIAGNOSIS — N529 Male erectile dysfunction, unspecified: Secondary | ICD-10-CM | POA: Diagnosis not present

## 2019-04-08 DIAGNOSIS — R350 Frequency of micturition: Secondary | ICD-10-CM | POA: Diagnosis not present

## 2019-04-08 DIAGNOSIS — N401 Enlarged prostate with lower urinary tract symptoms: Secondary | ICD-10-CM | POA: Diagnosis not present

## 2019-04-08 LAB — BLADDER SCAN AMB NON-IMAGING: Scan Result: 54

## 2019-04-08 MED ORDER — AMOXICILLIN-POT CLAVULANATE 875-125 MG PO TABS: 1.0000 | ORAL_TABLET | Freq: Two times a day (BID) | ORAL | 0 refills | Status: DC

## 2019-04-08 MED ORDER — AMBULATORY NON FORMULARY MEDICATION: 0 refills | Status: DC

## 2019-04-08 NOTE — Progress Notes (Signed)
   04/08/2019 4:20 PM   Dwayne Thompson 1946-02-22 [id number]  Reason for visit: urinary symptoms  HPI: I saw Dwayne Thompson as an add-on in clinic today for possible UTI.  He is a 73 year old male I have followed previously for BPH/OAB with an enlarged 90 cc prostate.  His PVRs have all been low, and continues to be normal today at 50 cc.  He reports a few weeks of intermittent dysuria and some pelvic pain.  Urinalysis on 7/16 was not particularly concerning, however urine culture ultimately grew Proteus.  He had one episode of symptomatic hematuria when straining with urination.  His past medical history is notable for alcoholic cirrhosis, and he continues to drink 4-5 alcoholic drinks a day which likely contribute to his urinary frequency and urgency.  We have discussed behavioral modifications at length previously.  He also has erectile dysfunction that is refractory to both Viagra and Cialis, and he is interested in other alternatives for his ED.   ROS: Please see flowsheet from today's date for complete review of systems.  Physical Exam: BP 109/71 (BP Location: Left Arm, Patient Position: Sitting, Cuff Size: Normal)   Pulse 90   Ht 5' 11.5" (1.816 m)   Wt 208 lb (94.3 kg)   BMI 28.61 kg/m    Laboratory Data: Urine culture 7/16 Proteus 10-25K  Assessment & Plan:   In summary, he is a 73 year old comorbid male with alcoholic cirrhosis who continues to drink 4-5 alcoholic beverages per day with baseline mixed urinary symptoms of urgency and frequency, nocturia 3-4 times per night, and intermittent stream.  His urinalysis from last week that he dropped off when he had worsening urinary frequency and pelvic pain did ultimately show a Proteus UTI.  He is also interested in a trial of penile injections for his ED.  We discussed the risks and benefits of this at length.  -Augmentin twice daily x10 days for UTI -Trimix trial prescription sent, follow-up with [name], PA in 1 to 2 weeks  for teaching -Keep scheduled follow-up in 6 months for discussion of BPH/OAB -Would pursue urodynamics prior to any consideration of surgical intervention for this comorbid patient  A total of 15 minutes were spent face-to-face with the patient, greater than 50% was spent in patient education, counseling, and coordination of care regarding UTI, BPH/OAB, and erectile dysfunction.   Billey Co, [facility] [address] [place], [place] [address] [phone]

## 2019-04-11 ENCOUNTER — Other Ambulatory Visit: Payer: Self-pay | Admitting: Family Medicine

## 2019-04-18 ENCOUNTER — Telehealth: Payer: Self-pay | Admitting: Family Medicine

## 2019-04-18 NOTE — Telephone Encounter (Signed)
Left a message with patient's wife to have him contact our office to confirm patient picks up Trimix before appointment day.

## 2019-04-18 NOTE — Telephone Encounter (Signed)
Spoke to patient and he is aware that he is to pick up Trimix at [facility] within the 3 day span and bring it to his appointment.

## 2019-04-23 ENCOUNTER — Ambulatory Visit (INDEPENDENT_AMBULATORY_CARE_PROVIDER_SITE_OTHER): Payer: Medicare Other

## 2019-04-23 ENCOUNTER — Other Ambulatory Visit: Payer: Self-pay

## 2019-04-23 ENCOUNTER — Ambulatory Visit (INDEPENDENT_AMBULATORY_CARE_PROVIDER_SITE_OTHER): Payer: Medicare Other | Admitting: Family Medicine

## 2019-04-23 VITALS — BP 118/68 | HR 84 | Temp 98.8°F | Ht 72.0 in | Wt 209.4 lb

## 2019-04-23 VITALS — BP 118/68 | HR 84 | Temp 98.8°F | Resp 16 | Ht 72.0 in | Wt 209.0 lb

## 2019-04-23 DIAGNOSIS — D696 Thrombocytopenia, unspecified: Secondary | ICD-10-CM

## 2019-04-23 DIAGNOSIS — N529 Male erectile dysfunction, unspecified: Secondary | ICD-10-CM

## 2019-04-23 DIAGNOSIS — Z0000 Encounter for general adult medical examination without abnormal findings: Secondary | ICD-10-CM

## 2019-04-23 DIAGNOSIS — K7460 Unspecified cirrhosis of liver: Secondary | ICD-10-CM | POA: Diagnosis not present

## 2019-04-23 NOTE — Patient Instructions (Signed)
Dwayne Thompson , Thank you for taking time to come for your Medicare Wellness Visit. I appreciate your ongoing commitment to your health goals. Please review the following plan we discussed and let me know if I can assist you in the future.   Screening recommendations/referrals: Colonoscopy: Up to date, due 03/2023 Recommended yearly ophthalmology/optometry visit for glaucoma screening and checkup Recommended yearly dental visit for hygiene and checkup  Vaccinations: Influenza vaccine: Up to date Pneumococcal vaccine: Completed series Tdap vaccine: Pt declines today.  Shingles vaccine: Pt declines today.     Advanced directives: Advance directive discussed with you today. Even though you declined this today please call our office should you change your mind and we can give you the proper paperwork for you to fill out.  Conditions/risks identified: Recommend to cut back on alcohol intake by half and only consuming 1-2 glasses a day instead of 3-4.   Next appointment: 9:40 AM today with Dr Rosanna Randy.   Preventive Care 60 Years and Older, Male Preventive care refers to lifestyle choices and visits with your health care provider that can promote health and wellness. What does preventive care include?  A yearly physical exam. This is also called an annual well check.  Dental exams once or twice a year.  Routine eye exams. Ask your health care provider how often you should have your eyes checked.  Personal lifestyle choices, including:  Daily care of your teeth and gums.  Regular physical activity.  Eating a healthy diet.  Avoiding tobacco and drug use.  Limiting alcohol use.  Practicing safe sex.  Taking low doses of aspirin every day.  Taking vitamin and mineral supplements as recommended by your health care provider. What happens during an annual well check? The services and screenings done by your health care provider during your annual well check will depend on your age,  overall health, lifestyle risk factors, and family history of disease. Counseling  Your health care provider may ask you questions about your:  Alcohol use.  Tobacco use.  Drug use.  Emotional well-being.  Home and relationship well-being.  Sexual activity.  Eating habits.  History of falls.  Memory and ability to understand (cognition).  Work and work Statistician. Screening  You may have the following tests or measurements:  Height, weight, and BMI.  Blood pressure.  Lipid and cholesterol levels. These may be checked every 5 years, or more frequently if you are over 36 years old.  Skin check.  Lung cancer screening. You may have this screening every year starting at age 55 if you have a 30-pack-year history of smoking and currently smoke or have quit within the past 15 years.  Fecal occult blood test (FOBT) of the stool. You may have this test every year starting at age 53.  Flexible sigmoidoscopy or colonoscopy. You may have a sigmoidoscopy every 5 years or a colonoscopy every 10 years starting at age 54.  Prostate cancer screening. Recommendations will vary depending on your family history and other risks.  Hepatitis C blood test.  Hepatitis B blood test.  Sexually transmitted disease (STD) testing.  Diabetes screening. This is done by checking your blood sugar (glucose) after you have not eaten for a while (fasting). You may have this done every 1-3 years.  Abdominal aortic aneurysm (AAA) screening. You may need this if you are a current or former smoker.  Osteoporosis. You may be screened starting at age 23 if you are at high risk. Talk with your health care  provider about your test results, treatment options, and if necessary, the need for more tests. Vaccines  Your health care provider may recommend certain vaccines, such as:  Influenza vaccine. This is recommended every year.  Tetanus, diphtheria, and acellular pertussis (Tdap, Td) vaccine. You may  need a Td booster every 10 years.  Zoster vaccine. You may need this after age 45.  Pneumococcal 13-valent conjugate (PCV13) vaccine. One dose is recommended after age 25.  Pneumococcal polysaccharide (PPSV23) vaccine. One dose is recommended after age 69. Talk to your health care provider about which screenings and vaccines you need and how often you need them. This information is not intended to replace advice given to you by your health care provider. Make sure you discuss any questions you have with your health care provider. Document Released: 10/02/2015 Document Revised: 05/25/2016 Document Reviewed: 07/07/2015 Elsevier Interactive Patient Education  2017 [place] Prevention in the Home Falls can cause injuries. They can happen to people of all ages. There are many things you can do to make your home safe and to help prevent falls. What can I do on the outside of my home?  Regularly fix the edges of walkways and driveways and fix any cracks.  Remove anything that might make you trip as you walk through a door, such as a raised step or threshold.  Trim any bushes or trees on the path to your home.  Use bright outdoor lighting.  Clear any walking paths of anything that might make someone trip, such as rocks or tools.  Regularly check to see if handrails are loose or broken. Make sure that both sides of any steps have handrails.  Any raised decks and porches should have guardrails on the edges.  Have any leaves, snow, or ice cleared regularly.  Use sand or salt on walking paths during winter.  Clean up any spills in your garage right away. This includes oil or grease spills. What can I do in the bathroom?  Use night lights.  Install grab bars by the toilet and in the tub and shower. Do not use towel bars as grab bars.  Use non-skid mats or decals in the tub or shower.  If you need to sit down in the shower, use a plastic, non-slip stool.  Keep the floor  dry. Clean up any water that spills on the floor as soon as it happens.  Remove soap buildup in the tub or shower regularly.  Attach bath mats securely with double-sided non-slip rug tape.  Do not have throw rugs and other things on the floor that can make you trip. What can I do in the bedroom?  Use night lights.  Make sure that you have a light by your bed that is easy to reach.  Do not use any sheets or blankets that are too big for your bed. They should not hang down onto the floor.  Have a firm chair that has side arms. You can use this for support while you get dressed.  Do not have throw rugs and other things on the floor that can make you trip. What can I do in the kitchen?  Clean up any spills right away.  Avoid walking on wet floors.  Keep items that you use a lot in easy-to-reach places.  If you need to reach something above you, use a strong step stool that has a grab bar.  Keep electrical cords out of the way.  Do not use floor polish  or wax that makes floors slippery. If you must use wax, use non-skid floor wax.  Do not have throw rugs and other things on the floor that can make you trip. What can I do with my stairs?  Do not leave any items on the stairs.  Make sure that there are handrails on both sides of the stairs and use them. Fix handrails that are broken or loose. Make sure that handrails are as long as the stairways.  Check any carpeting to make sure that it is firmly attached to the stairs. Fix any carpet that is loose or worn.  Avoid having throw rugs at the top or bottom of the stairs. If you do have throw rugs, attach them to the floor with carpet tape.  Make sure that you have a light switch at the top of the stairs and the bottom of the stairs. If you do not have them, ask someone to add them for you. What else can I do to help prevent falls?  Wear shoes that:  Do not have high heels.  Have rubber bottoms.  Are comfortable and fit you  well.  Are closed at the toe. Do not wear sandals.  If you use a stepladder:  Make sure that it is fully opened. Do not climb a closed stepladder.  Make sure that both sides of the stepladder are locked into place.  Ask someone to hold it for you, if possible.  Clearly mark and make sure that you can see:  Any grab bars or handrails.  First and last steps.  Where the edge of each step is.  Use tools that help you move around (mobility aids) if they are needed. These include:  Canes.  Walkers.  Scooters.  Crutches.  Turn on the lights when you go into a dark area. Replace any light bulbs as soon as they burn out.  Set up your furniture so you have a clear path. Avoid moving your furniture around.  If any of your floors are uneven, fix them.  If there are any pets around you, be aware of where they are.  Review your medicines with your doctor. Some medicines can make you feel dizzy. This can increase your chance of falling. Ask your doctor what other things that you can do to help prevent falls. This information is not intended to replace advice given to you by your health care provider. Make sure you discuss any questions you have with your health care provider. Document Released: 07/02/2009 Document Revised: 02/11/2016 Document Reviewed: 10/10/2014 Elsevier Interactive Patient Education  2017 [employer].

## 2019-04-23 NOTE — Progress Notes (Signed)
Patient: Dwayne Thompson, Male    DOB: 1945/12/16, 73 y.o.   MRN: [id number] Visit Date: 04/23/2019  Today's Provider: [name], MD   Chief Complaint  Patient presents with  . Annual Exam   Subjective:   Patient had AWV today.    Annual physical exam Dwayne Thompson is a 73 y.o. male who presents today for health maintenance and complete physical. He feels well. He reports exercising not regularly, but he does stay active. He reports he is sleeping well.  Colonoscopy- 04/05/2018. Repeat in 5 years. Dr. Larey Dresser.    Review of Systems  Constitutional: Negative.   HENT: Negative.   Eyes: Negative.   Respiratory: Negative.   Cardiovascular: Negative.   Gastrointestinal: Negative.   Endocrine: Negative.   Genitourinary: Negative.   Musculoskeletal: Negative.   Skin: Negative.   Allergic/Immunologic: Negative.   Neurological: Negative.   Hematological: Negative.   Psychiatric/Behavioral: Negative.     Social History      He  reports that he quit smoking about 5 years ago. His smoking use included cigarettes. He has a 50.00 pack-year smoking history. He has never used smokeless tobacco. He reports current alcohol use of about 21.0 - 28.0 standard drinks of alcohol per week. He reports that he does not use drugs.       Social History   Socioeconomic History  . Marital status: Married    Spouse name: [name]  . Number of children: 3  . Years of education: 69  . Highest education level: 12th grade  Occupational History  . Occupation: retired  Scientific laboratory technician  . Financial resource strain: Not hard at all  . Food insecurity    Worry: Never true    Inability: Never true  . Transportation needs    Medical: No    Non-medical: No  Tobacco Use  . Smoking status: Former Smoker    Packs/day: 1.00    Years: 50.00    Pack years: 50.00    Types: Cigarettes    Quit date: 10/20/2013    Years since quitting: 5.5  . Smokeless tobacco: Never Used  Substance and Sexual  Activity  . Alcohol use: Yes    Alcohol/week: 21.0 - 28.0 standard drinks    Types: 21 - 28 Shots of liquor per week    Comment: 3-4 gin and sodas a day  . Drug use: No  . Sexual activity: Not Currently  Lifestyle  . Physical activity    Days per week: 0 days    Minutes per session: 0 min  . Stress: Not at all  Relationships  . Social Herbalist on phone: Patient refused    Gets together: Patient refused    Attends religious service: Patient refused    Active member of club or organization: Patient refused    Attends meetings of clubs or organizations: Patient refused    Relationship status: Patient refused  Other Topics Concern  . Not on file  Social History Narrative  . Not on file    Past Medical History:  Diagnosis Date  . Anemia   . Back pain   . BPH (benign prostatic hyperplasia)   . ED (erectile dysfunction)   . Hyperlipidemia      Patient Active Problem List   Diagnosis Date Noted  . Essential hypertension 03/12/2019  . Thoracic aortic aneurysm ([place]) 03/12/2019  . Abnormal chest CT 02/13/2019  . Difficulty walking 12/06/2018  . Muscle weakness  12/06/2018  . Cellulitis of lower leg 09/17/2018  . CLE (columnar lined esophagus)   . Special screening for malignant neoplasms, colon   . Benign neoplasm of ascending colon   . Polyp of sigmoid colon   . Diverticulosis of large intestine without diverticulitis   . Iron deficiency anemia due to chronic blood loss 02/18/2018  . Folate deficiency 02/14/2018  . B12 deficiency 01/10/2018  . Folic acid deficiency 93/79/0240  . 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 Helicobacter pylori gastritis (chronic gastritis) 01/10/2018  . Thrombocytopenia ([place]) 01/10/2018  . Internal hemorrhoids   . Acute gastrointestinal hemorrhage   . Stomach irritation   . Duodenal nodule   . Melena 01/01/2018  . Neck pain on right side 03/23/2016  . History of tobacco abuse 03/23/2016  .  Cervical lymphadenitis 03/23/2016  . 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 Allergic rhinitis 03/28/2015  . 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 Failure of erection 03/28/2015  . Acid reflux 03/28/2015  . Arthritis, degenerative 03/28/2015  . Change in blood platelet count 03/28/2015  . Temporary cerebral vascular dysfunction 03/28/2015  . Personal history of tobacco use, presenting hazards to health 03/28/2015    Past Surgical History:  Procedure Laterality Date  . COLONOSCOPY Left 01/03/2018   Procedure: COLONOSCOPY;  Surgeon: Virgel Manifold, MD;  Location[facility] ENDOSCOPY;  Service: Endoscopy;  Laterality: Left;  . COLONOSCOPY WITH PROPOFOL N/A 04/05/2018   Procedure: COLONOSCOPY WITH PROPOFOL;  Surgeon: Virgel Manifold, MD;  Location: [facility] ENDOSCOPY;  Service: Endoscopy;  Laterality: N/A;  . ENTEROSCOPY  01/03/2018   Procedure: ENTEROSCOPY;  Surgeon: Virgel Manifold, MD;  Location: [facility] ENDOSCOPY;  Service: Endoscopy;;  . ESOPHAGOGASTRODUODENOSCOPY N/A 01/02/2018   Procedure: ESOPHAGOGASTRODUODENOSCOPY (EGD);  Surgeon: Virgel Manifold, MD;  Location: [facility] ENDOSCOPY;  Service: Endoscopy;  Laterality: N/A;  . ESOPHAGOGASTRODUODENOSCOPY (EGD) WITH PROPOFOL N/A 04/05/2018   Procedure: ESOPHAGOGASTRODUODENOSCOPY (EGD) WITH PROPOFOL;  Surgeon: Virgel Manifold, MD;  Location: [facility] ENDOSCOPY;  Service: Endoscopy;  Laterality: N/A;  . HERNIA REPAIR     1980's    Family History        Family Status  Relation Name Status  . Mother  Deceased  . Sister  Deceased at age 48       sarcoidosis  . Brother  Deceased  . Brother  Alive  . Father  Deceased  . Brother  Alive  . Brother  Deceased  . Daughter  Alive  . Daughter  Alive  . Daughter  Alive  . Brother  Alive  . Neg Hx  (Not Specified)        His family history includes  Alzheimer's disease in his mother; Anxiety disorder in his brother; Sarcoidosis in his sister. There is no history of Kidney disease or Prostate cancer.      Allergies  Allergen Reactions  . Tamsulosin Hives  . Other Rash  . Tamsulosin Hcl Hives  . Sulfa Antibiotics Rash     Current Outpatient Medications:  .  acetaminophen (TYLENOL) 325 MG tablet, Take 2 tablets (650 mg total) by mouth 3 (three) times daily., Disp: 180 tablet, Rfl: 0 .  AMBULATORY NON FORMULARY MEDICATION, Trimix (30/1/10)-(Pap/Phent/PGE) test dose   Vial 99ml  Qty 1 no refills  [place] [phone] Fax [phone] (Patient taking differently: daily. Trimix (30/1/10)-(Pap/Phent/PGE) test dose   Vial 58ml  Qty 1 no refills  [place] Fax [phone]), Disp: 1 vial, Rfl: 0 .  amoxicillin-clavulanate (AUGMENTIN) 875-125 MG tablet, Take 1 tablet by mouth every 12 (twelve) hours. (Patient not taking: Reported on 04/23/2019), Disp: 20 tablet, Rfl: 0 .  aspirin EC 81 MG EC tablet, Take 1 tablet (81 mg total) by mouth daily., Disp: 180 tablet, Rfl: 0 .  cetirizine (ZYRTEC) 10 MG tablet, Take 1 tablet (10 mg total) by mouth daily., Disp: 90 tablet, Rfl: 4 .  Cholecalciferol 1000 UNITS tablet, Take 1,000 Units by mouth daily. Reported on 03/03/2016, Disp: , Rfl:  .  cyclobenzaprine (FLEXERIL) 10 MG tablet, Take one tablet at night and then take 1 tablet twice a day PRN, Disp: 90 tablet, Rfl: 1 .  doxazosin (CARDURA) 4 MG tablet, TAKE 1 TABLET DAILY, Disp: 90 tablet, Rfl: 3 .  folic acid (FOLVITE) 1 MG tablet, Take 1 tablet (1 mg total) by mouth daily., Disp: 30 tablet, Rfl: 0 .  HYDROcodone-acetaminophen (NORCO/VICODIN) 5-325 MG tablet, hydrocodone 5 mg-acetaminophen 325 mg tablet  Take 1-2 every 6-8 hours if needed for pain, Disp: , Rfl:  .  lisinopril (PRINIVIL,ZESTRIL) 10 MG tablet, Take 1 tablet (10 mg total) by mouth daily., Disp: 90 tablet, Rfl: 3 .  montelukast (SINGULAIR) 10 MG tablet, Take 1  tablet (10 mg total) by mouth daily., Disp: 30 tablet, Rfl: 0 .  oxybutynin (DITROPAN-XL) 10 MG 24 hr tablet, Take 1 tablet (10 mg total) by mouth daily., Disp: 30 tablet, Rfl: 11 .  pantoprazole (PROTONIX) 20 MG tablet, Take 1 tablet (20 mg total) by mouth 2 (two) times daily., Disp: 60 tablet, Rfl: 3 .  potassium chloride (K-DUR,KLOR-CON) 10 MEQ tablet, Take 1 tablet (10 mEq total) by mouth 2 (two) times daily., Disp: 180 tablet, Rfl: 3 .  rosuvastatin (CRESTOR) 10 MG tablet, Take 1 tablet (10 mg total) by mouth daily., Disp: 90 tablet, Rfl: 3 .  tadalafil (ADCIRCA/CIALIS) 20 MG tablet, Take 1 tablet (20 mg total) by mouth daily as needed for erectile dysfunction., Disp: 15 tablet, Rfl: 11 .  valACYclovir (VALTREX) 1000 MG tablet, Take 1 tablet (1,000 mg total) by mouth 3 (three) times daily. (Patient taking differently: Take 1,000 mg by mouth daily. ), Disp: 21 tablet, Rfl: 0 .  vitamin B-12 1000 MCG tablet, Take 1 tablet (1,000 mcg total) by mouth daily., Disp: 180 tablet, Rfl: 1   Patient Care Team: Jerrol Banana., MD as PCP - General (Family Medicine) Virgel Manifold, MD as Consulting Physician (Gastroenterology) Lequita Asal, MD as Referring Physician (Hematology and Oncology) Billey Co, MD as Consulting Physician (Urology) Tyler Pita, MD as Consulting Physician (Pulmonary Disease) Dimmig, Marcello Moores, MD as Referring Physician (Orthopedic Surgery)    Objective:    Vitals: BP 118/68   Pulse 84   Temp 98.8 F (37.1 C)   Resp 16   Ht 6' (1.829 m)   Wt 209 lb (94.8 kg)   BMI 28.35 kg/m    Vitals:   04/23/19 0943  BP: 118/68  Pulse: 84  Resp: 16  Temp: 98.8 F (37.1 C)  Weight: 209 lb (94.8 kg)  Height: 6' (1.829 m)     Physical Exam Vitals signs reviewed.  Constitutional:      Appearance: He is well-developed.  HENT:     Head: Normocephalic and atraumatic.     Right Ear: External ear normal.     Left Ear: External ear normal.  Nose: Nose normal.  Eyes:     Conjunctiva/sclera: Conjunctivae normal.     Pupils: Pupils are equal, round, and reactive to light.  Neck:     Musculoskeletal: Normal range of motion and neck supple.  Cardiovascular:     Rate and Rhythm: Normal rate and regular rhythm.     Heart sounds: Normal heart sounds.  Pulmonary:     Effort: Pulmonary effort is normal.     Breath sounds: Normal breath sounds.  Abdominal:     General: Bowel sounds are normal.  Musculoskeletal: Normal range of motion.  Skin:    General: Skin is warm and dry.  Neurological:     General: No focal deficit present.     Mental Status: He is alert and oriented to person, place, and time.  Psychiatric:        Mood and Affect: Mood normal.        Behavior: Behavior normal.        Thought Content: Thought content normal.        Judgment: Judgment normal.      Depression Screen PHQ 2/9 Scores 04/23/2019 04/17/2018 12/04/2017 11/28/2016  PHQ - 2 Score 0 0 0 0  PHQ- 9 Score - - 0 0       Assessment & Plan:     Routine Health Maintenance and Physical Exam  Exercise Activities and Dietary recommendations Goals    . DIET - INCREASE WATER INTAKE     Recommend increasing water intake to 3 glasses a day.    . Reduce alcohol intake     Recommend to cut back on alcohol intake by half and only consuming 1-2 glasses a day instead of 3-4.        Immunization History  Administered Date(s) Administered  . Hepatitis A, Adult 03/27/2019  . Hepatitis B, adult 03/27/2019  . Influenza, High Dose Seasonal PF 08/19/2015, 09/02/2016, 10/03/2017, 05/31/2018  . Influenza,inj,quad, With Preservative 05/20/2018  . Pneumococcal Conjugate-13 02/06/2014  . Pneumococcal Polysaccharide-23 03/05/2012  . Pneumococcal-Unspecified 09/19/2014  . Tdap 04/28/2007    Health Maintenance  Topic Date Due  . TETANUS/TDAP  04/27/2017  . INFLUENZA VACCINE  04/20/2019  . COLONOSCOPY  04/06/2023  . Hepatitis C Screening  Completed  . PNA  vac Low Risk Adult  Completed     Discussed health benefits of physical activity, and encouraged him to engage in regular exercise appropriate for his age and condition.   1. Annual physical exam   2. Thrombocytopenia ([place])   3. Erectile dysfunction of organic origin   4. Hepatic cirrhosis, unspecified hepatic cirrhosis type, unspecified whether ascites present ([place]) Pt knows he needs to quit drinking.  I, [place] Presley, CMA, am acting as a Education administrator for [employer]. Rosanna Randy, [place]   [name], MD  [facility]

## 2019-04-23 NOTE — Progress Notes (Signed)
Subjective:   Dwayne Thompson is a 73 y.o. male who presents for Medicare Annual/Subsequent preventive examination.  Review of Systems:  N/A  Cardiac Risk Factors include: advanced age (>66men, >38 women);dyslipidemia;hypertension;male gender;sedentary lifestyle     Objective:    Vitals: BP 118/68 (BP Location: Right Arm)   Pulse 84   Temp 98.8 F (37.1 C) (Oral)   Ht 6' (1.829 m)   Wt 209 lb 6.4 oz (95 kg)   BMI 28.40 kg/m   Body mass index is 28.4 kg/m.  Advanced Directives 04/23/2019 02/13/2019 08/15/2018 05/16/2018 04/17/2018 04/05/2018 02/14/2018  Does Patient Have a Medical Advance Directive? No No Yes Yes No Yes;No No  Type of Advance Directive - - Living will - - - -  Does patient want to make changes to medical advance directive? - - No - Patient declined - - - -  Would patient like information on creating a medical advance directive? No - Patient declined No - Patient declined - - No - Patient declined - -    Tobacco Social History   Tobacco Use  Smoking Status Former Smoker  . Packs/day: 1.00  . Years: 50.00  . Pack years: 50.00  . Types: Cigarettes  . Quit date: 10/20/2013  . Years since quitting: 5.5  Smokeless Tobacco Never Used     Counseling given: Not Answered   Clinical Intake:  Pre-visit preparation completed: Yes  Pain : No/denies pain Pain Score: 0-No pain     Nutritional Status: BMI 25 -29 Overweight Nutritional Risks: None Diabetes: No  How often do you need to have someone help you when you read instructions, pamphlets, or other written materials from your doctor or pharmacy?: 1 - Never  Interpreter Needed?: No  Information entered by :: [facility], LPN  Past Medical History:  Diagnosis Date  . Anemia   . Back pain   . BPH (benign prostatic hyperplasia)   . ED (erectile dysfunction)   . Hyperlipidemia    Past Surgical History:  Procedure Laterality Date  . COLONOSCOPY Left 01/03/2018   Procedure: COLONOSCOPY;  Surgeon: Virgel Manifold, MD;  Location: [facility] ENDOSCOPY;  Service: Endoscopy;  Laterality: Left;  . COLONOSCOPY WITH PROPOFOL N/A 04/05/2018   Procedure: COLONOSCOPY WITH PROPOFOL;  Surgeon: Virgel Manifold, MD;  Location: [facility] ENDOSCOPY;  Service: Endoscopy;  Laterality: N/A;  . ENTEROSCOPY  01/03/2018   Procedure: ENTEROSCOPY;  Surgeon: Virgel Manifold, MD;  Location: [place] ENDOSCOPY;  Service: Endoscopy;;  . ESOPHAGOGASTRODUODENOSCOPY N/A 01/02/2018   Procedure: ESOPHAGOGASTRODUODENOSCOPY (EGD);  Surgeon: Virgel Manifold, MD;  Location: [facility] ENDOSCOPY;  Service: Endoscopy;  Laterality: N/A;  . ESOPHAGOGASTRODUODENOSCOPY (EGD) WITH PROPOFOL N/A 04/05/2018   Procedure: ESOPHAGOGASTRODUODENOSCOPY (EGD) WITH PROPOFOL;  Surgeon: Virgel Manifold, MD;  Location: [facility] ENDOSCOPY;  Service: Endoscopy;  Laterality: N/A;  . HERNIA REPAIR     1980's   Family History  Problem Relation Age of Onset  . Alzheimer's disease Mother   . Sarcoidosis Sister   . Anxiety disorder Brother   . Kidney disease Neg Hx   . Prostate cancer Neg Hx    Social History   Socioeconomic History  . Marital status: Married    Spouse name: [name]  . Number of children: 3  . Years of education: 55  . Highest education level: 12th grade  Occupational History  . Occupation: retired  Scientific laboratory technician  . Financial resource strain: Not hard at all  . Food insecurity    Worry: Never true  Inability: Never true  . Transportation needs    Medical: No    Non-medical: No  Tobacco Use  . Smoking status: Former Smoker    Packs/day: 1.00    Years: 50.00    Pack years: 50.00    Types: Cigarettes    Quit date: 10/20/2013    Years since quitting: 5.5  . Smokeless tobacco: Never Used  Substance and Sexual Activity  . Alcohol use: Yes    Alcohol/week: 21.0 - 28.0 standard drinks    Types: 21 - 28 Shots of liquor per week    Comment: 3-4 gin and sodas a day  . Drug use: No  . Sexual activity: Not Currently  Lifestyle  .  Physical activity    Days per week: 0 days    Minutes per session: 0 min  . Stress: Not at all  Relationships  . Social Herbalist on phone: Patient refused    Gets together: Patient refused    Attends religious service: Patient refused    Active member of club or organization: Patient refused    Attends meetings of clubs or organizations: Patient refused    Relationship status: Patient refused  Other Topics Concern  . Not on file  Social History Narrative  . Not on file    Outpatient Encounter Medications as of 04/23/2019  Medication Sig  . acetaminophen (TYLENOL) 325 MG tablet Take 2 tablets (650 mg total) by mouth 3 (three) times daily.  . AMBULATORY NON FORMULARY MEDICATION Trimix (30/1/10)-(Pap/Phent/PGE) test dose   Vial 39ml  Qty 1 no refills  [place] [phone] Fax [phone] (Patient taking differently: daily. Trimix (30/1/10)-(Pap/Phent/PGE) test dose   Vial 89ml  Qty 1 no refills  [place] [phone] Fax [phone])  . aspirin EC 81 MG EC tablet Take 1 tablet (81 mg total) by mouth daily.  . cetirizine (ZYRTEC) 10 MG tablet Take 1 tablet (10 mg total) by mouth daily.  . Cholecalciferol 1000 UNITS tablet Take 1,000 Units by mouth daily. Reported on 03/03/2016  . cyclobenzaprine (FLEXERIL) 10 MG tablet Take one tablet at night and then take 1 tablet twice a day PRN  . doxazosin (CARDURA) 4 MG tablet TAKE 1 TABLET DAILY  . folic acid (FOLVITE) 1 MG tablet Take 1 tablet (1 mg total) by mouth daily.  Marland Kitchen HYDROcodone-acetaminophen (NORCO/VICODIN) 5-325 MG tablet hydrocodone 5 mg-acetaminophen 325 mg tablet  Take 1-2 every 6-8 hours if needed for pain  . lisinopril (PRINIVIL,ZESTRIL) 10 MG tablet Take 1 tablet (10 mg total) by mouth daily.  . montelukast (SINGULAIR) 10 MG tablet Take 1 tablet (10 mg total) by mouth daily.  Marland Kitchen oxybutynin (DITROPAN-XL) 10 MG 24 hr tablet Take 1 tablet (10 mg total) by mouth daily.  .  pantoprazole (PROTONIX) 20 MG tablet Take 1 tablet (20 mg total) by mouth 2 (two) times daily.  . potassium chloride (K-DUR,KLOR-CON) 10 MEQ tablet Take 1 tablet (10 mEq total) by mouth 2 (two) times daily.  . rosuvastatin (CRESTOR) 10 MG tablet Take 1 tablet (10 mg total) by mouth daily.  . tadalafil (ADCIRCA/CIALIS) 20 MG tablet Take 1 tablet (20 mg total) by mouth daily as needed for erectile dysfunction.  . valACYclovir (VALTREX) 1000 MG tablet Take 1 tablet (1,000 mg total) by mouth 3 (three) times daily. (Patient taking differently: Take 1,000 mg by mouth daily. )  . vitamin B-12 1000 MCG tablet Take 1 tablet (1,000 mcg total) by mouth daily.  Marland Kitchen amoxicillin-clavulanate (AUGMENTIN)  875-125 MG tablet Take 1 tablet by mouth every 12 (twelve) hours. (Patient not taking: Reported on 04/23/2019)   No facility-administered encounter medications on file as of 04/23/2019.     Activities of Daily Living In your present state of health, do you have any difficulty performing the following activities: 04/23/2019  Hearing? N  Vision? N  Difficulty concentrating or making decisions? N  Walking or climbing stairs? N  Dressing or bathing? N  Doing errands, shopping? N  Preparing Food and eating ? N  Using the Toilet? N  In the past six months, have you accidently leaked urine? N  Do you have problems with loss of bowel control? N  Managing your Medications? N  Managing your Finances? N  Housekeeping or managing your Housekeeping? N  Some recent data might be hidden    Patient Care Team: Jerrol Banana., MD as PCP - General (Family Medicine) Virgel Manifold, MD as Consulting Physician (Gastroenterology) Lequita Asal, MD as Referring Physician (Hematology and Oncology) Billey Co, MD as Consulting Physician (Urology) Tyler Pita, MD as Consulting Physician (Pulmonary Disease) Dimmig, Marcello Moores, MD as Referring Physician (Orthopedic Surgery)   Assessment:   This is a  routine wellness examination for [place].  Exercise Activities and Dietary recommendations Current Exercise Habits: The patient does not participate in regular exercise at present, Exercise limited by: None identified  Goals    . DIET - INCREASE WATER INTAKE     Recommend increasing water intake to 3 glasses a day.    . Reduce alcohol intake     Recommend to cut back on alcohol intake by half and only consuming 1-2 glasses a day instead of 3-4.        Fall Risk: Fall Risk  04/23/2019 04/17/2018 12/04/2017 11/28/2016 04/29/2015  Falls in the past year? 0 No No No No    FALL RISK PREVENTION PERTAINING TO THE HOME:  Any stairs in or around the home? No  If so, are there any without handrails? N/A  Home free of loose throw rugs in walkways, pet beds, electrical cords, etc? Yes  Adequate lighting in your home to reduce risk of falls? Yes   ASSISTIVE DEVICES UTILIZED TO PREVENT FALLS:  Life alert? No  Use of a cane, walker or w/c? No  Grab bars in the bathroom? No  Shower chair or bench in shower? No  Elevated toilet seat or a handicapped toilet? No   TIMED UP AND GO:  Was the test performed? No .    Depression Screen PHQ 2/9 Scores 04/23/2019 04/17/2018 12/04/2017 11/28/2016  PHQ - 2 Score 0 0 0 0  PHQ- 9 Score - - 0 0    Cognitive Function: Declined today.      6CIT Screen 11/28/2016  What Year? 0 points  What month? 0 points  What time? 0 points  Count back from 20 0 points  Months in reverse 2 points  Repeat phrase 4 points  Total Score 6    Immunization History  Administered Date(s) Administered  . Hepatitis A, Adult 03/27/2019  . Hepatitis B, adult 03/27/2019  . Influenza, High Dose Seasonal PF 08/19/2015, 09/02/2016, 10/03/2017, 05/31/2018  . Influenza,inj,quad, With Preservative 05/20/2018  . Pneumococcal Conjugate-13 02/06/2014  . Pneumococcal Polysaccharide-23 03/05/2012  . Pneumococcal-Unspecified 09/19/2014  . Tdap 04/28/2007    Qualifies for Shingles  Vaccine? Yes . Due for Shingrix. Education has been provided regarding the importance of this vaccine. Pt has been advised to call  insurance company to determine out of pocket expense. Advised may also receive vaccine at local pharmacy or Health Dept. Verbalized acceptance and understanding.  Tdap: Although this vaccine is not a covered service during a Wellness Exam, does the patient still wish to receive this vaccine today?  No .  Education has been provided regarding the importance of this vaccine. Advised may receive this vaccine at local pharmacy or Health Dept. Aware to provide a copy of the vaccination record if obtained from local pharmacy or Health Dept. Verbalized acceptance and understanding.  Flu Vaccine: Up to date  Pneumococcal Vaccine: Completed series  Screening Tests Health Maintenance  Topic Date Due  . TETANUS/TDAP  04/27/2017  . INFLUENZA VACCINE  04/20/2019  . COLONOSCOPY  04/06/2023  . Hepatitis C Screening  Completed  . PNA vac Low Risk Adult  Completed   Cancer Screenings:  Colorectal Screening: Completed 04/05/18. Repeat every 5 years.  Lung Cancer Screening: (Low Dose CT Chest recommended if Age 21-80 years, 30 pack-year currently smoking OR have quit w/in 15years.) does qualify, however order is already placed my Dr Patsey Berthold.   Additional Screening:  Hepatitis C Screening: Up to date  Vision Screening: Recommended annual ophthalmology exams for early detection of glaucoma and other disorders of the eye.  Dental Screening: Recommended annual dental exams for proper oral hygiene  Community Resource Referral:  CRR required this visit?  No        Plan:  I have personally reviewed and addressed the Medicare Annual Wellness questionnaire and have noted the following in the patient's chart:  A. Medical and social history B. Use of alcohol, tobacco or illicit drugs  C. Current medications and supplements D. Functional ability and status E.  Nutritional  status F.  Physical activity G. Advance directives H. List of other physicians I.  Hospitalizations, surgeries, and ER visits in previous 12 months J.  [place] such as hearing and vision if needed, cognitive and depression L. Referrals and appointments   In addition, I have reviewed and discussed with patient certain preventive protocols, quality metrics, and best practice recommendations. A written personalized care plan for preventive services as well as general preventive health recommendations were provided to patient.   Glendora Score, LPN  7/0/2637 Nurse Health Advisor   Nurse Notes: None.

## 2019-04-23 NOTE — Progress Notes (Addendum)
Dwayne Thompson denies any history of lymphoma/leukemia, sickle cell disease, prior priapism or taking the medication trazodone.  He states he is no longer having spontaneous erections and the PDE5i's were ineffective.  His BP today is 130/87.  Patient's left corpus cavernosum is identified.  An area near the base of the penis is cleansed with rubbing alcohol.  Careful to avoid the dorsal vein, 4 mcg of Trimix (papaverine 30 mg, phentolamine 1 mg and prostaglandin E1 10 mcg, Lot # [id number]  exp # 05/26/2019) is injected at a 90 degree angle into the left corpus cavernosum near the base of the penis.  Patient experienced a soft erection in 15 minutes.    I then had Dwayne Thompson inject 2 mcg of the Trimix in the right corpus cavernosum.  He continues to have a soft erection.  I then injected 1 mcg of the Trimix into the left corpus cavernosum.  He achieved a satisfactory injection.  I examined the erection and is was semi-firm.    Advised patient of the condition of priapism, painful erection lasting for more than four hours, and to contact the office immediately or seek treatment in the ED   He will return in 3 months for recheck.   We will send in a prescription for Trimix, 8 mcg.

## 2019-04-24 ENCOUNTER — Encounter: Payer: Self-pay | Admitting: Urology

## 2019-04-24 ENCOUNTER — Ambulatory Visit (INDEPENDENT_AMBULATORY_CARE_PROVIDER_SITE_OTHER): Payer: Medicare Other | Admitting: Urology

## 2019-04-24 ENCOUNTER — Telehealth: Payer: Self-pay | Admitting: Urology

## 2019-04-24 VITALS — BP 130/87 | HR 82 | Ht 72.0 in | Wt 209.0 lb

## 2019-04-24 DIAGNOSIS — N529 Male erectile dysfunction, unspecified: Secondary | ICD-10-CM

## 2019-04-24 NOTE — Telephone Encounter (Signed)
Would you call in a script for Trimix (30/1/10)?  He is injecting 8 mcg.

## 2019-04-25 MED ORDER — AMBULATORY NON FORMULARY MEDICATION: 6 refills | Status: DC

## 2019-04-25 NOTE — Telephone Encounter (Signed)
Refill sent.

## 2019-04-26 ENCOUNTER — Telehealth: Payer: Self-pay | Admitting: Urology

## 2019-04-26 NOTE — Telephone Encounter (Signed)
Spoke with Dwayne Thompson regarding the ineffectiveness of the injection.  He states there is a bubble where he injected the medication.  I explained that he did not penetrate the erectile tissue and the medication just went under the skin.  He will come to [facility] on Monday morning, so that I can review with him on the proper technique.

## 2019-04-26 NOTE — Telephone Encounter (Signed)
Pt states he just injected himself and nothing happened. Pt would like to speak to [facility].

## 2019-04-29 ENCOUNTER — Encounter: Payer: Self-pay | Admitting: Urology

## 2019-04-29 ENCOUNTER — Ambulatory Visit (INDEPENDENT_AMBULATORY_CARE_PROVIDER_SITE_OTHER): Payer: Medicare Other | Admitting: Urology

## 2019-04-29 ENCOUNTER — Telehealth: Payer: Self-pay | Admitting: Urology

## 2019-04-29 ENCOUNTER — Other Ambulatory Visit: Payer: Self-pay

## 2019-04-29 VITALS — BP 124/72 | HR 72 | Ht 72.0 in | Wt 209.0 lb

## 2019-04-29 DIAGNOSIS — N529 Male erectile dysfunction, unspecified: Secondary | ICD-10-CM | POA: Diagnosis not present

## 2019-04-29 NOTE — Progress Notes (Signed)
Patient presents today for instructions on Trimix injections and to demonstrate his proficiency with the injections.  The patient washed his hands.  He then was instructed to tap the syringe to allow air bubbles to float to the top of the syringe. He then depressed the plunger to allow the air to escape.  He then selected an injection site at the right base of his penis between 9 and 11:00 positions between the base and midportion of the penis. He was careful to avoid the 6 and 12:00 positions and any surface veins or arteries.  He then grabbed the head of the penis towards the left with light tension.  He then cleansed the area using an alcohol swab. He then took the syringe and injected at a 90 angle into the corpus cavernosum 8 g of Trimix.  He then applied compression to the injection site.  He tolerated the procedure well. He stated he had good comfort level with the process of the injections.    He only received a semi-firm erection after 15 minutes.    We will increase the Trimix to the Super Trimix and have him return for another titration.    I spent 15 minutes with this patient in a face to face conservation of which greater than 50% was spent in counseling and coordination of care with the patient.

## 2019-04-29 NOTE — Telephone Encounter (Signed)
Would you please call in Trimix (30 mg PAPA, 1 mL phen, 50 mg prostaglandin) for Dwayne Thompson and schedule him for another titration appointment before 10 am?

## 2019-05-13 ENCOUNTER — Other Ambulatory Visit: Payer: Self-pay | Admitting: Family Medicine

## 2019-05-13 DIAGNOSIS — S161XXA Strain of muscle, fascia and tendon at neck level, initial encounter: Secondary | ICD-10-CM

## 2019-05-13 NOTE — Telephone Encounter (Signed)
Pharmacy requesting refills. Thanks!  

## 2019-05-14 ENCOUNTER — Other Ambulatory Visit: Payer: Self-pay

## 2019-05-15 ENCOUNTER — Inpatient Hospital Stay: Payer: Medicare Other | Attending: Hematology and Oncology

## 2019-05-15 DIAGNOSIS — E538 Deficiency of other specified B group vitamins: Secondary | ICD-10-CM | POA: Diagnosis not present

## 2019-05-15 DIAGNOSIS — R9389 Abnormal findings on diagnostic imaging of other specified body structures: Secondary | ICD-10-CM | POA: Insufficient documentation

## 2019-05-15 DIAGNOSIS — D696 Thrombocytopenia, unspecified: Secondary | ICD-10-CM | POA: Insufficient documentation

## 2019-05-15 DIAGNOSIS — Z7982 Long term (current) use of aspirin: Secondary | ICD-10-CM | POA: Insufficient documentation

## 2019-05-15 DIAGNOSIS — D500 Iron deficiency anemia secondary to blood loss (chronic): Secondary | ICD-10-CM | POA: Diagnosis present

## 2019-05-15 DIAGNOSIS — Z79899 Other long term (current) drug therapy: Secondary | ICD-10-CM | POA: Insufficient documentation

## 2019-05-15 LAB — CBC WITH DIFFERENTIAL/PLATELET
Abs Immature Granulocytes: 0.03 10*3/uL (ref 0.00–0.07)
Basophils Absolute: 0 10*3/uL (ref 0.0–0.1)
Basophils Relative: 0 %
Eosinophils Absolute: 0.1 10*3/uL (ref 0.0–0.5)
Eosinophils Relative: 3 %
HCT: 40.9 % (ref 39.0–52.0)
Hemoglobin: 14.3 g/dL (ref 13.0–17.0)
Immature Granulocytes: 1 %
Lymphocytes Relative: 31 %
Lymphs Abs: 1.5 10*3/uL (ref 0.7–4.0)
MCH: 30.6 pg (ref 26.0–34.0)
MCHC: 35 g/dL (ref 30.0–36.0)
MCV: 87.6 fL (ref 80.0–100.0)
Monocytes Absolute: 0.5 10*3/uL (ref 0.1–1.0)
Monocytes Relative: 10 %
Neutro Abs: 2.6 10*3/uL (ref 1.7–7.7)
Neutrophils Relative %: 55 %
Platelets: 109 10*3/uL — ABNORMAL LOW (ref 150–400)
RBC: 4.67 MIL/uL (ref 4.22–5.81)
RDW: 13.8 % (ref 11.5–15.5)
WBC: 4.8 10*3/uL (ref 4.0–10.5)
nRBC: 0 % (ref 0.0–0.2)

## 2019-05-15 LAB — FERRITIN: Ferritin: 90 ng/mL (ref 24–336)

## 2019-06-25 ENCOUNTER — Other Ambulatory Visit: Payer: Self-pay | Admitting: Pulmonary Disease

## 2019-06-26 ENCOUNTER — Ambulatory Visit: Payer: Medicare Other

## 2019-07-02 ENCOUNTER — Other Ambulatory Visit: Payer: Self-pay

## 2019-07-02 ENCOUNTER — Ambulatory Visit (INDEPENDENT_AMBULATORY_CARE_PROVIDER_SITE_OTHER): Payer: Medicare Other

## 2019-07-02 DIAGNOSIS — Z23 Encounter for immunization: Secondary | ICD-10-CM

## 2019-07-19 ENCOUNTER — Telehealth: Payer: Self-pay | Admitting: Urology

## 2019-07-19 MED ORDER — AMBULATORY NON FORMULARY MEDICATION: 6 refills | Status: DC

## 2019-07-19 NOTE — Telephone Encounter (Signed)
Pt called and states that he needs a refill for his medication that was sent to [place] (he did not know the name of it)

## 2019-07-19 NOTE — Telephone Encounter (Signed)
Per note 04/2019 higher dose trimix was supposed to be sent to [place].  Patient called to day to get it filled and was not called in.  New rx Trimix (30 mg PAPA, 1 mL phen, 50 mg prostaglandin) called [facility].  Patient aware.

## 2019-07-25 NOTE — Progress Notes (Signed)
Patient: Dwayne Thompson Male    DOB: [birth year]   73 y.o.   MRN: [id number] Visit Date: 07/29/2019  Today's Provider: [name], MD   Chief Complaint  Patient presents with  . Follow-up   Subjective:     HPI  Patient is feeling pretty well lately.  He has quit smoking but is having trouble giving up alcohol. Hepatic cirrhosis, unspecified hepatic cirrhosis type, unspecified whether ascites present ([place]) From 03/27/2019-Pt advised to quit all alcohol. Hep A and B vaccine started.  Dysuria From 03/27/2019-Ambulatory referral to Urology.  Hematuria, unspecified type From 03/27/2019-Ambulatory referral to Urology.    Allergies  Allergen Reactions  . Tamsulosin Hives  . Other Rash  . Tamsulosin Hcl Hives  . Sulfa Antibiotics Rash     Current Outpatient Medications:  .  acetaminophen (TYLENOL) 325 MG tablet, Take 2 tablets (650 mg total) by mouth 3 (three) times daily., Disp: 180 tablet, Rfl: 0 .  AMBULATORY NON FORMULARY MEDICATION, Trimix (30 mg PAPA, 1 mL phen, 50 mg prostaglandin)   Vial 41ml   Dose 0.22mcg  Qty 10 #6 refills  [place] [phone] Fax [phone], Disp: 10 vial, Rfl: 6 .  aspirin EC 81 MG EC tablet, Take 1 tablet (81 mg total) by mouth daily., Disp: 180 tablet, Rfl: 0 .  cetirizine (ZYRTEC) 10 MG tablet, Take 1 tablet (10 mg total) by mouth daily., Disp: 90 tablet, Rfl: 4 .  Cholecalciferol 1000 UNITS tablet, Take 1,000 Units by mouth daily. Reported on 03/03/2016, Disp: , Rfl:  .  cyclobenzaprine (FLEXERIL) 10 MG tablet, TAKE ONE TABLET BY MOUTH TWICE A DAY AS NEEDED AND TAKE ONE TABLET BY MOUTH EVERY EVENING, Disp: 90 tablet, Rfl: 3 .  doxazosin (CARDURA) 4 MG tablet, TAKE 1 TABLET DAILY, Disp: 90 tablet, Rfl: 3 .  folic acid (FOLVITE) 1 MG tablet, Take 1 tablet (1 mg total) by mouth daily., Disp: 30 tablet, Rfl: 0 .  HYDROcodone-acetaminophen (NORCO/VICODIN) 5-325 MG tablet, hydrocodone 5 mg-acetaminophen 325 mg tablet  Take 1-2  every 6-8 hours if needed for pain, Disp: , Rfl:  .  lisinopril (PRINIVIL,ZESTRIL) 10 MG tablet, Take 1 tablet (10 mg total) by mouth daily., Disp: 90 tablet, Rfl: 3 .  montelukast (SINGULAIR) 10 MG tablet, Take 1 tablet (10 mg total) by mouth daily., Disp: 30 tablet, Rfl: 0 .  oxybutynin (DITROPAN-XL) 10 MG 24 hr tablet, Take 1 tablet (10 mg total) by mouth daily., Disp: 30 tablet, Rfl: 11 .  pantoprazole (PROTONIX) 20 MG tablet, Take 1 tablet (20 mg total) by mouth 2 (two) times daily., Disp: 60 tablet, Rfl: 3 .  potassium chloride (K-DUR,KLOR-CON) 10 MEQ tablet, Take 1 tablet (10 mEq total) by mouth 2 (two) times daily., Disp: 180 tablet, Rfl: 3 .  rosuvastatin (CRESTOR) 10 MG tablet, Take 1 tablet (10 mg total) by mouth daily., Disp: 90 tablet, Rfl: 3 .  tadalafil (ADCIRCA/CIALIS) 20 MG tablet, Take 1 tablet (20 mg total) by mouth daily as needed for erectile dysfunction., Disp: 15 tablet, Rfl: 11 .  valACYclovir (VALTREX) 1000 MG tablet, Take 1 tablet (1,000 mg total) by mouth 3 (three) times daily. (Patient taking differently: Take 1,000 mg by mouth daily. ), Disp: 21 tablet, Rfl: 0 .  vitamin B-12 1000 MCG tablet, Take 1 tablet (1,000 mcg total) by mouth daily., Disp: 180 tablet, Rfl: 1  Review of Systems  Constitutional: Negative.  Negative for appetite change, chills and fever.  HENT:  Negative.   Eyes: Negative.   Respiratory: Negative.  Negative for chest tightness, shortness of breath and wheezing.   Cardiovascular: Negative.  Negative for chest pain and palpitations.  Gastrointestinal: Negative.  Negative for abdominal pain, nausea and vomiting.  Endocrine: Negative.   Genitourinary: Negative.   Musculoskeletal: Negative.   Skin: Negative.   Allergic/Immunologic: Negative.   Neurological: Negative.   Hematological: Negative.   Psychiatric/Behavioral: Negative.     Social History   Tobacco Use  . Smoking status: Former Smoker    Packs/day: 1.00    Years: 50.00    Pack  years: 50.00    Types: Cigarettes    Quit date: 10/20/2013    Years since quitting: 5.7  . Smokeless tobacco: Never Used  Substance Use Topics  . Alcohol use: Yes    Alcohol/week: 21.0 - 28.0 standard drinks    Types: 21 - 28 Shots of liquor per week    Comment: 3-4 gin and sodas a day      Objective:   There were no vitals taken for this visit. There were no vitals filed for this visit.There is no height or weight on file to calculate BMI.   Physical Exam Vitals signs reviewed.  Constitutional:      Appearance: He is well-developed.  HENT:     Head: Normocephalic and atraumatic.  Eyes:     General: No scleral icterus.    Conjunctiva/sclera: Conjunctivae normal.  Neck:     Thyroid: No thyromegaly.  Cardiovascular:     Rate and Rhythm: Normal rate and regular rhythm.     Heart sounds: Normal heart sounds.  Pulmonary:     Effort: Pulmonary effort is normal.     Breath sounds: Normal breath sounds.  Abdominal:     Palpations: Abdomen is soft.  Skin:    General: Skin is warm and dry.  Neurological:     Mental Status: He is alert and oriented to person, place, and time.  Psychiatric:        Behavior: Behavior normal.        Thought Content: Thought content normal.        Judgment: Judgment normal.      No results found for any visits on 07/29/19.     Assessment & Plan     1. Hepatic cirrhosis, unspecified hepatic cirrhosis type, unspecified whether ascites present [facility]) Patient advised to try to completely abstain from alcohol. - Lipid panel - CBC w/Diff/Platelet - Comprehensive Metabolic Panel (CMET) - TSH  2. Need for hepatitis B vaccination  - Hepatitis B vaccine adult IM  3. Essential hypertension Good control on lisinopril - Lipid panel - CBC w/Diff/Platelet - Comprehensive Metabolic Panel (CMET) - TSH  4. 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 Benign prostatic hyperplasia with urinary frequency Followed by urology  Follow up in 6  months.     Afra Tricarico Cranford Mon, MD  [facility]

## 2019-07-29 ENCOUNTER — Other Ambulatory Visit: Payer: Self-pay

## 2019-07-29 ENCOUNTER — Telehealth: Payer: Self-pay | Admitting: Pulmonary Disease

## 2019-07-29 ENCOUNTER — Encounter: Payer: Self-pay | Admitting: Family Medicine

## 2019-07-29 ENCOUNTER — Ambulatory Visit (INDEPENDENT_AMBULATORY_CARE_PROVIDER_SITE_OTHER): Payer: Medicare Other | Admitting: Family Medicine

## 2019-07-29 VITALS — BP 111/77 | HR 88 | Temp 97.1°F | Resp 18 | Ht 72.0 in | Wt 210.0 lb

## 2019-07-29 DIAGNOSIS — N401 Enlarged prostate with lower urinary tract symptoms: Secondary | ICD-10-CM

## 2019-07-29 DIAGNOSIS — D696 Thrombocytopenia, unspecified: Secondary | ICD-10-CM | POA: Diagnosis not present

## 2019-07-29 DIAGNOSIS — I10 Essential (primary) hypertension: Secondary | ICD-10-CM

## 2019-07-29 DIAGNOSIS — Z23 Encounter for immunization: Secondary | ICD-10-CM

## 2019-07-29 DIAGNOSIS — K7460 Unspecified cirrhosis of liver: Secondary | ICD-10-CM

## 2019-07-29 DIAGNOSIS — R350 Frequency of micturition: Secondary | ICD-10-CM

## 2019-07-29 MED ORDER — MONTELUKAST SODIUM 10 MG PO TABS: 10.0000 mg | ORAL_TABLET | Freq: Every day | ORAL | 1 refills | Status: DC

## 2019-07-29 NOTE — Telephone Encounter (Signed)
Rx for singulair 10mg  has been sent to preferred pharmacy. Both pt and his spouse are aware and voiced their understanding.  Nothing further is needed.

## 2019-07-30 ENCOUNTER — Encounter: Payer: Self-pay | Admitting: *Deleted

## 2019-07-30 LAB — COMPREHENSIVE METABOLIC PANEL
ALT: 17 IU/L (ref 0–44)
AST: 21 IU/L (ref 0–40)
Albumin/Globulin Ratio: 1.3 (ref 1.2–2.2)
Albumin: 4.3 g/dL (ref 3.7–4.7)
Alkaline Phosphatase: 69 IU/L (ref 39–117)
BUN/Creatinine Ratio: 11 (ref 10–24)
BUN: 12 mg/dL (ref 8–27)
Bilirubin Total: 0.5 mg/dL (ref 0.0–1.2)
CO2: 22 mmol/L (ref 20–29)
Calcium: 9.6 mg/dL (ref 8.6–10.2)
Chloride: 98 mmol/L (ref 96–106)
Creatinine, Ser: 1.12 mg/dL (ref 0.76–1.27)
GFR calc Af Amer: 75 mL/min/{1.73_m2} (ref >59)
GFR calc non Af Amer: 65 mL/min/{1.73_m2} (ref >59)
Globulin, Total: 3.2 g/dL (ref 1.5–4.5)
Glucose: 94 mg/dL (ref 65–99)
Potassium: 4.6 mmol/L (ref 3.5–5.2)
Sodium: 136 mmol/L (ref 134–144)
Total Protein: 7.5 g/dL (ref 6.0–8.5)

## 2019-07-30 LAB — CBC WITH DIFFERENTIAL/PLATELET
Basophils Absolute: 0 10*3/uL (ref 0.0–0.2)
Basos: 0 %
EOS (ABSOLUTE): 0.1 10*3/uL (ref 0.0–0.4)
Eos: 1 %
Hematocrit: 42.5 % (ref 37.5–51.0)
Hemoglobin: 15.3 g/dL (ref 13.0–17.7)
Immature Grans (Abs): 0 10*3/uL (ref 0.0–0.1)
Immature Granulocytes: 1 %
Lymphocytes Absolute: 1.6 10*3/uL (ref 0.7–3.1)
Lymphs: 20 %
MCH: 30.7 pg (ref 26.6–33.0)
MCHC: 36 g/dL — ABNORMAL HIGH (ref 31.5–35.7)
MCV: 85 fL (ref 79–97)
Monocytes Absolute: 0.9 10*3/uL (ref 0.1–0.9)
Monocytes: 11 %
Neutrophils Absolute: 5.2 10*3/uL (ref 1.4–7.0)
Neutrophils: 67 %
Platelets: 123 10*3/uL — ABNORMAL LOW (ref 150–450)
RBC: 4.99 x10E6/uL (ref 4.14–5.80)
RDW: 12.8 % (ref 11.6–15.4)
WBC: 7.8 10*3/uL (ref 3.4–10.8)

## 2019-07-30 LAB — LIPID PANEL
Chol/HDL Ratio: 2.6 ratio (ref 0.0–5.0)
Cholesterol, Total: 99 mg/dL — ABNORMAL LOW (ref 100–199)
HDL: 38 mg/dL — ABNORMAL LOW (ref >39)
LDL Chol Calc (NIH): 49 mg/dL (ref 0–99)
Triglycerides: 46 mg/dL (ref 0–149)
VLDL Cholesterol Cal: 12 mg/dL (ref 5–40)

## 2019-07-30 LAB — TSH: TSH: 0.738 u[IU]/mL (ref 0.450–4.500)

## 2019-07-31 ENCOUNTER — Ambulatory Visit (INDEPENDENT_AMBULATORY_CARE_PROVIDER_SITE_OTHER): Payer: Medicare Other | Admitting: Physician Assistant

## 2019-07-31 ENCOUNTER — Other Ambulatory Visit: Payer: Self-pay

## 2019-07-31 ENCOUNTER — Encounter: Payer: Self-pay | Admitting: Physician Assistant

## 2019-07-31 VITALS — BP 113/68 | HR 103 | Ht 72.0 in | Wt 210.0 lb

## 2019-07-31 DIAGNOSIS — R300 Dysuria: Secondary | ICD-10-CM | POA: Diagnosis not present

## 2019-07-31 LAB — URINALYSIS, COMPLETE
Bilirubin, UA: NEGATIVE
Glucose, UA: NEGATIVE
Ketones, UA: NEGATIVE
Leukocytes,UA: NEGATIVE
Nitrite, UA: NEGATIVE
Protein,UA: NEGATIVE
RBC, UA: NEGATIVE
Specific Gravity, UA: 1.015 (ref 1.005–1.030)
Urobilinogen, Ur: 0.2 mg/dL (ref 0.2–1.0)
pH, UA: 5.5 (ref 5.0–7.5)

## 2019-07-31 LAB — MICROSCOPIC EXAMINATION: RBC, Urine: NONE SEEN /hpf (ref 0–2)

## 2019-07-31 LAB — BLADDER SCAN AMB NON-IMAGING

## 2019-07-31 NOTE — Progress Notes (Signed)
07/31/2019 10:51 AM   Dwayne Thompson October 26, 1945 [id number]  CC: Dysuria  HPI: Dwayne Thompson is a 73 y.o. male who presents today for evaluation of possible UTI. He is an established [facility] patient who last saw Dwayne Thompson on 04/29/2019 for Trimix teaching. He is otherwise managed by Dr. Diamantina Providence with a history of BPH and OAB with a 90cc prostate. History of PVRs circa 39mL.  He is allergic to tamsulosin. He takes doxazosin 4mg  and oxybutynin 10mg  daily. He was previously prescribed Cialis PRN for ED, but reports he does not take this.  Today he describes a 1 week history of worsening dysuria and urinary frequency.  He denies gross hematuria, fevers, chills, nausea, vomiting, and flank pain.  He does report some suprapubic pain and general "not feeling right."  He reports dysuria, frequency, hesitancy, nocturia, and weak stream at baseline.  In-office UA and microscopy today pan-negative. PVR >190mL. He subsequently voided and was rescanned with a value of 213mL.  PMH: Past Medical History:  Diagnosis Date  . Anemia   . Back pain   . BPH (benign prostatic hyperplasia)   . ED (erectile dysfunction)   . Hyperlipidemia     Surgical History: Past Surgical History:  Procedure Laterality Date  . COLONOSCOPY Left 01/03/2018   Procedure: COLONOSCOPY;  Surgeon: Virgel Manifold, MD;  Location: [facility] ENDOSCOPY;  Service: Endoscopy;  Laterality: Left;  . COLONOSCOPY WITH PROPOFOL N/A 04/05/2018   Procedure: COLONOSCOPY WITH PROPOFOL;  Surgeon: Virgel Manifold, MD;  Location: [facility] ENDOSCOPY;  Service: Endoscopy;  Laterality: N/A;  . ENTEROSCOPY  01/03/2018   Procedure: ENTEROSCOPY;  Surgeon: Virgel Manifold, MD;  Location: [place] ENDOSCOPY;  Service: Endoscopy;;  . ESOPHAGOGASTRODUODENOSCOPY N/A 01/02/2018   Procedure: ESOPHAGOGASTRODUODENOSCOPY (EGD);  Surgeon: Virgel Manifold, MD;  Location: [facility] ENDOSCOPY;  Service: Endoscopy;  Laterality: N/A;  . ESOPHAGOGASTRODUODENOSCOPY  (EGD) WITH PROPOFOL N/A 04/05/2018   Procedure: ESOPHAGOGASTRODUODENOSCOPY (EGD) WITH PROPOFOL;  Surgeon: Virgel Manifold, MD;  Location: [facility] ENDOSCOPY;  Service: Endoscopy;  Laterality: N/A;  . HERNIA REPAIR     1980's    Home Medications:  Allergies as of 07/31/2019      Reactions   Tamsulosin Hives   Other Rash   Tamsulosin Hcl Hives   Sulfa Antibiotics Rash      Medication List       Accurate as of July 31, 2019 10:51 AM. If you have any questions, ask your nurse or doctor.        acetaminophen 325 MG tablet Commonly known as: TYLENOL Take 2 tablets (650 mg total) by mouth 3 (three) times daily.   AMBULATORY NON FORMULARY MEDICATION Trimix (30 mg PAPA, 1 mL phen, 50 mg prostaglandin)   Vial 60ml   Dose 0.53mcg  Qty 10 #6 refills  [place] [phone] Fax [phone]   aspirin 81 MG EC tablet Take 1 tablet (81 mg total) by mouth daily.   cetirizine 10 MG tablet Commonly known as: ZYRTEC Take 1 tablet (10 mg total) by mouth daily.   Cholecalciferol 25 MCG (1000 UT) tablet Take 1,000 Units by mouth daily. Reported on 03/03/2016   cyanocobalamin 1000 MCG tablet Take 1 tablet (1,000 mcg total) by mouth daily.   cyclobenzaprine 10 MG tablet Commonly known as: FLEXERIL TAKE ONE TABLET BY MOUTH TWICE A DAY AS NEEDED AND TAKE ONE TABLET BY MOUTH EVERY EVENING   doxazosin 4 MG tablet Commonly known as: CARDURA TAKE 1 TABLET DAILY   folic acid  1 MG tablet Commonly known as: FOLVITE Take 1 tablet (1 mg total) by mouth daily.   HYDROcodone-acetaminophen 5-325 MG tablet Commonly known as: NORCO/VICODIN hydrocodone 5 mg-acetaminophen 325 mg tablet  Take 1-2 every 6-8 hours if needed for pain   lisinopril 10 MG tablet Commonly known as: ZESTRIL Take 1 tablet (10 mg total) by mouth daily.   montelukast 10 MG tablet Commonly known as: SINGULAIR Take 1 tablet (10 mg total) by mouth daily.   oxybutynin 10 MG 24 hr tablet Commonly  known as: DITROPAN-XL Take 1 tablet (10 mg total) by mouth daily.   pantoprazole 20 MG tablet Commonly known as: PROTONIX Take 1 tablet (20 mg total) by mouth 2 (two) times daily.   potassium chloride 10 MEQ tablet Commonly known as: KLOR-CON Take 1 tablet (10 mEq total) by mouth 2 (two) times daily.   rosuvastatin 10 MG tablet Commonly known as: Crestor Take 1 tablet (10 mg total) by mouth daily.   tadalafil 20 MG tablet Commonly known as: CIALIS Take 1 tablet (20 mg total) by mouth daily as needed for erectile dysfunction.   valACYclovir 1000 MG tablet Commonly known as: VALTREX Take 1 tablet (1,000 mg total) by mouth 3 (three) times daily. What changed: when to take this       Allergies:  Allergies  Allergen Reactions  . Tamsulosin Hives  . Other Rash  . Tamsulosin Hcl Hives  . Sulfa Antibiotics Rash    Family History: Family History  Problem Relation Age of Onset  . Alzheimer's disease Mother   . Sarcoidosis Sister   . Anxiety disorder Brother   . Kidney disease Neg Hx   . Prostate cancer Neg Hx     Social History:   reports that he quit smoking about 5 years ago. His smoking use included cigarettes. He has a 50.00 pack-year smoking history. He has never used smokeless tobacco. He reports current alcohol use of about 21.0 - 28.0 standard drinks of alcohol per week. He reports that he does not use drugs.  ROS: UROLOGY Frequent Urination?: No Hard to postpone urination?: No Burning/pain with urination?: Yes Get up at night to urinate?: No Leakage of urine?: No Urine stream starts and stops?: No Trouble starting stream?: No Do you have to strain to urinate?: No Blood in urine?: No Urinary tract infection?: No Sexually transmitted disease?: No Injury to kidneys or bladder?: No Painful intercourse?: No Weak stream?: No Erection problems?: No Penile pain?: No  Gastrointestinal Nausea?: No Vomiting?: No Indigestion/heartburn?: No Diarrhea?: No  Constipation?: No  Constitutional Fever: No Night sweats?: No Weight loss?: No Fatigue?: No  Skin Skin rash/lesions?: No Itching?: No  Eyes Blurred vision?: No Double vision?: No  Ears/Nose/Throat Sore throat?: No Sinus problems?: No  Hematologic/Lymphatic Swollen glands?: No Easy bruising?: No  Cardiovascular Leg swelling?: No Chest pain?: No  Respiratory Cough?: No Shortness of breath?: No  Endocrine Excessive thirst?: No  Musculoskeletal Back pain?: No Joint pain?: No  Neurological Headaches?: No Dizziness?: No  Psychologic Depression?: No Anxiety?: No  Physical Exam: BP 113/68   Pulse (!) 103   Ht 6' (1.829 m)   Wt 210 lb (95.3 kg)   BMI 28.48 kg/m   Constitutional:  Alert and oriented, no acute distress, nontoxic appearing HEENT: [place], AT Cardiovascular: No clubbing, cyanosis, or edema Respiratory: Normal respiratory effort, no increased work of breathing Skin: No rashes, bruises or suspicious lesions Neurologic: Grossly intact, no focal deficits, moving all 4 extremities Psychiatric: Normal mood and  affect  Laboratory Data: Results for orders placed or performed in visit on 07/31/19  Microscopic Examination   URINE  Result Value Ref Range   WBC, UA 0-5 0 - 5 /hpf   RBC None seen 0 - 2 /hpf   Epithelial Cells (non renal) 0-10 0 - 10 /hpf   Bacteria, UA Few None seen/Few  Urinalysis, Complete  Result Value Ref Range   Specific Gravity, UA 1.015 1.005 - 1.030   pH, UA 5.5 5.0 - 7.5   Color, UA Yellow Yellow   Appearance Ur Clear Clear   Leukocytes,UA Negative Negative   Protein,UA Negative Negative/Trace   Glucose, UA Negative Negative   Ketones, UA Negative Negative   RBC, UA Negative Negative   Bilirubin, UA Negative Negative   Urobilinogen, Ur 0.2 0.2 - 1.0 mg/dL   Nitrite, UA Negative Negative   Microscopic Examination See below:   Bladder Scan (Post Void Residual) in office  Result Value Ref Range   Scan Result >11mL    Bladder Scan (Post Void Residual) in office  Result Value Ref Range   Scan Result 215mL    Assessment & Plan:   1. Dysuria 73 year old male with history of OAB, BPH with 90 cc gland, and ED here with a 1 week history of worsening dysuria.  UA reassuring for infection.  PVR elevated at 230 mL.  Patient presents with new incomplete bladder emptying.  Differential includes UTI versus BPH.  Patient already takes dutasteride and oxybutynin for his BPH and OAB symptoms.  He is allergic to tamsulosin and has thus maximized therapy. Additionally, he previously failed Cialis for ED. Patient is interested in pursuing a bladder outlet procedure. In the meantime, I instructed him on CIC today with instructions to self-cath once daily. Procedure note below.  Per prior note by Dr. Diamantina Providence, patient does have a history of benign UA with positive urine culture.  Will send urine for culture today and treat as indicated with plans to reassess symptoms following treatment.  If urine culture is negative, will refer patient for UDS per Dr. Doristine Counter request with subsequent cysto and TRUS for evaluation of bladder outlet procedures.  Continuous Intermittent Catheterization Due to incomplete bladder emptying patient is present today for a teaching of self I&O Catheterization. Patient was given detailed verbal and printed instructions of self catheterization. Patient was cleaned and prepped in a sterile fashion.  With instruction and assistance patient inserted a 16FR coud [employer] SpeediCath and urine return was noted 200 ml, urine was clear, red, and runny without clots in color. Patient tolerated well. Patient was given a sample bag with supplies to take home.  Instructions were given for patient to cath 1 time daily.  An order was placed with [facility] for catheters to be sent to the patient's home.  Preformed by: Debroah Loop, PA-C   - Bladder Scan (Post Void Residual) in office - CULTURE, URINE  COMPREHENSIVE - Urinalysis, Complete  Return in about 4 weeks (around 08/28/2019) for Cysto and TRUS with bladder outlet consultation.  Debroah Loop, PA-C  [facility] [address] [place], [place] [address] [phone]

## 2019-08-01 ENCOUNTER — Encounter: Payer: Self-pay | Admitting: Urology

## 2019-08-01 ENCOUNTER — Ambulatory Visit (INDEPENDENT_AMBULATORY_CARE_PROVIDER_SITE_OTHER): Payer: Medicare Other | Admitting: Urology

## 2019-08-01 ENCOUNTER — Telehealth: Payer: Self-pay

## 2019-08-01 VITALS — BP 145/95 | HR 116 | Ht 72.0 in | Wt 210.0 lb

## 2019-08-01 DIAGNOSIS — R339 Retention of urine, unspecified: Secondary | ICD-10-CM | POA: Diagnosis not present

## 2019-08-01 LAB — BLADDER SCAN AMB NON-IMAGING: Scan Result: 571

## 2019-08-01 NOTE — Telephone Encounter (Signed)
Patient called saying he used the catheter this morning and was able to empty but then about 10 minutes later her felt the urge to urinate so he cathed again and it only dripped.  Asked the patient if he is sure he got past the prostate and he says he thinks he did. He says he is still hurting as well.  Please advise.

## 2019-08-01 NOTE — Telephone Encounter (Signed)
Patient seen in office

## 2019-08-02 ENCOUNTER — Other Ambulatory Visit: Payer: Self-pay | Admitting: Family Medicine

## 2019-08-02 MED ORDER — OXYBUTYNIN CHLORIDE ER 10 MG PO TB24: ORAL_TABLET | ORAL | 0 refills | Status: DC

## 2019-08-02 NOTE — Telephone Encounter (Signed)
Patient called complaining of pain in bladder then he urinates in the catheter bag. I informed him that he is having bladder spasms. I sent a RX in for Oxybutynin to take as needed for bladder spasms.

## 2019-08-04 LAB — CULTURE, URINE COMPREHENSIVE

## 2019-08-05 ENCOUNTER — Other Ambulatory Visit: Payer: Self-pay | Admitting: Physician Assistant

## 2019-08-05 DIAGNOSIS — R339 Retention of urine, unspecified: Secondary | ICD-10-CM

## 2019-08-05 NOTE — Progress Notes (Signed)
08/01/2019 11:28 AM   [name] 1946-04-14 [id number]  Referring provider: Jerrol Banana., MD [address] [place],  [place] [address]  Chief Complaint  Patient presents with  . Urinary Retention    HPI: Mr. Dwayne Thompson is a 73 year old male with ED and BPH with LU TS with an inability to urinate.  ED Currently using Trimix  BPH WITH LUTS  (prostate and/or bladder) Patient with an enlarged 90 cc prostate  Urinary retention He was instructed for CIC on July 31, 2019 as his PVR was greater than 200 mL.  This morning he stated that he attempted CIC but that no urine was returned.  He states he is also having some blood when he caths himself.  He is unable to urinate on his own at this time and is having intense suprapubic pain and bladder spasms.  Bladder scan notes 571 mL.  Patient denies any dysuria or flank pain.  Patient denies any fevers, chills, nausea or vomiting.    PMH: Past Medical History:  Diagnosis Date  . Anemia   . Back pain   . BPH (benign prostatic hyperplasia)   . ED (erectile dysfunction)   . Hyperlipidemia     Surgical History: Past Surgical History:  Procedure Laterality Date  . COLONOSCOPY Left 01/03/2018   Procedure: COLONOSCOPY;  Surgeon: Virgel Manifold, MD;  Location: [facility] ENDOSCOPY;  Service: Endoscopy;  Laterality: Left;  . COLONOSCOPY WITH PROPOFOL N/A 04/05/2018   Procedure: COLONOSCOPY WITH PROPOFOL;  Surgeon: Virgel Manifold, MD;  Location: [facility] ENDOSCOPY;  Service: Endoscopy;  Laterality: N/A;  . ENTEROSCOPY  01/03/2018   Procedure: ENTEROSCOPY;  Surgeon: Virgel Manifold, MD;  Location: [place] ENDOSCOPY;  Service: Endoscopy;;  . ESOPHAGOGASTRODUODENOSCOPY N/A 01/02/2018   Procedure: ESOPHAGOGASTRODUODENOSCOPY (EGD);  Surgeon: Virgel Manifold, MD;  Location: [facility] ENDOSCOPY;  Service: Endoscopy;  Laterality: N/A;  . ESOPHAGOGASTRODUODENOSCOPY (EGD) WITH PROPOFOL N/A 04/05/2018   Procedure:  ESOPHAGOGASTRODUODENOSCOPY (EGD) WITH PROPOFOL;  Surgeon: Virgel Manifold, MD;  Location: [facility] ENDOSCOPY;  Service: Endoscopy;  Laterality: N/A;  . HERNIA REPAIR     1980's    Home Medications:  Allergies as of 08/01/2019      Reactions   Tamsulosin Hives   Other Rash   Tamsulosin Hcl Hives   Sulfa Antibiotics Rash      Medication List       Accurate as of August 01, 2019 11:59 PM. If you have any questions, ask your nurse or doctor.        acetaminophen 325 MG tablet Commonly known as: TYLENOL Take 2 tablets (650 mg total) by mouth 3 (three) times daily.   AMBULATORY NON FORMULARY MEDICATION Trimix (30 mg PAPA, 1 mL phen, 50 mg prostaglandin)   Vial 62ml   Dose 0.57mcg  Qty 10 #6 refills  [place] [phone] Fax [phone]   aspirin 81 MG EC tablet Take 1 tablet (81 mg total) by mouth daily.   cetirizine 10 MG tablet Commonly known as: ZYRTEC Take 1 tablet (10 mg total) by mouth daily.   Cholecalciferol 25 MCG (1000 UT) tablet Take 1,000 Units by mouth daily. Reported on 03/03/2016   cyanocobalamin 1000 MCG tablet Take 1 tablet (1,000 mcg total) by mouth daily.   cyclobenzaprine 10 MG tablet Commonly known as: FLEXERIL TAKE ONE TABLET BY MOUTH TWICE A DAY AS NEEDED AND TAKE ONE TABLET BY MOUTH EVERY EVENING   doxazosin 4 MG tablet Commonly known as: CARDURA  TAKE 1 TABLET DAILY   folic acid 1 MG tablet Commonly known as: FOLVITE Take 1 tablet (1 mg total) by mouth daily.   HYDROcodone-acetaminophen 5-325 MG tablet Commonly known as: NORCO/VICODIN hydrocodone 5 mg-acetaminophen 325 mg tablet  Take 1-2 every 6-8 hours if needed for pain   lisinopril 10 MG tablet Commonly known as: ZESTRIL Take 1 tablet (10 mg total) by mouth daily.   montelukast 10 MG tablet Commonly known as: SINGULAIR Take 1 tablet (10 mg total) by mouth daily.   oxybutynin 10 MG 24 hr tablet Commonly known as: DITROPAN-XL Take 1 tablet (10 mg total)  by mouth daily.   pantoprazole 20 MG tablet Commonly known as: PROTONIX Take 1 tablet (20 mg total) by mouth 2 (two) times daily.   potassium chloride 10 MEQ tablet Commonly known as: KLOR-CON Take 1 tablet (10 mEq total) by mouth 2 (two) times daily.   rosuvastatin 10 MG tablet Commonly known as: Crestor Take 1 tablet (10 mg total) by mouth daily.   tadalafil 20 MG tablet Commonly known as: CIALIS Take 1 tablet (20 mg total) by mouth daily as needed for erectile dysfunction.   valACYclovir 1000 MG tablet Commonly known as: VALTREX Take 1 tablet (1,000 mg total) by mouth 3 (three) times daily. What changed: when to take this       Allergies:  Allergies  Allergen Reactions  . Tamsulosin Hives  . Other Rash  . Tamsulosin Hcl Hives  . Sulfa Antibiotics Rash    Family History: Family History  Problem Relation Age of Onset  . Alzheimer's disease Mother   . Sarcoidosis Sister   . Anxiety disorder Brother   . Kidney disease Neg Hx   . Prostate cancer Neg Hx     Social History:  reports that he quit smoking about 5 years ago. His smoking use included cigarettes. He has a 50.00 pack-year smoking history. He has never used smokeless tobacco. He reports current alcohol use of about 21.0 - 28.0 standard drinks of alcohol per week. He reports that he does not use drugs.  ROS: UROLOGY Frequent Urination?: Yes Hard to postpone urination?: Yes Burning/pain with urination?: Yes Get up at night to urinate?: Yes Leakage of urine?: Yes Urine stream starts and stops?: Yes Trouble starting stream?: Yes Do you have to strain to urinate?: Yes Blood in urine?: Yes Urinary tract infection?: Yes Sexually transmitted disease?: Yes Injury to kidneys or bladder?: Yes Painful intercourse?: Yes Weak stream?: Yes Erection problems?: Yes Penile pain?: Yes  Gastrointestinal Nausea?: No Vomiting?: No Indigestion/heartburn?: No Diarrhea?: No Constipation?: No  Constitutional  Fever: No Night sweats?: No Weight loss?: No Fatigue?: No  Skin Skin rash/lesions?: No Itching?: No  Eyes Blurred vision?: No Double vision?: No  Ears/Nose/Throat Sore throat?: No Sinus problems?: No  Hematologic/Lymphatic Swollen glands?: No Easy bruising?: No  Cardiovascular Leg swelling?: No Chest pain?: No  Respiratory Cough?: No Shortness of breath?: No  Endocrine Excessive thirst?: No  Musculoskeletal Back pain?: No Joint pain?: No  Neurological Headaches?: No Dizziness?: No  Psychologic Depression?: No Anxiety?: No  Physical Exam: BP (!) 145/95   Pulse (!) 116   Ht 6' (1.829 m)   Wt 210 lb (95.3 kg)   BMI 28.48 kg/m   Constitutional:  Well nourished. Alert and oriented, No acute distress. HEENT: [place] AT, moist mucus membranes.  Trachea midline, no masses. Cardiovascular: No clubbing, cyanosis, or edema. Respiratory: Normal respiratory effort, no increased work of breathing.  GU: No CVA tenderness.  Bladder fullness noted.  Patient with uncircumcised phallus.  Foreskin easily retracted.  Urethral meatus is patent.  No penile discharge. No penile lesions or rashes. Scrotum without lesions, cysts, rashes and/or edema.   Neurologic: Grossly intact, no focal deficits, moving all 4 extremities. Psychiatric: Normal mood and affect.  Laboratory Data: Lab Results  Component Value Date   WBC 7.8 07/29/2019   HGB 15.3 07/29/2019   HCT 42.5 07/29/2019   MCV 85 07/29/2019   PLT 123 (L) 07/29/2019    Lab Results  Component Value Date   CREATININE 1.12 07/29/2019    Lab Results  Component Value Date   PSA 3.0 10/22/2014    No results found for: TESTOSTERONE  Lab Results  Component Value Date   HGBA1C 5.1 05/22/2018    Lab Results  Component Value Date   TSH 0.738 07/29/2019       Component Value Date/Time   CHOL 99 (L) 07/29/2019 1107   CHOL 107 07/10/2018 1046   HDL 38 (L) 07/29/2019 1107   CHOLHDL 2.6 07/29/2019 1107   LDLCALC  49 07/29/2019 1107    Lab Results  Component Value Date   AST 21 07/29/2019   Lab Results  Component Value Date   ALT 17 07/29/2019   No components found for: ALKALINEPHOPHATASE No components found for: BILIRUBINTOTAL  No results found for: ESTRADIOL  Urinalysis    Component Value Date/Time   APPEARANCEUR Clear 07/31/2019 1109   GLUCOSEU Negative 07/31/2019 1109   BILIRUBINUR Negative 07/31/2019 1109   PROTEINUR Negative 07/31/2019 1109   UROBILINOGEN 1.0 03/27/2019 1350   NITRITE Negative 07/31/2019 1109   LEUKOCYTESUR Negative 07/31/2019 1109    I have reviewed the labs.  Simple Catheter Placement Due to urinary retention patient is present today for a foley cath placement.  Patient was cleaned and prepped in a sterile fashion with betadine and lidocaine jelly 2% was instilled into the urethra.  A 18 FR Coude foley catheter was inserted, urine return was noted  560 ml, urine was yellow in color.  The balloon was filled with 10cc of sterile water.  A leg bag was attached for drainage. Patient was also given a night bag to take home and was given instruction on how to change from one bag to another.  Patient was given instruction on proper catheter care.  Patient tolerated well, no complications were noted   Performed by: [name], PA-C  Assessment & Plan:   1. Urinary retention - Foley catheter placed today  - Bladder Scan (Post Void Residual) in office - He desires to keep the indwelling Foley at this time and not continue CIC - Urine culture is pending - He will be scheduled for urodynamics for further evaluation of bladder function  Return for refer for UDS .  These notes generated with voice recognition software. I apologize for typographical errors.  [name], PA-C  [facility] [address]  [place], [place] [address] [phone]

## 2019-08-05 NOTE — Progress Notes (Signed)
Patient's urine culture from last week resulted negative.  I would like him to undergo urodynamics as part of his bladder outlet surgical evaluation, per Dr. Doristine Counter request.  Referral placed today.  I spoke with the patient via telephone to explain that I would like him to undergo urodynamics prior to his scheduled cystoscopy and TRUS next month.  He expressed understanding.  Patient was due for Trimix follow-up with [name] in 2 days.  We have canceled that appointment, as patient has a Foley catheter in place due to urinary retention.  I explained to him that we would reschedule this appointment once his bladder outlet problem is further evaluated and after he potentially undergo surgery.  He expressed understanding.  Today, he did report some mild gross hematuria.  He explains his urine is runny and light red in color.  I explained that this is normal with a catheter and an enlarged prostate.  I counseled the patient on signs and symptoms of gross hematuria requiring urgent evaluation, including thick, ketchup-like urinary output; passage of large clots; dark, maroon-colored urine; lower abdominal pain; lumbar pain; abdominal distention; and the inability to urinate.  I advised him to contact the office for assistance if he develops these symptoms during routine office hours, 8 AM to 5 PM Monday through Friday.  If outside those hours, I advised him to proceed to the emergency department. He expressed understanding.

## 2019-08-07 ENCOUNTER — Other Ambulatory Visit: Payer: Self-pay

## 2019-08-07 ENCOUNTER — Encounter: Payer: Self-pay | Admitting: Physician Assistant

## 2019-08-07 ENCOUNTER — Ambulatory Visit: Payer: Medicare Other | Admitting: Urology

## 2019-08-07 ENCOUNTER — Ambulatory Visit (INDEPENDENT_AMBULATORY_CARE_PROVIDER_SITE_OTHER): Payer: Medicare Other | Admitting: Physician Assistant

## 2019-08-07 VITALS — BP 135/85 | HR 106 | Ht 72.0 in | Wt 205.5 lb

## 2019-08-07 DIAGNOSIS — N3289 Other specified disorders of bladder: Secondary | ICD-10-CM

## 2019-08-07 DIAGNOSIS — R339 Retention of urine, unspecified: Secondary | ICD-10-CM

## 2019-08-07 NOTE — Patient Instructions (Signed)
Do not take oxybutynin for 2 weeks prior to urodynamics in [place].

## 2019-08-07 NOTE — Progress Notes (Signed)
08/07/2019 1:45 PM   Dwayne Thompson 02/06/1946 [id number]  CC: Penile pain  HPI: Dwayne Thompson is a 73 y.o. male who presents today for evaluation of penile pain. He is an established [facility] patient who last saw Dwayne Thompson on 08/01/2019 for acute urinary retention.  Foley catheter placed at that visit.  He is scheduled for cystoscopy with TRUS for evaluation of bladder outlet obstruction in 1 month.  In the interim, I have placed a referral for urodynamics in [place].  This has not yet been scheduled.  Patient reports penile and lower abdominal pain associated with his urinary catheter today.  He is finding it difficult to sleep and is uncomfortable with sitting.  51 French Foley catheter in place draining clear, yellow urine.  No penile discharge.  Bag not on tension.  He reports he has been taking oxybutynin XL 10 mg once daily for management of his discomfort.  He is considering a second opinion with Dr. Yves Dill for management of his urinary retention.  PMH: Past Medical History:  Diagnosis Date  . Anemia   . Back pain   . BPH (benign prostatic hyperplasia)   . ED (erectile dysfunction)   . Hyperlipidemia    Surgical History: Past Surgical History:  Procedure Laterality Date  . COLONOSCOPY Left 01/03/2018   Procedure: COLONOSCOPY;  Surgeon: Virgel Manifold, MD;  Location: [facility] ENDOSCOPY;  Service: Endoscopy;  Laterality: Left;  . COLONOSCOPY WITH PROPOFOL N/A 04/05/2018   Procedure: COLONOSCOPY WITH PROPOFOL;  Surgeon: Virgel Manifold, MD;  Location: [facility] ENDOSCOPY;  Service: Endoscopy;  Laterality: N/A;  . ENTEROSCOPY  01/03/2018   Procedure: ENTEROSCOPY;  Surgeon: Virgel Manifold, MD;  Location: [place] ENDOSCOPY;  Service: Endoscopy;;  . ESOPHAGOGASTRODUODENOSCOPY N/A 01/02/2018   Procedure: ESOPHAGOGASTRODUODENOSCOPY (EGD);  Surgeon: Virgel Manifold, MD;  Location: [facility] ENDOSCOPY;  Service: Endoscopy;  Laterality: N/A;  . ESOPHAGOGASTRODUODENOSCOPY (EGD)  WITH PROPOFOL N/A 04/05/2018   Procedure: ESOPHAGOGASTRODUODENOSCOPY (EGD) WITH PROPOFOL;  Surgeon: Virgel Manifold, MD;  Location: [facility] ENDOSCOPY;  Service: Endoscopy;  Laterality: N/A;  . HERNIA REPAIR     1980's   Home Medications:  Allergies as of 08/07/2019      Reactions   Tamsulosin Hives   Other Rash   Tamsulosin Hcl Hives   Sulfa Antibiotics Rash      Medication List       Accurate as of August 07, 2019  1:45 PM. If you have any questions, ask your nurse or doctor.        acetaminophen 325 MG tablet Commonly known as: TYLENOL Take 2 tablets (650 mg total) by mouth 3 (three) times daily.   AMBULATORY NON FORMULARY MEDICATION Trimix (30 mg PAPA, 1 mL phen, 50 mg prostaglandin)   Vial 34ml   Dose 0.36mcg  Qty 10 #6 refills  [place] [phone] Fax [phone]   aspirin 81 MG EC tablet Take 1 tablet (81 mg total) by mouth daily.   cetirizine 10 MG tablet Commonly known as: ZYRTEC Take 1 tablet (10 mg total) by mouth daily.   Cholecalciferol 25 MCG (1000 UT) tablet Take 1,000 Units by mouth daily. Reported on 03/03/2016   cyanocobalamin 1000 MCG tablet Take 1 tablet (1,000 mcg total) by mouth daily.   cyclobenzaprine 10 MG tablet Commonly known as: FLEXERIL TAKE ONE TABLET BY MOUTH TWICE A DAY AS NEEDED AND TAKE ONE TABLET BY MOUTH EVERY EVENING   doxazosin 4 MG tablet Commonly known as: CARDURA TAKE 1 TABLET DAILY  folic acid 1 MG tablet Commonly known as: FOLVITE Take 1 tablet (1 mg total) by mouth daily.   HYDROcodone-acetaminophen 5-325 MG tablet Commonly known as: NORCO/VICODIN hydrocodone 5 mg-acetaminophen 325 mg tablet  Take 1-2 every 6-8 hours if needed for pain   lisinopril 10 MG tablet Commonly known as: ZESTRIL Take 1 tablet (10 mg total) by mouth daily.   montelukast 10 MG tablet Commonly known as: SINGULAIR Take 1 tablet (10 mg total) by mouth daily.   oxybutynin 10 MG 24 hr tablet Commonly known as:  DITROPAN-XL Take 2 times daily as needed for bladder spasms.   pantoprazole 20 MG tablet Commonly known as: PROTONIX Take 1 tablet (20 mg total) by mouth 2 (two) times daily.   potassium chloride 10 MEQ tablet Commonly known as: KLOR-CON Take 1 tablet (10 mEq total) by mouth 2 (two) times daily.   rosuvastatin 10 MG tablet Commonly known as: Crestor Take 1 tablet (10 mg total) by mouth daily.   tadalafil 20 MG tablet Commonly known as: CIALIS Take 1 tablet (20 mg total) by mouth daily as needed for erectile dysfunction.   valACYclovir 1000 MG tablet Commonly known as: VALTREX Take 1 tablet (1,000 mg total) by mouth 3 (three) times daily. What changed: when to take this      Allergies:  Allergies  Allergen Reactions  . Tamsulosin Hives  . Other Rash  . Tamsulosin Hcl Hives  . Sulfa Antibiotics Rash   Family History: Family History  Problem Relation Age of Onset  . Alzheimer's disease Mother   . Sarcoidosis Sister   . Anxiety disorder Brother   . Kidney disease Neg Hx   . Prostate cancer Neg Hx    Social History:   reports that he quit smoking about 5 years ago. His smoking use included cigarettes. He has a 50.00 pack-year smoking history. He has never used smokeless tobacco. He reports current alcohol use of about 21.0 - 28.0 standard drinks of alcohol per week. He reports that he does not use drugs.  ROS: UROLOGY Frequent Urination?: Yes Hard to postpone urination?: No Burning/pain with urination?: Yes Get up at night to urinate?: Yes Leakage of urine?: No Urine stream starts and stops?: No Trouble starting stream?: No Do you have to strain to urinate?: Yes Blood in urine?: Yes Urinary tract infection?: No Sexually transmitted disease?: No Injury to kidneys or bladder?: No Painful intercourse?: No Weak stream?: No Erection problems?: No Penile pain?: No  Gastrointestinal Nausea?: No Vomiting?: No Indigestion/heartburn?: No Diarrhea?: No  Constipation?: No  Constitutional Fever: No Night sweats?: No Weight loss?: No Fatigue?: No  Skin Skin rash/lesions?: No Itching?: No  Eyes Blurred vision?: No Double vision?: No  Ears/Nose/Throat Sore throat?: No Sinus problems?: No  Hematologic/Lymphatic Swollen glands?: No Easy bruising?: No  Cardiovascular Leg swelling?: No Chest pain?: No  Respiratory Cough?: No Shortness of breath?: No  Endocrine Excessive thirst?: No  Musculoskeletal Back pain?: No Joint pain?: No  Neurological Headaches?: No Dizziness?: No  Psychologic Depression?: No Anxiety?: No  Physical Exam: BP 135/85   Pulse (!) 106   Ht 6' (1.829 m)   Wt 205 lb 8 oz (93.2 kg)   BMI 27.87 kg/m   Constitutional:  Alert and oriented, uncomfortable appearing, nontoxic appearing HEENT: [place], AT Cardiovascular: No clubbing, cyanosis, or edema Respiratory: Normal respiratory effort, no increased work of breathing GU: See HPI Skin: No rashes, bruises or suspicious lesions Neurologic: Grossly intact, no focal deficits, moving all 4 extremities Psychiatric:  Normal mood and affect  Assessment & Plan:   1. Bladder spasm 73 year old male with recent acute urinary retention with Foley in place here with complaints of penile and lower abdominal pain associated with his catheter.  Catheter draining, no gross hematuria.  Bag not on tension.  I believe his symptoms are consistent with bladder spasm.  I advised him to take oxybutynin XL 10 mg twice daily for management of this.  He expressed understanding.  2. Urinary retention I explained that in the setting of his urinary retention, it is necessary to intervene to ensure consistent bladder drainage.  He was unable to tolerate CIC when I taught him this method last week.  Thus, Foley catheter must remain in place.  He has not yet received a phone call to schedule urodynamics.  I advised him that he should receive this call in the coming days.  I  advised him that he will have to discontinue oxybutynin 2 weeks in advance of the study.  He expressed understanding.  Return if symptoms worsen or fail to improve.  Dwayne Loop, PA-C  [facility] [address] [place], [place] [address] [phone]

## 2019-08-16 ENCOUNTER — Ambulatory Visit: Payer: Medicare Other | Admitting: Hematology and Oncology

## 2019-08-16 ENCOUNTER — Other Ambulatory Visit: Payer: Medicare Other

## 2019-08-19 ENCOUNTER — Other Ambulatory Visit: Payer: Self-pay

## 2019-08-19 ENCOUNTER — Inpatient Hospital Stay: Payer: Medicare Other | Attending: Hematology and Oncology

## 2019-08-19 ENCOUNTER — Ambulatory Visit: Payer: Medicare Other | Admitting: Hematology and Oncology

## 2019-08-19 DIAGNOSIS — Z87891 Personal history of nicotine dependence: Secondary | ICD-10-CM | POA: Diagnosis not present

## 2019-08-19 DIAGNOSIS — D500 Iron deficiency anemia secondary to blood loss (chronic): Secondary | ICD-10-CM | POA: Diagnosis present

## 2019-08-19 DIAGNOSIS — Z7982 Long term (current) use of aspirin: Secondary | ICD-10-CM | POA: Diagnosis not present

## 2019-08-19 DIAGNOSIS — Z79899 Other long term (current) drug therapy: Secondary | ICD-10-CM | POA: Insufficient documentation

## 2019-08-19 DIAGNOSIS — E538 Deficiency of other specified B group vitamins: Secondary | ICD-10-CM | POA: Diagnosis not present

## 2019-08-19 DIAGNOSIS — D696 Thrombocytopenia, unspecified: Secondary | ICD-10-CM | POA: Insufficient documentation

## 2019-08-19 DIAGNOSIS — K922 Gastrointestinal hemorrhage, unspecified: Secondary | ICD-10-CM | POA: Insufficient documentation

## 2019-08-19 LAB — CBC WITH DIFFERENTIAL/PLATELET
Abs Immature Granulocytes: 0.05 10*3/uL (ref 0.00–0.07)
Basophils Absolute: 0 10*3/uL (ref 0.0–0.1)
Basophils Relative: 0 %
Eosinophils Absolute: 0.1 10*3/uL (ref 0.0–0.5)
Eosinophils Relative: 2 %
HCT: 41.8 % (ref 39.0–52.0)
Hemoglobin: 14.6 g/dL (ref 13.0–17.0)
Immature Granulocytes: 1 %
Lymphocytes Relative: 20 %
Lymphs Abs: 1.5 10*3/uL (ref 0.7–4.0)
MCH: 30.6 pg (ref 26.0–34.0)
MCHC: 34.9 g/dL (ref 30.0–36.0)
MCV: 87.6 fL (ref 80.0–100.0)
Monocytes Absolute: 0.7 10*3/uL (ref 0.1–1.0)
Monocytes Relative: 9 %
Neutro Abs: 5.2 10*3/uL (ref 1.7–7.7)
Neutrophils Relative %: 68 %
Platelets: 200 10*3/uL (ref 150–400)
RBC: 4.77 MIL/uL (ref 4.22–5.81)
RDW: 12.5 % (ref 11.5–15.5)
WBC: 7.6 10*3/uL (ref 4.0–10.5)
nRBC: 0 % (ref 0.0–0.2)

## 2019-08-19 LAB — FERRITIN: Ferritin: 325 ng/mL (ref 24–336)

## 2019-08-19 LAB — FOLATE: Folate: 41 ng/mL (ref >5.9)

## 2019-08-20 NOTE — Progress Notes (Signed)
[facility]  [address] [place], [place] [address] Phone: [phone]  Fax: [phone]   Telemedicine Office Visit:  08/21/2019  Referring physician: Jerrol Banana.,*  I connected with [name] on 08/21/2019 at 9:39 AM by videoconferencing and verified that I was speaking with the correct person using 2 identifiers.  The patient was at home.  I discussed the limitations, risk, security and privacy concerns of performing an evaluation and management service by videoconferencing and the availability of in person appointments.  I also discussed with the patient that there may be a patient responsible charge related to this service.  The patient expressed understanding and agreed to proceed.   Chief Complaint: Dwayne Thompson is a 73 y.o. male with anupper GI bleed secondary to AVMs, B12 and folate deficiency, who is seen for a 7 month assessment.   HPI: The patient was last seen in the hematology clinic on 02/13/2019 via telemedicine. At that time,  he felt fine. He noted one episode of hematuria.  He was cutting back his alcohol. Platelet count was 96,000. Ferritin was 156. Vitamin B12 1,357. Patient remained on oral B12 1,000 mcg supplementation and daily folic acid 1 mg.   He was seen by Dr. Patsey Berthold on 02/19/2019. He had nasal congestion which had been a chronic issue.  He had intermittent hoarseness.  He related this mostly to nasal congestion and postnasal drip.No hemoptysis noted. No epistaxis. No other issues. Patient's weight and appetite were stable. Dr. Patsey Berthold planned for a repeat chest CT in 6 months.   He was seen in consultation by Dr. [name] on 03/12/2019. He denied any chest pain, upper back pain, or signs of peripheral embolization. He was on blood pressure medicines with good control. Chest CT on 02/15/2019 showed a 4.0 cm ascending thoracic aortic aneurysm.  This did not pose any immediate risk, but long-term surveillance  was important for monitoring the aneurysm. He will follow up in 6 months.   He was seen by Dr. Diamantina Providence on 04/08/2019. The patient noted intermittent dysuria and some pelvic pain for a few weeks. He had nocturia 3-4 times a night. Urinalysis on 04/04/2019 was not particularly concerning, however urine culture ultimately grew Proteus mirabilis.  He had one episode of symptomatic hematuria when straining with urination. He received Augmentin BID x 10 days for UTI, and trial of Trimix. He will follow up in 6 months.   He was seen in urology by Debroah Loop, PA-C on 07/31/2019. He reported dysuria, frequency, hesitancy, nocturia, and weak stream at baseline.  He denied gross hematuria, fevers, chills, nausea, vomiting, and flank pain.  Patient was interested in pursuing a bladder outlet procedure. If urine culture was negative, Vaillancourt, PA-C would refer patient for UDS per Dr. Doristine Counter request with subsequent cysto and TRUS for evaluation of bladder outlet procedures.  He was seen by [name] on 08/01/2019 for urinary retention. He noted trouble with clean intermittent catheterization (CIC). He stated he was also having some blood when he caths himself. He was unable to urinate on his own at this time and was having intense suprapubic pain and bladder spasms. Bladder scan on 08/01/2019 noted 571 mL.  Patient denied any dysuria or flank pain.  Patient denied any fevers, chills, nausea or vomiting. Patient was scheduled for urodynamics for further evaluation of bladder function.   Labs followed: 05/15/2019: Hematocrit 40.9, hemoglobin 14.3, platelets 109,000, WBC 4,800. Ferritin 90.  08/19/2019: Hematocrit 41.8, hemoglobin 14.6,  platelets 200,000, WBC 7,600. Ferritin 325. Folate 41.0.   During the interim, he has felt "fine".  He is taking his B12 and folate. He notes a chest CT next week ordered by Dr. Patsey Berthold. His wife notes his PSA has increased from 4 to 12 since 2019. She reports  that they have been to a lot of urologists and they decided to stick with Dr. Eliberto Ivory.  He notes a having a Foley catheter placed 3 days ago because he was having trouble urinating. He notes hematuria which has since resolved. His wife notes he recently had an infection secondary to his catheter and he was put on antibiotics by Dr. Eliberto Ivory.  He notes getting tired quicker. He has shortness of breath on exertion. He denies any melena or hematochezia.    Past Medical History:  Diagnosis Date  . Anemia   . Back pain   . BPH (benign prostatic hyperplasia)   . ED (erectile dysfunction)   . Hyperlipidemia     Past Surgical History:  Procedure Laterality Date  . COLONOSCOPY Left 01/03/2018   Procedure: COLONOSCOPY;  Surgeon: Virgel Manifold, MD;  Location: [facility] ENDOSCOPY;  Service: Endoscopy;  Laterality: Left;  . COLONOSCOPY WITH PROPOFOL N/A 04/05/2018   Procedure: COLONOSCOPY WITH PROPOFOL;  Surgeon: Virgel Manifold, MD;  Location: [facility] ENDOSCOPY;  Service: Endoscopy;  Laterality: N/A;  . ENTEROSCOPY  01/03/2018   Procedure: ENTEROSCOPY;  Surgeon: Virgel Manifold, MD;  Location: [facility] ENDOSCOPY;  Service: Endoscopy;;  . ESOPHAGOGASTRODUODENOSCOPY N/A 01/02/2018   Procedure: ESOPHAGOGASTRODUODENOSCOPY (EGD);  Surgeon: Virgel Manifold, MD;  Location: [facility] ENDOSCOPY;  Service: Endoscopy;  Laterality: N/A;  . ESOPHAGOGASTRODUODENOSCOPY (EGD) WITH PROPOFOL N/A 04/05/2018   Procedure: ESOPHAGOGASTRODUODENOSCOPY (EGD) WITH PROPOFOL;  Surgeon: Virgel Manifold, MD;  Location: [facility] ENDOSCOPY;  Service: Endoscopy;  Laterality: N/A;  . HERNIA REPAIR     1980's    Family History  Problem Relation Age of Onset  . Alzheimer's disease Mother   . Sarcoidosis Sister   . Anxiety disorder Brother   . Kidney disease Neg Hx   . Prostate cancer Neg Hx     Social History:  reports that he quit smoking about 5 years ago. His smoking use included cigarettes. He has a 50.00 pack-year smoking  history. He has never used smokeless tobacco. He reports current alcohol use of about 21.0 - 28.0 standard drinks of alcohol per week. He reports that he does not use drugs. He drinks 2-3 alcohol shots/day. His wife's name isJanice and granddaughter's name isAriel. The patient is accompanied by [name] today.  Participants in the patient's visit and their role in the encounter included the patient, [name], and Waymon Budge, Therapist, sports, today.  The intake visit was provided by Waymon Budge, RN.  Allergies:  Allergies  Allergen Reactions  . Tamsulosin Hives  . Other Rash  . Tamsulosin Hcl Hives  . Sulfa Antibiotics Rash    Current Medications: Current Outpatient Medications  Medication Sig Dispense Refill  . acetaminophen (TYLENOL) 325 MG tablet Take 2 tablets (650 mg total) by mouth 3 (three) times daily. 180 tablet 0  . aspirin EC 81 MG EC tablet Take 1 tablet (81 mg total) by mouth daily. 180 tablet 0  . cetirizine (ZYRTEC) 10 MG tablet Take 1 tablet (10 mg total) by mouth daily. 90 tablet 4  . Cholecalciferol 1000 UNITS tablet Take 1,000 Units by mouth daily. Reported on 03/03/2016    . cyclobenzaprine (FLEXERIL) 10 MG tablet TAKE ONE TABLET BY MOUTH  TWICE A DAY AS NEEDED AND TAKE ONE TABLET BY MOUTH EVERY EVENING 90 tablet 3  . doxazosin (CARDURA) 4 MG tablet TAKE 1 TABLET DAILY 90 tablet 3  . folic acid (FOLVITE) 1 MG tablet Take 1 tablet (1 mg total) by mouth daily. 30 tablet 0  . HYDROcodone-acetaminophen (NORCO/VICODIN) 5-325 MG tablet hydrocodone 5 mg-acetaminophen 325 mg tablet  Take 1-2 every 6-8 hours if needed for pain    . lisinopril (PRINIVIL,ZESTRIL) 10 MG tablet Take 1 tablet (10 mg total) by mouth daily. 90 tablet 3  . montelukast (SINGULAIR) 10 MG tablet Take 1 tablet (10 mg total) by mouth daily. 90 tablet 1  . pantoprazole (PROTONIX) 20 MG tablet Take 1 tablet (20 mg total) by mouth 2 (two) times daily. 60 tablet 3  . potassium chloride (K-DUR,KLOR-CON) 10 MEQ  tablet Take 1 tablet (10 mEq total) by mouth 2 (two) times daily. 180 tablet 3  . rosuvastatin (CRESTOR) 10 MG tablet Take 1 tablet (10 mg total) by mouth daily. 90 tablet 3  . vitamin B-12 1000 MCG tablet Take 1 tablet (1,000 mcg total) by mouth daily. 180 tablet 1  . AMBULATORY NON FORMULARY MEDICATION Trimix (30 mg PAPA, 1 mL phen, 50 mg prostaglandin)   Vial 35ml   Dose 0.28mcg  Qty 10 #6 refills  [place] [phone] Fax [phone] (Patient not taking: Reported on 08/21/2019) 10 vial 6  . tadalafil (ADCIRCA/CIALIS) 20 MG tablet Take 1 tablet (20 mg total) by mouth daily as needed for erectile dysfunction. (Patient not taking: Reported on 08/21/2019) 15 tablet 11  . valACYclovir (VALTREX) 1000 MG tablet Take 1 tablet (1,000 mg total) by mouth 3 (three) times daily. (Patient not taking: Reported on 08/21/2019) 21 tablet 0   No current facility-administered medications for this visit.     Review of Systems  Constitutional: Positive for malaise/fatigue (gets tired quick). Negative for chills, diaphoresis, fever and weight loss.       Feels "fine".  HENT: Negative.  Negative for congestion, ear pain, hearing loss, nosebleeds, sinus pain and sore throat.   Eyes: Negative.  Negative for blurred vision, double vision, photophobia and pain.  Respiratory: Positive for shortness of breath (on exertion). Negative for cough, hemoptysis and sputum production.   Cardiovascular: Negative.  Negative for chest pain, palpitations, orthopnea, leg swelling and PND.  Gastrointestinal: Negative.  Negative for abdominal pain, blood in stool, constipation, diarrhea, melena, nausea and vomiting.  Genitourinary: Negative for dysuria, frequency, hematuria (s/p bladder catheterization- resolved) and urgency.       Foley catheter.  Musculoskeletal: Negative.  Negative for back pain, falls, joint pain and myalgias.  Skin: Negative.  Negative for itching and rash.  Neurological: Negative.  Negative  for dizziness, tremors, speech change, focal weakness, weakness and headaches.  Endo/Heme/Allergies: Negative.  Does not bruise/bleed easily.  Psychiatric/Behavioral: Negative.  Negative for depression and memory loss. The patient is not nervous/anxious and does not have insomnia.   All other systems reviewed and are negative.   Performance status (ECOG):  1  Physical Exam  Constitutional: He is oriented to person, place, and time. He appears well-developed and well-nourished. No distress.  HENT:  Head: Normocephalic and atraumatic.  Short brown hair with graying mustache.   Eyes:  Brown eyes. Glasses.  Neurological: He is alert and oriented to person, place, and time.  Skin: He is not diaphoretic.  Psychiatric: He has a normal mood and affect. His behavior is normal. Judgment and thought content normal.  Nursing note reviewed.   Imaging studies: 09/03/2018:  Low dose chest CT revealed a new area of subpleural consolidation in the right upper lobe, possibly infectious or malignant in etiology. There was a score of Lung-RADS 4B, suspicious. There was emphysema and aortic atherosclerosis.  09/24/2018:  Chest CT revealed improving subpleural patchy/nodular opacity in the right upper lobe. Interval change favored an infectious/inflammatory etiology.  12/04/2018:  Chest CT revealed continued mild interval improvement in pleural based opacity over the anterolateral right upper lobe measuring 1 x 2.3 cm (previously 1.3 x 2.3 cm) likely resolving infectious/inflammatory process. New similar appearing pleural based opacity over the anterolateral aspect of the right apex measuring 9 x 15 mm also likely representing infectious/inflammatory process.    Appointment on 08/19/2019  Component Date Value Ref Range Status  . Folate 08/19/2019 41.0  >5.9 ng/mL Final   Performed at [facility], [address] [place], [place] [address]  . Ferritin 08/19/2019 325  24 - 336 ng/mL Final    Performed at [facility], [address] [place], [place] [address]  . WBC 08/19/2019 7.6  4.0 - 10.5 K/uL Final  . RBC 08/19/2019 4.77  4.22 - 5.81 MIL/uL Final  . Hemoglobin 08/19/2019 14.6  13.0 - 17.0 g/dL Final  . HCT 08/19/2019 41.8  39.0 - 52.0 % Final  . MCV 08/19/2019 87.6  80.0 - 100.0 fL Final  . MCH 08/19/2019 30.6  26.0 - 34.0 pg Final  . MCHC 08/19/2019 34.9  30.0 - 36.0 g/dL Final  . RDW 08/19/2019 12.5  11.5 - 15.5 % Final  . Platelets 08/19/2019 200  150 - 400 K/uL Final  . nRBC 08/19/2019 0.0  0.0 - 0.2 % Final  . Neutrophils Relative % 08/19/2019 68  % Final  . Neutro Abs 08/19/2019 5.2  1.7 - 7.7 K/uL Final  . Lymphocytes Relative 08/19/2019 20  % Final  . Lymphs Abs 08/19/2019 1.5  0.7 - 4.0 K/uL Final  . Monocytes Relative 08/19/2019 9  % Final  . Monocytes Absolute 08/19/2019 0.7  0.1 - 1.0 K/uL Final  . Eosinophils Relative 08/19/2019 2  % Final  . Eosinophils Absolute 08/19/2019 0.1  0.0 - 0.5 K/uL Final  . Basophils Relative 08/19/2019 0  % Final  . Basophils Absolute 08/19/2019 0.0  0.0 - 0.1 K/uL Final  . Immature Granulocytes 08/19/2019 1  % Final  . Abs Immature Granulocytes 08/19/2019 0.05  0.00 - 0.07 K/uL Final   Performed at [facility], [address], [place], [place] [address]    Assessment:  CARLETON VANVALKENBURGH is a 73 y.o. 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 Platelet count has ranged between 101,000 - 134,000. During his active GI bleed platelet nadir was 83,000.  He has B12 deficiencyand folate deficiency.B12 was 221 on 01/03/2018 and 1357 on 02/12/2019.  Folate was 5.7 on 01/03/2018 and 25 on 08/15/2018. He is on oral E26STM folic acid. Dietis good. He drinks alcohol daily.  Anemia and thrombocytopenia work-up on 01/04/2018 revealed the following normal studies: hepatitis B surface antigen, hepatitis B core  antibody total, hepatitis C antibody, and ANA. Ferritin was 272. Iron saturation was 34% with a TIBC of 265. HIV testing was negative on 01/10/2018.Abdominal ultrasoundon 01/03/2018 revealed a normal spleen.  Ferritinhas been followed: 272 on 01/03/2018, 231 on 01/10/2018, 177 on 02/14/2018, 79 on 05/16/2018, 197 on 08/15/2018, 107 on 11/13/2018,  156 on 02/12/2019, 90 on 05/15/2019, and 325 on 08/19/2019.  Upper endoscopyon 01/02/2018 revealed a normal esophagus, erythematous mucosa in the antrum, erythematous duodenopathy, and nodular mucosa in the duodenal bulb. There was no ulcerations or active sites of bleeding. Pathologyrevealed mild acute duodenitis, negative for dysplasia and malignancy. 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 There was no dysplasia or malignancy.  Colonoscopyon 01/03/2018 revealed melena in the entire colon c/w bleeding from the proximal GI tract. Small bowel enteroscopyon 01/03/2018 revealed no actively bleeding lesions. The examined portion of the jejunum was normal.   Capsule studyon 01/03/2018 revealed a red lesion in the first 7% of the study likely representing duodenal erythema and nodularity noted on recent EGD. There was a red spot at 78% with subsequent small red blood strands which could represent an oozing AVM in the distal small bowel. He was felt most likley to have GI bleeding frommid to distal small bowel AVMs.  Chest CT on 02/15/2019 revealed advanced centrilobular and paraseptal emphysema.  The subpleural masslike density in the right upper lobe had nearly completely resolved with minimal residual scarring.  The small subpleural right middle lobe nodule was stable.  The small subpleural medial left upper lobe nodules was stable since recent study but had slowly enlarged in size since 2017. Recommend continued attention on follow-up imaging. There was coronary artery disease, aortic  atherosclerosis, and a 4 cm ascending thoracic aortic aneurysm.  Symptomatically, he feels "fine".  He gets "tired quickly".  He has had urinary retention and is s/p temporary Foley catheter placement.  Hematuria s/p catheter has resolved.  Plan: 1.   Review labs from 08/19/2019. 2.   Normocyticanemia Hematocrit 41.8.  Hemoglobin 14.6,and MCV 87.6. Ferritin 325.  Etiology of elevated ferritin felt secondary to acute phase reactant.  He is not taking oral iron. He has a history of gastritis, duodenitis, and small bowel AVMs. Continue to monitor. 3.   B12 deficiency He is on daily oral B12 1000 mcg supplementation. B12 level was 1357 on 02/12/2019. 4.   Folate deficiency He is on daily folic acid 1 mg/day. Folate was 41 on 08/19/2019. Monitor annually. 5.   Thrombocytopenia, resolved Platelets were 96,000 on 02/12/2019, 123,000 on 07/29/2019, and 200,000 on 08/19/2019. He has a history of H. pylori (treated). Continue surveillance. 6.   Abnormal chest CT             Chest CT on 02/15/2019 revealed stable to improved findings (see above).             Follow-up chest CT scheduled for 08/28/2019. 7.   Hematuria       Patient has a recent history of hematuria s/p catheterization.  Patient diagnosed with a UTI and treated.       Patient followed by urology (Dr Eliberto Ivory). 8.   RTC in 3 months for labs (CBC, iron stores). 9.   RTC in 6 months for MD assessment and labs (CBC with diff, ferritin).  Addendum:  Chest CT on 08/28/2019 revealed resolution of the RUL pleural-based density.  There was minimal residual scarring.  There were stable subcentimeter bilateral subpleural pulmonary nodules.  There was advanced centrilobular and paraseptal emphysema, with upper lobe predominance. The 4 cm ascending thoracic aortic aneurysm was stable.   I discussed the assessment and treatment plan with the patient.  The patient was provided an opportunity to ask questions and all were answered.  The patient  agreed with the plan and demonstrated an understanding of the  instructions.  The patient was advised to call back or seek an in person evaluation if the symptoms worsen or if the condition fails to improve as anticipated.  I provided 12 minutes (9:39 AM - 9:50 AM) of face-to-face video visit time during this this encounter and > 50% was spent counseling as documented under my assessment and plan.  I provided these services from the [facility] office.   Nolon Stalls, MD, PhD  08/21/2019, 9:39 AM  I, Selena Batten, am acting as scribe for [employer]. Mike Gip, MD, PhD.  I, Melissa C. Mike Gip, MD, have reviewed the above documentation for accuracy and completeness, and I agree with the above.

## 2019-08-21 ENCOUNTER — Inpatient Hospital Stay: Payer: Medicare Other | Attending: Hematology and Oncology | Admitting: Hematology and Oncology

## 2019-08-21 ENCOUNTER — Encounter: Payer: Self-pay | Admitting: Hematology and Oncology

## 2019-08-21 ENCOUNTER — Other Ambulatory Visit: Payer: Self-pay | Admitting: Urology

## 2019-08-21 DIAGNOSIS — E538 Deficiency of other specified B group vitamins: Secondary | ICD-10-CM

## 2019-08-21 DIAGNOSIS — R972 Elevated prostate specific antigen [PSA]: Secondary | ICD-10-CM

## 2019-08-21 DIAGNOSIS — D500 Iron deficiency anemia secondary to blood loss (chronic): Secondary | ICD-10-CM

## 2019-08-21 DIAGNOSIS — D696 Thrombocytopenia, unspecified: Secondary | ICD-10-CM

## 2019-08-21 NOTE — Progress Notes (Signed)
Confirmed Name, DOB, and Address. Assessment completed with assistance of wife. Denies any concerns.

## 2019-08-26 ENCOUNTER — Other Ambulatory Visit: Payer: Self-pay | Admitting: Physician Assistant

## 2019-08-28 ENCOUNTER — Other Ambulatory Visit: Payer: Self-pay

## 2019-08-28 ENCOUNTER — Ambulatory Visit
Admission: RE | Admit: 2019-08-28 | Discharge: 2019-08-28 | Disposition: A | Discharge status: Home / Self Care | Payer: Medicare Other | Source: Ambulatory Visit | Attending: Pulmonary Disease | Admitting: Pulmonary Disease

## 2019-08-28 DIAGNOSIS — R918 Other nonspecific abnormal finding of lung field: Secondary | ICD-10-CM | POA: Insufficient documentation

## 2019-08-30 ENCOUNTER — Other Ambulatory Visit: Payer: Self-pay

## 2019-08-30 DIAGNOSIS — Z20822 Contact with and (suspected) exposure to covid-19: Secondary | ICD-10-CM

## 2019-08-31 LAB — NOVEL CORONAVIRUS, NAA: SARS-CoV-2, NAA: NOT DETECTED

## 2019-09-02 ENCOUNTER — Ambulatory Visit: Payer: Medicare Other | Admitting: Pulmonary Disease

## 2019-09-02 ENCOUNTER — Encounter: Payer: Self-pay | Admitting: Pulmonary Disease

## 2019-09-02 ENCOUNTER — Other Ambulatory Visit: Payer: Self-pay

## 2019-09-02 VITALS — BP 136/78 | HR 95 | Temp 97.1°F | Ht 71.0 in | Wt 203.0 lb

## 2019-09-02 DIAGNOSIS — R918 Other nonspecific abnormal finding of lung field: Secondary | ICD-10-CM

## 2019-09-02 NOTE — Patient Instructions (Signed)
1.  The spot that was showing on the CT that he had previously in May has now resolved.  We will get you back to a once a year CT and that will be in December 2021.  2.  We will see you in a year's time call sooner should any new difficulties arise.

## 2019-09-03 ENCOUNTER — Other Ambulatory Visit: Payer: Self-pay

## 2019-09-03 ENCOUNTER — Ambulatory Visit
Admission: RE | Admit: 2019-09-03 | Discharge: 2019-09-03 | Disposition: A | Discharge status: Home / Self Care | Payer: Medicare Other | Source: Ambulatory Visit | Attending: Urology | Admitting: Urology

## 2019-09-03 DIAGNOSIS — R972 Elevated prostate specific antigen [PSA]: Secondary | ICD-10-CM | POA: Insufficient documentation

## 2019-09-03 MED ORDER — GADOBUTROL 1 MMOL/ML IV SOLN
9.0000 mL | Freq: Once | INTRAVENOUS | Status: AC | PRN
  Administered 2019-09-03: 9 mL via INTRAVENOUS

## 2019-09-05 ENCOUNTER — Other Ambulatory Visit: Payer: Medicare Other | Admitting: Urology

## 2019-09-16 ENCOUNTER — Other Ambulatory Visit: Payer: Self-pay

## 2019-09-16 ENCOUNTER — Encounter
Admission: RE | Admit: 2019-09-16 | Discharge: 2019-09-16 | Disposition: A | Discharge status: Home / Self Care | Payer: Medicare Other | Source: Ambulatory Visit | Attending: Urology | Admitting: Urology

## 2019-09-16 HISTORY — DX: Essential (primary) hypertension: I10

## 2019-09-16 HISTORY — DX: Unspecified cirrhosis of liver: K74.60

## 2019-09-16 HISTORY — DX: Gastro-esophageal reflux disease without esophagitis: K21.9

## 2019-09-16 HISTORY — DX: Chronic obstructive pulmonary disease, unspecified: J44.9

## 2019-09-16 NOTE — Patient Instructions (Addendum)
Your procedure is scheduled on: 09/26/19 Report to [facility]. To find out your arrival time please call [phone] between 1PM - 3PM on 09/25/19.  Remember: Instructions that are not followed completely may result in serious medical risk, up to and including death, or upon the discretion of your surgeon and anesthesiologist your surgery may need to be rescheduled.     _X__ 1. Do not eat food after midnight the night before your procedure.                 No gum chewing or hard candies. You may drink clear liquids up to 2 hours                 before you are scheduled to arrive for your surgery- DO not drink clear                 liquids within 2 hours of the start of your surgery.                 Clear Liquids include:  water, apple juice without pulp, clear carbohydrate                 drink such as Clearfast or Gatorade, Black Coffee or Tea (Do not add                 anything to coffee or tea). Diabetics water only  __X__2.  On the morning of surgery brush your teeth with toothpaste and water, you                 may rinse your mouth with mouthwash if you wish.  Do not swallow any              toothpaste of mouthwash.     _X__ 3.  No Alcohol for 24 hours before or after surgery.   _X__ 4.  Do Not Smoke or use e-cigarettes For 24 Hours Prior to Your Surgery.                 Do not use any chewable tobacco products for at least 6 hours prior to                 surgery.  ____  5.  Bring all medications with you on the day of surgery if instructed.   __X__  6.  Notify your doctor if there is any change in your medical condition      (cold, fever, infections).     Do not wear jewelry, make-up, hairpins, clips or nail polish. Do not wear lotions, powders, or perfumes.  Do not shave 48 hours prior to surgery. Men may shave face and neck. Do not bring valuables to the hospital.    [facility] is not responsible for any belongings or  valuables.  Contacts, dentures/partials or body piercings may not be worn into surgery. Bring a case for your contacts, glasses or hearing aids, a denture cup will be supplied. Leave your suitcase in the car. After surgery it may be brought to your room. For patients admitted to the hospital, discharge time is determined by your treatment team.   Patients discharged the day of surgery will not be allowed to drive home.   Please read over the following fact sheets that you were given:   MRSA Information  __X__ Take these medicines the morning of surgery with A SIP OF WATER:  1. cetirizine  2. dutasteride  3. pantoprazole  4. rosuvastatin  5.  6.  ____ Fleet Enema (as directed)   ____ Use CHG Soap/SAGE wipes as directed  ____ Use inhalers on the day of surgery  ____ Stop metformin/Janumet/Farxiga 2 days prior to surgery    ____ Take 1/2 of usual insulin dose the night before surgery. No insulin the morning          of surgery.   ____ Stop Blood Thinners Coumadin/Plavix/Xarelto/Pleta/Pradaxa/Eliquis/Effient/Aspirin  on   Or contact your Surgeon, Cardiologist or Medical Doctor regarding  ability to stop your blood thinners  __X__ Stop Anti-inflammatories 7 days before surgery such as Advil, Ibuprofen, Motrin,  BC or Goodies Powder, Naprosyn, Naproxen, Aleve, Aspirin    __X__ Stop all herbal supplements, fish oil or vitamin E until after surgery.    ____ Bring C-Pap to the hospital.

## 2019-09-16 NOTE — H&P (Signed)
NAME: Dwayne Thompson, Dwayne Thompson MEDICAL RECORD [id number] ACCOUNT [id number] DATE OF BIRTH:[birth year] FACILITY: [facility] LOCATION: [facility] PHYSICIAN:Krystianna Soth Farrel Conners, MD  HISTORY AND PHYSICAL  DATE OF ADMISSION:  09/26/2019  CHIEF COMPLAINT:  Urinary retention.  HISTORY OF PRESENT ILLNESS:  The patient is a 73 year old African-American male who developed acute urinary retention requiring Foley catheter placement in November.  He is no longer in retention, but has had significant lower urinary tract symptoms.  He  is currently on dutasteride and tamsulosin.  IPSS score was 24 with a quality of life score of 6.  He was also found to have an elevated PSA of 12.1 and underwent MRI scan, which did not reveal any high-grade PI-RADS lesions, but rather significant  large prostate with a volume of 86 mL.  The patient comes in now for photovaporization of prostate with the GreenLight laser.  ALLERGIES:  ALLERGIC TO SULFA.  CURRENT MEDICATIONS:  Tylenol, aspirin, Zyrtec, vitamin D, vitamin B12, Flexeril, tamsulosin, dutasteride, folic acid, lisinopril, montelukast, pantoprazole, potassium chloride, rosuvastatin, tadalafil and Valtrex.  PAST SURGICAL HISTORY:   1.  Cystoscopy 2019. 2.  Upper endoscopy 2019.   3.  Right inguinal herniorrhaphy 1980s.  PAST 1.  Chronic anemia. 2.  Chronic low back pain. 3.  Erectile dysfunction. 4.  Hyperlipidemia. 5.  COPD. 6.  Hypertension.  REVIEW OF SYSTEMS:  The patient denies chest pain, shortness of breath, diabetes, stroke or heart disease.  SOCIAL HISTORY:  The patient quit smoking in 2014 with a 30-pack-year history.  He consumes 6 alcoholic beverages per week.  FAMILY HISTORY:  There is no family history of prostate cancer.  PHYSICAL EXAMINATION: VITAL SIGNS:  Height 5 feet 9 inches, weight 204, BMI 30. GENERAL:  A well-nourished African-American male in no distress. HEENT:  Sclerae were clear.  Pupils are equally round, reactive to light and  accommodation.  Extraocular movements are intact. NECK:  No palpable cervical adenopathy. PULMONARY:  Lungs clear to auscultation. CARDIOVASCULAR:  Regular rhythm and rate without audible murmurs. ABDOMEN:  Soft, nontender abdomen. GENITOURINARY:  He was circumcised.  Testes were smooth, nontender, 18 mL in size each. RECTAL:  Greater than 40 g smooth, nontender prostate. NEUROMUSCULAR:  Alert and oriented x3.  IMPRESSION: 1.  Benign prostatic hypertrophy with bladder outlet obstruction. 2.  Recent urinary retention. 3.  Elevated PSA due to significant BPH and recent urinary retention.  PLAN:  Photovaporization of prostate with GreenLight laser.  PN/NUANCE  D:09/16/2019 T:09/16/2019 JOB:[id number]

## 2019-09-17 ENCOUNTER — Other Ambulatory Visit: Payer: Medicare Other

## 2019-09-17 ENCOUNTER — Encounter
Admission: RE | Admit: 2019-09-17 | Discharge: 2019-09-17 | Disposition: A | Discharge status: Home / Self Care | Payer: Medicare Other | Source: Ambulatory Visit | Attending: Urology | Admitting: Urology

## 2019-09-17 DIAGNOSIS — Z01810 Encounter for preprocedural cardiovascular examination: Secondary | ICD-10-CM

## 2019-09-17 DIAGNOSIS — I10 Essential (primary) hypertension: Secondary | ICD-10-CM | POA: Insufficient documentation

## 2019-09-17 DIAGNOSIS — R9431 Abnormal electrocardiogram [ECG] [EKG]: Secondary | ICD-10-CM | POA: Diagnosis not present

## 2019-09-17 NOTE — Pre-Procedure Instructions (Signed)
EKG reviewed by Dr Randa Lynn. No new orders.

## 2019-09-24 ENCOUNTER — Ambulatory Visit: Payer: Medicare Other | Admitting: Urology

## 2019-09-24 ENCOUNTER — Other Ambulatory Visit
Admission: RE | Admit: 2019-09-24 | Discharge: 2019-09-24 | Disposition: A | Discharge status: Home / Self Care | Payer: Medicare Other | Source: Ambulatory Visit | Attending: Urology | Admitting: Urology

## 2019-09-24 ENCOUNTER — Other Ambulatory Visit: Payer: Self-pay

## 2019-09-24 DIAGNOSIS — Z01812 Encounter for preprocedural laboratory examination: Secondary | ICD-10-CM | POA: Diagnosis present

## 2019-09-24 DIAGNOSIS — Z20822 Contact with and (suspected) exposure to covid-19: Secondary | ICD-10-CM | POA: Diagnosis not present

## 2019-09-24 LAB — SARS CORONAVIRUS 2 (TAT 6-24 HRS): SARS Coronavirus 2: NEGATIVE

## 2019-09-26 ENCOUNTER — Encounter: Payer: Self-pay | Admitting: Urology

## 2019-09-26 ENCOUNTER — Ambulatory Visit: Payer: Medicare Other | Admitting: Anesthesiology

## 2019-09-26 ENCOUNTER — Ambulatory Visit
Admission: RE | Admit: 2019-09-26 | Discharge: 2019-09-26 | Disposition: A | Discharge status: Home / Self Care | Payer: Medicare Other | Attending: Urology | Admitting: Urology

## 2019-09-26 ENCOUNTER — Encounter
Admission: RE | Disposition: A | Discharge status: Home / Self Care | Payer: Self-pay | Source: Home / Self Care | Attending: Urology

## 2019-09-26 ENCOUNTER — Other Ambulatory Visit: Payer: Self-pay

## 2019-09-26 DIAGNOSIS — E785 Hyperlipidemia, unspecified: Secondary | ICD-10-CM | POA: Insufficient documentation

## 2019-09-26 DIAGNOSIS — J449 Chronic obstructive pulmonary disease, unspecified: Secondary | ICD-10-CM | POA: Diagnosis not present

## 2019-09-26 DIAGNOSIS — K7460 Unspecified cirrhosis of liver: Secondary | ICD-10-CM | POA: Diagnosis not present

## 2019-09-26 DIAGNOSIS — I712 Thoracic aortic aneurysm, without rupture: Secondary | ICD-10-CM | POA: Diagnosis not present

## 2019-09-26 DIAGNOSIS — M545 Low back pain: Secondary | ICD-10-CM | POA: Diagnosis not present

## 2019-09-26 DIAGNOSIS — Z7982 Long term (current) use of aspirin: Secondary | ICD-10-CM | POA: Insufficient documentation

## 2019-09-26 DIAGNOSIS — N320 Bladder-neck obstruction: Secondary | ICD-10-CM | POA: Diagnosis not present

## 2019-09-26 DIAGNOSIS — G8929 Other chronic pain: Secondary | ICD-10-CM | POA: Insufficient documentation

## 2019-09-26 DIAGNOSIS — I10 Essential (primary) hypertension: Secondary | ICD-10-CM | POA: Insufficient documentation

## 2019-09-26 DIAGNOSIS — Z87891 Personal history of nicotine dependence: Secondary | ICD-10-CM | POA: Insufficient documentation

## 2019-09-26 DIAGNOSIS — K5730 Diverticulosis of large intestine without perforation or abscess without bleeding: Secondary | ICD-10-CM | POA: Diagnosis not present

## 2019-09-26 DIAGNOSIS — N401 Enlarged prostate with lower urinary tract symptoms: Secondary | ICD-10-CM | POA: Insufficient documentation

## 2019-09-26 DIAGNOSIS — K219 Gastro-esophageal reflux disease without esophagitis: Secondary | ICD-10-CM | POA: Insufficient documentation

## 2019-09-26 DIAGNOSIS — E538 Deficiency of other specified B group vitamins: Secondary | ICD-10-CM | POA: Diagnosis not present

## 2019-09-26 DIAGNOSIS — Z79899 Other long term (current) drug therapy: Secondary | ICD-10-CM | POA: Insufficient documentation

## 2019-09-26 DIAGNOSIS — N400 Enlarged prostate without lower urinary tract symptoms: Secondary | ICD-10-CM

## 2019-09-26 DIAGNOSIS — N529 Male erectile dysfunction, unspecified: Secondary | ICD-10-CM | POA: Diagnosis not present

## 2019-09-26 DIAGNOSIS — N138 Other obstructive and reflux uropathy: Secondary | ICD-10-CM

## 2019-09-26 DIAGNOSIS — D649 Anemia, unspecified: Secondary | ICD-10-CM | POA: Diagnosis not present

## 2019-09-26 DIAGNOSIS — R338 Other retention of urine: Secondary | ICD-10-CM | POA: Diagnosis not present

## 2019-09-26 HISTORY — PX: GREEN LIGHT LASER TURP (TRANSURETHRAL RESECTION OF PROSTATE: SHX6260

## 2019-09-26 HISTORY — PX: CYSTOSCOPY WITH STENT PLACEMENT: SHX5790

## 2019-09-26 SURGERY — GREEN LIGHT LASER TURP (TRANSURETHRAL RESECTION OF PROSTATE
Anesthesia: General | Site: Ureter | Laterality: Right

## 2019-09-26 MED ORDER — OXYCODONE-ACETAMINOPHEN 5-325 MG PO TABS: 1.0000 | ORAL_TABLET | ORAL | 0 refills | Status: DC | PRN

## 2019-09-26 MED ORDER — ROCURONIUM BROMIDE 50 MG/5ML IV SOLN
INTRAVENOUS | Status: AC
  Filled 2019-09-26: qty 2

## 2019-09-26 MED ORDER — ONDANSETRON HCL 4 MG/2ML IJ SOLN
INTRAMUSCULAR | Status: AC
  Filled 2019-09-26: qty 2

## 2019-09-26 MED ORDER — SUGAMMADEX SODIUM 200 MG/2ML IV SOLN
INTRAVENOUS | Status: AC
  Filled 2019-09-26: qty 2

## 2019-09-26 MED ORDER — LIDOCAINE HCL URETHRAL/MUCOSAL 2 % EX GEL
CUTANEOUS | Status: AC
  Filled 2019-09-26: qty 10

## 2019-09-26 MED ORDER — BELLADONNA ALKALOIDS-OPIUM 16.2-60 MG RE SUPP
RECTAL | Status: DC | PRN
  Administered 2019-09-26: 1 via RECTAL

## 2019-09-26 MED ORDER — IPRATROPIUM-ALBUTEROL 0.5-2.5 (3) MG/3ML IN SOLN
RESPIRATORY_TRACT | Status: AC
  Administered 2019-09-26: 3 mL via RESPIRATORY_TRACT
  Filled 2019-09-26: qty 3

## 2019-09-26 MED ORDER — ROCURONIUM BROMIDE 100 MG/10ML IV SOLN
INTRAVENOUS | Status: DC | PRN
  Administered 2019-09-26: 20 mg via INTRAVENOUS
  Administered 2019-09-26: 10 mg via INTRAVENOUS
  Administered 2019-09-26: 50 mg via INTRAVENOUS
  Administered 2019-09-26: 10 mg via INTRAVENOUS

## 2019-09-26 MED ORDER — FENTANYL CITRATE (PF) 100 MCG/2ML IJ SOLN: 25.0000 ug | INTRAMUSCULAR | Status: DC | PRN

## 2019-09-26 MED ORDER — SILVER NITRATE-POT NITRATE 75-25 % EX MISC: CUTANEOUS | Status: DC | PRN

## 2019-09-26 MED ORDER — LEVOFLOXACIN IN D5W 500 MG/100ML IV SOLN
500.0000 mg | Freq: Once | INTRAVENOUS | Status: AC
  Administered 2019-09-26: 500 mg via INTRAVENOUS

## 2019-09-26 MED ORDER — PROPOFOL 10 MG/ML IV BOLUS
INTRAVENOUS | Status: DC | PRN
  Administered 2019-09-26: 120 mg via INTRAVENOUS

## 2019-09-26 MED ORDER — DOCUSATE SODIUM 100 MG PO CAPS: 200.0000 mg | ORAL_CAPSULE | Freq: Two times a day (BID) | ORAL | 3 refills | Status: DC

## 2019-09-26 MED ORDER — OXYCODONE HCL 5 MG/5ML PO SOLN: 5.0000 mg | Freq: Once | ORAL | Status: DC | PRN

## 2019-09-26 MED ORDER — DEXMEDETOMIDINE HCL 200 MCG/2ML IV SOLN
INTRAVENOUS | Status: DC | PRN
  Administered 2019-09-26 (×2): 8 ug via INTRAVENOUS

## 2019-09-26 MED ORDER — BELLADONNA ALKALOIDS-OPIUM 16.2-60 MG RE SUPP
RECTAL | Status: AC
  Filled 2019-09-26: qty 1

## 2019-09-26 MED ORDER — DEXMEDETOMIDINE HCL IN NACL 80 MCG/20ML IV SOLN
INTRAVENOUS | Status: AC
  Filled 2019-09-26: qty 20

## 2019-09-26 MED ORDER — DEXAMETHASONE SODIUM PHOSPHATE 10 MG/ML IJ SOLN
INTRAMUSCULAR | Status: AC
  Filled 2019-09-26: qty 1

## 2019-09-26 MED ORDER — FENTANYL CITRATE (PF) 100 MCG/2ML IJ SOLN
INTRAMUSCULAR | Status: AC
  Filled 2019-09-26: qty 2

## 2019-09-26 MED ORDER — IPRATROPIUM-ALBUTEROL 0.5-2.5 (3) MG/3ML IN SOLN: 3.0000 mL | Freq: Once | RESPIRATORY_TRACT | Status: AC

## 2019-09-26 MED ORDER — ONDANSETRON HCL 4 MG/2ML IJ SOLN
INTRAMUSCULAR | Status: DC | PRN
  Administered 2019-09-26: 4 mg via INTRAVENOUS

## 2019-09-26 MED ORDER — CIPROFLOXACIN HCL 500 MG PO TABS: 500.0000 mg | ORAL_TABLET | Freq: Two times a day (BID) | ORAL | 0 refills | Status: DC

## 2019-09-26 MED ORDER — SILVER NITRATE-POT NITRATE 75-25 % EX MISC
CUTANEOUS | Status: AC
  Filled 2019-09-26: qty 20

## 2019-09-26 MED ORDER — LEVOFLOXACIN IN D5W 500 MG/100ML IV SOLN
INTRAVENOUS | Status: AC
  Filled 2019-09-26: qty 100

## 2019-09-26 MED ORDER — DEXAMETHASONE SODIUM PHOSPHATE 10 MG/ML IJ SOLN
INTRAMUSCULAR | Status: DC | PRN
  Administered 2019-09-26: 10 mg via INTRAVENOUS

## 2019-09-26 MED ORDER — SODIUM CHLORIDE (PF) 0.9 % IJ SOLN
INTRAMUSCULAR | Status: AC
  Filled 2019-09-26: qty 10

## 2019-09-26 MED ORDER — LIDOCAINE HCL (CARDIAC) PF 100 MG/5ML IV SOSY
PREFILLED_SYRINGE | INTRAVENOUS | Status: DC | PRN
  Administered 2019-09-26: 80 mg via INTRAVENOUS

## 2019-09-26 MED ORDER — SUGAMMADEX SODIUM 200 MG/2ML IV SOLN
INTRAVENOUS | Status: DC | PRN
  Administered 2019-09-26: 200 mg via INTRAVENOUS

## 2019-09-26 MED ORDER — LACTATED RINGERS IV SOLN: INTRAVENOUS | Status: DC

## 2019-09-26 MED ORDER — OXYCODONE HCL 5 MG PO TABS: 5.0000 mg | ORAL_TABLET | Freq: Once | ORAL | Status: DC | PRN

## 2019-09-26 MED ORDER — LIDOCAINE HCL URETHRAL/MUCOSAL 2 % EX GEL
CUTANEOUS | Status: DC | PRN
  Administered 2019-09-26: 1 via URETHRAL

## 2019-09-26 MED ORDER — LIDOCAINE HCL (PF) 2 % IJ SOLN
INTRAMUSCULAR | Status: AC
  Filled 2019-09-26: qty 5

## 2019-09-26 MED ORDER — FENTANYL CITRATE (PF) 100 MCG/2ML IJ SOLN
INTRAMUSCULAR | Status: DC | PRN
  Administered 2019-09-26 (×4): 50 ug via INTRAVENOUS

## 2019-09-26 MED ORDER — PROPOFOL 10 MG/ML IV BOLUS
INTRAVENOUS | Status: AC
  Filled 2019-09-26: qty 20

## 2019-09-26 MED ORDER — URIBEL 118 MG PO CAPS: 1.0000 | ORAL_CAPSULE | Freq: Four times a day (QID) | ORAL | 3 refills | Status: DC | PRN

## 2019-09-26 SURGICAL SUPPLY — 23 items
ADAPTER IRRIG TUBE 2 SPIKE SOL (ADAPTER) ×8 IMPLANT
BAG URINE DRAIN 2000ML AR STRL (UROLOGICAL SUPPLIES) ×4 IMPLANT
CATH FOLEY 2WAY  5CC 20FR SIL (CATHETERS) ×2
CATH FOLEY 2WAY 5CC 20FR SIL (CATHETERS) IMPLANT
GLIDEWIRE STR 0.035 150CM 3CM (WIRE) ×2 IMPLANT
GLOVE BIO SURGEON STRL SZ7.5 (GLOVE) ×4 IMPLANT
GOWN STRL REUS W/ TWL LRG LVL3 (GOWN DISPOSABLE) ×2 IMPLANT
GOWN STRL REUS W/ TWL XL LVL3 (GOWN DISPOSABLE) ×2 IMPLANT
GOWN STRL REUS W/TWL LRG LVL3 (GOWN DISPOSABLE) ×2
GOWN STRL REUS W/TWL XL LVL3 (GOWN DISPOSABLE) ×2
HOLDER FOLEY CATH W/STRAP (MISCELLANEOUS) ×2 IMPLANT
IV NS 1000ML (IV SOLUTION) ×2
IV NS 1000ML BAXH (IV SOLUTION) ×2 IMPLANT
IV SET PRIMARY 15D 139IN B9900 (IV SETS) ×4 IMPLANT
KIT TURNOVER CYSTO (KITS) ×4 IMPLANT
PACK CYSTO AR (MISCELLANEOUS) ×4 IMPLANT
SET IRRIG Y TYPE TUR BLADDER L (SET/KITS/TRAYS/PACK) ×4 IMPLANT
SOL .9 NS 3000ML IRR  AL (IV SOLUTION) ×16
SOL .9 NS 3000ML IRR UROMATIC (IV SOLUTION) ×8 IMPLANT
STENT URET 6FRX26 CONTOUR (STENTS) ×2 IMPLANT
SURGILUBE 2OZ TUBE FLIPTOP (MISCELLANEOUS) ×4 IMPLANT
SYRINGE IRR TOOMEY STRL 70CC (SYRINGE) ×4 IMPLANT
WATER STERILE IRR 1000ML POUR (IV SOLUTION) ×4 IMPLANT

## 2019-09-26 NOTE — Transfer of Care (Signed)
Immediate Anesthesia Transfer of Care Note  Patient: Dwayne Thompson  Procedure(s) Performed: GREEN LIGHT LASER TURP (TRANSURETHRAL RESECTION OF PROSTATE (N/A Prostate) CYSTOSCOPY WITH STENT PLACEMENT (Right Ureter)  Patient Location: PACU  Anesthesia Type:General  Level of Consciousness: sedated  Airway & Oxygen Therapy: Patient Spontanous Breathing and Patient connected to face mask oxygen  Post-op Assessment: Report given to RN and Post -op Vital signs reviewed and stable  Post vital signs: Reviewed and stable  Last Vitals:  Vitals Value Taken Time  BP 114/69 09/26/19 1013  Temp    Pulse 89 09/26/19 1015  Resp 17 09/26/19 1015  SpO2 99 % 09/26/19 1015  Vitals shown include unvalidated device data.  Last Pain:  Vitals:   09/26/19 0621  TempSrc: Tympanic  PainSc: 0-No pain         Complications: No apparent anesthesia complications

## 2019-09-26 NOTE — Op Note (Signed)
Preoperative diagnosis: 1.  BPH with bladder outlet obstruction                                            2.  Urinary retention                                            3.  Elevated PSA  Postoperative diagnosis: Same  Procedure: 1.  Photo vaporization of the prostate with greenlight laser                      2.  Right double-pigtail ureteral stent placement  Surgeon: Otelia Limes. Yves Dill MD  Anesthesia: General  Indications:See the history and physical. After informed consent the above procedure(s) were requested     Technique and findings: After adequate general anesthesia been obtained patient was placed into dorsal lithotomy position and the perineum was prepped and draped in the usual fashion.  The laser scope was coupled to the camera and visually advanced into the bladder.  Bladder was thoroughly inspected.  The bladder was heavily trabeculated but no bladder tumors were identified.  There were two 1 mm bladder stones present.  Trilobar BPH was present with intravesical growth of the prostate posteriorly over the trigone.  At this point the greenlight XPS laser fiber was introduced through the scope and set at 80 W of power. Bladder neck tissue was vaporized.  Power was then increased to 120 W and tissue from the bladder neck to the verumontanum was vaporized.  There was then increased to 180 W and remaining obstructive tissue vaporized.  Section of the trigone indicated that there was some cautery/ thermal effect at the area of the right ureteral orifice.  Therefore a 0.035 Glidewire was passed up the right ureter under fluoroscopic guidance.  A 6 French by 26 cm double-pigtail ureteral stent was then advanced over the guidewire and positioned in the ureter.  Guidewire was then removed taking care leave the sent in position.  At this point bleeders were controlled with the coagulative setting.  The laser scope was then removed and 10 cc of viscous Xylocaine instilled within the urethra and the  bladder.  Estimated prostate section size was 90 cc.  Estimated blood loss was 200 cc.  20 French silicone Foley catheter was placed and irrigated until clear.  A B&O suppository was placed.  The procedure was then terminated and patient transferred to the recovery room in stable condition.`

## 2019-09-26 NOTE — Anesthesia Procedure Notes (Signed)
Procedure Name: Intubation Date/Time: 09/26/2019 7:34 AM Performed by: Caryl Asp, CRNA Pre-anesthesia Checklist: Patient identified, Patient being monitored, Timeout performed, Emergency Drugs available and Suction available Patient Re-evaluated:Patient Re-evaluated prior to induction Oxygen Delivery Method: Circle system utilized Preoxygenation: Pre-oxygenation with 100% oxygen Induction Type: IV induction Ventilation: Mask ventilation without difficulty Laryngoscope Size: Mac and 4 Grade View: Grade II Tube type: Oral Tube size: 7.5 mm Number of attempts: 1 Airway Equipment and Method: Stylet Placement Confirmation: ETT inserted through vocal cords under direct vision,  positive ETCO2 and breath sounds checked- equal and bilateral Secured at: 24 cm Tube secured with: Tape Dental Injury: Teeth and Oropharynx as per pre-operative assessment

## 2019-09-26 NOTE — H&P (Signed)
Date of Initial H&P: 09/16/19  History reviewed, patient examined, no change in status, stable for surgery.

## 2019-09-26 NOTE — Progress Notes (Signed)
Pt and wife taught how to irrigate and dc foley,also how to empty it, They verbalize understanding of these instructions

## 2019-09-26 NOTE — Discharge Instructions (Signed)
Benign Prostatic Hyperplasia  Benign prostatic hyperplasia (BPH) is an enlarged prostate gland that is caused by the normal aging process and not by cancer. The prostate is a walnut-sized gland that is involved in the production of semen. It is located in front of the rectum and below the bladder. The bladder stores urine and the urethra is the tube that carries the urine out of the body. The prostate may get bigger as a man gets older. An enlarged prostate can press on the urethra. This can make it harder to pass urine. The build-up of urine in the bladder can cause infection. Back pressure and infection may progress to bladder damage and kidney (renal) failure. What are the causes? This condition is part of a normal aging process. However, not all men develop problems from this condition. If the prostate enlarges away from the urethra, urine flow will not be blocked. If it enlarges toward the urethra and compresses it, there will be problems passing urine. What increases the risk? This condition is more likely to develop in men over the age of 64 years. What are the signs or symptoms? Symptoms of this condition include:  Getting up often during the night to urinate.  Needing to urinate frequently during the day.  Difficulty starting urine flow.  Decrease in size and strength of your urine stream.  Leaking (dribbling) after urinating.  Inability to pass urine. This needs immediate treatment.  Inability to completely empty your bladder.  Pain when you pass urine. This is more common if there is also an infection.  Urinary tract infection (UTI). How is this diagnosed? This condition is diagnosed based on your medical history, a physical exam, and your symptoms. Tests will also be done, such as:  A post-void bladder scan. This measures any amount of urine that may remain in your bladder after you finish urinating.  A digital rectal exam. In a rectal exam, your health care provider  checks your prostate by putting a lubricated, gloved finger into your rectum to feel the back of your prostate gland. This exam detects the size of your gland and any abnormal lumps or growths.  An exam of your urine (urinalysis).  A prostate specific antigen (PSA) screening. This is a blood test used to screen for prostate cancer.  An ultrasound. This test uses sound waves to electronically produce a picture of your prostate gland. Your health care provider may refer you to a specialist in kidney and prostate diseases (urologist). How is this treated? Once symptoms begin, your health care provider will monitor your condition (active surveillance or watchful waiting). Treatment for this condition will depend on the severity of your condition. Treatment may include:  Observation and yearly exams. This may be the only treatment needed if your condition and symptoms are mild.  Medicines to relieve your symptoms, including: ? Medicines to shrink the prostate. ? Medicines to relax the muscle of the prostate.  Surgery in severe cases. Surgery may include: ? Prostatectomy. In this procedure, the prostate tissue is removed completely through an open incision or with a laparoscope or robotics. ? Transurethral resection of the prostate (TURP). In this procedure, a tool is inserted through the opening at the tip of the penis (urethra). It is used to cut away tissue of the inner core of the prostate. The pieces are removed through the same opening of the penis. This removes the blockage. ? Transurethral incision (TUIP). In this procedure, small cuts are made in the prostate. This lessens  the prostate's pressure on the urethra. ? Transurethral microwave thermotherapy (TUMT). This procedure uses microwaves to create heat. The heat destroys and removes a small amount of prostate tissue. ? Transurethral needle ablation (TUNA). This procedure uses radio frequencies to destroy and remove a small amount of  prostate tissue. ? Interstitial laser coagulation ([place]). This procedure uses a laser to destroy and remove a small amount of prostate tissue. ? Transurethral electrovaporization (TUVP). This procedure uses electrodes to destroy and remove a small amount of prostate tissue. ? Prostatic urethral lift. This procedure inserts an implant to push the lobes of the prostate away from the urethra. Follow these instructions at home:  Take over-the-counter and prescription medicines only as told by your health care provider.  Monitor your symptoms for any changes. Contact your health care provider with any changes.  Avoid drinking large amounts of liquid before going to bed or out in public.  Avoid or reduce how much caffeine or alcohol you drink.  Give yourself time when you urinate.  Keep all follow-up visits as told by your health care provider. This is important. Contact a health care provider if:  You have unexplained back pain.  Your symptoms do not get better with treatment.  You develop side effects from the medicine you are taking.  Your urine becomes very dark or has a bad smell.  Your lower abdomen becomes distended and you have trouble passing your urine. Get help right away if:  You have a fever or chills.  You suddenly cannot urinate.  You feel lightheaded, or very dizzy, or you faint.  There are large amounts of blood or clots in the urine.  Your urinary problems become hard to manage.  You develop moderate to severe low back or flank pain. The flank is the side of your body between the ribs and the hip. These symptoms may represent a serious problem that is an emergency. Do not wait to see if the symptoms will go away. Get medical help right away. Call your local emergency services (911 in the [place] Do not drive yourself to the hospital. Summary  Benign prostatic hyperplasia (BPH) is an enlarged prostate that is caused by the normal aging process and not by  cancer.  An enlarged prostate can press on the urethra. This can make it hard to pass urine.  This condition is part of a normal aging process and is more likely to develop in men over the age of 3 years.  Get help right away if you suddenly cannot urinate. This information is not intended to replace advice given to you by your health care provider. Make sure you discuss any questions you have with your health care provider. Document Revised: 07/31/2018 Document Reviewed: 10/10/2016 Elsevier Patient Education  [place] Laser Prostate Treatment  Green light laser therapy is a procedure that uses a high-energy laser to get rid of extra prostate tissue by turning the tissue into a vapor. It is less invasive than traditional methods of prostate surgery, which involve cutting out the prostate tissue. Because the tissue is turned into a vapor (vaporized) rather than cut out, there is generally less blood loss. This surgery is used to treat an enlarged prostate gland (benign prostatic hyperplasia). Tell a health care provider about:  Any allergies you have.  All medicines you are taking, including vitamins, herbs, eye drops, creams, and over-the-counter medicines.  Any problems you or family members have had with anesthetic medicines.  Any  blood disorders you have.  Any surgeries you have had.  Any medical conditions you have. What are the risks? Generally, this is a safe procedure. However, problems may occur, including:  Infection.  Bleeding.  Allergic reaction to medicines.  Damage to other structures or organs.  Blood in the urine (hematuria).  Painful urination.  Urinary tract infection.  Erectile dysfunction (rare).  Dry ejaculation.  Scar tissue in the urinary passage. What happens before the procedure? Staying hydrated Follow instructions from your health care provider about hydration, which may include:  Up to 2 hours before the  procedure - you may continue to drink clear liquids, such as water, clear fruit juice, black coffee, and plain tea. Eating and drinking restrictions Follow instructions from your health care provider about eating and drinking, which may include:  8 hours before the procedure - stop eating heavy meals or foods such as meat, fried foods, or fatty foods.  6 hours before the procedure - stop eating light meals or foods, such as toast or cereal.  6 hours before the procedure - stop drinking milk or drinks that contain milk.  2 hours before the procedure - stop drinking clear liquids. Medicines  Ask your health care provider about: ? Changing or stopping your regular medicines. This is especially important if you are taking diabetes medicines or blood thinners. ? Taking medicines such as aspirin and ibuprofen. These medicines can thin your blood. Do not take these medicines before your procedure if your health care provider instructs you not to.  You may be prescribed antibiotic medicine. If so, take your antibiotic as told by your health care provider. Do not stop taking the antibiotic even if you start to feel better. General instructions  Plan to have someone take you home from the hospital or clinic.  If you will be going home right after the procedure, plan to have someone with you for 24 hours. What happens during the procedure?  To reduce your risk of infection: ? Your health care team will wash or sanitize their hands. ? Your skin will be washed with soap.  An IV will be inserted into one of your veins.  You will be given one or more of the following: ? A medicine to help you relax (sedative). ? A medicine to make you fall asleep (general anesthetic). ? A medicine that is injected into your spine to numb the area below and slightly above the injection site (spinal anesthetic).  A tube containing viewing scopes and instruments (fiber-optic scope) will be passed through the  urethra and into the prostate. The opening of the urethra is at the end of the penis.  A thin fiber will be put through the tube and positioned next to the extra prostate tissue.  Pulses of laser light will come from the end of the fiber and be projected onto the extra tissue. Your blood will absorb the light, become hot, and vaporize the extra prostate tissue.  The heat from the laser beam will seal off the blood vessels, which will lessen bleeding.  The fiber-optic scope will be removed.  A thin tube will be inserted into the urethra to drain your urine (urinary catheter). The procedure may vary among health care providers and hospitals. What happens after the procedure?  Your blood pressure, heart rate, breathing rate, and blood oxygen level will be monitored until the medicines you were given have worn off.  Depending on factors such as the amount of prostate tissue that was  vaporized, the strength of your bladder, and the amount of bleeding expected, your catheter may be removed before you go home.  You may be given elastic support stockings to wear to help prevent blood clots in your legs.  Do not drive for 24 hours if you were given a sedative, or for as long as directed by your health care provider. Summary  Green light laser therapy is a procedure that uses a high-energy laser that turns extra prostate tissue into a vapor.  This procedure is less invasive than traditional methods of prostate surgery.  Follow instructions from your health care provider about eating and drinking before the procedure.  Pulses of laser light will come from the end of a thin fiber and be aimed at the extra prostate tissue. Your blood will absorb the light, become hot, and vaporize the extra tissue. This information is not intended to replace advice given to you by your health care provider. Make sure you discuss any questions you have with your health care provider. Document Revised: 12/27/2018  Document Reviewed: 09/24/2016 Elsevier Patient Education  [place] After This sheet gives you information about how to care for yourself after your procedure. Your health care provider may also give you more specific instructions. If you have problems or questions, contact your health care provider. What can I expect after the procedure? After the procedure, it is common to have:  Swelling and discomfort around your urethra. The opening of the urethra is at the end of the penis.  Blood in your urine. This should go away after a few days.  Trouble urinating or sudden need to urinate (urgency). These problems should get better over time. You may continue to have a thin tube (catheter) inserted into your urethra to help drain your urine from your bladder for a few days after the procedure. Follow these instructions at home: Medicines  Take over-the-counter and prescription medicines only as told by your health care provider.  If you were prescribed an antibiotic medicine, take it as told by your health care provider. Do not stop taking the antibiotic even if you start to feel better. Bathing  Do not take baths, swim, or use a hot tub until your health care provider approves. Ask your health care provider if you may take showers. You may only be allowed to take sponge baths. Activity   Do not drive for 24 hours if you were given a medicine to help you relax (sedative) during your procedure.  Do not drive or use heavy machinery while taking prescription pain medicine.  Ask your health care provider what activities are safe for you. Most people can return to normal activities within a few days. ? Do not have sex or engage in sexual activity until your health care provider approves. ? Do not lift anything that is heavier than 10 lb (4.5 kg), or the limit that you are told, until your health care provider says that it is safe. General  instructions      If you have a urinary catheter, care for it as told by your health care provider. This may include: ? Washing your hands before and after touching the catheter. ? Emptying your drainage bag when it is ?- full, or emptying it at least 2-3 times a day. ? Keeping the area around the catheter clean and dry. ? Avoiding any bends or breaks in the catheter. ? Keeping air out of the catheter. ?  Making sure that the catheter is not placed under water.  Do not use any products that contain nicotine or tobacco, such as cigarettes and e-cigarettes. If you need help quitting, ask your health care provider.  Drink enough fluid to keep your urine pale yellow.  Keep all follow-up visits as told by your health care provider. This is important. Contact a health care provider if:  You have trouble: ? Having a bowel movement. ? Getting an erection.  You have swelling around your urethra and it gets worse.  You have blood in your urine for more than 2 days after the procedure.  You have pain or burning when you urinate, or other problems that do not go away or cause discomfort.  You have problems with your catheter or your catheter is blocked.  You have a fever.  You have nausea or you vomit.  You have swelling in your legs. Get help right away if:  Your urine has blood clots in it.  Your urine is dark red.  You cannot urinate after your catheter is removed.  You have blood in your stool.  You have severe pain that does not get better with medicine.  You have shortness of breath. Summary  After the procedure, it is common to have swelling and discomfort around your urethra and blood in your urine for a few days.  Some men may have problems urinating after this procedure. These problems should go away after a few days. If you have pain or burning while urinating, contact your health care provider.  If you have a catheter after this procedure, care for it as told  by your health care provider.  If you have severe pain, dark red urine, or urine with blood clots, get medical help right away. This information is not intended to replace advice given to you by your health care provider. Make sure you discuss any questions you have with your health care provider. Document Revised: 12/27/2018 Document Reviewed: 03/16/2017 Elsevier Patient Education  [place], Adult An indwelling urinary catheter is a thin tube that is put into your bladder. The tube helps to drain pee (urine) out of your body. The tube goes in through your urethra. Your urethra is where pee comes out of your body. Your pee will come out through the catheter, then it will go into a bag (drainage bag). Take good care of your catheter so it will work well. How to wear your catheter and bag Supplies needed  Sticky tape (adhesive tape) or a leg strap.  Alcohol wipe or soap and water (if you use tape).  A clean towel (if you use tape).  Large overnight bag.  Smaller bag (leg bag). Wearing your catheter Attach your catheter to your leg with tape or a leg strap.  Make sure the catheter is not pulled tight.  If a leg strap gets wet, take it off and put on a dry strap.  If you use tape to hold the bag on your leg: 1. Use an alcohol wipe or soap and water to wash your skin where the tape made it sticky before. 2. Use a clean towel to pat-dry that skin. 3. Use new tape to make the bag stay on your leg. Wearing your bags You should have been given a large overnight bag.  You may wear the overnight bag in the day or night.  Always have the overnight bag lower than your bladder.  Do not  let the bag touch the floor.  Before you go to sleep, put a clean plastic bag in a wastebasket. Then hang the overnight bag inside the wastebasket. You should also have a smaller leg bag that fits under your clothes.  Always wear the leg bag below your  knee.  Do not wear your leg bag at night. How to care for your skin and catheter Supplies needed  A clean washcloth.  Water and mild soap.  A clean towel. Caring for your skin and catheter      Clean the skin around your catheter every day: 1. Wash your hands with soap and water. 2. Wet a clean washcloth in warm water and mild soap. 3. Clean the skin around your urethra.  If you are male:  Gently spread the folds of skin around your vagina (labia).  With the washcloth in your other hand, wipe the inner side of your labia on each side. Wipe from front to back.  If you are male:  Pull back any skin that covers the end of your penis (foreskin).  With the washcloth in your other hand, wipe your penis in small circles. Start wiping at the tip of your penis, then move away from the catheter.  Move the foreskin back in place, if needed. 4. With your free hand, hold the catheter close to where it goes into your body.  Keep holding the catheter during cleaning so it does not get pulled out. 5. With the washcloth in your other hand, clean the catheter.  Only wipe downward on the catheter.  Do not wipe upward toward your body. Doing this may push germs into your urethra and cause infection. 6. Use a clean towel to pat-dry the catheter and the skin around it. Make sure to wipe off all soap. 7. Wash your hands with soap and water.  Shower every day. Do not take baths.  Do not use cream, ointment, or lotion on the area where the catheter goes into your body, unless your doctor tells you to.  Do not use powders, sprays, or lotions on your genital area.  Check your skin around the catheter every day for signs of infection. Check for: ? Redness, swelling, or pain. ? Fluid or blood. ? Warmth. ? Pus or a bad smell. How to empty the bag Supplies needed  Rubbing alcohol.  Gauze pad or cotton ball.  Tape or a leg strap. Emptying the bag Pour the pee out of your bag when  it is ?- full, or at least 2-3 times a day. Do this for your overnight bag and your leg bag. 1. Wash your hands with soap and water. 2. Separate (detach) the bag from your leg. 3. Hold the bag over the toilet or a clean pail. Keep the bag lower than your hips and bladder. This is so the pee (urine) does not go back into the tube. 4. Open the pour spout. It is at the bottom of the bag. 5. Empty the pee into the toilet or pail. Do not let the pour spout touch any surface. 6. Put rubbing alcohol on a gauze pad or cotton ball. 7. Use the gauze pad or cotton ball to clean the pour spout. 8. Close the pour spout. 9. Attach the bag to your leg with tape or a leg strap. 10. Wash your hands with soap and water. Follow instructions for cleaning the drainage bag:  From the product maker.  As told by your doctor. How to change  the bag Supplies needed  Alcohol wipes.  A clean bag.  Tape or a leg strap. Changing the bag Replace your bag when it starts to leak, smell bad, or look dirty. 1. Wash your hands with soap and water. 2. Separate the dirty bag from your leg. 3. Pinch the catheter with your fingers so that pee does not spill out. 4. Separate the catheter tube from the bag tube where these tubes connect (at the connection valve). Do not let the tubes touch any surface. 5. Clean the end of the catheter tube with an alcohol wipe. Use a different alcohol wipe to clean the end of the bag tube. 6. Connect the catheter tube to the tube of the clean bag. 7. Attach the clean bag to your leg with tape or a leg strap. Do not make the bag tight on your leg. 8. Wash your hands with soap and water. General rules   Never pull on your catheter. Never try to take it out. Doing that can hurt you.  Always wash your hands before and after you touch your catheter or bag. Use a mild, fragrance-free soap. If you do not have soap and water, use hand sanitizer.  Always make sure there are no twists or bends  (kinks) in the catheter tube.  Always make sure there are no leaks in the catheter or bag.  Drink enough fluid to keep your pee pale yellow.  Do not take baths, swim, or use a hot tub.  If you are male, wipe from front to back after you poop (have a bowel movement). Contact a doctor if:  Your pee is cloudy.  Your pee smells worse than usual.  Your catheter gets clogged.  Your catheter leaks.  Your bladder feels full. Get help right away if:  You have redness, swelling, or pain where the catheter goes into your body.  You have fluid, blood, pus, or a bad smell coming from the area where the catheter goes into your body.  Your skin feels warm where the catheter goes into your body.  You have a fever.  You have pain in your: ? Belly (abdomen). ? Legs. ? Lower back. ? Bladder.  You see blood in the catheter.  Your pee is pink or red.  You feel sick to your stomach (nauseous).  You throw up (vomit).  You have chills.  Your pee is not draining into the bag.  Your catheter gets pulled out. Summary  An indwelling urinary catheter is a thin tube that is placed into the bladder to help drain pee (urine) out of the body.  The catheter is placed into the part of the body that drains pee from the bladder (urethra).  Taking good care of your catheter will keep it working properly and help prevent problems.  Always wash your hands before and after touching your catheter or bag.  Never pull on your catheter or try to take it out. This information is not intended to replace advice given to you by your health care provider. Make sure you discuss any questions you have with your health care provider. Document Revised: 12/28/2018 Document Reviewed: 04/21/2017 Elsevier Patient Education  2020 [place]   1) The drugs that you were given will stay in your system until tomorrow so for the next 24 hours you should  not:  A) Drive an automobile B) Make any legal decisions C) Drink any alcoholic beverage   2) You may  resume regular meals tomorrow.  Today it is better to start with liquids and gradually work up to solid foods.  You may eat anything you prefer, but it is better to start with liquids, then soup and crackers, and gradually work up to solid foods.   3) Please notify your doctor immediately if you have any unusual bleeding, trouble breathing, redness and pain at the surgery site, drainage, fever, or pain not relieved by medication.    4) Additional Instructions:        Please contact your physician with any problems or Same Day Surgery at [phone], Monday through Friday 6 am to 4 pm, or [place] at [facility] number at [phone].AMBULATORY SURGERY  DISCHARGE INSTRUCTIONS   5) The drugs that you were given will stay in your system until tomorrow so for the next 24 hours you should not:  D) Drive an automobile E) Make any legal decisions F) Drink any alcoholic beverage   6) You may resume regular meals tomorrow.  Today it is better to start with liquids and gradually work up to solid foods.  You may eat anything you prefer, but it is better to start with liquids, then soup and crackers, and gradually work up to solid foods.   7) Please notify your doctor immediately if you have any unusual bleeding, trouble breathing, redness and pain at the surgery site, drainage, fever, or pain not relieved by medication.    8) Additional Instructions:     Please contact your physician with any problems or Same Day Surgery at [phone], Monday through Friday 6 am to 4 pm, or  at [facility] number at [phone].

## 2019-09-26 NOTE — Anesthesia Preprocedure Evaluation (Addendum)
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   Neuro/Psych negative neurological ROS  negative psych ROS   GI/Hepatic GERD  Controlled,(+) Cirrhosis       ,   Endo/Other  negative endocrine ROS  Renal/GU      Musculoskeletal   Abdominal   Peds  Hematology negative hematology ROS (+)   Anesthesia Other Findings Past Medical History: No date: Anemia No date: Back pain No date: BPH (benign prostatic hyperplasia) No date: Cirrhosis ([place]) No date: COPD (chronic obstructive pulmonary disease) (HCC) No date: ED (erectile dysfunction) No date: GERD (gastroesophageal reflux disease) No date: Hyperlipidemia No date: Hypertension  Past Surgical History: 01/03/2018: COLONOSCOPY; Left     Comment:  Procedure: COLONOSCOPY;  Surgeon: Virgel Manifold,               MD;  Location: [facility] ENDOSCOPY;  Service: Endoscopy;                Laterality: Left; 04/05/2018: COLONOSCOPY WITH PROPOFOL; N/A     Comment:  Procedure: COLONOSCOPY WITH PROPOFOL;  Surgeon:               Virgel Manifold, MD;  Location: [facility] ENDOSCOPY;                Service: Endoscopy;  Laterality: N/A; 01/03/2018: ENTEROSCOPY     Comment:  Procedure: ENTEROSCOPY;  Surgeon: Virgel Manifold,               MD;  Location: [facility] ENDOSCOPY;  Service: Endoscopy;; 01/02/2018: ESOPHAGOGASTRODUODENOSCOPY; N/A     Comment:  Procedure: ESOPHAGOGASTRODUODENOSCOPY (EGD);  Surgeon:               Virgel Manifold, MD;  Location: [facility] ENDOSCOPY;                Service: Endoscopy;  Laterality: N/A; 04/05/2018: ESOPHAGOGASTRODUODENOSCOPY (EGD) WITH PROPOFOL; N/A     Comment:  Procedure: ESOPHAGOGASTRODUODENOSCOPY (EGD) WITH               PROPOFOL;  Surgeon: Virgel Manifold, MD;  Location:               [facility] ENDOSCOPY;  Service: Endoscopy;  Laterality: N/A; No date: HERNIA REPAIR     Comment:  1980's     Reproductive/Obstetrics negative OB ROS                           Anesthesia Physical Anesthesia Plan  ASA: III  Anesthesia Plan: General ETT   Post-op Pain Management:    Induction:   PONV Risk Score and Plan: Ondansetron, Dexamethasone and Treatment may vary due to age or medical condition  Airway Management Planned:   Additional Equipment:   Intra-op Plan:   Post-operative Plan:   Informed Consent: I have reviewed the patients History and Physical, chart, labs and discussed the procedure including the risks, benefits and alternatives for the proposed anesthesia with  the patient or authorized representative who has indicated his/her understanding and acceptance.     Dental Advisory Given  Plan Discussed with: Anesthesiologist  Anesthesia Plan Comments:         Anesthesia Quick Evaluation

## 2019-09-27 NOTE — Anesthesia Postprocedure Evaluation (Signed)
Anesthesia Post Note  Patient: SEGUNDO MAKELA  Procedure(s) Performed: GREEN LIGHT LASER TURP (TRANSURETHRAL RESECTION OF PROSTATE (N/A Prostate) CYSTOSCOPY WITH STENT PLACEMENT (Right Ureter)  Patient location during evaluation: PACU Anesthesia Type: General Level of consciousness: awake and alert Pain management: pain level controlled Vital Signs Assessment: post-procedure vital signs reviewed and stable Respiratory status: spontaneous breathing, nonlabored ventilation, respiratory function stable and patient connected to nasal cannula oxygen Cardiovascular status: blood pressure returned to baseline and stable Postop Assessment: no apparent nausea or vomiting Anesthetic complications: no     Last Vitals:  Vitals:   09/26/19 1427 09/26/19 1545  BP: 117/78 115/77  Pulse: 93 88  Resp: 20 20  Temp: (!) 36.1 C   SpO2: 95% 100%    Last Pain:  Vitals:   09/26/19 1545  TempSrc:   PainSc: 0-No pain                 Martha Clan

## 2019-09-30 ENCOUNTER — Telehealth: Payer: Self-pay | Admitting: Family Medicine

## 2019-09-30 ENCOUNTER — Ambulatory Visit: Payer: Self-pay | Admitting: Family Medicine

## 2019-09-30 ENCOUNTER — Ambulatory Visit (INDEPENDENT_AMBULATORY_CARE_PROVIDER_SITE_OTHER): Payer: Medicare Other | Admitting: Family Medicine

## 2019-09-30 ENCOUNTER — Other Ambulatory Visit: Payer: Self-pay

## 2019-09-30 DIAGNOSIS — Z23 Encounter for immunization: Secondary | ICD-10-CM | POA: Diagnosis not present

## 2019-09-30 NOTE — Telephone Encounter (Signed)
Patient advised he will need to wait one month between vaccines.

## 2019-09-30 NOTE — Telephone Encounter (Signed)
Patient just received the Hep A and Hep B vaccine today but he has been approved  To get the Covid Vaccine since he works at the [employer].  He wants to know how long he needs to wait before getting that at the health dept?

## 2019-10-02 ENCOUNTER — Ambulatory Visit: Payer: Medicare Other | Admitting: Urology

## 2019-10-23 ENCOUNTER — Other Ambulatory Visit: Payer: Self-pay | Admitting: Family Medicine

## 2019-10-23 DIAGNOSIS — I700 Atherosclerosis of aorta: Secondary | ICD-10-CM

## 2019-10-23 MED ORDER — ROSUVASTATIN CALCIUM 10 MG PO TABS: 10.0000 mg | ORAL_TABLET | Freq: Every day | ORAL | 0 refills | Status: DC

## 2019-10-23 NOTE — Telephone Encounter (Signed)
Medication Refill - Medication: rosuvastatin (CRESTOR) 10 MG tablet  Pt has one tablet left. Supply from [facility] will not arrive for at least 7 days. Requesting short supply to get pt through then. [facility]  Has the patient contacted their pharmacy? Yes.   (Agent: If no, request that the patient contact the pharmacy for the refill.) (Agent: If yes, when and what did the pharmacy advise?)  Preferred Pharmacy (with phone number or street name):  [id number] - [place], [address] Phone:  [phone]  Fax:  [phone]       Agent: Please be advised that RX refills may take up to 3 business days. We ask that you follow-up with your pharmacy.

## 2019-10-28 ENCOUNTER — Other Ambulatory Visit: Payer: Self-pay | Admitting: *Deleted

## 2019-10-28 DIAGNOSIS — I700 Atherosclerosis of aorta: Secondary | ICD-10-CM

## 2019-10-28 MED ORDER — ROSUVASTATIN CALCIUM 10 MG PO TABS: 10.0000 mg | ORAL_TABLET | Freq: Every day | ORAL | 3 refills | Status: DC

## 2019-11-11 ENCOUNTER — Other Ambulatory Visit: Payer: Self-pay | Admitting: Family Medicine

## 2019-11-11 DIAGNOSIS — J3089 Other allergic rhinitis: Secondary | ICD-10-CM

## 2019-11-11 MED ORDER — MONTELUKAST SODIUM 10 MG PO TABS: 10.0000 mg | ORAL_TABLET | Freq: Every day | ORAL | 1 refills | Status: DC

## 2019-11-11 NOTE — Telephone Encounter (Signed)
Medication Refill - Medication: montelukast (SINGULAIR) 10 MG tablet   Has the patient contacted their pharmacy? Yes.   (Agent: If no, request that the patient contact the pharmacy for the refill.) (Agent: If yes, when and what did the pharmacy advise?)  Preferred Pharmacy (with phone number or street name):  [id number] - [place], [address] Phone:  [phone]  Fax:  [phone]       Agent: Please be advised that RX refills may take up to 3 business days. We ask that you follow-up with your pharmacy.

## 2019-11-11 NOTE — Telephone Encounter (Signed)
Requested medication (s) are due for refill today: yes  Requested medication (s) are on the active medication list:yes  Last refill: 07/29/19  By Dr Patsey [facility]  Future visit scheduled: yes  Notes to clinic:   Last ordered by [facility]    Requested Prescriptions  Pending Prescriptions Disp Refills   montelukast (SINGULAIR) 10 MG tablet 90 tablet 1    Sig: Take 1 tablet (10 mg total) by mouth daily.      Pulmonology:  Leukotriene Inhibitors Passed - 11/11/2019  9:01 AM      Passed - Valid encounter within last 12 months    Recent Outpatient Visits           1 month ago Need for immunization against viral hepatitis   [facility] Birdie Sons, MD   3 months ago Hepatic cirrhosis, unspecified hepatic cirrhosis type, unspecified whether ascites present [facility])   [facility] Jerrol Banana., MD   6 months ago Annual physical exam   [facility] Jerrol Banana., MD   7 months ago Hepatic cirrhosis, unspecified hepatic cirrhosis type, unspecified whether ascites present [facility])   [facility] Jerrol Banana., MD   9 months ago Strain of neck muscle, initial encounter   [facility] Jerrol Banana., MD       Future Appointments             In 2 months Jerrol Banana., MD [facility] [place]

## 2019-11-19 ENCOUNTER — Inpatient Hospital Stay: Payer: Medicare Other | Attending: Hematology and Oncology

## 2019-11-19 ENCOUNTER — Other Ambulatory Visit: Payer: Self-pay

## 2019-11-19 DIAGNOSIS — D696 Thrombocytopenia, unspecified: Secondary | ICD-10-CM | POA: Diagnosis not present

## 2019-11-19 DIAGNOSIS — E538 Deficiency of other specified B group vitamins: Secondary | ICD-10-CM | POA: Insufficient documentation

## 2019-11-19 DIAGNOSIS — D500 Iron deficiency anemia secondary to blood loss (chronic): Secondary | ICD-10-CM

## 2019-11-19 LAB — CBC WITH DIFFERENTIAL/PLATELET
Abs Immature Granulocytes: 0.05 10*3/uL (ref 0.00–0.07)
Basophils Absolute: 0 10*3/uL (ref 0.0–0.1)
Basophils Relative: 0 %
Eosinophils Absolute: 0.1 10*3/uL (ref 0.0–0.5)
Eosinophils Relative: 1 %
HCT: 39.7 % (ref 39.0–52.0)
Hemoglobin: 13.3 g/dL (ref 13.0–17.0)
Immature Granulocytes: 1 %
Lymphocytes Relative: 24 %
Lymphs Abs: 1.7 10*3/uL (ref 0.7–4.0)
MCH: 30.6 pg (ref 26.0–34.0)
MCHC: 33.5 g/dL (ref 30.0–36.0)
MCV: 91.3 fL (ref 80.0–100.0)
Monocytes Absolute: 0.7 10*3/uL (ref 0.1–1.0)
Monocytes Relative: 10 %
Neutro Abs: 4.5 10*3/uL (ref 1.7–7.7)
Neutrophils Relative %: 64 %
Platelets: 143 10*3/uL — ABNORMAL LOW (ref 150–400)
RBC: 4.35 MIL/uL (ref 4.22–5.81)
RDW: 14.4 % (ref 11.5–15.5)
WBC: 7 10*3/uL (ref 4.0–10.5)
nRBC: 0 % (ref 0.0–0.2)

## 2019-11-19 LAB — FERRITIN: Ferritin: 191 ng/mL (ref 24–336)

## 2019-12-10 ENCOUNTER — Other Ambulatory Visit: Payer: Self-pay | Admitting: Family Medicine

## 2019-12-10 NOTE — Telephone Encounter (Signed)
Requested Prescriptions  Pending Prescriptions Disp Refills  . pantoprazole (PROTONIX) 20 MG tablet [Pharmacy Med Name: PANTOPRAZOLE SOD DR 20 MG TAB] 60 tablet 2    Sig: TAKE ONE TABLET BY MOUTH TWICE A DAY     Gastroenterology: Proton Pump Inhibitors Passed - 12/10/2019  2:04 PM      Passed - Valid encounter within last 12 months    Recent Outpatient Visits          2 months ago Need for immunization against viral hepatitis   [facility] Birdie Sons, MD   4 months ago Hepatic cirrhosis, unspecified hepatic cirrhosis type, unspecified whether ascites present [facility])   [facility] Jerrol Banana., MD   7 months ago Annual physical exam   [facility] Jerrol Banana., MD   8 months ago Hepatic cirrhosis, unspecified hepatic cirrhosis type, unspecified whether ascites present [facility])   [facility] Jerrol Banana., MD   10 months ago Strain of neck muscle, initial encounter   [facility] Jerrol Banana., MD      Future Appointments            In 1 month Jerrol Banana., MD [facility] [place]

## 2020-01-22 NOTE — Progress Notes (Signed)
Established patient visit  I,April Miller,acting as a scribe for [name], MD.,have documented all relevant documentation on the behalf of [name], MD,as directed by  [name], MD while in the presence of [name], MD.   Patient: Dwayne Thompson   DOB: [birth year]   74 y.o. Male  MRN: [id number] Visit Date: 01/27/2020  Today's healthcare provider: [name], MD   Chief Complaint  Patient presents with  . Follow-up  . Hypertension   Subjective    HPI Patient is also status post greenlight laser prostate surgery.  He had BPH symptoms and elevated PSA in the past. Hypertension, follow-up  BP Readings from Last 3 Encounters:  01/27/20 119/80  09/26/19 115/77  09/02/19 136/78   He was last seen for hypertension 6 months ago.  BP at that visit was 111/77. Management since that visit includes; controlled on lisinopril. Labs normal. He reports good compliance with treatment. He is not having side effects. none He is not exercising. He is adherent to low salt diet.   Outside blood pressures are not checking.  He does not smoke.  Use of agents associated with hypertension: amphetamines.   --------------------------------------------------------------------  Hepatic cirrhosis, unspecified hepatic cirrhosis type, unspecified whether ascites present ([place]) From 07/29/2019-Patient advised to try to completely abstain from alcohol. Patient is still drinking 2-4 drinks daily. Thrombocytopenia ([place]) From 07/29/2019-Due to cirrhosis      Medications: Outpatient Medications Prior to Visit  Medication Sig  . acetaminophen (TYLENOL) 325 MG tablet Take 2 tablets (650 mg total) by mouth 3 (three) times daily. (Patient taking differently: Take 650 mg by mouth every 6 (six) hours as needed for moderate pain. )  . cetirizine (ZYRTEC) 10 MG tablet Take 1 tablet (10 mg total) by mouth daily.  Marland Kitchen docusate sodium (COLACE) 100 MG capsule Take 2  capsules (200 mg total) by mouth 2 (two) times daily.  Marland Kitchen dutasteride (AVODART) 0.5 MG capsule Take 0.5 mg by mouth daily.  . folic acid (FOLVITE) 1 MG tablet Take 1 tablet (1 mg total) by mouth daily.  . montelukast (SINGULAIR) 10 MG tablet Take 1 tablet (10 mg total) by mouth daily.  . pantoprazole (PROTONIX) 20 MG tablet TAKE ONE TABLET BY MOUTH TWICE A DAY  . potassium chloride (K-DUR,KLOR-CON) 10 MEQ tablet Take 1 tablet (10 mEq total) by mouth 2 (two) times daily.  . rosuvastatin (CRESTOR) 10 MG tablet Take 1 tablet (10 mg total) by mouth daily.  . vitamin B-12 1000 MCG tablet Take 1 tablet (1,000 mcg total) by mouth daily.  . [DISCONTINUED] lisinopril (PRINIVIL,ZESTRIL) 10 MG tablet Take 1 tablet (10 mg total) by mouth daily.  . AMBULATORY NON FORMULARY MEDICATION Trimix (30 mg PAPA, 1 mL phen, 50 mg prostaglandin)   Vial 29ml   Dose 0.47mcg  Qty 10 #6 refills  [place] [phone] Fax [phone] (Patient not taking: Reported on 09/16/2019)  . amoxicillin-clavulanate (AUGMENTIN) 500-125 MG tablet Take 1 tablet by mouth 2 (two) times daily.  . ciprofloxacin (CIPRO) 500 MG tablet Take 1 tablet (500 mg total) by mouth 2 (two) times daily. (Patient not taking: Reported on 01/27/2020)  . cyclobenzaprine (FLEXERIL) 10 MG tablet TAKE ONE TABLET BY MOUTH TWICE A DAY AS NEEDED AND TAKE ONE TABLET BY MOUTH EVERY EVENING (Patient not taking: Reported on 01/27/2020)  . doxazosin (CARDURA) 4 MG tablet TAKE 1 TABLET DAILY (Patient not taking: Reported on 09/06/2019)  . Meth-Hyo-M Bl-Na Phos-Ph Sal (URIBEL)  118 MG CAPS Take 1 capsule (118 mg total) by mouth every 6 (six) hours as needed.  Marland Kitchen oxyCODONE-acetaminophen (PERCOCET) 5-325 MG tablet Take 1 tablet by mouth every 4 (four) hours as needed for moderate pain or severe pain. (Patient not taking: Reported on 01/27/2020)  . tadalafil (ADCIRCA/CIALIS) 20 MG tablet Take 1 tablet (20 mg total) by mouth daily as needed for erectile  dysfunction. (Patient not taking: Reported on 09/16/2019)  . valACYclovir (VALTREX) 1000 MG tablet Take 1 tablet (1,000 mg total) by mouth 3 (three) times daily. (Patient not taking: Reported on 09/06/2019)   No facility-administered medications prior to visit.    Review of Systems  Constitutional: Negative for appetite change, chills and fever.  Eyes: Negative.   Respiratory: Negative for chest tightness, shortness of breath and wheezing.   Cardiovascular: Negative for chest pain and palpitations.  Gastrointestinal: Negative for abdominal pain, nausea and vomiting.  Endocrine: Negative.   Musculoskeletal: Positive for arthralgias.       Mild chronic left knee pain.  Allergic/Immunologic: Negative.   Neurological: Negative.   Hematological: Negative.   Psychiatric/Behavioral: Negative.        Objective    BP 119/80 (BP Location: Left Arm, Patient Position: Sitting, Cuff Size: Large)   Pulse 64   Temp (!) 96.8 F (36 C) (Other (Comment))   Resp 18   Ht 5\' 11"  (1.803 m)   Wt 208 lb (94.3 kg)   SpO2 97%   BMI 29.01 kg/m  BP Readings from Last 3 Encounters:  01/27/20 119/80  09/26/19 115/77  09/02/19 136/78   Wt Readings from Last 3 Encounters:  01/27/20 208 lb (94.3 kg)  09/16/19 208 lb (94.3 kg)  09/02/19 203 lb (92.1 kg)      Physical Exam Vitals and nursing note reviewed.  Constitutional:      Appearance: Normal appearance. He is normal weight.  Eyes:     Conjunctiva/sclera: Conjunctivae normal.  Cardiovascular:     Rate and Rhythm: Normal rate and regular rhythm.     Pulses: Normal pulses.     Heart sounds: Normal heart sounds.  Pulmonary:     Effort: Pulmonary effort is normal.     Breath sounds: Normal breath sounds.  Abdominal:     Palpations: Abdomen is soft.  Musculoskeletal:        General: No swelling or tenderness.     Cervical back: Normal range of motion and neck supple.  Skin:    General: Skin is warm and dry.  Neurological:     General:  No focal deficit present.     Mental Status: He is alert and oriented to person, place, and time.  Psychiatric:        Mood and Affect: Mood normal.        Behavior: Behavior normal.        Thought Content: Thought content normal.        Judgment: Judgment normal.       No results found for any visits on 01/27/20.  Assessment & Plan     1. Essential hypertension Controlled.   2. Gastroesophageal reflux disease without esophagitis Decrease pantoprazole to one tablet daily.  3.  Hepatic alcoholic cirrhosis Patient advised advised to abstain or at least cut back on his alcohol consumption.  He and his wife are in agreement.  4. 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  5. BPH with obstruction/lower urinary tract symptoms Recent greenlight laser surgery.  6. Iron deficiency anemia due to chronic blood  loss Follow-up routine labs.  7. Chronic pain of left knee Avoid nonsteroidals in this patient, I think the pain is probably arthritic but coming from favoring his right leg so much.  May need physical therapy or Ortho referral.   Return in 3 months (on 04/23/2020), or For AWV and CPE.      I, [name], MD, have reviewed all documentation for this visit. The documentation on 01/30/20 for the exam, diagnosis, procedures, and orders are all accurate and complete.    Jamond Neels Cranford Mon, MD  [facility] [phone] (phone) [phone] (fax)  [place]

## 2020-01-27 ENCOUNTER — Other Ambulatory Visit: Payer: Self-pay

## 2020-01-27 ENCOUNTER — Encounter: Payer: Self-pay | Admitting: Family Medicine

## 2020-01-27 ENCOUNTER — Ambulatory Visit (INDEPENDENT_AMBULATORY_CARE_PROVIDER_SITE_OTHER): Payer: Medicare Other | Admitting: Family Medicine

## 2020-01-27 VITALS — BP 119/80 | HR 64 | Temp 96.8°F | Resp 18 | Ht 71.0 in | Wt 208.0 lb

## 2020-01-27 DIAGNOSIS — I10 Essential (primary) hypertension: Secondary | ICD-10-CM | POA: Diagnosis not present

## 2020-01-27 DIAGNOSIS — D696 Thrombocytopenia, unspecified: Secondary | ICD-10-CM | POA: Diagnosis not present

## 2020-01-27 DIAGNOSIS — K7460 Unspecified cirrhosis of liver: Secondary | ICD-10-CM

## 2020-01-27 DIAGNOSIS — G8929 Other chronic pain: Secondary | ICD-10-CM

## 2020-01-27 DIAGNOSIS — K219 Gastro-esophageal reflux disease without esophagitis: Secondary | ICD-10-CM | POA: Diagnosis not present

## 2020-01-27 DIAGNOSIS — D500 Iron deficiency anemia secondary to blood loss (chronic): Secondary | ICD-10-CM

## 2020-01-27 DIAGNOSIS — M25562 Pain in left knee: Secondary | ICD-10-CM

## 2020-01-27 DIAGNOSIS — N138 Other obstructive and reflux uropathy: Secondary | ICD-10-CM

## 2020-01-27 DIAGNOSIS — N401 Enlarged prostate with lower urinary tract symptoms: Secondary | ICD-10-CM

## 2020-01-27 MED ORDER — LISINOPRIL 10 MG PO TABS: 10.0000 mg | ORAL_TABLET | Freq: Every day | ORAL | 3 refills | Status: DC

## 2020-01-27 NOTE — Patient Instructions (Signed)
Decrease pantoprazole to one tablet daily.

## 2020-01-30 ENCOUNTER — Telehealth: Payer: Self-pay | Admitting: Family Medicine

## 2020-01-30 MED ORDER — CETIRIZINE HCL 10 MG PO TABS: 10.0000 mg | ORAL_TABLET | Freq: Every day | ORAL | 4 refills | Status: DC

## 2020-01-30 NOTE — Telephone Encounter (Signed)
Medication Refill - Medication: certrizine  Has the patient contacted their pharmacy? Yes.   (Agent: If no, request that the patient contact the pharmacy for the refill.) (Agent: If yes, when and what did the pharmacy advise?)  Preferred Pharmacy (with phone number or street name):  [facility]  [place] [address]  Phone: [phone] Fax: [phone]  Not a 24 hour pharmacy; exact hours not known.     Agent: Please be advised that RX refills may take up to 3 business days. We ask that you follow-up with your pharmacy.

## 2020-02-12 ENCOUNTER — Other Ambulatory Visit: Payer: Self-pay

## 2020-02-12 DIAGNOSIS — D500 Iron deficiency anemia secondary to blood loss (chronic): Secondary | ICD-10-CM

## 2020-02-18 NOTE — Progress Notes (Signed)
[facility]  [address], [address] [place], [place] [address] Phone: [phone]  Fax: [phone]   Office Visit:  02/19/2020  Referring physician: Jerrol Banana.,*  Chief Complaint: Dwayne Thompson is a 74 y.o. male with anupper GI bleed secondary to AVMs, B12 and folate deficiency, who is seen for a 6 month assessment.   HPI: The patient was last seen in the hematology clinic on 08/21/2019 via telemedicine. At that time, he felt "fine".  He felt "tired quickly".  He had urinary retention and is s/p temporary Foley catheter placement.  Hematuria s/p catheter has resolved.  Hematocrit was 41.8, hemoglobin 14.6, and MCV 87.6.  Ferritin was 325 felt secondary to acute phase reactant.  Labs on 11/19/2019 revealed a hematocrit of 39.7, hemoglobin 13.3, MCV 91.3, platelets 143,000, WBC 7000, ANC 4500. Ferritin was 191.   Chest CT on 08/28/2019 ordered by Dr. Patsey Berthold revealed previously seen right upper lobe pleural-based density was no longer evident. Minimal residual scarring at this site compatible with a resolved infectious or inflammatory process. Stable subcentimeter bilateral subpleural pulmonary nodules. Advanced centrilobular and paraseptal emphysema, with upper lobe predominance. Stable ascending thoracic aortic aneurysm, 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.  Prostate MRI ordered by Dr. Yves Dill on 09/03/2019 revealed no radiographic evidence of high-grade prostate carcinoma.   Patient underwent green light laser transurethral resection of prostate (TURP) on 09/26/2019 by Dr. Yves Dill. He tolerated the procedure well.   During the interim, he has been doing fine. His energy levels are ok. He feels tired upon exertion. He finds himself having to take some breaks throughout his day and just sit down in between doing things. He has been continuing to take D-22 and folic acid.  He takes medicine for his blood pressure, lisinopril, he takes one a day. He denies having begun any new  medications recently. He says has cirrhosis.    Past Medical History:  Diagnosis Date  . Anemia   . Back pain   . BPH (benign prostatic hyperplasia)   . Cirrhosis ([place])   . COPD (chronic obstructive pulmonary disease) ([place])   . ED (erectile dysfunction)   . GERD (gastroesophageal reflux disease)   . Hyperlipidemia   . Hypertension     Past Surgical History:  Procedure Laterality Date  . COLONOSCOPY Left 01/03/2018   Procedure: COLONOSCOPY;  Surgeon: Virgel Manifold, MD;  Location: [facility] ENDOSCOPY;  Service: Endoscopy;  Laterality: Left;  . COLONOSCOPY WITH PROPOFOL N/A 04/05/2018   Procedure: COLONOSCOPY WITH PROPOFOL;  Surgeon: Virgel Manifold, MD;  Location: [facility] ENDOSCOPY;  Service: Endoscopy;  Laterality: N/A;  . CYSTOSCOPY WITH STENT PLACEMENT Right 09/26/2019   Procedure: CYSTOSCOPY WITH STENT PLACEMENT;  Surgeon: Royston Cowper, MD;  Location: [facility] ORS;  Service: Urology;  Laterality: Right;  . ENTEROSCOPY  01/03/2018   Procedure: ENTEROSCOPY;  Surgeon: Virgel Manifold, MD;  Location: [facility] ENDOSCOPY;  Service: Endoscopy;;  . ESOPHAGOGASTRODUODENOSCOPY N/A 01/02/2018   Procedure: ESOPHAGOGASTRODUODENOSCOPY (EGD);  Surgeon: Virgel Manifold, MD;  Location: [facility] ENDOSCOPY;  Service: Endoscopy;  Laterality: N/A;  . ESOPHAGOGASTRODUODENOSCOPY (EGD) WITH PROPOFOL N/A 04/05/2018   Procedure: ESOPHAGOGASTRODUODENOSCOPY (EGD) WITH PROPOFOL;  Surgeon: Virgel Manifold, MD;  Location: [facility] ENDOSCOPY;  Service: Endoscopy;  Laterality: N/A;  . GREEN LIGHT LASER TURP (TRANSURETHRAL RESECTION OF PROSTATE N/A 09/26/2019   Procedure: GREEN LIGHT LASER TURP (TRANSURETHRAL RESECTION OF PROSTATE;  Surgeon: Royston Cowper, MD;  Location: [facility] ORS;  Service: Urology;  Laterality: N/A;  . HERNIA REPAIR  1980's    Family History  Problem Relation Age of Onset  . Alzheimer's disease Mother   . Sarcoidosis Sister   . Anxiety disorder Brother   . Kidney disease Neg Hx   .  Prostate cancer Neg Hx     Social History:  reports that he quit smoking about 6 years ago. His smoking use included cigarettes. He has a 50.00 pack-year smoking history. He has never used smokeless tobacco. He reports current alcohol use of about 21.0 - 28.0 standard drinks of alcohol per week. He reports that he does not use drugs. He drinks 2-3 alcohol shots/day. His wife's name isJanice and granddaughter's name isAriel. The patient is alone today.  Allergies:  Allergies  Allergen Reactions  . Tamsulosin Hcl Hives  . Sulfa Antibiotics Rash    Current Medications: Current Outpatient Medications  Medication Sig Dispense Refill  . acetaminophen (TYLENOL) 325 MG tablet Take 2 tablets (650 mg total) by mouth 3 (three) times daily. (Patient taking differently: Take 650 mg by mouth every 6 (six) hours as needed for moderate pain. ) 180 tablet 0  . cetirizine (ZYRTEC) 10 MG tablet Take 1 tablet (10 mg total) by mouth daily. 90 tablet 4  . cholecalciferol (VITAMIN D3) 25 MCG (1000 UNIT) tablet Take 1,000 Units by mouth daily.    Marland Kitchen dutasteride (AVODART) 0.5 MG capsule Take 0.5 mg by mouth daily.    . folic acid (FOLVITE) 1 MG tablet Take 1 tablet (1 mg total) by mouth daily. 30 tablet 0  . lisinopril (ZESTRIL) 10 MG tablet Take 1 tablet (10 mg total) by mouth daily. 90 tablet 3  . montelukast (SINGULAIR) 10 MG tablet Take 1 tablet (10 mg total) by mouth daily. 90 tablet 1  . pantoprazole (PROTONIX) 20 MG tablet TAKE ONE TABLET BY MOUTH TWICE A DAY 60 tablet 2  . potassium chloride (K-DUR,KLOR-CON) 10 MEQ tablet Take 1 tablet (10 mEq total) by mouth 2 (two) times daily. 180 tablet 3  . rosuvastatin (CRESTOR) 10 MG tablet Take 1 tablet (10 mg total) by mouth daily. 90 tablet 3  . vitamin B-12 1000 MCG tablet Take 1 tablet (1,000 mcg total) by mouth daily. 180 tablet 1  . AMBULATORY NON FORMULARY MEDICATION Trimix (30 mg PAPA, 1 mL phen, 50 mg prostaglandin)   Vial 66ml   Dose 0.65mcg  Qty 10  #6 refills  [place] [phone] Fax [phone] (Patient not taking: Reported on 09/16/2019) 10 vial 6  . amoxicillin-clavulanate (AUGMENTIN) 500-125 MG tablet Take 1 tablet by mouth 2 (two) times daily.    . ciprofloxacin (CIPRO) 500 MG tablet Take 1 tablet (500 mg total) by mouth 2 (two) times daily. (Patient not taking: Reported on 01/27/2020) 6 tablet 0  . cyclobenzaprine (FLEXERIL) 10 MG tablet TAKE ONE TABLET BY MOUTH TWICE A DAY AS NEEDED AND TAKE ONE TABLET BY MOUTH EVERY EVENING (Patient not taking: Reported on 01/27/2020) 90 tablet 3  . docusate sodium (COLACE) 100 MG capsule Take 2 capsules (200 mg total) by mouth 2 (two) times daily. (Patient not taking: Reported on 02/19/2020) 120 capsule 3  . doxazosin (CARDURA) 4 MG tablet TAKE 1 TABLET DAILY (Patient not taking: Reported on 09/06/2019) 90 tablet 3  . Meth-Hyo-M Bl-Na Phos-Ph Sal (URIBEL) 118 MG CAPS Take 1 capsule (118 mg total) by mouth every 6 (six) hours as needed. (Patient not taking: Reported on 02/19/2020) 40 capsule 3  . oxyCODONE-acetaminophen (PERCOCET) 5-325 MG tablet Take 1 tablet by mouth every 4 (  four) hours as needed for moderate pain or severe pain. (Patient not taking: Reported on 01/27/2020) 20 tablet 0  . tadalafil (ADCIRCA/CIALIS) 20 MG tablet Take 1 tablet (20 mg total) by mouth daily as needed for erectile dysfunction. (Patient not taking: Reported on 09/16/2019) 15 tablet 11  . valACYclovir (VALTREX) 1000 MG tablet Take 1 tablet (1,000 mg total) by mouth 3 (three) times daily. (Patient not taking: Reported on 09/06/2019) 21 tablet 0   No current facility-administered medications for this visit.    Review of Systems  Constitutional: Positive for malaise/fatigue (gets tired quick). Negative for chills, diaphoresis, fever and weight loss.       Feels "fine". Often feels tired when he exerts himself.   HENT: Negative.  Negative for congestion, hearing loss, nosebleeds, sinus pain, sore throat and  tinnitus.   Eyes: Negative.  Negative for blurred vision, double vision, photophobia and pain.  Respiratory: Negative for cough, hemoptysis, sputum production and shortness of breath.   Cardiovascular: Negative.  Negative for chest pain, palpitations, orthopnea, leg swelling and PND.  Gastrointestinal: Negative for abdominal pain, blood in stool, constipation, diarrhea, heartburn, melena, nausea and vomiting.  Genitourinary: Negative for dysuria, frequency, hematuria and urgency.  Musculoskeletal: Negative.  Negative for falls, joint pain and myalgias.  Skin: Negative.  Negative for itching and rash.  Neurological: Positive for weakness (upon exertion). Negative for dizziness, tremors, speech change, focal weakness and headaches.  Endo/Heme/Allergies: Negative.  Does not bruise/bleed easily.  Psychiatric/Behavioral: Negative.  Negative for depression and memory loss. The patient is not nervous/anxious and does not have insomnia.   All other systems reviewed and are negative.   Performance status (ECOG): 1 - Symptomatic but completely ambulatory  Physical Exam Vitals and nursing note reviewed.  Constitutional:      General: He is not in acute distress.    Appearance: Normal appearance. He is well-developed and well-nourished. He is not diaphoretic.     Interventions: Face mask in place.  HENT:     Head: Normocephalic and atraumatic.     Mouth/Throat:     Mouth: Oropharynx is clear and moist and mucous membranes are normal. No oral lesions.      Comments: Short brown hair with graying mustache.  Mask. Eyes:     General: No scleral icterus.       Right eye: No discharge.        Left eye: No discharge.     Extraocular Movements: EOM normal.     Conjunctiva/sclera: Conjunctivae normal.     Pupils: Pupils are equal, round, and reactive to light.     Comments: Brown eyes. Glasses.  Neck:     Vascular: No JVD.  Cardiovascular:     Rate and Rhythm: Normal rate and regular rhythm.      Heart sounds: Normal heart sounds. No murmur heard.  No friction rub. No gallop.   Pulmonary:     Effort: Pulmonary effort is normal. No respiratory distress.     Breath sounds: Normal breath sounds. No wheezing, rhonchi or rales.  Chest:     Chest wall: No tenderness.  Abdominal:     General: Bowel sounds are normal. There is no distension.     Palpations: Abdomen is soft. There is no hepatosplenomegaly or mass.     Tenderness: There is no abdominal tenderness. There is no CVA tenderness, guarding or rebound.  Musculoskeletal:        General: No tenderness or edema. Normal range of motion.  Cervical back: Normal range of motion and neck supple.  Lymphadenopathy:     Head:     Right side of head: No preauricular, posterior auricular or occipital adenopathy.     Left side of head: No preauricular, posterior auricular or occipital adenopathy.     Cervical: No cervical adenopathy.     Right cervical: No superficial cervical adenopathy.    Upper Body:  No axillary adenopathy present.    Right upper body: No supraclavicular adenopathy.     Left upper body: No supraclavicular adenopathy.     Lower Body: No right inguinal adenopathy. No left inguinal adenopathy.  Skin:    General: Skin is warm, dry and intact.     Coloration: Skin is not pale.     Findings: No bruising, erythema, lesion or rash.  Neurological:     Mental Status: He is alert and oriented to person, place, and time.  Psychiatric:        Mood and Affect: Mood and affect normal.        Behavior: Behavior normal.        Thought Content: Thought content normal.        Judgment: Judgment normal.    Imaging studies: 09/03/2018:  Low dose chest CT revealed a new area of subpleural consolidation in the right upper lobe, possibly infectious or malignant in etiology. There was a score of Lung-RADS 4B, suspicious. There was emphysema and aortic atherosclerosis.  09/24/2018:  Chest CT revealed improving subpleural  patchy/nodular opacity in the right upper lobe. Interval change favored an infectious/inflammatory etiology.  12/04/2018:  Chest CT revealed continued mild interval improvement in pleural based opacity over the anterolateral right upper lobe measuring 1 x 2.3 cm (previously 1.3 x 2.3 cm) likely resolving infectious/inflammatory process. New similar appearing pleural based opacity over the anterolateral aspect of the right apex measuring 9 x 15 mm also likely representing infectious/inflammatory process.    Appointment on 02/19/2020  Component Date Value Ref Range Status  . WBC 02/19/2020 6.9  4.0 - 10.5 K/uL Final  . RBC 02/19/2020 4.65  4.22 - 5.81 MIL/uL Final  . Hemoglobin 02/19/2020 14.2  13.0 - 17.0 g/dL Final  . HCT 02/19/2020 40.5  39.0 - 52.0 % Final  . MCV 02/19/2020 87.1  80.0 - 100.0 fL Final  . MCH 02/19/2020 30.5  26.0 - 34.0 pg Final  . MCHC 02/19/2020 35.1  30.0 - 36.0 g/dL Final  . RDW 02/19/2020 13.8  11.5 - 15.5 % Final  . Platelets 02/19/2020 117* 150 - 400 K/uL Final  . nRBC 02/19/2020 0.0  0.0 - 0.2 % Final  . Neutrophils Relative % 02/19/2020 70  % Final  . Neutro Abs 02/19/2020 4.7  1.7 - 7.7 K/uL Final  . Lymphocytes Relative 02/19/2020 22  % Final  . Lymphs Abs 02/19/2020 1.5  0.7 - 4.0 K/uL Final  . Monocytes Relative 02/19/2020 7  % Final  . Monocytes Absolute 02/19/2020 0.5  0.1 - 1.0 K/uL Final  . Eosinophils Relative 02/19/2020 1  % Final  . Eosinophils Absolute 02/19/2020 0.1  0.0 - 0.5 K/uL Final  . Basophils Relative 02/19/2020 0  % Final  . Basophils Absolute 02/19/2020 0.0  0.0 - 0.1 K/uL Final  . Immature Granulocytes 02/19/2020 0  % Final  . Abs Immature Granulocytes 02/19/2020 0.03  0.00 - 0.07 K/uL Final   Performed at [facility], [address] [place], [place] [address]    Assessment:  Dwayne Fieldhouse  Thompson is a 74 y.o. 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 He has had mild chronic  thrombocytopeniasince 04/2017. Platelet count has ranged between 101,000 - 134,000. During his active GI bleed platelet nadir was 83,000.  He has B12 deficiencyand folate deficiency.B12 was 221 on 01/03/2018 and 1357 on 02/12/2019.  Folate was 5.7 on 01/03/2018 and 25 on 08/15/2018. He is on oral [id number] folic acid. Dietis good. He drinks alcohol daily.  Anemia and thrombocytopenia work-up on 01/04/2018 revealed the following normal studies: hepatitis B surface antigen, hepatitis B core antibody total, hepatitis C antibody, and ANA. Ferritin was 272. Iron saturation was 34% with a TIBC of 265. HIV testing was negative on 01/10/2018.Abdominal ultrasoundon 01/03/2018 revealed a normal spleen.  Ferritinhas been followed: 272 on 01/03/2018, 231 on 01/10/2018, 177 on 02/14/2018, 79 on 05/16/2018, 197 on 08/15/2018, 107 on 11/13/2018, 156 on 02/12/2019, 90 on 05/15/2019, and 325 on 08/19/2019.  Upper endoscopyon 01/02/2018 revealed a normal esophagus, erythematous mucosa in the antrum, erythematous duodenopathy, and nodular mucosa in the duodenal bulb. There was no ulcerations or active sites of bleeding. Pathologyrevealed mild acute duodenitis, negative for dysplasia and malignancy. 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 There was no dysplasia or malignancy.  Colonoscopyon 01/03/2018 revealed melena in the entire colon c/w bleeding from the proximal GI tract. Small bowel enteroscopyon 01/03/2018 revealed no actively bleeding lesions. The examined portion of the jejunum was normal.   Capsule studyon 01/03/2018 revealed a red lesion in the first 7% of the study likely representing duodenal erythema and nodularity noted on recent EGD. There was a red spot at 78% with subsequent small red blood strands which could represent an oozing AVM in the distal small bowel. He was felt most likley to have GI bleeding frommid to distal  small bowel AVMs.  Chest CT on 02/15/2019 revealed advanced centrilobular and paraseptal emphysema.  The subpleural masslike density in the right upper lobe had nearly completely resolved with minimal residual scarring.  The small subpleural right middle lobe nodule was stable.  The small subpleural medial left upper lobe nodules was stable since recent study but had slowly enlarged in size since 2017. Recommend continued attention on follow-up imaging. There was coronary artery disease, aortic atherosclerosis, and a 4 cm ascending thoracic aortic aneurysm.  Symptomatically, he feels "fine".  He takes breaks between activities.  He denies any bleeding.  Exam is stable.   Plan: 1.   Labs today:  CBC with diff, ferritin, iron studies. 2.   Normocyticanemia Hematocrit 40.5.  Hemoglobin 14.2, and MCV 87.1. Ferritin 183 with an iron saturation of 29% and a TIBC 343.  Etiology of elevated ferritin felt secondary to acute phase reactant.  He is not taking oral iron. He has a history of gastritis, duodenitis, and small bowel AVMs. Continue to monitor. 3.   B12 deficiency He is on daily oral B12 1000 mcg supplementation. B12 level was 1357 on 02/12/2019. Monitor B12 annually. 4.   Folate deficiency He is on daily folic acid 1 mg/day. Folate was 41 on 08/19/2019. Monitor folate annually. 5.   Thrombocytopenia Platelets 117,000. He has a history of H. pylori (treated). Abdominal ultrasound revealed no splenomegaly. Platelet count fluctuates (? ITP). Additional labs at next blood draw. Continue to monitor. 6.   Abnormal chest CT             Chest CT on 02/15/2019 revealed stable to improved findings.  Chest CT on 08/28/2019 was personally reviewed.  Images personally reviewed.  Agree with radiology interpretation.     Right upper lobe pleural-based density is no longer evident.    Minimal residual scarring c/w a resolved infectious or inflammatory process.    Stable subcentimeter  bilateral subpleural pulmonary nodules.  7.   Hypotension       Blood pressure 99/69 (low for patient).  RN to call PCP about blood pressure. 8.   RTC in 3 months for labs (CBC with diff, platelet count in blue top tube, ANA with reflex, ferritin). 9.   RTC in 6 months for MD assessment and labs (CBC with diff, CMP, ferritin, iron studies).  I discussed the assessment and treatment plan with the patient.  The patient was provided an opportunity to ask questions and all were answered.  The patient agreed with the plan and demonstrated an understanding of the instructions.  The patient was advised to call back or seek an in person evaluation if the symptoms worsen or if the condition fails to improve as anticipated.  I provided 14 minutes (2:42 PM - 2:56 PM) of face-to-face time during this this encounter and > 50% was spent counseling as documented under my assessment and plan. An additional 10 minutes were spent reviewing his chart (Epic and Care Everywhere) including notes, labs, and imaging studies.    Nolon Stalls, MD, PhD  02/19/2020, 2:42 PM  I, Heywood Footman, am acting as a Education administrator for Lequita Asal, MD.  I, Marion Mike Gip, MD, have reviewed the above documentation for accuracy and completeness, and I agree with the above.

## 2020-02-19 ENCOUNTER — Encounter: Payer: Self-pay | Admitting: Hematology and Oncology

## 2020-02-19 ENCOUNTER — Inpatient Hospital Stay: Payer: Medicare Other

## 2020-02-19 ENCOUNTER — Other Ambulatory Visit: Payer: Self-pay

## 2020-02-19 ENCOUNTER — Other Ambulatory Visit: Payer: Self-pay | Admitting: Hematology and Oncology

## 2020-02-19 ENCOUNTER — Inpatient Hospital Stay: Payer: Medicare Other | Attending: Hematology and Oncology | Admitting: Hematology and Oncology

## 2020-02-19 VITALS — BP 99/69 | HR 79 | Temp 94.6°F | Resp 16 | Wt 201.9 lb

## 2020-02-19 DIAGNOSIS — Z8619 Personal history of other infectious and parasitic diseases: Secondary | ICD-10-CM | POA: Diagnosis not present

## 2020-02-19 DIAGNOSIS — E785 Hyperlipidemia, unspecified: Secondary | ICD-10-CM | POA: Diagnosis not present

## 2020-02-19 DIAGNOSIS — J449 Chronic obstructive pulmonary disease, unspecified: Secondary | ICD-10-CM | POA: Diagnosis not present

## 2020-02-19 DIAGNOSIS — R7989 Other specified abnormal findings of blood chemistry: Secondary | ICD-10-CM | POA: Diagnosis not present

## 2020-02-19 DIAGNOSIS — Z87891 Personal history of nicotine dependence: Secondary | ICD-10-CM | POA: Diagnosis not present

## 2020-02-19 DIAGNOSIS — K219 Gastro-esophageal reflux disease without esophagitis: Secondary | ICD-10-CM | POA: Diagnosis not present

## 2020-02-19 DIAGNOSIS — D649 Anemia, unspecified: Secondary | ICD-10-CM | POA: Insufficient documentation

## 2020-02-19 DIAGNOSIS — D500 Iron deficiency anemia secondary to blood loss (chronic): Secondary | ICD-10-CM | POA: Diagnosis not present

## 2020-02-19 DIAGNOSIS — D696 Thrombocytopenia, unspecified: Secondary | ICD-10-CM | POA: Diagnosis not present

## 2020-02-19 DIAGNOSIS — E538 Deficiency of other specified B group vitamins: Secondary | ICD-10-CM

## 2020-02-19 DIAGNOSIS — Z9079 Acquired absence of other genital organ(s): Secondary | ICD-10-CM | POA: Diagnosis not present

## 2020-02-19 DIAGNOSIS — I959 Hypotension, unspecified: Secondary | ICD-10-CM | POA: Insufficient documentation

## 2020-02-19 LAB — IRON AND TIBC
Iron: 99 ug/dL (ref 45–182)
Saturation Ratios: 29 % (ref 17.9–39.5)
TIBC: 343 ug/dL (ref 250–450)
UIBC: 244 ug/dL

## 2020-02-19 LAB — CBC WITH DIFFERENTIAL/PLATELET
Abs Immature Granulocytes: 0.03 10*3/uL (ref 0.00–0.07)
Basophils Absolute: 0 10*3/uL (ref 0.0–0.1)
Basophils Relative: 0 %
Eosinophils Absolute: 0.1 10*3/uL (ref 0.0–0.5)
Eosinophils Relative: 1 %
HCT: 40.5 % (ref 39.0–52.0)
Hemoglobin: 14.2 g/dL (ref 13.0–17.0)
Immature Granulocytes: 0 %
Lymphocytes Relative: 22 %
Lymphs Abs: 1.5 10*3/uL (ref 0.7–4.0)
MCH: 30.5 pg (ref 26.0–34.0)
MCHC: 35.1 g/dL (ref 30.0–36.0)
MCV: 87.1 fL (ref 80.0–100.0)
Monocytes Absolute: 0.5 10*3/uL (ref 0.1–1.0)
Monocytes Relative: 7 %
Neutro Abs: 4.7 10*3/uL (ref 1.7–7.7)
Neutrophils Relative %: 70 %
Platelets: 117 10*3/uL — ABNORMAL LOW (ref 150–400)
RBC: 4.65 MIL/uL (ref 4.22–5.81)
RDW: 13.8 % (ref 11.5–15.5)
WBC: 6.9 10*3/uL (ref 4.0–10.5)
nRBC: 0 % (ref 0.0–0.2)

## 2020-02-19 LAB — FERRITIN: Ferritin: 183 ng/mL (ref 24–336)

## 2020-02-19 NOTE — Progress Notes (Signed)
Patient here for oncology follow-up appointment,  concerns of some shortness of breath. BP 99/69. No other symptoms.

## 2020-03-17 ENCOUNTER — Ambulatory Visit: Payer: Medicare Other | Attending: Urology | Admitting: Physical Therapy

## 2020-03-17 ENCOUNTER — Other Ambulatory Visit: Payer: Self-pay

## 2020-03-17 ENCOUNTER — Encounter: Payer: Self-pay | Admitting: Physical Therapy

## 2020-03-17 DIAGNOSIS — M6281 Muscle weakness (generalized): Secondary | ICD-10-CM

## 2020-03-17 DIAGNOSIS — M6208 Separation of muscle (nontraumatic), other site: Secondary | ICD-10-CM | POA: Diagnosis present

## 2020-03-17 DIAGNOSIS — R2689 Other abnormalities of gait and mobility: Secondary | ICD-10-CM | POA: Diagnosis present

## 2020-03-17 NOTE — Therapy (Signed)
[place] MAIN [facility] SERVICES [address] [place], [place], [address] Phone: [phone]   Fax:  [phone]  Physical Therapy Evaluation  Patient Details  Name: Dwayne Thompson MRN: [id number] Date of Birth: 08-Aug-1946 Referring Provider (PT): Boneta Lucks   Encounter Date: 03/17/2020   PT End of Session - 03/17/20 1115    Visit Number 1    Number of Visits 10    Date for PT Re-Evaluation 05/26/20   eval 03/17/20   PT Start Time 1107    PT Stop Time 1200    PT Time Calculation (min) 53 min    Activity Tolerance Patient tolerated treatment well    Behavior During Therapy [facility] for tasks assessed/performed           Past Medical History:  Diagnosis Date  . Anemia   . Back pain   . BPH (benign prostatic hyperplasia)   . Cirrhosis ([place])   . COPD (chronic obstructive pulmonary disease) ([place])   . ED (erectile dysfunction)   . GERD (gastroesophageal reflux disease)   . Hyperlipidemia   . Hypertension     Past Surgical History:  Procedure Laterality Date  . COLONOSCOPY Left 01/03/2018   Procedure: COLONOSCOPY;  Surgeon: Virgel Manifold, MD;  Location: [facility] ENDOSCOPY;  Service: Endoscopy;  Laterality: Left;  . COLONOSCOPY WITH PROPOFOL N/A 04/05/2018   Procedure: COLONOSCOPY WITH PROPOFOL;  Surgeon: Virgel Manifold, MD;  Location: [facility] ENDOSCOPY;  Service: Endoscopy;  Laterality: N/A;  . CYSTOSCOPY WITH STENT PLACEMENT Right 09/26/2019   Procedure: CYSTOSCOPY WITH STENT PLACEMENT;  Surgeon: Royston Cowper, MD;  Location: [facility] ORS;  Service: Urology;  Laterality: Right;  . ENTEROSCOPY  01/03/2018   Procedure: ENTEROSCOPY;  Surgeon: Virgel Manifold, MD;  Location: [facility] ENDOSCOPY;  Service: Endoscopy;;  . ESOPHAGOGASTRODUODENOSCOPY N/A 01/02/2018   Procedure: ESOPHAGOGASTRODUODENOSCOPY (EGD);  Surgeon: Virgel Manifold, MD;  Location: [facility] ENDOSCOPY;  Service: Endoscopy;  Laterality: N/A;  . ESOPHAGOGASTRODUODENOSCOPY (EGD) WITH  PROPOFOL N/A 04/05/2018   Procedure: ESOPHAGOGASTRODUODENOSCOPY (EGD) WITH PROPOFOL;  Surgeon: Virgel Manifold, MD;  Location: [facility] ENDOSCOPY;  Service: Endoscopy;  Laterality: N/A;  . GREEN LIGHT LASER TURP (TRANSURETHRAL RESECTION OF PROSTATE N/A 09/26/2019   Procedure: GREEN LIGHT LASER TURP (TRANSURETHRAL RESECTION OF PROSTATE;  Surgeon: Royston Cowper, MD;  Location: [facility] ORS;  Service: Urology;  Laterality: N/A;  . HERNIA REPAIR     1980's    There were no vitals filed for this visit.    Subjective Assessment - 03/17/20 1114    Subjective Pt reports dripping after the turp procedure on 09/26/2019. Pt reports no control over it. Pt goes through 4 Depends per day. Pt was prescribed a penile clamp which helps. Pt is not able to control the leakage which occurs all the time. Pt works at the [employer]. Pt is not lifting at work nor home. Pt is not exercsing.  Bowel movements occurs every 2-3 days. Pt has to strain sometimes. Prior to the prostate surgery, pt had to catheterize for one month due to not being able to go. Prior to surgery, pt got up to pee in the night once, after surgery, pt gets up 3-4 x per night.  Pt does not have the sensation when his bladder is full. When the clamp is on, pt can feel it when it is full.    Patient is accompained by: Family member   wife   Pertinent History see Hx,    Patient Stated Goals  to wear no more Depends and improve erections              [facility] PT Assessment - 03/17/20 1116      Assessment   Medical Diagnosis urinary incontinence     Referring Provider (PT) [place]      Precautions   Precautions None      Restrictions   Weight Bearing Restrictions No      Balance Screen   Has the patient fallen in the past 6 months No      Observation/Other Assessments   Observations supine: 101 cm ASIS to malleoli     Scoliosis R shoulder higher, R thoracic/ R lumbar curves, L hip higher ( post Tx: levelled shoulder and iliac crest )         Strength   Overall Strength Comments R hip flex 3/5, L 4/5, knee ext/flex 4+/5, R hip abd 3-/5, L 4/5         Palpation   Spinal mobility hypomobility at thoracic and lumbar     SI assessment  hypomobile B SIJ , lacking nutation.                      Objective measurements completed on examination: See above findings.     Pelvic Floor Special Questions - 03/17/20 1138    Diastasis Recti 3 fingers width above umbilicus             [facility] Adult PT Treatment/Exercise - 03/17/20 1116      Ambulation/Gait   Gait velocity 0.65 m/s  ( post Tx: 0.76 m/s)     Gait Comments decreased stance on R, slight trunk lean R,  ( Post Tx: more equal weight bearing but R side still weak)         Therapeutic Activites    Therapeutic Activities Other Therapeutic Activities    Other Therapeutic Activities explained POC, role of pelvic/spinal alignment as, efficient IAP  prerequisites to urinary continence       Neuro Re-ed    Neuro Re-ed Details  cued for new HEP, log rolling       Manual Therapy   Manual therapy comments PA mob Grade III at lumbar. thoracic, sidelying, AP mob Grade III FADDIR B to promote SIJ mobility                        PT Long Term Goals - 03/17/20 1122      PT LONG TERM GOAL #1   Title Pt will report higher score on FOTO Urinary from 40 pts to > 45 pts in order to promote continence    Time 10    Period Weeks    Status New    Target Date 05/26/20      PT LONG TERM GOAL #2   Title Pt will demo levelled iliac crest and shoulders and less spinal deviations in order to progress to deep core strengthening and coordination and pelvic floor strengthening    Time 4    Period Weeks    Status New    Target Date 04/14/20      PT LONG TERM GOAL #3   Title Pt will demo increased strength in R hip flexion, hip ext, hip abd to > 4/5 in order to improve pelvic girdle strength for walking and contience    Time 8    Period Weeks    Status New    Target  Date 05/12/20  PT LONG TERM GOAL #4   Title Pt will show decreased abdominal separation from 3 fingers width above umbilicus to < 2 fingers width in order to promote IAP for continence    Time 6    Period Weeks    Status New    Target Date 04/28/20      PT LONG TERM GOAL #5   Title Pt will report having better control of urine when removing penile clamp in order to improve hygiene    Time 6    Period Weeks    Status New    Target Date 04/28/20      Additional Long Term Goals   Additional Long Term Goals --      PT LONG TERM GOAL #6   Title Pt will demo proper coordination and contraction of pelvic floor quick contrations 10 reps, 1 sec, and  endurance contractions 3 sec, 3 reps in order to minimize leakage    Time 8    Period Weeks    Status New    Target Date 05/12/20      PT LONG TERM GOAL #7   Title Pt will be referred for a sleep study to rule in or out OSA due to nocturia issues    Time 4    Period Weeks    Status New    Target Date 04/14/20      PT LONG TERM GOAL #8   Title Pt will demo increased gait speed from 0.65 m/s to > 1.0 m/s to ambulate in the community safely with less risk for falls    Time 8    Period Weeks    Status New    Target Date 05/12/20                  Plan - 03/17/20 1203    Clinical Impression Statement Pt is a 74 yo male who reports continuous urinary leakage s/p TURP ( 09/26/19 ) which is now being controlled by a penile clamp. Pt has to wear 4 Depends per day and nocturia has increased from 1x to 3-4 x per night. Pt 's clinical presentations included signs of poor intraabdominal pressure system which is associated increased risk for urinary incontinence: _diastasis recti ( 3 fingers width)  _dyscoordination of deep core mm, R sided weakness of hip mm _lack of understanding on exercises that places less strain on the abdomen/pelvic floor _spinal deviations ( R thoracic and lumbar convex curves, higher L iliac crest and R  shoulder)  _gait deviations and slowed gait speed   Pt was provided education on etiology of Sx with anatomy, physiology explanation with images along with the benefits of customized pelvic PT Tx based on pt's medical conditions and musculoskeletal deficits.   Following Tx, pt demo'd less spinal deviations and levelled pelvic girdle. Gait speed increased from 0.6 m/s to 0.76 m/s with less gait deviations. Anticipate pt will benefit from strengthening of global mm given pt's weaknesses in addition to pelvic floor strengthening. Pt benefits from skilled PT      Examination-Activity Limitations Continence    Stability/Clinical Decision Making Evolving/Moderate complexity    Clinical Decision Making Moderate    Rehab Potential Good    Clinical Impairments Affecting Rehab Potential --    PT Frequency 1x / week    PT Duration Other (comment)   10   PT Treatment/Interventions ADLs/Self Care Home Management;Moist Heat;Gait training;Therapeutic activities;Therapeutic exercise;Balance training;Neuromuscular re-education;Patient/family education;Manual techniques;Passive range of motion;Manual lymph drainage  PT Next Visit Plan --    PT Home Exercise Plan --    Consulted and Agree with Plan of Care Patient;Family member/caregiver    Family Member Consulted wife           Patient will benefit from skilled therapeutic intervention in order to improve the following deficits and impairments:  Pain, Decreased range of motion, Decreased strength, Impaired perceived functional ability, Decreased mobility, Decreased activity tolerance, Decreased balance, Hypomobility, Decreased endurance, Decreased coordination, Abnormal gait, Difficulty walking, Postural dysfunction, Improper body mechanics  Visit Diagnosis: Muscle weakness (generalized)  Other abnormalities of gait and mobility  Diastasis recti     Problem List Patient Active Problem List   Diagnosis Date Noted  . Essential hypertension  03/12/2019  . Thoracic aortic aneurysm ([place]) 03/12/2019  . Abnormal chest CT 02/13/2019  . Difficulty walking 12/06/2018  . Muscle weakness 12/06/2018  . Cellulitis of lower leg 09/17/2018  . CLE (columnar lined esophagus)   . Special screening for malignant neoplasms, colon   . Benign neoplasm of ascending colon   . Polyp of sigmoid colon   . Diverticulosis of large intestine without diverticulitis   . Iron deficiency anemia due to chronic blood loss 02/18/2018  . Folate deficiency 02/14/2018  . B12 deficiency 01/10/2018  . Folic acid deficiency 82/50/0370  . 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 Helicobacter pylori gastritis (chronic gastritis) 01/10/2018  . Thrombocytopenia ([place]) 01/10/2018  . Internal hemorrhoids   . Acute gastrointestinal hemorrhage   . Stomach irritation   . Duodenal nodule   . Melena 01/01/2018  . Neck pain on right side 03/23/2016  . History of tobacco abuse 03/23/2016  . Cervical lymphadenitis 03/23/2016  . 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 Allergic rhinitis 03/28/2015  . 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 Failure of erection 03/28/2015  . Acid reflux 03/28/2015  . Arthritis, degenerative 03/28/2015  . Change in blood platelet count 03/28/2015  . Temporary cerebral vascular dysfunction 03/28/2015  . Personal history of tobacco use, presenting hazards to health 03/28/2015    Jerl Mina ,PT, DPT, E-RYT  03/17/2020, 2:31 PM  [place] MAIN [facility] SERVICES [address] [place], [place], [address] Phone: [phone]   Fax:  [phone]  Name: Dwayne Thompson MRN: [id number] Date of Birth: 11/09/1945

## 2020-03-17 NOTE — Patient Instructions (Signed)
  Avoid straining pelvic floor, abdominal muscles , spine  Use log rolling technique instead of getting out of bed with your neck or the sit-up     Log rolling into and out of bed   Log rolling into and out of bed If getting out of bed on R side, Bent knees, scoot hips/ shoulder to L  Raise R arm completely overhead, rolling onto armpit  Then lower bent knees to bed to get into complete side lying position  Then drop legs off bed, and push up onto R elbow/forearm, and use L hand to push onto the bed   ___   Open book on the R only 15 reps   X 2 day   ( handout)      Clam Shell 45 Degrees Lying with hips and knees bent 45, one pillow between knees and ankles. Lift knee with exhale. Be sure pelvis does not roll backward. Do not arch back. Do 30 times, R only , 2 times per day.     Seated, green band on R foot , lift thigh up,and lower   X 2 day  30 reps

## 2020-03-24 ENCOUNTER — Other Ambulatory Visit: Payer: Self-pay

## 2020-03-24 ENCOUNTER — Ambulatory Visit: Payer: Medicare Other | Attending: Urology | Admitting: Physical Therapy

## 2020-03-24 ENCOUNTER — Encounter: Payer: Self-pay | Admitting: Physical Therapy

## 2020-03-24 DIAGNOSIS — M533 Sacrococcygeal disorders, not elsewhere classified: Secondary | ICD-10-CM | POA: Insufficient documentation

## 2020-03-24 DIAGNOSIS — R2689 Other abnormalities of gait and mobility: Secondary | ICD-10-CM | POA: Diagnosis present

## 2020-03-24 DIAGNOSIS — M6281 Muscle weakness (generalized): Secondary | ICD-10-CM | POA: Diagnosis present

## 2020-03-24 DIAGNOSIS — M6208 Separation of muscle (nontraumatic), other site: Secondary | ICD-10-CM | POA: Diagnosis present

## 2020-03-24 NOTE — Patient Instructions (Signed)
Knee to chest stretch with strap/ towel under thighs  10 reps    Knee rocking side to side 30 deg  10 reps   Birddog  Standing      L arm up , thumbs up, arm is out like a half"V" shoulder blade slides down and back  R foot back on floot   Put foot back under hips   Switch   7min

## 2020-03-24 NOTE — Therapy (Signed)
[place] MAIN [facility] SERVICES [address] [place], [place], [address] Phone: [phone]   Fax:  [phone]  Physical Therapy Treatment  Patient Details  Name: Dwayne Thompson MRN: [id number] Date of Birth: 12/20/1945 Referring Provider (Dwayne Thompson): Boneta Lucks   Encounter Date: 03/24/2020   Dwayne Thompson End of Session - 03/24/20 1144    Visit Number 2    Number of Visits 10    Date for Dwayne Thompson Re-Evaluation 05/26/20   eval 03/17/20   Dwayne Thompson Start Time 1103    Dwayne Thompson Stop Time 1159    Dwayne Thompson Time Calculation (min) 56 min    Activity Tolerance Patient tolerated treatment well    Behavior During Therapy [facility] for tasks assessed/performed           Past Medical History:  Diagnosis Date  . Anemia   . Back pain   . BPH (benign prostatic hyperplasia)   . Cirrhosis ([place])   . COPD (chronic obstructive pulmonary disease) ([place])   . ED (erectile dysfunction)   . GERD (gastroesophageal reflux disease)   . Hyperlipidemia   . Hypertension     Past Surgical History:  Procedure Laterality Date  . COLONOSCOPY Left 01/03/2018   Procedure: COLONOSCOPY;  Surgeon: Virgel Manifold, MD;  Location: [facility] ENDOSCOPY;  Service: Endoscopy;  Laterality: Left;  . COLONOSCOPY WITH PROPOFOL N/A 04/05/2018   Procedure: COLONOSCOPY WITH PROPOFOL;  Surgeon: Virgel Manifold, MD;  Location: [facility] ENDOSCOPY;  Service: Endoscopy;  Laterality: N/A;  . CYSTOSCOPY WITH STENT PLACEMENT Right 09/26/2019   Procedure: CYSTOSCOPY WITH STENT PLACEMENT;  Surgeon: Royston Cowper, MD;  Location: [facility] ORS;  Service: Urology;  Laterality: Right;  . ENTEROSCOPY  01/03/2018   Procedure: ENTEROSCOPY;  Surgeon: Virgel Manifold, MD;  Location: [facility] ENDOSCOPY;  Service: Endoscopy;;  . ESOPHAGOGASTRODUODENOSCOPY N/A 01/02/2018   Procedure: ESOPHAGOGASTRODUODENOSCOPY (EGD);  Surgeon: Virgel Manifold, MD;  Location: [facility] ENDOSCOPY;  Service: Endoscopy;  Laterality: N/A;  . ESOPHAGOGASTRODUODENOSCOPY (EGD) WITH  PROPOFOL N/A 04/05/2018   Procedure: ESOPHAGOGASTRODUODENOSCOPY (EGD) WITH PROPOFOL;  Surgeon: Virgel Manifold, MD;  Location: [facility] ENDOSCOPY;  Service: Endoscopy;  Laterality: N/A;  . exploration and neurolysis of right peroneal nerve at the knee Right 08/27/2018  . GREEN LIGHT LASER TURP (TRANSURETHRAL RESECTION OF PROSTATE N/A 09/26/2019   Procedure: GREEN LIGHT LASER TURP (TRANSURETHRAL RESECTION OF PROSTATE;  Surgeon: Royston Cowper, MD;  Location: [facility] ORS;  Service: Urology;  Laterality: N/A;  . HERNIA REPAIR     1980's    There were no vitals filed for this visit.   Subjective Assessment - 03/24/20 1110    Subjective Dwayne Thompson has been doing his HEP.    Patient is accompained by: Family member   wife   Pertinent History see Hx,    Patient Stated Goals to wear no more Depends and improve erections              [facility] Dwayne Thompson Assessment - 03/24/20 1111      Observation/Other Assessments   Scoliosis shoulder and iliac crest levelled, R thoracic / R lumbar convex curves       Strength   Overall Strength Comments hip flex 4-/5,       Palpation   Spinal mobility hypomobility at throacic convex, tight R upper quadrant, lumbar paraspinals                          [facility] Adult Dwayne Thompson Treatment/Exercise - 03/24/20 1142  Neuro Re-ed    Neuro Re-ed Details  cued for new HEP see Dwayne Thompson instructions       Modalities   Modalities Moist Heat      Moist Heat Therapy   Number Minutes Moist Heat 5 Minutes    Moist Heat Location --   back      Manual Therapy   Manual therapy comments rotational mob at thoracic/ lumbar convex curves, STM/MWM at R upper quadrant to promote mobility in these areas and realign spine                        Dwayne Thompson Long Term Goals - 03/17/20 1122      Dwayne Thompson LONG TERM GOAL #1   Title Dwayne Thompson will report higher score on FOTO Urinary from 40 pts to > 45 pts in order to promote continence    Time 10    Period Weeks    Status New    Target Date  05/26/20      Dwayne Thompson LONG TERM GOAL #2   Title Dwayne Thompson will demo levelled iliac crest and shoulders and less spinal deviations in order to progress to deep core strengthening and coordination and pelvic floor strengthening    Time 4    Period Weeks    Status New    Target Date 04/14/20      Dwayne Thompson LONG TERM GOAL #3   Title Dwayne Thompson will demo increased strength in R hip flexion, hip ext, hip abd to > 4/5 in order to improve pelvic girdle strength for walking and contience    Time 8    Period Weeks    Status New    Target Date 05/12/20      Dwayne Thompson LONG TERM GOAL #4   Title Dwayne Thompson will show decreased abdominal separation from 3 fingers width above umbilicus to < 2 fingers width in order to promote IAP for continence    Time 6    Period Weeks    Status New    Target Date 04/28/20      Dwayne Thompson LONG TERM GOAL #5   Title Dwayne Thompson will report having better control of urine when removing penile clamp in order to improve hygiene    Time 6    Period Weeks    Status New    Target Date 04/28/20      Additional Long Term Goals   Additional Long Term Goals --      Dwayne Thompson LONG TERM GOAL #6   Title Dwayne Thompson will demo proper coordination and contraction of pelvic floor quick contrations 10 reps, 1 sec, and  endurance contractions 3 sec, 3 reps in order to minimize leakage    Time 8    Period Weeks    Status New    Target Date 05/12/20      Dwayne Thompson LONG TERM GOAL #7   Title Dwayne Thompson will be referred for a sleep study to rule in or out OSA due to nocturia issues    Time 4    Period Weeks    Status New    Target Date 04/14/20      Dwayne Thompson LONG TERM GOAL #8   Title Dwayne Thompson will demo increased gait speed from 0.65 m/s to > 1.0 m/s to ambulate in the community safely with less risk for falls    Time 8    Period Weeks    Status New    Target Date 05/12/20  Plan - 03/24/20 1144    Clinical Impression Statement Dwayne Thompson showed good carry over from last session as his shoulders and iliac crest are more levelled after last Tx. Continued to  mobility his thoracic / lumbar R convex curves today which will help with hypogastric/ iliohypogastric nn to bladder innervation via Leory Plowman loops for micturition. Plan to initiate pelvic floor and deep core strengthening for urinary incontinence. Dwayne Thompson continues to benefit from skilled Dwayne Thompson.    Examination-Activity Limitations Continence    Stability/Clinical Decision Making Evolving/Moderate complexity    Rehab Potential Good    Dwayne Thompson Frequency 1x / week    Dwayne Thompson Duration Other (comment)   10   Dwayne Thompson Treatment/Interventions ADLs/Self Care Home Management;Moist Heat;Gait training;Therapeutic activities;Therapeutic exercise;Balance training;Neuromuscular re-education;Patient/family education;Manual techniques;Passive range of motion;Manual lymph drainage    Consulted and Agree with Plan of Care Patient;Family member/caregiver    Family Member Consulted wife           Patient will benefit from skilled therapeutic intervention in order to improve the following deficits and impairments:  Pain, Decreased range of motion, Decreased strength, Impaired perceived functional ability, Decreased mobility, Decreased activity tolerance, Decreased balance, Hypomobility, Decreased endurance, Decreased coordination, Abnormal gait, Difficulty walking, Postural dysfunction, Improper body mechanics  Visit Diagnosis: Muscle weakness (generalized)  Other abnormalities of gait and mobility  Diastasis recti     Problem List Patient Active Problem List   Diagnosis Date Noted  . Essential hypertension 03/12/2019  . Thoracic aortic aneurysm ([address]) 03/12/2019  . Abnormal chest CT 02/13/2019  . Difficulty walking 12/06/2018  . Muscle weakness 12/06/2018  . Cellulitis of lower leg 09/17/2018  . CLE (columnar lined esophagus)   . Special screening for malignant neoplasms, colon   . Benign neoplasm of ascending colon   . Polyp of sigmoid colon   . Diverticulosis of large intestine without diverticulitis   . Iron  deficiency anemia due to chronic blood loss 02/18/2018  . Folate deficiency 02/14/2018  . B12 deficiency 01/10/2018  . Folic acid deficiency 24/26/8341  . 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 Helicobacter pylori gastritis (chronic gastritis) 01/10/2018  . Thrombocytopenia ([place]) 01/10/2018  . Internal hemorrhoids   . Acute gastrointestinal hemorrhage   . Stomach irritation   . Duodenal nodule   . Melena 01/01/2018  . Neck pain on right side 03/23/2016  . History of tobacco abuse 03/23/2016  . Cervical lymphadenitis 03/23/2016  . 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 Allergic rhinitis 03/28/2015  . 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 Failure of erection 03/28/2015  . Acid reflux 03/28/2015  . Arthritis, degenerative 03/28/2015  . Change in blood platelet count 03/28/2015  . Temporary cerebral vascular dysfunction 03/28/2015  . Personal history of tobacco use, presenting hazards to health 03/28/2015    Dwayne Thompson ,Dwayne Thompson, Dwayne Thompson, Dwayne Thompson  03/24/2020, 11:47 AM  [place] [address] [place], [place], [address] Phone: [phone]   Fax:  [phone]  Name: Dwayne Thompson MRN: [id number] Date of Birth: [birth year]

## 2020-03-31 ENCOUNTER — Ambulatory Visit: Payer: Medicare Other | Admitting: Physical Therapy

## 2020-03-31 ENCOUNTER — Other Ambulatory Visit: Payer: Self-pay

## 2020-03-31 DIAGNOSIS — R2689 Other abnormalities of gait and mobility: Secondary | ICD-10-CM

## 2020-03-31 DIAGNOSIS — M6281 Muscle weakness (generalized): Secondary | ICD-10-CM

## 2020-03-31 DIAGNOSIS — M6208 Separation of muscle (nontraumatic), other site: Secondary | ICD-10-CM

## 2020-03-31 NOTE — Therapy (Signed)
[place] MAIN [facility] SERVICES [address] [place], [place], [address] Phone: [phone]   Fax:  [phone]  Physical Therapy Treatment  Patient Details  Name: Dwayne Thompson MRN: [id number] Date of Birth: 12-03-1945 Referring Provider (PT): Boneta Lucks   Encounter Date: 03/31/2020   PT End of Session - 03/31/20 1547    Visit Number 3    Number of Visits 10    Date for PT Re-Evaluation 05/26/20   eval 03/17/20   PT Start Time 1100    PT Stop Time 1200    PT Time Calculation (min) 60 min    Activity Tolerance Patient tolerated treatment well    Behavior During Therapy [facility] for tasks assessed/performed           Past Medical History:  Diagnosis Date  . Anemia   . Back pain   . BPH (benign prostatic hyperplasia)   . Cirrhosis ([place])   . COPD (chronic obstructive pulmonary disease) ([place])   . ED (erectile dysfunction)   . GERD (gastroesophageal reflux disease)   . Hyperlipidemia   . Hypertension     Past Surgical History:  Procedure Laterality Date  . COLONOSCOPY Left 01/03/2018   Procedure: COLONOSCOPY;  Surgeon: Virgel Manifold, MD;  Location: [facility] ENDOSCOPY;  Service: Endoscopy;  Laterality: Left;  . COLONOSCOPY WITH PROPOFOL N/A 04/05/2018   Procedure: COLONOSCOPY WITH PROPOFOL;  Surgeon: Virgel Manifold, MD;  Location: [facility] ENDOSCOPY;  Service: Endoscopy;  Laterality: N/A;  . CYSTOSCOPY WITH STENT PLACEMENT Right 09/26/2019   Procedure: CYSTOSCOPY WITH STENT PLACEMENT;  Surgeon: Royston Cowper, MD;  Location: [facility] ORS;  Service: Urology;  Laterality: Right;  . ENTEROSCOPY  01/03/2018   Procedure: ENTEROSCOPY;  Surgeon: Virgel Manifold, MD;  Location: [facility] ENDOSCOPY;  Service: Endoscopy;;  . ESOPHAGOGASTRODUODENOSCOPY N/A 01/02/2018   Procedure: ESOPHAGOGASTRODUODENOSCOPY (EGD);  Surgeon: Virgel Manifold, MD;  Location: [facility] ENDOSCOPY;  Service: Endoscopy;  Laterality: N/A;  . ESOPHAGOGASTRODUODENOSCOPY (EGD) WITH  PROPOFOL N/A 04/05/2018   Procedure: ESOPHAGOGASTRODUODENOSCOPY (EGD) WITH PROPOFOL;  Surgeon: Virgel Manifold, MD;  Location: [facility] ENDOSCOPY;  Service: Endoscopy;  Laterality: N/A;  . exploration and neurolysis of right peroneal nerve at the knee Right 08/27/2018  . GREEN LIGHT LASER TURP (TRANSURETHRAL RESECTION OF PROSTATE N/A 09/26/2019   Procedure: GREEN LIGHT LASER TURP (TRANSURETHRAL RESECTION OF PROSTATE;  Surgeon: Royston Cowper, MD;  Location: [facility] ORS;  Service: Urology;  Laterality: N/A;  . HERNIA REPAIR     1980's    There were no vitals filed for this visit.   Subjective Assessment - 03/31/20 1108    Subjective Pt reports he notices he is leaking more when sitting and standing. One day, pt noticed he had a little bit of blood at the tip of his penis after getting off the commode.    Patient is accompained by: Family member   wife   Pertinent History see Hx,    Patient Stated Goals to wear no more Depends and improve erections              [facility] PT Assessment - 03/31/20 1122      Observation/Other Assessments   Scoliosis R shoulder slightly higher, significantly decreased lumbar curve, minor R thoracic convex curve       Sit to Stand   Comments breathholding, difficulty without hand support       Palpation   Spinal mobility tightness at medial scapula on R, intercostals T3-8 tightness, upper trap R  [facility] Adult PT Treatment/Exercise - 03/31/20 1122      Therapeutic Activites    Other Therapeutic Activities explained the principles to body mechanics in against gravity tasks to minimize leakage       Neuro Re-ed    Neuro Re-ed Details  cued for sit to stand, log rolling  with deep core coordination to minimize leakage       Manual Therapy   Manual therapy comments STM/MWM at upper R quadrant                        PT Long Term Goals - 03/17/20 1122      PT LONG TERM GOAL #1   Title Pt will report  higher score on FOTO Urinary from 40 pts to > 45 pts in order to promote continence    Time 10    Period Weeks    Status New    Target Date 05/26/20      PT LONG TERM GOAL #2   Title Pt will demo levelled iliac crest and shoulders and less spinal deviations in order to progress to deep core strengthening and coordination and pelvic floor strengthening    Time 4    Period Weeks    Status New    Target Date 04/14/20      PT LONG TERM GOAL #3   Title Pt will demo increased strength in R hip flexion, hip ext, hip abd to > 4/5 in order to improve pelvic girdle strength for walking and contience    Time 8    Period Weeks    Status New    Target Date 05/12/20      PT LONG TERM GOAL #4   Title Pt will show decreased abdominal separation from 3 fingers width above umbilicus to < 2 fingers width in order to promote IAP for continence    Time 6    Period Weeks    Status New    Target Date 04/28/20      PT LONG TERM GOAL #5   Title Pt will report having better control of urine when removing penile clamp in order to improve hygiene    Time 6    Period Weeks    Status New    Target Date 04/28/20      Additional Long Term Goals   Additional Long Term Goals --      PT LONG TERM GOAL #6   Title Pt will demo proper coordination and contraction of pelvic floor quick contrations 10 reps, 1 sec, and  endurance contractions 3 sec, 3 reps in order to minimize leakage    Time 8    Period Weeks    Status New    Target Date 05/12/20      PT LONG TERM GOAL #7   Title Pt will be referred for a sleep study to rule in or out OSA due to nocturia issues    Time 4    Period Weeks    Status New    Target Date 04/14/20      PT LONG TERM GOAL #8   Title Pt will demo increased gait speed from 0.65 m/s to > 1.0 m/s to ambulate in the community safely with less risk for falls    Time 8    Period Weeks    Status New    Target Date 05/12/20  Plan - 03/31/20 1548    Clinical  Impression Statement Pt demo'd significantly less lumbar convex curve on R today and minor shoulder height difference with moderate thoracic curve. Manual Tx was applied to address this upper curve which pt tolerated without complaints. Progressed pt to deep core level 1 and 2 exercises to promote better IAP and provided education on body mechanics to decrease leakage and minimize strain on pelvic floor. Pt required several trials to achieve sit to stand with proper technique. Pt required UE support but more glut strengthening has been introduced which will help with increasing his strength to hopefully help pt to achieve sit to stand without UE support and thus, decrease fall risks. Pt continues to benefit from skilled PT    Examination-Activity Limitations Continence    Stability/Clinical Decision Making Evolving/Moderate complexity    Rehab Potential Good    PT Frequency 1x / week    PT Duration Other (comment)   10   PT Treatment/Interventions ADLs/Self Care Home Management;Moist Heat;Gait training;Therapeutic activities;Therapeutic exercise;Balance training;Neuromuscular re-education;Patient/family education;Manual techniques;Passive range of motion;Manual lymph drainage    Consulted and Agree with Plan of Care Patient;Family member/caregiver    Family Member Consulted wife           Patient will benefit from skilled therapeutic intervention in order to improve the following deficits and impairments:  Pain, Decreased range of motion, Decreased strength, Impaired perceived functional ability, Decreased mobility, Decreased activity tolerance, Decreased balance, Hypomobility, Decreased endurance, Decreased coordination, Abnormal gait, Difficulty walking, Postural dysfunction, Improper body mechanics  Visit Diagnosis: Muscle weakness (generalized)  Other abnormalities of gait and mobility  Diastasis recti     Problem List Patient Active Problem List   Diagnosis Date Noted  . Essential  hypertension 03/12/2019  . Thoracic aortic aneurysm ([place]) 03/12/2019  . Abnormal chest CT 02/13/2019  . Difficulty walking 12/06/2018  . Muscle weakness 12/06/2018  . Cellulitis of lower leg 09/17/2018  . CLE (columnar lined esophagus)   . Special screening for malignant neoplasms, colon   . Benign neoplasm of ascending colon   . Polyp of sigmoid colon   . Diverticulosis of large intestine without diverticulitis   . Iron deficiency anemia due to chronic blood loss 02/18/2018  . Folate deficiency 02/14/2018  . B12 deficiency 01/10/2018  . Folic acid deficiency 25/95/6387  . 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 Helicobacter pylori gastritis (chronic gastritis) 01/10/2018  . Thrombocytopenia ([place]) 01/10/2018  . Internal hemorrhoids   . Acute gastrointestinal hemorrhage   . Stomach irritation   . Duodenal nodule   . Melena 01/01/2018  . Neck pain on right side 03/23/2016  . History of tobacco abuse 03/23/2016  . Cervical lymphadenitis 03/23/2016  . 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 Allergic rhinitis 03/28/2015  . 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 Failure of erection 03/28/2015  . Acid reflux 03/28/2015  . Arthritis, degenerative 03/28/2015  . Change in blood platelet count 03/28/2015  . Temporary cerebral vascular dysfunction 03/28/2015  . Personal history of tobacco use, presenting hazards to health 03/28/2015    Jerl Mina ,PT, DPT, E-RYT  03/31/2020, 3:51 PM  [place] MAIN [facility] SERVICES [address] [place], [place], [address] Phone: [phone]   Fax:  [phone]  Name: MAXMILLIAN CARSEY MRN: [id number] Date of Birth: Feb 25, 1946

## 2020-03-31 NOTE — Patient Instructions (Addendum)
  Proper body mechanics with getting out of a chair or the commode to decrease strain  on back &pelvic floor and decrease leakage   Avoid holding your breath when Getting out of the chair:  Scoot to front part of chair chair Heels behind feet, feet are hip width apart, nose over toes  Inhale like you are smelling roses Exhale to stand    ___  lower down slow to sit and use your hands for support on the chair as you rise and lower   ___   Avoid straining pelvic floor, abdominal muscles , spine  Use log rolling technique instead of getting out of bed with your neck or the sit-up     Log rolling into and out of bed   Log rolling into and out of bed If getting out of bed on R side, Bent knees, scoot hips/ shoulder to L  Raise R arm completely overhead, rolling onto armpit  Then lower bent knees to bed to get into complete side lying position  Then drop legs off bed, and push up onto R elbow/forearm, and use L hand to push onto the bed   ___  Deep core level 1 and 2 ( handout for deep core muscles)   X 3 x day   ___ Side stepping R and L along hallway  4 laps x 2 x day  ___

## 2020-04-07 ENCOUNTER — Other Ambulatory Visit: Payer: Self-pay

## 2020-04-07 ENCOUNTER — Ambulatory Visit: Payer: Medicare Other | Admitting: Physical Therapy

## 2020-04-07 DIAGNOSIS — M6281 Muscle weakness (generalized): Secondary | ICD-10-CM

## 2020-04-07 DIAGNOSIS — M6208 Separation of muscle (nontraumatic), other site: Secondary | ICD-10-CM

## 2020-04-07 DIAGNOSIS — R2689 Other abnormalities of gait and mobility: Secondary | ICD-10-CM

## 2020-04-07 NOTE — Therapy (Addendum)
[place] MAIN [facility] SERVICES [address] [place], [place], [address] Phone: [phone]   Fax:  [phone]  Physical Therapy Treatment  Patient Details  Name: Dwayne Thompson MRN: [id number] Date of Birth: [birth year] Referring Provider (PT): Boneta Lucks   Encounter Date: 04/07/2020   PT End of Session - 04/07/20 1114    Visit Number 4    Number of Visits 10    Date for PT Re-Evaluation 05/26/20   eval 03/17/20   PT Start Time 1109    PT Stop Time 1210    PT Time Calculation (min) 61 min    Activity Tolerance Patient tolerated treatment well    Behavior During Therapy [facility] for tasks assessed/performed           Past Medical History:  Diagnosis Date  . Anemia   . Back pain   . BPH (benign prostatic hyperplasia)   . Cirrhosis ([place])   . COPD (chronic obstructive pulmonary disease) ([place])   . ED (erectile dysfunction)   . GERD (gastroesophageal reflux disease)   . Hyperlipidemia   . Hypertension     Past Surgical History:  Procedure Laterality Date  . COLONOSCOPY Left 01/03/2018   Procedure: COLONOSCOPY;  Surgeon: Virgel Manifold, MD;  Location: [facility] ENDOSCOPY;  Service: Endoscopy;  Laterality: Left;  . COLONOSCOPY WITH PROPOFOL N/A 04/05/2018   Procedure: COLONOSCOPY WITH PROPOFOL;  Surgeon: Virgel Manifold, MD;  Location: [facility] ENDOSCOPY;  Service: Endoscopy;  Laterality: N/A;  . CYSTOSCOPY WITH STENT PLACEMENT Right 09/26/2019   Procedure: CYSTOSCOPY WITH STENT PLACEMENT;  Surgeon: Royston Cowper, MD;  Location: [facility] ORS;  Service: Urology;  Laterality: Right;  . ENTEROSCOPY  01/03/2018   Procedure: ENTEROSCOPY;  Surgeon: Virgel Manifold, MD;  Location: [facility] ENDOSCOPY;  Service: Endoscopy;;  . ESOPHAGOGASTRODUODENOSCOPY N/A 01/02/2018   Procedure: ESOPHAGOGASTRODUODENOSCOPY (EGD);  Surgeon: Virgel Manifold, MD;  Location: [facility] ENDOSCOPY;  Service: Endoscopy;  Laterality: N/A;  . ESOPHAGOGASTRODUODENOSCOPY (EGD) WITH  PROPOFOL N/A 04/05/2018   Procedure: ESOPHAGOGASTRODUODENOSCOPY (EGD) WITH PROPOFOL;  Surgeon: Virgel Manifold, MD;  Location: [facility] ENDOSCOPY;  Service: Endoscopy;  Laterality: N/A;  . exploration and neurolysis of right peroneal nerve at the knee Right 08/27/2018  . GREEN LIGHT LASER TURP (TRANSURETHRAL RESECTION OF PROSTATE N/A 09/26/2019   Procedure: GREEN LIGHT LASER TURP (TRANSURETHRAL RESECTION OF PROSTATE;  Surgeon: Royston Cowper, MD;  Location: [facility] ORS;  Service: Urology;  Laterality: N/A;  . HERNIA REPAIR     1980's    There were no vitals filed for this visit.   Subjective Assessment - 04/07/20 1112    Subjective Pt is noticing less leakage during day but more leakage at night. L ankle is sore when he goes to get up and then once he stand up and walk, the soreness goes away. The first few steps are terrible with ankle soreness.  Pt reports his R shoulder is alot better and he is able to reach behind his neck which was somwething he was not able to do even after completing PT in the past.    Patient is accompained by: Family member   wife   Pertinent History see Hx,    Patient Stated Goals to wear no more Depends and improve erections              [facility] PT Assessment - 04/07/20 1117      Observation/Other Assessments   Observations B pronation, collapsed arches,     Scoliosis shoulders and  iliac crest levelled, no spinal curves       Single Leg Stance   Comments difficulty B without UE support, post Tx      Sit to Stand   Comments        AROM   Overall AROM Comments standing DF: 85 deg L, 70 deg R, ( Post Tx: B 70 deg)  OKC: DF 5 deg B ( Post Tx 20 deg)  , standing hip ext 20 deg B , toes ext  B 5 deg ( post Tx 30 deg)B       Palpation   Palpation comment hypomobile mid and hind joints B       Ambulation/Gait   Gait Comments pre Tx: short strides, minimal pus hoff ( post Tx) longer stride, increased hip flexion with cues, more push off, toe extension                           [facility] Adult PT Treatment/Exercise - 04/07/20 1117      Therapeutic Activites    Other Therapeutic Activities explained improved gait mechanics for longer stride which will help pelvic floor mobility and plan to address kegels ( endurance) at next session       Neuro Re-ed    Neuro Re-ed Details  cued for DF , push off, hip flexion in gait for longer strides, cued for sit to stand with nmore DF adn proper knee alignment         Manual Therapy   Manual therapy comments distraction at talocrural joint, Superior/ AP/PA mob at tib fib joint and midfoot joints B to rpomote DF     Kinesiotex Facilitate Muscle      Kinesiotix   Facilitate Muscle  lift plantar arches                       PT Long Term Goals - 04/07/20 1150      PT LONG TERM GOAL #1   Title Pt will report higher score on FOTO Urinary from 40 pts to > 45 pts in order to promote continence    Time 10    Period Weeks    Status On-going      PT LONG TERM GOAL #2   Title Pt will demo levelled iliac crest and shoulders and less spinal deviations in order to progress to deep core strengthening and coordination and pelvic floor strengthening    Time 4    Period Weeks    Status Achieved      PT LONG TERM GOAL #3   Title Pt will demo increased strength in R hip flexion, hip ext, hip abd to > 4/5 in order to improve pelvic girdle strength for walking and contience    Time 8    Period Weeks    Status On-going      PT LONG TERM GOAL #4   Title Pt will show decreased abdominal separation from 3 fingers width above umbilicus to < 2 fingers width in order to promote IAP for continence    Time 6    Period Weeks    Status On-going      PT LONG TERM GOAL #5   Title Pt will report having better control of urine when removing penile clamp in order to improve hygiene    Time 6    Period Weeks    Status Achieved      PT LONG TERM GOAL #6  Title Pt will demo proper coordination and  contraction of pelvic floor quick contrations 10 reps, 1 sec, and  endurance contractions 3 sec, 3 reps in order to minimize leakage    Time 8    Period Weeks    Status On-going      PT LONG TERM GOAL #7   Title Pt will be referred for a sleep study to rule in or out OSA due to nocturia issues    Time 4    Period Weeks    Status On-going      PT LONG TERM GOAL #8   Title Pt will demo increased gait speed from 0.65 m/s to > 1.0 m/s to ambulate in the community safely with less risk for falls    Time 8    Period Weeks    Status On-going                 Plan - 04/07/20 1115    Clinical Impression Statement Pelvic and shoulder alignment remains levelled which has helped with knee and spinal alignment.  Addressed lower kinetic chain to promote longer stride , DF, push off, in order to increase pelvic floor mobility. Pt required manual Tx and cues for these improvements. Pt demo'd poor SLS balance which will improve with more strengthening in lower kinetic chain once pt retains increased DF and gains more lifted arches. Plan to progress to endurance pelvic floor strengthening at next session and more lower kinetic chain strengthening. Pt continues to benefit from skilled PT.   Interdisciplinary care concerns: DPT will communicate with PCP re: pt's risk factors for OSA ( nocturia, cardiovascular disease) and would benefit from sleep study.      Examination-Activity Limitations Continence    Stability/Clinical Decision Making Evolving/Moderate complexity    Rehab Potential Good    PT Frequency 1x / week    PT Duration Other (comment)   10   PT Treatment/Interventions ADLs/Self Care Home Management;Moist Heat;Gait training;Therapeutic activities;Therapeutic exercise;Balance training;Neuromuscular re-education;Patient/family education;Manual techniques;Passive range of motion;Manual lymph drainage    Consulted and Agree with Plan of Care Patient;Family member/caregiver    Family Member  Consulted wife           Patient will benefit from skilled therapeutic intervention in order to improve the following deficits and impairments:  Pain, Decreased range of motion, Decreased strength, Impaired perceived functional ability, Decreased mobility, Decreased activity tolerance, Decreased balance, Hypomobility, Decreased endurance, Decreased coordination, Abnormal gait, Difficulty walking, Postural dysfunction, Improper body mechanics  Visit Diagnosis: Muscle weakness (generalized)  Other abnormalities of gait and mobility  Diastasis recti     Problem List Patient Active Problem List   Diagnosis Date Noted  . Essential hypertension 03/12/2019  . Thoracic aortic aneurysm ([place]) 03/12/2019  . Abnormal chest CT 02/13/2019  . Difficulty walking 12/06/2018  . Muscle weakness 12/06/2018  . Cellulitis of lower leg 09/17/2018  . CLE (columnar lined esophagus)   . Special screening for malignant neoplasms, colon   . Benign neoplasm of ascending colon   . Polyp of sigmoid colon   . Diverticulosis of large intestine without diverticulitis   . Iron deficiency anemia due to chronic blood loss 02/18/2018  . Folate deficiency 02/14/2018  . B12 deficiency 01/10/2018  . Folic acid deficiency 09/11/8249  . 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 Helicobacter pylori gastritis (chronic gastritis) 01/10/2018  . Thrombocytopenia ([place]) 01/10/2018  . Internal hemorrhoids   . Acute gastrointestinal hemorrhage   . Stomach irritation   .  Duodenal nodule   . Melena 01/01/2018  . Neck pain on right side 03/23/2016  . History of tobacco abuse 03/23/2016  . Cervical lymphadenitis 03/23/2016  . 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 Allergic rhinitis 03/28/2015  . 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 Failure of erection  03/28/2015  . Acid reflux 03/28/2015  . Arthritis, degenerative 03/28/2015  . Change in blood platelet count 03/28/2015  . Temporary cerebral vascular dysfunction 03/28/2015  . Personal history of tobacco use, presenting hazards to health 03/28/2015    Jerl Mina ,PT, DPT, E-RYT  04/07/2020, 5:28 PM  [place] MAIN [facility] SERVICES [address] [place], [place], [address] Phone: [phone]   Fax:  [phone]  Name: JAMAINE QUINTIN MRN: [id number] Date of Birth: [birth year]

## 2020-04-07 NOTE — Patient Instructions (Signed)
   Feet slides :   Points of contact at sitting bones  Four points of contact of foot, Heel up, ankle not twist out Lower heel Four points of contact of foot, Slide foot back   Repeated with other foot   2 min ___  Lunge stretch with heel down, knee bents  To have longer strides when walking   Knee bends 20 reps   X 3 x day

## 2020-04-14 ENCOUNTER — Other Ambulatory Visit: Payer: Self-pay

## 2020-04-14 ENCOUNTER — Ambulatory Visit: Payer: Medicare Other | Admitting: Physical Therapy

## 2020-04-14 DIAGNOSIS — M6281 Muscle weakness (generalized): Secondary | ICD-10-CM | POA: Diagnosis not present

## 2020-04-14 DIAGNOSIS — R2689 Other abnormalities of gait and mobility: Secondary | ICD-10-CM

## 2020-04-14 DIAGNOSIS — M6208 Separation of muscle (nontraumatic), other site: Secondary | ICD-10-CM

## 2020-04-14 NOTE — Therapy (Signed)
[place] MAIN [facility] SERVICES [address] [place], [place], [address] Phone: [phone]   Fax:  [phone]  Physical Therapy Treatment  Patient Details  Name: Dwayne Thompson MRN: [id number] Date of Birth: 27-Mar-1946 Referring Provider (PT): Boneta Lucks   Encounter Date: 04/14/2020   PT End of Session - 04/14/20 1145    Visit Number 5    Number of Visits 10    Date for PT Re-Evaluation 05/26/20   eval 03/17/20   PT Start Time 1100    PT Stop Time 1155    PT Time Calculation (min) 55 min    Activity Tolerance Patient tolerated treatment well    Behavior During Therapy [facility] for tasks assessed/performed           Past Medical History:  Diagnosis Date  . Anemia   . Back pain   . BPH (benign prostatic hyperplasia)   . Cirrhosis ([place])   . COPD (chronic obstructive pulmonary disease) ([place])   . ED (erectile dysfunction)   . GERD (gastroesophageal reflux disease)   . Hyperlipidemia   . Hypertension     Past Surgical History:  Procedure Laterality Date  . COLONOSCOPY Left 01/03/2018   Procedure: COLONOSCOPY;  Surgeon: Virgel Manifold, MD;  Location: [facility] ENDOSCOPY;  Service: Endoscopy;  Laterality: Left;  . COLONOSCOPY WITH PROPOFOL N/A 04/05/2018   Procedure: COLONOSCOPY WITH PROPOFOL;  Surgeon: Virgel Manifold, MD;  Location: [facility] ENDOSCOPY;  Service: Endoscopy;  Laterality: N/A;  . CYSTOSCOPY WITH STENT PLACEMENT Right 09/26/2019   Procedure: CYSTOSCOPY WITH STENT PLACEMENT;  Surgeon: Royston Cowper, MD;  Location: [facility] ORS;  Service: Urology;  Laterality: Right;  . ENTEROSCOPY  01/03/2018   Procedure: ENTEROSCOPY;  Surgeon: Virgel Manifold, MD;  Location: [facility] ENDOSCOPY;  Service: Endoscopy;;  . ESOPHAGOGASTRODUODENOSCOPY N/A 01/02/2018   Procedure: ESOPHAGOGASTRODUODENOSCOPY (EGD);  Surgeon: Virgel Manifold, MD;  Location: [facility] ENDOSCOPY;  Service: Endoscopy;  Laterality: N/A;  . ESOPHAGOGASTRODUODENOSCOPY (EGD) WITH  PROPOFOL N/A 04/05/2018   Procedure: ESOPHAGOGASTRODUODENOSCOPY (EGD) WITH PROPOFOL;  Surgeon: Virgel Manifold, MD;  Location: [facility] ENDOSCOPY;  Service: Endoscopy;  Laterality: N/A;  . exploration and neurolysis of right peroneal nerve at the knee Right 08/27/2018  . GREEN LIGHT LASER TURP (TRANSURETHRAL RESECTION OF PROSTATE N/A 09/26/2019   Procedure: GREEN LIGHT LASER TURP (TRANSURETHRAL RESECTION OF PROSTATE;  Surgeon: Royston Cowper, MD;  Location: [facility] ORS;  Service: Urology;  Laterality: N/A;  . HERNIA REPAIR     1980's    There were no vitals filed for this visit.   Subjective Assessment - 04/14/20 1103    Subjective Pt has no complaints from last week    Patient is accompained by: Family member   wife   Pertinent History see Hx,    Patient Stated Goals to wear no more Depends and improve erections              [facility] PT Assessment - 04/14/20 1146      Observation/Other Assessments   Observations B pronation, collapsed arches,     Scoliosis shoulders and iliac crest levelled, no spinal curves       Step Up   Comments BUE on wall, weak PF propulsion       Step Down   Comments BUE on wall, poor eccentric control       Sit to Stand   Comments        AROM   Overall AROM Comments standing and OKC: increased DF  compared to last week      Palpation   Palpation comment hypomobile mid and hind joints B       Ambulation/Gait   Gait Comments pre Tx: short strides, minimal pus hoff ( post Tx) longer stride, increased hip flexion with cues, more push off, toe extension       6 minute walk test results    Aerobic Endurance Distance Walked 456                         [facility] Adult PT Treatment/Exercise - 04/14/20 1146      Therapeutic Activites    Other Therapeutic Activities 6 MWT and gait training with resistance band at waist for more anterior COM and gait mechanics detailed in Neuro-reedu       Neuro Re-ed    Neuro Re-ed Details  cued for longer  stride, increased hip flexion/ push off , cued for better knee alignment and toe extensions in progression of standing birddiog without UE support at counter                        PT Long Term Goals - 04/14/20 1152      PT LONG TERM GOAL #1   Title Pt will report higher score on FOTO Urinary from 40 pts to > 45 pts in order to promote continence    Time 10    Period Weeks    Status On-going      PT LONG TERM GOAL #2   Title Pt will demo levelled iliac crest and shoulders and less spinal deviations in order to progress to deep core strengthening and coordination and pelvic floor strengthening    Time 4    Period Weeks    Status Achieved      PT LONG TERM GOAL #3   Title Pt will demo increased strength in R hip flexion, hip ext, hip abd to > 4/5 in order to improve pelvic girdle strength for walking and contience    Time 8    Period Weeks    Status On-going      PT LONG TERM GOAL #4   Title Pt will show decreased abdominal separation from 3 fingers width above umbilicus to < 2 fingers width in order to promote IAP for continence    Time 6    Period Weeks    Status On-going      PT LONG TERM GOAL #5   Title Pt will report having better control of urine when removing penile clamp in order to improve hygiene    Time 6    Period Weeks    Status Achieved      Additional Long Term Goals   Additional Long Term Goals Yes      PT LONG TERM GOAL #6   Title Pt will demo proper coordination and contraction of pelvic floor quick contrations 10 reps, 1 sec, and  endurance contractions 3 sec, 3 reps in order to minimize leakage    Time 8    Period Weeks    Status On-going      PT LONG TERM GOAL #7   Title Pt will be referred for a sleep study to rule in or out OSA due to nocturia issues    Time 4    Period Weeks    Status On-going      PT LONG TERM GOAL #8   Title Pt will demo increased  gait speed from 0.65 m/s to > 1.0 m/s to ambulate in the community safely with  less risk for falls    Time 8    Period Weeks    Status On-going      PT LONG TERM GOAL  #9   TITLE Pt will increased distance from 456 ft in 6 MWT to > 800 ft in order to improve ambulation safely in community and minimize fear of falling    Time 6    Period Weeks    Status New    Target Date 05/26/20                 Plan - 04/14/20 1145    Clinical Impression Statement Pt demo'd improved toe extension/ DF ankle mobility which helped with his dynamic stability and thus was able to advance with standing birddog without UE support this week. Progressed pt to stepup and down with cues to address low arches and genu valgus. Pt performed 6 MWT without pain and demo'd improved gait mechanics after training. These orthopedic improvements will help with pelvic floor strengthening and decreased leakage. Pt required cues for anterior tilt of pelvis in seated position to minimize downward strain onto pelvic floor.  Pt continues to benefits from skilled PT    Examination-Activity Limitations Continence    Stability/Clinical Decision Making Evolving/Moderate complexity    Rehab Potential Good    PT Frequency 1x / week    PT Duration Other (comment)   10   PT Treatment/Interventions ADLs/Self Care Home Management;Moist Heat;Gait training;Therapeutic activities;Therapeutic exercise;Balance training;Neuromuscular re-education;Patient/family education;Manual techniques;Passive range of motion;Manual lymph drainage    Consulted and Agree with Plan of Care Patient;Family member/caregiver    Family Member Consulted wife           Patient will benefit from skilled therapeutic intervention in order to improve the following deficits and impairments:  Pain, Decreased range of motion, Decreased strength, Impaired perceived functional ability, Decreased mobility, Decreased activity tolerance, Decreased balance, Hypomobility, Decreased endurance, Decreased coordination, Abnormal gait, Difficulty walking,  Postural dysfunction, Improper body mechanics  Visit Diagnosis: Muscle weakness (generalized)  Other abnormalities of gait and mobility  Diastasis recti     Problem List Patient Active Problem List   Diagnosis Date Noted  . Essential hypertension 03/12/2019  . Thoracic aortic aneurysm ([place]) 03/12/2019  . Abnormal chest CT 02/13/2019  . Difficulty walking 12/06/2018  . Muscle weakness 12/06/2018  . Cellulitis of lower leg 09/17/2018  . CLE (columnar lined esophagus)   . Special screening for malignant neoplasms, colon   . Benign neoplasm of ascending colon   . Polyp of sigmoid colon   . Diverticulosis of large intestine without diverticulitis   . Iron deficiency anemia due to chronic blood loss 02/18/2018  . Folate deficiency 02/14/2018  . B12 deficiency 01/10/2018  . Folic acid deficiency 81/19/1478  . 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 Helicobacter pylori gastritis (chronic gastritis) 01/10/2018  . Thrombocytopenia ([place]) 01/10/2018  . Internal hemorrhoids   . Acute gastrointestinal hemorrhage   . Stomach irritation   . Duodenal nodule   . Melena 01/01/2018  . Neck pain on right side 03/23/2016  . History of tobacco abuse 03/23/2016  . Cervical lymphadenitis 03/23/2016  . 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 Allergic rhinitis 03/28/2015  . 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 Failure of erection 03/28/2015  .  Acid reflux 03/28/2015  . Arthritis, degenerative 03/28/2015  . Change in blood platelet count 03/28/2015  . Temporary cerebral vascular dysfunction 03/28/2015  . Personal history of tobacco use, presenting hazards to health 03/28/2015    Jerl Mina ,PT, DPT, E-RYT  04/14/2020, 11:55 AM  [place], [place], [address] Phone: [phone]   Fax:  [phone]  Name: MAE CIANCI MRN: [id number] Date of Birth: 1946/07/02

## 2020-04-14 NOTE — Patient Instructions (Addendum)
Seated knee out with green band ,  hands by your side on the chair to sit tall, pressing through sitting bones and feet, not your tailbone ( no slouching)   20 reps   X 3   ___   Sitting with tall posture   ___    Walking with longer strides and push off with foot and knee high   6 minutes in hallway  X 2 x day  _________  Step ups on flat one exercise platform   Step up L up, R up, L down, R down, exhale up and down, lower heel slow and controlled, shoulder slightly forward, hands on the wall     center of mass moves after ballmounds contacts   Make sure opposite knee alignment is pointed out to prevent knee pain  10 x 3 reps each

## 2020-04-17 ENCOUNTER — Telehealth: Payer: Self-pay | Admitting: Physical Therapy

## 2020-04-17 ENCOUNTER — Telehealth: Payer: Self-pay | Admitting: Family Medicine

## 2020-04-17 NOTE — Telephone Encounter (Signed)
Please advise 

## 2020-04-17 NOTE — Telephone Encounter (Signed)
Please make appointment for patient to be seen in by me in the next month or 2 and get an Epworth done during that visit.

## 2020-04-17 NOTE — Telephone Encounter (Signed)
DPT left a message with staff at pt's PCP Dr. Rosanna Randy 's office re: pt's increased risk for OSA ( i.e. nocturia, HTN, high cholesterol) and would be benefit from sleep study.

## 2020-04-17 NOTE — Telephone Encounter (Signed)
Talmadge Chad, with [facility] rehab services, calling to request patient be screened for sleep study. She reports that patient is experiencing nocturia and that can be associated with sleep apnea. Talmadge Chad is requesting a call back, as she thinks this may be beneficial to patient.

## 2020-04-20 NOTE — Telephone Encounter (Signed)
Patient has a physical appt. 04/30/20 and he is to discuss having sleep study with Dr. Rosanna Randy then. cbe

## 2020-04-21 ENCOUNTER — Other Ambulatory Visit: Payer: Self-pay

## 2020-04-21 ENCOUNTER — Ambulatory Visit: Payer: Medicare Other | Attending: Urology | Admitting: Physical Therapy

## 2020-04-21 DIAGNOSIS — M6208 Separation of muscle (nontraumatic), other site: Secondary | ICD-10-CM | POA: Diagnosis present

## 2020-04-21 DIAGNOSIS — M6281 Muscle weakness (generalized): Secondary | ICD-10-CM | POA: Diagnosis present

## 2020-04-21 DIAGNOSIS — R2689 Other abnormalities of gait and mobility: Secondary | ICD-10-CM | POA: Insufficient documentation

## 2020-04-21 DIAGNOSIS — M533 Sacrococcygeal disorders, not elsewhere classified: Secondary | ICD-10-CM | POA: Insufficient documentation

## 2020-04-21 NOTE — Therapy (Addendum)
[place] MAIN [facility] SERVICES [address] [place], [place], [address] Phone: [phone]   Fax:  [phone]  Physical Therapy Treatment  Patient Details  Name: Dwayne Thompson MRN: [id number] Date of Birth: Dec 07, 1945 Referring Provider (PT): Boneta Lucks   Encounter Date: 04/21/2020   PT End of Session - 04/21/20 1154    Visit Number 6    Number of Visits 10    Date for PT Re-Evaluation 05/26/20   eval 03/17/20   PT Start Time 1102    PT Stop Time 1202    PT Time Calculation (min) 60 min    Activity Tolerance Patient tolerated treatment well    Behavior During Therapy [facility] for tasks assessed/performed           Past Medical History:  Diagnosis Date  . Anemia   . Back pain   . BPH (benign prostatic hyperplasia)   . Cirrhosis ([place])   . COPD (chronic obstructive pulmonary disease) ([place])   . ED (erectile dysfunction)   . GERD (gastroesophageal reflux disease)   . Hyperlipidemia   . Hypertension     Past Surgical History:  Procedure Laterality Date  . COLONOSCOPY Left 01/03/2018   Procedure: COLONOSCOPY;  Surgeon: Virgel Manifold, MD;  Location: [facility] ENDOSCOPY;  Service: Endoscopy;  Laterality: Left;  . COLONOSCOPY WITH PROPOFOL N/A 04/05/2018   Procedure: COLONOSCOPY WITH PROPOFOL;  Surgeon: Virgel Manifold, MD;  Location: [facility] ENDOSCOPY;  Service: Endoscopy;  Laterality: N/A;  . CYSTOSCOPY WITH STENT PLACEMENT Right 09/26/2019   Procedure: CYSTOSCOPY WITH STENT PLACEMENT;  Surgeon: Royston Cowper, MD;  Location: [facility] ORS;  Service: Urology;  Laterality: Right;  . ENTEROSCOPY  01/03/2018   Procedure: ENTEROSCOPY;  Surgeon: Virgel Manifold, MD;  Location: [facility] ENDOSCOPY;  Service: Endoscopy;;  . ESOPHAGOGASTRODUODENOSCOPY N/A 01/02/2018   Procedure: ESOPHAGOGASTRODUODENOSCOPY (EGD);  Surgeon: Virgel Manifold, MD;  Location: [facility] ENDOSCOPY;  Service: Endoscopy;  Laterality: N/A;  . ESOPHAGOGASTRODUODENOSCOPY (EGD) WITH  PROPOFOL N/A 04/05/2018   Procedure: ESOPHAGOGASTRODUODENOSCOPY (EGD) WITH PROPOFOL;  Surgeon: Virgel Manifold, MD;  Location: [facility] ENDOSCOPY;  Service: Endoscopy;  Laterality: N/A;  . exploration and neurolysis of right peroneal nerve at the knee Right 08/27/2018  . GREEN LIGHT LASER TURP (TRANSURETHRAL RESECTION OF PROSTATE N/A 09/26/2019   Procedure: GREEN LIGHT LASER TURP (TRANSURETHRAL RESECTION OF PROSTATE;  Surgeon: Royston Cowper, MD;  Location: [facility] ORS;  Service: Urology;  Laterality: N/A;  . HERNIA REPAIR     1980's    There were no vitals filed for this visit.   Subjective Assessment - 04/21/20 1108    Subjective Pt reported his PCP called to tell him the they will discuss about sleep study at his check up appt on 8/12 . Pt reports urine shoots out even with penile clamp on if he does not make it to the bathroom in time. Pt also reports he does not wear his clamp during the night.  Pt has had L flank pain under ribs to anterolateral area for one week ( achey pain 2/10 )  and it got worse ( 9/10)  last week for one night but now it is not as bad because he took tylenol. Pt thinks it may be due to laying on his L arm lying on his L side when sleeping. Denied nausea/ vomitting, chest pain, fever, no radiating pain to L shoulder blade/ arm.    Patient is accompained by: Family member   wife   Pertinent History  see Hx,    Patient Stated Goals to wear no more Depends and improve erections              [facility] PT Assessment - 04/21/20 1114      Strength   Overall Strength Comments B hip abd 3+/5, B hip ext B 3+/5                       Pelvic Floor Special Questions - 04/21/20 1120    Diastasis Recti 1 fingers width     External Perineal Exam through clothing: anterior pelvic floor mm tightness B, quick contraction with ab overuse.  Elicited better contraction with cue for contraction of anus and less ab.  9 reps, 1 sec              [facility] Adult PT  Treatment/Exercise - 04/21/20 1334      Therapeutic Activites    Other Therapeutic Activities explained about pelvic floor contraction and reassess strength, provided encouragement with DRA improvement and hip strength        Neuro Re-ed    Neuro Re-ed Details  excessive cues for less ab overuse for pelvic floor contractions , cued for pelvic floor stretches  , cued for log rolling to minimize straining pelvic floor and ab mm     Manual Therapy   Manual therapy comments STM/MWM at pelvic floor anterior mm B                        PT Long Term Goals - 04/14/20 1152      PT LONG TERM GOAL #1   Title Pt will report higher score on FOTO Urinary from 40 pts to > 45 pts in order to promote continence    Time 10    Period Weeks    Status On-going      PT LONG TERM GOAL #2   Title Pt will demo levelled iliac crest and shoulders and less spinal deviations in order to progress to deep core strengthening and coordination and pelvic floor strengthening    Time 4    Period Weeks    Status Achieved      PT LONG TERM GOAL #3   Title Pt will demo increased strength in R hip flexion, hip ext, hip abd to > 4/5 in order to improve pelvic girdle strength for walking and contience    Time 8    Period Weeks    Status On-going      PT LONG TERM GOAL #4   Title Pt will show decreased abdominal separation from 3 fingers width above umbilicus to < 2 fingers width in order to promote IAP for continence    Time 6    Period Weeks    Status On-going      PT LONG TERM GOAL #5   Title Pt will report having better control of urine when removing penile clamp in order to improve hygiene    Time 6    Period Weeks    Status Achieved      Additional Long Term Goals   Additional Long Term Goals Yes      PT LONG TERM GOAL #6   Title Pt will demo proper coordination and contraction of pelvic floor quick contrations 10 reps, 1 sec, and  endurance contractions 3 sec, 3 reps in order to minimize  leakage    Time 8    Period Weeks  Status On-going      PT LONG TERM GOAL #7   Title Pt will be referred for a sleep study to rule in or out OSA due to nocturia issues    Time 4    Period Weeks    Status On-going      PT LONG TERM GOAL #8   Title Pt will demo increased gait speed from 0.65 m/s to > 1.0 m/s to ambulate in the community safely with less risk for falls    Time 8    Period Weeks    Status On-going      PT LONG TERM GOAL  #9   TITLE Pt will increased distance from 456 ft in 6 MWT to > 800 ft in order to improve ambulation safely in community and minimize fear of falling    Time 6    Period Weeks    Status New    Target Date 05/26/20                 Plan - 04/21/20 1156    Clinical Impression Statement Pt is demonstrating increased IAP with decreased abdominal separation from 3 fingers width to 1 fingers width and stronger deep core and hip strength. Pt's gait is also improving with no more deviation to spine and pelvis.   Progressed pt to quick pelvic floor contractions but he required excessive cues for less ab mm overuse and required manual Tx to minimize anterior pelvic floor mm tightness. Required cues for log rolling to minimize straining pelvic floor and ab mm.  Pt continues to benefit from skilled PT   Concern:  Pt reported he has had L flank pain under ribs to anterolateral area for one week ( achey pain 2/10 )  and it got worse ( 9/10)  last week for one night but now it is not as bad because he took tylenol. Pt thinks it may be due to laying on his L arm lying on his L side when sleeping. Denied nausea/ vomitting, chest pain, fever, no radiating pain to L shoulder blade/ arm. Plan to monitor his Sx. Educated pt to seek immediate medical attention if pain worsens, explained red flag signs.       Examination-Activity Limitations Continence    Stability/Clinical Decision Making Evolving/Moderate complexity    Rehab Potential Good    PT Frequency 1x /  week    PT Duration Other (comment)   10   PT Treatment/Interventions ADLs/Self Care Home Management;Moist Heat;Gait training;Therapeutic activities;Therapeutic exercise;Balance training;Neuromuscular re-education;Patient/family education;Manual techniques;Passive range of motion;Manual lymph drainage    Consulted and Agree with Plan of Care Patient;Family member/caregiver    Family Member Consulted wife           Patient will benefit from skilled therapeutic intervention in order to improve the following deficits and impairments:  Pain, Decreased range of motion, Decreased strength, Impaired perceived functional ability, Decreased mobility, Decreased activity tolerance, Decreased balance, Hypomobility, Decreased endurance, Decreased coordination, Abnormal gait, Difficulty walking, Postural dysfunction, Improper body mechanics  Visit Diagnosis: Muscle weakness (generalized)  Other abnormalities of gait and mobility  Diastasis recti     Problem List Patient Active Problem List   Diagnosis Date Noted  . Essential hypertension 03/12/2019  . Thoracic aortic aneurysm ([place]) 03/12/2019  . Abnormal chest CT 02/13/2019  . Difficulty walking 12/06/2018  . Muscle weakness 12/06/2018  . Cellulitis of lower leg 09/17/2018  . CLE (columnar lined esophagus)   . Special screening for malignant neoplasms, colon   .  Benign neoplasm of ascending colon   . Polyp of sigmoid colon   . Diverticulosis of large intestine without diverticulitis   . Iron deficiency anemia due to chronic blood loss 02/18/2018  . Folate deficiency 02/14/2018  . B12 deficiency 01/10/2018  . Folic acid deficiency 91/50/5697  . 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 Helicobacter pylori gastritis (chronic gastritis) 01/10/2018  . Thrombocytopenia ([place]) 01/10/2018  . Internal hemorrhoids   . Acute gastrointestinal hemorrhage   . Stomach irritation   . Duodenal nodule   . Melena  01/01/2018  . Neck pain on right side 03/23/2016  . History of tobacco abuse 03/23/2016  . Cervical lymphadenitis 03/23/2016  . 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 Allergic rhinitis 03/28/2015  . 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 Failure of erection 03/28/2015  . Acid reflux 03/28/2015  . Arthritis, degenerative 03/28/2015  . Change in blood platelet count 03/28/2015  . Temporary cerebral vascular dysfunction 03/28/2015  . Personal history of tobacco use, presenting hazards to health 03/28/2015    Jerl Mina ,PT, DPT, E-RYT  04/21/2020, 1:37 PM  [place] MAIN [facility] SERVICES [address] [place], [place], [address] Phone: [phone]   Fax:  [phone]  Name: ANGELICA WIX MRN: [id number] Date of Birth: Sep 13, 1946

## 2020-04-21 NOTE — Patient Instructions (Signed)
Pelvic floor stretches:   Drop one knee out, slide foot up and down  10 reps   Switch.  ___  PELVIC FLOOR / KEGEL EXERCISES   Pelvic floor/ Kegel exercises are used to strengthen the muscles in the base of your pelvis that are responsible for supporting your pelvic organs and preventing urine/feces leakage. Based on your therapist's recommendations, they can be performed while standing, sitting, or lying down.  Make yourself aware of this muscle group by using these cues:  Imagine you are in a crowded room and you feel the need to pass gas. Your response is to pull up and in at the rectum.  Close the rectum. Pull the muscles up inside your body,feeling your scrotum lifting as well . Feel the pelvic floor muscles lift as if you were walking into a cold lake.  Place your hand on top of your pubic bone. Tighten and draw in the muscles around the anal muscles without squeezing the buttock muscles.  Common Errors:  Breath holding: If you are holding your breath, you may be bearing down against your bladder instead of pulling it up. If you belly bulges up while you are squeezing, you are holding your breath. Be sure to breathe gently in and out while exercising. Counting out loud may help you avoid holding your breath.  Accessory muscle use: You should not see or feel other muscle movement when performing pelvic floor exercises. When done properly, no one can tell that you are performing the exercises. Keep the buttocks, belly and inner thighs relaxed.  Overdoing it: Your muscles can fatigue and stop working for you if you over-exercise. You may actually leak more or feel soreness at the lower abdomen or rectum.  YOUR HOME EXERCISE PROGRAM    SHORT HOLDS: Position: on back   Inhale and then exhale. Then squeeze the muscle.  (Be sure to let belly sink in with exhales and not push outward)  Perform 9 repetitions  4   Times/day    After breakfast, lunch, dinner, mid afternoon     **ALSO SQUEEZE BEFORE YOUR SNEEZE, COUGH, LAUGH to decrease downward pressure   **ALSO EXHALE BEFORE YOU RISE AGAINST GRAVITY (lifting, sit to stand, from squat to stand)

## 2020-04-23 ENCOUNTER — Other Ambulatory Visit: Payer: Self-pay

## 2020-04-23 ENCOUNTER — Ambulatory Visit
Admission: RE | Admit: 2020-04-23 | Discharge: 2020-04-23 | Disposition: A | Discharge status: Home / Self Care | Payer: Medicare Other | Attending: Physician Assistant | Admitting: Physician Assistant

## 2020-04-23 ENCOUNTER — Ambulatory Visit
Admission: RE | Admit: 2020-04-23 | Discharge: 2020-04-23 | Disposition: A | Discharge status: Home / Self Care | Payer: Medicare Other | Source: Ambulatory Visit | Attending: Physician Assistant | Admitting: Physician Assistant

## 2020-04-23 ENCOUNTER — Ambulatory Visit (INDEPENDENT_AMBULATORY_CARE_PROVIDER_SITE_OTHER): Payer: Medicare Other | Admitting: Physician Assistant

## 2020-04-23 ENCOUNTER — Telehealth: Payer: Self-pay

## 2020-04-23 ENCOUNTER — Encounter: Payer: Medicare Other | Admitting: Family Medicine

## 2020-04-23 ENCOUNTER — Encounter: Payer: Self-pay | Admitting: Physician Assistant

## 2020-04-23 VITALS — BP 115/75 | HR 73 | Temp 98.1°F | Wt 205.6 lb

## 2020-04-23 DIAGNOSIS — R109 Unspecified abdominal pain: Secondary | ICD-10-CM | POA: Insufficient documentation

## 2020-04-23 DIAGNOSIS — R1012 Left upper quadrant pain: Secondary | ICD-10-CM | POA: Diagnosis not present

## 2020-04-23 LAB — POCT URINALYSIS DIPSTICK
Bilirubin, UA: NEGATIVE
Blood, UA: NEGATIVE
Glucose, UA: NEGATIVE
Ketones, UA: NEGATIVE
Leukocytes, UA: NEGATIVE
Nitrite, UA: NEGATIVE
Protein, UA: NEGATIVE
Spec Grav, UA: 1.015 (ref 1.010–1.025)
Urobilinogen, UA: 2 E.U./dL — AB
pH, UA: 6 (ref 5.0–8.0)

## 2020-04-23 MED ORDER — CIPROFLOXACIN HCL 500 MG PO TABS: 500.0000 mg | ORAL_TABLET | Freq: Two times a day (BID) | ORAL | 0 refills | Status: DC

## 2020-04-23 NOTE — Assessment & Plan Note (Signed)
Take cipro BID for 3 days Ordered xray to rule out kidney stones Consider ct scan Follow up with Rosanna Randy next week.

## 2020-04-23 NOTE — Telephone Encounter (Signed)
He can use a capful of miralax daily in liquid of his choice. He and his wife can let us know if he wants me to order the CT scan.

## 2020-04-23 NOTE — Telephone Encounter (Signed)
Patient and patient's spouse advised to try miralax, she said that they would like to proceed with the Ct scan and that he is not taking Cardura. Patient would also like to know if you are still going to send in the pain medication for him.

## 2020-04-23 NOTE — Telephone Encounter (Signed)
-----   Message from Trinna Post, [place] sent at 04/23/2020  3:55 PM EDT ----- Can we call and let patient know there was a large amount of stool in his intestines but no sign of blockage. No evidence of kidney stones on xray. We can proceed with the CT scan to evaluate further if he is agreeable.

## 2020-04-23 NOTE — Progress Notes (Signed)
Established patient visit   Patient: Dwayne Thompson   DOB: [birth year]   74 y.o. Male  MRN: [id number] Visit Date: 04/23/2020  Today's healthcare provider: Trinna Post, PA-C   Chief Complaint  Patient presents with  . Flank Pain   Subjective    Flank Pain This is a new problem. The current episode started 1 to 4 weeks ago. The problem occurs daily. The problem has been gradually worsening since onset. The quality of the pain is described as aching. The pain does not radiate. The pain is at a severity of 8/10. The pain is worse during the night. The symptoms are aggravated by lying down. Pertinent negatives include no abdominal pain, chest pain, dysuria, fever, headaches, leg pain, numbness, pelvic pain or tingling. Treatments tried: Tylenol extra strength. The treatment provided mild relief.    Patient says that it is not related to eating or urinating. Not having trouble eating or breathing.       Medications: Outpatient Medications Prior to Visit  Medication Sig  . acetaminophen (TYLENOL) 325 MG tablet Take 2 tablets (650 mg total) by mouth 3 (three) times daily. (Patient taking differently: Take 650 mg by mouth every 6 (six) hours as needed for moderate pain. )  . cetirizine (ZYRTEC) 10 MG tablet Take 1 tablet (10 mg total) by mouth daily.  . cholecalciferol (VITAMIN D3) 25 MCG (1000 UNIT) tablet Take 1,000 Units by mouth daily.  . cyclobenzaprine (FLEXERIL) 10 MG tablet TAKE ONE TABLET BY MOUTH TWICE A DAY AS NEEDED AND TAKE ONE TABLET BY MOUTH EVERY EVENING  . docusate sodium (COLACE) 100 MG capsule Take 2 capsules (200 mg total) by mouth 2 (two) times daily.  Marland Kitchen dutasteride (AVODART) 0.5 MG capsule Take 0.5 mg by mouth daily.  . folic acid (FOLVITE) 1 MG tablet Take 1 tablet (1 mg total) by mouth daily.  Marland Kitchen lisinopril (ZESTRIL) 10 MG tablet Take 1 tablet (10 mg total) by mouth daily.  . pantoprazole (PROTONIX) 20 MG tablet TAKE ONE TABLET BY MOUTH TWICE A DAY  . potassium  chloride (K-DUR,KLOR-CON) 10 MEQ tablet Take 1 tablet (10 mEq total) by mouth 2 (two) times daily.  . rosuvastatin (CRESTOR) 10 MG tablet Take 1 tablet (10 mg total) by mouth daily.  . valACYclovir (VALTREX) 1000 MG tablet Take 1 tablet (1,000 mg total) by mouth 3 (three) times daily. (Patient not taking: Reported on 05/14/2020)  . vitamin B-12 1000 MCG tablet Take 1 tablet (1,000 mcg total) by mouth daily.  . [DISCONTINUED] doxazosin (CARDURA) 4 MG tablet TAKE 1 TABLET DAILY  . AMBULATORY NON FORMULARY MEDICATION Trimix (30 mg PAPA, 1 mL phen, 50 mg prostaglandin)   Vial 64ml   Dose 0.91mcg  Qty 10 #6 refills  [facility] [phone] Fax [phone] (Patient not taking: Reported on 05/14/2020)  . oxyCODONE-acetaminophen (PERCOCET) 5-325 MG tablet Take 1 tablet by mouth every 4 (four) hours as needed for moderate pain or severe pain.  . tadalafil (ADCIRCA/CIALIS) 20 MG tablet Take 1 tablet (20 mg total) by mouth daily as needed for erectile dysfunction. (Patient not taking: Reported on 05/14/2020)  . [DISCONTINUED] amoxicillin-clavulanate (AUGMENTIN) 500-125 MG tablet Take 1 tablet by mouth 2 (two) times daily.  . [DISCONTINUED] ciprofloxacin (CIPRO) 500 MG tablet Take 1 tablet (500 mg total) by mouth 2 (two) times daily.  . [DISCONTINUED] Meth-Hyo-M Bl-Na Phos-Ph Sal (URIBEL) 118 MG CAPS Take 1 capsule (118 mg total) by mouth every 6 (six) hours as needed. (  Patient not taking: Reported on 02/19/2020)  . [DISCONTINUED] montelukast (SINGULAIR) 10 MG tablet Take 1 tablet (10 mg total) by mouth daily.   No facility-administered medications prior to visit.    Review of Systems  Constitutional: Negative for fever.  Cardiovascular: Negative for chest pain.  Gastrointestinal: Negative for abdominal pain.  Genitourinary: Positive for flank pain. Negative for dysuria and pelvic pain.  Neurological: Negative for tingling, numbness and headaches.      Objective    BP 115/75 (BP  Location: Right Arm, Patient Position: Sitting, Cuff Size: Normal)   Pulse 73   Temp 98.1 F (36.7 C) (Oral)   Wt 205 lb 9.6 oz (93.3 kg)   BMI 28.68 kg/m    Physical Exam Vitals reviewed.  Constitutional:      Appearance: Normal appearance.  HENT:     Head: Normocephalic and atraumatic.     Right Ear: External ear normal.     Left Ear: External ear normal.  Eyes:     General: No scleral icterus.    Conjunctiva/sclera: Conjunctivae normal.  Cardiovascular:     Rate and Rhythm: Normal rate and regular rhythm.     Pulses: Normal pulses.     Heart sounds: Normal heart sounds.  Pulmonary:     Effort: Pulmonary effort is normal.     Breath sounds: Normal breath sounds.  Abdominal:     General: Bowel sounds are normal.     Palpations: Abdomen is soft.     Tenderness: There is abdominal tenderness. There is left CVA tenderness. There is no right CVA tenderness or rebound.  Musculoskeletal:     Right lower leg: No edema.     Left lower leg: No edema.  Skin:    General: Skin is warm and dry.  Neurological:     General: No focal deficit present.     Mental Status: He is alert and oriented to person, place, and time.  Psychiatric:        Mood and Affect: Mood normal.        Behavior: Behavior normal.        Thought Content: Thought content normal.        Judgment: Judgment normal.       Results for orders placed or performed in visit on 04/23/20  Urine Culture   Specimen: Urine   UR  Result Value Ref Range   Urine Culture, Routine Final report    Organism ID, Bacteria Comment   POCT urinalysis dipstick  Result Value Ref Range   Color, UA     Clarity, UA     Glucose, UA Negative Negative   Bilirubin, UA negative    Ketones, UA negative    Spec Grav, UA 1.015 1.010 - 1.025   Blood, UA negative    pH, UA 6.0 5.0 - 8.0   Protein, UA Negative Negative   Urobilinogen, UA 2.0 (A) 0.2 or 1.0 E.U./dL   Nitrite, UA negative    Leukocytes, UA Negative Negative    Appearance     Odor      Assessment & Plan     Problem List Items Addressed This Visit      Other   Left flank pain - Primary    Take cipro BID for 3 days Ordered xray to rule out kidney stones Consider ct scan Follow up with Rosanna Randy next week.       Relevant Medications   doxazosin (CARDURA) 2 MG tablet   Other Relevant Orders  POCT urinalysis dipstick (Completed)   Urine Culture (Completed)   DG Abd 2 Views (Completed)    Other Visit Diagnoses    Left upper quadrant abdominal pain       Relevant Orders   CT RENAL STONE STUDY (Completed)       Return if symptoms worsen or fail to improve.      ITrinna Post, PA-C, have reviewed all documentation for this visit. The documentation on 05/20/20 for the exam, diagnosis, procedures, and orders are all accurate and complete.  The entirety of the information documented in the History of Present Illness, Review of Systems and Physical Exam were personally obtained by me. Portions of this information were initially documented by Elonda Husky, CMA and reviewed by me for thoroughness and accuracy.   I spent 30 minutes dedicated to the care of this patient on the date of this encounter to include pre-visit review of records, face-to-face time with the patient discussing flank pain etiologies, and post visit ordering of testing.    Paulene Floor  [facility] [phone] (phone) [phone] (fax)  [place]

## 2020-04-23 NOTE — Telephone Encounter (Signed)
Patient would like to know what he could do to help the large amount of stool in his intestines. Please advise?

## 2020-04-24 MED ORDER — DOXAZOSIN MESYLATE 2 MG PO TABS: 2.0000 mg | ORAL_TABLET | Freq: Every day | ORAL | 0 refills | Status: DC

## 2020-04-24 MED ORDER — HYDROCODONE-ACETAMINOPHEN 5-325 MG PO TABS: 1.0000 | ORAL_TABLET | Freq: Two times a day (BID) | ORAL | 0 refills | Status: AC

## 2020-04-24 NOTE — Telephone Encounter (Signed)
Called to advise patient as below. LVMTCB.

## 2020-04-24 NOTE — Telephone Encounter (Signed)
I've ordered the CT scan. I sent a small course of pain pills which can worsen constipation so take sparingly. I also sent in 30 days of Cardura 2 mg daily which can help to pass a stone. He can try this medication as well. If his BP goes too low or if he feels dizzy he should stop it.

## 2020-04-25 LAB — URINE CULTURE

## 2020-04-28 ENCOUNTER — Ambulatory Visit: Payer: Medicare Other | Admitting: Physical Therapy

## 2020-04-28 ENCOUNTER — Other Ambulatory Visit: Payer: Self-pay

## 2020-04-28 DIAGNOSIS — M6208 Separation of muscle (nontraumatic), other site: Secondary | ICD-10-CM

## 2020-04-28 DIAGNOSIS — R2689 Other abnormalities of gait and mobility: Secondary | ICD-10-CM

## 2020-04-28 DIAGNOSIS — M6281 Muscle weakness (generalized): Secondary | ICD-10-CM

## 2020-04-28 NOTE — Patient Instructions (Signed)
PELVIC FLOOR / KEGEL EXERCISES   Pelvic floor/ Kegel exercises are used to strengthen the muscles in the base of your pelvis that are responsible for supporting your pelvic organs and preventing urine/feces leakage. Based on your therapist's recommendations, they can be performed while standing, sitting, or lying down.  Make yourself aware of this muscle group by using these cues:  Imagine you are in a crowded room and you feel the need to pass gas. Your response is to pull up and in at the rectum.  Close the rectum. Pull the muscles up inside your body,feeling your scrotum lifting as well . Feel the pelvic floor muscles lift as if you were walking into a cold lake.  Place your hand on top of your pubic bone. Tighten and draw in the muscles around the anal muscles without squeezing the buttock muscles.  Common Errors:  Breath holding: If you are holding your breath, you may be bearing down against your bladder instead of pulling it up. If you belly bulges up while you are squeezing, you are holding your breath. Be sure to breathe gently in and out while exercising. Counting out loud may help you avoid holding your breath.  Accessory muscle use: You should not see or feel other muscle movement when performing pelvic floor exercises. When done properly, no one can tell that you are performing the exercises. Keep the buttocks, belly and inner thighs relaxed.  Overdoing it: Your muscles can fatigue and stop working for you if you over-exercise. You may actually leak more or feel soreness at the lower abdomen or rectum.     SHORT HOLDS: Position: on back, sitting   Inhale and then exhale. Then squeeze the muscle.  (Be sure to let belly sink in with exhales and not push outward)  Perform 8 repetitions, 5 different Times/day  Morning, mid morning, lunch, mid afternoon, dinner)  Without penile clmap.    **ALSO SQUEEZE BEFORE YOUR SNEEZE, COUGH, LAUGH to decrease downward pressure   **ALSO  EXHALE BEFORE YOU RISE AGAINST GRAVITY (lifting, sit to stand, from squat to stand)

## 2020-04-28 NOTE — Progress Notes (Signed)
I,April Miller,acting as a scribe for [name], MD.,have documented all relevant documentation on the behalf of [name], MD,as directed by  [name], MD while in the presence of [name], MD.   Complete physical exam   Patient: Dwayne Thompson   DOB: 30-Dec-1945   74 y.o. Male  MRN: [id number] Visit Date: 04/30/2020  Today's healthcare provider: [name], MD   Chief Complaint  Patient presents with  . Annual Exam   Subjective    Dwayne Thompson is a 74 y.o. male who presents today for a complete physical exam.  He reports consuming a general diet. The patient does not participate in regular exercise at present. He generally feels poorly. He reports sleeping fairly well. He does not have additional problems to discuss today.  HPI  Patient has AWV with NHA today at 9:20 am.  Past Medical History:  Diagnosis Date  . Anemia   . Back pain   . BPH (benign prostatic hyperplasia)   . Cirrhosis ([place])   . COPD (chronic obstructive pulmonary disease) ([place])   . ED (erectile dysfunction)   . GERD (gastroesophageal reflux disease)   . Hyperlipidemia   . Hypertension    Past Surgical History:  Procedure Laterality Date  . COLONOSCOPY Left 01/03/2018   Procedure: COLONOSCOPY;  Surgeon: Virgel Manifold, MD;  Location: [facility] ENDOSCOPY;  Service: Endoscopy;  Laterality: Left;  . COLONOSCOPY WITH PROPOFOL N/A 04/05/2018   Procedure: COLONOSCOPY WITH PROPOFOL;  Surgeon: Virgel Manifold, MD;  Location: [facility] ENDOSCOPY;  Service: Endoscopy;  Laterality: N/A;  . CYSTOSCOPY WITH STENT PLACEMENT Right 09/26/2019   Procedure: CYSTOSCOPY WITH STENT PLACEMENT;  Surgeon: Royston Cowper, MD;  Location: [facility] ORS;  Service: Urology;  Laterality: Right;  . ENTEROSCOPY  01/03/2018   Procedure: ENTEROSCOPY;  Surgeon: Virgel Manifold, MD;  Location: [facility] ENDOSCOPY;  Service: Endoscopy;;  . ESOPHAGOGASTRODUODENOSCOPY N/A 01/02/2018   Procedure:  ESOPHAGOGASTRODUODENOSCOPY (EGD);  Surgeon: Virgel Manifold, MD;  Location: [facility] ENDOSCOPY;  Service: Endoscopy;  Laterality: N/A;  . ESOPHAGOGASTRODUODENOSCOPY (EGD) WITH PROPOFOL N/A 04/05/2018   Procedure: ESOPHAGOGASTRODUODENOSCOPY (EGD) WITH PROPOFOL;  Surgeon: Virgel Manifold, MD;  Location: [facility] ENDOSCOPY;  Service: Endoscopy;  Laterality: N/A;  . exploration and neurolysis of right peroneal nerve at the knee Right 08/27/2018  . GREEN LIGHT LASER TURP (TRANSURETHRAL RESECTION OF PROSTATE N/A 09/26/2019   Procedure: GREEN LIGHT LASER TURP (TRANSURETHRAL RESECTION OF PROSTATE;  Surgeon: Royston Cowper, MD;  Location: [facility] ORS;  Service: Urology;  Laterality: N/A;  . HERNIA REPAIR     1980's   Social History   Socioeconomic History  . Marital status: Married    Spouse name: [name]  . Number of children: 3  . Years of education: 77  . Highest education level: 12th grade  Occupational History  . Occupation: retired  Tobacco Use  . Smoking status: Former Smoker    Packs/day: 1.00    Years: 50.00    Pack years: 50.00    Types: Cigarettes    Quit date: 10/20/2013    Years since quitting: 6.5  . Smokeless tobacco: Never Used  Vaping Use  . Vaping Use: Never used  Substance and Sexual Activity  . Alcohol use: Yes    Alcohol/week: 21.0 - 28.0 standard drinks    Types: 21 - 28 Shots of liquor per week    Comment: 3-4 gin and sodas a day  . Drug use: No  .  Sexual activity: Not Currently  Other Topics Concern  . Not on file  Social History Narrative  . Not on file   Social Determinants of Health   Financial Resource Strain: Low Risk   . Difficulty of Paying Living Expenses: Not hard at all  Food Insecurity: No Food Insecurity  . Worried About Charity fundraiser in the Last Year: Never true  . Ran Out of Food in the Last Year: Never true  Transportation Needs: No Transportation Needs  . Lack of Transportation (Medical): No  . Lack of Transportation (Non-Medical): No   Physical Activity: Insufficiently Active  . Days of Exercise per Week: 1 day  . Minutes of Exercise per Session: 60 min  Stress: No Stress Concern Present  . Feeling of Stress : Not at all  Social Connections: Moderately Integrated  . Frequency of Communication with Friends and Family: Three times a week  . Frequency of Social Gatherings with Friends and Family: Once a week  . Attends Religious Services: Never  . Active Member of Clubs or Organizations: Yes  . Attends Archivist Meetings: 1 to 4 times per year  . Marital Status: Married  Human resources officer Violence: Not At Risk  . Fear of Current or Ex-Partner: No  . Emotionally Abused: No  . Physically Abused: No  . Sexually Abused: No   Family Status  Relation Name Status  . Mother  Deceased  . Sister  Deceased at age 51       sarcoidosis  . Brother  Deceased  . Brother  Alive  . Father  Deceased  . Brother  Alive  . Brother  Deceased  . Daughter  Alive  . Daughter  Alive  . Daughter  Alive  . Brother  Alive  . Neg Hx  (Not Specified)   Family History  Problem Relation Age of Onset  . Alzheimer's disease Mother   . Sarcoidosis Sister   . Anxiety disorder Brother   . Kidney disease Neg Hx   . Prostate cancer Neg Hx    Allergies  Allergen Reactions  . Tamsulosin Hcl Hives  . Sulfa Antibiotics Rash    Patient Care Team: Jerrol Banana., MD as PCP - General (Family Medicine) Virgel Manifold, MD as Consulting Physician (Gastroenterology) Lequita Asal, MD as Referring Physician (Hematology and Oncology) Tyler Pita, MD as Consulting Physician (Pulmonary Disease) Dimmig, Marcello Moores, MD as Referring Physician (Orthopedic Surgery) Royston Cowper, MD as Consulting Physician (Urology)   Medications: Outpatient Medications Prior to Visit  Medication Sig  . acetaminophen (TYLENOL) 325 MG tablet Take 2 tablets (650 mg total) by mouth 3 (three) times daily. (Patient taking differently:  Take 650 mg by mouth every 6 (six) hours as needed for moderate pain. )  . AMBULATORY NON FORMULARY MEDICATION Trimix (30 mg PAPA, 1 mL phen, 50 mg prostaglandin)   Vial 45ml   Dose 0.7mcg  Qty 10 #6 refills  [place] [phone] Fax [phone] (Patient not taking: Reported on 09/16/2019)  . cetirizine (ZYRTEC) 10 MG tablet Take 1 tablet (10 mg total) by mouth daily.  . cholecalciferol (VITAMIN D3) 25 MCG (1000 UNIT) tablet Take 1,000 Units by mouth daily.  . ciprofloxacin (CIPRO) 500 MG tablet Take 1 tablet (500 mg total) by mouth 2 (two) times daily. (Patient not taking: Reported on 01/27/2020)  . ciprofloxacin (CIPRO) 500 MG tablet Take 1 tablet (500 mg total) by mouth 2 (two) times daily. (Patient not taking: Reported  on 04/30/2020)  . cyclobenzaprine (FLEXERIL) 10 MG tablet TAKE ONE TABLET BY MOUTH TWICE A DAY AS NEEDED AND TAKE ONE TABLET BY MOUTH EVERY EVENING  . docusate sodium (COLACE) 100 MG capsule Take 2 capsules (200 mg total) by mouth 2 (two) times daily.  Marland Kitchen doxazosin (CARDURA) 2 MG tablet Take 1 tablet (2 mg total) by mouth daily. (Patient not taking: Reported on 04/30/2020)  . dutasteride (AVODART) 0.5 MG capsule Take 0.5 mg by mouth daily.  . folic acid (FOLVITE) 1 MG tablet Take 1 tablet (1 mg total) by mouth daily.  Marland Kitchen lisinopril (ZESTRIL) 10 MG tablet Take 1 tablet (10 mg total) by mouth daily.  . montelukast (SINGULAIR) 10 MG tablet Take 1 tablet (10 mg total) by mouth daily.  Marland Kitchen oxyCODONE-acetaminophen (PERCOCET) 5-325 MG tablet Take 1 tablet by mouth every 4 (four) hours as needed for moderate pain or severe pain. (Patient not taking: Reported on 01/27/2020)  . pantoprazole (PROTONIX) 20 MG tablet TAKE ONE TABLET BY MOUTH TWICE A DAY  . potassium chloride (K-DUR,KLOR-CON) 10 MEQ tablet Take 1 tablet (10 mEq total) by mouth 2 (two) times daily.  . rosuvastatin (CRESTOR) 10 MG tablet Take 1 tablet (10 mg total) by mouth daily.  . tadalafil (ADCIRCA/CIALIS) 20  MG tablet Take 1 tablet (20 mg total) by mouth daily as needed for erectile dysfunction. (Patient not taking: Reported on 09/16/2019)  . valACYclovir (VALTREX) 1000 MG tablet Take 1 tablet (1,000 mg total) by mouth 3 (three) times daily.  . vitamin B-12 1000 MCG tablet Take 1 tablet (1,000 mcg total) by mouth daily.  . [DISCONTINUED] HYDROcodone-acetaminophen (NORCO/VICODIN) 5-325 MG tablet Take 1 tablet by mouth every 12 (twelve) hours as needed for moderate pain (x 5 days). Prescirbed by Carles Collet   No facility-administered medications prior to visit.    Review of Systems  Gastrointestinal: Positive for abdominal pain.       Pain in left upper quadrant with no aggravating or alleviating circumstances.  Endocrine: Positive for polyuria.  Genitourinary: Positive for enuresis.  All other systems reviewed and are negative.     Objective    BP 98/61 (BP Location: Left Arm, Patient Position: Sitting, Cuff Size: Large)   Pulse 74   Temp 98.7 F (37.1 C) (Oral)   Resp 16   Ht 5\' 11"  (1.803 m)   Wt 204 lb (92.5 kg)   SpO2 96%   BMI 28.45 kg/m    Results of the Epworth flowsheet 04/30/2020  Sitting and reading 0  Watching TV 1  Sitting, inactive in a public place (e.g. a theatre or a meeting) 0  As a passenger in a car for an hour without a break 1  Lying down to rest in the afternoon when circumstances permit 0  Sitting and talking to someone 0  Sitting quietly after a lunch without alcohol 1  In a car, while stopped for a few minutes in traffic 0  Total score 3    Physical Exam Vitals reviewed.  Constitutional:      Appearance: Normal appearance.  Cardiovascular:     Rate and Rhythm: Normal rate and regular rhythm.     Heart sounds: Normal heart sounds.  Pulmonary:     Effort: Pulmonary effort is normal.     Breath sounds: Normal breath sounds.  Abdominal:     Palpations: Abdomen is soft.     Comments: Mild tenderness without guarding or rebound the left upper  quadrant.  Skin:  General: Skin is warm and dry.  Neurological:     Mental Status: He is alert.      General Appearance:     Well developed, well nourished male. Alert, cooperative, in no acute distress, appears stated age  Head:    Normocephalic, without obvious abnormality, atraumatic  Eyes:    PERRL, conjunctiva/corneas clear, EOM's intact, fundi    benign, both eyes       Ears:    Normal TM's and external ear canals, both ears  Nose:   Nares normal, septum midline, mucosa normal, no drainage   or sinus tenderness  Throat:   Lips, mucosa, and tongue normal; teeth and gums normal  Neck:   Supple, symmetrical, trachea midline, no adenopathy;       thyroid:  No enlargement/tenderness/nodules; no carotid   bruit or JVD  Back:     Symmetric, no curvature, ROM normal, no CVA tenderness  Lungs:     Clear to auscultation bilaterally, respirations unlabored  Chest wall:    No tenderness or deformity  Heart:    Normal heart rate. Normal rhythm. No murmurs, rubs, or gallops.  S1 and S2 normal  Abdomen:     Soft, non-tender, bowel sounds active all four quadrants,    no masses, no organomegaly  Genitalia:    deferred  Rectal:    deferred  Extremities:   All extremities are intact. No cyanosis or edema  Pulses:   2+ and symmetric all extremities  Skin:   Skin color, texture, turgor normal, no rashes or lesions  Lymph nodes:   Cervical, supraclavicular, and axillary nodes normal  Neurologic:   CNII-XII intact. Normal strength, sensation and reflexes      throughout     Last depression screening scores PHQ 2/9 Scores 04/30/2020 04/23/2019 04/17/2018  PHQ - 2 Score 0 0 0  PHQ- 9 Score - - -   Last fall risk screening Fall Risk  04/30/2020  Falls in the past year? 1  Number falls in past yr: 0  Injury with Fall? 0  Follow up Falls prevention discussed   Last Audit-C alcohol use screening Alcohol Use Disorder Test (AUDIT) 04/30/2020  1. How often do you have a drink containing  alcohol? 4  2. How many drinks containing alcohol do you have on a typical day when you are drinking? 1  3. How often do you have six or more drinks on one occasion? 0  AUDIT-C Score 5  4. How often during the last year have you found that you were not able to stop drinking once you had started? 0  5. How often during the last year have you failed to do what was normally expected from you because of drinking? 0  6. How often during the last year have you needed a first drink in the morning to get yourself going after a heavy drinking session? 0  7. How often during the last year have you had a feeling of guilt of remorse after drinking? 0  8. How often during the last year have you been unable to remember what happened the night before because you had been drinking? 0  9. Have you or someone else been injured as a result of your drinking? 0  10. Has a relative or friend or a doctor or another health worker been concerned about your drinking or suggested you cut down? 0  Alcohol Use Disorder Identification Test Final Score (AUDIT) 5  Alcohol Brief Interventions/Follow-up Brief Advice;AUDIT Score <  7 follow-up not indicated;Continued Monitoring   A score of 3 or more in women, and 4 or more in men indicates increased risk for alcohol abuse, EXCEPT if all of the points are from question 1   No results found for any visits on 04/30/20.  Assessment & Plan    Routine Health Maintenance and Physical Exam  Exercise Activities and Dietary recommendations Goals    . DIET - INCREASE WATER INTAKE     Recommend increasing water intake to 3 glasses a day.    . Prevent falls     Recommend to remove any items from the home that may cause slips or trips.    . Reduce alcohol intake     Recommend to cut back on alcohol intake by half and only consuming 1-2 glasses a day instead of 3-4.        Immunization History  Administered Date(s) Administered  . Fluad Quad(high Dose 65+) 07/02/2019  .  Hepatitis A, Adult 03/27/2019, 09/30/2019  . Hepatitis B, adult 03/27/2019, 07/29/2019, 09/30/2019  . Influenza, High Dose Seasonal PF 08/19/2015, 09/02/2016, 10/03/2017, 05/31/2018  . Influenza,inj,quad, With Preservative 05/20/2018  . Moderna SARS-COVID-2 Vaccination 11/16/2019, 12/14/2019  . Pneumococcal Conjugate-13 02/06/2014  . Pneumococcal Polysaccharide-23 03/05/2012  . Pneumococcal-Unspecified 09/19/2014  . Tdap 04/28/2007  . Zoster 12/19/2011    Health Maintenance  Topic Date Due  . INFLUENZA VACCINE  04/19/2020  . TETANUS/TDAP  04/30/2021 (Originally 04/27/2017)  . COLONOSCOPY  04/06/2023  . COVID-19 Vaccine  Completed  . Hepatitis C Screening  Completed  . PNA vac Low Risk Adult  Completed    Discussed health benefits of physical activity, and encouraged him to engage in regular exercise appropriate for his age and condition.  1. Annual physical exam  - CBC w/Diff/Platelet - Lipid panel - Comprehensive Metabolic Panel (CMET)  2. Left flank pain CT pending.  Patient on pantoprazole. - HYDROcodone-acetaminophen (NORCO/VICODIN) 5-325 MG tablet; Take 1 tablet by mouth every 6 (six) hours as needed for moderate pain (x 5 days). Prescirbed by Carles Collet  Dispense: 30 tablet; Refill: 0 - CBC w/Diff/Platelet - Lipid panel - Comprehensive Metabolic Panel (CMET)  3. Essential hypertension  - CBC w/Diff/Platelet - Lipid panel - Comprehensive Metabolic Panel (CMET)  4. Hyperlipidemia, unspecified hyperlipidemia type  - CBC w/Diff/Platelet - Lipid panel - Comprehensive Metabolic Panel (CMET)   Return in about 2 weeks (around 05/14/2020).        Jovanny Stephanie Cranford Mon, MD  [facility] [phone] (phone) [phone] (fax)  [place]

## 2020-04-29 NOTE — Therapy (Signed)
[place] MAIN [facility] SERVICES [address] [place], [place], [address] Phone: [phone]   Fax:  [phone]  Physical Therapy Treatment  Patient Details  Name: Dwayne Thompson MRN: [id number] Date of Birth: 11-25-1945 Referring Provider (PT): Boneta Lucks   Encounter Date: 04/28/2020   PT End of Session - 04/29/20 1357    Visit Number 7    Number of Visits 10    Date for PT Re-Evaluation 05/26/20   eval 03/17/20   PT Start Time 1100    PT Stop Time 1155    PT Time Calculation (min) 55 min    Activity Tolerance Patient tolerated treatment well    Behavior During Therapy [facility] for tasks assessed/performed           Past Medical History:  Diagnosis Date   Anemia    Back pain    BPH (benign prostatic hyperplasia)    Cirrhosis (HCC)    COPD (chronic obstructive pulmonary disease) ([place])    ED (erectile dysfunction)    GERD (gastroesophageal reflux disease)    Hyperlipidemia    Hypertension     Past Surgical History:  Procedure Laterality Date   COLONOSCOPY Left 01/03/2018   Procedure: COLONOSCOPY;  Surgeon: Virgel Manifold, MD;  Location: [facility] ENDOSCOPY;  Service: Endoscopy;  Laterality: Left;   COLONOSCOPY WITH PROPOFOL N/A 04/05/2018   Procedure: COLONOSCOPY WITH PROPOFOL;  Surgeon: Virgel Manifold, MD;  Location: [facility] ENDOSCOPY;  Service: Endoscopy;  Laterality: N/A;   CYSTOSCOPY WITH STENT PLACEMENT Right 09/26/2019   Procedure: CYSTOSCOPY WITH STENT PLACEMENT;  Surgeon: Royston Cowper, MD;  Location: [facility] ORS;  Service: Urology;  Laterality: Right;   ENTEROSCOPY  01/03/2018   Procedure: ENTEROSCOPY;  Surgeon: Virgel Manifold, MD;  Location: [facility] ENDOSCOPY;  Service: Endoscopy;;   ESOPHAGOGASTRODUODENOSCOPY N/A 01/02/2018   Procedure: ESOPHAGOGASTRODUODENOSCOPY (EGD);  Surgeon: Virgel Manifold, MD;  Location: [facility] ENDOSCOPY;  Service: Endoscopy;  Laterality: N/A;   ESOPHAGOGASTRODUODENOSCOPY (EGD) WITH  PROPOFOL N/A 04/05/2018   Procedure: ESOPHAGOGASTRODUODENOSCOPY (EGD) WITH PROPOFOL;  Surgeon: Virgel Manifold, MD;  Location: [facility] ENDOSCOPY;  Service: Endoscopy;  Laterality: N/A;   exploration and neurolysis of right peroneal nerve at the knee Right 08/27/2018   GREEN LIGHT LASER TURP (TRANSURETHRAL RESECTION OF PROSTATE N/A 09/26/2019   Procedure: GREEN LIGHT LASER TURP (TRANSURETHRAL RESECTION OF PROSTATE;  Surgeon: Royston Cowper, MD;  Location: [facility] ORS;  Service: Urology;  Laterality: N/A;   HERNIA REPAIR     1980's    There were no vitals filed for this visit.   Subjective Assessment - 04/28/20 1109    Subjective Pt noticed he gets up pee at night 3x a night currently instead of 4-5 x night. The dripping is still occuring when he does not have the penile clamp on. Pt saw his doctor about the R flank pain and he will udnergo CTscan to r/o  kidney stone. Xrays did not show anything. This pain still comes on when he is sitting. The PT exercise do not cause increased pain at the R flank.    Patient is accompained by: Family member   wife   Pertinent History see Hx,    Patient Stated Goals to wear no more Depends and improve erections              [facility] PT Assessment - 04/29/20 1408      Strength   Overall Strength Comments B hip abd 4-/5 B  Observations: no deviations to spine, shoulders, pelvis  levelled            Pelvic Floor Special Questions - 04/29/20 1408    External Perineal Exam without clothing:  tight anterior mm R ( decreased post Tx ) more activation.  8 reps, 1 sec holds , excessive cues to not strain with ab mm overuse    noted several urine drips on chunk pad            [facility] Adult PT Treatment/Exercise - 04/29/20 1358      Therapeutic Activites    Other Therapeutic Activities reassessed hip strength, explained external assessment without penile clamp , provided chuck pad       Neuro Re-ed    Neuro Re-ed Details  excessive  cues to not strain with ab mm, sequential contraction       Manual Therapy   Manual therapy comments STM/MWM at R deep transverse perineal / obt  int                         PT Long Term Goals - 04/14/20 1152      PT LONG TERM GOAL #1   Title Pt will report higher score on FOTO Urinary from 40 pts to > 45 pts in order to promote continence    Time 10    Period Weeks    Status On-going      PT LONG TERM GOAL #2   Title Pt will demo levelled iliac crest and shoulders and less spinal deviations in order to progress to deep core strengthening and coordination and pelvic floor strengthening    Time 4    Period Weeks    Status Achieved      PT LONG TERM GOAL #3   Title Pt will demo increased strength in R hip flexion, hip ext, hip abd to > 4/5 in order to improve pelvic girdle strength for walking and contience    Time 8    Period Weeks    Status On-going      PT LONG TERM GOAL #4   Title Pt will show decreased abdominal separation from 3 fingers width above umbilicus to < 2 fingers width in order to promote IAP for continence    Time 6    Period Weeks    Status On-going      PT LONG TERM GOAL #5   Title Pt will report having better control of urine when removing penile clamp in order to improve hygiene    Time 6    Period Weeks    Status Achieved      Additional Long Term Goals   Additional Long Term Goals Yes      PT LONG TERM GOAL #6   Title Pt will demo proper coordination and contraction of pelvic floor quick contrations 10 reps, 1 sec, and  endurance contractions 3 sec, 3 reps in order to minimize leakage    Time 8    Period Weeks    Status On-going      PT LONG TERM GOAL #7   Title Pt will be referred for a sleep study to rule in or out OSA due to nocturia issues    Time 4    Period Weeks    Status On-going      PT LONG TERM GOAL #8   Title Pt will demo increased gait speed from 0.65 m/s to > 1.0 m/s to ambulate in the  community safely with less  risk for falls    Time 8    Period Weeks    Status On-going      PT LONG TERM GOAL  #9   TITLE Pt will increased distance from 456 ft in 6 MWT to > 800 ft in order to improve ambulation safely in community and minimize fear of falling    Time 6    Period Weeks    Status New    Target Date 05/26/20                 Plan - 04/29/20 1358    Clinical Impression Statement Assessed pelvic floor externally without penile clamp. Pt showed increased R pelvic floor mm which required manual Tx to minimize. Following Tx, pt required excessive cues for pelvic floor contraction with less ab overuse. Pt demo'd correct technique post Tx. Hip strength is improving. Pt continues to benefit from skilled PT    Examination-Activity Limitations Continence    Stability/Clinical Decision Making Evolving/Moderate complexity    Rehab Potential Good    PT Frequency 1x / week    PT Duration Other (comment)   10   PT Treatment/Interventions ADLs/Self Care Home Management;Moist Heat;Gait training;Therapeutic activities;Therapeutic exercise;Balance training;Neuromuscular re-education;Patient/family education;Manual techniques;Passive range of motion;Manual lymph drainage    Consulted and Agree with Plan of Care Patient;Family member/caregiver    Family Member Consulted wife           Patient will benefit from skilled therapeutic intervention in order to improve the following deficits and impairments:  Pain, Decreased range of motion, Decreased strength, Impaired perceived functional ability, Decreased mobility, Decreased activity tolerance, Decreased balance, Hypomobility, Decreased endurance, Decreased coordination, Abnormal gait, Difficulty walking, Postural dysfunction, Improper body mechanics  Visit Diagnosis: Muscle weakness (generalized)  Other abnormalities of gait and mobility  Diastasis recti     Problem List Patient Active Problem List   Diagnosis Date Noted   Left flank pain 04/23/2020    Essential hypertension 03/12/2019   Thoracic aortic aneurysm ([place]) 03/12/2019   Abnormal chest CT 02/13/2019   Difficulty walking 12/06/2018   Muscle weakness 12/06/2018   Cellulitis of lower leg 09/17/2018   CLE (columnar lined esophagus)    Special screening for malignant neoplasms, colon    Benign neoplasm of ascending colon    Polyp of sigmoid colon    Diverticulosis of large intestine without diverticulitis    Iron deficiency anemia due to chronic blood loss 02/18/2018   Folate deficiency 02/14/2018   B12 deficiency 27/78/2423   Folic acid deficiency 53/61/4431   AVM (arteriovenous malformation) of small bowel, acquired 54/00/8676   Helicobacter pylori gastritis (chronic gastritis) 01/10/2018   Thrombocytopenia ([place]) 01/10/2018   Internal hemorrhoids    Acute gastrointestinal hemorrhage    Stomach irritation    Duodenal nodule    Melena 01/01/2018   Neck pain on right side 03/23/2016   History of tobacco abuse 03/23/2016   Cervical lymphadenitis 03/23/2016   BPH with obstruction/lower urinary tract symptoms 12/24/2015   Erectile dysfunction of organic origin 12/24/2015   Degenerative arthritis of lumbar spine 04/29/2015   Allergic rhinitis 03/28/2015   Benign fibroma of prostate 03/28/2015   Failure of erection 03/28/2015   Acid reflux 03/28/2015   Arthritis, degenerative 03/28/2015   Change in blood platelet count 03/28/2015   Temporary cerebral vascular dysfunction 03/28/2015   Personal history of tobacco use, presenting hazards to health 03/28/2015    Jerl Mina ,PT, DPT, E-RYT  04/29/2020, 2:18 PM  [place] MAIN [facility] SERVICES [address] [place], [place], [address] Phone: [phone]   Fax:  [phone]  Name: TYDARIUS YAWN MRN: [id number] Date of Birth: Feb 21, 1946

## 2020-04-29 NOTE — Progress Notes (Signed)
Subjective:   Dwayne Thompson is a 74 y.o. male who presents for Medicare Annual/Subsequent preventive examination.  I connected with [name] today by telephone and verified that I am speaking with the correct person using two identifiers. Location patient: home Location provider: work Persons participating in the virtual visit: patient, provider.   I discussed the limitations, risks, security and privacy concerns of performing an evaluation and management service by telephone and the availability of in person appointments. I also discussed with the patient that there may be a patient responsible charge related to this service. The patient expressed understanding and verbally consented to this telephonic visit.    Interactive audio and video telecommunications were attempted between this provider and patient, however failed, due to patient having technical difficulties OR patient did not have access to video capability.  We continued and completed visit with audio only.   Review of Systems    N/A  Cardiac Risk Factors include: advanced age (>39men, >53 women);male gender;hypertension;sedentary lifestyle     Objective:    Today's Vitals   04/30/20 0913  PainSc: 1    There is no height or weight on file to calculate BMI.  Advanced Directives 04/30/2020 03/17/2020 09/26/2019 09/16/2019 08/21/2019 04/23/2019 02/13/2019  Does Patient Have a Medical Advance Directive? No No No No No No No  Type of Advance Directive - - - - - - -  Does patient want to make changes to medical advance directive? - - - - - - -  Would patient like information on creating a medical advance directive? - - No - Patient declined No - Patient declined - No - Patient declined No - Patient declined    Current Medications (verified) Outpatient Encounter Medications as of 04/30/2020  Medication Sig  . acetaminophen (TYLENOL) 325 MG tablet Take 2 tablets (650 mg total) by mouth 3 (three) times daily. (Patient taking  differently: Take 650 mg by mouth every 6 (six) hours as needed for moderate pain. )  . AMBULATORY NON FORMULARY MEDICATION Trimix (30 mg PAPA, 1 mL phen, 50 mg prostaglandin)   Vial 80ml   Dose 0.96mcg  Qty 10 #6 refills  [facility] [phone] Fax [phone] (Patient not taking: Reported on 09/16/2019)  . cetirizine (ZYRTEC) 10 MG tablet Take 1 tablet (10 mg total) by mouth daily.  . cholecalciferol (VITAMIN D3) 25 MCG (1000 UNIT) tablet Take 1,000 Units by mouth daily.  . ciprofloxacin (CIPRO) 500 MG tablet Take 1 tablet (500 mg total) by mouth 2 (two) times daily. (Patient not taking: Reported on 01/27/2020)  . ciprofloxacin (CIPRO) 500 MG tablet Take 1 tablet (500 mg total) by mouth 2 (two) times daily. (Patient not taking: Reported on 04/30/2020)  . cyclobenzaprine (FLEXERIL) 10 MG tablet TAKE ONE TABLET BY MOUTH TWICE A DAY AS NEEDED AND TAKE ONE TABLET BY MOUTH EVERY EVENING  . docusate sodium (COLACE) 100 MG capsule Take 2 capsules (200 mg total) by mouth 2 (two) times daily.  Marland Kitchen doxazosin (CARDURA) 2 MG tablet Take 1 tablet (2 mg total) by mouth daily. (Patient not taking: Reported on 04/30/2020)  . dutasteride (AVODART) 0.5 MG capsule Take 0.5 mg by mouth daily.  . folic acid (FOLVITE) 1 MG tablet Take 1 tablet (1 mg total) by mouth daily.  . [EXPIRED] HYDROcodone-acetaminophen (NORCO/VICODIN) 5-325 MG tablet Take 1 tablet by mouth every 12 (twelve) hours for 5 days.  Marland Kitchen HYDROcodone-acetaminophen (NORCO/VICODIN) 5-325 MG tablet Take 1 tablet by mouth every 12 (twelve) hours as  needed for moderate pain (x 5 days). Prescirbed by Carles Collet  . lisinopril (ZESTRIL) 10 MG tablet Take 1 tablet (10 mg total) by mouth daily.  . montelukast (SINGULAIR) 10 MG tablet Take 1 tablet (10 mg total) by mouth daily.  Marland Kitchen oxyCODONE-acetaminophen (PERCOCET) 5-325 MG tablet Take 1 tablet by mouth every 4 (four) hours as needed for moderate pain or severe pain. (Patient not taking: Reported  on 01/27/2020)  . pantoprazole (PROTONIX) 20 MG tablet TAKE ONE TABLET BY MOUTH TWICE A DAY  . potassium chloride (K-DUR,KLOR-CON) 10 MEQ tablet Take 1 tablet (10 mEq total) by mouth 2 (two) times daily.  . rosuvastatin (CRESTOR) 10 MG tablet Take 1 tablet (10 mg total) by mouth daily.  . tadalafil (ADCIRCA/CIALIS) 20 MG tablet Take 1 tablet (20 mg total) by mouth daily as needed for erectile dysfunction. (Patient not taking: Reported on 09/16/2019)  . valACYclovir (VALTREX) 1000 MG tablet Take 1 tablet (1,000 mg total) by mouth 3 (three) times daily.  . vitamin B-12 1000 MCG tablet Take 1 tablet (1,000 mcg total) by mouth daily.   No facility-administered encounter medications on file as of 04/30/2020.    Allergies (verified) Tamsulosin hcl and Sulfa antibiotics   History: Past Medical History:  Diagnosis Date  . Anemia   . Back pain   . BPH (benign prostatic hyperplasia)   . Cirrhosis ([place])   . COPD (chronic obstructive pulmonary disease) ([place])   . ED (erectile dysfunction)   . GERD (gastroesophageal reflux disease)   . Hyperlipidemia   . Hypertension    Past Surgical History:  Procedure Laterality Date  . COLONOSCOPY Left 01/03/2018   Procedure: COLONOSCOPY;  Surgeon: Virgel Manifold, MD;  Location: [facility] ENDOSCOPY;  Service: Endoscopy;  Laterality: Left;  . COLONOSCOPY WITH PROPOFOL N/A 04/05/2018   Procedure: COLONOSCOPY WITH PROPOFOL;  Surgeon: Virgel Manifold, MD;  Location: [facility] ENDOSCOPY;  Service: Endoscopy;  Laterality: N/A;  . CYSTOSCOPY WITH STENT PLACEMENT Right 09/26/2019   Procedure: CYSTOSCOPY WITH STENT PLACEMENT;  Surgeon: Royston Cowper, MD;  Location: [facility] ORS;  Service: Urology;  Laterality: Right;  . ENTEROSCOPY  01/03/2018   Procedure: ENTEROSCOPY;  Surgeon: Virgel Manifold, MD;  Location: [facility] ENDOSCOPY;  Service: Endoscopy;;  . ESOPHAGOGASTRODUODENOSCOPY N/A 01/02/2018   Procedure: ESOPHAGOGASTRODUODENOSCOPY (EGD);  Surgeon: Virgel Manifold, MD;  Location: [facility] ENDOSCOPY;  Service: Endoscopy;  Laterality: N/A;  . ESOPHAGOGASTRODUODENOSCOPY (EGD) WITH PROPOFOL N/A 04/05/2018   Procedure: ESOPHAGOGASTRODUODENOSCOPY (EGD) WITH PROPOFOL;  Surgeon: Virgel Manifold, MD;  Location: [facility] ENDOSCOPY;  Service: Endoscopy;  Laterality: N/A;  . exploration and neurolysis of right peroneal nerve at the knee Right 08/27/2018  . GREEN LIGHT LASER TURP (TRANSURETHRAL RESECTION OF PROSTATE N/A 09/26/2019   Procedure: GREEN LIGHT LASER TURP (TRANSURETHRAL RESECTION OF PROSTATE;  Surgeon: Royston Cowper, MD;  Location: [facility] ORS;  Service: Urology;  Laterality: N/A;  . HERNIA REPAIR     1980's   Family History  Problem Relation Age of Onset  . Alzheimer's disease Mother   . Sarcoidosis Sister   . Anxiety disorder Brother   . Kidney disease Neg Hx   . Prostate cancer Neg Hx    Social History   Socioeconomic History  . Marital status: Married    Spouse name: [name]  . Number of children: 3  . Years of education: 88  . Highest education level: 12th grade  Occupational History  . Occupation: retired  Tobacco Use  . Smoking status: Former Smoker  Packs/day: 1.00    Years: 50.00    Pack years: 50.00    Types: Cigarettes    Quit date: 10/20/2013    Years since quitting: 6.5  . Smokeless tobacco: Never Used  Vaping Use  . Vaping Use: Never used  Substance and Sexual Activity  . Alcohol use: Yes    Alcohol/week: 21.0 - 28.0 standard drinks    Types: 21 - 28 Shots of liquor per week    Comment: 3-4 gin and sodas a day  . Drug use: No  . Sexual activity: Not Currently  Other Topics Concern  . Not on file  Social History Narrative  . Not on file   Social Determinants of Health   Financial Resource Strain: Low Risk   . Difficulty of Paying Living Expenses: Not hard at all  Food Insecurity: No Food Insecurity  . Worried About Charity fundraiser in the Last Year: Never true  . Ran Out of Food in the Last Year: Never true    Transportation Needs: No Transportation Needs  . Lack of Transportation (Medical): No  . Lack of Transportation (Non-Medical): No  Physical Activity: Insufficiently Active  . Days of Exercise per Week: 1 day  . Minutes of Exercise per Session: 60 min  Stress: No Stress Concern Present  . Feeling of Stress : Not at all  Social Connections: Moderately Integrated  . Frequency of Communication with Friends and Family: Three times a week  . Frequency of Social Gatherings with Friends and Family: Once a week  . Attends Religious Services: Never  . Active Member of Clubs or Organizations: Yes  . Attends Archivist Meetings: 1 to 4 times per year  . Marital Status: Married    Tobacco Counseling Counseling given: Not Answered   Clinical Intake:  Pre-visit preparation completed: Yes  Pain : 0-10 Pain Score: 1  Pain Type: Acute pain (Left side pain x 3 weeks. More painful at night. Pt has seen Adriana for concern last week. CT was ordered.) Pain Location: Back Pain Orientation: Mid Pain Descriptors / Indicators: Aching Pain Onset: 1 to 4 weeks ago Pain Frequency: Intermittent Pain Relieving Factors: Taking Tylenol for pain.  Pain Relieving Factors: Taking Tylenol for pain.  Nutritional Risks: None Diabetes: No  How often do you need to have someone help you when you read instructions, pamphlets, or other written materials from your doctor or pharmacy?: 1 - Never  Diabetic? No  Interpreter Needed?: No  Information entered by :: [facility], LPN   Activities of Daily Living In your present state of health, do you have any difficulty performing the following activities: 04/30/2020 09/16/2019  Hearing? N -  Vision? N -  Comment Wears eye glasses. -  Difficulty concentrating or making decisions? N -  Walking or climbing stairs? N N  Dressing or bathing? N -  Doing errands, shopping? N N  Preparing Food and eating ? N -  Using the Toilet? N -  In the past six  months, have you accidently leaked urine? Y -  Comment Due to previous laser sx on his prostate. -  Do you have problems with loss of bowel control? N -  Managing your Medications? N -  Managing your Finances? N -  Housekeeping or managing your Housekeeping? N -  Some recent data might be hidden    Patient Care Team: Jerrol Banana., MD as PCP - General (Family Medicine) Virgel Manifold, MD as Consulting Physician (Gastroenterology) Mike Gip,  Drue Second, MD as Referring Physician (Hematology and Oncology) Tyler Pita, MD as Consulting Physician (Pulmonary Disease) Dimmig, Marcello Moores, MD as Referring Physician (Orthopedic Surgery) Royston Cowper, MD as Consulting Physician (Urology)  Indicate any recent Medical Services you may have received from other than [facility] providers in the past year (date may be approximate).     Assessment:   This is a routine wellness examination for [place].  Hearing/Vision screen No exam data present  Dietary issues and exercise activities discussed: Current Exercise Habits: Structured exercise class (PT x 1 a week), Type of exercise: stretching, Time (Minutes): 60, Frequency (Times/Week): 1, Weekly Exercise (Minutes/Week): 60, Exercise limited by: orthopedic condition(s)  Goals    . DIET - INCREASE WATER INTAKE     Recommend increasing water intake to 3 glasses a day.    . Prevent falls     Recommend to remove any items from the home that may cause slips or trips.    . Reduce alcohol intake     Recommend to cut back on alcohol intake by half and only consuming 1-2 glasses a day instead of 3-4.       Depression Screen PHQ 2/9 Scores 04/30/2020 04/23/2019 04/17/2018 12/04/2017 11/28/2016 04/29/2015  PHQ - 2 Score 0 0 0 0 0 0  PHQ- 9 Score - - - 0 0 -    Fall Risk Fall Risk  04/30/2020 04/23/2019 04/17/2018 12/04/2017 11/28/2016  Falls in the past year? 1 0 No No No  Number falls in past yr: 0 - - - -  Injury with Fall? 0 - - - -  Follow up  Falls prevention discussed - - - -    Any stairs in or around the home? Yes  If so, are there any without handrails? No  Home free of loose throw rugs in walkways, pet beds, electrical cords, etc? Yes  Adequate lighting in your home to reduce risk of falls? Yes   ASSISTIVE DEVICES UTILIZED TO PREVENT FALLS:  Life alert? No  Use of a cane, walker or w/c? No  Grab bars in the bathroom? No  Shower chair or bench in shower? No  Elevated toilet seat or a handicapped toilet? Yes    Cognitive Function: Declined today.      6CIT Screen 11/28/2016  What Year? 0 points  What month? 0 points  What time? 0 points  Count back from 20 0 points  Months in reverse 2 points  Repeat phrase 4 points  Total Score 6    Immunizations Immunization History  Administered Date(s) Administered  . Fluad Quad(high Dose 65+) 07/02/2019  . Hepatitis A, Adult 03/27/2019, 09/30/2019  . Hepatitis B, adult 03/27/2019, 07/29/2019, 09/30/2019  . Influenza, High Dose Seasonal PF 08/19/2015, 09/02/2016, 10/03/2017, 05/31/2018  . Influenza,inj,quad, With Preservative 05/20/2018  . Moderna SARS-COVID-2 Vaccination 11/16/2019, 12/14/2019  . Pneumococcal Conjugate-13 02/06/2014  . Pneumococcal Polysaccharide-23 03/05/2012  . Pneumococcal-Unspecified 09/19/2014  . Tdap 04/28/2007  . Zoster 12/19/2011    TDAP status: Due, Education has been provided regarding the importance of this vaccine. Advised may receive this vaccine at local pharmacy or Health Dept. Aware to provide a copy of the vaccination record if obtained from local pharmacy or Health Dept. Verbalized acceptance and understanding. Flu Vaccine status: Up to date Pneumococcal vaccine status: Up to date Covid-19 vaccine status: Completed vaccines  Qualifies for Shingles Vaccine? Yes   Zostavax completed Yes   Shingrix Completed?: No.    Education has been provided regarding the importance  of this vaccine. Patient has been advised to call insurance  company to determine out of pocket expense if they have not yet received this vaccine. Advised may also receive vaccine at local pharmacy or Health Dept. Verbalized acceptance and understanding.  Screening Tests Health Maintenance  Topic Date Due  . INFLUENZA VACCINE  04/19/2020  . TETANUS/TDAP  04/30/2021 (Originally 04/27/2017)  . COLONOSCOPY  04/06/2023  . COVID-19 Vaccine  Completed  . Hepatitis C Screening  Completed  . PNA vac Low Risk Adult  Completed    Health Maintenance  Health Maintenance Due  Topic Date Due  . INFLUENZA VACCINE  04/19/2020    Colorectal cancer screening: Completed 04/05/18. Repeat every 5 years  Lung Cancer Screening: (Low Dose CT Chest recommended if Age 3-80 years, 30 pack-year currently smoking OR have quit w/in 15years.) does qualify, however a CT of the chest was completed 08/2019. Repeat yearly.  Additional Screening:  Hepatitis C Screening: Up to date  Vision Screening: Recommended annual ophthalmology exams for early detection of glaucoma and other disorders of the eye. Is the patient up to date with their annual eye exam?  Yes  Who is the provider or what is the name of the office in which the patient attends annual eye exams? [facility] [employer] [facility] If pt is not established with a provider, would they like to be referred to a provider to establish care? No .   Dental Screening: Recommended annual dental exams for proper oral hygiene  Community Resource Referral / Chronic Care Management: CRR required this visit?  No   CCM required this visit?  No      Plan:     I have personally reviewed and noted the following in the patient's chart:   . Medical and social history . Use of alcohol, tobacco or illicit drugs  . Current medications and supplements . Functional ability and status . Nutritional status . Physical activity . Advanced directives . List of other physicians . Hospitalizations, surgeries, and ER visits in previous 12  months . Vitals . Screenings to include cognitive, depression, and falls . Referrals and appointments  In addition, I have reviewed and discussed with patient certain preventive protocols, quality metrics, and best practice recommendations. A written personalized care plan for preventive services as well as general preventive health recommendations were provided to patient.     Billy Rocco [place], [place]   1/94/[id number]   Nurse Notes: None.

## 2020-04-30 ENCOUNTER — Ambulatory Visit (INDEPENDENT_AMBULATORY_CARE_PROVIDER_SITE_OTHER): Payer: Medicare Other

## 2020-04-30 ENCOUNTER — Encounter: Payer: Self-pay | Admitting: Family Medicine

## 2020-04-30 ENCOUNTER — Ambulatory Visit (INDEPENDENT_AMBULATORY_CARE_PROVIDER_SITE_OTHER): Payer: Medicare Other | Admitting: Family Medicine

## 2020-04-30 ENCOUNTER — Other Ambulatory Visit: Payer: Self-pay

## 2020-04-30 VITALS — BP 98/61 | HR 74 | Temp 98.7°F | Resp 16 | Ht 71.0 in | Wt 204.0 lb

## 2020-04-30 DIAGNOSIS — R109 Unspecified abdominal pain: Secondary | ICD-10-CM | POA: Diagnosis not present

## 2020-04-30 DIAGNOSIS — E785 Hyperlipidemia, unspecified: Secondary | ICD-10-CM

## 2020-04-30 DIAGNOSIS — I10 Essential (primary) hypertension: Secondary | ICD-10-CM | POA: Diagnosis not present

## 2020-04-30 DIAGNOSIS — Z0000 Encounter for general adult medical examination without abnormal findings: Secondary | ICD-10-CM

## 2020-04-30 DIAGNOSIS — R1012 Left upper quadrant pain: Secondary | ICD-10-CM | POA: Diagnosis not present

## 2020-04-30 MED ORDER — HYDROCODONE-ACETAMINOPHEN 5-325 MG PO TABS: 1.0000 | ORAL_TABLET | Freq: Four times a day (QID) | ORAL | 0 refills | Status: DC | PRN

## 2020-04-30 NOTE — Patient Instructions (Addendum)
Stop Cardura.

## 2020-04-30 NOTE — Patient Instructions (Signed)
Mr. Dwayne Thompson , Thank you for taking time to come for your Medicare Wellness Visit. I appreciate your ongoing commitment to your health goals. Please review the following plan we discussed and let me know if I can assist you in the future.   Screening recommendations/referrals: Colonoscopy: Up to date, due 03/2023 Recommended yearly ophthalmology/optometry visit for glaucoma screening and checkup Recommended yearly dental visit for hygiene and checkup  Vaccinations: Influenza vaccine: Due fall 2021 Pneumococcal vaccine: Completed series Tdap vaccine: Currently due, declined today. Shingles vaccine: Shingrix discussed. Please contact your pharmacy for coverage information.     Advanced directives: Advance directive discussed with you today. I have provided a copy for you to complete at home and have notarized. Once this is complete please bring a copy in to our office so we can scan it into your chart.  Conditions/risks identified: Fall risk preventatives discussed today. Recommend to cut back on alcohol intake by half and only consuming 1-2 glasses a day instead of 3-4. Also recommend to continue to increase water intake.   Next appointment: 2:20 PM with Dr Rosanna Randy. Declined scheduling an AWV for 2022 at this time.   Preventive Care 48 Years and Older, Male Preventive care refers to lifestyle choices and visits with your health care provider that can promote health and wellness. What does preventive care include?  A yearly physical exam. This is also called an annual well check.  Dental exams once or twice a year.  Routine eye exams. Ask your health care provider how often you should have your eyes checked.  Personal lifestyle choices, including:  Daily care of your teeth and gums.  Regular physical activity.  Eating a healthy diet.  Avoiding tobacco and drug use.  Limiting alcohol use.  Practicing safe sex.  Taking low doses of aspirin every day.  Taking vitamin and mineral  supplements as recommended by your health care provider. What happens during an annual well check? The services and screenings done by your health care provider during your annual well check will depend on your age, overall health, lifestyle risk factors, and family history of disease. Counseling  Your health care provider may ask you questions about your:  Alcohol use.  Tobacco use.  Drug use.  Emotional well-being.  Home and relationship well-being.  Sexual activity.  Eating habits.  History of falls.  Memory and ability to understand (cognition).  Work and work Statistician. Screening  You may have the following tests or measurements:  Height, weight, and BMI.  Blood pressure.  Lipid and cholesterol levels. These may be checked every 5 years, or more frequently if you are over 32 years old.  Skin check.  Lung cancer screening. You may have this screening every year starting at age 73 if you have a 30-pack-year history of smoking and currently smoke or have quit within the past 15 years.  Fecal occult blood test (FOBT) of the stool. You may have this test every year starting at age 24.  Flexible sigmoidoscopy or colonoscopy. You may have a sigmoidoscopy every 5 years or a colonoscopy every 10 years starting at age 40.  Prostate cancer screening. Recommendations will vary depending on your family history and other risks.  Hepatitis C blood test.  Hepatitis B blood test.  Sexually transmitted disease (STD) testing.  Diabetes screening. This is done by checking your blood sugar (glucose) after you have not eaten for a while (fasting). You may have this done every 1-3 years.  Abdominal aortic aneurysm (AAA) screening.  You may need this if you are a current or former smoker.  Osteoporosis. You may be screened starting at age 64 if you are at high risk. Talk with your health care provider about your test results, treatment options, and if necessary, the need for more  tests. Vaccines  Your health care provider may recommend certain vaccines, such as:  Influenza vaccine. This is recommended every year.  Tetanus, diphtheria, and acellular pertussis (Tdap, Td) vaccine. You may need a Td booster every 10 years.  Zoster vaccine. You may need this after age 64.  Pneumococcal 13-valent conjugate (PCV13) vaccine. One dose is recommended after age 42.  Pneumococcal polysaccharide (PPSV23) vaccine. One dose is recommended after age 85. Talk to your health care provider about which screenings and vaccines you need and how often you need them. This information is not intended to replace advice given to you by your health care provider. Make sure you discuss any questions you have with your health care provider. Document Released: 10/02/2015 Document Revised: 05/25/2016 Document Reviewed: 07/07/2015 Elsevier Interactive Patient Education  2017 [employer] Prevention in the Home Falls can cause injuries. They can happen to people of all ages. There are many things you can do to make your home safe and to help prevent falls. What can I do on the outside of my home?  Regularly fix the edges of walkways and driveways and fix any cracks.  Remove anything that might make you trip as you walk through a door, such as a raised step or threshold.  Trim any bushes or trees on the path to your home.  Use bright outdoor lighting.  Clear any walking paths of anything that might make someone trip, such as rocks or tools.  Regularly check to see if handrails are loose or broken. Make sure that both sides of any steps have handrails.  Any raised decks and porches should have guardrails on the edges.  Have any leaves, snow, or ice cleared regularly.  Use sand or salt on walking paths during winter.  Clean up any spills in your garage right away. This includes oil or grease spills. What can I do in the bathroom?  Use night lights.  Install grab bars by the  toilet and in the tub and shower. Do not use towel bars as grab bars.  Use non-skid mats or decals in the tub or shower.  If you need to sit down in the shower, use a plastic, non-slip stool.  Keep the floor dry. Clean up any water that spills on the floor as soon as it happens.  Remove soap buildup in the tub or shower regularly.  Attach bath mats securely with double-sided non-slip rug tape.  Do not have throw rugs and other things on the floor that can make you trip. What can I do in the bedroom?  Use night lights.  Make sure that you have a light by your bed that is easy to reach.  Do not use any sheets or blankets that are too big for your bed. They should not hang down onto the floor.  Have a firm chair that has side arms. You can use this for support while you get dressed.  Do not have throw rugs and other things on the floor that can make you trip. What can I do in the kitchen?  Clean up any spills right away.  Avoid walking on wet floors.  Keep items that you use a lot in easy-to-reach places.  If you need to reach something above you, use a strong step stool that has a grab bar.  Keep electrical cords out of the way.  Do not use floor polish or wax that makes floors slippery. If you must use wax, use non-skid floor wax.  Do not have throw rugs and other things on the floor that can make you trip. What can I do with my stairs?  Do not leave any items on the stairs.  Make sure that there are handrails on both sides of the stairs and use them. Fix handrails that are broken or loose. Make sure that handrails are as long as the stairways.  Check any carpeting to make sure that it is firmly attached to the stairs. Fix any carpet that is loose or worn.  Avoid having throw rugs at the top or bottom of the stairs. If you do have throw rugs, attach them to the floor with carpet tape.  Make sure that you have a light switch at the top of the stairs and the bottom of  the stairs. If you do not have them, ask someone to add them for you. What else can I do to help prevent falls?  Wear shoes that:  Do not have high heels.  Have rubber bottoms.  Are comfortable and fit you well.  Are closed at the toe. Do not wear sandals.  If you use a stepladder:  Make sure that it is fully opened. Do not climb a closed stepladder.  Make sure that both sides of the stepladder are locked into place.  Ask someone to hold it for you, if possible.  Clearly mark and make sure that you can see:  Any grab bars or handrails.  First and last steps.  Where the edge of each step is.  Use tools that help you move around (mobility aids) if they are needed. These include:  Canes.  Walkers.  Scooters.  Crutches.  Turn on the lights when you go into a dark area. Replace any light bulbs as soon as they burn out.  Set up your furniture so you have a clear path. Avoid moving your furniture around.  If any of your floors are uneven, fix them.  If there are any pets around you, be aware of where they are.  Review your medicines with your doctor. Some medicines can make you feel dizzy. This can increase your chance of falling. Ask your doctor what other things that you can do to help prevent falls. This information is not intended to replace advice given to you by your health care provider. Make sure you discuss any questions you have with your health care provider. Document Released: 07/02/2009 Document Revised: 02/11/2016 Document Reviewed: 10/10/2014 Elsevier Interactive Patient Education  2017 [employer].

## 2020-05-02 LAB — CBC WITH DIFFERENTIAL/PLATELET
Basophils Absolute: 0 10*3/uL (ref 0.0–0.2)
Basos: 0 %
EOS (ABSOLUTE): 0.2 10*3/uL (ref 0.0–0.4)
Eos: 2 %
Hematocrit: 41.5 % (ref 37.5–51.0)
Hemoglobin: 14.3 g/dL (ref 13.0–17.7)
Immature Grans (Abs): 0 10*3/uL (ref 0.0–0.1)
Immature Granulocytes: 0 %
Lymphocytes Absolute: 1.6 10*3/uL (ref 0.7–3.1)
Lymphs: 24 %
MCH: 31.3 pg (ref 26.6–33.0)
MCHC: 34.5 g/dL (ref 31.5–35.7)
MCV: 91 fL (ref 79–97)
Monocytes Absolute: 0.7 10*3/uL (ref 0.1–0.9)
Monocytes: 10 %
Neutrophils Absolute: 4.3 10*3/uL (ref 1.4–7.0)
Neutrophils: 64 %
Platelets: 138 10*3/uL — ABNORMAL LOW (ref 150–450)
RBC: 4.57 x10E6/uL (ref 4.14–5.80)
RDW: 13 % (ref 11.6–15.4)
WBC: 6.7 10*3/uL (ref 3.4–10.8)

## 2020-05-02 LAB — COMPREHENSIVE METABOLIC PANEL
ALT: 11 IU/L (ref 0–44)
AST: 20 IU/L (ref 0–40)
Albumin/Globulin Ratio: 1.8 (ref 1.2–2.2)
Albumin: 4.3 g/dL (ref 3.7–4.7)
Alkaline Phosphatase: 58 IU/L (ref 48–121)
BUN/Creatinine Ratio: 7 — ABNORMAL LOW (ref 10–24)
BUN: 8 mg/dL (ref 8–27)
Bilirubin Total: 0.6 mg/dL (ref 0.0–1.2)
CO2: 23 mmol/L (ref 20–29)
Calcium: 9.4 mg/dL (ref 8.6–10.2)
Chloride: 105 mmol/L (ref 96–106)
Creatinine, Ser: 1.07 mg/dL (ref 0.76–1.27)
GFR calc Af Amer: 79 mL/min/{1.73_m2} (ref >59)
GFR calc non Af Amer: 68 mL/min/{1.73_m2} (ref >59)
Globulin, Total: 2.4 g/dL (ref 1.5–4.5)
Glucose: 93 mg/dL (ref 65–99)
Potassium: 3.9 mmol/L (ref 3.5–5.2)
Sodium: 142 mmol/L (ref 134–144)
Total Protein: 6.7 g/dL (ref 6.0–8.5)

## 2020-05-02 LAB — LIPID PANEL
Chol/HDL Ratio: 1.9 ratio (ref 0.0–5.0)
Cholesterol, Total: 100 mg/dL (ref 100–199)
HDL: 52 mg/dL (ref >39)
LDL Chol Calc (NIH): 37 mg/dL (ref 0–99)
Triglycerides: 43 mg/dL (ref 0–149)
VLDL Cholesterol Cal: 11 mg/dL (ref 5–40)

## 2020-05-05 ENCOUNTER — Other Ambulatory Visit: Payer: Self-pay

## 2020-05-05 ENCOUNTER — Ambulatory Visit: Payer: Medicare Other | Admitting: Physical Therapy

## 2020-05-05 DIAGNOSIS — M6208 Separation of muscle (nontraumatic), other site: Secondary | ICD-10-CM

## 2020-05-05 DIAGNOSIS — R2689 Other abnormalities of gait and mobility: Secondary | ICD-10-CM

## 2020-05-05 DIAGNOSIS — M6281 Muscle weakness (generalized): Secondary | ICD-10-CM

## 2020-05-05 NOTE — Patient Instructions (Signed)
PELVIC FLOOR / KEGEL EXERCISES   Pelvic floor/ Kegel exercises are used to strengthen the muscles in the base of your pelvis that are responsible for supporting your pelvic organs and preventing urine/feces leakage. Based on your therapist's recommendations, they can be performed while standing, sitting, or lying down.  Make yourself aware of this muscle group by using these cues:  Imagine you are in a crowded room and you feel the need to pass gas. Your response is to pull up and in at the rectum.  Close the rectum. Pull the muscles up inside your body,feeling your scrotum lifting as well . Feel the pelvic floor muscles lift as if you were walking into a cold lake.  Place your hand on top of your pubic bone. Tighten and draw in the muscles around the anal muscles without squeezing the buttock muscles.  Common Errors:  Breath holding: If you are holding your breath, you may be bearing down against your bladder instead of pulling it up. If you belly bulges up while you are squeezing, you are holding your breath. Be sure to breathe gently in and out while exercising. Counting out loud may help you avoid holding your breath.  Accessory muscle use: You should not see or feel other muscle movement when performing pelvic floor exercises. When done properly, no one can tell that you are performing the exercises. Keep the buttocks, belly and inner thighs relaxed.  Overdoing it: Your muscles can fatigue and stop working for you if you over-exercise. You may actually leak more or feel soreness at the lower abdomen or rectum.  YOUR HOME EXERCISE PROGRAM  LONG HOLDS: Position: on back  Inhale and then exhale. Then squeeze the muscle and count aloud for 3 seconds. Rest with three long breaths. (Be sure to let belly sink in with exhales and not push outward)  Perform 6 repetitions,  Morning, mid morning, mid afternoon, and night  ( 4 x)   different times/day  SHORT HOLDS: Position: on sitting    Inhale and then exhale. Then squeeze the muscle.  (Be sure to let belly sink in with exhales and not push outward)  Perform 5 repetitions, breakfast, lunch , dinner , in chair ( 5 x ) different day   **ALSO SQUEEZE BEFORE YOUR SNEEZE, COUGH, LAUGH to decrease downward pressure   **ALSO EXHALE BEFORE YOU RISE AGAINST GRAVITY (lifting, sit to stand, from squat to stand)

## 2020-05-05 NOTE — Therapy (Signed)
[place] MAIN [facility] SERVICES [address] [place], [place], [address] Phone: [phone]   Fax:  [phone]  Physical Therapy Treatment  Patient Details  Name: Dwayne Thompson MRN: [id number] Date of Birth: 11/07/1945 Referring Provider (PT): Boneta Lucks   Encounter Date: 05/05/2020   PT End of Session - 05/05/20 1504    Visit Number 8    Number of Visits 10    Date for PT Re-Evaluation 05/26/20   eval 03/17/20   PT Start Time 1100    PT Stop Time 1150    PT Time Calculation (min) 50 min    Activity Tolerance Patient tolerated treatment well    Behavior During Therapy [facility] for tasks assessed/performed           Past Medical History:  Diagnosis Date   Anemia    Back pain    BPH (benign prostatic hyperplasia)    Cirrhosis (HCC)    COPD (chronic obstructive pulmonary disease) ([place])    ED (erectile dysfunction)    GERD (gastroesophageal reflux disease)    Hyperlipidemia    Hypertension     Past Surgical History:  Procedure Laterality Date   COLONOSCOPY Left 01/03/2018   Procedure: COLONOSCOPY;  Surgeon: Virgel Manifold, MD;  Location: [facility] ENDOSCOPY;  Service: Endoscopy;  Laterality: Left;   COLONOSCOPY WITH PROPOFOL N/A 04/05/2018   Procedure: COLONOSCOPY WITH PROPOFOL;  Surgeon: Virgel Manifold, MD;  Location: [facility] ENDOSCOPY;  Service: Endoscopy;  Laterality: N/A;   CYSTOSCOPY WITH STENT PLACEMENT Right 09/26/2019   Procedure: CYSTOSCOPY WITH STENT PLACEMENT;  Surgeon: Royston Cowper, MD;  Location: [facility] ORS;  Service: Urology;  Laterality: Right;   ENTEROSCOPY  01/03/2018   Procedure: ENTEROSCOPY;  Surgeon: Virgel Manifold, MD;  Location: [place] ENDOSCOPY;  Service: Endoscopy;;   ESOPHAGOGASTRODUODENOSCOPY N/A 01/02/2018   Procedure: ESOPHAGOGASTRODUODENOSCOPY (EGD);  Surgeon: Virgel Manifold, MD;  Location: [facility] ENDOSCOPY;  Service: Endoscopy;  Laterality: N/A;   ESOPHAGOGASTRODUODENOSCOPY (EGD) WITH  PROPOFOL N/A 04/05/2018   Procedure: ESOPHAGOGASTRODUODENOSCOPY (EGD) WITH PROPOFOL;  Surgeon: Virgel Manifold, MD;  Location: [facility] ENDOSCOPY;  Service: Endoscopy;  Laterality: N/A;   exploration and neurolysis of right peroneal nerve at the knee Right 08/27/2018   GREEN LIGHT LASER TURP (TRANSURETHRAL RESECTION OF PROSTATE N/A 09/26/2019   Procedure: GREEN LIGHT LASER TURP (TRANSURETHRAL RESECTION OF PROSTATE;  Surgeon: Royston Cowper, MD;  Location: [facility] ORS;  Service: Urology;  Laterality: N/A;   HERNIA REPAIR     1980's    There were no vitals filed for this visit.   Subjective Assessment - 05/05/20 1108    Subjective Pt reports doing his pelvic exercises without the penile clamp while lying down. Pt has decreased trips to the bathroom at night ( 2-3 x / night instead of 3-4 x) .  Pt saw his PCP for his physical check up.  PT's PCP said he was not a candidate for sleep study. Pt will be getting a CTscan tomorrow for his acute L upper abdominal pain.    Patient is accompained by: Family member   wife   Pertinent History see Hx,    Patient Stated Goals to wear no more Depends and improve erections              [facility] PT Assessment - 05/05/20 1114      Palpation   Palpation comment stronger back, not slumped       Ambulation/Gait   Gait Comments less genu valgus, less shuffle  Pelvic Floor Special Questions - 05/05/20 1506    External Perineal Exam without clothing:  tight anterior mm L ( decreased post Tx ) more activation,      noted no urine drips on chunk pad   External Palpation seated quick holds, 6 reps              [facility] Adult PT Treatment/Exercise - 05/05/20 1114      Therapeutic Activites    Other Therapeutic Activities explained the difference beteen quick and endurance contractions       Neuro Re-ed    Neuro Re-ed Details  less cues for pelvic floor contractions                        PT Long Term Goals  - 04/14/20 1152      PT LONG TERM GOAL #1   Title Pt will report higher score on FOTO Urinary from 40 pts to > 45 pts in order to promote continence    Time 10    Period Weeks    Status On-going      PT LONG TERM GOAL #2   Title Pt will demo levelled iliac crest and shoulders and less spinal deviations in order to progress to deep core strengthening and coordination and pelvic floor strengthening    Time 4    Period Weeks    Status Achieved      PT LONG TERM GOAL #3   Title Pt will demo increased strength in R hip flexion, hip ext, hip abd to > 4/5 in order to improve pelvic girdle strength for walking and contience    Time 8    Period Weeks    Status On-going      PT LONG TERM GOAL #4   Title Pt will show decreased abdominal separation from 3 fingers width above umbilicus to < 2 fingers width in order to promote IAP for continence    Time 6    Period Weeks    Status On-going      PT LONG TERM GOAL #5   Title Pt will report having better control of urine when removing penile clamp in order to improve hygiene    Time 6    Period Weeks    Status Achieved      Additional Long Term Goals   Additional Long Term Goals Yes      PT LONG TERM GOAL #6   Title Pt will demo proper coordination and contraction of pelvic floor quick contrations 10 reps, 1 sec, and  endurance contractions 3 sec, 3 reps in order to minimize leakage    Time 8    Period Weeks    Status On-going      PT LONG TERM GOAL #7   Title Pt will be referred for a sleep study to rule in or out OSA due to nocturia issues    Time 4    Period Weeks    Status On-going      PT LONG TERM GOAL #8   Title Pt will demo increased gait speed from 0.65 m/s to > 1.0 m/s to ambulate in the community safely with less risk for falls    Time 8    Period Weeks    Status On-going      PT LONG TERM GOAL  #9   TITLE Pt will increased distance from 456 ft in 6 MWT to > 800 ft in order to improve ambulation safely  in community and  minimize fear of falling    Time 6    Period Weeks    Status New    Target Date 05/26/20                 Plan - 05/05/20 1112    Clinical Impression Statement Nocturia is improving.  Pt reports doing his pelvic exercises without the penile clamp while lying down. Pt has decreased trips to the bathroom at night ( 2-3 x / night instead of 3-4 x) .   Pt showed less cues for pelvic floor contractions with no more abdominal mm overuse and therefore, progressed pt to quick contraction in seated position and endurance training in hooklying position. Noted no urine droplets today on chuck pad compared to last session. Encouraged pt to continue with pelvic floor contractions.   Pt continues to show improvement BLE and spinal strength, less genu valgus in gait and he remains compliant with HEP.   Pt continues to benefit from skilled PT   Examination-Activity Limitations Continence    Stability/Clinical Decision Making Evolving/Moderate complexity    Rehab Potential Good    PT Frequency 1x / week    PT Duration Other (comment) -  10   PT Treatment/Interventions ADLs/Self Care Home Management;Moist Heat;Gait training;Therapeutic activities;Therapeutic exercise;Balance training;Neuromuscular re-education;Patient/family education;Manual techniques;Passive range of motion;Manual lymph drainage    Consulted and Agree with Plan of Care Patient;Family member/caregiver    Family Member Consulted wife           Patient will benefit from skilled therapeutic intervention in order to improve the following deficits and impairments:  Pain, Decreased range of motion, Decreased strength, Impaired perceived functional ability, Decreased mobility, Decreased activity tolerance, Decreased balance, Hypomobility, Decreased endurance, Decreased coordination, Abnormal gait, Difficulty walking, Postural dysfunction, Improper body mechanics  Visit Diagnosis: No diagnosis found.     Problem List Patient  Active Problem List   Diagnosis Date Noted   Left flank pain 04/23/2020   Essential hypertension 03/12/2019   Thoracic aortic aneurysm ([place]) 03/12/2019   Abnormal chest CT 02/13/2019   Difficulty walking 12/06/2018   Muscle weakness 12/06/2018   Cellulitis of lower leg 09/17/2018   CLE (columnar lined esophagus)    Special screening for malignant neoplasms, colon    Benign neoplasm of ascending colon    Polyp of sigmoid colon    Diverticulosis of large intestine without diverticulitis    Iron deficiency anemia due to chronic blood loss 02/18/2018   Folate deficiency 02/14/2018   B12 deficiency 45/80/9983   Folic acid deficiency 38/25/0539   AVM (arteriovenous malformation) of small bowel, acquired 76/73/4193   Helicobacter pylori gastritis (chronic gastritis) 01/10/2018   Thrombocytopenia ([place]) 01/10/2018   Internal hemorrhoids    Acute gastrointestinal hemorrhage    Stomach irritation    Duodenal nodule    Melena 01/01/2018   Neck pain on right side 03/23/2016   History of tobacco abuse 03/23/2016   Cervical lymphadenitis 03/23/2016   BPH with obstruction/lower urinary tract symptoms 12/24/2015   Erectile dysfunction of organic origin 12/24/2015   Degenerative arthritis of lumbar spine 04/29/2015   Allergic rhinitis 03/28/2015   Benign fibroma of prostate 03/28/2015   Failure of erection 03/28/2015   Acid reflux 03/28/2015   Arthritis, degenerative 03/28/2015   Change in blood platelet count 03/28/2015   Temporary cerebral vascular dysfunction 03/28/2015   Personal history of tobacco use, presenting hazards to health 03/28/2015    Jerl Mina ,PT, DPT, E-RYT  05/05/2020, 3:14 PM  [place] MAIN [facility] SERVICES [address] [place], [place], [address] Phone: [phone]   Fax:  [phone]  Name: Dwayne Thompson MRN: [id number] Date of Birth: 03/08/1946

## 2020-05-06 ENCOUNTER — Ambulatory Visit
Admission: RE | Admit: 2020-05-06 | Discharge: 2020-05-06 | Disposition: A | Discharge status: Home / Self Care | Payer: Medicare Other | Source: Ambulatory Visit | Attending: Physician Assistant | Admitting: Physician Assistant

## 2020-05-06 ENCOUNTER — Other Ambulatory Visit: Payer: Self-pay

## 2020-05-06 DIAGNOSIS — R1012 Left upper quadrant pain: Secondary | ICD-10-CM | POA: Diagnosis present

## 2020-05-07 ENCOUNTER — Other Ambulatory Visit: Payer: Self-pay

## 2020-05-07 DIAGNOSIS — J9811 Atelectasis: Secondary | ICD-10-CM

## 2020-05-07 DIAGNOSIS — J90 Pleural effusion, not elsewhere classified: Secondary | ICD-10-CM

## 2020-05-07 NOTE — Progress Notes (Signed)
Chest Ct Scan ordered for patient.

## 2020-05-12 ENCOUNTER — Ambulatory Visit: Payer: Medicare Other | Admitting: Physical Therapy

## 2020-05-12 ENCOUNTER — Other Ambulatory Visit: Payer: Self-pay | Admitting: Family Medicine

## 2020-05-12 DIAGNOSIS — J3089 Other allergic rhinitis: Secondary | ICD-10-CM

## 2020-05-12 NOTE — Progress Notes (Signed)
Established patient visit   Patient: Dwayne Thompson   DOB: 25-Apr-1946   74 y.o. Male  MRN: [id number] Visit Date: 05/14/2020  Today's healthcare provider: [name], MD   Chief Complaint  Patient presents with  . Follow-up   Subjective    HPI  Follow up-Left flank pain At last office visit 04/30/20 patient was started on Hydrocodone-Acetaminophen 5-325mg  and labs were ordered. Lab results from 05/01/20 showed that patients labs were stable. Today patient reports that flank pain is still present and describes it as a throbbing feeling, patient reports that when he lays down on his side he has a hard time breathing, patient also reports pain when sitting and extending his upper body.  Pain is more pleuritic now in the left lower lateral chest wall.  He gets short of breath when he is supine.    Of note is the patient has chronic constipation and states of personal adult life he has had about 1 bowel movement per week.  He is worried the pain pill for his pleurisy/pleuritic pain will make his constipation worse.  He is also worried about addiction.  He has taken half a pain pill so far.  From 04/30/2020-CT Renal Stone Study obtained 05/07/2020.  Patient is on pantoprazole.  IMPRESSION:CT Report 1. No acute abdominal/pelvic findings, mass lesions or adenopathy. 2. No renal, ureteral or bladder calculi or mass. 3. Moderate-sized left pleural effusion with overlying atelectasis or infiltrate. 4. Nodularity noted at the left lung base not for sure seen on prior chest CT. I would recommend a follow-up full chest CT for further evaluation. 5. Stable hepatic and left renal cysts. 6. Emphysema and aortic atherosclerosis.  Patient Active Problem List   Diagnosis Date Noted  . Left flank pain 04/23/2020  . Essential hypertension 03/12/2019  . Thoracic aortic aneurysm ([place]) 03/12/2019  . Abnormal chest CT 02/13/2019  . Difficulty walking 12/06/2018  . Muscle weakness 12/06/2018  .  Cellulitis of lower leg 09/17/2018  . CLE (columnar lined esophagus)   . Special screening for malignant neoplasms, colon   . Benign neoplasm of ascending colon   . Polyp of sigmoid colon   . Diverticulosis of large intestine without diverticulitis   . Iron deficiency anemia due to chronic blood loss 02/18/2018  . Folate deficiency 02/14/2018  . B12 deficiency 01/10/2018  . Folic acid deficiency 71/02/2693  . 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 Helicobacter pylori gastritis (chronic gastritis) 01/10/2018  . Thrombocytopenia ([place]) 01/10/2018  . Internal hemorrhoids   . Acute gastrointestinal hemorrhage   . Stomach irritation   . Duodenal nodule   . Melena 01/01/2018  . Neck pain on right side 03/23/2016  . History of tobacco abuse 03/23/2016  . Cervical lymphadenitis 03/23/2016  . 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 Allergic rhinitis 03/28/2015  . 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 Failure of erection 03/28/2015  . Acid reflux 03/28/2015  . Arthritis, degenerative 03/28/2015  . Change in blood platelet count 03/28/2015  . Temporary cerebral vascular dysfunction 03/28/2015  . Personal history of tobacco use, presenting hazards to health 03/28/2015   Past Medical History:  Diagnosis Date  . Anemia   . Back pain   . BPH (benign prostatic hyperplasia)   . Cirrhosis ([place])   . COPD (chronic obstructive pulmonary disease) ([place])   . ED (erectile  dysfunction)   . GERD (gastroesophageal reflux disease)   . Hyperlipidemia   . Hypertension    Social History   Tobacco Use  . Smoking status: Former Smoker    Packs/day: 1.00    Years: 50.00    Pack years: 50.00    Types: Cigarettes    Quit date: 10/20/2013    Years since quitting: 6.5  . Smokeless tobacco: Never Used  Vaping Use  . Vaping Use: Never used    Substance Use Topics  . Alcohol use: Yes    Alcohol/week: 21.0 - 28.0 standard drinks    Types: 21 - 28 Shots of liquor per week    Comment: 3-4 gin and sodas a day  . Drug use: No   Allergies  Allergen Reactions  . Tamsulosin Hcl Hives  . Sulfa Antibiotics Rash       Medications: Outpatient Medications Prior to Visit  Medication Sig  . acetaminophen (TYLENOL) 325 MG tablet Take 2 tablets (650 mg total) by mouth 3 (three) times daily. (Patient taking differently: Take 650 mg by mouth every 6 (six) hours as needed for moderate pain. )  . cetirizine (ZYRTEC) 10 MG tablet Take 1 tablet (10 mg total) by mouth daily.  . cholecalciferol (VITAMIN D3) 25 MCG (1000 UNIT) tablet Take 1,000 Units by mouth daily.  . cyclobenzaprine (FLEXERIL) 10 MG tablet TAKE ONE TABLET BY MOUTH TWICE A DAY AS NEEDED AND TAKE ONE TABLET BY MOUTH EVERY EVENING  . docusate sodium (COLACE) 100 MG capsule Take 2 capsules (200 mg total) by mouth 2 (two) times daily.  Marland Kitchen dutasteride (AVODART) 0.5 MG capsule Take 0.5 mg by mouth daily.  . folic acid (FOLVITE) 1 MG tablet Take 1 tablet (1 mg total) by mouth daily.  Marland Kitchen HYDROcodone-acetaminophen (NORCO/VICODIN) 5-325 MG tablet Take 1 tablet by mouth every 6 (six) hours as needed for moderate pain (x 5 days). Prescirbed by Carles Collet  . lisinopril (ZESTRIL) 10 MG tablet Take 1 tablet (10 mg total) by mouth daily.  . montelukast (SINGULAIR) 10 MG tablet TAKE 1 TABLET BY MOUTH EVERY DAY  . oxyCODONE-acetaminophen (PERCOCET) 5-325 MG tablet Take 1 tablet by mouth every 4 (four) hours as needed for moderate pain or severe pain.  . pantoprazole (PROTONIX) 20 MG tablet TAKE ONE TABLET BY MOUTH TWICE A DAY  . potassium chloride (K-DUR,KLOR-CON) 10 MEQ tablet Take 1 tablet (10 mEq total) by mouth 2 (two) times daily.  . rosuvastatin (CRESTOR) 10 MG tablet Take 1 tablet (10 mg total) by mouth daily.  . vitamin B-12 1000 MCG tablet Take 1 tablet (1,000 mcg total) by mouth daily.   . [DISCONTINUED] ciprofloxacin (CIPRO) 500 MG tablet Take 1 tablet (500 mg total) by mouth 2 (two) times daily.  . [DISCONTINUED] ciprofloxacin (CIPRO) 500 MG tablet Take 1 tablet (500 mg total) by mouth 2 (two) times daily.  . AMBULATORY NON FORMULARY MEDICATION Trimix (30 mg PAPA, 1 mL phen, 50 mg prostaglandin)   Vial 18ml   Dose 0.27mcg  Qty 10 #6 refills  [facility] [phone] Fax [phone] (Patient not taking: Reported on 05/14/2020)  . doxazosin (CARDURA) 2 MG tablet Take 1 tablet (2 mg total) by mouth daily. (Patient not taking: Reported on 05/14/2020)  . tadalafil (ADCIRCA/CIALIS) 20 MG tablet Take 1 tablet (20 mg total) by mouth daily as needed for erectile dysfunction. (Patient not taking: Reported on 05/14/2020)  . valACYclovir (VALTREX) 1000 MG tablet Take 1 tablet (1,000 mg total) by mouth 3 (three)  times daily. (Patient not taking: Reported on 05/14/2020)   No facility-administered medications prior to visit.    Review of Systems  Constitutional: Negative for appetite change, chills and fever.  Respiratory: Negative for chest tightness, shortness of breath and wheezing.   Cardiovascular: Negative for chest pain and palpitations.  Gastrointestinal: Negative for abdominal pain, nausea and vomiting.       Objective    BP 111/73   Pulse 88   Temp 98.3 F (36.8 C) (Oral)   Resp 16   Wt 203 lb 9.6 oz (92.4 kg)   SpO2 96%   BMI 28.40 kg/m  BP Readings from Last 3 Encounters:  05/14/20 111/73  04/30/20 98/61  04/23/20 115/75   Wt Readings from Last 3 Encounters:  05/14/20 203 lb 9.6 oz (92.4 kg)  04/30/20 204 lb (92.5 kg)  04/23/20 205 lb 9.6 oz (93.3 kg)      Physical Exam    No results found for any visits on 05/14/20.  Assessment & Plan     1. Pleurisy with effusion CT tomorrow.  I think he will need pleurocentesis for comfort.  Will await results of CT to determine plan.  2. Chest pain, unspecified type From effusion  3. Pneumonia  due to infectious organism, unspecified laterality, unspecified part of lung Treated with doxycycline. - doxycycline (VIBRA-TABS) 100 MG tablet; Take 1 tablet (100 mg total) by mouth 2 (two) times daily.  Dispense: 10 tablet; Refill: 0 - amoxicillin-clavulanate (AUGMENTIN) 875-125 MG tablet; Take 1 tablet by mouth 2 (two) times daily.  Dispense: 20 tablet; Refill: 0  4. Pleurisy Take pain medicine at nighttime to help him sleep.  5. Thoracic aortic aneurysm without rupture ([place])   6. Alcoholic cirrhosis of liver without ascites ([place]) Patient knows quit drinking. 7.Chronic Constipation Try GlycoLax daily  No follow-ups on file.      I, [name], MD, have reviewed all documentation for this visit. The documentation on 05/17/20 for the exam, diagnosis, procedures, and orders are all accurate and complete.    Devone Bonilla Cranford Mon, MD  [facility] [phone] (phone) [phone] (fax)  [place]

## 2020-05-12 NOTE — Telephone Encounter (Signed)
Requested Prescriptions  Pending Prescriptions Disp Refills  . montelukast (SINGULAIR) 10 MG tablet [Pharmacy Med Name: MONTELUKAST SOD 10 MG TABLET] 90 tablet 1    Sig: TAKE 1 TABLET BY MOUTH EVERY DAY     Pulmonology:  Leukotriene Inhibitors Passed - 05/12/2020  1:20 AM      Passed - Valid encounter within last 12 months    Recent Outpatient Visits          1 week ago Annual physical exam   [facility] Jerrol Banana., MD   2 weeks ago Left flank pain   [facility] Carles Collet [place], [place]   3 months ago Essential hypertension   [facility] Jerrol Banana., MD   7 months ago Need for immunization against viral hepatitis   [facility] Birdie Sons, MD   9 months ago Hepatic cirrhosis, unspecified hepatic cirrhosis type, unspecified whether ascites present [facility])   [facility] Jerrol Banana., MD      Future Appointments            In 2 days Jerrol Banana., MD [facility], [place]

## 2020-05-14 ENCOUNTER — Encounter: Payer: Self-pay | Admitting: Family Medicine

## 2020-05-14 ENCOUNTER — Ambulatory Visit (INDEPENDENT_AMBULATORY_CARE_PROVIDER_SITE_OTHER): Payer: Medicare Other | Admitting: Family Medicine

## 2020-05-14 ENCOUNTER — Other Ambulatory Visit: Payer: Self-pay

## 2020-05-14 VITALS — BP 111/73 | HR 88 | Temp 98.3°F | Resp 16 | Wt 203.6 lb

## 2020-05-14 DIAGNOSIS — K7030 Alcoholic cirrhosis of liver without ascites: Secondary | ICD-10-CM

## 2020-05-14 DIAGNOSIS — J90 Pleural effusion, not elsewhere classified: Secondary | ICD-10-CM | POA: Diagnosis not present

## 2020-05-14 DIAGNOSIS — R079 Chest pain, unspecified: Secondary | ICD-10-CM

## 2020-05-14 DIAGNOSIS — R091 Pleurisy: Secondary | ICD-10-CM | POA: Diagnosis not present

## 2020-05-14 DIAGNOSIS — K5909 Other constipation: Secondary | ICD-10-CM

## 2020-05-14 DIAGNOSIS — I712 Thoracic aortic aneurysm, without rupture, unspecified: Secondary | ICD-10-CM

## 2020-05-14 DIAGNOSIS — J189 Pneumonia, unspecified organism: Secondary | ICD-10-CM | POA: Diagnosis not present

## 2020-05-14 MED ORDER — DOXYCYCLINE HYCLATE 100 MG PO TABS: 100.0000 mg | ORAL_TABLET | Freq: Two times a day (BID) | ORAL | 0 refills | Status: DC

## 2020-05-14 NOTE — Patient Instructions (Addendum)
Stop taking Pantoprazole, Start taking Glycolax daily.   Polyethylene Glycol powder What is this medicine? POLYETHYLENE GLYCOL 3350 (pol ee ETH i leen; GLYE col) powder is a laxative used to treat constipation. It increases the amount of water in the stool. Bowel movements become easier and more frequent. This medicine may be used for other purposes; ask your health care provider or pharmacist if you have questions. COMMON BRAND NAME(S): Sharlyn Bologna, GlycoLax, Healthylax, MiraLax, Smooth LAX, [facility] What should I tell my health care provider before I take this medicine? They need to know if you have any of these conditions:  a history of blockage of the stomach or intestine  current abdomen distension or pain  difficulty swallowing  diverticulitis, ulcerative colitis, or other chronic bowel disease  phenylketonuria  an unusual or allergic reaction to polyethylene glycol, other medicines, dyes, or preservatives  pregnant or trying to get pregnant  breast-feeding How should I use this medicine? Take this medicine by mouth. The bottle has a measuring cap that is marked with a line. Pour the powder into the cap up to the marked line (the dose is about 1 heaping tablespoon). Add the powder in the cap to a full glass (4 to 8 ounces or 120 to 240 mL) of water, juice, soda, coffee or tea. Mix the powder well. Ensure that the powder is fully dissolved. Do not drink if there are any clumps. Drink the solution. Take exactly as directed. Do not take your medicine more often than directed. Talk to your pediatrician regarding the use of this medicine in children. Special care may be needed. Overdosage: If you think you have taken too much of this medicine contact a poison control center or emergency room at once. NOTE: This medicine is only for you. Do not share this medicine with others. What if I miss a dose? If you miss a dose, take it as soon as you can. If it is almost time for your  next dose, take only that dose. Do not take double or extra doses. What may interact with this medicine? Interactions are not expected. This list may not describe all possible interactions. Give your health care provider a list of all the medicines, herbs, non-prescription drugs, or dietary supplements you use. Also tell them if you smoke, drink alcohol, or use illegal drugs. Some items may interact with your medicine. What should I watch for while using this medicine? Do not use for more than 2 weeks without advice from your doctor or health care professional. It can take 2 to 4 days to have a bowel movement and to experience improvement in constipation. See your health care professional for any changes in bowel habits, including constipation, that are severe or last longer than three weeks. Always take this medicine with plenty of water. What side effects may I notice from receiving this medicine? Side effects that you should report to your doctor or health care professional as soon as possible:  diarrhea  difficulty breathing  itching of the skin, hives, or skin rash  severe bloating, pain, or distension of the stomach  vomiting Side effects that usually do not require medical attention (report to your doctor or health care professional if they continue or are bothersome):  bloating or gas  lower abdominal discomfort or cramps  nausea This list may not describe all possible side effects. Call your doctor for medical advice about side effects. You may report side effects to FDA at 1-800-FDA-1088. Where should I keep  my medicine? Keep out of the reach of children. Store between 15 and 30 degrees C (59 and 86 degrees F). Throw away any unused medicine after the expiration date. NOTE: This sheet is a summary. It may not cover all possible information. If you have questions about this medicine, talk to your doctor, pharmacist, or health care provider.  2020 Elsevier/Gold Standard  (2018-02-22 10:42:01)

## 2020-05-15 ENCOUNTER — Other Ambulatory Visit: Payer: Self-pay

## 2020-05-15 ENCOUNTER — Ambulatory Visit
Admission: RE | Admit: 2020-05-15 | Discharge: 2020-05-15 | Disposition: A | Discharge status: Home / Self Care | Payer: Medicare Other | Source: Ambulatory Visit | Attending: Family Medicine | Admitting: Family Medicine

## 2020-05-15 DIAGNOSIS — J9811 Atelectasis: Secondary | ICD-10-CM | POA: Diagnosis present

## 2020-05-15 DIAGNOSIS — J90 Pleural effusion, not elsewhere classified: Secondary | ICD-10-CM | POA: Diagnosis present

## 2020-05-16 MED ORDER — AMOXICILLIN-POT CLAVULANATE 875-125 MG PO TABS: 1.0000 | ORAL_TABLET | Freq: Two times a day (BID) | ORAL | 0 refills | Status: DC

## 2020-05-18 ENCOUNTER — Other Ambulatory Visit: Payer: Self-pay | Admitting: Family Medicine

## 2020-05-18 ENCOUNTER — Telehealth: Payer: Self-pay

## 2020-05-18 DIAGNOSIS — J189 Pneumonia, unspecified organism: Secondary | ICD-10-CM

## 2020-05-18 DIAGNOSIS — J90 Pleural effusion, not elsewhere classified: Secondary | ICD-10-CM

## 2020-05-18 NOTE — Telephone Encounter (Signed)
-----   Message from Jerrol Banana., MD sent at 05/16/2020 12:11 PM EDT ----- Patient advised of this over the phone on Saturday.  He is being treated for pneumonia.  We will refer to pulmonary, Dr. Patsey Berthold has  seen him in the past.

## 2020-05-18 NOTE — Telephone Encounter (Signed)
Patient advised of labs by Dr. Rosanna Randy and referral placed to Dr. Duwayne Heck at [facility].

## 2020-05-19 ENCOUNTER — Encounter: Payer: Self-pay | Admitting: Family Medicine

## 2020-05-19 ENCOUNTER — Ambulatory Visit: Payer: Medicare Other | Admitting: Physical Therapy

## 2020-05-19 NOTE — Addendum Note (Signed)
Addended by: Eulas Post on: 05/19/2020 12:29 PM   Modules accepted: Orders

## 2020-05-20 ENCOUNTER — Other Ambulatory Visit: Payer: Self-pay | Admitting: Family Medicine

## 2020-05-20 ENCOUNTER — Other Ambulatory Visit: Payer: Self-pay

## 2020-05-20 ENCOUNTER — Other Ambulatory Visit
Admission: RE | Admit: 2020-05-20 | Discharge: 2020-05-20 | Disposition: A | Discharge status: Home / Self Care | Payer: Medicare Other | Source: Ambulatory Visit | Attending: Family Medicine | Admitting: Family Medicine

## 2020-05-20 ENCOUNTER — Ambulatory Visit
Admission: RE | Admit: 2020-05-20 | Discharge: 2020-05-20 | Disposition: A | Discharge status: Home / Self Care | Payer: Medicare Other | Source: Ambulatory Visit | Attending: Interventional Radiology | Admitting: Interventional Radiology

## 2020-05-20 ENCOUNTER — Other Ambulatory Visit
Admission: RE | Admit: 2020-05-20 | Discharge: 2020-05-20 | Disposition: A | Discharge status: Home / Self Care | Payer: Medicare Other | Source: Home / Self Care | Attending: Hematology and Oncology | Admitting: Hematology and Oncology

## 2020-05-20 ENCOUNTER — Ambulatory Visit
Admission: RE | Admit: 2020-05-20 | Discharge: 2020-05-20 | Disposition: A | Discharge status: Home / Self Care | Payer: Medicare Other | Source: Ambulatory Visit | Attending: Family Medicine | Admitting: Family Medicine

## 2020-05-20 ENCOUNTER — Other Ambulatory Visit: Payer: Self-pay | Admitting: Interventional Radiology

## 2020-05-20 DIAGNOSIS — D500 Iron deficiency anemia secondary to blood loss (chronic): Secondary | ICD-10-CM

## 2020-05-20 DIAGNOSIS — J90 Pleural effusion, not elsewhere classified: Secondary | ICD-10-CM | POA: Insufficient documentation

## 2020-05-20 DIAGNOSIS — J189 Pneumonia, unspecified organism: Secondary | ICD-10-CM | POA: Diagnosis not present

## 2020-05-20 DIAGNOSIS — E538 Deficiency of other specified B group vitamins: Secondary | ICD-10-CM

## 2020-05-20 DIAGNOSIS — Z9889 Other specified postprocedural states: Secondary | ICD-10-CM | POA: Diagnosis not present

## 2020-05-20 DIAGNOSIS — Z20822 Contact with and (suspected) exposure to covid-19: Secondary | ICD-10-CM | POA: Insufficient documentation

## 2020-05-20 LAB — CBC WITH DIFFERENTIAL/PLATELET
Abs Immature Granulocytes: 0.04 10*3/uL (ref 0.00–0.07)
Basophils Absolute: 0 10*3/uL (ref 0.0–0.1)
Basophils Relative: 0 %
Eosinophils Absolute: 0.1 10*3/uL (ref 0.0–0.5)
Eosinophils Relative: 1 %
HCT: 43.7 % (ref 39.0–52.0)
Hemoglobin: 15.5 g/dL (ref 13.0–17.0)
Immature Granulocytes: 1 %
Lymphocytes Relative: 16 %
Lymphs Abs: 1.4 10*3/uL (ref 0.7–4.0)
MCH: 32 pg (ref 26.0–34.0)
MCHC: 35.5 g/dL (ref 30.0–36.0)
MCV: 90.3 fL (ref 80.0–100.0)
Monocytes Absolute: 0.9 10*3/uL (ref 0.1–1.0)
Monocytes Relative: 10 %
Neutro Abs: 6.3 10*3/uL (ref 1.7–7.7)
Neutrophils Relative %: 72 %
Platelets: 152 10*3/uL (ref 150–400)
RBC: 4.84 MIL/uL (ref 4.22–5.81)
RDW: 12.6 % (ref 11.5–15.5)
WBC: 8.8 10*3/uL (ref 4.0–10.5)
nRBC: 0 % (ref 0.0–0.2)

## 2020-05-20 LAB — COMPREHENSIVE METABOLIC PANEL
ALT: 19 U/L (ref 0–44)
AST: 24 U/L (ref 15–41)
Albumin: 4.2 g/dL (ref 3.5–5.0)
Alkaline Phosphatase: 53 U/L (ref 38–126)
Anion gap: 12 (ref 5–15)
BUN: 12 mg/dL (ref 8–23)
CO2: 23 mmol/L (ref 22–32)
Calcium: 9.6 mg/dL (ref 8.9–10.3)
Chloride: 98 mmol/L (ref 98–111)
Creatinine, Ser: 0.85 mg/dL (ref 0.61–1.24)
GFR calc Af Amer: >60 mL/min (ref >60)
GFR calc non Af Amer: >60 mL/min (ref >60)
Glucose, Bld: 93 mg/dL (ref 70–99)
Potassium: 4.2 mmol/L (ref 3.5–5.1)
Sodium: 133 mmol/L — ABNORMAL LOW (ref 135–145)
Total Bilirubin: 1.1 mg/dL (ref 0.3–1.2)
Total Protein: 7.8 g/dL (ref 6.5–8.1)

## 2020-05-20 LAB — IRON AND TIBC
Iron: 41 ug/dL — ABNORMAL LOW (ref 45–182)
Saturation Ratios: 13 % — ABNORMAL LOW (ref 17.9–39.5)
TIBC: 328 ug/dL (ref 250–450)
UIBC: 287 ug/dL

## 2020-05-20 LAB — SARS CORONAVIRUS 2 BY RT PCR (HOSPITAL ORDER, PERFORMED IN ~~LOC~~ HOSPITAL LAB): SARS Coronavirus 2: NEGATIVE

## 2020-05-20 LAB — FERRITIN: Ferritin: 112 ng/mL (ref 24–336)

## 2020-05-21 ENCOUNTER — Inpatient Hospital Stay: Payer: Medicare Other | Attending: Hematology and Oncology

## 2020-05-22 LAB — CYTOLOGY - NON PAP

## 2020-05-23 LAB — BODY FLUID CULTURE: Culture: NO GROWTH

## 2020-05-26 ENCOUNTER — Encounter: Payer: Self-pay | Admitting: Physical Therapy

## 2020-05-26 ENCOUNTER — Telehealth: Payer: Self-pay | Admitting: *Deleted

## 2020-05-26 ENCOUNTER — Ambulatory Visit: Payer: Medicare Other | Admitting: Physical Therapy

## 2020-05-26 DIAGNOSIS — R2689 Other abnormalities of gait and mobility: Secondary | ICD-10-CM

## 2020-05-26 DIAGNOSIS — M6281 Muscle weakness (generalized): Secondary | ICD-10-CM

## 2020-05-26 DIAGNOSIS — M6208 Separation of muscle (nontraumatic), other site: Secondary | ICD-10-CM

## 2020-05-26 NOTE — Telephone Encounter (Signed)
Appt scheduled tomorrow @ 3:20pm.

## 2020-05-26 NOTE — Telephone Encounter (Signed)
Copied from [place] [id number]. Topic: General - Inquiry >> May 26, 2020  9:26 AM Dwayne Thompson D wrote: Reason for CRM: Pt's wife Dwayne Thompson called saying her husband had a procedure last week that Dr. Rosanna Randy ordered.  She said there was no follow up appt and she wants to know whether he needs to FU with Dr. Rosanna Randy or Dr. Duwayne Heck.   She said he can't lay down because it is to painful.  He had fluid drained from his lungs.    CB#  [id number]

## 2020-05-26 NOTE — Telephone Encounter (Signed)
I can see him tomorrow at 3.

## 2020-05-26 NOTE — Therapy (Signed)
[place] MAIN [facility] SERVICES [address] [place], [place], [address] Phone: [phone]   Fax:  [phone]  Patient Details  Name: Dwayne Thompson MRN: [id number] Date of Birth: July 24, 1946 Referring Provider:   Encounter Date: 05/26/2020   Pt's physical therapy is on hold.   Physical therapist called patient to f/u on his medical work up and advising pt to withhold on physical sessions in the month of Sept.  Educated pt and wife to maintaining communication with PCP and his referral to pulmonologist for his pneumonia, flank pain, and weakness in legs. Provided the number for pulmonologist for wife to call and f/u on appt.    Advised pt to maintain his pelvic PT exercises, breathing exercises which will also help with his lungs, and to hold off on standing exercises if he is feeling weak. Advised for pt to use cane for balance and minimize risk for falls.   Holding appt in October for pt. Plan to check up on pt's medical conditions.     Jerl Mina ,PT, DPT, E-RYT   05/26/2020, 8:50 AM  [place] MAIN [facility] SERVICES [address] [place], [place], [address] Phone: [phone]   Fax:  [phone]

## 2020-05-27 ENCOUNTER — Encounter: Payer: Self-pay | Admitting: Family Medicine

## 2020-05-27 ENCOUNTER — Ambulatory Visit (INDEPENDENT_AMBULATORY_CARE_PROVIDER_SITE_OTHER): Payer: Medicare Other | Admitting: Family Medicine

## 2020-05-27 ENCOUNTER — Telehealth: Payer: Self-pay | Admitting: Pulmonary Disease

## 2020-05-27 ENCOUNTER — Ambulatory Visit
Admission: RE | Admit: 2020-05-27 | Discharge: 2020-05-27 | Disposition: A | Discharge status: Home / Self Care | Payer: Medicare Other | Attending: Family Medicine | Admitting: Family Medicine

## 2020-05-27 ENCOUNTER — Ambulatory Visit
Admission: RE | Admit: 2020-05-27 | Discharge: 2020-05-27 | Disposition: A | Discharge status: Home / Self Care | Payer: Medicare Other | Source: Ambulatory Visit | Attending: Family Medicine | Admitting: Family Medicine

## 2020-05-27 ENCOUNTER — Other Ambulatory Visit: Payer: Self-pay

## 2020-05-27 VITALS — BP 103/65 | HR 81 | Temp 98.4°F | Ht 71.0 in | Wt 196.2 lb

## 2020-05-27 DIAGNOSIS — J90 Pleural effusion, not elsewhere classified: Secondary | ICD-10-CM | POA: Insufficient documentation

## 2020-05-27 DIAGNOSIS — J189 Pneumonia, unspecified organism: Secondary | ICD-10-CM

## 2020-05-27 DIAGNOSIS — K7030 Alcoholic cirrhosis of liver without ascites: Secondary | ICD-10-CM

## 2020-05-27 DIAGNOSIS — K5909 Other constipation: Secondary | ICD-10-CM | POA: Diagnosis not present

## 2020-05-27 MED ORDER — PREDNISONE 10 MG PO TABS: 10.0000 mg | ORAL_TABLET | Freq: Every day | ORAL | 0 refills | Status: DC

## 2020-05-27 NOTE — Patient Instructions (Signed)
RESTART LINZESS FOR CONSTIPATION !!

## 2020-05-27 NOTE — Telephone Encounter (Signed)
ATC, left vm to return call in the morning.

## 2020-05-27 NOTE — Progress Notes (Signed)
Established patient visit   Patient: Dwayne Thompson   DOB: September 21, 1945   74 y.o. Male  MRN: [id number] Visit Date: 05/27/2020  Today's healthcare provider: [name], MD   Chief Complaint  Patient presents with  . Follow-up   Subjective    HPI   Patient presents today in office because he had fluid drawn from his lungs last week and is still having pain when he lays down so he has to sit up all the time.  His breathing is improved since his thoracentesis.  He still has significant pain and is unable to sleep supine or laying down at all.  He remains constipated and this is aggravated a little bit by the narcotic that he has to take at night to sleep.  He is only taking it at night.  He also has decreased appetite with all of this.  He has lost 8 pounds     Medications: Outpatient Medications Prior to Visit  Medication Sig  . acetaminophen (TYLENOL) 325 MG tablet Take 2 tablets (650 mg total) by mouth 3 (three) times daily. (Patient taking differently: Take 650 mg by mouth every 6 (six) hours as needed for moderate pain. )  . amoxicillin-clavulanate (AUGMENTIN) 875-125 MG tablet Take 1 tablet by mouth 2 (two) times daily.  . cetirizine (ZYRTEC) 10 MG tablet Take 1 tablet (10 mg total) by mouth daily.  . cholecalciferol (VITAMIN D3) 25 MCG (1000 UNIT) tablet Take 1,000 Units by mouth daily.  . cyclobenzaprine (FLEXERIL) 10 MG tablet TAKE ONE TABLET BY MOUTH TWICE A DAY AS NEEDED AND TAKE ONE TABLET BY MOUTH EVERY EVENING  . docusate sodium (COLACE) 100 MG capsule Take 2 capsules (200 mg total) by mouth 2 (two) times daily.  Marland Kitchen doxycycline (VIBRA-TABS) 100 MG tablet Take 1 tablet (100 mg total) by mouth 2 (two) times daily.  Marland Kitchen dutasteride (AVODART) 0.5 MG capsule Take 0.5 mg by mouth daily.  . folic acid (FOLVITE) 1 MG tablet Take 1 tablet (1 mg total) by mouth daily.  Marland Kitchen HYDROcodone-acetaminophen (NORCO/VICODIN) 5-325 MG tablet Take 1 tablet by mouth every 6 (six) hours as  needed for moderate pain (x 5 days). Prescirbed by Carles Collet  . lisinopril (ZESTRIL) 10 MG tablet Take 1 tablet (10 mg total) by mouth daily.  . montelukast (SINGULAIR) 10 MG tablet TAKE 1 TABLET BY MOUTH EVERY DAY  . oxyCODONE-acetaminophen (PERCOCET) 5-325 MG tablet Take 1 tablet by mouth every 4 (four) hours as needed for moderate pain or severe pain.  . pantoprazole (PROTONIX) 20 MG tablet TAKE ONE TABLET BY MOUTH TWICE A DAY  . potassium chloride (K-DUR,KLOR-CON) 10 MEQ tablet Take 1 tablet (10 mEq total) by mouth 2 (two) times daily.  . rosuvastatin (CRESTOR) 10 MG tablet Take 1 tablet (10 mg total) by mouth daily.  . vitamin B-12 1000 MCG tablet Take 1 tablet (1,000 mcg total) by mouth daily.  . AMBULATORY NON FORMULARY MEDICATION Trimix (30 mg PAPA, 1 mL phen, 50 mg prostaglandin)   Vial 4ml   Dose 0.15mcg  Qty 10 #6 refills  [place] [phone] Fax [phone] (Patient not taking: Reported on 05/14/2020)  . doxazosin (CARDURA) 2 MG tablet Take 1 tablet (2 mg total) by mouth daily. (Patient not taking: Reported on 05/14/2020)  . tadalafil (ADCIRCA/CIALIS) 20 MG tablet Take 1 tablet (20 mg total) by mouth daily as needed for erectile dysfunction. (Patient not taking: Reported on 05/14/2020)  . valACYclovir (VALTREX) 1000 MG tablet  Take 1 tablet (1,000 mg total) by mouth 3 (three) times daily. (Patient not taking: Reported on 05/14/2020)   No facility-administered medications prior to visit.    Review of Systems     Objective    BP 103/65 (BP Location: Left Arm, Patient Position: Sitting, Cuff Size: Large)   Pulse 81   Temp 98.4 F (36.9 C) (Oral)   Ht 5\' 11"  (1.803 m)   Wt 196 lb 3.2 oz (89 kg)   BMI 27.36 kg/m  BP Readings from Last 3 Encounters:  05/27/20 103/65  05/20/20 117/78  05/14/20 111/73   Wt Readings from Last 3 Encounters:  05/27/20 196 lb 3.2 oz (89 kg)  05/14/20 203 lb 9.6 oz (92.4 kg)  04/30/20 204 lb (92.5 kg)      Physical  Exam Vitals reviewed.  Constitutional:      Appearance: Normal appearance.  HENT:     Right Ear: External ear normal.     Left Ear: External ear normal.  Eyes:     General: No scleral icterus.    Conjunctiva/sclera: Conjunctivae normal.  Cardiovascular:     Rate and Rhythm: Normal rate and regular rhythm.     Pulses: Normal pulses.     Heart sounds: Normal heart sounds.  Pulmonary:     Effort: Pulmonary effort is normal.     Comments: Decreased breath sounds in the left base Musculoskeletal:     Right lower leg: No edema.     Left lower leg: No edema.  Skin:    General: Skin is warm and dry.  Neurological:     General: No focal deficit present.     Mental Status: He is alert and oriented to person, place, and time.  Psychiatric:        Mood and Affect: Mood normal.        Behavior: Behavior normal.        Thought Content: Thought content normal.        Judgment: Judgment normal.       No results found for any visits on 05/27/20.  Assessment & Plan     1. Pneumonia due to infectious organism, unspecified laterality, unspecified part of lung Add Z-Pak if pleural effusion is back. - DG Chest 2 View - predniSONE (DELTASONE) 10 MG tablet; Take 1 tablet (10 mg total) by mouth daily with breakfast. 6 day taper. TAKE 6 TABLETS DAY 1. TAKE 5 TABLETS DAY 2. TAKE 4 TABLETS DAY 3. TAKE 3 TABLETS DAY 4. TAKE 2 TABLETS DAY 5. TAKE 1 TABLET DAY 6.  Dispense: 21 tablet; Refill: 0  2. Pleurisy with effusion Has appointment with pulmonary in 1 month. - DG Chest 2 View - predniSONE (DELTASONE) 10 MG tablet; Take 1 tablet (10 mg total) by mouth daily with breakfast. 6 day taper. TAKE 6 TABLETS DAY 1. TAKE 5 TABLETS DAY 2. TAKE 4 TABLETS DAY 3. TAKE 3 TABLETS DAY 4. TAKE 2 TABLETS DAY 5. TAKE 1 TABLET DAY 6.  Dispense: 21 tablet; Refill: 0  3. Pleural effusion on left Possibly will need another thoracentesis   4. Chronic constipation Get back on Linzess daily.  Continue MiraLAX  daily  5. Alcoholic cirrhosis of liver without ascites ([place]) Patient knows to cut back and stop drinking.  He does not drink much anymore.  Follow weight loss although I think it is due to the pain.   Return in about 1 week (around 06/03/2020).       [name], MD  [place] [phone] (phone) [phone] (fax)  [place]

## 2020-05-28 MED ORDER — AZITHROMYCIN 250 MG PO TABS: ORAL_TABLET | ORAL | 0 refills | Status: DC

## 2020-05-28 NOTE — Telephone Encounter (Signed)
azithromycin (ZITHROMAX) 250 MG tablet 6 tablet 0 05/28/2020    Sig: 2 p.o. on days 1 and then 1 p.o. days 2 through 5    predniSONE (DELTASONE) 10 MG tablet 21 tablet 0 05/27/2020    Sig - Route: Take 1 tablet (10 mg total) by mouth daily with breakfast. 6 day taper. TAKE 6 TABLETS DAY 1. TAKE 5 TABLETS DAY 2. TAKE 4 TABLETS DAY 3. TAKE 3 TABLETS DAY 4. TAKE 2 TABLETS DAY 5. TAKE 1 TABLET DAY 6. - Oral    Called and spoke with patient in regards to Dr. Collie Siad recommendations. Patient has been scheduled to see Dr. Patsey Berthold on 9/16 at 8:30am for follow up. He saw PCP yesterday and was given the above prednisone taper. His PCP sent in a Zpak RX today advised patient to pick it up as soon as possible so that it can hopefully help with his symptoms and he can be done with it by his follow up appointment. Patient expressed understanding. States he is also supposed to follow up with Dr. Rosanna Randy on 9/15. Advised patient if his symptoms got worse to call the office and let us know. Nothing further needed at this time.

## 2020-05-28 NOTE — Telephone Encounter (Signed)
Please let him know that thoracentesis fluid was negative for cancer cells and culture result was negative.  It did shows inflammatory cells.  His chest xray from 05/27/20 shows increased left pleural effusion again, and this likely is accounting for his symptoms.  He should continue taking prednisone as prescribed by his PCP.  He needs an ROV early next week with Dr. Patsey Berthold or NP to f/u on chest xray and determine if he needs repeat thoracentesis.

## 2020-05-28 NOTE — Telephone Encounter (Signed)
Pt's wife returning missed call. [phone]

## 2020-05-28 NOTE — Telephone Encounter (Signed)
Called and spoke with patient's wife [name] per [place]. SHe states that last Wednesday patient had a thoracentesis done. He went and saw his primary care doctor yesterday and was put on prednisone and did CXR and they are still waiting on results from that. They are calling because patient is still so sore and can't lay down to rest. Wanted to see if the provider had any recommendations.   Dr. Halford Chessman please advise if you will as Dr. Patsey Berthold is out of the office and not in [place]

## 2020-06-01 NOTE — Progress Notes (Deleted)
Established patient visit   Patient: Dwayne Thompson   DOB: 06-Jan-1946   74 y.o. Male  MRN: [id number] Visit Date: 06/03/2020  Today's healthcare provider: [name], MD   No chief complaint on file.  Subjective    HPI  ***  {Show patient history (optional):23778::" "}   Medications: Outpatient Medications Prior to Visit  Medication Sig  . acetaminophen (TYLENOL) 325 MG tablet Take 2 tablets (650 mg total) by mouth 3 (three) times daily. (Patient taking differently: Take 650 mg by mouth every 6 (six) hours as needed for moderate pain. )  . AMBULATORY NON FORMULARY MEDICATION Trimix (30 mg PAPA, 1 mL phen, 50 mg prostaglandin)   Vial 15ml   Dose 0.14mcg  Qty 10 #6 refills  [place] [phone] Fax [phone] (Patient not taking: Reported on 05/14/2020)  . amoxicillin-clavulanate (AUGMENTIN) 875-125 MG tablet Take 1 tablet by mouth 2 (two) times daily.  Marland Kitchen azithromycin (ZITHROMAX) 250 MG tablet 2 p.o. on days 1 and then 1 p.o. days 2 through 5  . cetirizine (ZYRTEC) 10 MG tablet Take 1 tablet (10 mg total) by mouth daily.  . cholecalciferol (VITAMIN D3) 25 MCG (1000 UNIT) tablet Take 1,000 Units by mouth daily.  . cyclobenzaprine (FLEXERIL) 10 MG tablet TAKE ONE TABLET BY MOUTH TWICE A DAY AS NEEDED AND TAKE ONE TABLET BY MOUTH EVERY EVENING  . docusate sodium (COLACE) 100 MG capsule Take 2 capsules (200 mg total) by mouth 2 (two) times daily.  Marland Kitchen doxazosin (CARDURA) 2 MG tablet Take 1 tablet (2 mg total) by mouth daily. (Patient not taking: Reported on 05/14/2020)  . doxycycline (VIBRA-TABS) 100 MG tablet Take 1 tablet (100 mg total) by mouth 2 (two) times daily.  Marland Kitchen dutasteride (AVODART) 0.5 MG capsule Take 0.5 mg by mouth daily.  . folic acid (FOLVITE) 1 MG tablet Take 1 tablet (1 mg total) by mouth daily.  Marland Kitchen HYDROcodone-acetaminophen (NORCO/VICODIN) 5-325 MG tablet Take 1 tablet by mouth every 6 (six) hours as needed for moderate pain (x 5 days).  Prescirbed by Carles Collet  . lisinopril (ZESTRIL) 10 MG tablet Take 1 tablet (10 mg total) by mouth daily.  . montelukast (SINGULAIR) 10 MG tablet TAKE 1 TABLET BY MOUTH EVERY DAY  . oxyCODONE-acetaminophen (PERCOCET) 5-325 MG tablet Take 1 tablet by mouth every 4 (four) hours as needed for moderate pain or severe pain.  . pantoprazole (PROTONIX) 20 MG tablet TAKE ONE TABLET BY MOUTH TWICE A DAY  . potassium chloride (K-DUR,KLOR-CON) 10 MEQ tablet Take 1 tablet (10 mEq total) by mouth 2 (two) times daily.  . predniSONE (DELTASONE) 10 MG tablet Take 1 tablet (10 mg total) by mouth daily with breakfast. 6 day taper. TAKE 6 TABLETS DAY 1. TAKE 5 TABLETS DAY 2. TAKE 4 TABLETS DAY 3. TAKE 3 TABLETS DAY 4. TAKE 2 TABLETS DAY 5. TAKE 1 TABLET DAY 6.  . rosuvastatin (CRESTOR) 10 MG tablet Take 1 tablet (10 mg total) by mouth daily.  . tadalafil (ADCIRCA/CIALIS) 20 MG tablet Take 1 tablet (20 mg total) by mouth daily as needed for erectile dysfunction. (Patient not taking: Reported on 05/14/2020)  . valACYclovir (VALTREX) 1000 MG tablet Take 1 tablet (1,000 mg total) by mouth 3 (three) times daily. (Patient not taking: Reported on 05/14/2020)  . vitamin B-12 1000 MCG tablet Take 1 tablet (1,000 mcg total) by mouth daily.   No facility-administered medications prior to visit.    Review of Systems  {Heme  Chem  Endocrine  Serology  Results Review (optional):23779::" "}  Objective    There were no vitals taken for this visit. {Show previous vital signs (optional):23777::" "}  Physical Exam  ***  No results found for any visits on 06/03/20.  Assessment & Plan     ***  No follow-ups on file.      {provider attestation***:1}   [name], MD  [facility] [phone] (phone) [phone] (fax)  [place]

## 2020-06-03 ENCOUNTER — Ambulatory Visit: Payer: Self-pay | Admitting: Family Medicine

## 2020-06-04 ENCOUNTER — Ambulatory Visit (INDEPENDENT_AMBULATORY_CARE_PROVIDER_SITE_OTHER): Payer: Medicare Other | Admitting: Pulmonary Disease

## 2020-06-04 ENCOUNTER — Encounter: Payer: Self-pay | Admitting: Pulmonary Disease

## 2020-06-04 ENCOUNTER — Other Ambulatory Visit: Payer: Self-pay

## 2020-06-04 ENCOUNTER — Other Ambulatory Visit
Admission: RE | Admit: 2020-06-04 | Discharge: 2020-06-04 | Disposition: A | Discharge status: Home / Self Care | Payer: Medicare Other | Source: Ambulatory Visit | Attending: Pulmonary Disease | Admitting: Pulmonary Disease

## 2020-06-04 VITALS — BP 118/74 | HR 89 | Temp 96.8°F | Ht 71.5 in | Wt 194.2 lb

## 2020-06-04 DIAGNOSIS — Z20822 Contact with and (suspected) exposure to covid-19: Secondary | ICD-10-CM | POA: Insufficient documentation

## 2020-06-04 DIAGNOSIS — J90 Pleural effusion, not elsewhere classified: Secondary | ICD-10-CM

## 2020-06-04 DIAGNOSIS — R0781 Pleurodynia: Secondary | ICD-10-CM

## 2020-06-04 DIAGNOSIS — Z01812 Encounter for preprocedural laboratory examination: Secondary | ICD-10-CM | POA: Diagnosis present

## 2020-06-04 LAB — SARS CORONAVIRUS 2 (TAT 6-24 HRS): SARS Coronavirus 2: NEGATIVE

## 2020-06-04 MED ORDER — INDOMETHACIN 50 MG PO CAPS: 50.0000 mg | ORAL_CAPSULE | Freq: Three times a day (TID) | ORAL | 1 refills | Status: DC | PRN

## 2020-06-04 MED ORDER — PANTOPRAZOLE SODIUM 40 MG PO TBEC: 40.0000 mg | DELAYED_RELEASE_TABLET | Freq: Two times a day (BID) | ORAL | 2 refills | Status: DC

## 2020-06-04 MED ORDER — HYDROCODONE-ACETAMINOPHEN 7.5-325 MG PO TABS: 1.0000 | ORAL_TABLET | Freq: Four times a day (QID) | ORAL | 0 refills | Status: AC | PRN

## 2020-06-04 NOTE — Progress Notes (Signed)
Subjective:    Patient ID: Dwayne Thompson, male    DOB: Jan 30, 1946, 74 y.o.   MRN: [id number]  HPI This is a 74 year old former smoker whom we have not seen here since June 2020.  At that time we saw him because of an abnormal low-dose CT chest for lung cancer screening.  The issues noted on the RIGHT lung at that time resolve completely.  The patient had continued follow-up with his primary care physician but had not followed up here as instructed.  In or around 26 April 2020 he developed some issues with left flank pain.  He was evaluated for potential renal calculi.  This pain has persisted and is now mostly located on the left lower chest.  Reviewing his imaging it appears that he had developed a left pleural effusion please see comments below for my review of his films from the 8 August on.  The patient underwent thoracentesis on 1 September which showed bloody fluid however cytology was negative and cultures have been negative.  Unfortunately chemistries are not known.  There was no cell count or differential performed.  CT scan of the chest previously obtained was performed when the patient had significant pleural effusion and there is significant atelectasis on the left lung.  The patient does not describe any issues with fevers or chills.  He complains bitterly of pleurodynia and the inability to lay back to sleep.  He has not had productive cough.  No hemoptysis.  He has not had any weight loss.  His only other complaint is that of constipation.  Patient has been treated with Augmentin and azithromycin as well as a prednisone taper.  He feels that prednisone taper helped him some but did not abate all of his symptoms.  As noted below he has had significant asbestos exposure in the past while working for [employer] and [employer].   Review of Systems A 10 point review of systems was performed and it is as noted above otherwise negative.  Allergies  Allergen Reactions  . Tamsulosin Hcl Hives   . Sulfa Antibiotics Rash   Current Meds  Medication Sig  . acetaminophen (TYLENOL) 325 MG tablet Take 2 tablets (650 mg total) by mouth 3 (three) times daily. (Patient taking differently: Take 650 mg by mouth every 6 (six) hours as needed for moderate pain. )  . AMBULATORY NON FORMULARY MEDICATION Trimix (30 mg PAPA, 1 mL phen, 50 mg prostaglandin)   Vial 86ml   Dose 0.60mcg  Qty 10 #6 refills  [place] [phone] Fax [phone]  . cetirizine (ZYRTEC) 10 MG tablet Take 1 tablet (10 mg total) by mouth daily.  . cholecalciferol (VITAMIN D3) 25 MCG (1000 UNIT) tablet Take 1,000 Units by mouth daily.  . cyclobenzaprine (FLEXERIL) 10 MG tablet TAKE ONE TABLET BY MOUTH TWICE A DAY AS NEEDED AND TAKE ONE TABLET BY MOUTH EVERY EVENING  . docusate sodium (COLACE) 100 MG capsule Take 2 capsules (200 mg total) by mouth 2 (two) times daily.  Marland Kitchen dutasteride (AVODART) 0.5 MG capsule Take 0.5 mg by mouth daily.  . folic acid (FOLVITE) 1 MG tablet Take 1 tablet (1 mg total) by mouth daily.  Marland Kitchen lisinopril (ZESTRIL) 10 MG tablet Take 1 tablet (10 mg total) by mouth daily.  . montelukast (SINGULAIR) 10 MG tablet TAKE 1 TABLET BY MOUTH EVERY DAY  . oxyCODONE-acetaminophen (PERCOCET) 5-325 MG tablet Take 1 tablet by mouth every 4 (four) hours as needed for moderate pain or severe  pain.  . potassium chloride (K-DUR,KLOR-CON) 10 MEQ tablet Take 1 tablet (10 mEq total) by mouth 2 (two) times daily.  . rosuvastatin (CRESTOR) 10 MG tablet Take 1 tablet (10 mg total) by mouth daily.  . tadalafil (ADCIRCA/CIALIS) 20 MG tablet Take 1 tablet (20 mg total) by mouth daily as needed for erectile dysfunction.  . vitamin B-12 1000 MCG tablet Take 1 tablet (1,000 mcg total) by mouth daily.  . [DISCONTINUED] amoxicillin-clavulanate (AUGMENTIN) 875-125 MG tablet Take 1 tablet by mouth 2 (two) times daily.  . [DISCONTINUED] azithromycin (ZITHROMAX) 250 MG tablet 2 p.o. on days 1 and then 1 p.o. days 2 through  5  . [DISCONTINUED] doxycycline (VIBRA-TABS) 100 MG tablet Take 1 tablet (100 mg total) by mouth 2 (two) times daily.  . [DISCONTINUED] HYDROcodone-acetaminophen (NORCO/VICODIN) 5-325 MG tablet Take 1 tablet by mouth every 6 (six) hours as needed for moderate pain (x 5 days). Prescirbed by Carles Collet  . [DISCONTINUED] predniSONE (DELTASONE) 10 MG tablet Take 1 tablet (10 mg total) by mouth daily with breakfast. 6 day taper. TAKE 6 TABLETS DAY 1. TAKE 5 TABLETS DAY 2. TAKE 4 TABLETS DAY 3. TAKE 3 TABLETS DAY 4. TAKE 2 TABLETS DAY 5. TAKE 1 TABLET DAY 6.   Immunization History  Administered Date(s) Administered  . Fluad Quad(high Dose 65+) 07/02/2019  . Hepatitis A, Adult 03/27/2019, 09/30/2019  . Hepatitis B, adult 03/27/2019, 07/29/2019, 09/30/2019  . Influenza, High Dose Seasonal PF 08/19/2015, 09/02/2016, 10/03/2017, 05/31/2018  . Influenza,inj,quad, With Preservative 05/20/2018  . Moderna SARS-COVID-2 Vaccination 11/16/2019, 12/14/2019  . Pneumococcal Conjugate-13 02/06/2014  . Pneumococcal Polysaccharide-23 03/05/2012  . Pneumococcal-Unspecified 09/19/2014  . Tdap 04/28/2007  . Zoster 12/19/2011   Social History   Tobacco Use  . Smoking status: Former Smoker    Packs/day: 1.00    Years: 50.00    Pack years: 50.00    Types: Cigarettes    Quit date: 10/20/2013    Years since quitting: 6.6  . Smokeless tobacco: Never Used  Substance Use Topics  . Alcohol use: Yes    Alcohol/week: 21.0 - 28.0 standard drinks    Types: 21 - 28 Shots of liquor per week    Comment: 3-4 gin and sodas a day   Patient does note he had asbestos exposure in the past while working for [employer] and [employer].     Objective:   Physical Exam BP 118/74 (BP Location: Left Arm, Cuff Size: Normal)   Pulse 89   Temp (!) 96.8 F (36 C) (Temporal)   Ht 5' 11.5" (1.816 m)   Wt 194 lb 3.2 oz (88.1 kg)   SpO2 96%   BMI 26.71 kg/m  GENERAL: Well-developed, well-nourished gentleman in no acute  respiratory distress.  He presents in a transport chair. HEAD: Normocephalic, atraumatic.  EYES: Pupils equal, round, reactive to light.  No scleral icterus.  MOUTH: Nose/mouth/throat not examined due to masking requirements for COVID 19. NECK: Supple. No thyromegaly. Trachea midline. No JVD.  No adenopathy. PULMONARY: Decreased air entry and mid to lower left lung field.  No adventitious sounds.  Dullness to percussion left lower lobe. CARDIOVASCULAR: S1 and S2. Regular rate and rhythm.  No rubs, murmurs or gallops heard. ABDOMEN: Benign. MUSCULOSKELETAL: No joint deformity, no clubbing, no edema.  NEUROLOGIC: No overt focal deficit.  No gait disturbance.  Speech is fluent. SKIN: Intact,warm,dry.  On limited exam: No rashes. PSYCH: Mood is depressed, behavior normal.  Review of chest x-ray from 27 May 2020: Recurrent  LEFT pleural effusion.  Reviewed all imaging, there appears to be a small pleural effusion noted in retrospect, on 26 April 2020.  Also in retrospect it shows that he had a CT performed with renal stone protocol on 06 May 2020 that showed a moderate sized left pleural effusion.  Subsequently he had a CT scan on 27 August which showed a large left pleural effusion.  Chest film of 1 September showed good management of the left pleural effusion with thoracentesis.  On 8 September as noted above he has a left recurrent pleural effusion.  Ambulatory oximetry performed today: Patient did not exhibit any desaturations.   Assessment & Plan:     ICD-10-CM   1. Pleural effusion  J90 US THORACENTESIS ASP PLEURAL SPACE W/IMG GUIDE    CT Chest Wo Contrast   LEFT pleural effusion, recurrent Referred for thoracentesis under ultrasound guidance Repeat chemistries and cytopathology Reimage with CT AFTER thora   2. Pleurodynia  R07.81    Indocin 3 times daily as needed for no more than 10 days Norco 7.5/325, 1 every 6 as needed SEVERE pain only     Meds ordered this encounter    Medications  . indomethacin (INDOCIN) 50 MG capsule    Sig: Take 1 capsule (50 mg total) by mouth 3 (three) times daily as needed for moderate pain.    Dispense:  30 capsule    Refill:  1  . pantoprazole (PROTONIX) 40 MG tablet    Sig: Take 1 tablet (40 mg total) by mouth 2 (two) times daily.    Dispense:  60 tablet    Refill:  2  . HYDROcodone-acetaminophen (NORCO) 7.5-325 MG tablet    Sig: Take 1 tablet by mouth every 6 (six) hours as needed for up to 5 days for severe pain.    Dispense:  20 tablet    Refill:  0    Orders Placed This Encounter  Procedures  . US THORACENTESIS ASP PLEURAL SPACE W/IMG GUIDE    Standing Status:   Future    Standing Expiration Date:   06/04/2021    Scheduling Instructions:     NEXT AVAILABLE.    Order Specific Question:   Are labs required for specimen collection?    Answer:   Yes    Order Specific Question:   Lab orders requested (DO NOT place separate lab orders, these will be automatically ordered during procedure specimen collection):    Answer:   Cytology - Non Pap    Order Specific Question:   Lab orders requested (DO NOT place separate lab orders, these will be automatically ordered during procedure specimen collection):    Answer:   Acid Fast Smear (Afb)    Order Specific Question:   Lab orders requested (DO NOT place separate lab orders, these will be automatically ordered during procedure specimen collection):    Answer:   Body Fluid Cell Count With Differential    Order Specific Question:   Lab orders requested (DO NOT place separate lab orders, these will be automatically ordered during procedure specimen collection):    Answer:   Body Fluid Culture    Order Specific Question:   Lab orders requested (DO NOT place separate lab orders, these will be automatically ordered during procedure specimen collection):    Answer:   Albumin, Body Fluid    Order Specific Question:   Lab orders requested (DO NOT place separate lab orders, these will be  automatically ordered during procedure specimen collection):  Answer:   Glucose, Body Fluid    Order Specific Question:   Lab orders requested (DO NOT place separate lab orders, these will be automatically ordered during procedure specimen collection):    Answer:   Fungus Culture With Stain    Order Specific Question:   Lab orders requested (DO NOT place separate lab orders, these will be automatically ordered during procedure specimen collection):    Answer:   Gram Stain    Order Specific Question:   Lab orders requested (DO NOT place separate lab orders, these will be automatically ordered during procedure specimen collection):    Answer:   Lactate Dehydrogenase, Body Fluid    Order Specific Question:   Lab orders requested (DO NOT place separate lab orders, these will be automatically ordered during procedure specimen collection):    Answer:   Ph, Body Fluid    Order Specific Question:   Reason for Exam (SYMPTOM  OR DIAGNOSIS REQUIRED)    Answer:   PLEURAL EFFUSION    Order Specific Question:   Preferred imaging location?    Answer:   [facility]  . CT Chest Wo Contrast    Standing Status:   Future    Standing Expiration Date:   06/04/2021    Scheduling Instructions:     FOLLOWING THORACENTESIS    Order Specific Question:   Preferred imaging location?    Answer:   [facility]    Order Specific Question:   Radiology Contrast Protocol - do NOT remove file path    Answer:   [email or web address]   Discussion:  The patient has a recurrent LEFT pleural effusion.  It appears that the fluid was serosanguineous on first thoracentesis performed on 20 May 2020.  This is concerning for malignancy.  Cytology at that time did not show evidence of the same.  I recommend performing another thoracentesis and obtaining chemistry, cell count differential, cultures and repeat cytology.  At the same time a CT scan of the chest should be performed once  fluid has been removed to better assess the pleural and lung parenchyma on the LEFT.  With his prior exposure to asbestos the possibility of mesothelioma is to be considered particularly given his complaint of pleuritic pain.  This is usually a predominant symptom for mesothelioma.  Other possibilities include inflammatory response to uncertain precipitating factor.  He has been treated for infection and cultures have been negative so far.active infectious process.  Unfortunately cell count on differential and chemistries were not obtained previously.  Will obtain these on repeat thoracentesis.  We will see the patient in follow-up on 20 September, he is to contact us prior to that time should any new difficulties arise.  Renold Don, MD [facility]  *This note was dictated using voice recognition software/Dragon.  Despite best efforts to proofread, errors can occur which can change the meaning.  Any change was purely unintentional.

## 2020-06-04 NOTE — Patient Instructions (Addendum)
We are arranging for your fluid to be removed again.  There will be a CT scan done after your fluid is removed.  This will give [place] a better picture of what is going on in the lung.  You will have 3 prescriptions in the pharmacy one is for a medication called indomethacin and this can be taken up to 3 times a day for his pain in the chest.  There is also Norco which is a stronger medicine, this can be used at bedtime prior to going to bed to see if it helps with the pain.  He will also get Protonix to protect the stomach, take this twice a day.   For your constipation take the Linzess previously given to you by Dr. Rosanna Randy and also take MiraLAX.  Stay well-hydrated and drink plenty of water during the day.   We will see you in follow-up on 20 September at 2:30 PM

## 2020-06-05 ENCOUNTER — Ambulatory Visit
Admission: RE | Admit: 2020-06-05 | Discharge: 2020-06-05 | Disposition: A | Discharge status: Home / Self Care | Payer: Medicare Other | Source: Ambulatory Visit | Attending: Pulmonary Disease | Admitting: Pulmonary Disease

## 2020-06-05 ENCOUNTER — Other Ambulatory Visit: Payer: Self-pay

## 2020-06-05 DIAGNOSIS — J90 Pleural effusion, not elsewhere classified: Secondary | ICD-10-CM | POA: Diagnosis not present

## 2020-06-05 LAB — LACTATE DEHYDROGENASE, PLEURAL OR PERITONEAL FLUID: LD, Fluid: 236 U/L — ABNORMAL HIGH (ref 3–23)

## 2020-06-05 LAB — BODY FLUID CELL COUNT WITH DIFFERENTIAL
Eos, Fluid: 0 %
Lymphs, Fluid: 74 %
Monocyte-Macrophage-Serous Fluid: 24 %
Neutrophil Count, Fluid: 2 %
Total Nucleated Cell Count, Fluid: 889 cu mm

## 2020-06-05 LAB — ALBUMIN, PLEURAL OR PERITONEAL FLUID: Albumin, Fluid: 3.1 g/dL

## 2020-06-05 LAB — GLUCOSE, PLEURAL OR PERITONEAL FLUID: Glucose, Fluid: 118 mg/dL

## 2020-06-05 NOTE — Procedures (Signed)
Ultrasound-guided diagnostic and therapeutic left sied thoracentesis performed yielding 1.8 liters of serosanginous colored fluid. No immediate complications.   Diagnostic fluid was sent to the lab for further analysis. Follow-up chest x-ray pending. EBL is < 2 ml .

## 2020-06-06 LAB — ACID FAST SMEAR (AFB, MYCOBACTERIA): Acid Fast Smear: NEGATIVE

## 2020-06-08 ENCOUNTER — Encounter: Payer: Self-pay | Admitting: Pulmonary Disease

## 2020-06-08 ENCOUNTER — Other Ambulatory Visit: Payer: Self-pay

## 2020-06-08 ENCOUNTER — Ambulatory Visit (INDEPENDENT_AMBULATORY_CARE_PROVIDER_SITE_OTHER): Payer: Medicare Other | Admitting: Pulmonary Disease

## 2020-06-08 VITALS — BP 120/72 | HR 91 | Temp 97.1°F | Ht 71.5 in | Wt 193.4 lb

## 2020-06-08 DIAGNOSIS — J948 Other specified pleural conditions: Secondary | ICD-10-CM

## 2020-06-08 DIAGNOSIS — R918 Other nonspecific abnormal finding of lung field: Secondary | ICD-10-CM

## 2020-06-08 DIAGNOSIS — J90 Pleural effusion, not elsewhere classified: Secondary | ICD-10-CM | POA: Diagnosis not present

## 2020-06-08 DIAGNOSIS — R0781 Pleurodynia: Secondary | ICD-10-CM

## 2020-06-08 LAB — PH, BODY FLUID: pH, Body Fluid: 7.5

## 2020-06-08 NOTE — Progress Notes (Signed)
Subjective:    Patient ID: Dwayne Thompson, male    DOB: 1946-08-23, 74 y.o.   MRN: [id number]  HPI 74 year old former smoker, last saw him on 04 June 2020 due to a recurrent left pleural effusion.  Previously the patient had been seen for abnormal low-dose CT chest for lung cancer screening for issues that were on the RIGHT lung.  These issues resolved.  We last saw him June 2020 and then he was lost to follow-up.  In around 26 April 2020 he developed issues with left flank pain.  Evaluation eventually yielded the finding of a left pleural effusion.  He underwent thoracentesis on 1 September which showed bloody fluid however cytology was negative and cultures were negative.  The patient presented for evaluation on 16 September as noted above.  He had recurrent intense pleuritic pain and the effusion had recurred.  The patient had been treated with antibiotics and prednisone without relief.  Repeat thoracentesis was performed on 17 September this was followed by imaging with CT.  The CT of the chest has been reviewed independently.  This shows pleural thickening and nodularity with a pleural-based mass and central adenopathy versus mass as well.  There is also increasing size of upper abdominal lymph nodes spacious for disease below the diaphragm.  The findings are concerning for possible mesothelioma.  The pleural fluid chemistry is consistent with exudative effusion.  The fluid was also serosanguineous concerning for carcinoma.  I have discussed all the findings with the patient and his wife who is present with him today.  The final cytology on the fluid drawn on the 17th is not back yet.  We discussed the plan of care.  The patient has had intense pleurodynia which is usually a salient symptom of mesothelioma.  We instituted management with anti-inflammatories and narcotic medication which so far has been helpful.  Review of Systems A 10 point review of systems was performed and it is as noted  above otherwise negative.  Allergies  Allergen Reactions  . Tamsulosin Hcl Hives  . Sulfa Antibiotics Rash   Current Meds  Medication Sig  . acetaminophen (TYLENOL) 325 MG tablet Take 2 tablets (650 mg total) by mouth 3 (three) times daily. (Patient taking differently: Take 650 mg by mouth every 6 (six) hours as needed for moderate pain. )  . AMBULATORY NON FORMULARY MEDICATION Trimix (30 mg PAPA, 1 mL phen, 50 mg prostaglandin)   Vial 29ml   Dose 0.32mcg  Qty 10 #6 refills  [place] [phone] Fax [phone]  . cetirizine (ZYRTEC) 10 MG tablet Take 1 tablet (10 mg total) by mouth daily.  . cholecalciferol (VITAMIN D3) 25 MCG (1000 UNIT) tablet Take 1,000 Units by mouth daily.  . cyclobenzaprine (FLEXERIL) 10 MG tablet TAKE ONE TABLET BY MOUTH TWICE A DAY AS NEEDED AND TAKE ONE TABLET BY MOUTH EVERY EVENING  . docusate sodium (COLACE) 100 MG capsule Take 2 capsules (200 mg total) by mouth 2 (two) times daily.  Marland Kitchen dutasteride (AVODART) 0.5 MG capsule Take 0.5 mg by mouth daily.  . folic acid (FOLVITE) 1 MG tablet Take 1 tablet (1 mg total) by mouth daily.  Marland Kitchen HYDROcodone-acetaminophen (NORCO) 7.5-325 MG tablet Take 1 tablet by mouth every 6 (six) hours as needed for up to 5 days for severe pain.  . indomethacin (INDOCIN) 50 MG capsule Take 1 capsule (50 mg total) by mouth 3 (three) times daily as needed for moderate pain.  Marland Kitchen lisinopril (ZESTRIL) 10 MG tablet  Take 1 tablet (10 mg total) by mouth daily.  . montelukast (SINGULAIR) 10 MG tablet TAKE 1 TABLET BY MOUTH EVERY DAY  . oxyCODONE-acetaminophen (PERCOCET) 5-325 MG tablet Take 1 tablet by mouth every 4 (four) hours as needed for moderate pain or severe pain.  . pantoprazole (PROTONIX) 40 MG tablet Take 1 tablet (40 mg total) by mouth 2 (two) times daily.  . potassium chloride (K-DUR,KLOR-CON) 10 MEQ tablet Take 1 tablet (10 mEq total) by mouth 2 (two) times daily.  . rosuvastatin (CRESTOR) 10 MG tablet Take 1 tablet  (10 mg total) by mouth daily.  . tadalafil (ADCIRCA/CIALIS) 20 MG tablet Take 1 tablet (20 mg total) by mouth daily as needed for erectile dysfunction.  . vitamin B-12 1000 MCG tablet Take 1 tablet (1,000 mcg total) by mouth daily.   Immunization History  Administered Date(s) Administered  . Fluad Quad(high Dose 65+) 07/02/2019  . Hepatitis A, Adult 03/27/2019, 09/30/2019  . Hepatitis B, adult 03/27/2019, 07/29/2019, 09/30/2019  . Influenza, High Dose Seasonal PF 08/19/2015, 09/02/2016, 10/03/2017, 05/31/2018  . Influenza,inj,quad, With Preservative 05/20/2018  . Moderna SARS-COVID-2 Vaccination 11/16/2019, 12/14/2019  . Pneumococcal Conjugate-13 02/06/2014  . Pneumococcal Polysaccharide-23 03/05/2012  . Pneumococcal-Unspecified 09/19/2014  . Tdap 04/28/2007  . Zoster 12/19/2011       Objective:   Physical Exam BP 120/72 (BP Location: Left Arm, Cuff Size: Normal)   Pulse 91   Temp (!) 97.1 F (36.2 C) (Temporal)   Ht 5' 11.5" (1.816 m)   Wt 193 lb 6.4 oz (87.7 kg)   SpO2 96%   BMI 26.60 kg/m  GENERAL: Well-developed, well-nourished gentleman in no acute respiratory distress.  He presents in a transport chair. HEAD: Normocephalic, atraumatic.  EYES: Pupils equal, round, reactive to light.  No scleral icterus.  MOUTH: Nose/mouth/throat not examined due to masking requirements for COVID 19. NECK: Supple. No thyromegaly. Trachea midline. No JVD.  No adenopathy. PULMONARY: Air entry in the mid to lower left lung field has increased from prior.  No adventitious sounds.  CARDIOVASCULAR: S1 and S2. Regular rate and rhythm.  No rubs, murmurs or gallops heard. ABDOMEN: Benign. MUSCULOSKELETAL: No joint deformity, no clubbing, no edema.  NEUROLOGIC: No overt focal deficit.Speech is fluent. SKIN: Intact,warm,dry.  On limited exam: No rashes. PSYCH: Mood is depressed, behavior normal.     Assessment & Plan:     ICD-10-CM   1. Recurrent left pleural effusion  J90 NM PET Image  Initial (PI) Skull Base To Thigh    Ambulatory referral to Cardiothoracic Surgery   Concerning for malignant pleural effusion Suspect mesothelioma versus adenocarcinoma Await cytology results Referral to thoracic surgery  2. Pleurodynia  R07.81    Better controlled with analgesia  3. Pleural mass  J94.8    Concerning for either metastatic deposit versus mesothelioma Given intense pleurodynia suspect mesothelioma PET/CT  4. Abnormal findings on diagnostic imaging of lung  R91.8    PET/CT should help delineate whether there is adenopathy versus separate mass Further work-up as needed   Orders Placed This Encounter  Procedures  . NM PET Image Initial (PI) Skull Base To Thigh    Standing Status:   Future    Standing Expiration Date:   06/08/2021    Order Specific Question:   If indicated for the ordered procedure, I authorize the administration of a radiopharmaceutical per Radiology protocol    Answer:   Yes    Order Specific Question:   Preferred imaging location?    Answer:  [facility]    Order Specific Question:   Radiology Contrast Protocol - do NOT remove file path    Answer:   [email or web address]  . Ambulatory referral to Cardiothoracic Surgery    Referral Priority:   Routine    Referral Type:   Surgical    Referral Reason:   Specialty Services Required    Requested Specialty:   Cardiothoracic Surgery    Number of Visits Requested:   1    Discussion:  Patient has a recurrent left pleural effusion with associated pleural-based mass and intense pleurodynia.  Suspect that this is related to mesothelioma.  He had repeat thoracentesis on 17 September and we are awaiting the final pathology report on that specimen.  Proceed to obtain PET/CT for evaluation of this pleural-based mass and effusion as well as potential adenopathy below the diaphragm and potential associated lung mass.  In the interim I will have the patient evaluated by  thoracic surgery in case intervention is necessary to prevent reaccumulation of fluid.  We will see the patient in follow-up 11 October.  He is to contact us prior to that time should any new difficulties arise.  In the interim we will also contact him with the results of testing as it comes back.  Renold Don, MD [facility]   *This note was dictated using voice recognition software/Dragon.  Despite best efforts to proofread, errors can occur which can change the meaning.  Any change was purely unintentional.

## 2020-06-08 NOTE — Patient Instructions (Addendum)
We discussed the concerns that I have about your scan that was done on Friday.  I am concerned about a type of cancer called mesothelioma.   I am waiting for the final pathology report on the fluid that was drawn on Friday.   We are getting a PET/CT to better determine how to proceed.   I have let Dr. Rosanna Randy and Dr. Mike Gip know about the findings.    I will refer you also to Dr. Genevive Bi the thoracic surgeon.   We will see him in follow-up in 2 to 3 weeks time with either me or the nurse practitioner.  In the interim we will keep in touch via phone with what ever needs to be done in between.

## 2020-06-09 ENCOUNTER — Encounter: Payer: Self-pay | Admitting: Family Medicine

## 2020-06-09 ENCOUNTER — Encounter: Payer: Medicare Other | Admitting: Physical Therapy

## 2020-06-09 LAB — CYTOLOGY - NON PAP

## 2020-06-09 LAB — BODY FLUID CULTURE: Culture: NO GROWTH

## 2020-06-12 ENCOUNTER — Other Ambulatory Visit: Payer: Self-pay

## 2020-06-12 ENCOUNTER — Encounter (HOSPITAL_COMMUNITY)
Admission: RE | Admit: 2020-06-12 | Discharge: 2020-06-12 | Disposition: A | Discharge status: Home / Self Care | Payer: Medicare Other | Source: Ambulatory Visit | Attending: Pulmonary Disease | Admitting: Pulmonary Disease

## 2020-06-12 DIAGNOSIS — J90 Pleural effusion, not elsewhere classified: Secondary | ICD-10-CM | POA: Insufficient documentation

## 2020-06-12 DIAGNOSIS — I700 Atherosclerosis of aorta: Secondary | ICD-10-CM | POA: Insufficient documentation

## 2020-06-12 DIAGNOSIS — J439 Emphysema, unspecified: Secondary | ICD-10-CM | POA: Diagnosis not present

## 2020-06-12 DIAGNOSIS — R590 Localized enlarged lymph nodes: Secondary | ICD-10-CM | POA: Diagnosis not present

## 2020-06-12 DIAGNOSIS — M899 Disorder of bone, unspecified: Secondary | ICD-10-CM | POA: Diagnosis not present

## 2020-06-12 LAB — GLUCOSE, CAPILLARY: Glucose-Capillary: 99 mg/dL (ref 70–99)

## 2020-06-12 MED ORDER — FLUDEOXYGLUCOSE F - 18 (FDG) INJECTION
10.6300 | Freq: Once | INTRAVENOUS | Status: AC
  Administered 2020-06-12: 10.63 via INTRAVENOUS

## 2020-06-16 ENCOUNTER — Encounter: Payer: Medicare Other | Admitting: Physical Therapy

## 2020-06-16 NOTE — Progress Notes (Addendum)
I,Norleen Xie,acting as a scribe for [name], MD.,have documented all relevant documentation on the behalf of [name], MD,as directed by  [name], MD while in the presence of [name], MD.   Established patient visit   Patient: Dwayne Thompson   DOB: 02-Nov-1945   74 y.o. Male  MRN: [id number] Visit Date: 06/17/2020  Today's healthcare provider: [name], MD   Chief Complaint  Patient presents with  . Follow-up   Subjective    HPI  Patient is not feeling well at all.  His pain persist and he cannot lay down at night due to the pain.  His breathing is fair.  He has no energy.  No fevers or cough.  He is having swelling of his feet.  He has been on indomethacin for the pain. Pneumonia due to infectious organism, unspecified laterality, unspecified part of lung From 05/27/2020-Added Z-Pak if pleural effusion is back. Given rx for predniSONE 6 day taper. Chest x-ray showing-Some of the fluid is coming back on his lung/in the pleural effusion. Continue medications as prescribed and have sent in another antibiotic for him to take for a few days.  Pleurisy with effusion From 05/27/2020-Has appointment with pulmonary in 1 month.  Chronic constipation From 05/27/2020-Get back on Linzess daily.  Continue MiraLAX daily.       Medications: Outpatient Medications Prior to Visit  Medication Sig  . acetaminophen (TYLENOL) 325 MG tablet Take 2 tablets (650 mg total) by mouth 3 (three) times daily. (Patient taking differently: Take 650 mg by mouth every 6 (six) hours as needed for moderate pain. )  . cetirizine (ZYRTEC) 10 MG tablet Take 1 tablet (10 mg total) by mouth daily.  . cholecalciferol (VITAMIN D3) 25 MCG (1000 UNIT) tablet Take 1,000 Units by mouth daily.  Marland Kitchen docusate sodium (COLACE) 100 MG capsule Take 2 capsules (200 mg total) by mouth 2 (two) times daily.  Marland Kitchen dutasteride (AVODART) 0.5 MG capsule Take 0.5 mg by mouth daily.  . folic  acid (FOLVITE) 1 MG tablet Take 1 tablet (1 mg total) by mouth daily.  . indomethacin (INDOCIN) 50 MG capsule Take 1 capsule (50 mg total) by mouth 3 (three) times daily as needed for moderate pain.  Marland Kitchen lisinopril (ZESTRIL) 10 MG tablet Take 1 tablet (10 mg total) by mouth daily.  . montelukast (SINGULAIR) 10 MG tablet TAKE 1 TABLET BY MOUTH EVERY DAY  . oxyCODONE-acetaminophen (PERCOCET) 5-325 MG tablet Take 1 tablet by mouth every 4 (four) hours as needed for moderate pain or severe pain.  . pantoprazole (PROTONIX) 40 MG tablet Take 1 tablet (40 mg total) by mouth 2 (two) times daily.  . potassium chloride (K-DUR,KLOR-CON) 10 MEQ tablet Take 1 tablet (10 mEq total) by mouth 2 (two) times daily.  . rosuvastatin (CRESTOR) 10 MG tablet Take 1 tablet (10 mg total) by mouth daily.  . vitamin B-12 1000 MCG tablet Take 1 tablet (1,000 mcg total) by mouth daily.  . AMBULATORY NON FORMULARY MEDICATION Trimix (30 mg PAPA, 1 mL phen, 50 mg prostaglandin)   Vial 52ml   Dose 0.72mcg  Qty 10 #6 refills  [place] [phone] Fax [phone] (Patient not taking: Reported on 06/17/2020)  . cyclobenzaprine (FLEXERIL) 10 MG tablet TAKE ONE TABLET BY MOUTH TWICE A DAY AS NEEDED AND TAKE ONE TABLET BY MOUTH EVERY EVENING (Patient not taking: Reported on 06/17/2020)  . doxazosin (CARDURA) 2 MG tablet Take 1 tablet (2 mg  total) by mouth daily. (Patient not taking: Reported on 05/14/2020)  . tadalafil (ADCIRCA/CIALIS) 20 MG tablet Take 1 tablet (20 mg total) by mouth daily as needed for erectile dysfunction. (Patient not taking: Reported on 06/17/2020)  . valACYclovir (VALTREX) 1000 MG tablet Take 1 tablet (1,000 mg total) by mouth 3 (three) times daily. (Patient not taking: Reported on 06/08/2020)   No facility-administered medications prior to visit.    Review of Systems  Constitutional: Negative for appetite change, chills and fever.  Respiratory: Negative for chest tightness, shortness of breath  and wheezing.   Cardiovascular: Negative for chest pain and palpitations.  Gastrointestinal: Negative for abdominal pain, nausea and vomiting.       Objective    BP (!) 79/43 (BP Location: Right Arm, Patient Position: Sitting, Cuff Size: Large)   Pulse 97   Temp (!) 97.4 F (36.3 C) (Oral)   Resp 18   Ht 6' (1.829 m)   Wt 195 lb (88.5 kg)   SpO2 98%   BMI 26.45 kg/m    Physical Exam Vitals reviewed.  Constitutional:      Appearance: Normal appearance.  HENT:     Right Ear: External ear normal.     Left Ear: External ear normal.  Eyes:     General: No scleral icterus.    Conjunctiva/sclera: Conjunctivae normal.  Cardiovascular:     Rate and Rhythm: Normal rate and regular rhythm.     Pulses: Normal pulses.     Heart sounds: Normal heart sounds.  Pulmonary:     Effort: Pulmonary effort is normal.     Comments: Decreased breath sounds in the left lower half of the  lung field. Musculoskeletal:     Right lower leg: No edema.     Left lower leg: No edema.     Comments: He has pedal and ankle edema only.  Skin:    General: Skin is warm and dry.  Neurological:     General: No focal deficit present.     Mental Status: He is alert and oriented to person, place, and time.  Psychiatric:        Mood and Affect: Mood normal.        Behavior: Behavior normal.        Thought Content: Thought content normal.        Judgment: Judgment normal.        No results found for any visits on 06/17/20.  Assessment & Plan     1. Chest pain, unspecified type Due to pleural effusion, likely cancerous etiology.  He has appointment with Dr. Faith Rogue from thoracic surgery for probable consideration of getting a tissue diagnosis of this. - ketorolac (TORADOL) injection 60 mg - CBC w/Diff/Platelet - Comprehensive Metabolic Panel (CMET) - DG Chest 2 View  2. Hypotension, unspecified hypotension type Stop taking lisinopril and Indomethacin.  Push fluids. - CBC w/Diff/Platelet -  Comprehensive Metabolic Panel (CMET)  3. Pleural effusion on left For his pain will increase Norco to 06/21/2024 every 4 hours as needed #55 no refills.  May need oxycodone sooner rather than later.  He is warned about the constipation effect. - DG Chest 2 View  4. Pneumonia due to infectious organism, unspecified laterality, unspecified part of lung Repeat chest x-ray to see the extent of the pleural effusion.  I am worried about rapid progression in this patient.  He may need earlier intervention than planned. - DG Chest 2 View  Addendum on September 30.  Call patient to  inform of lab work and have him increase fluids to protect kidneys.  He was able to get some sleep but still cannot lie back due to the pain and not quite enough pain relief with hydrocodone 10.  We will send in oxycodone 5 mg daily.  He is advised not to take both at the same time.  He and his wife are both on the phone and aware.  No follow-ups on file.      I, [name], MD, have reviewed all documentation for this visit. The documentation on 06/18/20 for the exam, diagnosis, procedures, and orders are all accurate and complete.    Richard Cranford Mon, MD  [facility] [phone] (phone) [phone] (fax)  [place]

## 2020-06-17 ENCOUNTER — Telehealth: Payer: Self-pay

## 2020-06-17 ENCOUNTER — Other Ambulatory Visit: Payer: Self-pay

## 2020-06-17 ENCOUNTER — Ambulatory Visit (INDEPENDENT_AMBULATORY_CARE_PROVIDER_SITE_OTHER): Payer: Medicare Other | Admitting: Family Medicine

## 2020-06-17 ENCOUNTER — Ambulatory Visit
Admission: RE | Admit: 2020-06-17 | Discharge: 2020-06-17 | Disposition: A | Discharge status: Home / Self Care | Payer: Medicare Other | Source: Ambulatory Visit | Attending: Family Medicine | Admitting: Family Medicine

## 2020-06-17 ENCOUNTER — Ambulatory Visit
Admission: RE | Admit: 2020-06-17 | Discharge: 2020-06-17 | Disposition: A | Discharge status: Home / Self Care | Payer: Medicare Other | Attending: Family Medicine | Admitting: Family Medicine

## 2020-06-17 ENCOUNTER — Encounter: Payer: Self-pay | Admitting: Family Medicine

## 2020-06-17 ENCOUNTER — Encounter (HOSPITAL_COMMUNITY): Payer: Self-pay | Admitting: Radiology

## 2020-06-17 VITALS — BP 79/43 | HR 97 | Temp 97.4°F | Resp 18 | Ht 72.0 in | Wt 195.0 lb

## 2020-06-17 DIAGNOSIS — J90 Pleural effusion, not elsewhere classified: Secondary | ICD-10-CM

## 2020-06-17 DIAGNOSIS — I959 Hypotension, unspecified: Secondary | ICD-10-CM

## 2020-06-17 DIAGNOSIS — J189 Pneumonia, unspecified organism: Secondary | ICD-10-CM | POA: Diagnosis present

## 2020-06-17 DIAGNOSIS — R079 Chest pain, unspecified: Secondary | ICD-10-CM | POA: Insufficient documentation

## 2020-06-17 DIAGNOSIS — J948 Other specified pleural conditions: Secondary | ICD-10-CM

## 2020-06-17 MED ORDER — HYDROCODONE-ACETAMINOPHEN 10-325 MG PO TABS
1.0000 | ORAL_TABLET | ORAL | 0 refills | Status: DC | PRN
Start: 2020-06-17 — End: 2020-07-02

## 2020-06-17 MED ORDER — KETOROLAC TROMETHAMINE 60 MG/2ML IM SOLN
60.0000 mg | Freq: Once | INTRAMUSCULAR | Status: AC
  Administered 2020-06-17: 60 mg via INTRAMUSCULAR

## 2020-06-17 NOTE — Progress Notes (Signed)
Dwayne Thompson. [name] Male, 74 y.o., 1946/05/19 MRN:  [id number] Phone:  [phone] (H) PCP:  Jerrol Banana., MD Coverage:  [place] [employer] Next Appt With Radiology ([facility]-CT 3) 06/23/2020 at 11:00 AM  RE: CT Biopsy Received: Today Dwayne Cleveland, MD  [name]   CT core L pleural mass  anterior inferior see PET   [facility]       Previous Messages   ----- Message -----  From: Garth Bigness D  Sent: 06/17/2020  1:33 PM EDT  To: [facility] Procedure Requests  Subject: CT Biopsy                     Procedure:  CT Biopsy   Reason: Pleural mass   History: NM PET, CT, [place] in computer   Provider: Tyler Pita   Provider Contact: [phone]

## 2020-06-17 NOTE — Telephone Encounter (Signed)
Copied from [place] [phone]. Topic: General - Other >> Jun 17, 2020 12:25 PM Erick Blinks wrote: Reason for CRM: Tiffany calling from the outpatient imaging center in [place], the pt is there for an appt but his order needs to be signed.   Best contact: [phone]

## 2020-06-17 NOTE — Patient Instructions (Signed)
Push fluids. Stop taking lisinopril and Indomethacin.

## 2020-06-17 NOTE — Telephone Encounter (Addendum)
X-ray was completed. Follow up scheduled for 06/24/2020 at 10:40 am.

## 2020-06-18 LAB — COMPREHENSIVE METABOLIC PANEL
ALT: 21 IU/L (ref 0–44)
AST: 22 IU/L (ref 0–40)
Albumin/Globulin Ratio: 1.2 (ref 1.2–2.2)
Albumin: 3.4 g/dL — ABNORMAL LOW (ref 3.7–4.7)
Alkaline Phosphatase: 67 IU/L (ref 44–121)
BUN/Creatinine Ratio: 26 — ABNORMAL HIGH (ref 10–24)
BUN: 51 mg/dL — ABNORMAL HIGH (ref 8–27)
Bilirubin Total: 0.3 mg/dL (ref 0.0–1.2)
CO2: 18 mmol/L — ABNORMAL LOW (ref 20–29)
Calcium: 9.1 mg/dL (ref 8.6–10.2)
Chloride: 101 mmol/L (ref 96–106)
Creatinine, Ser: 1.98 mg/dL — ABNORMAL HIGH (ref 0.76–1.27)
GFR calc Af Amer: 37 mL/min/{1.73_m2} — ABNORMAL LOW (ref >59)
GFR calc non Af Amer: 32 mL/min/{1.73_m2} — ABNORMAL LOW (ref >59)
Globulin, Total: 2.8 g/dL (ref 1.5–4.5)
Glucose: 81 mg/dL (ref 65–99)
Potassium: 5.2 mmol/L (ref 3.5–5.2)
Sodium: 138 mmol/L (ref 134–144)
Total Protein: 6.2 g/dL (ref 6.0–8.5)

## 2020-06-18 LAB — CBC WITH DIFFERENTIAL/PLATELET
Basophils Absolute: 0 10*3/uL (ref 0.0–0.2)
Basos: 0 %
EOS (ABSOLUTE): 0.2 10*3/uL (ref 0.0–0.4)
Eos: 2 %
Hematocrit: 38.4 % (ref 37.5–51.0)
Hemoglobin: 12.9 g/dL — ABNORMAL LOW (ref 13.0–17.7)
Immature Grans (Abs): 0 10*3/uL (ref 0.0–0.1)
Immature Granulocytes: 0 %
Lymphocytes Absolute: 0.9 10*3/uL (ref 0.7–3.1)
Lymphs: 11 %
MCH: 30.9 pg (ref 26.6–33.0)
MCHC: 33.6 g/dL (ref 31.5–35.7)
MCV: 92 fL (ref 79–97)
Monocytes Absolute: 0.9 10*3/uL (ref 0.1–0.9)
Monocytes: 10 %
Neutrophils Absolute: 6.7 10*3/uL (ref 1.4–7.0)
Neutrophils: 77 %
Platelets: 174 10*3/uL (ref 150–450)
RBC: 4.18 x10E6/uL (ref 4.14–5.80)
RDW: 12.5 % (ref 11.6–15.4)
WBC: 8.7 10*3/uL (ref 3.4–10.8)

## 2020-06-18 MED ORDER — OXYCODONE HCL 5 MG PO TABS: 5.0000 mg | ORAL_TABLET | ORAL | 0 refills | Status: DC | PRN

## 2020-06-18 NOTE — Addendum Note (Signed)
Addended by: Eulas Post on: 06/18/2020 09:41 AM   Modules accepted: Orders

## 2020-06-20 ENCOUNTER — Other Ambulatory Visit (HOSPITAL_COMMUNITY)
Admission: RE | Admit: 2020-06-20 | Discharge: 2020-06-20 | Disposition: A | Discharge status: Home / Self Care | Payer: Medicare Other | Source: Ambulatory Visit | Attending: Pulmonary Disease | Admitting: Pulmonary Disease

## 2020-06-20 DIAGNOSIS — Z20822 Contact with and (suspected) exposure to covid-19: Secondary | ICD-10-CM | POA: Insufficient documentation

## 2020-06-20 DIAGNOSIS — Z01818 Encounter for other preprocedural examination: Secondary | ICD-10-CM | POA: Insufficient documentation

## 2020-06-20 LAB — SARS CORONAVIRUS 2 (TAT 6-24 HRS): SARS Coronavirus 2: NEGATIVE

## 2020-06-21 ENCOUNTER — Encounter: Payer: Self-pay | Admitting: Family Medicine

## 2020-06-22 ENCOUNTER — Other Ambulatory Visit: Payer: Self-pay | Admitting: Student

## 2020-06-22 ENCOUNTER — Other Ambulatory Visit: Payer: Self-pay

## 2020-06-22 ENCOUNTER — Other Ambulatory Visit: Payer: Self-pay | Admitting: Physician Assistant

## 2020-06-22 ENCOUNTER — Encounter: Payer: Self-pay | Admitting: Cardiothoracic Surgery

## 2020-06-22 ENCOUNTER — Inpatient Hospital Stay
Admission: EM | Admit: 2020-06-22 | Discharge: 2020-07-02 | DRG: 163 | Disposition: A | Discharge status: Home / Self Care | Payer: Medicare Other | Source: Ambulatory Visit | Attending: Internal Medicine | Admitting: Internal Medicine

## 2020-06-22 ENCOUNTER — Ambulatory Visit (INDEPENDENT_AMBULATORY_CARE_PROVIDER_SITE_OTHER): Payer: Medicare Other | Admitting: Cardiothoracic Surgery

## 2020-06-22 ENCOUNTER — Emergency Department: Payer: Medicare Other

## 2020-06-22 ENCOUNTER — Encounter: Payer: Self-pay | Admitting: Emergency Medicine

## 2020-06-22 ENCOUNTER — Inpatient Hospital Stay: Payer: Medicare Other

## 2020-06-22 VITALS — BP 75/43 | HR 115 | Temp 97.9°F | Resp 16 | Ht 71.5 in | Wt 195.0 lb

## 2020-06-22 DIAGNOSIS — N179 Acute kidney failure, unspecified: Secondary | ICD-10-CM | POA: Diagnosis present

## 2020-06-22 DIAGNOSIS — N138 Other obstructive and reflux uropathy: Secondary | ICD-10-CM | POA: Diagnosis not present

## 2020-06-22 DIAGNOSIS — I10 Essential (primary) hypertension: Secondary | ICD-10-CM

## 2020-06-22 DIAGNOSIS — J910 Malignant pleural effusion: Secondary | ICD-10-CM | POA: Diagnosis present

## 2020-06-22 DIAGNOSIS — Z515 Encounter for palliative care: Secondary | ICD-10-CM | POA: Diagnosis not present

## 2020-06-22 DIAGNOSIS — K7460 Unspecified cirrhosis of liver: Secondary | ICD-10-CM | POA: Diagnosis present

## 2020-06-22 DIAGNOSIS — R918 Other nonspecific abnormal finding of lung field: Secondary | ICD-10-CM

## 2020-06-22 DIAGNOSIS — J942 Hemothorax: Secondary | ICD-10-CM | POA: Diagnosis present

## 2020-06-22 DIAGNOSIS — C3490 Malignant neoplasm of unspecified part of unspecified bronchus or lung: Secondary | ICD-10-CM | POA: Diagnosis present

## 2020-06-22 DIAGNOSIS — L89152 Pressure ulcer of sacral region, stage 2: Secondary | ICD-10-CM | POA: Diagnosis present

## 2020-06-22 DIAGNOSIS — Z882 Allergy status to sulfonamides status: Secondary | ICD-10-CM

## 2020-06-22 DIAGNOSIS — E871 Hypo-osmolality and hyponatremia: Secondary | ICD-10-CM | POA: Diagnosis present

## 2020-06-22 DIAGNOSIS — I129 Hypertensive chronic kidney disease with stage 1 through stage 4 chronic kidney disease, or unspecified chronic kidney disease: Secondary | ICD-10-CM | POA: Diagnosis present

## 2020-06-22 DIAGNOSIS — N189 Chronic kidney disease, unspecified: Secondary | ICD-10-CM | POA: Diagnosis present

## 2020-06-22 DIAGNOSIS — E875 Hyperkalemia: Secondary | ICD-10-CM | POA: Diagnosis present

## 2020-06-22 DIAGNOSIS — C7951 Secondary malignant neoplasm of bone: Secondary | ICD-10-CM | POA: Diagnosis not present

## 2020-06-22 DIAGNOSIS — D630 Anemia in neoplastic disease: Secondary | ICD-10-CM | POA: Diagnosis present

## 2020-06-22 DIAGNOSIS — E785 Hyperlipidemia, unspecified: Secondary | ICD-10-CM | POA: Diagnosis present

## 2020-06-22 DIAGNOSIS — J948 Other specified pleural conditions: Secondary | ICD-10-CM | POA: Diagnosis not present

## 2020-06-22 DIAGNOSIS — J449 Chronic obstructive pulmonary disease, unspecified: Secondary | ICD-10-CM | POA: Diagnosis present

## 2020-06-22 DIAGNOSIS — Z23 Encounter for immunization: Secondary | ICD-10-CM

## 2020-06-22 DIAGNOSIS — K5900 Constipation, unspecified: Secondary | ICD-10-CM | POA: Diagnosis not present

## 2020-06-22 DIAGNOSIS — J9600 Acute respiratory failure, unspecified whether with hypoxia or hypercapnia: Secondary | ICD-10-CM

## 2020-06-22 DIAGNOSIS — K219 Gastro-esophageal reflux disease without esophagitis: Secondary | ICD-10-CM | POA: Diagnosis present

## 2020-06-22 DIAGNOSIS — C801 Malignant (primary) neoplasm, unspecified: Secondary | ICD-10-CM

## 2020-06-22 DIAGNOSIS — Z09 Encounter for follow-up examination after completed treatment for conditions other than malignant neoplasm: Secondary | ICD-10-CM

## 2020-06-22 DIAGNOSIS — J9601 Acute respiratory failure with hypoxia: Secondary | ICD-10-CM | POA: Diagnosis not present

## 2020-06-22 DIAGNOSIS — I9589 Other hypotension: Secondary | ICD-10-CM

## 2020-06-22 DIAGNOSIS — I959 Hypotension, unspecified: Secondary | ICD-10-CM | POA: Diagnosis present

## 2020-06-22 DIAGNOSIS — D696 Thrombocytopenia, unspecified: Secondary | ICD-10-CM | POA: Diagnosis present

## 2020-06-22 DIAGNOSIS — N401 Enlarged prostate with lower urinary tract symptoms: Secondary | ICD-10-CM

## 2020-06-22 DIAGNOSIS — Z87891 Personal history of nicotine dependence: Secondary | ICD-10-CM

## 2020-06-22 DIAGNOSIS — R0789 Other chest pain: Secondary | ICD-10-CM

## 2020-06-22 DIAGNOSIS — Z9689 Presence of other specified functional implants: Secondary | ICD-10-CM

## 2020-06-22 DIAGNOSIS — J9621 Acute and chronic respiratory failure with hypoxia: Secondary | ICD-10-CM | POA: Diagnosis present

## 2020-06-22 DIAGNOSIS — J90 Pleural effusion, not elsewhere classified: Secondary | ICD-10-CM

## 2020-06-22 DIAGNOSIS — Z79899 Other long term (current) drug therapy: Secondary | ICD-10-CM | POA: Diagnosis not present

## 2020-06-22 DIAGNOSIS — L8990 Pressure ulcer of unspecified site, unspecified stage: Secondary | ICD-10-CM | POA: Insufficient documentation

## 2020-06-22 DIAGNOSIS — L89312 Pressure ulcer of right buttock, stage 2: Secondary | ICD-10-CM | POA: Diagnosis not present

## 2020-06-22 DIAGNOSIS — Z888 Allergy status to other drugs, medicaments and biological substances status: Secondary | ICD-10-CM

## 2020-06-22 DIAGNOSIS — R0602 Shortness of breath: Secondary | ICD-10-CM

## 2020-06-22 DIAGNOSIS — Z818 Family history of other mental and behavioral disorders: Secondary | ICD-10-CM

## 2020-06-22 DIAGNOSIS — Z820 Family history of epilepsy and other diseases of the nervous system: Secondary | ICD-10-CM

## 2020-06-22 DIAGNOSIS — R0902 Hypoxemia: Secondary | ICD-10-CM

## 2020-06-22 DIAGNOSIS — Z7189 Other specified counseling: Secondary | ICD-10-CM | POA: Diagnosis not present

## 2020-06-22 DIAGNOSIS — C3432 Malignant neoplasm of lower lobe, left bronchus or lung: Secondary | ICD-10-CM | POA: Diagnosis not present

## 2020-06-22 LAB — BASIC METABOLIC PANEL
Anion gap: 10 (ref 5–15)
BUN: 70 mg/dL — ABNORMAL HIGH (ref 8–23)
CO2: 21 mmol/L — ABNORMAL LOW (ref 22–32)
Calcium: 8.9 mg/dL (ref 8.9–10.3)
Chloride: 100 mmol/L (ref 98–111)
Creatinine, Ser: 2.75 mg/dL — ABNORMAL HIGH (ref 0.61–1.24)
GFR calc Af Amer: 25 mL/min — ABNORMAL LOW (ref >60)
GFR calc non Af Amer: 22 mL/min — ABNORMAL LOW (ref >60)
Glucose, Bld: 106 mg/dL — ABNORMAL HIGH (ref 70–99)
Potassium: 5.5 mmol/L — ABNORMAL HIGH (ref 3.5–5.1)
Sodium: 131 mmol/L — ABNORMAL LOW (ref 135–145)

## 2020-06-22 LAB — HEMOGLOBIN AND HEMATOCRIT, BLOOD
HCT: 32.2 % — ABNORMAL LOW (ref 39.0–52.0)
HCT: 25.8 % — ABNORMAL LOW (ref 39.0–52.0)
Hemoglobin: 11 g/dL — ABNORMAL LOW (ref 13.0–17.0)
Hemoglobin: 9.3 g/dL — ABNORMAL LOW (ref 13.0–17.0)

## 2020-06-22 LAB — CBC
HCT: 29.1 % — ABNORMAL LOW (ref 39.0–52.0)
Hemoglobin: 9.8 g/dL — ABNORMAL LOW (ref 13.0–17.0)
MCH: 30.6 pg (ref 26.0–34.0)
MCHC: 33.7 g/dL (ref 30.0–36.0)
MCV: 90.9 fL (ref 80.0–100.0)
Platelets: 220 10*3/uL (ref 150–400)
RBC: 3.2 MIL/uL — ABNORMAL LOW (ref 4.22–5.81)
RDW: 12.5 % (ref 11.5–15.5)
WBC: 9.8 10*3/uL (ref 4.0–10.5)
nRBC: 0 % (ref 0.0–0.2)

## 2020-06-22 LAB — PROTIME-INR
INR: 1.2 (ref 0.8–1.2)
Prothrombin Time: 14.5 seconds (ref 11.4–15.2)

## 2020-06-22 LAB — TROPONIN I (HIGH SENSITIVITY)
Troponin I (High Sensitivity): 4 ng/L (ref <18)
Troponin I (High Sensitivity): 3 ng/L (ref <18)

## 2020-06-22 LAB — APTT: aPTT: 35 seconds (ref 24–36)

## 2020-06-22 MED ORDER — ACETAMINOPHEN 650 MG RE SUPP: 650.0000 mg | Freq: Four times a day (QID) | RECTAL | Status: DC | PRN

## 2020-06-22 MED ORDER — INSULIN ASPART 100 UNIT/ML IV SOLN
10.0000 [IU] | Freq: Once | INTRAVENOUS | Status: AC
  Administered 2020-06-23: 10 [IU] via INTRAVENOUS
  Filled 2020-06-22: qty 0.1

## 2020-06-22 MED ORDER — FOLIC ACID 1 MG PO TABS
1.0000 mg | ORAL_TABLET | Freq: Every day | ORAL | Status: DC
  Administered 2020-06-23 – 2020-07-02 (×9): 1 mg via ORAL
  Filled 2020-06-22 (×9): qty 1

## 2020-06-22 MED ORDER — ONDANSETRON HCL 4 MG PO TABS
4.0000 mg | ORAL_TABLET | Freq: Four times a day (QID) | ORAL | Status: DC | PRN
  Administered 2020-06-25: 4 mg via ORAL
  Filled 2020-06-22: qty 1

## 2020-06-22 MED ORDER — VITAMIN B-12 1000 MCG PO TABS
1000.0000 ug | ORAL_TABLET | Freq: Every day | ORAL | Status: DC
  Administered 2020-06-23 – 2020-07-02 (×9): 1000 ug via ORAL
  Filled 2020-06-22 (×10): qty 1

## 2020-06-22 MED ORDER — DICLOFENAC SODIUM 1 % EX GEL
2.0000 g | Freq: Four times a day (QID) | CUTANEOUS | Status: DC | PRN
  Filled 2020-06-22: qty 100

## 2020-06-22 MED ORDER — SODIUM CHLORIDE 0.9 % IV SOLN: INTRAVENOUS | Status: DC

## 2020-06-22 MED ORDER — ACETAMINOPHEN 325 MG PO TABS
650.0000 mg | ORAL_TABLET | Freq: Four times a day (QID) | ORAL | Status: DC | PRN
  Filled 2020-06-22: qty 2

## 2020-06-22 MED ORDER — ONDANSETRON HCL 4 MG/2ML IJ SOLN: 4.0000 mg | Freq: Four times a day (QID) | INTRAMUSCULAR | Status: DC | PRN

## 2020-06-22 MED ORDER — OXYCODONE HCL 5 MG PO TABS
5.0000 mg | ORAL_TABLET | ORAL | Status: DC | PRN
  Administered 2020-06-23: 5 mg via ORAL
  Filled 2020-06-22: qty 1

## 2020-06-22 MED ORDER — TAMSULOSIN HCL 0.4 MG PO CAPS
0.4000 mg | ORAL_CAPSULE | Freq: Every day | ORAL | Status: DC
  Administered 2020-06-23 – 2020-06-27 (×5): 0.4 mg via ORAL
  Filled 2020-06-22 (×5): qty 1

## 2020-06-22 MED ORDER — VITAMIN D 25 MCG (1000 UNIT) PO TABS
1000.0000 [IU] | ORAL_TABLET | Freq: Every day | ORAL | Status: DC
  Administered 2020-06-23 – 2020-07-02 (×9): 1000 [IU] via ORAL
  Filled 2020-06-22 (×9): qty 1

## 2020-06-22 MED ORDER — ACETAMINOPHEN 325 MG PO TABS
650.0000 mg | ORAL_TABLET | Freq: Four times a day (QID) | ORAL | Status: DC | PRN
  Administered 2020-06-22 – 2020-06-27 (×4): 650 mg via ORAL
  Filled 2020-06-22 (×3): qty 2

## 2020-06-22 MED ORDER — LIDOCAINE-EPINEPHRINE 2 %-1:100000 IJ SOLN
20.0000 mL | Freq: Once | INTRAMUSCULAR | Status: AC
  Administered 2020-06-22: 20 mL
  Filled 2020-06-22: qty 1

## 2020-06-22 MED ORDER — FENTANYL CITRATE (PF) 100 MCG/2ML IJ SOLN
25.0000 ug | Freq: Once | INTRAMUSCULAR | Status: AC
  Administered 2020-06-22: 25 ug via INTRAVENOUS
  Filled 2020-06-22: qty 2

## 2020-06-22 MED ORDER — SODIUM CHLORIDE 0.9% IV SOLUTION
Freq: Once | INTRAVENOUS | Status: DC
  Filled 2020-06-22: qty 250

## 2020-06-22 MED ORDER — DUTASTERIDE 0.5 MG PO CAPS
0.5000 mg | ORAL_CAPSULE | Freq: Every day | ORAL | Status: DC
  Administered 2020-06-23 – 2020-07-02 (×9): 0.5 mg via ORAL
  Filled 2020-06-22 (×11): qty 1

## 2020-06-22 MED ORDER — ROSUVASTATIN CALCIUM 10 MG PO TABS
10.0000 mg | ORAL_TABLET | Freq: Every day | ORAL | Status: DC
  Administered 2020-06-23 – 2020-07-02 (×9): 10 mg via ORAL
  Filled 2020-06-22 (×10): qty 1

## 2020-06-22 MED ORDER — DEXTROSE 50 % IV SOLN
1.0000 | Freq: Once | INTRAVENOUS | Status: AC
  Administered 2020-06-23: 50 mL via INTRAVENOUS
  Filled 2020-06-22: qty 50

## 2020-06-22 MED ORDER — PANTOPRAZOLE SODIUM 20 MG PO TBEC
20.0000 mg | DELAYED_RELEASE_TABLET | Freq: Two times a day (BID) | ORAL | Status: DC
  Administered 2020-06-23 – 2020-07-02 (×19): 20 mg via ORAL
  Filled 2020-06-22 (×22): qty 1

## 2020-06-22 MED ORDER — LORATADINE 10 MG PO TABS
10.0000 mg | ORAL_TABLET | Freq: Every day | ORAL | Status: DC
  Administered 2020-06-23 – 2020-07-02 (×9): 10 mg via ORAL
  Filled 2020-06-22 (×9): qty 1

## 2020-06-22 MED ORDER — MONTELUKAST SODIUM 10 MG PO TABS
10.0000 mg | ORAL_TABLET | Freq: Every day | ORAL | Status: DC
  Administered 2020-06-23 – 2020-07-02 (×9): 10 mg via ORAL
  Filled 2020-06-22 (×10): qty 1

## 2020-06-22 NOTE — ED Notes (Signed)
Awaiting med verification

## 2020-06-22 NOTE — ED Notes (Signed)
PT remains hypotensive, Dr. Charlsie Quest at bedside to assist with IV placement.  US guided IV established, NS infusing.  Pt is restless and reports back pain.  Pt on cardiac monitor, will continue to monitor closely.

## 2020-06-22 NOTE — Progress Notes (Signed)
Patient ID: Dwayne Thompson, male   DOB: Feb 06, 1946, 74 y.o.   MRN: [id number]  Chief Complaint  Patient presents with  . New Patient (Initial Visit)    Pleural effusion    Referred By Dr. Rosanna Randy Reason for Referral recurrent left pleural effusion  HPI Location, Quality, Duration, Severity, Timing, Context, Modifying Factors, Associated Signs and Symptoms.  Dwayne Thompson is a 74 y.o. male.  This is a 74 year old gentleman who comes in today with his wife.  He states that he has been having a recurrent left pleural effusion over the last several weeks.  He is undergone 2 thoracentesis of about 2 L each time in early in mid September.  He states that he did feel significant relief the first time but not necessarily the second time.  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  The patient states that he has been scheduled for a biopsy at [facility].  I believe this is a pleural biopsy.  However that is not been scheduled through our office so I am unclear of exactly what that may be.  He was tested for Covid and was negative.  He states that over the last few days he has become increasingly short of breath to the point where he is very symptomatic even at rest.  He is unable to lie back at all.  He must sit or stand to obtain any significant relief.  Because of his schedule biopsy they did not come to the emergency room yesterday but when he came today he was quite hypotensive with blood pressures in the 70s and oxygen saturations in the high 80s.  He was placed on oxygen therapy and EMS was called.  They are here examining the patient now will transport him over to the emergency department.   Past Medical History:  Diagnosis Date  . Anemia   . Back pain   . BPH (benign prostatic hyperplasia)   . Cirrhosis ([place])   . COPD (chronic obstructive pulmonary disease) ([place])   . ED (erectile dysfunction)   . GERD (gastroesophageal reflux  disease)   . Hyperlipidemia   . Hypertension     Past Surgical History:  Procedure Laterality Date  . COLONOSCOPY Left 01/03/2018   Procedure: COLONOSCOPY;  Surgeon: Virgel Manifold, MD;  Location: [facility] ENDOSCOPY;  Service: Endoscopy;  Laterality: Left;  . COLONOSCOPY WITH PROPOFOL N/A 04/05/2018   Procedure: COLONOSCOPY WITH PROPOFOL;  Surgeon: Virgel Manifold, MD;  Location: [facility] ENDOSCOPY;  Service: Endoscopy;  Laterality: N/A;  . CYSTOSCOPY WITH STENT PLACEMENT Right 09/26/2019   Procedure: CYSTOSCOPY WITH STENT PLACEMENT;  Surgeon: Royston Cowper, MD;  Location: [facility] ORS;  Service: Urology;  Laterality: Right;  . ENTEROSCOPY  01/03/2018   Procedure: ENTEROSCOPY;  Surgeon: Virgel Manifold, MD;  Location: [facility] ENDOSCOPY;  Service: Endoscopy;;  . ESOPHAGOGASTRODUODENOSCOPY N/A 01/02/2018   Procedure: ESOPHAGOGASTRODUODENOSCOPY (EGD);  Surgeon: Virgel Manifold, MD;  Location: [facility] ENDOSCOPY;  Service: Endoscopy;  Laterality: N/A;  . ESOPHAGOGASTRODUODENOSCOPY (EGD) WITH PROPOFOL N/A 04/05/2018   Procedure: ESOPHAGOGASTRODUODENOSCOPY (EGD) WITH PROPOFOL;  Surgeon: Virgel Manifold, MD;  Location: [facility] ENDOSCOPY;  Service: Endoscopy;  Laterality: N/A;  . exploration and neurolysis of right peroneal nerve at the knee Right 08/27/2018  . GREEN LIGHT LASER TURP (TRANSURETHRAL RESECTION OF PROSTATE N/A 09/26/2019   Procedure: GREEN LIGHT LASER TURP (TRANSURETHRAL RESECTION OF PROSTATE;  Surgeon: Royston Cowper, MD;  Location: [facility] ORS;  Service: Urology;  Laterality: N/A;  . HERNIA REPAIR     1980's    Family History  Problem Relation Age of Onset  . Alzheimer's disease Mother   . Sarcoidosis Sister   . Anxiety disorder Brother   . Kidney disease Neg Hx   . Prostate cancer Neg Hx     Social History Social History   Tobacco Use  . Smoking status: Former Smoker    Packs/day: 1.00    Years: 50.00    Pack years: 50.00    Types: Cigarettes    Quit date: 10/20/2013     Years since quitting: 6.6  . Smokeless tobacco: Never Used  Vaping Use  . Vaping Use: Never used  Substance Use Topics  . Alcohol use: Yes    Alcohol/week: 21.0 - 28.0 standard drinks    Types: 21 - 28 Shots of liquor per week    Comment: 3-4 gin and sodas a day  . Drug use: No    Allergies  Allergen Reactions  . Indomethacin Swelling    ankle swelling  . Tamsulosin Hcl Hives  . Sulfa Antibiotics Rash    Current Outpatient Medications  Medication Sig Dispense Refill  . acetaminophen (TYLENOL) 325 MG tablet Take 2 tablets (650 mg total) by mouth 3 (three) times daily. (Patient taking differently: Take 650 mg by mouth every 6 (six) hours as needed for moderate pain. ) 180 tablet 0  . AMBULATORY NON FORMULARY MEDICATION Trimix (30 mg PAPA, 1 mL phen, 50 mg prostaglandin)   Vial 66ml   Dose 0.14mcg  Qty 10 #6 refills  [facility] [phone] Fax [phone] 10 vial 6  . aspirin 81 MG chewable tablet Chew by mouth daily.    . cetirizine (ZYRTEC) 10 MG tablet Take 1 tablet (10 mg total) by mouth daily. 90 tablet 4  . cholecalciferol (VITAMIN D3) 25 MCG (1000 UNIT) tablet Take 1,000 Units by mouth daily.    . diclofenac Sodium (VOLTAREN) 1 % GEL Apply 1 application topically 4 (four) times daily as needed (pain).    Marland Kitchen dutasteride (AVODART) 0.5 MG capsule Take 0.5 mg by mouth daily.    . folic acid (FOLVITE) 1 MG tablet Take 1 tablet (1 mg total) by mouth daily. 30 tablet 0  . HYDROcodone-acetaminophen (NORCO) 10-325 MG tablet Take 1 tablet by mouth every 4 (four) hours as needed. 55 tablet 0  . montelukast (SINGULAIR) 10 MG tablet TAKE 1 TABLET BY MOUTH EVERY DAY (Patient taking differently: Take 10 mg by mouth daily. ) 90 tablet 3  . oxyCODONE (ROXICODONE) 5 MG immediate release tablet Take 1 tablet (5 mg total) by mouth every 4 (four) hours as needed for severe pain. 55 tablet 0  . pantoprazole (PROTONIX) 20 MG tablet Take 20 mg by mouth 2 (two) times daily.    .  potassium chloride (K-DUR,KLOR-CON) 10 MEQ tablet Take 1 tablet (10 mEq total) by mouth 2 (two) times daily. 180 tablet 3  . rosuvastatin (CRESTOR) 10 MG tablet Take 1 tablet (10 mg total) by mouth daily. 90 tablet 3  . tamsulosin (FLOMAX) 0.4 MG CAPS capsule Take 0.4 mg by mouth daily.    . vitamin B-12 1000 MCG tablet Take 1 tablet (1,000 mcg total) by mouth daily. 180 tablet 1  . doxazosin (CARDURA) 2 MG tablet Take 1 tablet (2 mg total) by mouth daily. (Patient not taking: Reported on 06/18/2020) 30 tablet 0   No current facility-administered medications for this visit.  Review of Systems A complete review of systems was asked and was negative except for the following positive findings severe shortness of breath and chest pain.  Significant lower extremity edema.  Blood pressure (!) 75/43, pulse (!) 115, temperature 97.9 F (36.6 C), temperature source Oral, resp. rate 16, height 5' 11.5" (1.816 m), weight 195 lb (88.5 kg), SpO2 (!) 89 %.  Physical Exam CONSTITUTIONAL:  Pleasant, well-developed, well-nourished, and in no acute distress. EYES: Pupils equal and reactive to light, Sclera non-icteric EARS, NOSE, MOUTH AND THROAT:  The oropharynx was clear.  Dentition is good repair.  Oral mucosa pink and moist. LYMPH NODES:  Lymph nodes in the neck and axillae were normal RESPIRATORY:  Lungs were clear on the right with some rales at the right base.  There were no breath sounds on the left..  Moderate respiratory discomfort effort without pathologic use of accessory muscles of respiration CARDIOVASCULAR: Heart was regular without murmurs.  There were no carotid bruits. GI: The abdomen was soft, nontender, and distended. There were no palpable masses. There was no hepatosplenomegaly. There were normal bowel sounds in all quadrants. GU:  Rectal deferred.   MUSCULOSKELETAL:  Normal muscle strength and tone.  No clubbing or cyanosis.  There is extensive bilateral lower extremity  edema. SKIN:  There were no pathologic skin lesions.  There were no nodules on palpation. NEUROLOGIC:  Sensation is normal.  Cranial nerves are grossly intact. PSYCH:  Oriented to person, place and time.  Mood and affect are normal.  Data Reviewed CT scans and chest x-rays  I have personally reviewed the patient's imaging, laboratory findings and medical records.    Assessment    Recurrent left pleural effusion.  Given the recent results of the PET scan I suspect either metastatic bronchogenic carcinoma or mesothelioma.    Plan    The patient is in obvious distress in our clinic.  We will have the EMS team transport him to the ER.  Once there he should be extensively evaluated by our pulmonary medicine and oncology colleagues.  We will also facilitate his work-up.       [name], MD 06/22/2020, 10:30 AM

## 2020-06-22 NOTE — ED Triage Notes (Signed)
Pt in via EMS from [facility]. Pt went for consultation for pleural effusion. Pt was SOB, tachycardiac and hypotensive, 75/45, Sats 93% on 2L. Pt reports over the last couple of days he has not been able to lay flat. Pt also with edema to BLE.

## 2020-06-22 NOTE — ED Notes (Signed)
Chest tube clamped at this time. Per Dr. Charlsie Quest.  Pt has drained over 2L at this time.  Will continue to monitor him closely.

## 2020-06-22 NOTE — ED Provider Notes (Addendum)
[facility] Emergency Department Provider Note ____________________________________________   First MD Initiated Contact with Patient 06/22/20 1146     (approximate)  I have reviewed the triage vital signs and the nursing notes.  HISTORY  Chief Complaint Shortness of Breath and Chest Pain   HPI Dwayne Thompson is a 74 y.o. malewho presents to the ED for evaluation of shortness of breath and chest pain.   Chart review indicates history of mesothelioma followed by pulmonology.  Patient has had 2 previous thoracentesis to the left side due to pleural effusions recurrently.  Last was about 1 month ago.  Patient and wife report about 2 weeks of progressively worsening shortness of breath, dyspnea and constant chest pain.  They report an aching chest pain, rating around his left side to his back, up to 8/10 intensity, constant.  Patient was seen by Dr. Genevive Bi this morning, noted to be tachycardic and hypotensive in the office, and was transferred to the ED for further evaluation. Patient hypotensive in triage, but normotensive on his first pressure in the room. Next pressure is hypotensive, and I then placed ultrasound-guided IV to his left upper arm and initiate fluid bolus, he is responsive to this with improving pressures.  History somewhat limited due to acuity of condition.   Past Medical History:  Diagnosis Date  . Anemia   . Back pain   . BPH (benign prostatic hyperplasia)   . Cirrhosis ([place])   . COPD (chronic obstructive pulmonary disease) ([place])   . ED (erectile dysfunction)   . GERD (gastroesophageal reflux disease)   . Hyperlipidemia   . Hypertension     Patient Active Problem List   Diagnosis Date Noted  . Acute respiratory failure ([place]) 06/22/2020  . Left flank pain 04/23/2020  . Essential hypertension 03/12/2019  . Thoracic aortic aneurysm ([place]) 03/12/2019  . Abnormal chest CT 02/13/2019  . Difficulty walking 12/06/2018  . Muscle weakness  12/06/2018  . Cellulitis of lower leg 09/17/2018  . CLE (columnar lined esophagus)   . Special screening for malignant neoplasms, colon   . Benign neoplasm of ascending colon   . Polyp of sigmoid colon   . Diverticulosis of large intestine without diverticulitis   . Iron deficiency anemia due to chronic blood loss 02/18/2018  . Folate deficiency 02/14/2018  . B12 deficiency 01/10/2018  . Folic acid deficiency 65/46/5035  . 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 Helicobacter pylori gastritis (chronic gastritis) 01/10/2018  . Thrombocytopenia ([place]) 01/10/2018  . Internal hemorrhoids   . Acute gastrointestinal hemorrhage   . Stomach irritation   . Duodenal nodule   . Melena 01/01/2018  . Neck pain on right side 03/23/2016  . History of tobacco abuse 03/23/2016  . Cervical lymphadenitis 03/23/2016  . 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 Allergic rhinitis 03/28/2015  . 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 Failure of erection 03/28/2015  . Acid reflux 03/28/2015  . Arthritis, degenerative 03/28/2015  . Change in blood platelet count 03/28/2015  . Temporary cerebral vascular dysfunction 03/28/2015  . Personal history of tobacco use, presenting hazards to health 03/28/2015    Past Surgical History:  Procedure Laterality Date  . COLONOSCOPY Left 01/03/2018   Procedure: COLONOSCOPY;  Surgeon: Virgel Manifold, MD;  Location[facility] ENDOSCOPY;  Service: Endoscopy;  Laterality: Left;  . COLONOSCOPY WITH PROPOFOL N/A 04/05/2018  Procedure: COLONOSCOPY WITH PROPOFOL;  Surgeon: Virgel Manifold, MD;  Location: [facility] ENDOSCOPY;  Service: Endoscopy;  Laterality: N/A;  . CYSTOSCOPY WITH STENT PLACEMENT Right 09/26/2019   Procedure: CYSTOSCOPY WITH STENT PLACEMENT;  Surgeon: Royston Cowper, MD;  Location: [facility] ORS;   Service: Urology;  Laterality: Right;  . ENTEROSCOPY  01/03/2018   Procedure: ENTEROSCOPY;  Surgeon: Virgel Manifold, MD;  Location: [facility] ENDOSCOPY;  Service: Endoscopy;;  . ESOPHAGOGASTRODUODENOSCOPY N/A 01/02/2018   Procedure: ESOPHAGOGASTRODUODENOSCOPY (EGD);  Surgeon: Virgel Manifold, MD;  [facility];  Service: Endoscopy;  Laterality: N/A;  . ESOPHAGOGASTRODUODENOSCOPY (EGD) WITH PROPOFOL N/A 04/05/2018   Procedure: ESOPHAGOGASTRODUODENOSCOPY (EGD) WITH PROPOFOL;  Surgeon: Virgel Manifold, MD;  Location: [facility] ENDOSCOPY;  Service: Endoscopy;  Laterality: N/A;  . exploration and neurolysis of right peroneal nerve at the knee Right 08/27/2018  . GREEN LIGHT LASER TURP (TRANSURETHRAL RESECTION OF PROSTATE N/A 09/26/2019   Procedure: GREEN LIGHT LASER TURP (TRANSURETHRAL RESECTION OF PROSTATE;  Surgeon: Royston Cowper, MD;  Location: [facility] ORS;  Service: Urology;  Laterality: N/A;  . HERNIA REPAIR     1980's    Prior to Admission medications   Medication Sig Start Date End Date Taking? Authorizing Provider  acetaminophen (TYLENOL) 325 MG tablet Take 2 tablets (650 mg total) by mouth 3 (three) times daily. Patient taking differently: Take 650 mg by mouth every 6 (six) hours as needed for moderate pain.  01/04/18   Salary, Avel Peace, MD  AMBULATORY NON FORMULARY MEDICATION Trimix (30 mg PAPA, 1 mL phen, 50 mg prostaglandin)   Vial 10ml   Dose 0.32mcg  Qty 10 #6 refills  [place] [phone] Fax [phone] 07/19/19   [name] A, PA-C  aspirin 81 MG chewable tablet Chew by mouth daily.    [provider]  cetirizine (ZYRTEC) 10 MG tablet Take 1 tablet (10 mg total) by mouth daily. 01/30/20   Jerrol Banana., MD  cholecalciferol (VITAMIN D3) 25 MCG (1000 UNIT) tablet Take 1,000 Units by mouth daily.    [provider]  diclofenac Sodium (VOLTAREN) 1 % GEL Apply 1 application topically 4 (four) times daily as needed  (pain).    [provider]  doxazosin (CARDURA) 2 MG tablet Take 1 tablet (2 mg total) by mouth daily. Patient not taking: Reported on 06/18/2020 04/24/20 06/18/20  Trinna Post, PA-C  dutasteride (AVODART) 0.5 MG capsule Take 0.5 mg by mouth daily.    [provider]  folic acid (FOLVITE) 1 MG tablet Take 1 tablet (1 mg total) by mouth daily. 12/15/18   Lequita Asal, MD  HYDROcodone-acetaminophen (NORCO) 10-325 MG tablet Take 1 tablet by mouth every 4 (four) hours as needed. 06/17/20   Jerrol Banana., MD  montelukast (SINGULAIR) 10 MG tablet TAKE 1 TABLET BY MOUTH EVERY DAY Patient taking differently: Take 10 mg by mouth daily.  05/12/20   Jerrol Banana., MD  oxyCODONE (ROXICODONE) 5 MG immediate release tablet Take 1 tablet (5 mg total) by mouth every 4 (four) hours as needed for severe pain. 06/18/20   Jerrol Banana., MD  pantoprazole (PROTONIX) 20 MG tablet Take 20 mg by mouth 2 (two) times daily.    [provider]  potassium chloride (K-DUR,KLOR-CON) 10 MEQ tablet Take 1 tablet (10 mEq total) by mouth 2 (two) times daily. 12/21/18   Jerrol Banana., MD  rosuvastatin (CRESTOR) 10 MG tablet Take 1 tablet (10 mg total) by  mouth daily. 10/28/19   Jerrol Banana., MD  tamsulosin (FLOMAX) 0.4 MG CAPS capsule Take 0.4 mg by mouth daily.    [provider]  vitamin B-12 1000 MCG tablet Take 1 tablet (1,000 mcg total) by mouth daily. 01/05/18   Salary, Avel Peace, MD    Allergies Indomethacin, Tamsulosin hcl, and Sulfa antibiotics  Family History  Problem Relation Age of Onset  . Alzheimer's disease Mother   . Sarcoidosis Sister   . Anxiety disorder Brother   . Kidney disease Neg Hx   . Prostate cancer Neg Hx     Social History Social History   Tobacco Use  . Smoking status: Former Smoker    Packs/day: 1.00    Years: 50.00    Pack years: 50.00    Types: Cigarettes    Quit date: 10/20/2013    Years since quitting:  6.6  . Smokeless tobacco: Never Used  Vaping Use  . Vaping Use: Never used  Substance Use Topics  . Alcohol use: Yes    Alcohol/week: 21.0 - 28.0 standard drinks    Types: 21 - 28 Shots of liquor per week    Comment: 3-4 gin and sodas a day  . Drug use: No    Review of Systems  Constitutional: No fever/chills Eyes: No visual changes. ENT: No sore throat. Cardiovascular: Positive chest pain Respiratory: Positive for shortness of breath. Gastrointestinal: No abdominal pain.  No nausea, no vomiting.  No diarrhea.  No constipation. Genitourinary: Negative for dysuria. Musculoskeletal: Negative for back pain. Skin: Negative for rash. Neurological: Negative for headaches, focal weakness or numbness.   ____________________________________________   PHYSICAL EXAM:  VITAL SIGNS: Vitals:   06/22/20 1330 06/22/20 1400  BP: (!) 93/46 (!) 86/49  Pulse: (!) 116 89  Resp: (!) 30 18  Temp:    SpO2: (!) 85% (!) 71%      Constitutional: Alert and oriented.  Uncomfortable-appearing.  Tachypneic to the low 30s.  Speaking in short phrases only. Eyes: Conjunctivae are normal. PERRL. EOMI. Head: Atraumatic. Nose: No congestion/rhinnorhea. Mouth/Throat: Mucous membranes are moist.  Oropharynx non-erythematous. Neck: No stridor. No cervical spine tenderness to palpation.  No tracheal deviation identified. Cardiovascular: Tachycardic rate, regular rhythm. Grossly normal heart sounds.  Good peripheral circulation. Respiratory: Tachypneic to the low 30s.  Absent breath sounds throughout the left hemithorax.  Clear on the right. Gastrointestinal: Soft , nondistended, nontender to palpation. No abdominal bruits. No CVA tenderness. Musculoskeletal: No lower extremity tenderness nor edema.  No joint effusions. No signs of acute trauma. Neurologic:   No gross focal neurologic deficits are appreciated. No gait instability noted. Skin:  Skin is warm, dry and intact. No rash noted. Psychiatric:  Mood and affect are normal. Speech and behavior are normal.  ____________________________________________   LABS (all labs ordered are listed, but only abnormal results are displayed)  Labs Reviewed  BASIC METABOLIC PANEL - Abnormal; Notable for the following components:      Result Value   Sodium 131 (*)    Potassium 5.5 (*)    CO2 21 (*)    Glucose, Bld 106 (*)    BUN 70 (*)    Creatinine, Ser 2.75 (*)    GFR calc non Af Amer 22 (*)    GFR calc Af Amer 25 (*)    All other components within normal limits  CBC - Abnormal; Notable for the following components:   RBC 3.20 (*)    Hemoglobin 9.8 (*)    HCT  29.1 (*)    All other components within normal limits  APTT  PROTIME-INR  HEMOGLOBIN AND HEMATOCRIT, BLOOD  TYPE AND SCREEN  TYPE AND SCREEN  TROPONIN I (HIGH SENSITIVITY)  TROPONIN I (HIGH SENSITIVITY)   ____________________________________________  12 Lead EKG  Sinus rhythm, 105 bpm, normal axis and intervals.  No evidence of acute ischemia.  Sinus tachycardia. ____________________________________________  RADIOLOGY  ED MD interpretation: 2 view CXR with complete opacification of the left hemothorax  Repeat 1 view CXR after chest tube placement with improvement of left lung aeration, small residual effusion.  Official radiology report(s): DG Chest 2 View  Result Date: 06/22/2020 CLINICAL DATA:  Shortness of breath.  Hypotensive. EXAM: CHEST - 2 VIEW COMPARISON:  06/17/2020 FINDINGS: Complete opacification of the left lung with rightward shift of the mediastinum. Linear opacities in the right lung base. No visible pneumothorax. Cardiomediastinal silhouette is largely obscured. No acute osseous abnormality. IMPRESSION: 1. Complete opacification of the left lung, likely related to a large pleural effusion (increased from prior) and possibly overlying consolidation. There is rightward mediastinal shift. Correlate for evidence of tension physiology. 2. Linear opacities in  the right lung base, which could represent atelectasis, aspiration, and/or pneumonia. Electronically Signed   By: Margaretha Sheffield MD   On: 06/22/2020 11:45   DG Chest Portable 1 View  Result Date: 06/22/2020 CLINICAL DATA:  Chest tube placement EXAM: PORTABLE CHEST 1 VIEW COMPARISON:  June 22, 2020 study obtained earlier in the day FINDINGS: Chest tube present on the left with considerable diminution in size of left pleural effusion. There is a fairly small residual left pleural effusion with patchy atelectasis in the left mid and lower lung regions. Lungs elsewhere are clear. Heart is upper normal in size with pulmonary vascularity normal. No adenopathy. No pneumothorax. No bone lesions. IMPRESSION: Left pleural effusion considerably smaller after chest tube placement. There is a fairly small persistent left pleural effusion with atelectatic change in the left mid and lower lung regions. Lungs otherwise clear. Heart upper normal in size. Electronically Signed   By: Lowella Grip III M.D.   On: 06/22/2020 13:49    ____________________________________________   PROCEDURES and INTERVENTIONS  Procedure(s) performed (including Critical Care):  .1-3 Lead EKG Interpretation Performed by: Vladimir Crofts, MD Authorized by: Vladimir Crofts, MD     Interpretation: abnormal     ECG rate:  110   ECG rate assessment: tachycardic     Rhythm: sinus tachycardia     Ectopy: none     Conduction: normal   .Critical Care Performed by: Vladimir Crofts, MD Authorized by: Vladimir Crofts, MD   Critical care provider statement:    Critical care time (minutes):  45   Critical care was necessary to treat or prevent imminent or life-threatening deterioration of the following conditions:  Circulatory failure, dehydration, respiratory failure, renal failure and shock   Critical care was time spent personally by me on the following activities:  Discussions with consultants, evaluation of patient's response to  treatment, examination of patient, ordering and performing treatments and interventions, ordering and review of laboratory studies, ordering and review of radiographic studies, pulse oximetry, re-evaluation of patient's condition, obtaining history from patient or surrogate and review of old charts CHEST TUBE INSERTION  Date/Time: 06/22/2020 2:39 PM Performed by: Vladimir Crofts, MD Authorized by: Vladimir Crofts, MD   Consent:    Consent obtained:  Written   Consent given by:  Patient and spouse   Risks discussed:  Bleeding, incomplete drainage, pain, infection  and damage to surrounding structures   Alternatives discussed:  Delayed treatment and no treatment Pre-procedure details:    Skin preparation:  ChloraPrep   Preparation: Patient was prepped and draped in the usual sterile fashion   Sedation:    Sedation type:  Anxiolysis Anesthesia (see MAR for exact dosages):    Anesthesia method:  Local infiltration   Local anesthetic:  Lidocaine 1% WITH epi Procedure details:    Placement location:  L lateral   Tube size (French): 14.   Ultrasound guidance: yes     Tension pneumothorax: no     Tube connected to:  Suction   Drainage characteristics:  Bloody   Suture material:  2-0 silk   Dressing:  4x4 sterile gauze (tegaderm) Comments:     14 French chest tube placed via Seldinger technique to the left anterior axillary line Ultrasound ED Peripheral IV (Provider)  Date/Time: 06/22/2020 3:01 PM Performed by: Vladimir Crofts, MD Authorized by: Vladimir Crofts, MD   Procedure details:    Indications: hypotension and poor IV access     Skin Prep: chlorhexidine gluconate     Location: left basilic v.   Angiocath:  20 G   Bedside Ultrasound Guided: Yes     Images: not archived     Patient tolerated procedure without complications: Yes     Dressing applied: Yes      Medications  lidocaine-EPINEPHrine (XYLOCAINE W/EPI) 2 %-1:100000 (with pres) injection 20 mL (20 mLs Other Given 06/22/20 1320)    fentaNYL (SUBLIMAZE) injection 25 mcg (25 mcg Intravenous Given 06/22/20 1320)    ____________________________________________   MDM / ED COURSE  74 year old male on ASA 84, with known mesothelioma, presents to the ED with progressively worsening shortness of breath, hypotension and hypoxia from CT surgery clinic, with evidence of large left-sided hemothorax requiring drainage and admission.  Patient initially hypotensive, responsive to fluids, but also with a new O2 requirement for nasal cannula.  Exam with absent breath sounds on the left, tachypnea and dyspnea concerning for pleural effusion.  CXR confirms this with total opacification of the left hemithorax without tension physiology.  EKG is nonischemic.  Blood work with a hemoglobin drop of 3 points and prerenal AKI.  Due to his distress, hypoxia and hypotension, chest tube placed, as above, without complication.  High output of blood, about 2 L within the first hour, and so chest tube was clamped thereafter.  Dr. Faith Rogue with CT surgery see the patient in the ED and recommends hospitalist admission, and he will follow along.  Patient is agreeable to blood transfusion, if needed, and while he is close, he has no indications at the time of this writing for emergent transfusion.  We will recheck H&H, sent type and screen and continue to assess the patient.  Admitted to hospitalist medicine for further work-up and management.  Clinical Course as of Jun 23 1455  Mon Jun 22, 2020  1348 Spoke with Dr. Faith Rogue who will come evaluate the patient in person.   [DS]    Clinical Course User Index [DS] Vladimir Crofts, MD     ____________________________________________   FINAL CLINICAL IMPRESSION(S) / ED DIAGNOSES  Final diagnoses:  Hemothorax  Other chest pain  Shortness of breath  Hypoxia  AKI (acute kidney injury) [facility])     ED Discharge Orders    None       Shiryl Ruddy   Note:  This document was prepared using Dragon voice  recognition software and may include unintentional dictation errors.  Vladimir Crofts, MD 06/22/20 1500    Vladimir Crofts, MD 06/22/20 1501

## 2020-06-22 NOTE — H&P (Addendum)
History and Physical    Dwayne Thompson ZOX:[id number] DOB: 20-Nov-1945 DOA: 06/22/2020  PCP: Jerrol Banana., MD   Patient coming from: Home  I have personally briefly reviewed patient's old medical records in [place]  Chief Complaint: Shortness of breath  HPI: Dwayne Thompson is a 74 y.o. male with medical history significant for liver cirrhosis, BPH, COPD, hypertension and chronic low back pain who was presented to the ER from the cardiothoracic surgeons office for evaluation of hypotension and tachycardia.  Patient has a history of mesothelioma as well as recurrent left pleural effusions and is status post 2 prior thoracentesis with drainage of serosanguineous fluid.  He was scheduled to see the cardiothoracic surgeon today but states that he developed worsening shortness of breath about 2 days prior to presentation.  He did not come to the emergency room because he wanted to keep his appointment with the CT surgeon for possible biopsy.  When he arrived at the office patient was noted to be hypotensive and tachycardic.  He was also hypoxic with room air pulse oximetry in the 80s.  Patient was placed on oxygen and was transported to the ER by EMS. He has had chest pain mostly over the left anterior chest wall but denies having any nausea, no vomiting, no diaphoresis or palpitations. He denies having any abdominal pain, no changes in his bowel habits, no fever, no chills, no cough, no dizziness or lightheadedness. Labs show sodium 131, potassium 5.5, chloride 100, bicarb 21, glucose 106, BUN 70 compared to 12 about a month ago, creatinine 2.75 compared to 0.85 about a month ago, calcium 8.9, white count 9.8, hemoglobin 9.8, hematocrit 29.1, MCV 90.9, RDW 12.5, platelet count 220 Chest x-ray reviewed by me shows a left pleural effusion.  Otherwise clear lungs Twelve-lead EKG reviewed by me shows sinus tachycardia   ED Course: Patient is a 74 year old African-American male who was  referred to the emergency room by the cardiothoracic surgeon for evaluation of hypotension and hypoxia.  Patient had pulse oximetry in the high 80s during his appointment and required oxygen supplementation to maintain pulse oximetry greater than 92%.  He was also hypotensive and responded to IV fluid resuscitation.  Patient had a chest x-ray which showed a left pleural effusion and status post pigtail insertion with drainage of frank blood.  Patient has had about 2.5 L of bloody drainage from the pigtail.  He continues to require oxygen at 4 L to maintain pulse oximetry greater than 92% and will be admitted to the hospital for further evaluation.  Review of Systems: As per HPI otherwise 10 point review of systems negative.    Past Medical History:  Diagnosis Date  . Anemia   . Back pain   . BPH (benign prostatic hyperplasia)   . Cirrhosis ([place])   . COPD (chronic obstructive pulmonary disease) ([place])   . ED (erectile dysfunction)   . GERD (gastroesophageal reflux disease)   . Hyperlipidemia   . Hypertension     Past Surgical History:  Procedure Laterality Date  . COLONOSCOPY Left 01/03/2018   Procedure: COLONOSCOPY;  Surgeon: Virgel Manifold, MD;  Location: [facility] ENDOSCOPY;  Service: Endoscopy;  Laterality: Left;  . COLONOSCOPY WITH PROPOFOL N/A 04/05/2018   Procedure: COLONOSCOPY WITH PROPOFOL;  Surgeon: Virgel Manifold, MD;  Location: [facility] ENDOSCOPY;  Service: Endoscopy;  Laterality: N/A;  . CYSTOSCOPY WITH STENT PLACEMENT Right 09/26/2019   Procedure: CYSTOSCOPY WITH STENT PLACEMENT;  Surgeon: Royston Cowper, MD;  Location: [facility] ORS;  Service: Urology;  Laterality: Right;  . ENTEROSCOPY  01/03/2018   Procedure: ENTEROSCOPY;  Surgeon: Virgel Manifold, MD;  Location: [facility] ENDOSCOPY;  Service: Endoscopy;;  . ESOPHAGOGASTRODUODENOSCOPY N/A 01/02/2018   Procedure: ESOPHAGOGASTRODUODENOSCOPY (EGD);  Surgeon: Virgel Manifold, MD;  Location: [facility] ENDOSCOPY;  Service: Endoscopy;   Laterality: N/A;  . ESOPHAGOGASTRODUODENOSCOPY (EGD) WITH PROPOFOL N/A 04/05/2018   Procedure: ESOPHAGOGASTRODUODENOSCOPY (EGD) WITH PROPOFOL;  Surgeon: Virgel Manifold, MD;  Location: [facility] ENDOSCOPY;  Service: Endoscopy;  Laterality: N/A;  . exploration and neurolysis of right peroneal nerve at the knee Right 08/27/2018  . GREEN LIGHT LASER TURP (TRANSURETHRAL RESECTION OF PROSTATE N/A 09/26/2019   Procedure: GREEN LIGHT LASER TURP (TRANSURETHRAL RESECTION OF PROSTATE;  Surgeon: Royston Cowper, MD;  Location: [facility] ORS;  Service: Urology;  Laterality: N/A;  . HERNIA REPAIR     1980's     reports that he quit smoking about 6 years ago. His smoking use included cigarettes. He has a 50.00 pack-year smoking history. He has never used smokeless tobacco. He reports current alcohol use of about 21.0 - 28.0 standard drinks of alcohol per week. He reports that he does not use drugs.  Allergies  Allergen Reactions  . Indomethacin Swelling    ankle swelling  . Tamsulosin Hcl Hives  . Sulfa Antibiotics Rash    Family History  Problem Relation Age of Onset  . Alzheimer's disease Mother   . Sarcoidosis Sister   . Anxiety disorder Brother   . Kidney disease Neg Hx   . Prostate cancer Neg Hx      Prior to Admission medications   Medication Sig Start Date End Date Taking? Authorizing Provider  acetaminophen (TYLENOL) 325 MG tablet Take 2 tablets (650 mg total) by mouth 3 (three) times daily. Patient taking differently: Take 650 mg by mouth every 6 (six) hours as needed for moderate pain.  01/04/18   Salary, Avel Peace, MD  AMBULATORY NON FORMULARY MEDICATION Trimix (30 mg PAPA, 1 mL phen, 50 mg prostaglandin)   Vial 45ml   Dose 0.24mcg  Qty 10 #6 refills  [place] [phone] Fax [phone] 07/19/19   [name] A, PA-C  aspirin 81 MG chewable tablet Chew by mouth daily.    [provider]  cetirizine (ZYRTEC) 10 MG tablet Take 1 tablet (10 mg total) by  mouth daily. 01/30/20   Jerrol Banana., MD  cholecalciferol (VITAMIN D3) 25 MCG (1000 UNIT) tablet Take 1,000 Units by mouth daily.    [provider]  diclofenac Sodium (VOLTAREN) 1 % GEL Apply 1 application topically 4 (four) times daily as needed (pain).    [provider]  doxazosin (CARDURA) 2 MG tablet Take 1 tablet (2 mg total) by mouth daily. Patient not taking: Reported on 06/18/2020 04/24/20 06/18/20  Trinna Post, PA-C  dutasteride (AVODART) 0.5 MG capsule Take 0.5 mg by mouth daily.    [provider]  folic acid (FOLVITE) 1 MG tablet Take 1 tablet (1 mg total) by mouth daily. 12/15/18   Lequita Asal, MD  HYDROcodone-acetaminophen (NORCO) 10-325 MG tablet Take 1 tablet by mouth every 4 (four) hours as needed. 06/17/20   Jerrol Banana., MD  montelukast (SINGULAIR) 10 MG tablet TAKE 1 TABLET BY MOUTH EVERY DAY Patient taking differently: Take 10 mg by mouth daily.  05/12/20   Jerrol Banana., MD  oxyCODONE (ROXICODONE) 5 MG immediate release tablet Take 1 tablet (5 mg total)  by mouth every 4 (four) hours as needed for severe pain. 06/18/20   Jerrol Banana., MD  pantoprazole (PROTONIX) 20 MG tablet Take 20 mg by mouth 2 (two) times daily.    [provider]  potassium chloride (K-DUR,KLOR-CON) 10 MEQ tablet Take 1 tablet (10 mEq total) by mouth 2 (two) times daily. 12/21/18   Jerrol Banana., MD  rosuvastatin (CRESTOR) 10 MG tablet Take 1 tablet (10 mg total) by mouth daily. 10/28/19   Jerrol Banana., MD  tamsulosin (FLOMAX) 0.4 MG CAPS capsule Take 0.4 mg by mouth daily.    [provider]  vitamin B-12 1000 MCG tablet Take 1 tablet (1,000 mcg total) by mouth daily. 01/05/18   Loney Hering D, MD    Physical Exam: Vitals:   06/22/20 1300 06/22/20 1330 06/22/20 1400 06/22/20 1500  BP: 102/68 (!) 93/46 (!) 86/49 (!) 97/52  Pulse: (!) 103 (!) 116 89 95  Resp: (!) 23 (!) 30 18 20   Temp:        TempSrc:      SpO2: (!) 81% (!) 85% (!) 71% 97%  Weight:      Height:         Vitals:   06/22/20 1300 06/22/20 1330 06/22/20 1400 06/22/20 1500  BP: 102/68 (!) 93/46 (!) 86/49 (!) 97/52  Pulse: (!) 103 (!) 116 89 95  Resp: (!) 23 (!) 30 18 20   Temp:      TempSrc:      SpO2: (!) 81% (!) 85% (!) 71% 97%  Weight:      Height:        Constitutional: NAD, alert and oriented x 3.  Chronically ill-appearing.  Looks frail Eyes: PERRL, lids and conjunctivae normal ENMT: Mucous membranes are moist.  Neck: normal, supple, no masses, no thyromegaly Respiratory: Decreased air entry at the left lung base, no wheezing, no crackles. Normal respiratory effort. No accessory muscle use.  Left pigtail catheter in place with bloody drainage Cardiovascular: Sinus tachycardia, no murmurs / rubs / gallops. 3+ extremity edema. 2+ pedal pulses. No carotid bruits.  Abdomen: no tenderness, no masses palpated. No hepatosplenomegaly. Bowel sounds positive.  Musculoskeletal: no clubbing / cyanosis. No joint deformity upper and lower extremities.  Skin: no rashes, lesions, ulcers.  Neurologic: No gross focal neurologic deficit.  Generalized weakness Psychiatric: Normal mood and affect.   Labs on Admission: I have personally reviewed following labs and imaging studies  CBC: Recent Labs  Lab 06/17/20 1316 06/22/20 1118 06/22/20 1618  WBC 8.7 9.8  --   NEUTROABS 6.7  --   --   HGB 12.9* 9.8* 11.0*  HCT 38.4 29.1* 32.2*  MCV 92 90.9  --   PLT 174 220  --    Basic Metabolic Panel: Recent Labs  Lab 06/17/20 1316 06/22/20 1118  NA 138 131*  K 5.2 5.5*  CL 101 100  CO2 18* 21*  GLUCOSE 81 106*  BUN 51* 70*  CREATININE 1.98* 2.75*  CALCIUM 9.1 8.9   GFR: Estimated Creatinine Clearance: 25.1 mL/min (A) (by C-G formula based on SCr of 2.75 mg/dL (H)). Liver Function Tests: Recent Labs  Lab 06/17/20 1316  AST 22  ALT 21  ALKPHOS 67  BILITOT 0.3  PROT 6.2  ALBUMIN 3.4*   No results for  input(s): LIPASE, AMYLASE in the last 168 hours. No results for input(s): AMMONIA in the last 168 hours. Coagulation Profile: Recent Labs  Lab 06/22/20 1404  INR 1.2  Cardiac Enzymes: No results for input(s): CKTOTAL, CKMB, CKMBINDEX, TROPONINI in the last 168 hours. BNP (last 3 results) No results for input(s): PROBNP in the last 8760 hours. HbA1C: No results for input(s): HGBA1C in the last 72 hours. CBG: No results for input(s): GLUCAP in the last 168 hours. Lipid Profile: No results for input(s): CHOL, HDL, LDLCALC, TRIG, CHOLHDL, LDLDIRECT in the last 72 hours. Thyroid Function Tests: No results for input(s): TSH, T4TOTAL, FREET4, T3FREE, THYROIDAB in the last 72 hours. Anemia Panel: No results for input(s): VITAMINB12, FOLATE, FERRITIN, TIBC, IRON, RETICCTPCT in the last 72 hours. Urine analysis:    Component Value Date/Time   APPEARANCEUR Clear 07/31/2019 1109   GLUCOSEU Negative 07/31/2019 1109   BILIRUBINUR negative 04/23/2020 1511   BILIRUBINUR Negative 07/31/2019 1109   PROTEINUR Negative 04/23/2020 1511   PROTEINUR Negative 07/31/2019 1109   UROBILINOGEN 2.0 (A) 04/23/2020 1511   NITRITE negative 04/23/2020 1511   NITRITE Negative 07/31/2019 1109   LEUKOCYTESUR Negative 04/23/2020 1511   LEUKOCYTESUR Negative 07/31/2019 1109    Radiological Exams on Admission: DG Chest 2 View  Result Date: 06/22/2020 CLINICAL DATA:  Shortness of breath.  Hypotensive. EXAM: CHEST - 2 VIEW COMPARISON:  06/17/2020 FINDINGS: Complete opacification of the left lung with rightward shift of the mediastinum. Linear opacities in the right lung base. No visible pneumothorax. Cardiomediastinal silhouette is largely obscured. No acute osseous abnormality. IMPRESSION: 1. Complete opacification of the left lung, likely related to a large pleural effusion (increased from prior) and possibly overlying consolidation. There is rightward mediastinal shift. Correlate for evidence of tension  physiology. 2. Linear opacities in the right lung base, which could represent atelectasis, aspiration, and/or pneumonia. Electronically Signed   By: Margaretha Sheffield MD   On: 06/22/2020 11:45   DG Chest Port 1 View  Result Date: 06/22/2020 CLINICAL DATA:  Postop check.  Chest tube placement. EXAM: PORTABLE CHEST 1 VIEW COMPARISON:  Radiograph earlier this day.  Most recent CT 06/06/2019 FINDINGS: Pigtail catheter projects over the left mid lung. No visualized pneumothorax. Improved hazy opacity at the left lung base consistent with decreasing pleural effusion and associated airspace disease/atelectasis. Small volume of pleural fluid and or thickening persists. Slight increase in right infrahilar atelectasis. Stable heart size and mediastinal contours. IMPRESSION: 1. Left pigtail catheter in place. No visualized pneumothorax. 2. Improved hazy opacity at the left lung base consistent with decreasing pleural effusion and associated airspace disease/atelectasis. Small volume of pleural fluid and or thickening persists. Electronically Signed   By: Keith Rake M.D.   On: 06/22/2020 15:44   DG Chest Portable 1 View  Result Date: 06/22/2020 CLINICAL DATA:  Chest tube placement EXAM: PORTABLE CHEST 1 VIEW COMPARISON:  June 22, 2020 study obtained earlier in the day FINDINGS: Chest tube present on the left with considerable diminution in size of left pleural effusion. There is a fairly small residual left pleural effusion with patchy atelectasis in the left mid and lower lung regions. Lungs elsewhere are clear. Heart is upper normal in size with pulmonary vascularity normal. No adenopathy. No pneumothorax. No bone lesions. IMPRESSION: Left pleural effusion considerably smaller after chest tube placement. There is a fairly small persistent left pleural effusion with atelectatic change in the left mid and lower lung regions. Lungs otherwise clear. Heart upper normal in size. Electronically Signed   By: Lowella Grip III M.D.   On: 06/22/2020 13:49    EKG: Independently reviewed.  Sinus tachycardia  Assessment/Plan Principal Problem:   Acute  respiratory failure (HCC) Active Problems:   BPH with obstruction/lower urinary tract symptoms   Thrombocytopenia (HCC)   Hypertension   Cirrhosis ([place])   Hemothorax on left     Acute respiratory failure Most likely secondary to hemothorax Patient was noted to have room air pulse oximetry in the 80s and is currently on 4 L of oxygen with pulse oximetry greater than 92% We will attempt to wean patient off oxygen once his acute episode has improved/resolved    Acute kidney injury Unclear etiology but may be prerenal We will obtain renal ultrasound to rule out a postobstructive component Patient has a baseline serum creatinine of 0.85 on admission it was 2.75 His BUN is elevated as well Gentle IV fluid hydration Repeat renal parameters in a.m.    Hemothorax, Left Unclear etiology Patient denies any trauma  May be related to underlying ??malignancy He has a chest tube in place and has drained about 2.5 L of bloody fluid Monitor H&H closely Consult CT surgery     Hyperkalemia Most likely iatrogenic and secondary to potassium supplements Will treat with dextrose and insulin   GERD Continue Protonix   BPH Continue Flomax and Avodart    History of liver cirrhosis Stable   ??  History of mesothelioma We will request pulmonology and oncology consult during this hospitalization    Anemia of chronic disease Patient has a baseline hemoglobin of about 12.9g/dl and today on admission he was noted to have a drop in his hemoglobin to 9 g/dl He has had about 2.5 L of blood drained from his left chest We will monitor H&H closely  DVT prophylaxis: SCD Code Status: Full code Family Communication: Greater than 50% of time was spent discussing plan of care with patient and his wife at the bedside.  All questions and concerns have  been addressed.  They verbalized understanding and agree with the plan.  CODE STATUS was discussed and patient wants to be full code Disposition Plan: Back to previous home environment Consults called: Oncology/pulmonology/CT surgery/palliative care    Harriette Tovey MD [facility]     06/22/2020, 5:19 PM

## 2020-06-22 NOTE — Consult Note (Addendum)
Reason for Consult: Hemothorax Referring Physician: Collier Bullock, MD  Dwayne Thompson is an 74 y.o. male.  HPI:  74 year old former smoker, last saw him on 08 June 2020 due to a recurrent left pleural effusion. Previously the patient had been seen for abnormal low-dose CT chest for lung cancer screening for issues that were on the RIGHT lung.  These issues resolved completely.  We last saw him June 2020 and then he was lost to follow-up.  In around 26 April 2020 he developed issues with left flank pain.  Evaluation eventually yielded the finding of a left pleural effusion.  He underwent thoracentesis on 1 September which showed bloody fluid however cytology was negative and cultures were negative.  The patient presented for evaluation on 16 September as noted above.  He had recurrent intense pleuritic pain and the effusion had recurred.  The patient had been treated with antibiotics and prednisone without relief.  Repeat thoracentesis was performed on 17 September this was followed by imaging with CT.  The CT of the chest has been reviewed independently.  This shows pleural thickening and nodularity with a pleural-based mass and central adenopathy versus mass as well.  There is also increasing size of upper abdominal lymph nodes suspicious for disease below the diaphragm.  The findings are concerning for possible mesothelioma.  The pleural fluid chemistry is consistent with exudative effusion.  The fluid was also serosanguineous concerning for carcinoma.  Unfortunately cytology has been negative due to the intense pleural reaction.  He underwent PET/CT on 24 September that showed extensive disease in the chest with pleural effusion and pleural-based mass that was intensely FDG avid.  He was scheduled for CT-guided biopsy of the pleural-based mass tomorrow at [facility].  Scheduling has been done at [facility] due to current IR staffing shortages.  Today he was seen in consultation by Dr. Marta Lamas, on  evaluation in his office the patient was exceedingly tachypneic, hypoxic and hypotensive.  He was referred to the emergency room and a large pleural effusion opacifying the left chest was noted.  Chest tube was placed in the ED.  Patient had a hemothorax.  It was noted that his H&H had dropped at least 3 g from prior.  He is also noted to have acute kidney injury.   He is currently in the emergency room awaiting admission.  Oxygenation has improved tremendously since chest tube placed and hemothorax evacuated.  The patient voices no new complaint except for that noted prior with his of intense pleurodynia.  His shortness of breath markedly improved from prior.  Voices no other complaint.   Past Medical History:  Diagnosis Date  . Anemia   . Back pain   . BPH (benign prostatic hyperplasia)   . Cirrhosis ([place])   . COPD (chronic obstructive pulmonary disease) ([place])   . ED (erectile dysfunction)   . GERD (gastroesophageal reflux disease)   . Hyperlipidemia   . Hypertension     Past Surgical History:  Procedure Laterality Date  . COLONOSCOPY Left 01/03/2018   Procedure: COLONOSCOPY;  Surgeon: Virgel Manifold, MD;  Location: [facility] ENDOSCOPY;  Service: Endoscopy;  Laterality: Left;  . COLONOSCOPY WITH PROPOFOL N/A 04/05/2018   Procedure: COLONOSCOPY WITH PROPOFOL;  Surgeon: Virgel Manifold, MD;  Location: [facility] ENDOSCOPY;  Service: Endoscopy;  Laterality: N/A;  . CYSTOSCOPY WITH STENT PLACEMENT Right 09/26/2019   Procedure: CYSTOSCOPY WITH STENT PLACEMENT;  Surgeon: Royston Cowper, MD;  Location: [facility] ORS;  Service: Urology;  Laterality: Right;  . ENTEROSCOPY  01/03/2018   Procedure: ENTEROSCOPY;  Surgeon: Virgel Manifold, MD;  Location: [facility] ENDOSCOPY;  Service: Endoscopy;;  . ESOPHAGOGASTRODUODENOSCOPY N/A 01/02/2018   Procedure: ESOPHAGOGASTRODUODENOSCOPY (EGD);  Surgeon: Virgel Manifold, MD;  Location: [facility] ENDOSCOPY;  Service: Endoscopy;  Laterality: N/A;  .  ESOPHAGOGASTRODUODENOSCOPY (EGD) WITH PROPOFOL N/A 04/05/2018   Procedure: ESOPHAGOGASTRODUODENOSCOPY (EGD) WITH PROPOFOL;  Surgeon: Virgel Manifold, MD;  Location: [facility] ENDOSCOPY;  Service: Endoscopy;  Laterality: N/A;  . exploration and neurolysis of right peroneal nerve at the knee Right 08/27/2018  . GREEN LIGHT LASER TURP (TRANSURETHRAL RESECTION OF PROSTATE N/A 09/26/2019   Procedure: GREEN LIGHT LASER TURP (TRANSURETHRAL RESECTION OF PROSTATE;  Surgeon: Royston Cowper, MD;  Location: [facility] ORS;  Service: Urology;  Laterality: N/A;  . HERNIA REPAIR     1980's    Family History  Problem Relation Age of Onset  . Alzheimer's disease Mother   . Sarcoidosis Sister   . Anxiety disorder Brother   . Kidney disease Neg Hx   . Prostate cancer Neg Hx    Social History   Tobacco Use  . Smoking status: Former Smoker    Packs/day: 1.00    Years: 50.00    Pack years: 50.00    Types: Cigarettes    Quit date: 10/20/2013    Years since quitting: 6.6  . Smokeless tobacco: Never Used  Substance Use Topics  . Alcohol use: Yes    Alcohol/week: 21.0 - 28.0 standard drinks    Types: 21 - 28 Shots of liquor per week    Comment: 3-4 gin and sodas a day   Significant prior history of asbestos exposure  Allergies:  Allergies  Allergen Reactions  . Indomethacin Swelling    ankle swelling  . Tamsulosin Hcl Hives  . Sulfa Antibiotics Rash    Medications: Medications prior to admission reviewed.  Results for orders placed or performed during the hospital encounter of 06/22/20 (from the past 48 hour(s))  Basic metabolic panel     Status: Abnormal   Collection Time: 06/22/20 11:18 AM  Result Value Ref Range   Sodium 131 (L) 135 - 145 mmol/L   Potassium 5.5 (H) 3.5 - 5.1 mmol/L   Chloride 100 98 - 111 mmol/L   CO2 21 (L) 22 - 32 mmol/L   Glucose, Bld 106 (H) 70 - 99 mg/dL    Comment: Glucose reference range applies only to samples taken after fasting for at least 8 hours.   BUN 70 (H) 8  - 23 mg/dL   Creatinine, Ser 2.75 (H) 0.61 - 1.24 mg/dL   Calcium 8.9 8.9 - 10.3 mg/dL   GFR calc non Af Amer 22 (L) >60 mL/min   GFR calc Af Amer 25 (L) >60 mL/min   Anion gap 10 5 - 15    Comment: Performed at [facility], [address] [place], [place] [address]  CBC     Status: Abnormal   Collection Time: 06/22/20 11:18 AM  Result Value Ref Range   WBC 9.8 4.0 - 10.5 K/uL   RBC 3.20 (L) 4.22 - 5.81 MIL/uL   Hemoglobin 9.8 (L) 13.0 - 17.0 g/dL   HCT 29.1 (L) 39 - 52 %   MCV 90.9 80.0 - 100.0 fL   MCH 30.6 26.0 - 34.0 pg   MCHC 33.7 30.0 - 36.0 g/dL   RDW 12.5 11.5 - 15.5 %   Platelets 220 150 - 400 K/uL   nRBC 0.0 0.0 -  0.2 %    Comment: Performed at [facility], [place], [place] [address]  Troponin I (High Sensitivity)     Status: None   Collection Time: 06/22/20 11:18 AM  Result Value Ref Range   Troponin I (High Sensitivity) 4 <18 ng/L    Comment: (NOTE) Elevated high sensitivity troponin I (hsTnI) values and significant  changes across serial measurements may suggest ACS but many other  chronic and acute conditions are known to elevate hsTnI results.  Refer to the "Links" section for chest pain algorithms and additional  guidance. Performed at [facility], [place], [place] [address]   Troponin I (High Sensitivity)     Status: None   Collection Time: 06/22/20  2:04 PM  Result Value Ref Range   Troponin I (High Sensitivity) 3 <18 ng/L    Comment: (NOTE) Elevated high sensitivity troponin I (hsTnI) values and significant  changes across serial measurements may suggest ACS but many other  chronic and acute conditions are known to elevate hsTnI results.  Refer to the "Links" section for chest pain algorithms and additional  guidance. Performed at [facility], [address] [place], [place] [address]   Protime-INR     Status: None   Collection Time: 06/22/20  2:04 PM  Result Value Ref  Range   Prothrombin Time 14.5 11.4 - 15.2 seconds   INR 1.2 0.8 - 1.2    Comment: (NOTE) INR goal varies based on device and disease states. Performed at [facility], [address] [place], [place] [address]   APTT     Status: None   Collection Time: 06/22/20  2:04 PM  Result Value Ref Range   aPTT 35 24 - 36 seconds    Comment: Performed at [facility], [address] [place], [place] [address]  Type and screen [place]     Status: None (Preliminary result)   Collection Time: 06/22/20  2:04 PM  Result Value Ref Range   ABO/RH(D) PENDING    Antibody Screen PENDING    Sample Expiration      06/25/2020,2359 Performed at [facility], [address] [place],  [address]     DG Chest 2 View  Result Date: 06/22/2020 CLINICAL DATA:  Shortness of breath.  Hypotensive. EXAM: CHEST - 2 VIEW COMPARISON:  06/17/2020 FINDINGS: Complete opacification of the left lung with rightward shift of the mediastinum. Linear opacities in the right lung base. No visible pneumothorax. Cardiomediastinal silhouette is largely obscured. No acute osseous abnormality. IMPRESSION: 1. Complete opacification of the left lung, likely related to a large pleural effusion (increased from prior) and possibly overlying consolidation. There is rightward mediastinal shift. Correlate for evidence of tension physiology. 2. Linear opacities in the right lung base, which could represent atelectasis, aspiration, and/or pneumonia. Electronically Signed   By: Margaretha Sheffield MD   On: 06/22/2020 11:45   DG Chest Port 1 View  Result Date: 06/22/2020 CLINICAL DATA:  Postop check.  Chest tube placement. EXAM: PORTABLE CHEST 1 VIEW COMPARISON:  Radiograph earlier this day.  Most recent CT 06/06/2019 FINDINGS: Pigtail catheter projects over the left mid lung. No visualized pneumothorax. Improved hazy opacity at the left lung base consistent with decreasing pleural effusion  and associated airspace disease/atelectasis. Small volume of pleural fluid and or thickening persists. Slight increase in right infrahilar atelectasis. Stable heart size and mediastinal contours. IMPRESSION: 1. Left pigtail catheter in place. No visualized pneumothorax.  2. Improved hazy opacity at the left lung base consistent with decreasing pleural effusion and associated airspace disease/atelectasis. Small volume of pleural fluid and or thickening persists. Electronically Signed   By: Keith Rake M.D.   On: 06/22/2020 15:44   DG Chest Portable 1 View  Result Date: 06/22/2020 CLINICAL DATA:  Chest tube placement EXAM: PORTABLE CHEST 1 VIEW COMPARISON:  June 22, 2020 study obtained earlier in the day FINDINGS: Chest tube present on the left with considerable diminution in size of left pleural effusion. There is a fairly small residual left pleural effusion with patchy atelectasis in the left mid and lower lung regions. Lungs elsewhere are clear. Heart is upper normal in size with pulmonary vascularity normal. No adenopathy. No pneumothorax. No bone lesions. IMPRESSION: Left pleural effusion considerably smaller after chest tube placement. There is a fairly small persistent left pleural effusion with atelectatic change in the left mid and lower lung regions. Lungs otherwise clear. Heart upper normal in size. Electronically Signed   By: Lowella Grip III M.D.   On: 06/22/2020 13:49    Review of Systems  A 10 point review of systems was performed and it is as noted above otherwise negative.  Blood pressure (!) 97/52, pulse 95, temperature 97.7 F (36.5 C), temperature source Oral, resp. rate 20, height 5\' 11"  (1.803 m), weight 76.2 kg, SpO2 97 %. Physical Exam GENERAL: Acutely ill-appearing man, no acute distress at present comfortable with nasal cannula O2 in place. HEAD: Normocephalic, atraumatic.  EYES: Pupils equal, round, reactive to light.  No scleral icterus.  MOUTH: Oral mucosa  moist.  Oropharynx clear. NECK: Supple. No thyromegaly. Trachea midline. No JVD.  No adenopathy. PULMONARY: Good air entry bilaterally.  Diminished breath sounds on the left base.  Chest tube in place on the left.   CARDIOVASCULAR: S1 and S2. Regular rate and rhythm.  ABDOMEN: Soft, nondistended, nontender. MUSCULOSKELETAL: No joint deformity, no clubbing, no edema.  NEUROLOGIC: No focal deficit noted.  Speech is fluent. SKIN: Intact,warm,dry. PSYCH: Depressed mood, somewhat flat affect.   Assessment/Plan:  Acute respiratory failure with hypoxia Due to large pleural effusion (hemothorax) Chest tube in place Oxygen requirements decreasing after chest tube placement Wean O2 for saturations over 90%.  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 Genevive Bi Agree with oncology consultation  Pleurodynia Analgesia  Acute on chronic renal failure Volume resuscitate Avoid nephrotoxins Trend renal panel Consider nephrology consult if worsens   C. Derrill Kay, MD [facility] 06/22/2020, 3:52 PM   *This note was dictated using voice recognition software/Dragon.  Despite best efforts to proofread, errors can occur which can change the meaning.  Any change was purely unintentional.

## 2020-06-22 NOTE — ED Notes (Signed)
Dr. Tamala Julian explaining chest tube to pt and wife at this time.  Pt and wife verbalize understanding of information shared.

## 2020-06-22 NOTE — Patient Instructions (Signed)
Patient transported to Emergency room for care.

## 2020-06-22 NOTE — Consult Note (Signed)
[facility]  Date of admission:  06/22/2020  Inpatient day:  06/22/2020  Consulting physician:  Dr Royce Macadamia Agbata   Reason for Consultation:  ?? Mesothelioma  Chief Complaint: Dwayne Thompson is a 74 y.o. male with recurrent left sided pleural effusion who was admitted through the emergency room with acute respiratory failure secondary to a hemothorax.  HPI:  The patient has a 50 pack year smoking history.  He also notes exposure to asbestos while working for [employer]. Chest CT on 08/28/2019 revealed the RUL pleural based density was no longer evident.  There was minimal residual scarring c/w a resolved infectious process.  There were stable subcentimeter bilateral subpleural pulmonary nodules.  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 Hematocrit was 40.5, hemoglobin 14.2, MCV 87.1, and platelets 117,000.  Ferritin was 183.  He was to continue oral F75 and folic acid.  The patient was seen by Dr Rosanna Randy on 04/30/2020 with follow-up on 05/14/2020.  He noted left flank pain then shortness of breath when laying on his side.  He then described shortness of breath when supine and pleuritic left sided chest pain.  Renal stone study on 0 818/2021 revealed nodularity in the left lung base.  Chest CT on 05/15/2020 revealed a moderate to large left-sided pleural effusion and partial collapse of the left lower lobe with groundglass opacities.  He has undergone thoracentesis x2 (05/20/2020 and 06/05/2020).  2 L of bloody fluid was removed on 05/20/2020 and 1.8 L of serosanguineous fluid on 06/05/2020.  There was no evidence of malignancy in either sample.  Chest CT on 06/05/2020 revealed pleural thickening, nodularity and pleural-based mass with central adenopathy versus mass suspicious for pulmonary neoplasm with extensive pleural metastasis.   Mesothelioma was in the differential.  There was increasing left subcarinal lymph node use into the left mainstem bronchus.  There was increasing upper abdominal lymph nodes.  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  There was accentuated activity along the consolidative and atelectatic left lower lobe probably from tumor within the lung but potentially from tumor along the visceral pleura.  There was hypermetabolic thoracic adenopathy along with hypermetabolic upper abdominal adenopathy and hypermetabolic skeletal metastatic lesions (right iliac bone SUV 11.9 and T1). There was possible early tumor extension into the left T11-12 neuroforamen with some erosion of the adjacent T11 vertebral body.  CXR on 06/17/2020 revealed increasing left-sided pleural effusion and increasing consolidation with volume loss in the left lung and developing infiltration and peribronchial thickening in the right lung base.  He has been seen by Dr. Patsey Berthold.  He was initially scheduled for pleural biopsy on 06/23/2020 at [facility] in [place].  He was seen by Dr. Genevive Bi on 06/22/2020 for recurrent left-sided pleural effusion.  He was noted to have increased shortness of breath even at rest.  He was unable to lie in the supine position.  He could only stand or sit with no relief of symptoms.  He was noted to be hypotensive with oxygen saturations in the high 80s.  He was placed on oxygen and transported to the [facility] ER for evaluation.  CXR on 06/22/2020 revealed complete of opacification of the left lung secondary to a large pleural effusion.  There was right sided shift.  He has received IVF.  Chest tube  was placed; 2 liters of bloody fluid was drained. Shortness of breath has improved.  He is on 3 liters/min oxygen via nasal cannula.   Past Medical History:  Diagnosis Date  . Anemia   . Back pain   . BPH (benign  prostatic hyperplasia)   . Cirrhosis ([place])   . COPD (chronic obstructive pulmonary disease) ([place])   . ED (erectile dysfunction)   . GERD (gastroesophageal reflux disease)   . Hyperlipidemia   . Hypertension     Past Surgical History:  Procedure Laterality Date  . COLONOSCOPY Left 01/03/2018   Procedure: COLONOSCOPY;  Surgeon: Virgel Manifold, MD;  Location: [facility] ENDOSCOPY;  Service: Endoscopy;  Laterality: Left;  . COLONOSCOPY WITH PROPOFOL N/A 04/05/2018   Procedure: COLONOSCOPY WITH PROPOFOL;  Surgeon: Virgel Manifold, MD;  Location: [facility] ENDOSCOPY;  Service: Endoscopy;  Laterality: N/A;  . CYSTOSCOPY WITH STENT PLACEMENT Right 09/26/2019   Procedure: CYSTOSCOPY WITH STENT PLACEMENT;  Surgeon: Royston Cowper, MD;  Location: [facility] ORS;  Service: Urology;  Laterality: Right;  . ENTEROSCOPY  01/03/2018   Procedure: ENTEROSCOPY;  Surgeon: Virgel Manifold, MD;  Location: [facility] ENDOSCOPY;  Service: Endoscopy;;  . ESOPHAGOGASTRODUODENOSCOPY N/A 01/02/2018   Procedure: ESOPHAGOGASTRODUODENOSCOPY (EGD);  Surgeon: Virgel Manifold, MD;  Location: [facility] ENDOSCOPY;  Service: Endoscopy;  Laterality: N/A;  . ESOPHAGOGASTRODUODENOSCOPY (EGD) WITH PROPOFOL N/A 04/05/2018   Procedure: ESOPHAGOGASTRODUODENOSCOPY (EGD) WITH PROPOFOL;  Surgeon: Virgel Manifold, MD;  Location: [facility] ENDOSCOPY;  Service: Endoscopy;  Laterality: N/A;  . exploration and neurolysis of right peroneal nerve at the knee Right 08/27/2018  . GREEN LIGHT LASER TURP (TRANSURETHRAL RESECTION OF PROSTATE N/A 09/26/2019   Procedure: GREEN LIGHT LASER TURP (TRANSURETHRAL RESECTION OF PROSTATE;  Surgeon: Royston Cowper, MD;  Location: [facility] ORS;  Service: Urology;  Laterality: N/A;  . HERNIA REPAIR     1980's    Family History  Problem Relation Age of Onset  . Alzheimer's disease Mother   . Sarcoidosis Sister   . Anxiety disorder Brother   . Kidney disease Neg Hx   . Prostate cancer Neg Hx     Social History:   reports that he quit smoking about 6 years ago. His smoking use included cigarettes. He has a 50.00 pack-year smoking history. He has never used smokeless tobacco. He reports current alcohol use of about 21.0 - 28.0 standard drinks of alcohol per week. He reports that he does not use drugs.  The patient notes exposure to asbestos.  The patient lives in [place].  He is accompanied by his wife, [name].  Allergies:  Allergies  Allergen Reactions  . Indomethacin Swelling    ankle swelling  . Tamsulosin Hcl Hives  . Sulfa Antibiotics Rash    (Not in a hospital admission)   Review of Systems: GENERAL:  Fatigue.  Minimal activity at home.  No fevers or sweats.  Weight loss of 10 pounds. PERFORMANCE STATUS (ECOG):  2 HEENT:  Runny nose.  No visual changes, sore throat, mouth sores or tenderness. Lungs:  Shortness of breath.  Unable to lie supine.  Pleuritic chest pain.  No hemoptysis. Cardiac:  Left sided chest pain.  No palpitations. GI:  Constipation.  No nausea, vomiting, diarrhea, melena or hematochezia. GU:  No urgency, frequency, dysuria, or hematuria. Musculoskeletal:  No back pain.  No joint pain.  No muscle tenderness. Extremities:  Lower extremity (especially feet) swelling. Skin:  No rashes or skin changes. Neuro:  General weakness.  No headache, numbness or weakness,  balance or coordination issues. Endocrine:  No diabetes, thyroid issues, hot flashes or night sweats. Psych:  No mood changes, depression or anxiety. Pain:  Left sided chest pain. Review of systems:  All other systems reviewed and found to be negative.  Physical Exam:  Blood pressure (!) 92/52, pulse 94, temperature 97.7 F (36.5 C), temperature source Oral, resp. rate 17, height 5\' 11"  (1.803 m), weight 168 lb (76.2 kg), SpO2 98 %.  GENERAL:  Fatigued gentleman sitting up in bed in the emergency room in no acute distress. MENTAL STATUS:  Alert and oriented to person, place and time. HEAD:  Short graying hair.   Normocephalic, atraumatic, face symmetric, no Cushingoid features. EYES:  Glasses.  Brown eyes.  Pupils equal round and reactive to light and accomodation.  No conjunctivitis or scleral icterus. ENT:  [place] in place.  Oropharynx clear without lesion.  Tongue normal. Mucous membranes moist.  RESPIRATORY:  Decreased breath sounds left base with crackles at base on deep inspiration.  Crackles right base. CARDIOVASCULAR:  Regular rate and rhythm without murmur, rub or gallop. ABDOMEN:  Soft, non-tender, with active bowel sounds, and no hepatosplenomegaly.  No masses. SKIN:  No rashes, ulcers or lesions. EXTREMITIES: Bilateral lower extremity edema.  No skin discoloration or tenderness.  No palpable cords. LYMPH NODES: No palpable cervical, supraclavicular, axillary or inguinal adenopathy  NEUROLOGICAL: Unremarkable. PSYCH:  Appropriate.   Results for orders placed or performed during the hospital encounter of 06/22/20 (from the past 48 hour(s))  Basic metabolic panel     Status: Abnormal   Collection Time: 06/22/20 11:18 AM  Result Value Ref Range   Sodium 131 (L) 135 - 145 mmol/L   Potassium 5.5 (H) 3.5 - 5.1 mmol/L   Chloride 100 98 - 111 mmol/L   CO2 21 (L) 22 - 32 mmol/L   Glucose, Bld 106 (H) 70 - 99 mg/dL    Comment: Glucose reference range applies only to samples taken after fasting for at least 8 hours.   BUN 70 (H) 8 - 23 mg/dL   Creatinine, Ser 2.75 (H) 0.61 - 1.24 mg/dL   Calcium 8.9 8.9 - 10.3 mg/dL   GFR calc non Af Amer 22 (L) >60 mL/min   GFR calc Af Amer 25 (L) >60 mL/min   Anion gap 10 5 - 15    Comment: Performed at [facility], [address] [place], [place] [address]  CBC     Status: Abnormal   Collection Time: 06/22/20 11:18 AM  Result Value Ref Range   WBC 9.8 4.0 - 10.5 K/uL   RBC 3.20 (L) 4.22 - 5.81 MIL/uL   Hemoglobin 9.8 (L) 13.0 - 17.0 g/dL   HCT 29.1 (L) 39 - 52 %   MCV 90.9 80.0 - 100.0 fL   MCH 30.6 26.0 - 34.0 pg   MCHC 33.7 30.0 - 36.0  g/dL   RDW 12.5 11.5 - 15.5 %   Platelets 220 150 - 400 K/uL   nRBC 0.0 0.0 - 0.2 %    Comment: Performed at [facility], [place], [place] [address]  Troponin I (High Sensitivity)     Status: None   Collection Time: 06/22/20 11:18 AM  Result Value Ref Range   Troponin I (High Sensitivity) 4 <18 ng/L    Comment: (NOTE) Elevated high sensitivity troponin I (hsTnI) values and significant  changes across serial measurements may suggest ACS but many other  chronic and acute conditions are known to  elevate hsTnI results.  Refer to the "Links" section for chest pain algorithms and additional  guidance. Performed at [facility], [place], [place] [address]   Troponin I (High Sensitivity)     Status: None   Collection Time: 06/22/20  2:04 PM  Result Value Ref Range   Troponin I (High Sensitivity) 3 <18 ng/L    Comment: (NOTE) Elevated high sensitivity troponin I (hsTnI) values and significant  changes across serial measurements may suggest ACS but many other  chronic and acute conditions are known to elevate hsTnI results.  Refer to the "Links" section for chest pain algorithms and additional  guidance. Performed at [facility], [address] [place], [place] [address]   Protime-INR     Status: None   Collection Time: 06/22/20  2:04 PM  Result Value Ref Range   Prothrombin Time 14.5 11.4 - 15.2 seconds   INR 1.2 0.8 - 1.2    Comment: (NOTE) INR goal varies based on device and disease states. Performed at [facility], [address] [place], [place] [address]   APTT     Status: None   Collection Time: 06/22/20  2:04 PM  Result Value Ref Range   aPTT 35 24 - 36 seconds    Comment: Performed at [facility], [address] [place], [place] [address]  Type and screen [place]     Status: None (Preliminary result)   Collection Time: 06/22/20  2:04 PM  Result Value Ref  Range   ABO/RH(D) PENDING    Antibody Screen PENDING    Sample Expiration      06/25/2020,2359 Performed at [facility], [address] [place], [place] [address]   Type and screen     Status: None   Collection Time: 06/22/20  4:18 PM  Result Value Ref Range   ABO/RH(D) A POS    Antibody Screen NEG    Sample Expiration      06/25/2020,2359 Performed at [facility], [address] [place], [place] [address]   Hemoglobin and hematocrit, blood     Status: Abnormal   Collection Time: 06/22/20  4:18 PM  Result Value Ref Range   Hemoglobin 11.0 (L) 13.0 - 17.0 g/dL   HCT 32.2 (L) 39 - 52 %    Comment: Performed at [facility], [address] [place], [place] [address]   DG Chest 2 View  Result Date: 06/22/2020 CLINICAL DATA:  Shortness of breath.  Hypotensive. EXAM: CHEST - 2 VIEW COMPARISON:  06/17/2020 FINDINGS: Complete opacification of the left lung with rightward shift of the mediastinum. Linear opacities in the right lung base. No visible pneumothorax. Cardiomediastinal silhouette is largely obscured. No acute osseous abnormality. IMPRESSION: 1. Complete opacification of the left lung, likely related to a large pleural effusion (increased from prior) and possibly overlying consolidation. There is rightward mediastinal shift. Correlate for evidence of tension physiology. 2. Linear opacities in the right lung base, which could represent atelectasis, aspiration, and/or pneumonia. Electronically Signed   By: Margaretha Sheffield MD   On: 06/22/2020 11:45   DG Chest Port 1 View  Result Date: 06/22/2020 CLINICAL DATA:  Postop check.  Chest tube placement. EXAM: PORTABLE CHEST 1 VIEW COMPARISON:  Radiograph earlier this day.  Most recent CT 06/06/2019 FINDINGS: Pigtail catheter projects over the left mid lung. No visualized pneumothorax. Improved hazy opacity at the left lung base consistent with decreasing pleural effusion and associated airspace  disease/atelectasis. Small volume of pleural fluid and or thickening persists. Slight increase in right infrahilar atelectasis. Stable heart size and mediastinal contours. IMPRESSION: 1. Left pigtail catheter in place. No visualized pneumothorax. 2. Improved hazy opacity at the left lung base consistent with decreasing pleural effusion and associated airspace disease/atelectasis. Small volume of pleural fluid and or thickening persists. Electronically Signed   By: Keith Rake M.D.   On: 06/22/2020 15:44   DG Chest Portable 1 View  Result Date: 06/22/2020 CLINICAL DATA:  Chest tube placement EXAM: PORTABLE CHEST 1 VIEW COMPARISON:  June 22, 2020 study obtained earlier in the day FINDINGS: Chest tube present on the left with considerable diminution in size of left pleural effusion. There is a fairly small residual left pleural effusion with patchy atelectasis in the left mid and lower lung regions. Lungs elsewhere are clear. Heart is upper normal in size with pulmonary vascularity normal. No adenopathy. No pneumothorax. No bone lesions. IMPRESSION: Left pleural effusion considerably smaller after chest tube placement. There is a fairly small persistent left pleural effusion with atelectatic change in the left mid and lower lung regions. Lungs otherwise clear. Heart upper normal in size. Electronically Signed   By: Lowella Grip III M.D.   On: 06/22/2020 13:49    Assessment:  The patient is a 74 y.o. gentleman with left hemothorax s/p chest tube placement. Thoracentesis x2 (05/20/2020 and 06/05/2020) revealed bloody fluid with no evidence of malignancy.  Chest CT on 06/05/2020 revealed pleural thickening, nodularity and pleural-based mass with central adenopathy versus mass suspicious for pulmonary neoplasm with extensive pleural metastasis.  Mesothelioma is in the differential.  There was increasing left subcarinal lymph node use into the left mainstem bronchus.  There was increasing upper  abdominal lymph nodes.  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  There was accentuated activity along the consolidative and atelectatic left lower lobe probably from tumor within the lung but potentially from tumor along the visceral pleura.  There was hypermetabolic thoracic adenopathy along with hypermetabolic upper abdominal adenopathy and hypermetabolic skeletal metastatic lesions (right iliac bone SUV 11.9 and T1). There was possible early tumor extension into the left T11-12 neuroforamen with some erosion of the adjacent T11 vertebral body.  CXR on 06/22/2020 revealed complete of opacification of the left lung secondary to a large pleural effusion.  There was right sided shift.  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 He drinks alcohol daily.  Symptomatically, shortness of breath has improved with chest tub placement.  He has left chest wall and pleuritic pain.  He has lost 10 pounds.  Plan:   1.  Left hemothorax  Imaging studies reveal extensive pleural based disease as well as thoracic adenopathy, upper abdominal adenopathy, and skeletal metastasis.  Het has a 50 pack year smoking history and a history of exposure to asbestos.  Differential diagnosis includes metastatic lung cancer vs metastatic mesothelioma.  Discuss plan for biopsy for diagnosis.     Per patient, biopsy is being scheduled for possibly tomorrow.  Preliminary discussion regarding treatment of metastatic mesothelioma vs lung cancer.   2.   Normocytic anemia  Etiology secondary to hemothorax.    Patient denies any GI bleeding.  Serial HCT/Hgb.  Transfuse as needed. 3.   Renal insufficiency  Creatinine 2.75 on admission.  Baseline creatinine 0.85 - 1.12.  Anticipate creatinine will improve with fluid resuscitation.  4.  Cancer-related pain  Patient states that he was taking  oxycodone 5 mg po q 4 hours prn pain with adequate relief.   Thank you for allowing me to participate in ZACKRY DEINES 's care.  I will follow him closely with you while hospitalized and after discharge in the outpatient department.  Lequita Asal, MD  06/22/2020, 8:16 PM

## 2020-06-22 NOTE — Telephone Encounter (Signed)
Called pt's wife and advised ED with pt's worsening symptoms. Expressed understanding.

## 2020-06-22 NOTE — ED Triage Notes (Signed)
Pt to ED via [facility] from [place]. Pt was being seen for consultation and was found to be hypotensive. Pt is tachy at 103 and hypotensive at 64/41 in triage. Pt states that he feels short of breath. Pt is currently on 2 liter of O2 via [place], does not wear O2 at home.

## 2020-06-23 ENCOUNTER — Ambulatory Visit: Payer: Medicare Other | Admitting: Physical Therapy

## 2020-06-23 ENCOUNTER — Telehealth: Payer: Self-pay | Admitting: Pulmonary Disease

## 2020-06-23 ENCOUNTER — Ambulatory Visit (HOSPITAL_COMMUNITY): Admission: RE | Admit: 2020-06-23 | Payer: Medicare Other | Source: Ambulatory Visit

## 2020-06-23 DIAGNOSIS — J948 Other specified pleural conditions: Secondary | ICD-10-CM

## 2020-06-23 DIAGNOSIS — N401 Enlarged prostate with lower urinary tract symptoms: Secondary | ICD-10-CM | POA: Diagnosis not present

## 2020-06-23 DIAGNOSIS — Z9689 Presence of other specified functional implants: Secondary | ICD-10-CM

## 2020-06-23 DIAGNOSIS — J9601 Acute respiratory failure with hypoxia: Secondary | ICD-10-CM

## 2020-06-23 DIAGNOSIS — L8990 Pressure ulcer of unspecified site, unspecified stage: Secondary | ICD-10-CM | POA: Insufficient documentation

## 2020-06-23 DIAGNOSIS — Z515 Encounter for palliative care: Secondary | ICD-10-CM

## 2020-06-23 DIAGNOSIS — J942 Hemothorax: Secondary | ICD-10-CM | POA: Diagnosis not present

## 2020-06-23 DIAGNOSIS — C7951 Secondary malignant neoplasm of bone: Secondary | ICD-10-CM

## 2020-06-23 DIAGNOSIS — Z7189 Other specified counseling: Secondary | ICD-10-CM

## 2020-06-23 DIAGNOSIS — I10 Essential (primary) hypertension: Secondary | ICD-10-CM | POA: Diagnosis not present

## 2020-06-23 LAB — CBC
HCT: 31.9 % — ABNORMAL LOW (ref 39.0–52.0)
HCT: 28.8 % — ABNORMAL LOW (ref 39.0–52.0)
HCT: 29.2 % — ABNORMAL LOW (ref 39.0–52.0)
Hemoglobin: 10.9 g/dL — ABNORMAL LOW (ref 13.0–17.0)
Hemoglobin: 9.9 g/dL — ABNORMAL LOW (ref 13.0–17.0)
Hemoglobin: 10 g/dL — ABNORMAL LOW (ref 13.0–17.0)
MCH: 31.1 pg (ref 26.0–34.0)
MCH: 30.8 pg (ref 26.0–34.0)
MCH: 30.8 pg (ref 26.0–34.0)
MCHC: 34.2 g/dL (ref 30.0–36.0)
MCHC: 34.4 g/dL (ref 30.0–36.0)
MCHC: 34.2 g/dL (ref 30.0–36.0)
MCV: 91.1 fL (ref 80.0–100.0)
MCV: 89.7 fL (ref 80.0–100.0)
MCV: 89.8 fL (ref 80.0–100.0)
Platelets: 174 10*3/uL (ref 150–400)
Platelets: 185 10*3/uL (ref 150–400)
Platelets: 194 10*3/uL (ref 150–400)
RBC: 3.5 MIL/uL — ABNORMAL LOW (ref 4.22–5.81)
RBC: 3.21 MIL/uL — ABNORMAL LOW (ref 4.22–5.81)
RBC: 3.25 MIL/uL — ABNORMAL LOW (ref 4.22–5.81)
RDW: 13.2 % (ref 11.5–15.5)
RDW: 12.7 % (ref 11.5–15.5)
RDW: 12.8 % (ref 11.5–15.5)
WBC: 7.4 10*3/uL (ref 4.0–10.5)
WBC: 7 10*3/uL (ref 4.0–10.5)
WBC: 6.5 10*3/uL (ref 4.0–10.5)
nRBC: 0 % (ref 0.0–0.2)
nRBC: 0 % (ref 0.0–0.2)
nRBC: 0 % (ref 0.0–0.2)

## 2020-06-23 LAB — BASIC METABOLIC PANEL
Anion gap: 9 (ref 5–15)
BUN: 50 mg/dL — ABNORMAL HIGH (ref 8–23)
CO2: 21 mmol/L — ABNORMAL LOW (ref 22–32)
Calcium: 8.6 mg/dL — ABNORMAL LOW (ref 8.9–10.3)
Chloride: 105 mmol/L (ref 98–111)
Creatinine, Ser: 1.36 mg/dL — ABNORMAL HIGH (ref 0.61–1.24)
GFR calc Af Amer: 59 mL/min — ABNORMAL LOW (ref >60)
GFR calc non Af Amer: 51 mL/min — ABNORMAL LOW (ref >60)
Glucose, Bld: 84 mg/dL (ref 70–99)
Potassium: 5.1 mmol/L (ref 3.5–5.1)
Sodium: 135 mmol/L (ref 135–145)

## 2020-06-23 LAB — TYPE AND SCREEN
ABO/RH(D): A POS
Antibody Screen: NEGATIVE
Unit division: 0

## 2020-06-23 LAB — GLUCOSE, CAPILLARY: Glucose-Capillary: 77 mg/dL (ref 70–99)

## 2020-06-23 LAB — PREPARE RBC (CROSSMATCH)

## 2020-06-23 LAB — BPAM RBC
Blood Product Expiration Date: 202110302359
ISSUE DATE / TIME: 202110042156
Unit Type and Rh: 6200

## 2020-06-23 LAB — MRSA PCR SCREENING: MRSA by PCR: NEGATIVE

## 2020-06-23 MED ORDER — INFLUENZA VAC A&B SA ADJ QUAD 0.5 ML IM PRSY
0.5000 mL | PREFILLED_SYRINGE | INTRAMUSCULAR | Status: AC
  Administered 2020-06-26: 0.5 mL via INTRAMUSCULAR
  Filled 2020-06-23 (×2): qty 0.5

## 2020-06-23 MED ORDER — FENTANYL CITRATE (PF) 100 MCG/2ML IJ SOLN: 25.0000 ug | Freq: Once | INTRAMUSCULAR | Status: DC

## 2020-06-23 MED ORDER — FENTANYL CITRATE (PF) 100 MCG/2ML IJ SOLN
25.0000 ug | INTRAMUSCULAR | Status: DC | PRN
  Administered 2020-06-24 (×3): 25 ug via INTRAVENOUS
  Filled 2020-06-23 (×3): qty 2

## 2020-06-23 MED ORDER — CHLORHEXIDINE GLUCONATE CLOTH 2 % EX PADS: 6.0000 | MEDICATED_PAD | Freq: Every day | CUTANEOUS | Status: DC

## 2020-06-23 MED ORDER — OXYCODONE HCL 5 MG PO TABS
5.0000 mg | ORAL_TABLET | ORAL | Status: DC | PRN
  Administered 2020-06-23 – 2020-06-25 (×8): 5 mg via ORAL
  Filled 2020-06-23 (×8): qty 1

## 2020-06-23 MED ORDER — MORPHINE SULFATE (PF) 2 MG/ML IV SOLN
2.0000 mg | INTRAVENOUS | Status: DC | PRN
  Administered 2020-06-23: 2 mg via INTRAVENOUS
  Filled 2020-06-23: qty 1

## 2020-06-23 NOTE — Consult Note (Signed)
NAME:  Dwayne Thompson, MRN:  [id number], DOB:  15-May-1946, LOS: 1 ADMISSION DATE:  06/22/2020, CONSULTATION DATE:  06/23/20 REFERRING MD:  Max Sane, CHIEF COMPLAINT:  Bloody pleura effusion/hemothroax  Brief History   This is a 74 yo with history BPH, cirrhosis, COPD, ED, GERD, HLD and pleural effusion. Presents with hemothorax noted in ED for which chest tube placed.   History of present illness   74 year old former smoker, last saw him on 08 June 2020 due to a recurrent left pleural effusion. Previously the patient had been seen for abnormal low-dose CT chest for lung cancer screening for issues that were on the RIGHT lung. These issues resolved completely. We last saw him June 2020 and then he was lost to follow-up. In around 26 April 2020 he developed issues with left flank pain. Evaluation eventually yielded the finding of a left pleural effusion. He underwent thoracentesis on 1 September which showed bloody fluid however cytology was negative and cultures were negative. The patient presented for evaluation on 16 September as noted above. He had recurrent intense pleuritic pain and the effusion had recurred. The patient had been treated with antibiotics and prednisone without relief. Repeat thoracentesis was performed on 17 September this was followed by imaging with CT. The CT of the chest has been reviewed independently. This shows pleural thickening and nodularity with a pleural-based mass and central adenopathy versus mass as well. There is also increasing size of upper abdominal lymph nodes suspicious for disease below the diaphragm. The findings are concerning for possible mesothelioma. The pleural fluid chemistry is consistent with exudative effusion. The fluid was also serosanguineous concerning for carcinoma.  Unfortunately cytology has been negative due to the intense pleural reaction.  He underwent PET/CT on 24 September that showed extensive disease in the chest with  pleural effusion and pleural-based mass that was intensely FDG avid.  He was scheduled for CT-guided biopsy of the pleural-based mass tomorrow at [facility].  Scheduling has been done at [facility] due to current IR staffing shortages.  Today he was seen in consultation by Dr. Marta Lamas, on evaluation in his office the patient was exceedingly tachypneic, hypoxic and hypotensive.  He was referred to the emergency room and a large pleural effusion opacifying the left chest was noted.  Chest tube was placed in the ED.  Patient had a hemothorax.  It was noted that his H&H had dropped at least 3 g from prior.  He is also noted to have acute kidney injury.   He is currently in the emergency room awaiting admission.  Oxygenation has improved tremendously since chest tube placed and hemothorax evacuated.  The patient voices no new complaint except for that noted prior with his of intense pleurodynia.  His shortness of breath markedly improved from prior.  Voices no other complaint.  Past Medical History  BPH Back pain Cirrhosis COPD GERD HTN HLD   Significant Hospital Events   10/4 chest tube  Consults:  PCCM  Procedures:  10/5 chest tube  Significant Diagnostic Tests:  Reviewed PET scan with pleural based pet avid lesion. Also with Station 7 LN  Micro Data:  NA  Antimicrobials:  NA  Interim history/subjective:  10/5-Patient remains with chest tube to suction of 20. 670 cc's of bloody output since placement on 10/4. Has received 1 unit of bloody. Bloody output has slowed.   Objective   Blood pressure 109/71, pulse 92, temperature 98 F (36.7 C), temperature source Oral, resp. rate 16, height  5\' 11"  (1.803 m), weight 86.8 kg, SpO2 100 %.        Intake/Output Summary (Last 24 hours) at 06/23/2020 1151 Last data filed at 06/23/2020 0900 Gross per 24 hour  Intake --  Output 3225 ml  Net -3225 ml   Filed Weights   06/22/20 1112 06/23/20 0545  Weight: 76.2 kg 86.8 kg     Examination: General: Patient alert and interactive. Complaining of chest tube site pain HENT: Moist mucous membranes Lungs: Clear breath sounds noted Cardiovascular: RRR Abdomen: Soft non tender and non distended Extremities: No gross deformities noted Neuro: AOx3 GU: Deferered  Resolved Hospital Problem list   NA  Assessment & Plan:  This is a 74 yo with history as noted above who presentes for worsening respiratory status in context of known effusion that is highly suspicious for malignancy but cytology negative x2 thus far  Actue respiratory failure 2/2 hypoxia-SP chest tube placements -Continue to monitor H and H q8. -Monitor ouptut from chest tube which has slowed. Continue 20 of suction -Wean for sats fo greater than 90 -Low dose fentanyl for anlagesia   Pleural mass that is PET avid -IR to biopsy tomorrow -Monitor H and H -Consider EBUS if unable to attain for some reason or antoher  Acute on chronic renal failure-Improved significantly  overnight Creatinine closer to baseline of 1.10. IT is 1.36 today. From 2.75 previously -Continue to monitor UOP and avoid nephrotoxins        Labs   CBC: Recent Labs  Lab 06/17/20 1316 06/22/20 1118 06/22/20 1618 06/22/20 2107 06/23/20 0507 06/23/20 1111  WBC 8.7 9.8  --   --  7.4 7.0  NEUTROABS 6.7  --   --   --   --   --   HGB 12.9* 9.8* 11.0* 9.3* 10.9* 9.9*  HCT 38.4 29.1* 32.2* 25.8* 31.9* 28.8*  MCV 92 90.9  --   --  91.1 89.7  PLT 174 220  --   --  174 660    Basic Metabolic Panel: Recent Labs  Lab 06/17/20 1316 06/22/20 1118 06/23/20 0507  NA 138 131* 135  K 5.2 5.5* 5.1  CL 101 100 105  CO2 18* 21* 21*  GLUCOSE 81 106* 84  BUN 51* 70* 50*  CREATININE 1.98* 2.75* 1.36*  CALCIUM 9.1 8.9 8.6*   GFR: Estimated Creatinine Clearance: 50.8 mL/min (A) (by C-G formula based on SCr of 1.36 mg/dL (H)). Recent Labs  Lab 06/17/20 1316 06/22/20 1118 06/23/20 0507 06/23/20 1111  WBC 8.7 9.8 7.4  7.0    Liver Function Tests: Recent Labs  Lab 06/17/20 1316  AST 22  ALT 21  ALKPHOS 67  BILITOT 0.3  PROT 6.2  ALBUMIN 3.4*   No results for input(s): LIPASE, AMYLASE in the last 168 hours. No results for input(s): AMMONIA in the last 168 hours.  ABG No results found for: PHART, PCO2ART, PO2ART, HCO3, TCO2, ACIDBASEDEF, O2SAT   Coagulation Profile: Recent Labs  Lab 06/22/20 1404  INR 1.2    Cardiac Enzymes: No results for input(s): CKTOTAL, CKMB, CKMBINDEX, TROPONINI in the last 168 hours.  HbA1C: Hemoglobin A1C  Date/Time Value Ref Range Status  05/22/2018 12:16 PM 5.1 4.0 - 5.6 % Final    CBG: Recent Labs  Lab 06/23/20 0541  GLUCAP 77    Review of Systems:   No increase in WOB. No cough. Some pain. No fever  Past Medical History  He,  has a past medical history of Anemia,  Back pain, BPH (benign prostatic hyperplasia), Cirrhosis ([place]), COPD (chronic obstructive pulmonary disease) ([place]), ED (erectile dysfunction), GERD (gastroesophageal reflux disease), Hyperlipidemia, and Hypertension.   Surgical History    Past Surgical History:  Procedure Laterality Date  . COLONOSCOPY Left 01/03/2018   Procedure: COLONOSCOPY;  Surgeon: Virgel Manifold, MD;  Location: [facility] ENDOSCOPY;  Service: Endoscopy;  Laterality: Left;  . COLONOSCOPY WITH PROPOFOL N/A 04/05/2018   Procedure: COLONOSCOPY WITH PROPOFOL;  Surgeon: Virgel Manifold, MD;  Location: [facility] ENDOSCOPY;  Service: Endoscopy;  Laterality: N/A;  . CYSTOSCOPY WITH STENT PLACEMENT Right 09/26/2019   Procedure: CYSTOSCOPY WITH STENT PLACEMENT;  Surgeon: Royston Cowper, MD;  Location: [facility] ORS;  Service: Urology;  Laterality: Right;  . ENTEROSCOPY  01/03/2018   Procedure: ENTEROSCOPY;  Surgeon: Virgel Manifold, MD;  Location: [facility] ENDOSCOPY;  Service: Endoscopy;;  . ESOPHAGOGASTRODUODENOSCOPY N/A 01/02/2018   Procedure: ESOPHAGOGASTRODUODENOSCOPY (EGD);  Surgeon: Virgel Manifold, MD;  Location: [facility]  ENDOSCOPY;  Service: Endoscopy;  Laterality: N/A;  . ESOPHAGOGASTRODUODENOSCOPY (EGD) WITH PROPOFOL N/A 04/05/2018   Procedure: ESOPHAGOGASTRODUODENOSCOPY (EGD) WITH PROPOFOL;  Surgeon: Virgel Manifold, MD;  Location: [facility] ENDOSCOPY;  Service: Endoscopy;  Laterality: N/A;  . exploration and neurolysis of right peroneal nerve at the knee Right 08/27/2018  . GREEN LIGHT LASER TURP (TRANSURETHRAL RESECTION OF PROSTATE N/A 09/26/2019   Procedure: GREEN LIGHT LASER TURP (TRANSURETHRAL RESECTION OF PROSTATE;  Surgeon: Royston Cowper, MD;  Location: [facility] ORS;  Service: Urology;  Laterality: N/A;  . HERNIA REPAIR     1980's     Social History   reports that he quit smoking about 6 years ago. His smoking use included cigarettes. He has a 50.00 pack-year smoking history. He has never used smokeless tobacco. He reports current alcohol use of about 21.0 - 28.0 standard drinks of alcohol per week. He reports that he does not use drugs.   Family History   His family history includes Alzheimer's disease in his mother; Anxiety disorder in his brother; Sarcoidosis in his sister. There is no history of Kidney disease or Prostate cancer.   Allergies Allergies  Allergen Reactions  . Indomethacin Swelling    ankle swelling  . Tamsulosin Hcl Hives  . Sulfa Antibiotics Rash     Home Medications  Prior to Admission medications   Medication Sig Start Date End Date Taking? Authorizing Provider  aspirin 81 MG chewable tablet Chew by mouth daily.   Yes [provider]  cholecalciferol (VITAMIN D3) 25 MCG (1000 UNIT) tablet Take 1,000 Units by mouth daily.   Yes [provider]  dutasteride (AVODART) 0.5 MG capsule Take 0.5 mg by mouth daily.   Yes [provider]  folic acid (FOLVITE) 1 MG tablet Take 1 tablet (1 mg total) by mouth daily. 12/15/18  Yes Corcoran, Drue Second, MD  HYDROcodone-acetaminophen (NORCO) 10-325 MG tablet Take 1 tablet by mouth every 4 (four) hours as needed.  06/17/20  Yes Jerrol Banana., MD  montelukast (SINGULAIR) 10 MG tablet TAKE 1 TABLET BY MOUTH EVERY DAY Patient taking differently: Take 10 mg by mouth daily.  05/12/20  Yes Jerrol Banana., MD  pantoprazole (PROTONIX) 20 MG tablet Take 20 mg by mouth 2 (two) times daily.   Yes [provider]  potassium chloride (K-DUR,KLOR-CON) 10 MEQ tablet Take 1 tablet (10 mEq total) by mouth 2 (two) times daily. 12/21/18  Yes Jerrol Banana., MD  rosuvastatin (CRESTOR) 10 MG tablet Take 1 tablet (10 mg  total) by mouth daily. 10/28/19  Yes Jerrol Banana., MD  vitamin B-12 1000 MCG tablet Take 1 tablet (1,000 mcg total) by mouth daily. 01/05/18  Yes Salary, Avel Peace, MD  acetaminophen (TYLENOL) 325 MG tablet Take 2 tablets (650 mg total) by mouth 3 (three) times daily. Patient taking differently: Take 650 mg by mouth every 6 (six) hours as needed for moderate pain.  01/04/18   Salary, Avel Peace, MD  AMBULATORY NON FORMULARY MEDICATION Trimix (30 mg PAPA, 1 mL phen, 50 mg prostaglandin)   Vial 67ml   Dose 0.11mcg  Qty 10 #6 refills  [place] [phone] Fax [phone] 07/19/19   [name] A, PA-C  cetirizine (ZYRTEC) 10 MG tablet Take 1 tablet (10 mg total) by mouth daily. 01/30/20   Jerrol Banana., MD  diclofenac Sodium (VOLTAREN) 1 % GEL Apply 1 application topically 4 (four) times daily as needed (pain).    [provider]  doxazosin (CARDURA) 2 MG tablet Take 1 tablet (2 mg total) by mouth daily. Patient not taking: Reported on 06/18/2020 04/24/20 06/18/20  Trinna Post, PA-C  oxyCODONE (ROXICODONE) 5 MG immediate release tablet Take 1 tablet (5 mg total) by mouth every 4 (four) hours as needed for severe pain. 06/18/20   Jerrol Banana., MD  tamsulosin (FLOMAX) 0.4 MG CAPS capsule Take 0.4 mg by mouth daily.    [provider]     Critical care time: 35 minutes

## 2020-06-23 NOTE — Progress Notes (Signed)
1       PROGRESS NOTE    CRUE OTERO  JJO:[id number] DOB: Apr 14, 1946 DOA: 06/22/2020 PCP: Jerrol Banana., MD   Brief Narrative:   Dwayne Thompson is a 74 y.o. male with medical history significant for liver cirrhosis, BPH, COPD, hypertension and chronic low back pain sent over from the cardiothoracic surgeons office for evaluation of hypotension and tachycardia.  Patient has a history of mesothelioma as well as recurrent left pleural effusions and is status post 2 prior thoracentesis with drainage of serosanguineous fluid.  He was scheduled to see the cardiothoracic surgeon today but states that he developed worsening shortness of breath about 2 days prior to presentation.  When he arrived at the office patient was noted to be hypotensive and tachycardic.  He was also hypoxic with room air pulse oximetry in the 80s. He has had chest pain mostly over the left anterior chest wall.   ED Course: Patient had a chest x-ray which showed a left pleural effusion and status post pigtail insertion with drainage of frank blood.  Patient has had about 2.5 L of bloody drainage from the pigtail.  He continues to require oxygen at 4 L to maintain pulse oximetry greater than 92% and will be admitted to the hospital for further evaluation   Assessment & Plan:   Principal Problem:   Acute respiratory failure ([place]) Active Problems:   BPH with obstruction/lower urinary tract symptoms   Thrombocytopenia (HCC)   Hypertension   Cirrhosis ([place])   Hemothorax on left   Pleural mass   Bone metastasis (HCC)   Pressure injury of skin   Hemothorax  Acute hypoxic respiratory failure Most likely secondary to hemothorax Status post chest tube placement Fentanyl for pain as needed  Acute kidney injury Likely prerenal Now improving with hydration. Patient has a baseline serum creatinine of 0.85 on admission it was 2.75-> 1.3  Hemothorax, Left Unclear etiology Patient denies any trauma  May be related to  underlying ??malignancy He has a chest tube in place and has drained about 2.5 L of bloody fluid CT-guided biopsy scheduled for tomorrow under IR  Hyperkalemia Most likely iatrogenic and secondary to potassium supplements Will treat with dextrose and insulin Now resolved  GERD Continue Protonix  BPH Continue Flomax and Avodart  History of liver cirrhosis Stable  Pleural-based mass: Possible metastatic mesothelioma versus metastatic lung cancer -IR guided pleural biopsy tomorrow for diagnosis Oncology following  Anemia of chronic disease Patient has a baseline hemoglobin of about 12.9g/dl and on admission he was noted to have a drop in his hemoglobin to 9 g/dl He has had about 2.5 L of blood drained from his left chest We will monitor H&H closely  Body mass index is 26.69 kg/m.  Pressure Injury 06/23/20 Buttocks Right Stage 2 -  Partial thickness loss of dermis presenting as a shallow open injury with a red, pink wound bed without slough. 2.5 x 1.2 (Active)  06/23/20 0608  Location: Buttocks  Location Orientation: Right  Staging: Stage 2 -  Partial thickness loss of dermis presenting as a shallow open injury with a red, pink wound bed without slough.  Wound Description (Comments): 2.5 x 1.2  Present on Admission: Yes     DVT prophylaxis:  SCDs Start: 06/22/20 1659     Code Status: Full Code Family Communication: Discussed with patient Disposition Plan: Depending on diagnostic work-up and is clinical condition hopefully next 2 to 3 days Status is: Inpatient  Remains inpatient  appropriate because:Ongoing diagnostic testing needed not appropriate for outpatient work up   Dispo: The patient is from: Home              Anticipated d/c is to: Home              Anticipated d/c date is: 3 days              Patient currently is not medically stable to d/c.       Consultants:   Oncology  CT surgery  PCCM   Procedures: IR guided pleural biopsy  scheduled for tomorrow 10/6   Subjective: Requesting pain medicine as pain is not controlled.  Objective: Vitals:   06/23/20 1315 06/23/20 1330 06/23/20 1345 06/23/20 1400  BP: 94/63 97/65 94/63  91/65  Pulse: 95 92 92 92  Resp: 17 18 18 19   Temp:      TempSrc:      SpO2: 100% 100% 100% 100%  Weight:      Height:        Intake/Output Summary (Last 24 hours) at 06/23/2020 1447 Last data filed at 06/23/2020 1341 Gross per 24 hour  Intake 2342.6 ml  Output 3485 ml  Net -1142.4 ml   Filed Weights   06/22/20 1112 06/23/20 0545  Weight: 76.2 kg 86.8 kg    Examination:  General exam: Appears calm and comfortable  Respiratory system: Clear to auscultation. Respiratory effort normal.  Left-sided chest tube in place draining serosanguineous drainage Cardiovascular system: S1 & S2 heard, RRR. No JVD, murmurs, rubs, gallops or clicks. No pedal edema.   Gastrointestinal system: Abdomen is nondistended, soft and nontender. No organomegaly or masses felt. Normal bowel sounds heard. Central nervous system: Alert and oriented. No focal neurological deficits. Extremities: Symmetric 5 x 5 power. Skin: No rashes, lesions or ulcers Psychiatry: Judgement and insight appear normal. Mood & affect appropriate.     Data Reviewed: I have personally reviewed following labs and imaging studies  CBC: Recent Labs  Lab 06/17/20 1316 06/22/20 1118 06/22/20 1618 06/22/20 2107 06/23/20 0507 06/23/20 1111  WBC 8.7 9.8  --   --  7.4 7.0  NEUTROABS 6.7  --   --   --   --   --   HGB 12.9* 9.8* 11.0* 9.3* 10.9* 9.9*  HCT 38.4 29.1* 32.2* 25.8* 31.9* 28.8*  MCV 92 90.9  --   --  91.1 89.7  PLT 174 220  --   --  174 962   Basic Metabolic Panel: Recent Labs  Lab 06/17/20 1316 06/22/20 1118 06/23/20 0507  NA 138 131* 135  K 5.2 5.5* 5.1  CL 101 100 105  CO2 18* 21* 21*  GLUCOSE 81 106* 84  BUN 51* 70* 50*  CREATININE 1.98* 2.75* 1.36*  CALCIUM 9.1 8.9 8.6*   GFR: Estimated Creatinine  Clearance: 50.8 mL/min (A) (by C-G formula based on SCr of 1.36 mg/dL (H)). Liver Function Tests: Recent Labs  Lab 06/17/20 1316  AST 22  ALT 21  ALKPHOS 67  BILITOT 0.3  PROT 6.2  ALBUMIN 3.4*   No results for input(s): LIPASE, AMYLASE in the last 168 hours. No results for input(s): AMMONIA in the last 168 hours. Coagulation Profile: Recent Labs  Lab 06/22/20 1404  INR 1.2   Cardiac Enzymes: No results for input(s): CKTOTAL, CKMB, CKMBINDEX, TROPONINI in the last 168 hours. BNP (last 3 results) No results for input(s): PROBNP in the last 8760 hours. HbA1C: No results for input(s): HGBA1C in the  last 72 hours. CBG: Recent Labs  Lab 06/23/20 0541  GLUCAP 77   Lipid Profile: No results for input(s): CHOL, HDL, LDLCALC, TRIG, CHOLHDL, LDLDIRECT in the last 72 hours. Thyroid Function Tests: No results for input(s): TSH, T4TOTAL, FREET4, T3FREE, THYROIDAB in the last 72 hours. Anemia Panel: No results for input(s): VITAMINB12, FOLATE, FERRITIN, TIBC, IRON, RETICCTPCT in the last 72 hours. Sepsis Labs: No results for input(s): PROCALCITON, LATICACIDVEN in the last 168 hours.  Recent Results (from the past 240 hour(s))  SARS CORONAVIRUS 2 (TAT 6-24 HRS) Nasopharyngeal Nasopharyngeal Swab     Status: None   Collection Time: 06/20/20  3:04 PM   Specimen: Nasopharyngeal Swab  Result Value Ref Range Status   SARS Coronavirus 2 NEGATIVE NEGATIVE Final    Comment: (NOTE) SARS-CoV-2 target nucleic acids are NOT DETECTED.  The SARS-CoV-2 RNA is generally detectable in upper and lower respiratory specimens during the acute phase of infection. Negative results do not preclude SARS-CoV-2 infection, do not rule out co-infections with other pathogens, and should not be used as the sole basis for treatment or other patient management decisions. Negative results must be combined with clinical observations, patient history, and epidemiological information. The expected result is  Negative.  Fact Sheet for Patients: SugarRoll.be  Fact Sheet for Healthcare Providers: https://www.woods-mathews.com/  This test is not yet approved or cleared by the [place] FDA and  has been authorized for detection and/or diagnosis of SARS-CoV-2 by FDA under an Emergency Use Authorization (EUA). This EUA will remain  in effect (meaning this test can be used) for the duration of the COVID-19 declaration under Se ction 564(b)(1) of the Act, 21 U.S.C. section 360bbb-3(b)(1), unless the authorization is terminated or revoked sooner.  Performed at [facility], [place] [address], [place], [place] [address]   MRSA PCR Screening     Status: None   Collection Time: 06/23/20  5:46 AM   Specimen: Nasopharyngeal  Result Value Ref Range Status   MRSA by PCR NEGATIVE NEGATIVE Final    Comment:        The GeneXpert MRSA Assay (FDA approved for NASAL specimens only), is one component of a comprehensive MRSA colonization surveillance program. It is not intended to diagnose MRSA infection nor to guide or monitor treatment for MRSA infections. Performed at [facility], [address], [place], [place] [address]          Radiology Studies: DG Chest 2 View  Result Date: 06/22/2020 CLINICAL DATA:  Shortness of breath.  Hypotensive. EXAM: CHEST - 2 VIEW COMPARISON:  06/17/2020 FINDINGS: Complete opacification of the left lung with rightward shift of the mediastinum. Linear opacities in the right lung base. No visible pneumothorax. Cardiomediastinal silhouette is largely obscured. No acute osseous abnormality. IMPRESSION: 1. Complete opacification of the left lung, likely related to a large pleural effusion (increased from prior) and possibly overlying consolidation. There is rightward mediastinal shift. Correlate for evidence of tension physiology. 2. Linear opacities in the right lung base, which could represent atelectasis,  aspiration, and/or pneumonia. Electronically Signed   By: Margaretha Sheffield MD   On: 06/22/2020 11:45   DG Chest Port 1 View  Result Date: 06/22/2020 CLINICAL DATA:  Postop check.  Chest tube placement. EXAM: PORTABLE CHEST 1 VIEW COMPARISON:  Radiograph earlier this day.  Most recent CT 06/06/2019 FINDINGS: Pigtail catheter projects over the left mid lung. No visualized pneumothorax. Improved hazy opacity at the left lung base consistent with decreasing pleural effusion and associated  airspace disease/atelectasis. Small volume of pleural fluid and or thickening persists. Slight increase in right infrahilar atelectasis. Stable heart size and mediastinal contours. IMPRESSION: 1. Left pigtail catheter in place. No visualized pneumothorax. 2. Improved hazy opacity at the left lung base consistent with decreasing pleural effusion and associated airspace disease/atelectasis. Small volume of pleural fluid and or thickening persists. Electronically Signed   By: Keith Rake M.D.   On: 06/22/2020 15:44   DG Chest Portable 1 View  Result Date: 06/22/2020 CLINICAL DATA:  Chest tube placement EXAM: PORTABLE CHEST 1 VIEW COMPARISON:  June 22, 2020 study obtained earlier in the day FINDINGS: Chest tube present on the left with considerable diminution in size of left pleural effusion. There is a fairly small residual left pleural effusion with patchy atelectasis in the left mid and lower lung regions. Lungs elsewhere are clear. Heart is upper normal in size with pulmonary vascularity normal. No adenopathy. No pneumothorax. No bone lesions. IMPRESSION: Left pleural effusion considerably smaller after chest tube placement. There is a fairly small persistent left pleural effusion with atelectatic change in the left mid and lower lung regions. Lungs otherwise clear. Heart upper normal in size. Electronically Signed   By: Lowella Grip III M.D.   On: 06/22/2020 13:49        Scheduled Meds: . sodium chloride    Intravenous Once  . Chlorhexidine Gluconate Cloth  6 each Topical Daily  . cholecalciferol  1,000 Units Oral Daily  . dutasteride  0.5 mg Oral Daily  . fentaNYL (SUBLIMAZE) injection  25 mcg Intravenous Once  . folic acid  1 mg Oral Daily  . [START ON 06/24/2020] influenza vaccine adjuvanted  0.5 mL Intramuscular Tomorrow-1000  . loratadine  10 mg Oral Daily  . montelukast  10 mg Oral Daily  . pantoprazole  20 mg Oral BID  . rosuvastatin  10 mg Oral Daily  . tamsulosin  0.4 mg Oral Daily  . cyanocobalamin  1,000 mcg Oral Daily   Continuous Infusions: . sodium chloride 125 mL/hr at 06/23/20 1344     LOS: 1 day    Time spent: 25 minutes    Max Sane, MD [facility] Pager [phone]  If 7PM-7AM, please contact night-coverage [email or web address] Password [facility] 06/23/2020, 2:47 PM

## 2020-06-23 NOTE — Progress Notes (Signed)
Pt A&OX4 this shift. Symmetrical. PRN oxycodone for pain. Morphine administered for breakthrough pain, BP became soft. Per MD continue to monitor as pt is asymptomatic. BP cuff moved to L ankle and Bps have been adequate since. Weaned to room air and is tolerating well. Wife at bedside. To be NPO at midnight for CT biopsy.

## 2020-06-23 NOTE — ED Notes (Signed)
Pt brief and bedding changed

## 2020-06-23 NOTE — Progress Notes (Signed)
Chief Complaint: Patient was seen in consultation today for left pleural biopsy   Referring Physician(s): Dr. Genevive Bi Dr. Patsey Berthold  Supervising Physician: Corrie Mckusick  Patient Status: [facility] - In-pt  History of Present Illness: Dwayne Thompson is a 74 y.o. male who was in the process of being worked up for suspected metastatic process with hypermetabolic pulmonary and pleural masses as well as bone lesions. He's had several thoracentesis but cytology has been negative. He was scheduled for image guided bx of pleural lesion but had to be admitted yesterday for hypoxia/SOB due to large recurrent left hemothorax/effusion. Pigtail chest tube was placed. He is much improved since admission. IR is asked to proceed with bx during this admission. PMHx, meds, labs, imaging, allergies reviewed.    Past Medical History:  Diagnosis Date  . Anemia   . Back pain   . BPH (benign prostatic hyperplasia)   . Cirrhosis ([place])   . COPD (chronic obstructive pulmonary disease) ([place])   . ED (erectile dysfunction)   . GERD (gastroesophageal reflux disease)   . Hyperlipidemia   . Hypertension     Past Surgical History:  Procedure Laterality Date  . COLONOSCOPY Left 01/03/2018   Procedure: COLONOSCOPY;  Surgeon: Virgel Manifold, MD;  Location: [facility] ENDOSCOPY;  Service: Endoscopy;  Laterality: Left;  . COLONOSCOPY WITH PROPOFOL N/A 04/05/2018   Procedure: COLONOSCOPY WITH PROPOFOL;  Surgeon: Virgel Manifold, MD;  Location: [facility] ENDOSCOPY;  Service: Endoscopy;  Laterality: N/A;  . CYSTOSCOPY WITH STENT PLACEMENT Right 09/26/2019   Procedure: CYSTOSCOPY WITH STENT PLACEMENT;  Surgeon: Royston Cowper, MD;  Location: [facility] ORS;  Service: Urology;  Laterality: Right;  . ENTEROSCOPY  01/03/2018   Procedure: ENTEROSCOPY;  Surgeon: Virgel Manifold, MD;  [facility] ENDOSCOPY;  Service: Endoscopy;;  . ESOPHAGOGASTRODUODENOSCOPY N/A 01/02/2018   Procedure: ESOPHAGOGASTRODUODENOSCOPY (EGD);   Surgeon: Virgel Manifold, MD;  Location: [facility] ENDOSCOPY;  Service: Endoscopy;  Laterality: N/A;  . ESOPHAGOGASTRODUODENOSCOPY (EGD) WITH PROPOFOL N/A 04/05/2018   Procedure: ESOPHAGOGASTRODUODENOSCOPY (EGD) WITH PROPOFOL;  Surgeon: Virgel Manifold, MD;  Location: [facility] ENDOSCOPY;  Service: Endoscopy;  Laterality: N/A;  . exploration and neurolysis of right peroneal nerve at the knee Right 08/27/2018  . GREEN LIGHT LASER TURP (TRANSURETHRAL RESECTION OF PROSTATE N/A 09/26/2019   Procedure: GREEN LIGHT LASER TURP (TRANSURETHRAL RESECTION OF PROSTATE;  Surgeon: Royston Cowper, MD;  Location: [facility] ORS;  Service: Urology;  Laterality: N/A;  . HERNIA REPAIR     1980's    Allergies: Indomethacin, Tamsulosin hcl, and Sulfa antibiotics  Medications:  Current Facility-Administered Medications:  .  0.9 %  sodium chloride infusion (Manually program via Guardrails IV Fluids), , Intravenous, Once, Ouma, Bing Neighbors, NP .  0.9 %  sodium chloride infusion, , Intravenous, Continuous, Agbata, Tochukwu, MD, Last Rate: 125 mL/hr at 06/23/20 1344, New Bag at 06/23/20 1344 .  acetaminophen (TYLENOL) tablet 650 mg, 650 mg, Oral, Q6H PRN, 650 mg at 06/23/20 1109 **OR** acetaminophen (TYLENOL) suppository 650 mg, 650 mg, Rectal, Q6H PRN, Agbata, Tochukwu, MD .  Chlorhexidine Gluconate Cloth 2 % PADS 6 each, 6 each, Topical, Daily, Ouma, Bing Neighbors, NP .  cholecalciferol (VITAMIN D3) tablet 1,000 Units, 1,000 Units, Oral, Daily, Agbata, Tochukwu, MD, 1,000 Units at 06/23/20 1109 .  diclofenac Sodium (VOLTAREN) 1 % topical gel 2 g, 2 g, Topical, QID PRN, Agbata, Tochukwu, MD .  dutasteride (AVODART) capsule 0.5 mg, 0.5 mg, Oral, Daily, Agbata, Tochukwu, MD, 0.5 mg at 06/23/20 1328 .  fentaNYL (  SUBLIMAZE) injection 25 mcg, 25 mcg, Intravenous, Once, Ouma, Bing Neighbors, NP .  fentaNYL (SUBLIMAZE) injection 25 mcg, 25 mcg, Intravenous, Q4H PRN, Max Sane, MD .  folic acid (FOLVITE) tablet 1  mg, 1 mg, Oral, Daily, Agbata, Tochukwu, MD, 1 mg at 06/23/20 1108 .  [START ON 06/24/2020] influenza vaccine adjuvanted (FLUAD) injection 0.5 mL, 0.5 mL, Intramuscular, Tomorrow-1000, Shah, Vipul, MD .  loratadine (CLARITIN) tablet 10 mg, 10 mg, Oral, Daily, Agbata, Tochukwu, MD, 10 mg at 06/23/20 1108 .  montelukast (SINGULAIR) tablet 10 mg, 10 mg, Oral, Daily, Agbata, Tochukwu, MD, 10 mg at 06/23/20 1109 .  ondansetron (ZOFRAN) tablet 4 mg, 4 mg, Oral, Q6H PRN **OR** ondansetron (ZOFRAN) injection 4 mg, 4 mg, Intravenous, Q6H PRN, Agbata, Tochukwu, MD .  oxyCODONE (Oxy IR/ROXICODONE) immediate release tablet 5 mg, 5 mg, Oral, Q4H PRN, Manuella Ghazi, Vipul, MD .  pantoprazole (PROTONIX) EC tablet 20 mg, 20 mg, Oral, BID, Agbata, Tochukwu, MD, 20 mg at 06/23/20 1329 .  rosuvastatin (CRESTOR) tablet 10 mg, 10 mg, Oral, Daily, Agbata, Tochukwu, MD, 10 mg at 06/23/20 1108 .  tamsulosin (FLOMAX) capsule 0.4 mg, 0.4 mg, Oral, Daily, Agbata, Tochukwu, MD, 0.4 mg at 06/23/20 1109 .  vitamin B-12 (CYANOCOBALAMIN) tablet 1,000 mcg, 1,000 mcg, Oral, Daily, Agbata, Tochukwu, MD, 1,000 mcg at 06/23/20 1108    Family History  Problem Relation Age of Onset  . Alzheimer's disease Mother   . Sarcoidosis Sister   . Anxiety disorder Brother   . Kidney disease Neg Hx   . Prostate cancer Neg Hx     Social History   Socioeconomic History  . Marital status: Married    Spouse name: [name]  . Number of children: 3  . Years of education: 30  . Highest education level: 12th grade  Occupational History  . Occupation: retired  Tobacco Use  . Smoking status: Former Smoker    Packs/day: 1.00    Years: 50.00    Pack years: 50.00    Types: Cigarettes    Quit date: 10/20/2013    Years since quitting: 6.6  . Smokeless tobacco: Never Used  Vaping Use  . Vaping Use: Never used  Substance and Sexual Activity  . Alcohol use: Yes    Alcohol/week: 21.0 - 28.0 standard drinks    Types: 21 - 28 Shots of liquor per week     Comment: 3-4 gin and sodas a day  . Drug use: No  . Sexual activity: Not Currently  Other Topics Concern  . Not on file  Social History Narrative  . Not on file   Social Determinants of Health   Financial Resource Strain: Low Risk   . Difficulty of Paying Living Expenses: Not hard at all  Food Insecurity: No Food Insecurity  . Worried About Charity fundraiser in the Last Year: Never true  . Ran Out of Food in the Last Year: Never true  Transportation Needs: No Transportation Needs  . Lack of Transportation (Medical): No  . Lack of Transportation (Non-Medical): No  Physical Activity: Insufficiently Active  . Days of Exercise per Week: 1 day  . Minutes of Exercise per Session: 60 min  Stress: No Stress Concern Present  . Feeling of Stress : Not at all  Social Connections: Moderately Integrated  . Frequency of Communication with Friends and Family: Three times a week  . Frequency of Social Gatherings with Friends and Family: Once a week  . Attends Religious Services: Never  . Active Member  of Clubs or Organizations: Yes  . Attends Archivist Meetings: 1 to 4 times per year  . Marital Status: Married     Review of Systems: A 12 point ROS discussed and pertinent positives are indicated in the HPI above.  All other systems are negative.  Review of Systems  Vital Signs: BP 90/79   Pulse 94   Temp 98.1 F (36.7 C) (Oral)   Resp 20   Ht 5\' 11"  (1.803 m)   Wt 86.8 kg   SpO2 100%   BMI 26.69 kg/m   Physical Exam Constitutional:      Appearance: Normal appearance.  HENT:     Mouth/Throat:     Mouth: Mucous membranes are moist.     Pharynx: Oropharynx is clear.  Cardiovascular:     Rate and Rhythm: Normal rate and regular rhythm.     Heart sounds: Normal heart sounds.  Pulmonary:     Effort: Pulmonary effort is normal. No respiratory distress.     Comments: Diminished left BS Chest tube intact Serosanguinous output Skin:    General: Skin is warm and dry.   Neurological:     General: No focal deficit present.     Mental Status: He is alert and oriented to person, place, and time.  Psychiatric:        Mood and Affect: Mood normal.        Thought Content: Thought content normal.        Judgment: Judgment normal.     Imaging: DG Chest 2 View  Result Date: 06/22/2020 CLINICAL DATA:  Shortness of breath.  Hypotensive. EXAM: CHEST - 2 VIEW COMPARISON:  06/17/2020 FINDINGS: Complete opacification of the left lung with rightward shift of the mediastinum. Linear opacities in the right lung base. No visible pneumothorax. Cardiomediastinal silhouette is largely obscured. No acute osseous abnormality. IMPRESSION: 1. Complete opacification of the left lung, likely related to a large pleural effusion (increased from prior) and possibly overlying consolidation. There is rightward mediastinal shift. Correlate for evidence of tension physiology. 2. Linear opacities in the right lung base, which could represent atelectasis, aspiration, and/or pneumonia. Electronically Signed   By: Margaretha Sheffield MD   On: 06/22/2020 11:45   DG Chest 2 View  Result Date: 06/18/2020 CLINICAL DATA:  Chest pain. Follow-up of left pleural effusion. Cough and shortness of breath. History of COPD, hypertension, former smoker. EXAM: CHEST - 2 VIEW COMPARISON:  05/27/2020 FINDINGS: Cardiac enlargement. Large left pleural effusion is increasing since previous study. Consolidation and volume loss in the left lung is also increasing. Patchy infiltrates and peribronchial thickening are developing in the right lung base. Scarring in the right apex. No visible pneumothorax. IMPRESSION: Increasing left pleural effusion and increasing consolidation and volume loss in the left lung. Developing infiltration and peribronchial thickening in the right lung base. Electronically Signed   By: Lucienne Capers M.D.   On: 06/18/2020 22:56   DG Chest 2 View  Result Date: 05/28/2020 CLINICAL DATA:  Follow-up  LEFT pleural effusion, recent thoracentesis, persistent pain on LEFT, former smoker, COPD, hypertension EXAM: CHEST - 2 VIEW COMPARISON:  05/20/2020 FINDINGS: Normal heart size, mediastinal contours, and pulmonary vascularity. Increased LEFT pleural effusion and basilar atelectasis. Remaining lungs clear. No pneumothorax or acute osseous findings. IMPRESSION: Increased LEFT pleural effusion and basilar atelectasis. Electronically Signed   By: Lavonia Dana M.D.   On: 05/28/2020 08:50   CT Chest Wo Contrast  Result Date: 06/05/2020 CLINICAL DATA:  Abnormal x-ray, pleural  effusion. EXAM: CT CHEST WITHOUT CONTRAST TECHNIQUE: Multidetector CT imaging of the chest was performed following the standard protocol without IV contrast. COMPARISON:  May 15, 2020 and thoracentesis of June 05, 2020 FINDINGS: Cardiovascular: Stable mild dilation of ascending thoracic aorta to approximately 3.8 cm. Central pulmonary vasculature approximately 3.3 cm. Calcified coronary artery disease. Limited assessment of cardiovascular structures given lack of intravenous contrast. No pericardial effusion. Mediastinum/Nodes: Subcarinal nodal enlargement approximately 15 mm on image 74 series 3, increased slightly in size since the study of May 15, 2020. No thoracic inlet adenopathy.  No axillary lymphadenopathy. Lungs/Pleura: Pulmonary emphysema.  Subpleural reticulation. Improved aeration following LEFT thoracentesis still with a nodular pattern in the central portion of the LEFT chest about bronchovascular structures. This is along the major fissure is associated with subpleural lymph node involvement. LEFT infrahilar nodal enlargement versus central pulmonary lesion suggested on image 91 and 93 of series 2 where there is soft tissue that is contiguous with septal thickening and nodularity extending to the periphery along bronchial structures and displacing bronchial structures medially. Some of these areas un data Benjie Karvonen represent  central pulmonary vasculature though the lobulated contour and appearance suggests nodal enlargement or central mass or nodule in addition to vascular structures in the central LEFT chest. Still with persistent pleural fluid. Juxtapleural nodule in the LEFT lung base (image 105 of series 2) 1.9 x 1.0 cm. Discrete nodule on image 95 series 2 could also represent loculated fluid in the major fissure. Bulky masslike area in the inferior costodiaphragmatic sulcus anteriorly on the LEFT measuring 3.1 x 4.3 cm (image 143 series 3. Numerous other areas of pleural nodularity, less pronounced with involvement of extrapleural fat in the LEFT chest and thickening that extends to the medial costodiaphragmatic sulcus on image 114 of series 3. This can also be seen tracking inferiorly into the inferior costodiaphragmatic sulcus abutting the anterior margin of the T12 vertebral level on the LEFT on image 140 of series 3 Upper Abdomen: Low-density hepatic lesions are unchanged. Small lymph nodes in the hepatic gastric ligament are increased in size from previous imaging (image 137, series 3) 12 mm. Musculoskeletal: Sclerotic bone lesion on image 113 of series 3 in LEFT lateral seventh rib antral laterally. Similar to prior exam. Tiny sclerotic focus in the LEFT anterior fourth rib also similar findings while nonspecific are suspicious given other findings. IMPRESSION: 1. Pleural thickening, nodularity and pleural based mass with central adenopathy versus mass suspicious for pulmonary neoplasm with extensive pleural metastases. Mesothelioma is also considered given distribution with unilateral pleural involvement. Due to unilateral involvement metastatic disease from other primary is considered lest likely but could potentially have this appearance. 2. Increasing size of LEFT subcarinal lymph node adjacent to LEFT mainstem bronchus without additional signs of more central adenopathy in the chest aside from areas outlined above. 3.  Increasing size of upper abdominal lymph nodes suspicious for disease below the diaphragm, consider abdominal imaging for further evaluation and staging. 4. Improved aeration following thoracentesis in the LEFT chest since previous imaging. 5. Calcified coronary artery disease. 6. Emphysema and aortic atherosclerosis. Aortic Atherosclerosis (ICD10-I70.0) and Emphysema (ICD10-J43.9). Electronically Signed   By: Zetta Bills M.D.   On: 06/05/2020 16:13   NM PET Image Initial (PI) Skull Base To Thigh  Result Date: 06/12/2020 CLINICAL DATA:  Initial treatment strategy for left pulmonary nodule. EXAM: NUCLEAR MEDICINE PET SKULL BASE TO THIGH TECHNIQUE: 10.6 mCi F-18 FDG was injected intravenously. Full-ring PET imaging was performed from the skull base  to thigh after the radiotracer. CT data was obtained and used for attenuation correction and anatomic localization. Fasting blood glucose: 99 mg/dl COMPARISON:  Chest CT 06/05/2020 FINDINGS: Mediastinal blood pool activity: SUV max 2.4 Liver activity: SUV max NA NECK: No significant abnormal hypermetabolic activity in this region. Incidental CT findings: none CHEST: Extensive hypermetabolic pleural deposits along the visceral parietal pleura in the left chest. The posteromedial rind of tumor in the left thorax representative location has a maximum SUV of 17.0. A left anterior pleural tumor deposit measuring about 3.5 by 3.7 cm on image 104/4 has a maximum SUV 17.4. Numerous foci of pleural-based tumor present on the left along with the large loculated left pleural effusion. There is consolidation and volume loss in the left lower lobe with extensive accentuated metabolic activity within the long in this region, maximum SUV 11.3. Difficult to discern a discrete mass as much of this activity could be from visceral pleural tumor. This abnormal activity extends up into the left hilum where the maximum SUV is 9.7. There is hypermetabolic left paratracheal, subcarinal,  internal mammary, and subpectoral adenopathy in the chest. The subcarinal lymph node measures 1.6 cm in short axis on image 74/4 and has maximum SUV of 16.8. A left internal mammary node has short axis diameter 1.0 cm on image 63/4 and maximum SUV 7.8. There is a focus of malignancy along the left of the xiphoid measuring 1.0 by 1.4 cm on image 102/4 with maximum SUV of 6.3. Incidental CT findings: There is shift of cardiac and mediastinal structures to the right. Diffusely mildly prominent thyroid gland. Centrilobular emphysema. Aortic Atherosclerosis (ICD10-I70.0). ABDOMEN/PELVIS: Hypermetabolic right gastric and periaortic adenopathy in the upper abdomen. A left periaortic lymph node just anterior to the left hemidiaphragmatic crus measures 1.7 cm in short axis on image 119/4 with maximum SUV 12.5. Incidental CT findings: Hypodense lesions in the liver are not hypermetabolic are probably cysts or similar benign lesions. Aortoiliac atherosclerotic vascular disease. Left mid kidney cysts medially. Excreted FDG in the urinary bladder, prostate accurate eighth rib, and penile urethra with a penile clamp in place. SKELETON: Foci of hypermetabolic tumor present in iliac bones and sacrum, including a right iliac bone hypermetabolic lesion with maximum SUV of 11.9. These osseous metastatic lesions are relatively inconspicuous on the CT data. There is paraspinal tumor along the pleural margin on the left at T11 and T12 believed to be eroding into the T11 vertebral body along the T11-12 neural foramen. Small focus of tumor the right proximal femur below the lesser trochanter. Incidental CT findings: none IMPRESSION: 1. 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 Accentuated activity along the consolidated and atelectatic left lower lobe probably from tumor within the lung but potentially from tumor along the visceral pleural. Hypermetabolic thoracic adenopathy  along with hypermetabolic upper abdominal adenopathy and hypermetabolic skeletal metastatic lesions as detailed above. 2. Possibly early tumor extension into the left T11-12 neural foramen, with some erosion of the adjacent T11 vertebral body. 3. Other imaging findings of potential clinical significance: Aortic Atherosclerosis (ICD10-I70.0) and Emphysema (ICD10-J43.9). Electronically Signed   By: Van Clines M.D.   On: 06/12/2020 17:00   DG Chest Port 1 View  Result Date: 06/22/2020 CLINICAL DATA:  Postop check.  Chest tube placement. EXAM: PORTABLE CHEST 1 VIEW COMPARISON:  Radiograph earlier this day.  Most recent CT 06/06/2019 FINDINGS: Pigtail catheter projects over the left mid lung. No visualized pneumothorax. Improved hazy opacity at the left  lung base consistent with decreasing pleural effusion and associated airspace disease/atelectasis. Small volume of pleural fluid and or thickening persists. Slight increase in right infrahilar atelectasis. Stable heart size and mediastinal contours. IMPRESSION: 1. Left pigtail catheter in place. No visualized pneumothorax. 2. Improved hazy opacity at the left lung base consistent with decreasing pleural effusion and associated airspace disease/atelectasis. Small volume of pleural fluid and or thickening persists. Electronically Signed   By: Keith Rake M.D.   On: 06/22/2020 15:44   DG Chest Portable 1 View  Result Date: 06/22/2020 CLINICAL DATA:  Chest tube placement EXAM: PORTABLE CHEST 1 VIEW COMPARISON:  June 22, 2020 study obtained earlier in the day FINDINGS: Chest tube present on the left with considerable diminution in size of left pleural effusion. There is a fairly small residual left pleural effusion with patchy atelectasis in the left mid and lower lung regions. Lungs elsewhere are clear. Heart is upper normal in size with pulmonary vascularity normal. No adenopathy. No pneumothorax. No bone lesions. IMPRESSION: Left pleural effusion  considerably smaller after chest tube placement. There is a fairly small persistent left pleural effusion with atelectatic change in the left mid and lower lung regions. Lungs otherwise clear. Heart upper normal in size. Electronically Signed   By: Lowella Grip III M.D.   On: 06/22/2020 13:49   US THORACENTESIS ASP PLEURAL SPACE W/IMG GUIDE  Result Date: 06/05/2020 INDICATION: Patient with history of pneumonia with recurrent left-sided pleural effusion. Request is for therapeutic and diagnostic thoracentesis EXAM: ULTRASOUND GUIDED THERAPEUTIC AND DIAGNOSTIC THORACENTESIS MEDICATIONS: Lidocaine 1% 10 mL COMPLICATIONS: None immediate. PROCEDURE: An ultrasound guided thoracentesis was thoroughly discussed with the patient and questions answered. The benefits, risks, alternatives and complications were also discussed. The patient understands and wishes to proceed with the procedure. Written consent was obtained. Ultrasound was performed to localize and mark an adequate pocket of fluid in the left chest. The area was then prepped and draped in the normal sterile fashion. 1% Lidocaine was used for local anesthesia. Under ultrasound guidance a 6 Fr Safe-T-Centesis catheter was introduced. Thoracentesis was performed. The catheter was removed and a dressing applied. FINDINGS: A total of approximately 1.8 L of serosanguineous fluid was removed. Samples were sent to the laboratory as requested by the clinical team. IMPRESSION: Successful ultrasound guided therapeutic and diagnostic left-sided thoracentesis yielding 1.8 L of pleural fluid. Read by: Rushie Nyhan, NP Electronically Signed   By: Jerilynn Mages.  Shick M.D.   On: 06/05/2020 15:57    Labs:  CBC: Recent Labs    05/20/20 1213 06/17/20 1316 06/22/20 1118 06/22/20 1118 06/22/20 1618 06/22/20 2107 06/23/20 0507 06/23/20 1111  WBC  --  8.7 9.8  --   --   --  7.4 7.0  HGB   < > 12.9* 9.8*   < > 11.0* 9.3* 10.9* 9.9*  HCT   < > 38.4 29.1*   < > 32.2*  25.8* 31.9* 28.8*  PLT  --  174 220  --   --   --  174 185   < > = values in this interval not displayed.    COAGS: Recent Labs    06/22/20 1404  INR 1.2  APTT 35    BMP: Recent Labs    05/20/20 1213 06/17/20 1316 06/22/20 1118 06/23/20 0507  NA 133* 138 131* 135  K 4.2 5.2 5.5* 5.1  CL 98 101 100 105  CO2 23 18* 21* 21*  GLUCOSE 93 81 106* 84  BUN 12 51* 70*  50*  CALCIUM 9.6 9.1 8.9 8.6*  CREATININE 0.85 1.98* 2.75* 1.36*  GFRNONAA >60 32* 22* 51*  GFRAA >60 37* 25* 59*    LIVER FUNCTION TESTS: Recent Labs    07/29/19 1107 05/01/20 0926 05/20/20 1213 06/17/20 1316  BILITOT 0.5 0.6 1.1 0.3  AST 21 20 24 22   ALT 17 11 19 21   ALKPHOS 69 58 53 67  PROT 7.5 6.7 7.8 6.2  ALBUMIN 4.3 4.3 4.2 3.4*    TUMOR MARKERS: No results for input(s): AFPTM, CEA, CA199, CHROMGRNA in the last 8760 hours.  Assessment and Plan: Hypermetabolic pulm and pleural masses Imaging reviewed with Dr. Earleen Newport Plan to proceed with CT guided bx tomorrow. Labs reviewed. NPO p MN Risks and benefits of left pleural mass bx was discussed with the patient and/or patient's family including, but not limited to bleeding, infection, damage to adjacent structures or low yield requiring additional tests.  All of the questions were answered and there is agreement to proceed.  Consent signed and in chart.    Thank you for this interesting consult.  I greatly enjoyed meeting SHARRON SIMPSON and look forward to participating in their care.  A copy of this report was sent to the requesting provider on this date.  Electronically Signed: Ascencion Dike, PA-C 06/23/2020, 2:05 PM   I spent a total of 20 minutes in face to face in clinical consultation, greater than 50% of which was counseling/coordinating care for left pleural mass bx

## 2020-06-23 NOTE — Progress Notes (Signed)
Patient ID: Dwayne Thompson, male   DOB: [birth year], 74 y.o.   MRN: [id number]  Feels better today.  Not nearly as short of breath.  Still having chest wall pain.  Lung exam shows much better air entry into the left chest.  Minimal serosanguinous fluid from chest tube.  Less than 200 cc last 12 hours. Heart regular  To have CT guided biopsy today.  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  [employer].

## 2020-06-23 NOTE — Progress Notes (Signed)
   06/23/20 1200  Clinical Encounter Type  Visited With Patient and family together  Visit Type Initial  Referral From Chaplain  Consult/Referral To Chaplain  While rounding the unit, chaplain stopped in to visit with Pt and his wife. Wife said she was fine. Pt was eating and chaplain did not want to hold him up. Chaplain told them that she will check in on him later.

## 2020-06-23 NOTE — TOC Initial Note (Signed)
Transition of Care [facility]) - Initial/Assessment Note    Patient Details  Name: Dwayne Thompson MRN: [id number] Date of Birth: May 27, 1946  [facility]) CM/SW Contact:    Magnus Ivan, LCSW Phone Number: 06/23/2020, 1:41 PM  Clinical Narrative:               CSW received a consult for medication assistance. Met with patient and patient's spouse at bedside. They declined issues with affording or obtaining medications. They reported patient uses the pharmacy at [facility] in [place].    Discussed patient's home set up. Patient lives with his wife. Patient drives himself to appointments. PCP is Dr. Rosanna Randy. Patient has a cane. No HH or SNF history.   Patient and spouse denied needs at this time. They reported they are waiting for his biopsy results to determine a plan of care with the MD. This CSW encouraged them to reach out with any needs and will continue to follow during patient's hospitalization.   Expected Discharge Plan: Home/Self Care Barriers to Discharge: Continued Medical Work up   Patient Goals and CMS Choice Patient states their goals for this hospitalization and ongoing recovery are:: to return home with spouse CMS Medicare.gov Compare Post Acute Care list provided to:: Patient Choice offered to / list presented to : Patient, Spouse  Expected Discharge Plan and Services Expected Discharge Plan: Home/Self Care       Living arrangements for the past 2 months: Single Family Home                                      Prior Living Arrangements/Services Living arrangements for the past 2 months: Single Family Home Lives with:: Spouse Patient language and need for interpreter reviewed:: Yes Do you feel safe going back to the place where you live?: Yes      Need for Family Participation in Patient Care: Yes (Comment) Care giver support system in place?: Yes (comment) Current home services: DME Criminal Activity/Legal Involvement Pertinent to Current  Situation/Hospitalization: No - Comment as needed  Activities of Daily Living Home Assistive Devices/Equipment: Cane (specify quad or straight) ADL Screening (condition at time of admission) Patient's cognitive ability adequate to safely complete daily activities?: Yes Is the patient deaf or have difficulty hearing?: No Does the patient have difficulty seeing, even when wearing glasses/contacts?: No Does the patient have difficulty concentrating, remembering, or making decisions?: No Patient able to express need for assistance with ADLs?: Yes Does the patient have difficulty dressing or bathing?: No Independently performs ADLs?: Yes (appropriate for developmental age) Does the patient have difficulty walking or climbing stairs?: Yes Weakness of Legs: Both Weakness of Arms/Hands: None  Permission Sought/Granted   Permission granted to share information with : Yes, Verbal Permission Granted              Emotional Assessment Appearance:: Appears stated age Attitude/Demeanor/Rapport: Engaged Affect (typically observed): Calm Orientation: : Oriented to Situation, Oriented to  Time, Oriented to Place, Oriented to Self Alcohol / Substance Use: Not Applicable Psych Involvement: No (comment)  Admission diagnosis:  Acute respiratory failure (HCC) [J96.00] Shortness of breath [R06.02] Other chest pain [R07.89] Hemothorax [J94.2] Postop check [Z09] Hypoxia [R09.02] AKI (acute kidney injury) ([place]) [N17.9] Pleural mass [J94.8] Patient Active Problem List   Diagnosis Date Noted  . Pressure injury of skin 06/23/2020  . Pleural mass   . Bone metastasis ([place])   .  Hemothorax   . Acute respiratory failure (HCC) 06/22/2020  . Cirrhosis (HCC)   . Hemothorax on left   . Left flank pain 04/23/2020  . Hypertension 03/12/2019  . Thoracic aortic aneurysm (HCC) 03/12/2019  . Abnormal chest CT 02/13/2019  . Difficulty walking 12/06/2018  . Muscle weakness 12/06/2018  . Cellulitis of lower  leg 09/17/2018  . CLE (columnar lined esophagus)   . Special screening for malignant neoplasms, colon   . Benign neoplasm of ascending colon   . Polyp of sigmoid colon   . Diverticulosis of large intestine without diverticulitis   . Iron deficiency anemia due to chronic blood loss 02/18/2018  . Folate deficiency 02/14/2018  . B12 deficiency 01/10/2018  . Folic acid deficiency 01/10/2018  . 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 Helicobacter pylori gastritis (chronic gastritis) 01/10/2018  . Thrombocytopenia (HCC) 01/10/2018  . Internal hemorrhoids   . Acute gastrointestinal hemorrhage   . Stomach irritation   . Duodenal nodule   . Melena 01/01/2018  . Neck pain on right side 03/23/2016  . History of tobacco abuse 03/23/2016  . Cervical lymphadenitis 03/23/2016  . 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 Allergic rhinitis 03/28/2015  . 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 Failure of erection 03/28/2015  . Acid reflux 03/28/2015  . Arthritis, degenerative 03/28/2015  . Change in blood platelet count 03/28/2015  . Temporary cerebral vascular dysfunction 03/28/2015  . Personal history of tobacco use, presenting hazards to health 03/28/2015   PCP:  Gilbert, Richard L Jr., MD Pharmacy:   [facility] - [place], [place] - [address] AT Portal to Registered [facility] Sites [address] [place] [address] Phone: [phone] Fax: [phone]  [facility] - [place] - [address] [place] [address] Phone: [phone] Fax: [phone]     Social Determinants of Health (SDOH) Interventions    Readmission Risk Interventions No flowsheet data found.  

## 2020-06-23 NOTE — Consult Note (Addendum)
Consultation Note Date: 06/23/2020   Patient Name: Dwayne Thompson  DOB: 1946-05-27  MRN: [id number]  Age / Sex: 74 y.o., male  PCP: Dwayne Thompson., MD Referring Physician: Max Sane, MD  Reason for Consultation: Establishing goals of care  HPI/Patient Profile:   Dwayne Thompson is a 74 y.o. male with medical history significant for liver cirrhosis, BPH, COPD, hypertension and chronic low back pain who was presented to the ER from the cardiothoracic surgeons office for evaluation of hypotension and tachycardia.  Patient has a history of mesothelioma as well as recurrent left pleural effusions and is status post 2 prior thoracentesis with drainage of serosanguineous fluid.  He was scheduled to see the cardiothoracic surgeon today but states that he developed worsening shortness of breath about 2 days prior to presentation.  Clinical Assessment and Goals of Care: Patient is resting in bed and wife is at bedside. Wife appear anxious and states they are just waiting for the biopsy, results, and options. Support offered. She discusses her current cancer diagnosis with poor prognosis, and fear over what will happen to she and her husband pending the outcome of his biopsy. Dwayne Thompson is independent at baseline and works a part time job. He takes his wife to her treatments. She states they have children, but that their children have lives to live, and young children of their own, and would not be able to provide the support they may come to need.  Per the couple, wife has a living will but Dwayne Thompson does not. They would like to wait to have a diagnosis, and to find out his options before discussing [place] further. At this time, there is no care or procedure he would not want.      SUMMARY OF RECOMMENDATIONS   Full code and full scope at this time.  Will shadow and f/u when results of biopsy are available, and patient has  heard what options he may have. Will f/u sooner if patient declines.   Recommend outpatient palliative to follow at D/C.   Prognosis:   Unable to determine      Primary Diagnoses: Present on Admission: . Acute respiratory failure ([place]) . BPH with obstruction/lower urinary tract symptoms . Thrombocytopenia ([place]) . Cirrhosis ([place]) . Hemothorax on left   I have reviewed the medical record, interviewed the patient and family, and examined the patient. The following aspects are pertinent.  Past Medical History:  Diagnosis Date  . Anemia   . Back pain   . BPH (benign prostatic hyperplasia)   . Cirrhosis ([place])   . COPD (chronic obstructive pulmonary disease) ([place])   . ED (erectile dysfunction)   . GERD (gastroesophageal reflux disease)   . Hyperlipidemia   . Hypertension    Social History   Socioeconomic History  . Marital status: Married    Spouse name: Dwayne Thompson  . Number of children: 3  . Years of education: 20  . Highest education level: 12th grade  Occupational History  . Occupation: retired  Tobacco  Use  . Smoking status: Former Smoker    Packs/day: 1.00    Years: 50.00    Pack years: 50.00    Types: Cigarettes    Quit date: 10/20/2013    Years since quitting: 6.6  . Smokeless tobacco: Never Used  Vaping Use  . Vaping Use: Never used  Substance and Sexual Activity  . Alcohol use: Yes    Alcohol/week: 21.0 - 28.0 standard drinks    Types: 21 - 28 Shots of liquor per week    Comment: 3-4 gin and sodas a day  . Drug use: No  . Sexual activity: Not Currently  Other Topics Concern  . Not on file  Social History Narrative  . Not on file   Social Determinants of Health   Financial Resource Strain: Low Risk   . Difficulty of Paying Living Expenses: Not hard at all  Food Insecurity: No Food Insecurity  . Worried About Charity fundraiser in the Last Year: Never true  . Ran Out of Food in the Last Year: Never true  Transportation Needs: No Transportation  Needs  . Lack of Transportation (Medical): No  . Lack of Transportation (Non-Medical): No  Physical Activity: Insufficiently Active  . Days of Exercise per Week: 1 day  . Minutes of Exercise per Session: 60 min  Stress: No Stress Concern Present  . Feeling of Stress : Not at all  Social Connections: Moderately Integrated  . Frequency of Communication with Friends and Family: Three times a week  . Frequency of Social Gatherings with Friends and Family: Once a week  . Attends Religious Services: Never  . Active Member of Clubs or Organizations: Yes  . Attends Archivist Meetings: 1 to 4 times per year  . Marital Status: Married   Family History  Problem Relation Age of Onset  . Alzheimer's disease Mother   . Sarcoidosis Sister   . Anxiety disorder Brother   . Kidney disease Neg Hx   . Prostate cancer Neg Hx    Scheduled Meds: . sodium chloride   Intravenous Once  . Chlorhexidine Gluconate Cloth  6 each Topical Daily  . cholecalciferol  1,000 Units Oral Daily  . dutasteride  0.5 mg Oral Daily  . fentaNYL (SUBLIMAZE) injection  25 mcg Intravenous Once  . folic acid  1 mg Oral Daily  . [START ON 06/24/2020] influenza vaccine adjuvanted  0.5 mL Intramuscular Tomorrow-1000  . loratadine  10 mg Oral Daily  . montelukast  10 mg Oral Daily  . pantoprazole  20 mg Oral BID  . rosuvastatin  10 mg Oral Daily  . tamsulosin  0.4 mg Oral Daily  . cyanocobalamin  1,000 mcg Oral Daily   Continuous Infusions: . sodium chloride 125 mL/hr at 06/23/20 0559   PRN Meds:.acetaminophen **OR** acetaminophen, diclofenac Sodium, fentaNYL (SUBLIMAZE) injection, ondansetron **OR** ondansetron (ZOFRAN) IV, oxyCODONE Medications Prior to Admission:  Prior to Admission medications   Medication Sig Start Date End Date Taking? Authorizing Provider  aspirin 81 MG chewable tablet Chew by mouth daily.   Yes [provider]  cholecalciferol (VITAMIN D3) 25 MCG (1000 UNIT) tablet Take 1,000  Units by mouth daily.   Yes [provider]  dutasteride (AVODART) 0.5 MG capsule Take 0.5 mg by mouth daily.   Yes [provider]  folic acid (FOLVITE) 1 MG tablet Take 1 tablet (1 mg total) by mouth daily. 12/15/18  Yes Lequita Asal, MD  HYDROcodone-acetaminophen (NORCO) 10-325 MG tablet Take 1  tablet by mouth every 4 (four) hours as needed. 06/17/20  Yes Dwayne Thompson., MD  montelukast (SINGULAIR) 10 MG tablet TAKE 1 TABLET BY MOUTH EVERY DAY Patient taking differently: Take 10 mg by mouth daily.  05/12/20  Yes Dwayne Thompson., MD  pantoprazole (PROTONIX) 20 MG tablet Take 20 mg by mouth 2 (two) times daily.   Yes [provider]  potassium chloride (K-DUR,KLOR-CON) 10 MEQ tablet Take 1 tablet (10 mEq total) by mouth 2 (two) times daily. 12/21/18  Yes Dwayne Thompson., MD  rosuvastatin (CRESTOR) 10 MG tablet Take 1 tablet (10 mg total) by mouth daily. 10/28/19  Yes Dwayne Thompson., MD  vitamin B-12 1000 MCG tablet Take 1 tablet (1,000 mcg total) by mouth daily. 01/05/18  Yes Salary, Avel Peace, MD  acetaminophen (TYLENOL) 325 MG tablet Take 2 tablets (650 mg total) by mouth 3 (three) times daily. Patient taking differently: Take 650 mg by mouth every 6 (six) hours as needed for moderate pain.  01/04/18   Salary, Avel Peace, MD  AMBULATORY NON FORMULARY MEDICATION Trimix (30 mg PAPA, 1 mL phen, 50 mg prostaglandin)   Vial 68ml   Dose 0.76mcg  Qty 10 #6 refills  [place] [phone] Fax [phone] 07/19/19   [name] A, PA-C  cetirizine (ZYRTEC) 10 MG tablet Take 1 tablet (10 mg total) by mouth daily. 01/30/20   Dwayne Thompson., MD  diclofenac Sodium (VOLTAREN) 1 % GEL Apply 1 application topically 4 (four) times daily as needed (pain).    [provider]  doxazosin (CARDURA) 2 MG tablet Take 1 tablet (2 mg total) by mouth daily. Patient not taking: Reported on 06/18/2020 04/24/20 06/18/20  Trinna Post, PA-C  oxyCODONE (ROXICODONE) 5 MG immediate release tablet Take 1 tablet (5 mg total) by mouth every 4 (four) hours as needed for severe pain. 06/18/20   Dwayne Thompson., MD  tamsulosin (FLOMAX) 0.4 MG CAPS capsule Take 0.4 mg by mouth daily.    [provider]   Allergies  Allergen Reactions  . Indomethacin Swelling    ankle swelling  . Tamsulosin Hcl Hives  . Sulfa Antibiotics Rash   Review of Systems  Constitutional: Positive for fatigue.    Physical Exam Pulmonary:     Effort: Pulmonary effort is normal.  Neurological:     Mental Status: He is alert.     Vital Signs: BP 109/71   Pulse 92   Temp 98 F (36.7 C) (Oral)   Resp 16   Ht 5\' 11"  (1.803 m)   Wt 86.8 kg   SpO2 100%   BMI 26.69 kg/m  Pain Scale: 0-10   Pain Score: 8    SpO2: SpO2: 100 % O2 Device:SpO2: 100 % O2 Flow Rate: .O2 Flow Rate (L/min): 3 L/min  IO: Intake/output summary:   Intake/Output Summary (Last 24 hours) at 06/23/2020 1228 Last data filed at 06/23/2020 0900 Gross per 24 hour  Intake --  Output 3225 ml  Net -3225 ml    LBM:   Baseline Weight: Weight: 76.2 kg Most recent weight: Weight: 86.8 kg     Palliative Assessment/Data:     Time In:11:20 Time Out: 12:00 Time Total:40 min Greater than 50%  of this time was spent counseling and coordinating care related to the above assessment and plan.  Signed by: Asencion Gowda, NP   Please contact Palliative Medicine Team phone at [phone] for questions and concerns.  For individual provider: See Shea Evans

## 2020-06-23 NOTE — Telephone Encounter (Signed)
Spoke to Dr. Patsey Berthold verbally, who states that biopsy is scheduled for 06/24/2020 at [facility].  Patient's spouse, [name] [facility]) is aware and voiced her understanding.  Nothing further needed.

## 2020-06-24 ENCOUNTER — Inpatient Hospital Stay: Payer: Medicare Other

## 2020-06-24 ENCOUNTER — Ambulatory Visit: Payer: Self-pay | Admitting: Family Medicine

## 2020-06-24 DIAGNOSIS — J942 Hemothorax: Secondary | ICD-10-CM | POA: Diagnosis not present

## 2020-06-24 DIAGNOSIS — J948 Other specified pleural conditions: Secondary | ICD-10-CM | POA: Diagnosis not present

## 2020-06-24 DIAGNOSIS — J9601 Acute respiratory failure with hypoxia: Secondary | ICD-10-CM | POA: Diagnosis not present

## 2020-06-24 LAB — CBC
HCT: 27.9 % — ABNORMAL LOW (ref 39.0–52.0)
HCT: 29.3 % — ABNORMAL LOW (ref 39.0–52.0)
Hemoglobin: 9.7 g/dL — ABNORMAL LOW (ref 13.0–17.0)
Hemoglobin: 10.2 g/dL — ABNORMAL LOW (ref 13.0–17.0)
MCH: 30.9 pg (ref 26.0–34.0)
MCH: 31.2 pg (ref 26.0–34.0)
MCHC: 34.8 g/dL (ref 30.0–36.0)
MCHC: 34.8 g/dL (ref 30.0–36.0)
MCV: 88.9 fL (ref 80.0–100.0)
MCV: 89.6 fL (ref 80.0–100.0)
Platelets: 195 10*3/uL (ref 150–400)
Platelets: 186 10*3/uL (ref 150–400)
RBC: 3.14 MIL/uL — ABNORMAL LOW (ref 4.22–5.81)
RBC: 3.27 MIL/uL — ABNORMAL LOW (ref 4.22–5.81)
RDW: 12.7 % (ref 11.5–15.5)
RDW: 12.6 % (ref 11.5–15.5)
WBC: 6.3 10*3/uL (ref 4.0–10.5)
WBC: 6.7 10*3/uL (ref 4.0–10.5)
nRBC: 0 % (ref 0.0–0.2)
nRBC: 0 % (ref 0.0–0.2)

## 2020-06-24 LAB — MAGNESIUM: Magnesium: 1.8 mg/dL (ref 1.7–2.4)

## 2020-06-24 LAB — BASIC METABOLIC PANEL
Anion gap: 6 (ref 5–15)
BUN: 24 mg/dL — ABNORMAL HIGH (ref 8–23)
CO2: 23 mmol/L (ref 22–32)
Calcium: 8.1 mg/dL — ABNORMAL LOW (ref 8.9–10.3)
Chloride: 105 mmol/L (ref 98–111)
Creatinine, Ser: 0.9 mg/dL (ref 0.61–1.24)
GFR calc non Af Amer: >60 mL/min (ref >60)
Glucose, Bld: 94 mg/dL (ref 70–99)
Potassium: 4.8 mmol/L (ref 3.5–5.1)
Sodium: 134 mmol/L — ABNORMAL LOW (ref 135–145)

## 2020-06-24 LAB — GLUCOSE, CAPILLARY: Glucose-Capillary: 86 mg/dL (ref 70–99)

## 2020-06-24 LAB — PHOSPHORUS: Phosphorus: 3.1 mg/dL (ref 2.5–4.6)

## 2020-06-24 MED ORDER — FENTANYL CITRATE (PF) 100 MCG/2ML IJ SOLN
INTRAMUSCULAR | Status: AC
  Filled 2020-06-24: qty 2

## 2020-06-24 MED ORDER — MIDAZOLAM HCL 5 MG/5ML IJ SOLN
INTRAMUSCULAR | Status: AC | PRN
  Administered 2020-06-24: 0.5 mg via INTRAVENOUS

## 2020-06-24 MED ORDER — MAGNESIUM SULFATE 2 GM/50ML IV SOLN
2.0000 g | Freq: Once | INTRAVENOUS | Status: AC
  Administered 2020-06-24: 2 g via INTRAVENOUS
  Filled 2020-06-24: qty 50

## 2020-06-24 MED ORDER — MIDAZOLAM HCL 5 MG/5ML IJ SOLN
INTRAMUSCULAR | Status: AC
  Filled 2020-06-24: qty 5

## 2020-06-24 MED ORDER — DOCUSATE SODIUM 100 MG PO CAPS
100.0000 mg | ORAL_CAPSULE | Freq: Every day | ORAL | Status: DC
  Administered 2020-06-25 – 2020-07-02 (×7): 100 mg via ORAL
  Filled 2020-06-24 (×7): qty 1

## 2020-06-24 MED ORDER — FENTANYL CITRATE (PF) 100 MCG/2ML IJ SOLN
INTRAMUSCULAR | Status: AC | PRN
  Administered 2020-06-24: 25 ug via INTRAVENOUS

## 2020-06-24 MED ORDER — FENTANYL CITRATE (PF) 100 MCG/2ML IJ SOLN
25.0000 ug | INTRAMUSCULAR | Status: DC | PRN
  Administered 2020-06-24 – 2020-06-28 (×4): 25 ug via INTRAVENOUS
  Filled 2020-06-24 (×4): qty 2

## 2020-06-24 MED ORDER — POLYETHYLENE GLYCOL 3350 17 G PO PACK
17.0000 g | PACK | Freq: Every day | ORAL | Status: DC
  Administered 2020-06-25 – 2020-07-02 (×6): 17 g via ORAL
  Filled 2020-06-24 (×6): qty 1

## 2020-06-24 NOTE — Progress Notes (Signed)
NAME:  Dwayne Thompson, MRN:  [id number], DOB:  1946-04-16, LOS: 2 ADMISSION DATE:  06/22/2020, CONSULTATION DATE:  06/23/20 REFERRING MD:  Max Sane, CHIEF COMPLAINT:  Bloody pleura effusion/hemothroax  Brief History   This is a 74 yo with history BPH, cirrhosis, COPD, ED, GERD, HLD and pleural effusion. Presents with hemothorax noted in ED for which chest tube placed.   Past Medical History  BPH Back pain Cirrhosis COPD GERD HTN HLD  Significant Hospital Events   10/4 chest tube placed 10/5- chest tube remains to suction of 20. 670 cc's of bloody output, has slowed- Received 1 unit of blood 10/6- chest remains to suction with < 100 mL out, biopsy today  Consults:  PCCM CTS  Procedures:  10/5 chest tube >> 10/6 biopsy >>  Significant Diagnostic Tests:  9/24 PET scan >> pleural based pet avid lesion, hypermetabolic thoracic adenopathy- Station 7 LN, along with hypermetabolic upper abdominal adenopathy and hypermetabolic skeletal metastatic lesions.  Micro Data:  MRSA 10/5 >> negative  Antimicrobials:  NA  Interim history/subjective:   Patient c/o L sided pleural pain, abdominal and low back tenderness.  Discussed plan for biopsy today.   Overnight no issues per care RN, this morning some c/o ongoing pain Labs/Imaging personally reviewed  CT in place to sxn, no air leak, 10 mL output overnight BUN/ Cr.: 24/ 0.90 Hgb: 9.7  CXR 10/6: L sided CT in stable position, significantly enlarged from previous film- moderate L pleural effusion with atelectasis vs infiltrate in remaining aerated L lung.  Wife updated bedside. Objective   Blood pressure 110/69, pulse 99, temperature 98.6 F (37 C), temperature source Oral, resp. rate 17, height 5\' 11"  (1.803 m), weight 86.8 kg, SpO2 97 %.        Intake/Output Summary (Last 24 hours) at 06/24/2020 0913 Last data filed at 06/24/2020 0854 Gross per 24 hour  Intake 4738.67 ml  Output 1835 ml  Net 2903.67 ml   Filed Weights    06/22/20 1112 06/23/20 0545  Weight: 76.2 kg 86.8 kg    Examination: General: Adult male, sitting up in bed, NAD HEENT: MM pink/moist, anicteric, glasses in place Neuro: Alert x Oriented, follows commands, MAE CV: s1s2, RRR, NSR on monitor, no murmurs/rubs/gallops, pulses +2 R/P Pulm: regular, non labored on room air, coarse/ diminished breath sounds. L- sided CT remains in place/ to sxn- no air leak, minimal output GI: soft, tender to palpation, bs x 4 Skin: known stage II injury on buttocks, no rashes/lesions present Extremities: warm/dry, +1 edema BLE  Resolved Hospital Problem list   NA  Assessment & Plan:  This is a 74 yo with history as noted above who presentes for worsening respiratory status in context of   Actue respiratory failure 2/2 hypoxia-SP chest tube placements known effusion that is highly suspicious for malignancy but cytology negative x2 thus far.  - continue to monitor H & H Q 8 - monitor output from CT, continue -20 sxn - Dr. Genevive Bi following, appreciate input - continuous SpO2 monitoring, goal > 90% - low dose fentanyl PRN & oxycodone for analgesia  Pleural mass that is PET avid IR biopsy 10/6  - monitor H&H Q 8 - consider EBUS if unable to attain - Palliative following, appreciate input  Acute on chronic renal failure-Improved significantly  Cr normalized  - continue to monitor UOP, avoid nephrotoxins - ensure adequate renal perfusion - Daily BMET  Labs   CBC: Recent Labs  Lab 06/17/20 1316 06/22/20 1118 06/22/20  1618 06/22/20 2107 06/23/20 0507 06/23/20 1111 06/23/20 1822 06/24/20 0224  WBC 8.7 9.8  --   --  7.4 7.0 6.5 6.3  NEUTROABS 6.7  --   --   --   --   --   --   --   HGB 12.9* 9.8*   < > 9.3* 10.9* 9.9* 10.0* 9.7*  HCT 38.4 29.1*   < > 25.8* 31.9* 28.8* 29.2* 27.9*  MCV 92 90.9  --   --  91.1 89.7 89.8 88.9  PLT 174 220  --   --  174 185 194 195   < > = values in this interval not displayed.    Basic Metabolic Panel: Recent  Labs  Lab 06/17/20 1316 06/22/20 1118 06/23/20 0507 06/24/20 0224  NA 138 131* 135 134*  K 5.2 5.5* 5.1 4.8  CL 101 100 105 105  CO2 18* 21* 21* 23  GLUCOSE 81 106* 84 94  BUN 51* 70* 50* 24*  CREATININE 1.98* 2.75* 1.36* 0.90  CALCIUM 9.1 8.9 8.6* 8.1*  MG  --   --   --  1.8  PHOS  --   --   --  3.1   GFR: Estimated Creatinine Clearance: 76.7 mL/min (by C-G formula based on SCr of 0.9 mg/dL). Recent Labs  Lab 06/23/20 0507 06/23/20 1111 06/23/20 1822 06/24/20 0224  WBC 7.4 7.0 6.5 6.3    Liver Function Tests: Recent Labs  Lab 06/17/20 1316  AST 22  ALT 21  ALKPHOS 67  BILITOT 0.3  PROT 6.2  ALBUMIN 3.4*   No results for input(s): LIPASE, AMYLASE in the last 168 hours. No results for input(s): AMMONIA in the last 168 hours.  ABG No results found for: PHART, PCO2ART, PO2ART, HCO3, TCO2, ACIDBASEDEF, O2SAT   Coagulation Profile: Recent Labs  Lab 06/22/20 1404  INR 1.2    Cardiac Enzymes: No results for input(s): CKTOTAL, CKMB, CKMBINDEX, TROPONINI in the last 168 hours.  HbA1C: Hemoglobin A1C  Date/Time Value Ref Range Status  05/22/2018 12:16 PM 5.1 4.0 - 5.6 % Final    CBG: Recent Labs  Lab 06/23/20 0541 06/24/20 0737  GLUCAP 77 86    Critical care time: 30 minutes    Domingo Pulse [place], AGACNP-BC [facility]    Please see Amion for pager details.

## 2020-06-24 NOTE — Progress Notes (Signed)
[place] visited pt. and wife briefly while passing through ICU; pt. shared he is scheduled for lung biopsy tomorrow to determine whether he might have lung cancer or mesothelioma; he said he is very grateful that up to this point in his life he has been very healthy, but he and wife are concerned re: pt.'s lungs.  Wife is dealing with cancer that was treated in 2010 and has recently returned and become metastatic.  Both expressed their love for their grandchildren and desire to spend additional time w/them.  At wife's request, [place] offered prayer for pt. and wife's medical concerns and prayed for sense of God's peace and presence.  Pt. and wife mentioned enjoying meeting [facility] [place] earlier this AM; [place] remains available as needed.

## 2020-06-24 NOTE — Procedures (Signed)
Interventional Radiology Procedure Note  Procedure: CT guided biopsy of left lower anterior chest wall/pleural mass. Mx 18g core biopsy Complications: None Recommendations: - Bedrest x 1 hr for sedation purpose. - OK for diet per primary order - follow up path - routine wound care  Signed,  Corrie Mckusick, DO

## 2020-06-24 NOTE — Progress Notes (Signed)
Pt A&OX4. PRN oxycodone for pain related to chest tube and stage 2 pressure injury to R buttock. Foam changed, Pt refuses to turn left/right at this time due to pain from chest tube. Became confused when awakened post oxycodone dose and asked wife if he could get dressed yet. Pt is symmetrical and mental status came back to baseline quickly. Clear speech. Awaiting floor bed. VSS. Will continue to monitor.

## 2020-06-24 NOTE — Progress Notes (Signed)
Ch visited with Pt and Pt's wife after Ch requested the help of Pt's wife to help witness AD completion in another room. Ch returned to visit with Pt and Pt's wife [name]. They requested prayer. Ch prayed with them. They were grateful for visit.

## 2020-06-24 NOTE — Progress Notes (Signed)
Patient transferred from ICU 14. Oxycodone given for pain

## 2020-06-24 NOTE — Progress Notes (Signed)
PROGRESS NOTE    Dwayne Thompson  NKN:[id number] DOB: [birth year] DOA: 06/22/2020 PCP: Jerrol Banana., MD   Assessment & Plan:   Principal Problem:   Acute respiratory failure [facility]) Active Problems:   BPH with obstruction/lower urinary tract symptoms   Thrombocytopenia ([place])   Hypertension   Cirrhosis ([place])   Hemothorax on left   Pleural mass   Bone metastasis ([facility])   Pressure injury of skin   Hemothorax    Acute hypoxic respiratory failure: likely secondary to hemothorax. S/p chest tube placement. Fentanyl prn for pain. Weaned off of supplemental oxygen. Gen surg following and recs apprec.  Pleural-based mass: etiology unclear, likely malignancy. IR guided biopsy today. Oncology following and recs apprec. Hx of 50 pack year, quit smoking in 2015.   AKI: likely pre-renal. Resolved   Left hemothorax: etiology unclear. Pt denies any trauma. Possible malignancy. CT guided biopsy scheduled today as per IR   Hyperkalemia: resolved   GERD: continue PPI   BPH: continue on home dose of flomax, avodart   Cirrhosis: not on lactulose, xifaxan at home. Stable   ACD: no need for a transfusion at this time. Will continue to monitor      DVT prophylaxis: SCDs Code Status: full  Family Communication: discussed pt's care w/ pt's family at bedside and answered their questions  Disposition Plan: depends on PT/OT recs   Consultants:  Onco IR   Procedures:   Antimicrobials:   Subjective: Pt c/o pain w/ deep breaths.   Objective: Vitals:   06/23/20 1900 06/24/20 0400 06/24/20 0700 06/24/20 0800  BP: 112/70  110/72 110/69  Pulse: (!) 102  (!) 103 99  Resp: 16  (!) 23 17  Temp:  98.6 F (37 C)    TempSrc:  Oral    SpO2: 100%  98% 97%  Weight:      Height:        Intake/Output Summary (Last 24 hours) at 06/24/2020 0854 Last data filed at 06/24/2020 0800 Gross per 24 hour  Intake 4738.67 ml  Output 1935 ml  Net 2803.67 ml   Filed Weights   06/22/20 1112  06/23/20 0545  Weight: 76.2 kg 86.8 kg    Examination:  General exam: Appears calm and comfortable  Respiratory system: diminished breath sounds b/l. No wheezes Cardiovascular system: S1 & S2+. No rubs, gallops or clicks.  Gastrointestinal system: Abdomen is nondistended, soft and nontender. Normal bowel sounds heard. Central nervous system: Alert and oriented. Moves all 4 extremities  Psychiatry: Judgement and insight appear normal. Mood & affect appropriate.     Data Reviewed: I have personally reviewed following labs and imaging studies  CBC: Recent Labs  Lab 06/17/20 1316 06/22/20 1118 06/22/20 1618 06/22/20 2107 06/23/20 0507 06/23/20 1111 06/23/20 1822 06/24/20 0224  WBC 8.7 9.8  --   --  7.4 7.0 6.5 6.3  NEUTROABS 6.7  --   --   --   --   --   --   --   HGB 12.9* 9.8*   < > 9.3* 10.9* 9.9* 10.0* 9.7*  HCT 38.4 29.1*   < > 25.8* 31.9* 28.8* 29.2* 27.9*  MCV 92 90.9  --   --  91.1 89.7 89.8 88.9  PLT 174 220  --   --  174 185 194 195   < > = values in this interval not displayed.   Basic Metabolic Panel: Recent Labs  Lab 06/17/20 1316 06/22/20 1118 06/23/20 0507 06/24/20 0224  NA 138  131* 135 134*  K 5.2 5.5* 5.1 4.8  CL 101 100 105 105  CO2 18* 21* 21* 23  GLUCOSE 81 106* 84 94  BUN 51* 70* 50* 24*  CREATININE 1.98* 2.75* 1.36* 0.90  CALCIUM 9.1 8.9 8.6* 8.1*  MG  --   --   --  1.8  PHOS  --   --   --  3.1   GFR: Estimated Creatinine Clearance: 76.7 mL/min (by C-G formula based on SCr of 0.9 mg/dL). Liver Function Tests: Recent Labs  Lab 06/17/20 1316  AST 22  ALT 21  ALKPHOS 67  BILITOT 0.3  PROT 6.2  ALBUMIN 3.4*   No results for input(s): LIPASE, AMYLASE in the last 168 hours. No results for input(s): AMMONIA in the last 168 hours. Coagulation Profile: Recent Labs  Lab 06/22/20 1404  INR 1.2   Cardiac Enzymes: No results for input(s): CKTOTAL, CKMB, CKMBINDEX, TROPONINI in the last 168 hours. BNP (last 3 results) No results for  input(s): PROBNP in the last 8760 hours. HbA1C: No results for input(s): HGBA1C in the last 72 hours. CBG: Recent Labs  Lab 06/23/20 0541 06/24/20 0737  GLUCAP 77 86   Lipid Profile: No results for input(s): CHOL, HDL, LDLCALC, TRIG, CHOLHDL, LDLDIRECT in the last 72 hours. Thyroid Function Tests: No results for input(s): TSH, T4TOTAL, FREET4, T3FREE, THYROIDAB in the last 72 hours. Anemia Panel: No results for input(s): VITAMINB12, FOLATE, FERRITIN, TIBC, IRON, RETICCTPCT in the last 72 hours. Sepsis Labs: No results for input(s): PROCALCITON, LATICACIDVEN in the last 168 hours.  Recent Results (from the past 240 hour(s))  SARS CORONAVIRUS 2 (TAT 6-24 HRS) Nasopharyngeal Nasopharyngeal Swab     Status: None   Collection Time: 06/20/20  3:04 PM   Specimen: Nasopharyngeal Swab  Result Value Ref Range Status   SARS Coronavirus 2 NEGATIVE NEGATIVE Final    Comment: (NOTE) SARS-CoV-2 target nucleic acids are NOT DETECTED.  The SARS-CoV-2 RNA is generally detectable in upper and lower respiratory specimens during the acute phase of infection. Negative results do not preclude SARS-CoV-2 infection, do not rule out co-infections with other pathogens, and should not be used as the sole basis for treatment or other patient management decisions. Negative results must be combined with clinical observations, patient history, and epidemiological information. The expected result is Negative.  Fact Sheet for Patients: SugarRoll.be  Fact Sheet for Healthcare Providers: https://www.woods-mathews.com/  This test is not yet approved or cleared by the [place] FDA and  has been authorized for detection and/or diagnosis of SARS-CoV-2 by FDA under an Emergency Use Authorization (EUA). This EUA will remain  in effect (meaning this test can be used) for the duration of the COVID-19 declaration under Se ction 564(b)(1) of the Act, 21 U.S.C. section  360bbb-3(b)(1), unless the authorization is terminated or revoked sooner.  Performed at [facility], [place] [address] [place], [place] [address]   MRSA PCR Screening     Status: None   Collection Time: 06/23/20  5:46 AM   Specimen: Nasopharyngeal  Result Value Ref Range Status   MRSA by PCR NEGATIVE NEGATIVE Final    Comment:        The GeneXpert MRSA Assay (FDA approved for NASAL specimens only), is one component of a comprehensive MRSA colonization surveillance program. It is not intended to diagnose MRSA infection nor to guide or monitor treatment for MRSA infections. Performed at [facility], [address] [place], [place] [address]  Radiology Studies: DG Chest 2 View  Result Date: 06/22/2020 CLINICAL DATA:  Shortness of breath.  Hypotensive. EXAM: CHEST - 2 VIEW COMPARISON:  06/17/2020 FINDINGS: Complete opacification of the left lung with rightward shift of the mediastinum. Linear opacities in the right lung base. No visible pneumothorax. Cardiomediastinal silhouette is largely obscured. No acute osseous abnormality. IMPRESSION: 1. Complete opacification of the left lung, likely related to a large pleural effusion (increased from prior) and possibly overlying consolidation. There is rightward mediastinal shift. Correlate for evidence of tension physiology. 2. Linear opacities in the right lung base, which could represent atelectasis, aspiration, and/or pneumonia. Electronically Signed   By: Margaretha Sheffield MD   On: 06/22/2020 11:45   DG Chest Port 1 View  Result Date: 06/24/2020 CLINICAL DATA:  Chest tube. EXAM: PORTABLE CHEST 1 VIEW COMPARISON:  June 22, 2020. FINDINGS: Stable cardiomegaly. Stable position of left-sided chest tube. Moderate left pleural effusion is noted which is significantly enlarged compared to prior exam. Atelectasis or infiltrate is noted in the remaining portion of the aerated left lung. Right lung is clear. No  pneumothorax is noted. Bony thorax is unremarkable. IMPRESSION: Stable position of left-sided chest tube. Moderate left pleural effusion is noted which is significantly enlarged compared to prior exam. Atelectasis or infiltrate is noted in the remaining portion of the aerated left lung. Electronically Signed   By: Marijo Conception M.D.   On: 06/24/2020 08:43   DG Chest Port 1 View  Result Date: 06/22/2020 CLINICAL DATA:  Postop check.  Chest tube placement. EXAM: PORTABLE CHEST 1 VIEW COMPARISON:  Radiograph earlier this day.  Most recent CT 06/06/2019 FINDINGS: Pigtail catheter projects over the left mid lung. No visualized pneumothorax. Improved hazy opacity at the left lung base consistent with decreasing pleural effusion and associated airspace disease/atelectasis. Small volume of pleural fluid and or thickening persists. Slight increase in right infrahilar atelectasis. Stable heart size and mediastinal contours. IMPRESSION: 1. Left pigtail catheter in place. No visualized pneumothorax. 2. Improved hazy opacity at the left lung base consistent with decreasing pleural effusion and associated airspace disease/atelectasis. Small volume of pleural fluid and or thickening persists. Electronically Signed   By: Keith Rake M.D.   On: 06/22/2020 15:44   DG Chest Portable 1 View  Result Date: 06/22/2020 CLINICAL DATA:  Chest tube placement EXAM: PORTABLE CHEST 1 VIEW COMPARISON:  June 22, 2020 study obtained earlier in the day FINDINGS: Chest tube present on the left with considerable diminution in size of left pleural effusion. There is a fairly small residual left pleural effusion with patchy atelectasis in the left mid and lower lung regions. Lungs elsewhere are clear. Heart is upper normal in size with pulmonary vascularity normal. No adenopathy. No pneumothorax. No bone lesions. IMPRESSION: Left pleural effusion considerably smaller after chest tube placement. There is a fairly small persistent left  pleural effusion with atelectatic change in the left mid and lower lung regions. Lungs otherwise clear. Heart upper normal in size. Electronically Signed   By: Lowella Grip III M.D.   On: 06/22/2020 13:49        Scheduled Meds:  sodium chloride   Intravenous Once   Chlorhexidine Gluconate Cloth  6 each Topical Daily   cholecalciferol  1,000 Units Oral Daily   dutasteride  0.5 mg Oral Daily   fentaNYL (SUBLIMAZE) injection  25 mcg Intravenous Once   folic acid  1 mg Oral Daily   influenza vaccine adjuvanted  0.5 mL Intramuscular Tomorrow-1000  loratadine  10 mg Oral Daily   montelukast  10 mg Oral Daily   pantoprazole  20 mg Oral BID   rosuvastatin  10 mg Oral Daily   tamsulosin  0.4 mg Oral Daily   cyanocobalamin  1,000 mcg Oral Daily   Continuous Infusions:  sodium chloride 125 mL/hr at 06/24/20 0800     LOS: 2 days    Time spent: 34 mins     Wyvonnia Dusky, MD [facility] Pager [phone]  If 7PM-7AM, please contact night-coverage [email or web address] 06/24/2020, 8:54 AM

## 2020-06-24 NOTE — Progress Notes (Signed)
[facility] Follow Up Note  Patient ID: Dwayne Thompson, male   DOB: October 05, 1945, 74 y.o.   MRN: [id number]  HISTORY: Tolerated his CT guided biopsy.  Less short of breath.    Vitals:   06/24/20 1200 06/24/20 1300  BP:  99/70  Pulse:  (!) 107  Resp:  19  Temp: 97.7 F (36.5 C)   SpO2:  96%     EXAM:  Resp: Lungs are clear on the right and decreased at the left base.  No respiratory distress, normal effort. Heart:  Regular without murmurs Abd:  Abdomen is soft, non distended and non tender. No masses are palpable.  There is no rebound and no guarding.  Neurological: Alert and oriented to person, place, and time. Coordination normal.  Skin: Skin is warm and dry. No rash noted. No diaphoretic. No erythema. No pallor.  Psychiatric: Normal mood and affect. Normal behavior. Judgment and thought content normal.    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   MRN: [id number]

## 2020-06-25 ENCOUNTER — Inpatient Hospital Stay: Payer: Medicare Other

## 2020-06-25 DIAGNOSIS — J942 Hemothorax: Secondary | ICD-10-CM | POA: Diagnosis not present

## 2020-06-25 DIAGNOSIS — J9601 Acute respiratory failure with hypoxia: Secondary | ICD-10-CM | POA: Diagnosis not present

## 2020-06-25 DIAGNOSIS — J948 Other specified pleural conditions: Secondary | ICD-10-CM | POA: Diagnosis not present

## 2020-06-25 LAB — CBC
HCT: 27.6 % — ABNORMAL LOW (ref 39.0–52.0)
Hemoglobin: 10 g/dL — ABNORMAL LOW (ref 13.0–17.0)
MCH: 31.3 pg (ref 26.0–34.0)
MCHC: 36.2 g/dL — ABNORMAL HIGH (ref 30.0–36.0)
MCV: 86.3 fL (ref 80.0–100.0)
Platelets: 178 10*3/uL (ref 150–400)
RBC: 3.2 MIL/uL — ABNORMAL LOW (ref 4.22–5.81)
RDW: 12.6 % (ref 11.5–15.5)
WBC: 6.6 10*3/uL (ref 4.0–10.5)
nRBC: 0 % (ref 0.0–0.2)

## 2020-06-25 LAB — BASIC METABOLIC PANEL
Anion gap: 6 (ref 5–15)
BUN: 13 mg/dL (ref 8–23)
CO2: 24 mmol/L (ref 22–32)
Calcium: 8.2 mg/dL — ABNORMAL LOW (ref 8.9–10.3)
Chloride: 104 mmol/L (ref 98–111)
Creatinine, Ser: 0.77 mg/dL (ref 0.61–1.24)
GFR calc non Af Amer: >60 mL/min (ref >60)
Glucose, Bld: 94 mg/dL (ref 70–99)
Potassium: 4.5 mmol/L (ref 3.5–5.1)
Sodium: 134 mmol/L — ABNORMAL LOW (ref 135–145)

## 2020-06-25 LAB — MAGNESIUM: Magnesium: 1.6 mg/dL — ABNORMAL LOW (ref 1.7–2.4)

## 2020-06-25 LAB — PHOSPHORUS: Phosphorus: 3.5 mg/dL (ref 2.5–4.6)

## 2020-06-25 MED ORDER — OXYCODONE HCL 5 MG PO TABS
10.0000 mg | ORAL_TABLET | ORAL | Status: DC | PRN
  Administered 2020-06-25 – 2020-07-02 (×25): 10 mg via ORAL
  Filled 2020-06-25 (×25): qty 2

## 2020-06-25 MED ORDER — ENSURE ENLIVE PO LIQD
237.0000 mL | Freq: Two times a day (BID) | ORAL | Status: DC
  Administered 2020-06-25 – 2020-07-02 (×13): 237 mL via ORAL

## 2020-06-25 MED ORDER — MAGNESIUM SULFATE 2 GM/50ML IV SOLN
2.0000 g | Freq: Once | INTRAVENOUS | Status: AC
  Administered 2020-06-25: 2 g via INTRAVENOUS
  Filled 2020-06-25: qty 50

## 2020-06-25 NOTE — Progress Notes (Signed)
   06/25/20 2219  Assess: MEWS Score  Temp 99.2 F (37.3 C)  BP 100/65  Pulse Rate (!) 105  Resp 18  SpO2 95 %  O2 Device Nasal Cannula  O2 Flow Rate (L/min) 2 L/min  Assess: MEWS Score  MEWS Temp 0  MEWS Systolic 1  MEWS Pulse 1  MEWS RR 0  MEWS LOC 0  MEWS Score 2  MEWS Score Color Yellow  Assess: if the MEWS score is Yellow or Red  Were vital signs taken at a resting state? Yes  Focused Assessment No change from prior assessment  Early Detection of Sepsis Score *See Row Information* Low  MEWS guidelines implemented *See Row Information* No, other (Comment) (recheck of vitals from 2201 )  Patient has been yellow with each assessment, no changes to current plan. Vitals are to be taken in his right ankle

## 2020-06-25 NOTE — Progress Notes (Signed)
Dr Jimmye Norman informed that I put the patient on 2 liters [place]. His sats were hanging 85-88% on room air. 92% on 2 liters

## 2020-06-25 NOTE — Progress Notes (Signed)
Patient ID: Dwayne Thompson, male   DOB: 09/29/1945, 74 y.o.   MRN: [id number]  Patient comes without any specific complaints this morning except for his continued chest pain.  His chest tube is draining minimal amounts of fluid.  We will await the results of the pathology before consideration of additional diagnostic or therapeutic steps.  If the pathology does not reveal any specific diagnosis then I think thoracoscopy and biopsy would be appropriate.  If he does have a specific diagnosis made then we can consider palliative management of his pleural effusion.

## 2020-06-25 NOTE — Progress Notes (Signed)
Patient continues to be in significant pain. Order received from Dr Jimmye Norman to increase oxycodone to 10mg  [facility]

## 2020-06-25 NOTE — Progress Notes (Addendum)
PROGRESS NOTE    Dwayne Thompson  KXF:[id number] DOB: [birth year] DOA: 06/22/2020 PCP: Jerrol Banana., MD   Assessment & Plan:   Principal Problem:   Acute respiratory failure [facility]) Active Problems:   BPH with obstruction/lower urinary tract symptoms   Thrombocytopenia ([place])   Hypertension   Cirrhosis ([place])   Hemothorax on left   Pleural mass   Bone metastasis ([facility])   Pressure injury of skin   Hemothorax    Acute hypoxic respiratory failure: likely secondary to hemothorax. S/p chest tube placement. Fentanyl, oxycodone prn for pain. Weaned off of supplemental oxygen. Gen surg following and recs apprec.  Pleural-based mass: etiology unclear, likely malignancy. S/p IR guided biopsy 06/24/20. Pathology pending. Oncology following and recs apprec. Hx of 50 pack year, quit smoking in 2015.   AKI: likely pre-renal. Resolved   Left hemothorax: etiology unclear. Pt denies any trauma. Possible malignancy. S/p CT guided biopsy 06/24/20 and awaiting path results  Hyperkalemia: resolved   GERD: continue on pantoprazole  BPH: continue on home dose of flomax, avodart   Cirrhosis: not on lactulose, xifaxan at home. Stable. Will need to f/u outpatient w/ GI   ACD: H&H are stable. Will continue to monitor      DVT prophylaxis: SCDs Code Status: full  Family Communication: discussed pt's care w/ pt's family at bedside and answered their questions  Disposition Plan: depends on PT/OT recs  Status is: Inpatient  Remains inpatient appropriate because:Ongoing diagnostic testing needed not appropriate for outpatient work up, IV treatments appropriate due to intensity of illness or inability to take PO and Inpatient level of care appropriate due to severity of illness   Dispo: The patient is from: Home              Anticipated d/c is to: Home vs SNF               Anticipated d/c date is: > 3 days              Patient currently is not medically stable to d/c.    Consultants:    Onco IR   Procedures:   Antimicrobials:   Subjective: Pt c/o lateral chest wall pain   Objective: Vitals:   06/24/20 1800 06/25/20 0021 06/25/20 0345 06/25/20 0713  BP: 110/62 102/60 94/61 103/70  Pulse: (!) 106  97 (!) 101  Resp: 20 18 16 20   Temp: 98.1 F (36.7 C) 98 F (36.7 C) 97.9 F (36.6 C) 98.2 F (36.8 C)  TempSrc: Oral Oral Oral Oral  SpO2: 91% 95% 93% 95%  Weight:      Height:        Intake/Output Summary (Last 24 hours) at 06/25/2020 0729 Last data filed at 06/25/2020 0717 Gross per 24 hour  Intake 172.85 ml  Output 1580 ml  Net -1407.15 ml   Filed Weights   06/22/20 1112 06/23/20 0545  Weight: 76.2 kg 86.8 kg    Examination:  General exam: Appears uncomfortable  Respiratory system: decreased breath sounds b/l. No wheezes Cardiovascular system: S1/S2+. No rubs or gallops Gastrointestinal system: Abdomen is nondistended, soft and nontender. Normal bowel sounds heard. Central nervous system: Alert and oriented. Moves all 4 extremities  Psychiatry: Judgement and insight appear normal. Flat mood and affect     Data Reviewed: I have personally reviewed following labs and imaging studies  CBC: Recent Labs  Lab 06/23/20 1111 06/23/20 1822 06/24/20 0224 06/24/20 1223 06/25/20 0530  WBC 7.0 6.5 6.3 6.7 6.6  HGB 9.9* 10.0* 9.7* 10.2* 10.0*  HCT 28.8* 29.2* 27.9* 29.3* 27.6*  MCV 89.7 89.8 88.9 89.6 86.3  PLT 185 194 195 186 696   Basic Metabolic Panel: Recent Labs  Lab 06/22/20 1118 06/23/20 0507 06/24/20 0224 06/25/20 0530  NA 131* 135 134* 134*  K 5.5* 5.1 4.8 4.5  CL 100 105 105 104  CO2 21* 21* 23 24  GLUCOSE 106* 84 94 94  BUN 70* 50* 24* 13  CREATININE 2.75* 1.36* 0.90 0.77  CALCIUM 8.9 8.6* 8.1* 8.2*  MG  --   --  1.8 1.6*  PHOS  --   --  3.1 3.5   GFR: Estimated Creatinine Clearance: 86.3 mL/min (by C-G formula based on SCr of 0.77 mg/dL). Liver Function Tests: No results for input(s): AST, ALT, ALKPHOS, BILITOT, PROT,  ALBUMIN in the last 168 hours. No results for input(s): LIPASE, AMYLASE in the last 168 hours. No results for input(s): AMMONIA in the last 168 hours. Coagulation Profile: Recent Labs  Lab 06/22/20 1404  INR 1.2   Cardiac Enzymes: No results for input(s): CKTOTAL, CKMB, CKMBINDEX, TROPONINI in the last 168 hours. BNP (last 3 results) No results for input(s): PROBNP in the last 8760 hours. HbA1C: No results for input(s): HGBA1C in the last 72 hours. CBG: Recent Labs  Lab 06/23/20 0541 06/24/20 0737  GLUCAP 77 86   Lipid Profile: No results for input(s): CHOL, HDL, LDLCALC, TRIG, CHOLHDL, LDLDIRECT in the last 72 hours. Thyroid Function Tests: No results for input(s): TSH, T4TOTAL, FREET4, T3FREE, THYROIDAB in the last 72 hours. Anemia Panel: No results for input(s): VITAMINB12, FOLATE, FERRITIN, TIBC, IRON, RETICCTPCT in the last 72 hours. Sepsis Labs: No results for input(s): PROCALCITON, LATICACIDVEN in the last 168 hours.  Recent Results (from the past 240 hour(s))  SARS CORONAVIRUS 2 (TAT 6-24 HRS) Nasopharyngeal Nasopharyngeal Swab     Status: None   Collection Time: 06/20/20  3:04 PM   Specimen: Nasopharyngeal Swab  Result Value Ref Range Status   SARS Coronavirus 2 NEGATIVE NEGATIVE Final    Comment: (NOTE) SARS-CoV-2 target nucleic acids are NOT DETECTED.  The SARS-CoV-2 RNA is generally detectable in upper and lower respiratory specimens during the acute phase of infection. Negative results do not preclude SARS-CoV-2 infection, do not rule out co-infections with other pathogens, and should not be used as the sole basis for treatment or other patient management decisions. Negative results must be combined with clinical observations, patient history, and epidemiological information. The expected result is Negative.  Fact Sheet for Patients: SugarRoll.be  Fact Sheet for Healthcare  Providers: https://www.woods-mathews.com/  This test is not yet approved or cleared by the [place] FDA and  has been authorized for detection and/or diagnosis of SARS-CoV-2 by FDA under an Emergency Use Authorization (EUA). This EUA will remain  in effect (meaning this test can be used) for the duration of the COVID-19 declaration under Se ction 564(b)(1) of the Act, 21 U.S.C. section 360bbb-3(b)(1), unless the authorization is terminated or revoked sooner.  Performed at [facility], [place] [address] [place], [place] [address]   MRSA PCR Screening     Status: None   Collection Time: 06/23/20  5:46 AM   Specimen: Nasopharyngeal  Result Value Ref Range Status   MRSA by PCR NEGATIVE NEGATIVE Final    Comment:        The GeneXpert MRSA Assay (FDA approved for NASAL specimens only), is one component of a comprehensive MRSA colonization surveillance program.  It is not intended to diagnose MRSA infection nor to guide or monitor treatment for MRSA infections. Performed at [facility], [address] [place], [place] [address]          Radiology Studies: CT BIOPSY  Result Date: 07/04/20 INDICATION: 74 year old male with a history of suspected pleural metastases/chest wall mass, FDG avid on prior PET referred for biopsy EXAM: CT BIOPSY MEDICATIONS: None. ANESTHESIA/SEDATION: Moderate (conscious) sedation was employed during this procedure. A total of Versed 0.5 mg and Fentanyl 25 mcg was administered intravenously. Moderate Sedation Time: 10 minutes. The patient's level of consciousness and vital signs were monitored continuously by radiology nursing throughout the procedure under my direct supervision. FLUOROSCOPY TIME:  CT COMPLICATIONS: None PROCEDURE: The procedure, risks, benefits, and alternatives were explained to the patient and the patient's family. Specific risks that were addressed included bleeding, infection, pneumothorax, need for  further procedure including chest tube placement, chance of delayed pneumothorax or hemorrhage, hemoptysis, nondiagnostic sample, cardiopulmonary collapse, death. Questions regarding the procedure were encouraged and answered. The patient understands and consents to the procedure. Patient was positioned in the supine position on the CT gantry table and a scout CT of the chest was performed for planning purposes. Once angle of approach was determined, the skin and subcutaneous tissues this scan was prepped and draped in the usual sterile fashion, and a sterile drape was applied covering the operative field. A sterile gown and sterile gloves were used for the procedure. Local anesthesia was provided with 1% Lidocaine. The skin and subcutaneous tissues were infiltrated 1% lidocaine for local anesthesia, and a small stab incision was made with an 11 blade scalpel. Using CT guidance, a 17 gauge trocar needle was advanced into the left lower anterior chest walltarget. After confirmation of the tip, separate 18 gauge core biopsies were performed. These were placed into solution for transportation to the lab. Final CT was acquired Patient tolerated the procedure well and remained hemodynamically stable throughout. No complications were encountered and no significant blood loss was encounter IMPRESSION: Status post CT-guided biopsy of left chest wall mass, FDG avid on prior PET. Signed, Dulcy Fanny. Dellia Nims, RPVI [facility] Electronically Signed   By: Corrie Mckusick D.O.   On: July 04, 2020 12:20   DG Chest Port 1 View  Result Date: 06/25/2020 CLINICAL DATA:  Chest tube. EXAM: PORTABLE CHEST 1 VIEW COMPARISON:  Jul 04, 2020. FINDINGS: Left chest tube in stable position. No pneumothorax. Prominent skin folds again noted on the right. Persistent left base atelectasis/infiltrate. Small left pleural effusion. No pneumothorax. Cardiomegaly. Degenerative changes scoliosis  thoracic spine. IMPRESSION: 1.  Left chest tube in stable position. No pneumothorax. 2. Persistent left base atelectasis/infiltrate. Small left pleural effusion. Similar findings on prior exam. Electronically Signed   By: [place]   On: 06/25/2020 05:01   DG Chest Port 1 View  Result Date: 07/04/2020 CLINICAL DATA:  Chest tube. EXAM: PORTABLE CHEST 1 VIEW COMPARISON:  June 22, 2020. FINDINGS: Stable cardiomegaly. Stable position of left-sided chest tube. Moderate left pleural effusion is noted which is significantly enlarged compared to prior exam. Atelectasis or infiltrate is noted in the remaining portion of the aerated left lung. Right lung is clear. No pneumothorax is noted. Bony thorax is unremarkable. IMPRESSION: Stable position of left-sided chest tube. Moderate left pleural effusion is noted which is significantly enlarged compared to prior exam. Atelectasis or infiltrate is noted in the remaining portion of the aerated left lung. Electronically Signed  By: Marijo Conception M.D.   On: 06/24/2020 08:43        Scheduled Meds: . sodium chloride   Intravenous Once  . Chlorhexidine Gluconate Cloth  6 each Topical Daily  . cholecalciferol  1,000 Units Oral Daily  . docusate sodium  100 mg Oral Daily  . dutasteride  0.5 mg Oral Daily  . folic acid  1 mg Oral Daily  . influenza vaccine adjuvanted  0.5 mL Intramuscular Tomorrow-1000  . loratadine  10 mg Oral Daily  . montelukast  10 mg Oral Daily  . pantoprazole  20 mg Oral BID  . polyethylene glycol  17 g Oral Daily  . rosuvastatin  10 mg Oral Daily  . tamsulosin  0.4 mg Oral Daily  . cyanocobalamin  1,000 mcg Oral Daily   Continuous Infusions:    LOS: 3 days    Time spent: 36 mins     Wyvonnia Dusky, MD [facility] Pager [phone]  If 7PM-7AM, please contact night-coverage [email or web address] 06/25/2020, 7:29 AM

## 2020-06-25 NOTE — Progress Notes (Signed)
Taken in patient's right ankle

## 2020-06-25 NOTE — Care Management Important Message (Signed)
Important Message  Patient Details  Name: Dwayne Thompson MRN: [id number] Date of Birth: 1946-02-23   Medicare Important Message Given:  Yes     [name] 06/25/2020, 2:06 PM

## 2020-06-25 NOTE — Progress Notes (Signed)
Ch visited with Pt and Pt's wife [name] as per recommendation by Palliative Care nurse and Chaplain. [place]. Ch followed up from yesterday's visit. [name] reported that the biopsy results have not come back yet, and that Pt was in a lot of pain and was being given pain medication. Pt was drowsy. Ch checked in with [name]'s cancer return; she says that she is back on treatment. They were grateful for visit. They will request chaplain visit if further support is needed.

## 2020-06-26 ENCOUNTER — Other Ambulatory Visit: Payer: Self-pay | Admitting: Hematology and Oncology

## 2020-06-26 DIAGNOSIS — J942 Hemothorax: Secondary | ICD-10-CM | POA: Diagnosis not present

## 2020-06-26 DIAGNOSIS — J9601 Acute respiratory failure with hypoxia: Secondary | ICD-10-CM | POA: Diagnosis not present

## 2020-06-26 DIAGNOSIS — J948 Other specified pleural conditions: Secondary | ICD-10-CM | POA: Diagnosis not present

## 2020-06-26 LAB — BASIC METABOLIC PANEL
Anion gap: 8 (ref 5–15)
BUN: 16 mg/dL (ref 8–23)
CO2: 24 mmol/L (ref 22–32)
Calcium: 8.3 mg/dL — ABNORMAL LOW (ref 8.9–10.3)
Chloride: 102 mmol/L (ref 98–111)
Creatinine, Ser: 0.93 mg/dL (ref 0.61–1.24)
GFR calc non Af Amer: >60 mL/min (ref >60)
Glucose, Bld: 96 mg/dL (ref 70–99)
Potassium: 5.1 mmol/L (ref 3.5–5.1)
Sodium: 134 mmol/L — ABNORMAL LOW (ref 135–145)

## 2020-06-26 LAB — CBC
HCT: 27.6 % — ABNORMAL LOW (ref 39.0–52.0)
Hemoglobin: 9.6 g/dL — ABNORMAL LOW (ref 13.0–17.0)
MCH: 31.2 pg (ref 26.0–34.0)
MCHC: 34.8 g/dL (ref 30.0–36.0)
MCV: 89.6 fL (ref 80.0–100.0)
Platelets: 157 10*3/uL (ref 150–400)
RBC: 3.08 MIL/uL — ABNORMAL LOW (ref 4.22–5.81)
RDW: 12.6 % (ref 11.5–15.5)
WBC: 7.1 10*3/uL (ref 4.0–10.5)
nRBC: 0 % (ref 0.0–0.2)

## 2020-06-26 LAB — MAGNESIUM: Magnesium: 1.8 mg/dL (ref 1.7–2.4)

## 2020-06-26 LAB — SURGICAL PATHOLOGY

## 2020-06-26 LAB — PHOSPHORUS: Phosphorus: 4.5 mg/dL (ref 2.5–4.6)

## 2020-06-26 MED ORDER — LIDOCAINE 5 % EX PTCH
1.0000 | MEDICATED_PATCH | CUTANEOUS | Status: DC
  Administered 2020-06-26 – 2020-07-02 (×6): 1 via TRANSDERMAL
  Filled 2020-06-26 (×8): qty 1

## 2020-06-26 MED ORDER — SODIUM CHLORIDE 0.9 % IV SOLN
INTRAVENOUS | Status: DC | PRN
  Administered 2020-06-26: 250 mL via INTRAVENOUS

## 2020-06-26 MED ORDER — MIDODRINE HCL 5 MG PO TABS
10.0000 mg | ORAL_TABLET | Freq: Once | ORAL | Status: AC
  Administered 2020-06-26: 10 mg via ORAL
  Filled 2020-06-26: qty 2

## 2020-06-26 MED ORDER — ALBUMIN HUMAN 25 % IV SOLN
25.0000 g | Freq: Once | INTRAVENOUS | Status: AC
  Administered 2020-06-26: 25 g via INTRAVENOUS
  Filled 2020-06-26: qty 100

## 2020-06-26 NOTE — Progress Notes (Signed)
PROGRESS NOTE    Dwayne Thompson  NAT:[id number] DOB: March 28, 1946 DOA: 06/22/2020 PCP: Jerrol Banana., MD   Assessment & Plan:   Principal Problem:   Acute respiratory failure [facility]) Active Problems:   BPH with obstruction/lower urinary tract symptoms   Thrombocytopenia ([place])   Hypertension   Cirrhosis ([place])   Hemothorax on left   Pleural mass   Bone metastasis ([facility])   Pressure injury of skin   Hemothorax    Acute hypoxic respiratory failure: likely secondary to hemothorax. S/p chest tube placement. Fentanyl, oxycodone prn for pain. Lidocaine patch ordered as well. Weaned off of supplemental oxygen. Gen surg following and recs apprec.  Pleural-based mass: etiology unclear, likely malignancy. S/p IR guided biopsy 06/24/20. Pathology pending. Oncology following and recs apprec. Hx of 50 pack year, quit smoking in 2015.   AKI: likely pre-renal. Resolved   Left hemothorax: etiology unclear. Pt denies any trauma. Possible malignancy. S/p CT guided biopsy 06/24/20 and awaiting path results  Hyperkalemia: resolved   GERD: continue on PPI   BPH: continue on home dose of dutasteride, tamsulosin   Cirrhosis: not on lactulose, xifaxan at home. Stable. Will need to f/u outpatient w/ GI   ACD: no need for a transfusion at this time. Will continue to monitor      DVT prophylaxis: SCDs Code Status: full  Family Communication: discussed pt's care w/ pt's wife at bedside and answered her questions  Disposition Plan: depends on PT/OT recs  Status is: Inpatient  Remains inpatient appropriate because:Ongoing diagnostic testing needed not appropriate for outpatient work up, IV treatments appropriate due to intensity of illness or inability to take PO and Inpatient level of care appropriate due to severity of illness   Dispo: The patient is from: Home              Anticipated d/c is to: Home vs SNF               Anticipated d/c date is: > 3 days              Patient currently  is not medically stable to d/c.    Consultants:  Onco IR   Procedures:   Antimicrobials:   Subjective: Pt still c/o lateral chest wall pain slightly improved from day prior   Objective: Vitals:   06/25/20 2201 06/25/20 2219 06/26/20 0000 06/26/20 0604  BP: 91/60 100/65 102/68 90/60  Pulse: (!) 105 (!) 105 100 88  Resp: 16 18 16    Temp: 97.7 F (36.5 C) 99.2 F (37.3 C) 98.3 F (36.8 C) 98.1 F (36.7 C)  TempSrc: Oral Oral  Oral  SpO2: 96% 95% 96% 96%  Weight:      Height:        Intake/Output Summary (Last 24 hours) at 06/26/2020 0717 Last data filed at 06/25/2020 1855 Gross per 24 hour  Intake 240 ml  Output 575 ml  Net -335 ml   Filed Weights   06/22/20 1112 06/23/20 0545  Weight: 76.2 kg 86.8 kg    Examination:  General exam: Appears uncomfortable  Respiratory system: diminished breath sounds b/l. No rales  Cardiovascular system: S1/S2+. No rubs or gallops Gastrointestinal system: Abdomen is nondistended, soft and nontender. Normal bowel sounds heard. Central nervous system: Alert and oriented. Moves all 4 extremities  Psychiatry: Judgement and insight appear normal. Flat mood and affect      Data Reviewed: I have personally reviewed following labs and imaging studies  CBC: Recent Labs  Lab 06/23/20 1822 06/24/20 0224 06/24/20 1223 06/25/20 0530 06/26/20 0553  WBC 6.5 6.3 6.7 6.6 7.1  HGB 10.0* 9.7* 10.2* 10.0* 9.6*  HCT 29.2* 27.9* 29.3* 27.6* 27.6*  MCV 89.8 88.9 89.6 86.3 89.6  PLT 194 195 186 178 580   Basic Metabolic Panel: Recent Labs  Lab 06/22/20 1118 06/23/20 0507 06/24/20 0224 06/25/20 0530 06/26/20 0553  NA 131* 135 134* 134* 134*  K 5.5* 5.1 4.8 4.5 5.1  CL 100 105 105 104 102  CO2 21* 21* 23 24 24   GLUCOSE 106* 84 94 94 96  BUN 70* 50* 24* 13 16  CREATININE 2.75* 1.36* 0.90 0.77 0.93  CALCIUM 8.9 8.6* 8.1* 8.2* 8.3*  MG  --   --  1.8 1.6* 1.8  PHOS  --   --  3.1 3.5 4.5   GFR: Estimated Creatinine Clearance:  74.2 mL/min (by C-G formula based on SCr of 0.93 mg/dL). Liver Function Tests: No results for input(s): AST, ALT, ALKPHOS, BILITOT, PROT, ALBUMIN in the last 168 hours. No results for input(s): LIPASE, AMYLASE in the last 168 hours. No results for input(s): AMMONIA in the last 168 hours. Coagulation Profile: Recent Labs  Lab 06/22/20 1404  INR 1.2   Cardiac Enzymes: No results for input(s): CKTOTAL, CKMB, CKMBINDEX, TROPONINI in the last 168 hours. BNP (last 3 results) No results for input(s): PROBNP in the last 8760 hours. HbA1C: No results for input(s): HGBA1C in the last 72 hours. CBG: Recent Labs  Lab 06/23/20 0541 06/24/20 0737  GLUCAP 77 86   Lipid Profile: No results for input(s): CHOL, HDL, LDLCALC, TRIG, CHOLHDL, LDLDIRECT in the last 72 hours. Thyroid Function Tests: No results for input(s): TSH, T4TOTAL, FREET4, T3FREE, THYROIDAB in the last 72 hours. Anemia Panel: No results for input(s): VITAMINB12, FOLATE, FERRITIN, TIBC, IRON, RETICCTPCT in the last 72 hours. Sepsis Labs: No results for input(s): PROCALCITON, LATICACIDVEN in the last 168 hours.  Recent Results (from the past 240 hour(s))  SARS CORONAVIRUS 2 (TAT 6-24 HRS) Nasopharyngeal Nasopharyngeal Swab     Status: None   Collection Time: 06/20/20  3:04 PM   Specimen: Nasopharyngeal Swab  Result Value Ref Range Status   SARS Coronavirus 2 NEGATIVE NEGATIVE Final    Comment: (NOTE) SARS-CoV-2 target nucleic acids are NOT DETECTED.  The SARS-CoV-2 RNA is generally detectable in upper and lower respiratory specimens during the acute phase of infection. Negative results do not preclude SARS-CoV-2 infection, do not rule out co-infections with other pathogens, and should not be used as the sole basis for treatment or other patient management decisions. Negative results must be combined with clinical observations, patient history, and epidemiological information. The expected result is Negative.  Fact  Sheet for Patients: SugarRoll.be  Fact Sheet for Healthcare Providers: https://www.woods-mathews.com/  This test is not yet approved or cleared by the [place] FDA and  has been authorized for detection and/or diagnosis of SARS-CoV-2 by FDA under an Emergency Use Authorization (EUA). This EUA will remain  in effect (meaning this test can be used) for the duration of the COVID-19 declaration under Se ction 564(b)(1) of the Act, 21 U.S.C. section 360bbb-3(b)(1), unless the authorization is terminated or revoked sooner.  Performed at [facility], [place] [address], [place], [place] [address]   MRSA PCR Screening     Status: None   Collection Time: 06/23/20  5:46 AM   Specimen: Nasopharyngeal  Result Value Ref Range Status   MRSA by PCR NEGATIVE NEGATIVE Final  Comment:        The GeneXpert MRSA Assay (FDA approved for NASAL specimens only), is one component of a comprehensive MRSA colonization surveillance program. It is not intended to diagnose MRSA infection nor to guide or monitor treatment for MRSA infections. Performed at [facility], [address] [place], [place] [address]          Radiology Studies: CT BIOPSY  Result Date: 07-17-20 INDICATION: 74 year old male with a history of suspected pleural metastases/chest wall mass, FDG avid on prior PET referred for biopsy EXAM: CT BIOPSY MEDICATIONS: None. ANESTHESIA/SEDATION: Moderate (conscious) sedation was employed during this procedure. A total of Versed 0.5 mg and Fentanyl 25 mcg was administered intravenously. Moderate Sedation Time: 10 minutes. The patient's level of consciousness and vital signs were monitored continuously by radiology nursing throughout the procedure under my direct supervision. FLUOROSCOPY TIME:  CT COMPLICATIONS: None PROCEDURE: The procedure, risks, benefits, and alternatives were explained to the patient and the patient's family.  Specific risks that were addressed included bleeding, infection, pneumothorax, need for further procedure including chest tube placement, chance of delayed pneumothorax or hemorrhage, hemoptysis, nondiagnostic sample, cardiopulmonary collapse, death. Questions regarding the procedure were encouraged and answered. The patient understands and consents to the procedure. Patient was positioned in the supine position on the CT gantry table and a scout CT of the chest was performed for planning purposes. Once angle of approach was determined, the skin and subcutaneous tissues this scan was prepped and draped in the usual sterile fashion, and a sterile drape was applied covering the operative field. A sterile gown and sterile gloves were used for the procedure. Local anesthesia was provided with 1% Lidocaine. The skin and subcutaneous tissues were infiltrated 1% lidocaine for local anesthesia, and a small stab incision was made with an 11 blade scalpel. Using CT guidance, a 17 gauge trocar needle was advanced into the left lower anterior chest walltarget. After confirmation of the tip, separate 18 gauge core biopsies were performed. These were placed into solution for transportation to the lab. Final CT was acquired Patient tolerated the procedure well and remained hemodynamically stable throughout. No complications were encountered and no significant blood loss was encounter IMPRESSION: Status post CT-guided biopsy of left chest wall mass, FDG avid on prior PET. Signed, Dulcy Fanny. Dellia Nims, RPVI [facility] Electronically Signed   By: Corrie Mckusick D.O.   On: 17-Jul-2020 12:20   DG Chest Port 1 View  Result Date: 06/25/2020 CLINICAL DATA:  Chest tube. EXAM: PORTABLE CHEST 1 VIEW COMPARISON:  2020-07-17. FINDINGS: Left chest tube in stable position. No pneumothorax. Prominent skin folds again noted on the right. Persistent left base atelectasis/infiltrate. Small  left pleural effusion. No pneumothorax. Cardiomegaly. Degenerative changes scoliosis thoracic spine. IMPRESSION: 1.  Left chest tube in stable position. No pneumothorax. 2. Persistent left base atelectasis/infiltrate. Small left pleural effusion. Similar findings on prior exam. Electronically Signed   By: [place]   On: 06/25/2020 05:01   DG Chest Port 1 View  Result Date: July 17, 2020 CLINICAL DATA:  Chest tube. EXAM: PORTABLE CHEST 1 VIEW COMPARISON:  June 22, 2020. FINDINGS: Stable cardiomegaly. Stable position of left-sided chest tube. Moderate left pleural effusion is noted which is significantly enlarged compared to prior exam. Atelectasis or infiltrate is noted in the remaining portion of the aerated left lung. Right lung is clear. No pneumothorax is noted. Bony thorax is unremarkable. IMPRESSION: Stable position of left-sided chest tube. Moderate left pleural  effusion is noted which is significantly enlarged compared to prior exam. Atelectasis or infiltrate is noted in the remaining portion of the aerated left lung. Electronically Signed   By: Marijo Conception M.D.   On: 06/24/2020 08:43        Scheduled Meds: . sodium chloride   Intravenous Once  . cholecalciferol  1,000 Units Oral Daily  . docusate sodium  100 mg Oral Daily  . dutasteride  0.5 mg Oral Daily  . feeding supplement (ENSURE ENLIVE)  237 mL Oral BID BM  . folic acid  1 mg Oral Daily  . influenza vaccine adjuvanted  0.5 mL Intramuscular Tomorrow-1000  . loratadine  10 mg Oral Daily  . montelukast  10 mg Oral Daily  . pantoprazole  20 mg Oral BID  . polyethylene glycol  17 g Oral Daily  . rosuvastatin  10 mg Oral Daily  . tamsulosin  0.4 mg Oral Daily  . cyanocobalamin  1,000 mcg Oral Daily   Continuous Infusions:    LOS: 4 days    Time spent: 38 mins     Wyvonnia Dusky, MD [facility] Pager [phone]  If 7PM-7AM, please contact night-coverage [email or web address] 06/26/2020, 7:17 AM

## 2020-06-26 NOTE — Progress Notes (Signed)
CT tipped when pt got up to use urinal, fluid in multiple chambers. Changed out CT canister, pt with no distress returned to 20- suction.

## 2020-06-26 NOTE — Progress Notes (Signed)
   06/26/20 1948  Vitals  Temp 99 F (37.2 C)  Temp Source Oral  BP (!) 83/57 (Reported to RN Nurse.)  MAP (mmHg) (!) 64  BP Location Right Arm  BP Method Automatic  Patient Position (if appropriate) Lying  Pulse Rate (!) 103  Pulse Rate Source Dinamap  Resp 20  MEWS COLOR  MEWS Score Color Yellow  Oxygen Therapy  SpO2 98 %  O2 Device Room Air  Pain Assessment  Pain Scale 0-10  Pain Score 4  MEWS Score  MEWS Temp 0  MEWS Systolic 1  MEWS Pulse 1  MEWS RR 0  MEWS LOC 0  MEWS Score 2  Provider Notification  Provider Name/Title Rachael Fee  Date Provider Notified 06/26/20  Time Provider Notified 2240  Notification Type Page  Notification Reason Change in status (low b/p)  Response See new orders  Date of Provider Response 06/26/20  Time of Provider Response 2240

## 2020-06-26 NOTE — Progress Notes (Signed)
Vitals signs did not transfer to foresheet, had to manually enter

## 2020-06-26 NOTE — Progress Notes (Signed)
[facility] Hematology/Oncology Progress Note  Date of admission: 06/22/2020  Hospital day:  06/26/2020  Chief Complaint: Dwayne Thompson is a 74 y.o. male with recurrent left sided pleural effusion who was admitted through the emergency room with acute respiratory failure secondary to a hemothorax.  Subjective: Dwayne Thompson notes ongoing left chest discomfort.  Chest tube remains in place.  Social History: The patient is accompanied by Dwayne Thompson wife today.  Allergies:  Allergies  Allergen Reactions  . Indomethacin Swelling    ankle swelling  . Tamsulosin Hcl Hives  . Sulfa Antibiotics Rash    Scheduled Medications: . sodium chloride   Intravenous Once  . cholecalciferol  1,000 Units Oral Daily  . docusate sodium  100 mg Oral Daily  . dutasteride  0.5 mg Oral Daily  . feeding supplement (ENSURE ENLIVE)  237 mL Oral BID BM  . folic acid  1 mg Oral Daily  . lidocaine  1 patch Transdermal Q24H  . loratadine  10 mg Oral Daily  . montelukast  10 mg Oral Daily  . pantoprazole  20 mg Oral BID  . polyethylene glycol  17 g Oral Daily  . rosuvastatin  10 mg Oral Daily  . tamsulosin  0.4 mg Oral Daily  . cyanocobalamin  1,000 mcg Oral Daily    Review of Systems: GENERAL:  Fatigue.  No fevers, sweats.  Weight loss of 10 pounds prior to hospitalization. PERFORMANCE STATUS (ECOG):  2 HEENT:  No visual changes, sore throat, mouth sores or tenderness. Lungs: Shortness of breath.  Able to lay back slightly.  Pleuritic chest pain.  No hemoptysis. Cardiac:  Left sided chest pain.  No palpitations. GI:  Constipation.  No nausea, vomiting, diarrhea, melena or hematochezia. GU:  No urgency, frequency, dysuria, or hematuria. Musculoskeletal:  No back pain.  No joint pain.  No muscle tenderness. Extremities: Lower extremity swelling. Skin:  No rashes or skin changes. Neuro:  General weakness.  No headache, numbness or weakness, balance or coordination issues. Psych:  No mood changes,  depression or anxiety. Pain:  Left sided chest pain. Review of systems:  All other systems reviewed and found to be negative.  Physical Exam: Blood pressure (!) 86/55, pulse (!) 102, temperature 98.2 F (36.8 C), temperature source Oral, resp. rate 17, height 5\' 11"  (1.803 m), weight 191 lb 5.8 oz (86.8 kg), SpO2 91 %.  GENERAL:  Fatigued appearing gentleman sitting comfortably on the medical unit in no acute distress. MENTAL STATUS:  Alert and oriented to person, place and time. HEAD:  Short graying hair.  Normocephalic, atraumatic, face symmetric, no Cushingoid features. EYES:  Glasses.  Brown eyes.  Pupils equal round and reactive to light and accomodation.  No conjunctivitis or scleral icterus. RESPIRATORY:  Decreased breath sounds left base.  No rales, wheezes or rhonchi. Left sided chest tube. CARDIOVASCULAR:  Regular rate and rhythm without murmur, rub or gallop. ABDOMEN:  Soft, non-tender, with active bowel sounds, and no hepatosplenomegaly.  No masses. SKIN:  No rashes, ulcers or lesions. EXTREMITIES: Bilateral lower extremity edema (stable).  No palpable cords. NEUROLOGICAL: Unremarkable. PSYCH:  Appropriate.   Results for orders placed or performed during the hospital encounter of 06/22/20 (from the past 48 hour(s))  Basic metabolic panel     Status: Abnormal   Collection Time: 06/25/20  5:30 AM  Result Value Ref Range   Sodium 134 (L) 135 - 145 mmol/L   Potassium 4.5 3.5 - 5.1 mmol/L   Chloride 104 98 - 111  mmol/L   CO2 24 22 - 32 mmol/L   Glucose, Bld 94 70 - 99 mg/dL    Comment: Glucose reference range applies only to samples taken after fasting for at least 8 hours.   BUN 13 8 - 23 mg/dL   Creatinine, Ser 0.77 0.61 - 1.24 mg/dL   Calcium 8.2 (L) 8.9 - 10.3 mg/dL   GFR calc non Af Amer >60 >60 mL/min   Anion gap 6 5 - 15    Comment: Performed at [facility], [address] [place], [place] [address]  Magnesium     Status: Abnormal   Collection Time:  06/25/20  5:30 AM  Result Value Ref Range   Magnesium 1.6 (L) 1.7 - 2.4 mg/dL    Comment: Performed at [facility], [address] [place], [place] [address]  Phosphorus     Status: None   Collection Time: 06/25/20  5:30 AM  Result Value Ref Range   Phosphorus 3.5 2.5 - 4.6 mg/dL    Comment: Performed at [facility], [address] [place], [place] [address]  CBC     Status: Abnormal   Collection Time: 06/25/20  5:30 AM  Result Value Ref Range   WBC 6.6 4.0 - 10.5 K/uL   RBC 3.20 (L) 4.22 - 5.81 MIL/uL   Hemoglobin 10.0 (L) 13.0 - 17.0 g/dL   HCT 27.6 (L) 39 - 52 %   MCV 86.3 80.0 - 100.0 fL   MCH 31.3 26.0 - 34.0 pg   MCHC 36.2 (H) 30.0 - 36.0 g/dL   RDW 12.6 11.5 - 15.5 %   Platelets 178 150 - 400 K/uL   nRBC 0.0 0.0 - 0.2 %    Comment: Performed at [facility], [address], [place], [place] [address]  Basic metabolic panel     Status: Abnormal   Collection Time: 06/26/20  5:53 AM  Result Value Ref Range   Sodium 134 (L) 135 - 145 mmol/L   Potassium 5.1 3.5 - 5.1 mmol/L   Chloride 102 98 - 111 mmol/L   CO2 24 22 - 32 mmol/L   Glucose, Bld 96 70 - 99 mg/dL    Comment: Glucose reference range applies only to samples taken after fasting for at least 8 hours.   BUN 16 8 - 23 mg/dL   Creatinine, Ser 0.93 0.61 - 1.24 mg/dL   Calcium 8.3 (L) 8.9 - 10.3 mg/dL   GFR calc non Af Amer >60 >60 mL/min   Anion gap 8 5 - 15    Comment: Performed at [facility], [address], [place], [place] [address]  Magnesium     Status: None   Collection Time: 06/26/20  5:53 AM  Result Value Ref Range   Magnesium 1.8 1.7 - 2.4 mg/dL    Comment: Performed at [facility], [address] [place], [place] [address]  Phosphorus     Status: None   Collection Time: 06/26/20  5:53 AM  Result Value Ref Range   Phosphorus 4.5 2.5 - 4.6 mg/dL    Comment: Performed at [facility], [address] [place], [place] [address]  CBC      Status: Abnormal   Collection Time: 06/26/20  5:53 AM  Result Value Ref Range   WBC 7.1 4.0 - 10.5 K/uL   RBC 3.08 (L) 4.22 - 5.81 MIL/uL   Hemoglobin 9.6 (L) 13.0 - 17.0 g/dL   HCT 27.6 (L) 39 - 52 %  MCV 89.6 80.0 - 100.0 fL   MCH 31.2 26.0 - 34.0 pg   MCHC 34.8 30.0 - 36.0 g/dL   RDW 12.6 11.5 - 15.5 %   Platelets 157 150 - 400 K/uL   nRBC 0.0 0.0 - 0.2 %    Comment: Performed at [facility], [address] [place], [place] [address]   DG Chest Port 1 View  Result Date: 06/25/2020 CLINICAL DATA:  Chest tube. EXAM: PORTABLE CHEST 1 VIEW COMPARISON:  06/24/2020. FINDINGS: Left chest tube in stable position. No pneumothorax. Prominent skin folds again noted on the right. Persistent left base atelectasis/infiltrate. Small left pleural effusion. No pneumothorax. Cardiomegaly. Degenerative changes scoliosis thoracic spine. IMPRESSION: 1.  Left chest tube in stable position. No pneumothorax. 2. Persistent left base atelectasis/infiltrate. Small left pleural effusion. Similar findings on prior exam. Electronically Signed   By: [place]   On: 06/25/2020 05:01    Assessment:  GILL DELROSSI is a 74 y.o. male with extensive stage small cell lung cancer s/p left chest wall/pleural biopsy on 06/24/2020.  Dwayne Thompson presented with left hemothorax. Thoracentesis x2 (05/20/2020 and 06/05/2020) revealed bloody fluid with no evidence of malignancy.  Dwayne Thompson has a chest tube in place.  Chest CT on 06/05/2020 revealed pleural thickening, nodularity and pleural-based mass with central adenopathy versus mass suspicious for pulmonary neoplasm with extensive pleural metastasis.  Mesothelioma is in the differential.  There was increasing left subcarinal lymph node use into the left mainstem bronchus.  There was increasing upper abdominal lymph nodes.  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  There was  accentuated activity along the consolidative and atelectatic left lower lobe probably from tumor within the lung but potentially from tumor along the visceral pleura.  There was hypermetabolic thoracic adenopathy along with hypermetabolic upper abdominal adenopathy and hypermetabolic skeletal metastatic lesions (right iliac bone SUV 11.9 and T1). There was possible early tumor extension into the left T11-12 neuroforamen with some erosion of the adjacent T11 vertebral body.  CXR on 06/22/2020 revealed complete of opacification of the left lung secondary to a large pleural effusion.  There was right sided shift.  Dwayne Thompson has a history of an upper GI bleed secondary to AVMs, B12 and folate deficiency. Dwayne Thompson drinks alcohol daily.  Symptomatically, Dwayne Thompson has left chest discomfort.  Chest tube remains in place.  Plan:   1.   Extensive stage small cell lung cancer             Dwayne Thompson has a 50 pack year smoking history.             PET scan on 06/12/2020 revealed pleural based disease, hypermetabolic adenopathy, and skeletal metastasis.             Pleural biopsy on 06/24/2020 confirmed small cell lung cancer.  Discuss plan for head MRI to complete staging.  Thoracic spine MRI to assess T11 disease and neuroforamen extension.  Discuss treatment is palliative.  Discuss plan for carboplatin, etopside, and Tecentriq every 3 weeks in the outpatient department.   Potential side effects reviewed.   Information sheets on chemotherapy to be provided tomorrow.  Discuss plan for possible treatment next week.   Discuss plan for port-a-cath placement.   Discuss chemotherapy class.   Preauth chemotherapy.  Multiple questions asked and answered. 2.   Recurrent pleural effusion  Patient has a chest tube in place.  Discuss with Dr Genevive Bi options  until chemotherapy resolves effusion.   Pleur-X catheter or repeat thoracentesis. 3.   Normocytic anemia             Hematocrit 27.6.  Hemoglobin 9.6.  MCV 89.6.   Ferritin 112 with  an iron saturation of 13% (low) and TIBC 328 on 05/20/2020.   Check iron stores in AM.  Etiology secondary to hemothorax.           4.   Renal insufficiency             Creatinine 0.93 today.  Creatinine 2.75 on admission.             Baseline creatinine 0.85 - 1.12. 5.  Cancer-related pain             Symptomatically, pain has improved since admission.  Continue oxycodone 10 mg po q 4 hours prn pain.    Lequita Asal, MD  06/26/2020, 11:53 PM

## 2020-06-27 ENCOUNTER — Other Ambulatory Visit: Payer: Self-pay | Admitting: Hematology and Oncology

## 2020-06-27 DIAGNOSIS — Z9689 Presence of other specified functional implants: Secondary | ICD-10-CM

## 2020-06-27 DIAGNOSIS — J9601 Acute respiratory failure with hypoxia: Secondary | ICD-10-CM | POA: Diagnosis not present

## 2020-06-27 DIAGNOSIS — J942 Hemothorax: Secondary | ICD-10-CM | POA: Diagnosis not present

## 2020-06-27 DIAGNOSIS — C801 Malignant (primary) neoplasm, unspecified: Secondary | ICD-10-CM | POA: Diagnosis not present

## 2020-06-27 DIAGNOSIS — C3490 Malignant neoplasm of unspecified part of unspecified bronchus or lung: Secondary | ICD-10-CM

## 2020-06-27 LAB — IRON AND TIBC
Iron: 25 ug/dL — ABNORMAL LOW (ref 45–182)
Saturation Ratios: 14 % — ABNORMAL LOW (ref 17.9–39.5)
TIBC: 181 ug/dL — ABNORMAL LOW (ref 250–450)
UIBC: 156 ug/dL

## 2020-06-27 LAB — BASIC METABOLIC PANEL
Anion gap: 7 (ref 5–15)
BUN: 18 mg/dL (ref 8–23)
CO2: 27 mmol/L (ref 22–32)
Calcium: 8.6 mg/dL — ABNORMAL LOW (ref 8.9–10.3)
Chloride: 99 mmol/L (ref 98–111)
Creatinine, Ser: 0.88 mg/dL (ref 0.61–1.24)
GFR, Estimated: >60 mL/min (ref >60)
Glucose, Bld: 102 mg/dL — ABNORMAL HIGH (ref 70–99)
Potassium: 4.7 mmol/L (ref 3.5–5.1)
Sodium: 133 mmol/L — ABNORMAL LOW (ref 135–145)

## 2020-06-27 LAB — CBC
HCT: 29.4 % — ABNORMAL LOW (ref 39.0–52.0)
Hemoglobin: 10.1 g/dL — ABNORMAL LOW (ref 13.0–17.0)
MCH: 30.9 pg (ref 26.0–34.0)
MCHC: 34.4 g/dL (ref 30.0–36.0)
MCV: 89.9 fL (ref 80.0–100.0)
Platelets: 186 10*3/uL (ref 150–400)
RBC: 3.27 MIL/uL — ABNORMAL LOW (ref 4.22–5.81)
RDW: 12.5 % (ref 11.5–15.5)
WBC: 8.6 10*3/uL (ref 4.0–10.5)
nRBC: 0 % (ref 0.0–0.2)

## 2020-06-27 LAB — FERRITIN: Ferritin: 359 ng/mL — ABNORMAL HIGH (ref 24–336)

## 2020-06-27 LAB — PHOSPHORUS: Phosphorus: 3.8 mg/dL (ref 2.5–4.6)

## 2020-06-27 LAB — MAGNESIUM: Magnesium: 1.9 mg/dL (ref 1.7–2.4)

## 2020-06-27 MED ORDER — MIDODRINE HCL 5 MG PO TABS
10.0000 mg | ORAL_TABLET | Freq: Three times a day (TID) | ORAL | Status: DC
  Administered 2020-06-27 – 2020-07-02 (×15): 10 mg via ORAL
  Filled 2020-06-27 (×19): qty 2

## 2020-06-27 NOTE — Progress Notes (Signed)
Dwayne Thompson Follow Up Note  Patient ID: Dwayne Thompson, male   DOB: 08-20-1946, 74 y.o.   MRN: [id number]  HISTORY: Final pathology returned small cell carcinoma.  Patient states that he has been feeling slightly better.  He continues to have pain but his pain is under reasonably good control here in the hospital.  He states that he has not been out of bed but would like to ambulate in the halls.  He was seen by Dr. Mike Gip who made extensive recommendations in her consultation note.    Vitals:   06/27/20 0455 06/27/20 0801  BP: (!) 82/55 (!) 90/54  Pulse: (!) 109 (!) 106  Resp: 20 20  Temp: 99.7 F (37.6 C) 99 F (37.2 C)  SpO2: 94% 95%     EXAM:  Resp: Lungs are clear on the right and slightly diminished at the left base.  No respiratory distress, normal effort. Heart:  Regular without murmurs Abd:  Abdomen is soft, non distended and non tender. No masses are palpable.  There is no rebound and no guarding.  Neurological: Alert and oriented to person, place, and time. Coordination normal.  Skin: Skin is warm and dry. No rash noted. No diaphoretic. No erythema. No pallor.  Psychiatric: Normal mood and affect. Normal behavior. Judgment and thought content normal.    There is no air leak from the chest tube.  There is minimal drainage.  ASSESSMENT: Left-sided malignant pleural effusion   PLAN:   I had the opportunity today to speak with his wife and the patient regarding management.  He has a loculated left-sided pleural effusion with a chest tube in place that is not draining.  I reviewed with him the options at this point.  They would include thoracoscopy with talc pleurodesis and Pleurx catheter insertion.  This would allow [place] to try to drain the entire pleural space.  Also reviewed with him the indications and risks of Port-A-Cath insertion.  I have discussed this with the patient and his wife and reviewed the risks including risks of bleeding, infection, air leak and death.  They  understand and would like [place] to proceed.  We will perform this procedure Monday morning.  He can have his chest tube placed to waterseal and can ambulate in the halls as needed.    Dwayne Thompson, MDPatient ID: Dwayne Thompson, male   DOB: [birth year], 74 y.o.   MRN: [id number]

## 2020-06-27 NOTE — Progress Notes (Signed)
   06/27/20 0801  Assess: MEWS Score  Temp 99 F (37.2 C)  BP (!) 90/54  Pulse Rate (!) 106  Resp 20  Level of Consciousness Alert  SpO2 95 %  O2 Device Room Air  Patient Activity (if Appropriate) In bed  O2 Flow Rate (L/min) 0 L/min  Assess: MEWS Score  MEWS Temp 0  MEWS Systolic 1  MEWS Pulse 1  MEWS RR 0  MEWS LOC 0  MEWS Score 2  MEWS Score Color Yellow  Assess: if the MEWS score is Yellow or Red  Were vital signs taken at a resting state? Yes  Focused Assessment No change from prior assessment  Early Detection of Sepsis Score *See Row Information* Low  MEWS guidelines implemented *See Row Information* No, previously yellow, continue vital signs every 4 hours  Treat  MEWS Interventions Administered scheduled meds/treatments  Pain Scale 0-10  Pain Score 2  Pain Type Acute pain  Pain Location Flank  Pain Orientation Left  Pain Descriptors / Indicators Aching;Sharp  Pain Frequency Constant  Pain Onset On-going  Patients Stated Pain Goal 3  Pain Intervention(s) Repositioned  Multiple Pain Sites No  Constipation interventions Laxative;Stool Softener  Take Vital Signs  Increase Vital Sign Frequency  Yellow: Q 2hr X 2 then Q 4hr X 2, if remains yellow, continue Q 4hrs  Notify: Charge Nurse/RN  Name of Charge Nurse/RN Notified Barnabas Lister  Date Charge Nurse/RN Notified 06/27/20  Time Charge Nurse/RN Notified 0803  Notify: Provider  Provider Name/Title Williams  Date Provider Notified 06/27/20  Time Provider Notified [phone]  Notification Type Page  Pt was yellow upon coming to shift. Pt is AxOx4. Pt denies distress. MD Jimmye Norman made aware of hypotension and pt is asymptomatic. Midodrine started at this time. Will monitor closely during this shift.

## 2020-06-27 NOTE — Progress Notes (Signed)
START ON PATHWAY REGIMEN - Small Cell Lung     Cycles 1 through 4, every 21 days:     Atezolizumab      Carboplatin      Etoposide    Cycles 5 and beyond, every 21 days:     Atezolizumab   **Always confirm dose/schedule in your pharmacy ordering system**  Patient Characteristics: Newly Diagnosed, Preoperative or Nonsurgical Candidate (Clinical Staging), First Line, Extensive Stage Therapeutic Status: Newly Diagnosed, Preoperative or Nonsurgical Candidate (Clinical Staging) AJCC T Category: cT3 AJCC N Category: cN2 AJCC M Category: pM1 AJCC 8 Stage Grouping: IV Stage Classification: Extensive  Intent of Therapy: Non-Curative / Palliative Intent, Discussed with Patient

## 2020-06-27 NOTE — Evaluation (Signed)
Physical Therapy Evaluation Patient Details Name: Dwayne Thompson MRN: [id number] DOB: Apr 19, 1946 Today's Date: 06/27/2020   History of Present Illness  74 yo male with onset of worsening SOB and tachycardia with hypotension was admitted from his MD office.  On admission had hypoxia and chest pain.  Pt has Lung CA history, with pleural effusion again, chest tube installed, new chest wall mass.  PMHx:  DJD thoracic spine, mesothelioma, pleural mets, cirrhosis, GERD, HTN, back pain, anemia, COPD, anemia  Clinical Impression  Pt was seen for mobility on RW with low endurance and dropping O2 sats to 77%.  Recovered with rest to 89% so added cannula with one L O2.  Follow acutely to to monitor his sats and progress gait and transfers as tolerated.    Follow Up Recommendations Home health PT;Supervision for mobility/OOB    Equipment Recommendations  Rolling walker with 5" wheels    Recommendations for Other Services       Precautions / Restrictions Precautions Precautions: Fall Precaution Comments: ck sats with all mobility Restrictions Weight Bearing Restrictions: No      Mobility  Bed Mobility Overal bed mobility: Needs Assistance Bed Mobility: Supine to Sit;Sit to Supine     Supine to sit: Mod assist Sit to supine: Mod assist      Transfers Overall transfer level: Needs assistance Equipment used: Rolling walker (2 wheeled);1 person hand held assist Transfers: Sit to/from Stand Sit to Stand: Min assist            Ambulation/Gait Ambulation/Gait assistance: Min assist Gait Distance (Feet): 35 Feet Assistive device: Rolling walker (2 wheeled);1 person hand held assist Gait Pattern/deviations: Step-through pattern;Decreased stride length;Wide base of support Gait velocity: reduced   General Gait Details: tends to lift to move walker, encouraged to roll it  Stairs            Wheelchair Mobility    Modified Rankin (Stroke Patients Only)       Balance  Overall balance assessment: Needs assistance Sitting-balance support: Bilateral upper extremity supported;Feet supported;Feet unsupported Sitting balance-Leahy Scale: Fair Sitting balance - Comments: fair with feel keeping him secure   Standing balance support: Bilateral upper extremity supported;During functional activity Standing balance-Leahy Scale: Poor                               Pertinent Vitals/Pain Pain Assessment: No/denies pain    Home Living Family/patient expects to be discharged to:: Private residence Living Arrangements: Spouse/significant other Available Help at Discharge: Family;Available 24 hours/day Type of Home: House Home Access: Stairs to enter Entrance Stairs-Rails: Psychiatric nurse of Steps: 3 Home Layout: One level Home Equipment: Walker - 2 wheels;Cane - single point Additional Comments: Pt is up to side of bed and walked with chest tube along, has 7/10 pain on chest wall    Prior Function Level of Independence: Needs assistance   Gait / Transfers Assistance Needed: min guard to min assist with RW  ADL's / Homemaking Assistance Needed: wife assists with house and chores        Hand Dominance   Dominant Hand: Right    Extremity/Trunk Assessment   Upper Extremity Assessment Upper Extremity Assessment: Generalized weakness    Lower Extremity Assessment Lower Extremity Assessment: Generalized weakness    Cervical / Trunk Assessment Cervical / Trunk Assessment: Other exceptions;Kyphotic Cervical / Trunk Exceptions: DJD thoracic spine  Communication   Communication: HOH  Cognition Arousal/Alertness: Awake/alert Behavior During Therapy:  WFL for tasks assessed/performed Overall Cognitive Status: Within Functional Limits for tasks assessed                                        General Comments General comments (skin integrity, edema, etc.): has been given instructions to stay within walker  parameters, but after walking ck of sats was 77%.  Replaced O2 on cannula, 1L per secondi    Exercises     Assessment/Plan    PT Assessment Patient needs continued PT services  PT Problem List Decreased strength;Decreased range of motion;Decreased activity tolerance;Decreased balance;Decreased mobility;Decreased coordination;Decreased knowledge of use of DME;Decreased safety awareness;Cardiopulmonary status limiting activity;Decreased skin integrity;Pain       PT Treatment Interventions DME instruction;Gait training;Stair training;Functional mobility training;Therapeutic activities;Therapeutic exercise;Balance training;Neuromuscular re-education;Cognitive remediation;Patient/family education;Wheelchair mobility training    PT Goals (Current goals can be found in the Care Plan section)  Acute Rehab PT Goals Patient Stated Goal: to get around independently  PT Goal Formulation: With patient Time For Goal Achievement: 07/11/20 Potential to Achieve Goals: Good    Frequency Min 2X/week   Barriers to discharge Inaccessible home environment stairs to enter house    Co-evaluation               AM-PAC PT "6 Clicks" Mobility  Outcome Measure Help needed turning from your back to your side while in a flat bed without using bedrails?: None Help needed moving from lying on your back to sitting on the side of a flat bed without using bedrails?: A Little Help needed moving to and from a bed to a chair (including a wheelchair)?: A Little Help needed standing up from a chair using your arms (e.g., wheelchair or bedside chair)?: A Little Help needed to walk in hospital room?: A Little Help needed climbing 3-5 steps with a railing? : Total 6 Click Score: 17    End of Session Equipment Utilized During Treatment: Oxygen Activity Tolerance: Patient limited by fatigue;Treatment limited secondary to medical complications (Comment) Patient left: in bed;with call bell/phone within reach;with  bed alarm set Nurse Communication: Mobility status PT Visit Diagnosis: Muscle weakness (generalized) (M62.81);Other abnormalities of gait and mobility (R26.89);Pain Pain - Right/Left: Left Pain - part of body:  (chest wall)    Time: 5974-1638 PT Time Calculation (min) (ACUTE ONLY): 32 min   Charges:   PT Evaluation $PT Eval Moderate Complexity: 1 Mod PT Treatments $Gait Training: 8-22 mins       Ramond Dial 06/27/2020, 3:51 PM  Mee Hives, PT MS Acute Rehab Dept. Number: [place] and [place]

## 2020-06-27 NOTE — Treatment Plan (Signed)
Pt not understanding that he cannot get up without help since he has chest tube in. Pt educated and wife by this Probation officer and nurse tech that he cannot get up without help. Writer showed patient tube and container stating that this cannot come out w/o a doctor order. Pt not accepting education. BP is low, MD Jimmye Norman made aware via text message. BP educated on this as well. Given tylenol at this time for pain. Bed alarm on, patient dentures cleansed and placed in cup. States wife will care for the rest. Will continue to monitor.

## 2020-06-27 NOTE — Progress Notes (Signed)
   06/27/20 1138  Vitals  Temp 98.9 F (37.2 C)  Temp Source Oral  BP (!) 87/54  MAP (mmHg) (!) 64  BP Location Left Arm  BP Method Automatic  Patient Position (if appropriate) Lying  Pulse Rate (!) 101  Pulse Rate Source Dinamap  Resp 19  Level of Consciousness  Level of Consciousness Alert  MEWS COLOR  MEWS Score Color Yellow  Oxygen Therapy  SpO2 94 %  O2 Device Room Air  MEWS Score  MEWS Temp 0  MEWS Systolic 1  MEWS Pulse 1  MEWS RR 0  MEWS LOC 0  MEWS Score 2  MD Williams notified of low BP post oxycodone administration. Pt is asymptomatic at this time.

## 2020-06-27 NOTE — Progress Notes (Signed)
PROGRESS NOTE    Dwayne Thompson  FKC:[id number] DOB: 08-02-1946 DOA: 06/22/2020 PCP: Jerrol Banana., MD   Assessment & Plan:   Principal Problem:   Acute respiratory failure [facility]) Active Problems:   BPH with obstruction/lower urinary tract symptoms   Thrombocytopenia ([place])   Hypertension   Cirrhosis ([place])   Hemothorax on left   Pleural mass   Bone metastasis ([facility])   Pressure injury of skin   Hemothorax    Acute hypoxic respiratory failure: likely secondary to hemothorax. S/p chest tube placement. Fentanyl, oxycodone prn for pain. Continue w/ lidocaine patch. Resolved  Small cell lung cancer: stage IV. Chemo is only palliative as per onco. Oncology following and recs apprec. Hx of 50 pack year, quit smoking in 2015.   AKI: likely pre-renal. Resolved   Left hemothorax: secondary to small cell lung cancer. Pt denies any trauma. Will go for thoracoscopy w/ talc pleurodesis & pleurx cath insertion on 06/29/20 as per general surg. Gen surg recs apprec   Hyperkalemia: resolved   GERD: continue on pantoprazole   BPH: continue on home dose of dutasteride, tamsulosin   Cirrhosis: not on lactulose, xifaxan at home. Stable. Will need to f/u outpatient w/ GI   ACD: will transfuse if Hb <7. Will continue to monitor  Hypotension: intermittently, possibly secondary to pain meds. Started on midodrine.      DVT prophylaxis: SCDs Code Status: full  Family Communication: discussed pt's care w/ pt's wife via phone and answered her questions  Disposition Plan: depends on PT/OT recs  Status is: Inpatient  Remains inpatient appropriate because:Ongoing diagnostic testing needed not appropriate for outpatient work up, IV treatments appropriate due to intensity of illness or inability to take PO and Inpatient level of care appropriate due to severity of illness   Dispo: The patient is from: Home              Anticipated d/c is to: Home vs SNF               Anticipated d/c  date is: > 3 days              Patient currently is not medically stable to d/c.    Consultants:  Onco IR Gen surg    Procedures:   Antimicrobials:   Subjective: Pt c/o lateral chest wall pain still, unchanged from day prior.   Objective: Vitals:   06/26/20 1845 06/26/20 1948 06/26/20 2318 06/27/20 0455  BP: (!) 88/55 (!) 83/57 (!) 86/55 (!) 82/55  Pulse:  (!) 103 (!) 102 (!) 109  Resp:  20 17 20   Temp:  99 F (37.2 C) 98.2 F (36.8 C) 99.7 F (37.6 C)  TempSrc:  Oral Oral Oral  SpO2:  98% 91% 94%  Weight:      Height:        Intake/Output Summary (Last 24 hours) at 06/27/2020 0727 Last data filed at 06/27/2020 0435 [place] per 24 hour  Intake 342.71 ml  Output 680 ml  Net -337.29 ml   Filed Weights   06/22/20 1112 06/23/20 0545  Weight: 76.2 kg 86.8 kg    Examination:  General exam: Appears calm but uncomfortable  Respiratory system: decreased breath sounds b/l. No wheezes.   Cardiovascular system: S1/S2+. No rubs or gallops  Gastrointestinal system: Abdomen is nondistended, soft and nontender. Hypoactive bowel sounds  Central nervous system: Alert and oriented. Moves all 4 extremities  Psychiatry: Judgement and insight appear normal. Flat mood and affect  Data Reviewed: I have personally reviewed following labs and imaging studies  CBC: Recent Labs  Lab 06/24/20 0224 06/24/20 1223 06/25/20 0530 06/26/20 0553 06/27/20 0429  WBC 6.3 6.7 6.6 7.1 8.6  HGB 9.7* 10.2* 10.0* 9.6* 10.1*  HCT 27.9* 29.3* 27.6* 27.6* 29.4*  MCV 88.9 89.6 86.3 89.6 89.9  PLT 195 186 178 157 462   Basic Metabolic Panel: Recent Labs  Lab 06/23/20 0507 06/24/20 0224 06/25/20 0530 06/26/20 0553 06/27/20 0429  NA 135 134* 134* 134* 133*  K 5.1 4.8 4.5 5.1 4.7  CL 105 105 104 102 99  CO2 21* 23 24 24 27   GLUCOSE 84 94 94 96 102*  BUN 50* 24* 13 16 18   CREATININE 1.36* 0.90 0.77 0.93 0.88  CALCIUM 8.6* 8.1* 8.2* 8.3* 8.6*  MG  --  1.8 1.6* 1.8 1.9  PHOS  --   3.1 3.5 4.5 3.8   GFR: Estimated Creatinine Clearance: 78.4 mL/min (by C-G formula based on SCr of 0.88 mg/dL). Liver Function Tests: No results for input(s): AST, ALT, ALKPHOS, BILITOT, PROT, ALBUMIN in the last 168 hours. No results for input(s): LIPASE, AMYLASE in the last 168 hours. No results for input(s): AMMONIA in the last 168 hours. Coagulation Profile: Recent Labs  Lab 06/22/20 1404  INR 1.2   Cardiac Enzymes: No results for input(s): CKTOTAL, CKMB, CKMBINDEX, TROPONINI in the last 168 hours. BNP (last 3 results) No results for input(s): PROBNP in the last 8760 hours. HbA1C: No results for input(s): HGBA1C in the last 72 hours. CBG: Recent Labs  Lab 06/23/20 0541 06/24/20 0737  GLUCAP 77 86   Lipid Profile: No results for input(s): CHOL, HDL, LDLCALC, TRIG, CHOLHDL, LDLDIRECT in the last 72 hours. Thyroid Function Tests: No results for input(s): TSH, T4TOTAL, FREET4, T3FREE, THYROIDAB in the last 72 hours. Anemia Panel: Recent Labs    06/27/20 0429  FERRITIN 359*  TIBC 181*  IRON 25*   Sepsis Labs: No results for input(s): PROCALCITON, LATICACIDVEN in the last 168 hours.  Recent Results (from the past 240 hour(s))  SARS CORONAVIRUS 2 (TAT 6-24 HRS) Nasopharyngeal Nasopharyngeal Swab     Status: None   Collection Time: 06/20/20  3:04 PM   Specimen: Nasopharyngeal Swab  Result Value Ref Range Status   SARS Coronavirus 2 NEGATIVE NEGATIVE Final    Comment: (NOTE) SARS-CoV-2 target nucleic acids are NOT DETECTED.  The SARS-CoV-2 RNA is generally detectable in upper and lower respiratory specimens during the acute phase of infection. Negative results do not preclude SARS-CoV-2 infection, do not rule out co-infections with other pathogens, and should not be used as the sole basis for treatment or other patient management decisions. Negative results must be combined with clinical observations, patient history, and epidemiological information. The  expected result is Negative.  Fact Sheet for Patients: SugarRoll.be  Fact Sheet for Healthcare Providers: https://www.woods-mathews.com/  This test is not yet approved or cleared by the [place] FDA and  has been authorized for detection and/or diagnosis of SARS-CoV-2 by FDA under an Emergency Use Authorization (EUA). This EUA will remain  in effect (meaning this test can be used) for the duration of the COVID-19 declaration under Se ction 564(b)(1) of the Act, 21 U.S.C. section 360bbb-3(b)(1), unless the authorization is terminated or revoked sooner.  Performed at [facility], [place] [address] [place], [place] [address]   MRSA PCR Screening     Status: None   Collection Time: 06/23/20  5:46 AM   Specimen:  Nasopharyngeal  Result Value Ref Range Status   MRSA by PCR NEGATIVE NEGATIVE Final    Comment:        The GeneXpert MRSA Assay (FDA approved for NASAL specimens only), is one component of a comprehensive MRSA colonization surveillance program. It is not intended to diagnose MRSA infection nor to guide or monitor treatment for MRSA infections. Performed at [facility], [address] [place], [place] [address]          Radiology Studies: No results found.      Scheduled Meds: . sodium chloride   Intravenous Once  . cholecalciferol  1,000 Units Oral Daily  . docusate sodium  100 mg Oral Daily  . dutasteride  0.5 mg Oral Daily  . feeding supplement (ENSURE ENLIVE)  237 mL Oral BID BM  . folic acid  1 mg Oral Daily  . lidocaine  1 patch Transdermal Q24H  . loratadine  10 mg Oral Daily  . montelukast  10 mg Oral Daily  . pantoprazole  20 mg Oral BID  . polyethylene glycol  17 g Oral Daily  . rosuvastatin  10 mg Oral Daily  . tamsulosin  0.4 mg Oral Daily  . cyanocobalamin  1,000 mcg Oral Daily   Continuous Infusions: . sodium chloride 10 mL/hr at 06/27/20 0200     LOS: 5 days    Time  spent: 30 mins     Wyvonnia Dusky, MD [facility] Pager [phone]  If 7PM-7AM, please contact night-coverage [email or web address] 06/27/2020, 7:27 AM

## 2020-06-27 NOTE — H&P (View-Only) (Signed)
Dwayne Thompson Follow Up Note  Patient ID: Dwayne Thompson, male   DOB: [birth year], 74 y.o.   MRN: [id number]  HISTORY: Final pathology returned small cell carcinoma.  Patient states that he has been feeling slightly better.  He continues to have pain but his pain is under reasonably good control here in the hospital.  He states that he has not been out of bed but would like to ambulate in the halls.  He was seen by Dr. Mike Gip who made extensive recommendations in her consultation note.    Vitals:   06/27/20 0455 06/27/20 0801  BP: (!) 82/55 (!) 90/54  Pulse: (!) 109 (!) 106  Resp: 20 20  Temp: 99.7 F (37.6 C) 99 F (37.2 C)  SpO2: 94% 95%     EXAM:  Resp: Lungs are clear on the right and slightly diminished at the left base.  No respiratory distress, normal effort. Heart:  Regular without murmurs Abd:  Abdomen is soft, non distended and non tender. No masses are palpable.  There is no rebound and no guarding.  Neurological: Alert and oriented to person, place, and time. Coordination normal.  Skin: Skin is warm and dry. No rash noted. No diaphoretic. No erythema. No pallor.  Psychiatric: Normal mood and affect. Normal behavior. Judgment and thought content normal.    There is no air leak from the chest tube.  There is minimal drainage.  ASSESSMENT: Left-sided malignant pleural effusion   PLAN:   I had the opportunity today to speak with his wife and the patient regarding management.  He has a loculated left-sided pleural effusion with a chest tube in place that is not draining.  I reviewed with him the options at this point.  They would include thoracoscopy with talc pleurodesis and Pleurx catheter insertion.  This would allow [place] to try to drain the entire pleural space.  Also reviewed with him the indications and risks of Port-A-Cath insertion.  I have discussed this with the patient and his wife and reviewed the risks including risks of bleeding, infection, air leak and death.  They  understand and would like [place] to proceed.  We will perform this procedure Monday morning.  He can have his chest tube placed to waterseal and can ambulate in the halls as needed.    Dwayne Thompson, MDPatient ID: Dwayne Thompson, male   DOB: 1946-02-21, 74 y.o.   MRN: [id number]

## 2020-06-28 ENCOUNTER — Inpatient Hospital Stay: Payer: Medicare Other

## 2020-06-28 ENCOUNTER — Encounter: Payer: Self-pay | Admitting: Internal Medicine

## 2020-06-28 DIAGNOSIS — J942 Hemothorax: Secondary | ICD-10-CM | POA: Diagnosis not present

## 2020-06-28 DIAGNOSIS — J9601 Acute respiratory failure with hypoxia: Secondary | ICD-10-CM | POA: Diagnosis not present

## 2020-06-28 DIAGNOSIS — C3490 Malignant neoplasm of unspecified part of unspecified bronchus or lung: Secondary | ICD-10-CM | POA: Diagnosis not present

## 2020-06-28 DIAGNOSIS — C801 Malignant (primary) neoplasm, unspecified: Secondary | ICD-10-CM | POA: Diagnosis not present

## 2020-06-28 LAB — CBC WITH DIFFERENTIAL/PLATELET
Abs Immature Granulocytes: 0.05 10*3/uL (ref 0.00–0.07)
Basophils Absolute: 0 10*3/uL (ref 0.0–0.1)
Basophils Relative: 0 %
Eosinophils Absolute: 0.1 10*3/uL (ref 0.0–0.5)
Eosinophils Relative: 1 %
HCT: 27.9 % — ABNORMAL LOW (ref 39.0–52.0)
Hemoglobin: 9.8 g/dL — ABNORMAL LOW (ref 13.0–17.0)
Immature Granulocytes: 1 %
Lymphocytes Relative: 12 %
Lymphs Abs: 1.1 10*3/uL (ref 0.7–4.0)
MCH: 30.8 pg (ref 26.0–34.0)
MCHC: 35.1 g/dL (ref 30.0–36.0)
MCV: 87.7 fL (ref 80.0–100.0)
Monocytes Absolute: 1 10*3/uL (ref 0.1–1.0)
Monocytes Relative: 11 %
Neutro Abs: 6.9 10*3/uL (ref 1.7–7.7)
Neutrophils Relative %: 75 %
Platelets: 165 10*3/uL (ref 150–400)
RBC: 3.18 MIL/uL — ABNORMAL LOW (ref 4.22–5.81)
RDW: 12.5 % (ref 11.5–15.5)
WBC: 9.2 10*3/uL (ref 4.0–10.5)
nRBC: 0 % (ref 0.0–0.2)

## 2020-06-28 LAB — COMPREHENSIVE METABOLIC PANEL
ALT: 34 U/L (ref 0–44)
AST: 46 U/L — ABNORMAL HIGH (ref 15–41)
Albumin: 2.5 g/dL — ABNORMAL LOW (ref 3.5–5.0)
Alkaline Phosphatase: 52 U/L (ref 38–126)
Anion gap: 8 (ref 5–15)
BUN: 17 mg/dL (ref 8–23)
CO2: 25 mmol/L (ref 22–32)
Calcium: 8.3 mg/dL — ABNORMAL LOW (ref 8.9–10.3)
Chloride: 97 mmol/L — ABNORMAL LOW (ref 98–111)
Creatinine, Ser: 0.77 mg/dL (ref 0.61–1.24)
GFR, Estimated: >60 mL/min (ref >60)
Glucose, Bld: 133 mg/dL — ABNORMAL HIGH (ref 70–99)
Potassium: 4.3 mmol/L (ref 3.5–5.1)
Sodium: 130 mmol/L — ABNORMAL LOW (ref 135–145)
Total Bilirubin: 0.4 mg/dL (ref 0.3–1.2)
Total Protein: 5.6 g/dL — ABNORMAL LOW (ref 6.5–8.1)

## 2020-06-28 LAB — BASIC METABOLIC PANEL
Anion gap: 9 (ref 5–15)
BUN: 14 mg/dL (ref 8–23)
CO2: 25 mmol/L (ref 22–32)
Calcium: 8.5 mg/dL — ABNORMAL LOW (ref 8.9–10.3)
Chloride: 98 mmol/L (ref 98–111)
Creatinine, Ser: 0.87 mg/dL (ref 0.61–1.24)
GFR, Estimated: >60 mL/min (ref >60)
Glucose, Bld: 92 mg/dL (ref 70–99)
Potassium: 4.2 mmol/L (ref 3.5–5.1)
Sodium: 132 mmol/L — ABNORMAL LOW (ref 135–145)

## 2020-06-28 LAB — CEA: CEA: 2.7 ng/mL (ref 0.0–4.7)

## 2020-06-28 LAB — CBC
HCT: 28.8 % — ABNORMAL LOW (ref 39.0–52.0)
Hemoglobin: 9.8 g/dL — ABNORMAL LOW (ref 13.0–17.0)
MCH: 30.3 pg (ref 26.0–34.0)
MCHC: 34 g/dL (ref 30.0–36.0)
MCV: 89.2 fL (ref 80.0–100.0)
Platelets: 167 10*3/uL (ref 150–400)
RBC: 3.23 MIL/uL — ABNORMAL LOW (ref 4.22–5.81)
RDW: 12.4 % (ref 11.5–15.5)
WBC: 8.8 10*3/uL (ref 4.0–10.5)
nRBC: 0 % (ref 0.0–0.2)

## 2020-06-28 LAB — PROTIME-INR
INR: 1 (ref 0.8–1.2)
Prothrombin Time: 12.8 seconds (ref 11.4–15.2)

## 2020-06-28 LAB — PHOSPHORUS: Phosphorus: 3.5 mg/dL (ref 2.5–4.6)

## 2020-06-28 LAB — MAGNESIUM: Magnesium: 1.7 mg/dL (ref 1.7–2.4)

## 2020-06-28 LAB — APTT: aPTT: 37 seconds — ABNORMAL HIGH (ref 24–36)

## 2020-06-28 MED ORDER — CHLORHEXIDINE GLUCONATE CLOTH 2 % EX PADS
6.0000 | MEDICATED_PAD | Freq: Once | CUTANEOUS | Status: AC
  Administered 2020-06-29: 6 via TOPICAL

## 2020-06-28 MED ORDER — CEFAZOLIN SODIUM-DEXTROSE 2-4 GM/100ML-% IV SOLN
2.0000 g | INTRAVENOUS | Status: AC
  Administered 2020-06-29: 2 g via INTRAVENOUS
  Filled 2020-06-28 (×2): qty 100

## 2020-06-28 MED ORDER — GADOBUTROL 1 MMOL/ML IV SOLN
7.0000 mL | Freq: Once | INTRAVENOUS | Status: AC | PRN
  Administered 2020-06-28: 7.5 mL via INTRAVENOUS

## 2020-06-28 NOTE — Progress Notes (Signed)
PROGRESS NOTE    Dwayne Thompson  LGX:[id number] DOB: 02-19-1946 DOA: 06/22/2020 PCP: Jerrol Banana., MD   Assessment & Plan:   Principal Problem:   Acute respiratory failure [facility]) Active Problems:   BPH with obstruction/lower urinary tract symptoms   Thrombocytopenia ([place])   Hypertension   Cirrhosis ([place])   Hemothorax on left   Pleural mass   Bone metastasis ([facility])   Pressure injury of skin   Hemothorax   Chest tube in place   Small cell lung cancer: stage IV. Chemo is only palliative as per onco. Oncology following and recs apprec. Hx of 50 pack year, quit smoking in 2015.   Left hemothorax: secondary to small cell lung cancer. S/p chest tube placement. Pt denies any trauma. Will go for thoracoscopy w/ talc pleurodesis & pleurx cath insertion on 06/29/20 as per general surg. Fentanyl, oxycodone prn for pain.  Gen surg recs apprec   Acute hypoxic respiratory failure: likely secondary to hemothorax. S/p chest tube placement. Resolved  Hyperkalemia: resolved   AKI: likely pre-renal. Resolved   GERD: continue on PPI   BPH: continue on home dose of avodart. Hold home dose of tamsulosin secondary to hypotension  Cirrhosis: not on lactulose, xifaxan at home. Stable. Will need to f/u outpatient w/ GI   ACD: no need for a transfusion at this time. Will continue to monitor   Hypotension: intermittently, possibly secondary to pain meds. Continue on midodrine     DVT prophylaxis: SCDs Code Status: full  Family Communication: discussed pt's care w/ pt's wife via phone and answered her questions  Disposition Plan: likely d/c home w/ home health   Status is: Inpatient  Remains inpatient appropriate because:Ongoing diagnostic testing needed not appropriate for outpatient work up, IV treatments appropriate due to intensity of illness or inability to take PO and Inpatient level of care appropriate due to severity of illness   Dispo: The patient is from: Home               Anticipated d/c is to: Home w/ home health               Anticipated d/c date is: > 3 days              Patient currently is not medically stable to d/c.    Consultants:  Onco IR Gen surg    Procedures:   Antimicrobials:   Subjective: Pt c/o malaise and lateral chest wall pain   Objective: Vitals:   06/27/20 1737 06/27/20 2004 06/27/20 2336 06/28/20 0301  BP: (!) 93/55 (!) 86/53 (!) 87/56 103/62  Pulse:  91 92 99  Resp:  18 15 17   Temp:  98.7 F (37.1 C) 98.2 F (36.8 C) 98.2 F (36.8 C)  TempSrc:  Oral Oral Oral  SpO2:  94% 93% 92%  Weight:      Height:        Intake/Output Summary (Last 24 hours) at 06/28/2020 0721 Last data filed at 06/28/2020 0600 [place] per 24 hour  Intake 1080 ml  Output 555 ml  Net 525 ml   Filed Weights   06/22/20 1112 06/23/20 0545  Weight: 76.2 kg 86.8 kg    Examination:  General exam: Appears calm but uncomfortable  Respiratory system: diminished breath sounds b/l.  Cardiovascular system: S1/S2+. No rubs or gallops. B/l LE +1 pitting edema   Gastrointestinal system: Abd is soft, non-tender, non-distended & hypoactive bowel sounds.  Central nervous system: Alert and oriented. Moves  all 4 extremities  Psychiatry: Judgement and insight appear normal. Flat mood and affect     Data Reviewed: I have personally reviewed following labs and imaging studies  CBC: Recent Labs  Lab 06/24/20 1223 06/25/20 0530 06/26/20 0553 06/27/20 0429 06/28/20 0507  WBC 6.7 6.6 7.1 8.6 8.8  HGB 10.2* 10.0* 9.6* 10.1* 9.8*  HCT 29.3* 27.6* 27.6* 29.4* 28.8*  MCV 89.6 86.3 89.6 89.9 89.2  PLT 186 178 157 186 440   Basic Metabolic Panel: Recent Labs  Lab 06/24/20 0224 06/25/20 0530 06/26/20 0553 06/27/20 0429 06/28/20 0507  NA 134* 134* 134* 133* 132*  K 4.8 4.5 5.1 4.7 4.2  CL 105 104 102 99 98  CO2 23 24 24 27 25   GLUCOSE 94 94 96 102* 92  BUN 24* 13 16 18 14   CREATININE 0.90 0.77 0.93 0.88 0.87  CALCIUM 8.1* 8.2* 8.3* 8.6* 8.5*    MG 1.8 1.6* 1.8 1.9 1.7  PHOS 3.1 3.5 4.5 3.8 3.5   GFR: Estimated Creatinine Clearance: 79.3 mL/min (by C-G formula based on SCr of 0.87 mg/dL). Liver Function Tests: No results for input(s): AST, ALT, ALKPHOS, BILITOT, PROT, ALBUMIN in the last 168 hours. No results for input(s): LIPASE, AMYLASE in the last 168 hours. No results for input(s): AMMONIA in the last 168 hours. Coagulation Profile: Recent Labs  Lab 06/22/20 1404  INR 1.2   Cardiac Enzymes: No results for input(s): CKTOTAL, CKMB, CKMBINDEX, TROPONINI in the last 168 hours. BNP (last 3 results) No results for input(s): PROBNP in the last 8760 hours. HbA1C: No results for input(s): HGBA1C in the last 72 hours. CBG: Recent Labs  Lab 06/23/20 0541 06/24/20 0737  GLUCAP 77 86   Lipid Profile: No results for input(s): CHOL, HDL, LDLCALC, TRIG, CHOLHDL, LDLDIRECT in the last 72 hours. Thyroid Function Tests: No results for input(s): TSH, T4TOTAL, FREET4, T3FREE, THYROIDAB in the last 72 hours. Anemia Panel: Recent Labs    06/27/20 0429  FERRITIN 359*  TIBC 181*  IRON 25*   Sepsis Labs: No results for input(s): PROCALCITON, LATICACIDVEN in the last 168 hours.  Recent Results (from the past 240 hour(s))  SARS CORONAVIRUS 2 (TAT 6-24 HRS) Nasopharyngeal Nasopharyngeal Swab     Status: None   Collection Time: 06/20/20  3:04 PM   Specimen: Nasopharyngeal Swab  Result Value Ref Range Status   SARS Coronavirus 2 NEGATIVE NEGATIVE Final    Comment: (NOTE) SARS-CoV-2 target nucleic acids are NOT DETECTED.  The SARS-CoV-2 RNA is generally detectable in upper and lower respiratory specimens during the acute phase of infection. Negative results do not preclude SARS-CoV-2 infection, do not rule out co-infections with other pathogens, and should not be used as the sole basis for treatment or other patient management decisions. Negative results must be combined with clinical observations, patient history, and  epidemiological information. The expected result is Negative.  Fact Sheet for Patients: SugarRoll.be  Fact Sheet for Healthcare Providers: https://www.woods-mathews.com/  This test is not yet approved or cleared by the [place] FDA and  has been authorized for detection and/or diagnosis of SARS-CoV-2 by FDA under an Emergency Use Authorization (EUA). This EUA will remain  in effect (meaning this test can be used) for the duration of the COVID-19 declaration under Se ction 564(b)(1) of the Act, 21 U.S.C. section 360bbb-3(b)(1), unless the authorization is terminated or revoked sooner.  Performed at [facility], [place] [address] [place], [place] [address]   MRSA PCR Screening  Status: None   Collection Time: 06/23/20  5:46 AM   Specimen: Nasopharyngeal  Result Value Ref Range Status   MRSA by PCR NEGATIVE NEGATIVE Final    Comment:        The GeneXpert MRSA Assay (FDA approved for NASAL specimens only), is one component of a comprehensive MRSA colonization surveillance program. It is not intended to diagnose MRSA infection nor to guide or monitor treatment for MRSA infections. Performed at [facility], [address], [place], [place] [address]          Radiology Studies: No results found.      Scheduled Meds: . sodium chloride   Intravenous Once  . cholecalciferol  1,000 Units Oral Daily  . docusate sodium  100 mg Oral Daily  . dutasteride  0.5 mg Oral Daily  . feeding supplement (ENSURE ENLIVE)  237 mL Oral BID BM  . folic acid  1 mg Oral Daily  . lidocaine  1 patch Transdermal Q24H  . loratadine  10 mg Oral Daily  . midodrine  10 mg Oral TID WC  . montelukast  10 mg Oral Daily  . pantoprazole  20 mg Oral BID  . polyethylene glycol  17 g Oral Daily  . rosuvastatin  10 mg Oral Daily  . cyanocobalamin  1,000 mcg Oral Daily   Continuous Infusions: . sodium chloride Stopped (06/27/20 1115)       LOS: 6 days    Time spent: 32 mins     Wyvonnia Dusky, MD [facility] Pager [phone]  If 7PM-7AM, please contact night-coverage [email or web address] 06/28/2020, 7:21 AM

## 2020-06-28 NOTE — Progress Notes (Signed)
MRI scans of brain and t-spine unsuccessfully attempted at this time due to patient pain level/inability to hold still with pain for the length of 17min-1 hour long to complete scan. Will try again when pain has been managed better to tolerate scan conditions.

## 2020-06-28 NOTE — Progress Notes (Signed)
[facility] Hematology/Oncology Progress Note  Date of admission: 06/22/2020  Hospital day:  06/28/2020  Chief Complaint: Dwayne Thompson is a 74 y.o. male with recurrent left sided pleural effusion who was admitted through the emergency room with acute respiratory failure secondary to a hemothorax.  Subjective: He notes significant left chest discomfort when he attempted to undergo MRI today. Chest tube remains in place.  Social History: The patient is accompanied by his wife today.  Allergies:  Allergies  Allergen Reactions  . Indomethacin Swelling    ankle swelling  . Tamsulosin Hcl Hives  . Sulfa Antibiotics Rash    Scheduled Medications: . sodium chloride   Intravenous Once  . cholecalciferol  1,000 Units Oral Daily  . docusate sodium  100 mg Oral Daily  . dutasteride  0.5 mg Oral Daily  . feeding supplement (ENSURE ENLIVE)  237 mL Oral BID BM  . folic acid  1 mg Oral Daily  . lidocaine  1 patch Transdermal Q24H  . loratadine  10 mg Oral Daily  . midodrine  10 mg Oral TID WC  . montelukast  10 mg Oral Daily  . pantoprazole  20 mg Oral BID  . polyethylene glycol  17 g Oral Daily  . rosuvastatin  10 mg Oral Daily  . cyanocobalamin  1,000 mcg Oral Daily    Review of Systems  Constitutional: Positive for malaise/fatigue and weight loss (10 pounds prior to admission). Negative for chills, diaphoresis and fever.  HENT: Negative.  Negative for congestion, ear pain, nosebleeds, sinus pain and sore throat.   Eyes: Negative.  Negative for blurred vision, double vision and photophobia.  Respiratory: Positive for shortness of breath. Negative for hemoptysis and wheezing.        Left sided chest wall pain.  Cardiovascular: Negative.  Negative for chest pain and palpitations.  Gastrointestinal: Positive for constipation. Negative for abdominal pain, blood in stool, diarrhea, heartburn, melena, nausea and vomiting.  Genitourinary: Negative.  Negative for dysuria,  frequency, hematuria and urgency.  Musculoskeletal: Negative for back pain, joint pain, myalgias and neck pain.  Skin: Negative.  Negative for itching and rash.  Neurological: Negative for dizziness, tingling, tremors, sensory change, speech change, focal weakness and headaches.       General weakness.  Endo/Heme/Allergies: Negative.  Does not bruise/bleed easily.  Psychiatric/Behavioral: Negative.  Negative for depression and memory loss. The patient is not nervous/anxious and does not have insomnia.     Performance status: 2  Blood pressure 94/60, pulse 98, temperature 98.9 F (37.2 C), resp. rate 20, height 5\' 11"  (1.803 m), weight 191 lb 5.8 oz (86.8 kg), SpO2 93 %.   Physical Exam Constitutional:      General: He is not in acute distress.    Appearance: He is not ill-appearing or diaphoretic.  HENT:     Head: Normocephalic and atraumatic.     Comments: Short graying hair.    Mouth/Throat:     Mouth: Mucous membranes are moist.     Pharynx: Oropharynx is clear. No oropharyngeal exudate.  Eyes:     Extraocular Movements: Extraocular movements intact.     Pupils: Pupils are equal, round, and reactive to light.     Comments: Glasses.  Brown eyes.  Cardiovascular:     Rate and Rhythm: Normal rate and regular rhythm.     Heart sounds: No murmur heard.   Pulmonary:     Breath sounds: Examination of the left-lower field reveals decreased breath sounds. Decreased breath  sounds present. No wheezing, rhonchi or rales.  Chest:     Chest wall: No mass or tenderness.  Abdominal:     General: Bowel sounds are normal.     Palpations: Abdomen is soft. There is no hepatomegaly, splenomegaly or mass.     Tenderness: There is no abdominal tenderness. There is no guarding or rebound.  Musculoskeletal:     Cervical back: Normal range of motion.     Comments: Mild bilateral lower extremity edema.  Skin:    General: Skin is warm and dry.     Coloration: Skin is not cyanotic or pale.      Findings: No ecchymosis or erythema.  Neurological:     General: No focal deficit present.     Mental Status: He is alert and oriented to person, place, and time.  Psychiatric:        Mood and Affect: Mood normal. Mood is not anxious.        Behavior: Behavior normal. Behavior is not agitated.    Results for orders placed or performed during the hospital encounter of 06/22/20 (from the past 48 hour(s))  Basic metabolic panel     Status: Abnormal   Collection Time: 06/27/20  4:29 AM  Result Value Ref Range   Sodium 133 (L) 135 - 145 mmol/L   Potassium 4.7 3.5 - 5.1 mmol/L   Chloride 99 98 - 111 mmol/L   CO2 27 22 - 32 mmol/L   Glucose, Bld 102 (H) 70 - 99 mg/dL    Comment: Glucose reference range applies only to samples taken after fasting for at least 8 hours.   BUN 18 8 - 23 mg/dL   Creatinine, Ser 0.88 0.61 - 1.24 mg/dL   Calcium 8.6 (L) 8.9 - 10.3 mg/dL   GFR, Estimated >60 >60 mL/min   Anion gap 7 5 - 15    Comment: Performed at [facility], [address] [place], [place] [address]  Magnesium     Status: None   Collection Time: 06/27/20  4:29 AM  Result Value Ref Range   Magnesium 1.9 1.7 - 2.4 mg/dL    Comment: Performed at [facility], [address] [place], [place] [address]  Phosphorus     Status: None   Collection Time: 06/27/20  4:29 AM  Result Value Ref Range   Phosphorus 3.8 2.5 - 4.6 mg/dL    Comment: Performed at [facility], [address] [place], [place] [address]  CBC     Status: Abnormal   Collection Time: 06/27/20  4:29 AM  Result Value Ref Range   WBC 8.6 4.0 - 10.5 K/uL   RBC 3.27 (L) 4.22 - 5.81 MIL/uL   Hemoglobin 10.1 (L) 13.0 - 17.0 g/dL   HCT 29.4 (L) 39 - 52 %   MCV 89.9 80.0 - 100.0 fL   MCH 30.9 26.0 - 34.0 pg   MCHC 34.4 30.0 - 36.0 g/dL   RDW 12.5 11.5 - 15.5 %   Platelets 186 150 - 400 K/uL   nRBC 0.0 0.0 - 0.2 %    Comment: Performed at [facility], [address] [place], [place]  [address]  CEA     Status: None   Collection Time: 06/27/20  4:29 AM  Result Value Ref Range   CEA 2.7 0.0 - 4.7 ng/mL    Comment: (NOTE)  Nonsmokers          <3.9                             Smokers             <5.6 [employer] Electrochemiluminescence Immunoassay (ECLIA) Values obtained with different assay methods or kits cannot be used interchangeably.  Results cannot be interpreted as absolute evidence of the presence or absence of malignant disease. Performed At: [facility] [place], [place] [id number] Rush Farmer MD CN:[id number]   Ferritin     Status: Abnormal   Collection Time: 06/27/20  4:29 AM  Result Value Ref Range   Ferritin 359 (H) 24 - 336 ng/mL    Comment: Performed at [facility], [address] [place], [place] [address]  Iron and TIBC     Status: Abnormal   Collection Time: 06/27/20  4:29 AM  Result Value Ref Range   Iron 25 (L) 45 - 182 ug/dL   TIBC 181 (L) 250 - 450 ug/dL   Saturation Ratios 14 (L) 17.9 - 39.5 %   UIBC 156 ug/dL    Comment: Performed at [facility], [address], [place], [place] [address]  Basic metabolic panel     Status: Abnormal   Collection Time: 06/28/20  5:07 AM  Result Value Ref Range   Sodium 132 (L) 135 - 145 mmol/L   Potassium 4.2 3.5 - 5.1 mmol/L   Chloride 98 98 - 111 mmol/L   CO2 25 22 - 32 mmol/L   Glucose, Bld 92 70 - 99 mg/dL    Comment: Glucose reference range applies only to samples taken after fasting for at least 8 hours.   BUN 14 8 - 23 mg/dL   Creatinine, Ser 0.87 0.61 - 1.24 mg/dL   Calcium 8.5 (L) 8.9 - 10.3 mg/dL   GFR, Estimated >60 >60 mL/min   Anion gap 9 5 - 15    Comment: Performed at [facility], [address] [place], [place] [address]  Magnesium     Status: None   Collection Time: 06/28/20  5:07 AM  Result Value Ref Range   Magnesium 1.7 1.7 - 2.4 mg/dL    Comment: Performed at [facility],  [address] [place], [place] [address]  Phosphorus     Status: None   Collection Time: 06/28/20  5:07 AM  Result Value Ref Range   Phosphorus 3.5 2.5 - 4.6 mg/dL    Comment: Performed at [facility], [address] [place], [place] [address]  CBC     Status: Abnormal   Collection Time: 06/28/20  5:07 AM  Result Value Ref Range   WBC 8.8 4.0 - 10.5 K/uL   RBC 3.23 (L) 4.22 - 5.81 MIL/uL   Hemoglobin 9.8 (L) 13.0 - 17.0 g/dL   HCT 28.8 (L) 39 - 52 %   MCV 89.2 80.0 - 100.0 fL   MCH 30.3 26.0 - 34.0 pg   MCHC 34.0 30.0 - 36.0 g/dL   RDW 12.4 11.5 - 15.5 %   Platelets 167 150 - 400 K/uL   nRBC 0.0 0.0 - 0.2 %    Comment: Performed at [facility], [address] [place], [place] [address]   No results found.  Assessment:  MIA WINTHROP is a 74 y.o. male with extensive stage small cell lung cancer s/p left chest wall/pleural biopsy  on 06/24/2020.  He presented with left hemothorax. Thoracentesis x2 (05/20/2020 and 06/05/2020) revealed bloody fluid with no evidence of malignancy.  He has a chest tube in place.  Chest CT on 06/05/2020 revealed pleural thickening, nodularity and pleural-based mass with central adenopathy versus mass suspicious for pulmonary neoplasm with extensive pleural metastasis.  Mesothelioma is in the differential.  There was increasing left subcarinal lymph node use into the left mainstem bronchus.  There was increasing upper abdominal lymph nodes.  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  There was accentuated activity along the consolidative and atelectatic left lower lobe probably from tumor within the lung but potentially from tumor along the visceral pleura.  There was hypermetabolic thoracic adenopathy along with hypermetabolic upper abdominal adenopathy and hypermetabolic skeletal metastatic lesions (right iliac bone SUV 11.9 and T1). There  was possible early tumor extension into the left T11-12 neuroforamen with some erosion of the adjacent T11 vertebral body.  CXR on 06/22/2020 revealed complete of opacification of the left lung secondary to a large pleural effusion.  There was right sided shift.  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 He drinks alcohol daily.  Symptomatically, he notes stable left sided chest wall pain.  Pain increased in intensity during attempted MRI today.  Plan:   1.   Extensive stage small cell lung cancer             He has a 50 pack year smoking history.             PET scan on 06/12/2020 revealed pleural based disease, hypermetabolic adenopathy, and skeletal metastasis.             Pleural biopsy on 06/24/2020 confirmed small cell lung cancer.  Head MRI  and thoracic spine MRI were unable to be performed today secondary to pain.   He was unable to lie flat.  Will try to repeat prior to discharge.  Review plan for carboplatin, etopside, and Tecentriq every 3 weeks in the outpatient department.   Information sheets on chemotherapy provided today.  Discuss plan for outpatient chemotherapy this week (3 days).  Patient scheduled for port-a-cath tomorrow.  Will arrange chemotherapy teaching as an inpatient.  Several questions asked and answered. 2.   Recurrent pleural effusion  Chest tube remains in place.  Patient scheduled for thoracoscopy with talc pleurodesis tomorrow. 3.   Normocytic anemia             Hematocrit 28.8.  Hemoglobin 9.8.  MCV 89.2.   Ferritin 112 with an iron saturation of 13% (low) and TIBC 328 on 05/20/2020.   Ferritin 359 with an iron saturation of 14% (low) and TIBC 181 on 06/27/2020.  Etiology secondary to hemothorax.           4.   Renal insufficiency             Creatinine 0.87 today.  Creatinine 2.75 on admission.             Baseline creatinine 0.85 - 1.12. 5.  Cancer-related pain             Symptomatically, pain has  improved since admission.  Pain exacerbated with MRI attempt today.  Continue oxycodone 10 mg po q 4 hours prn pain. 6.   Disposition  Discuss plans for possible discharge early this week to allow 3 days of chemotherapy in  the outpatient department.    Lequita Asal, MD  06/28/2020, 8:30 PM

## 2020-06-29 ENCOUNTER — Encounter: Payer: Self-pay | Admitting: Internal Medicine

## 2020-06-29 ENCOUNTER — Inpatient Hospital Stay: Payer: Medicare Other

## 2020-06-29 ENCOUNTER — Inpatient Hospital Stay: Payer: Medicare Other | Admitting: Certified Registered Nurse Anesthetist

## 2020-06-29 ENCOUNTER — Encounter
Admission: EM | Disposition: A | Discharge status: Home / Self Care | Payer: Self-pay | Source: Ambulatory Visit | Attending: Internal Medicine

## 2020-06-29 ENCOUNTER — Ambulatory Visit: Payer: Medicare Other | Admitting: Pulmonary Disease

## 2020-06-29 ENCOUNTER — Other Ambulatory Visit: Payer: Self-pay | Admitting: Hematology and Oncology

## 2020-06-29 DIAGNOSIS — J942 Hemothorax: Secondary | ICD-10-CM | POA: Diagnosis not present

## 2020-06-29 DIAGNOSIS — K7460 Unspecified cirrhosis of liver: Secondary | ICD-10-CM | POA: Diagnosis not present

## 2020-06-29 DIAGNOSIS — J910 Malignant pleural effusion: Secondary | ICD-10-CM

## 2020-06-29 DIAGNOSIS — C801 Malignant (primary) neoplasm, unspecified: Secondary | ICD-10-CM | POA: Diagnosis not present

## 2020-06-29 DIAGNOSIS — C3432 Malignant neoplasm of lower lobe, left bronchus or lung: Secondary | ICD-10-CM

## 2020-06-29 HISTORY — PX: VIDEO ASSISTED THORACOSCOPY (VATS)/THOROCOTOMY: SHX6173

## 2020-06-29 HISTORY — PX: PORTACATH PLACEMENT: SHX2246

## 2020-06-29 LAB — CBC
HCT: 27.5 % — ABNORMAL LOW (ref 39.0–52.0)
Hemoglobin: 9.7 g/dL — ABNORMAL LOW (ref 13.0–17.0)
MCH: 30.7 pg (ref 26.0–34.0)
MCHC: 35.3 g/dL (ref 30.0–36.0)
MCV: 87 fL (ref 80.0–100.0)
Platelets: 170 10*3/uL (ref 150–400)
RBC: 3.16 MIL/uL — ABNORMAL LOW (ref 4.22–5.81)
RDW: 12.3 % (ref 11.5–15.5)
WBC: 8.3 10*3/uL (ref 4.0–10.5)
nRBC: 0 % (ref 0.0–0.2)

## 2020-06-29 LAB — MAGNESIUM: Magnesium: 1.7 mg/dL (ref 1.7–2.4)

## 2020-06-29 LAB — PHOSPHORUS: Phosphorus: 3.4 mg/dL (ref 2.5–4.6)

## 2020-06-29 SURGERY — VIDEO ASSISTED THORACOSCOPY (VATS)/THOROCOTOMY
Anesthesia: General | Site: Chest | Laterality: Left

## 2020-06-29 MED ORDER — EPHEDRINE SULFATE 50 MG/ML IJ SOLN
INTRAMUSCULAR | Status: DC | PRN
  Administered 2020-06-29: 5 mg via INTRAVENOUS
  Administered 2020-06-29 (×2): 10 mg via INTRAVENOUS
  Administered 2020-06-29: 5 mg via INTRAVENOUS
  Administered 2020-06-29: 10 mg via INTRAVENOUS

## 2020-06-29 MED ORDER — LIDOCAINE HCL 1 % IJ SOLN
INTRAMUSCULAR | Status: DC | PRN
  Administered 2020-06-29: 10 mL

## 2020-06-29 MED ORDER — SODIUM CHLORIDE 0.9 % IV SOLN
INTRAVENOUS | Status: DC | PRN
  Administered 2020-06-29: 10 mL via INTRAMUSCULAR

## 2020-06-29 MED ORDER — SUGAMMADEX SODIUM 200 MG/2ML IV SOLN
INTRAVENOUS | Status: DC | PRN
  Administered 2020-06-29: 200 mg via INTRAVENOUS

## 2020-06-29 MED ORDER — MIDAZOLAM HCL 2 MG/2ML IJ SOLN
INTRAMUSCULAR | Status: DC | PRN
  Administered 2020-06-29: 1 mg via INTRAVENOUS

## 2020-06-29 MED ORDER — ONDANSETRON HCL 4 MG/2ML IJ SOLN
INTRAMUSCULAR | Status: DC | PRN
  Administered 2020-06-29: 4 mg via INTRAVENOUS

## 2020-06-29 MED ORDER — BUPIVACAINE LIPOSOME 1.3 % IJ SUSP
INTRAMUSCULAR | Status: DC | PRN
  Administered 2020-06-29: 20 mL

## 2020-06-29 MED ORDER — SODIUM CHLORIDE 0.9 % IV SOLN
INTRAVENOUS | Status: DC | PRN
  Administered 2020-06-29: 50 ug/min via INTRAVENOUS

## 2020-06-29 MED ORDER — DEXMEDETOMIDINE HCL 200 MCG/2ML IV SOLN
INTRAVENOUS | Status: DC | PRN
  Administered 2020-06-29 (×2): 8 ug via INTRAVENOUS

## 2020-06-29 MED ORDER — ROCURONIUM BROMIDE 100 MG/10ML IV SOLN
INTRAVENOUS | Status: DC | PRN
  Administered 2020-06-29: 10 mg via INTRAVENOUS
  Administered 2020-06-29: 20 mg via INTRAVENOUS
  Administered 2020-06-29: 50 mg via INTRAVENOUS
  Administered 2020-06-29: 30 mg via INTRAVENOUS
  Administered 2020-06-29: 10 mg via INTRAVENOUS
  Administered 2020-06-29: 20 mg via INTRAVENOUS

## 2020-06-29 MED ORDER — PHENYLEPHRINE HCL (PRESSORS) 10 MG/ML IV SOLN
INTRAVENOUS | Status: DC | PRN
  Administered 2020-06-29 (×4): 200 ug via INTRAVENOUS
  Administered 2020-06-29: 100 ug via INTRAVENOUS
  Administered 2020-06-29: 200 ug via INTRAVENOUS
  Administered 2020-06-29: 100 ug via INTRAVENOUS
  Administered 2020-06-29 (×2): 200 ug via INTRAVENOUS
  Administered 2020-06-29 (×2): 100 ug via INTRAVENOUS
  Administered 2020-06-29: 200 ug via INTRAVENOUS

## 2020-06-29 MED ORDER — FENTANYL CITRATE (PF) 100 MCG/2ML IJ SOLN: 25.0000 ug | INTRAMUSCULAR | Status: DC | PRN

## 2020-06-29 MED ORDER — ONDANSETRON HCL 4 MG/2ML IJ SOLN: 4.0000 mg | Freq: Once | INTRAMUSCULAR | Status: DC | PRN

## 2020-06-29 MED ORDER — LIDOCAINE HCL (CARDIAC) PF 100 MG/5ML IV SOSY
PREFILLED_SYRINGE | INTRAVENOUS | Status: DC | PRN
  Administered 2020-06-29: 80 mg via INTRAVENOUS

## 2020-06-29 MED ORDER — SODIUM CHLORIDE FLUSH 0.9 % IV SOLN
INTRAVENOUS | Status: AC
  Filled 2020-06-29: qty 20

## 2020-06-29 MED ORDER — PHENOL 1.4 % MT LIQD
1.0000 | OROMUCOSAL | Status: DC | PRN
  Filled 2020-06-29: qty 177

## 2020-06-29 MED ORDER — LACTATED RINGERS IV SOLN: INTRAVENOUS | Status: DC | PRN

## 2020-06-29 MED ORDER — PROPOFOL 10 MG/ML IV BOLUS
INTRAVENOUS | Status: DC | PRN
  Administered 2020-06-29: 150 mg via INTRAVENOUS

## 2020-06-29 MED ORDER — DEXAMETHASONE SODIUM PHOSPHATE 10 MG/ML IJ SOLN
INTRAMUSCULAR | Status: DC | PRN
  Administered 2020-06-29: 10 mg via INTRAVENOUS

## 2020-06-29 MED ORDER — KETAMINE HCL 50 MG/ML IJ SOLN
INTRAMUSCULAR | Status: DC | PRN
  Administered 2020-06-29: 50 mg via INTRAMUSCULAR

## 2020-06-29 MED ORDER — FENTANYL CITRATE (PF) 100 MCG/2ML IJ SOLN
INTRAMUSCULAR | Status: DC | PRN
  Administered 2020-06-29 (×3): 50 ug via INTRAVENOUS

## 2020-06-29 SURGICAL SUPPLY — 90 items
BAG DECANTER FOR FLEXI CONT (MISCELLANEOUS) ×3 IMPLANT
BLADE SURG 15 STRL LF DISP TIS (BLADE) ×2 IMPLANT
BLADE SURG 15 STRL SS (BLADE) ×1
BLADE SURG SZ11 CARB STEEL (BLADE) ×3 IMPLANT
BRONCHOSCOPE BFLEX 3.8 (MISCELLANEOUS) ×3 IMPLANT
CANISTER SUCT 1200ML W/VALVE (MISCELLANEOUS) ×3 IMPLANT
CATH COUDE FOLEY 2W 5CC 16FR (CATHETERS) ×1 IMPLANT
CATH URET ROBINSON 16FR STRL (CATHETERS) IMPLANT
CHLORAPREP W/TINT 26 (MISCELLANEOUS) ×6 IMPLANT
CONN REDUCER 1/4X3/8 STR (CONNECTOR) ×3
CONNECTOR REDUCER 1/4X3/8 STR (CONNECTOR) IMPLANT
COVER LIGHT HANDLE STERIS (MISCELLANEOUS) ×6 IMPLANT
CUTTER ECHEON FLEX ENDO 45 340 (ENDOMECHANICALS) IMPLANT
DEFOGGER SCOPE WARMER CLEARIFY (MISCELLANEOUS) ×3 IMPLANT
DRAIN CHANNEL 28F RND 3/8 FF (WOUND CARE) ×1 IMPLANT
DRAIN CHEST DRY SUCT SGL (MISCELLANEOUS) ×2 IMPLANT
DRAPE C-ARM XRAY 36X54 (DRAPES) ×3 IMPLANT
DRAPE C-SECTION (MISCELLANEOUS) ×3 IMPLANT
DRAPE MAG INST 16X20 L/F (DRAPES) ×3 IMPLANT
DRSG OPSITE POSTOP 3X4 (GAUZE/BANDAGES/DRESSINGS) ×9 IMPLANT
DRSG TEGADERM 2-3/8X2-3/4 SM (GAUZE/BANDAGES/DRESSINGS) ×3 IMPLANT
DRSG TEGADERM 4X4.75 (GAUZE/BANDAGES/DRESSINGS) ×3 IMPLANT
DRSG TEGADERM 6X8 (GAUZE/BANDAGES/DRESSINGS) ×2 IMPLANT
DRSG TELFA 4X3 1S NADH ST (GAUZE/BANDAGES/DRESSINGS) ×3 IMPLANT
ELECT BLADE 6 FLAT ULTRCLN (ELECTRODE) ×3 IMPLANT
ELECT BLADE 6.5 EXT (BLADE) ×3 IMPLANT
ELECT CAUTERY BLADE 6.4 (BLADE) ×1 IMPLANT
ELECT CAUTERY BLADE TIP 2.5 (TIP) ×3
ELECT REM PT RETURN 9FT ADLT (ELECTROSURGICAL) ×3
ELECTRODE CAUTERY BLDE TIP 2.5 (TIP) ×2 IMPLANT
ELECTRODE REM PT RTRN 9FT ADLT (ELECTROSURGICAL) ×2 IMPLANT
GAUZE SPONGE 4X4 12PLY STRL (GAUZE/BANDAGES/DRESSINGS) ×4 IMPLANT
GLOVE SURG SYN 7.5  E (GLOVE) ×2
GLOVE SURG SYN 7.5 E (GLOVE) ×4 IMPLANT
GLOVE SURG SYN 7.5 PF PI (GLOVE) ×4 IMPLANT
GOWN STRL REUS W/ TWL LRG LVL3 (GOWN DISPOSABLE) ×8 IMPLANT
GOWN STRL REUS W/TWL LRG LVL3 (GOWN DISPOSABLE) ×4
HOLDER FOLEY CATH W/STRAP (MISCELLANEOUS) ×1 IMPLANT
IV NS 500ML (IV SOLUTION) ×1
IV NS 500ML BAXH (IV SOLUTION) ×2 IMPLANT
KIT PLEURX DRAIN CATH 15.5FR (DRAIN) ×1 IMPLANT
KIT PORT POWER 8FR ISP CVUE (Port) ×3 IMPLANT
KIT TURNOVER KIT A (KITS) ×3 IMPLANT
LABEL OR SOLS (LABEL) ×3 IMPLANT
LOOP RED MAXI  1X406MM (MISCELLANEOUS)
LOOP VESSEL MAXI 1X406 RED (MISCELLANEOUS) IMPLANT
MARKER SKIN DUAL TIP RULER LAB (MISCELLANEOUS) ×3 IMPLANT
NDL FILTER BLUNT 18X1 1/2 (NEEDLE) ×2 IMPLANT
NDL SPNL 20GX3.5 QUINCKE YW (NEEDLE) ×2 IMPLANT
NEEDLE FILTER BLUNT 18X 1/2SAF (NEEDLE) ×1
NEEDLE FILTER BLUNT 18X1 1/2 (NEEDLE) ×2 IMPLANT
NEEDLE HYPO 22GX1.5 SAFETY (NEEDLE) ×7 IMPLANT
NEEDLE SPNL 20GX3.5 QUINCKE YW (NEEDLE) ×3 IMPLANT
PACK BASIN MAJOR ARMC (MISCELLANEOUS) ×3 IMPLANT
PACK PORT-A-CATH (MISCELLANEOUS) ×3 IMPLANT
SCALPEL PROTECTED #15 DISP (BLADE) ×1 IMPLANT
SCISSORS METZENBAUM CVD 33 (INSTRUMENTS) IMPLANT
SPONGE KITTNER 5P (MISCELLANEOUS) ×4 IMPLANT
STAPLER SKIN PROX 35W (STAPLE) IMPLANT
STAPLER VASCULAR ECHELON 35 (CUTTER) IMPLANT
STRIP CLOSURE SKIN 1/2X4 (GAUZE/BANDAGES/DRESSINGS) ×1 IMPLANT
STRIP CLOSURE SKIN 1/4X4 (GAUZE/BANDAGES/DRESSINGS) ×1 IMPLANT
SUT ETHILON 4-0 (SUTURE) ×2
SUT ETHILON 4-0 FS2 18XMFL BLK (SUTURE) ×4
SUT MNCRL 4-0 (SUTURE) ×2
SUT MNCRL 4-0 27XMFL (SUTURE) ×4
SUT PROLENE 2 0 SH DA (SUTURE) ×8 IMPLANT
SUT SILK 1 SH (SUTURE) ×11 IMPLANT
SUT VIC AB 0 CT1 36 (SUTURE) ×4 IMPLANT
SUT VIC AB 2-0 CT1 27 (SUTURE) ×3
SUT VIC AB 2-0 CT1 TAPERPNT 27 (SUTURE) ×4 IMPLANT
SUT VIC AB 2-0 SH 27 (SUTURE) ×2
SUT VIC AB 2-0 SH 27XBRD (SUTURE) ×2 IMPLANT
SUT VIC AB 3-0 SH 27 (SUTURE) ×2
SUT VIC AB 3-0 SH 27X BRD (SUTURE) ×2 IMPLANT
SUT VICRYL 2 TP 1 (SUTURE) IMPLANT
SUTURE ETHLN 4-0 FS2 18XMF BLK (SUTURE) ×4 IMPLANT
SUTURE MNCRL 4-0 27XMF (SUTURE) ×4 IMPLANT
SYR 10ML SLIP (SYRINGE) ×3 IMPLANT
SYR 20ML LL LF (SYRINGE) ×6 IMPLANT
SYR 3ML LL SCALE MARK (SYRINGE) ×4 IMPLANT
SYR BULB IRRIG 60ML STRL (SYRINGE) ×4 IMPLANT
TAPE CLOTH 3X10 WHT NS LF (GAUZE/BANDAGES/DRESSINGS) ×3 IMPLANT
TAPE TRANSPORE STRL 2 31045 (GAUZE/BANDAGES/DRESSINGS) ×2 IMPLANT
TRAY FOLEY MTR SLVR 16FR STAT (SET/KITS/TRAYS/PACK) ×3 IMPLANT
TROCAR FLEXIPATH 20X80 (ENDOMECHANICALS) ×1 IMPLANT
TROCAR FLEXIPATH THORACIC 15MM (ENDOMECHANICALS) IMPLANT
TUBING CONNECTING 10 (TUBING) ×1 IMPLANT
WATER STERILE IRR 1000ML POUR (IV SOLUTION) ×3 IMPLANT
YANKAUER SUCT BULB TIP FLEX NO (MISCELLANEOUS) ×3 IMPLANT

## 2020-06-29 NOTE — Anesthesia Preprocedure Evaluation (Signed)
Anesthesia Evaluation  Patient identified by MRN, date of birth, ID band Patient awake    Reviewed: Allergy & Precautions, H&P , NPO status , Patient's Chart, lab work & pertinent test results  History of Anesthesia Complications Negative for: history of anesthetic complications  Airway Mallampati: II  TM Distance: >3 FB     Dental  (+) Upper Dentures, Lower Dentures   Pulmonary shortness of breath and with exertion, COPD (mild, does not use inhalers), neg recent URI, former smoker,           Cardiovascular hypertension, (-) angina(-) Past MI and (-) Cardiac Stents (-) dysrhythmias (-) Valvular Problems/Murmurs  December 2020: Stable ascending thoracic aortic aneurysm, 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   Neuro/Psych negative neurological ROS  negative psych ROS   GI/Hepatic GERD  Controlled,(+) Cirrhosis       ,   Endo/Other  negative endocrine ROS  Renal/GU negative Renal ROS     Musculoskeletal   Abdominal   Peds  Hematology negative hematology ROS (+)   Anesthesia Other Findings Past Medical History: No date: Anemia No date: Back pain No date: BPH (benign prostatic hyperplasia) No date: Cirrhosis (HCC) No date: COPD (chronic obstructive pulmonary disease) (HCC) No date: ED (erectile dysfunction) No date: GERD (gastroesophageal reflux disease) No date: Hyperlipidemia No date: Hypertension  Past Surgical History: 01/03/2018: COLONOSCOPY; Left     Comment:  Procedure: COLONOSCOPY;  Surgeon: Virgel Manifold,               MD;  Location: [facility] ENDOSCOPY;  Service: Endoscopy;                Laterality: Left; 04/05/2018: COLONOSCOPY WITH PROPOFOL; N/A     Comment:  Procedure: COLONOSCOPY WITH PROPOFOL;  Surgeon:               Virgel Manifold, MD;  Location: [facility] ENDOSCOPY;                Service: Endoscopy;  Laterality: N/A; 01/03/2018: ENTEROSCOPY     Comment:  Procedure: ENTEROSCOPY;  Surgeon: Virgel Manifold,                MD;  Location: [facility] ENDOSCOPY;  Service: Endoscopy;; 01/02/2018: ESOPHAGOGASTRODUODENOSCOPY; N/A     Comment:  Procedure: ESOPHAGOGASTRODUODENOSCOPY (EGD);  Surgeon:               Virgel Manifold, MD;  Location: [facility] ENDOSCOPY;                Service: Endoscopy;  Laterality: N/A; 04/05/2018: ESOPHAGOGASTRODUODENOSCOPY (EGD) WITH PROPOFOL; N/A     Comment:  Procedure: ESOPHAGOGASTRODUODENOSCOPY (EGD) WITH               PROPOFOL;  Surgeon: Virgel Manifold, MD;  Location:               [facility] ENDOSCOPY;  Service: Endoscopy;  Laterality: N/A; No date: HERNIA REPAIR     Comment:  1980's     Reproductive/Obstetrics negative OB ROS                             Anesthesia Physical  Anesthesia Plan  ASA: III  Anesthesia Plan: General   Post-op Pain Management:    Induction: Intravenous  PONV Risk Score and Plan: Ondansetron, Dexamethasone and Treatment may vary due to age or medical condition  Airway Management Planned: Double Lumen EBT  Additional Equipment:   Intra-op Plan:  Post-operative Plan: Extubation in OR  Informed Consent: I have reviewed the patients History and Physical, chart, labs and discussed the procedure including the risks, benefits and alternatives for the proposed anesthesia with the patient or authorized representative who has indicated his/her understanding and acceptance.     Dental Advisory Given  Plan Discussed with: Anesthesiologist  Anesthesia Plan Comments:         Anesthesia Quick Evaluation

## 2020-06-29 NOTE — Care Management Important Message (Signed)
Important Message  Patient Details  Name: Dwayne Thompson MRN: [id number] Date of Birth: 03-06-1946   Medicare Important Message Given:  Yes     [name] 06/29/2020, 12:11 PM

## 2020-06-29 NOTE — Anesthesia Procedure Notes (Signed)
Procedure Name: Intubation Date/Time: 06/29/2020 8:05 AM Performed by: Tollie Eth, CRNA Pre-anesthesia Checklist: Patient identified, Patient being monitored, Timeout performed, Emergency Drugs available and Suction available Patient Re-evaluated:Patient Re-evaluated prior to induction Oxygen Delivery Method: Circle system utilized Preoxygenation: Pre-oxygenation with 100% oxygen Induction Type: IV induction Ventilation: Mask ventilation without difficulty Laryngoscope Size: McGraph and 4 Grade View: Grade I Tube type: Oral Endobronchial tube: Left, Double lumen EBT, EBT position confirmed by auscultation and EBT position confirmed by fiberoptic bronchoscope and 37 Fr Tube size: 7.0 mm Number of attempts: 2 Airway Equipment and Method: Stylet,  LTA kit utilized,  Video-laryngoscopy and Fiberoptic brochoscope Placement Confirmation: ETT inserted through vocal cords under direct vision,  positive ETCO2 and breath sounds checked- equal and bilateral Tube secured with: Tape Dental Injury: Teeth and Oropharynx as per pre-operative assessment  Comments: 36f Left tube attempted first. Unable to advance. Switched out for 37Fr

## 2020-06-29 NOTE — Patient Instructions (Signed)
Atezolizumab injection What is this medicine? ATEZOLIZUMAB (a te zoe LIZ ue mab) is a monoclonal antibody. It is used to treat bladder cancer (urothelial cancer), liver cancer, lung cancer, breast cancer, and melanoma. This medicine may be used for other purposes; ask your health care provider or pharmacist if you have questions. COMMON BRAND NAME(S): Tecentriq What should I tell my health care provider before I take this medicine? They need to know if you have any of these conditions:  diabetes  immune system problems  infection  inflammatory bowel disease  liver disease  lung or breathing disease  lupus  nervous system problems like myasthenia gravis or Guillain-Barre syndrome  organ transplant  an unusual or allergic reaction to atezolizumab, other medicines, foods, dyes, or preservatives  pregnant or trying to get pregnant  breast-feeding How should I use this medicine? This medicine is for infusion into a vein. It is given by a health care professional in a hospital or clinic setting. A special MedGuide will be given to you before each treatment. Be sure to read this information carefully each time. Talk to your pediatrician regarding the use of this medicine in children. Special care may be needed. Overdosage: If you think you have taken too much of this medicine contact a poison control center or emergency room at once. NOTE: This medicine is only for you. Do not share this medicine with others. What if I miss a dose? It is important not to miss your dose. Call your doctor or health care professional if you are unable to keep an appointment. What may interact with this medicine? Interactions have not been studied. This list may not describe all possible interactions. Give your health care provider a list of all the medicines, herbs, non-prescription drugs, or dietary supplements you use. Also tell them if you smoke, drink alcohol, or use illegal drugs. Some items may  interact with your medicine. What should I watch for while using this medicine? Your condition will be monitored carefully while you are receiving this medicine. You may need blood work done while you are taking this medicine. Do not become pregnant while taking this medicine or for at least 5 months after stopping it. Women should inform their doctor if they wish to become pregnant or think they might be pregnant. There is a potential for serious side effects to an unborn child. Talk to your health care professional or pharmacist for more information. Do not breast-feed an infant while taking this medicine or for at least 5 months after the last dose. What side effects may I notice from receiving this medicine? Side effects that you should report to your doctor or health care professional as soon as possible:  allergic reactions like skin rash, itching or hives, swelling of the face, lips, or tongue  black, tarry stools  bloody or watery diarrhea  breathing problems  changes in vision  chest pain or chest tightness  chills  facial flushing  fever  headache  signs and symptoms of high blood sugar such as dizziness; dry mouth; dry skin; fruity breath; nausea; stomach pain; increased hunger or thirst; increased urination  signs and symptoms of liver injury like dark yellow or brown urine; general ill feeling or flu-like symptoms; light-colored stools; loss of appetite; nausea; right upper belly pain; unusually weak or tired; yellowing of the eyes or skin  stomach pain  trouble passing urine or change in the amount of urine Side effects that usually do not require medical attention (report to  your doctor or health care professional if they continue or are bothersome):  bone pain  cough  diarrhea  joint pain  muscle pain  muscle weakness  swelling of arms or legs  tiredness  weight loss This list may not describe all possible side effects. Call your doctor for  medical advice about side effects. You may report side effects to FDA at 1-800-FDA-1088. Where should I keep my medicine? This drug is given in a hospital or clinic and will not be stored at home. NOTE: This sheet is a summary. It may not cover all possible information. If you have questions about this medicine, talk to your doctor, pharmacist, or health care provider.  2020 Elsevier/Gold Standard (2019-04-26 13:11:14) Etoposide, VP-16 injection What is this medicine? ETOPOSIDE, VP-16 (e toe POE side) is a chemotherapy drug. It is used to treat testicular cancer, lung cancer, and other cancers. This medicine may be used for other purposes; ask your health care provider or pharmacist if you have questions. COMMON BRAND NAME(S): Etopophos, Toposar, VePesid What should I tell my health care provider before I take this medicine? They need to know if you have any of these conditions:  infection  kidney disease  liver disease  low blood counts, like low white cell, platelet, or red cell counts  an unusual or allergic reaction to etoposide, other medicines, foods, dyes, or preservatives  pregnant or trying to get pregnant  breast-feeding How should I use this medicine? This medicine is for infusion into a vein. It is administered in a hospital or clinic by a specially trained health care professional. Talk to your pediatrician regarding the use of this medicine in children. Special care may be needed. Overdosage: If you think you have taken too much of this medicine contact a poison control center or emergency room at once. NOTE: This medicine is only for you. Do not share this medicine with others. What if I miss a dose? It is important not to miss your dose. Call your doctor or health care professional if you are unable to keep an appointment. What may interact with this medicine? This medicine may interact with the following medications:  warfarin This list may not describe all  possible interactions. Give your health care provider a list of all the medicines, herbs, non-prescription drugs, or dietary supplements you use. Also tell them if you smoke, drink alcohol, or use illegal drugs. Some items may interact with your medicine. What should I watch for while using this medicine? Visit your doctor for checks on your progress. This drug may make you feel generally unwell. This is not uncommon, as chemotherapy can affect healthy cells as well as cancer cells. Report any side effects. Continue your course of treatment even though you feel ill unless your doctor tells you to stop. In some cases, you may be given additional medicines to help with side effects. Follow all directions for their use. Call your doctor or health care professional for advice if you get a fever, chills or sore throat, or other symptoms of a cold or flu. Do not treat yourself. This drug decreases your body's ability to fight infections. Try to avoid being around people who are sick. This medicine may increase your risk to bruise or bleed. Call your doctor or health care professional if you notice any unusual bleeding. Talk to your doctor about your risk of cancer. You may be more at risk for certain types of cancers if you take this medicine. Do not become  pregnant while taking this medicine or for at least 6 months after stopping it. Women should inform their doctor if they wish to become pregnant or think they might be pregnant. Women of child-bearing potential will need to have a negative pregnancy test before starting this medicine. There is a potential for serious side effects to an unborn child. Talk to your health care professional or pharmacist for more information. Do not breast-feed an infant while taking this medicine. Men must use a latex condom during sexual contact with a woman while taking this medicine and for at least 4 months after stopping it. A latex condom is needed even if you have had a  vasectomy. Contact your doctor right away if your partner becomes pregnant. Do not donate sperm while taking this medicine and for at least 4 months after you stop taking this medicine. Men should inform their doctors if they wish to father a child. This medicine may lower sperm counts. What side effects may I notice from receiving this medicine? Side effects that you should report to your doctor or health care professional as soon as possible:  allergic reactions like skin rash, itching or hives, swelling of the face, lips, or tongue  low blood counts - this medicine may decrease the number of white blood cells, red blood cells, and platelets. You may be at increased risk for infections and bleeding  nausea, vomiting  redness, blistering, peeling or loosening of the skin, including inside the mouth  signs and symptoms of infection like fever; chills; cough; sore throat; pain or trouble passing urine  signs and symptoms of low red blood cells or anemia such as unusually weak or tired; feeling faint or lightheaded; falls; breathing problems  unusual bruising or bleeding Side effects that usually do not require medical attention (report to your doctor or health care professional if they continue or are bothersome):  changes in taste  diarrhea  hair loss  loss of appetite  mouth sores This list may not describe all possible side effects. Call your doctor for medical advice about side effects. You may report side effects to FDA at 1-800-FDA-1088. Where should I keep my medicine? This drug is given in a hospital or clinic and will not be stored at home. NOTE: This sheet is a summary. It may not cover all possible information. If you have questions about this medicine, talk to your doctor, pharmacist, or health care provider.  2020 Elsevier/Gold Standard (2018-10-31 16:57:15) Carboplatin injection What is this medicine? CARBOPLATIN (KAR boe pla tin) is a chemotherapy drug. It targets  fast dividing cells, like cancer cells, and causes these cells to die. This medicine is used to treat ovarian cancer and many other cancers. This medicine may be used for other purposes; ask your health care provider or pharmacist if you have questions. COMMON BRAND NAME(S): Paraplatin What should I tell my health care provider before I take this medicine? They need to know if you have any of these conditions:  blood disorders  hearing problems  kidney disease  recent or ongoing radiation therapy  an unusual or allergic reaction to carboplatin, cisplatin, other chemotherapy, other medicines, foods, dyes, or preservatives  pregnant or trying to get pregnant  breast-feeding How should I use this medicine? This drug is usually given as an infusion into a vein. It is administered in a hospital or clinic by a specially trained health care professional. Talk to your pediatrician regarding the use of this medicine in children. Special care may  be needed. Overdosage: If you think you have taken too much of this medicine contact a poison control center or emergency room at once. NOTE: This medicine is only for you. Do not share this medicine with others. What if I miss a dose? It is important not to miss a dose. Call your doctor or health care professional if you are unable to keep an appointment. What may interact with this medicine?  medicines for seizures  medicines to increase blood counts like filgrastim, pegfilgrastim, sargramostim  some antibiotics like amikacin, gentamicin, neomycin, streptomycin, tobramycin  vaccines Talk to your doctor or health care professional before taking any of these medicines:  acetaminophen  aspirin  ibuprofen  ketoprofen  naproxen This list may not describe all possible interactions. Give your health care provider a list of all the medicines, herbs, non-prescription drugs, or dietary supplements you use. Also tell them if you smoke, drink  alcohol, or use illegal drugs. Some items may interact with your medicine. What should I watch for while using this medicine? Your condition will be monitored carefully while you are receiving this medicine. You will need important blood work done while you are taking this medicine. This drug may make you feel generally unwell. This is not uncommon, as chemotherapy can affect healthy cells as well as cancer cells. Report any side effects. Continue your course of treatment even though you feel ill unless your doctor tells you to stop. In some cases, you may be given additional medicines to help with side effects. Follow all directions for their use. Call your doctor or health care professional for advice if you get a fever, chills or sore throat, or other symptoms of a cold or flu. Do not treat yourself. This drug decreases your body's ability to fight infections. Try to avoid being around people who are sick. This medicine may increase your risk to bruise or bleed. Call your doctor or health care professional if you notice any unusual bleeding. Be careful brushing and flossing your teeth or using a toothpick because you may get an infection or bleed more easily. If you have any dental work done, tell your dentist you are receiving this medicine. Avoid taking products that contain aspirin, acetaminophen, ibuprofen, naproxen, or ketoprofen unless instructed by your doctor. These medicines may hide a fever. Do not become pregnant while taking this medicine. Women should inform their doctor if they wish to become pregnant or think they might be pregnant. There is a potential for serious side effects to an unborn child. Talk to your health care professional or pharmacist for more information. Do not breast-feed an infant while taking this medicine. What side effects may I notice from receiving this medicine? Side effects that you should report to your doctor or health care professional as soon as  possible:  allergic reactions like skin rash, itching or hives, swelling of the face, lips, or tongue  signs of infection - fever or chills, cough, sore throat, pain or difficulty passing urine  signs of decreased platelets or bleeding - bruising, pinpoint red spots on the skin, black, tarry stools, nosebleeds  signs of decreased red blood cells - unusually weak or tired, fainting spells, lightheadedness  breathing problems  changes in hearing  changes in vision  chest pain  high blood pressure  low blood counts - This drug may decrease the number of white blood cells, red blood cells and platelets. You may be at increased risk for infections and bleeding.  nausea and vomiting  pain, swelling, redness or irritation at the injection site  pain, tingling, numbness in the hands or feet  problems with balance, talking, walking  trouble passing urine or change in the amount of urine Side effects that usually do not require medical attention (report to your doctor or health care professional if they continue or are bothersome):  hair loss  loss of appetite  metallic taste in the mouth or changes in taste This list may not describe all possible side effects. Call your doctor for medical advice about side effects. You may report side effects to FDA at 1-800-FDA-1088. Where should I keep my medicine? This drug is given in a hospital or clinic and will not be stored at home. NOTE: This sheet is a summary. It may not cover all possible information. If you have questions about this medicine, talk to your doctor, pharmacist, or health care provider.  2020 Elsevier/Gold Standard (2007-12-11 14:38:05)

## 2020-06-29 NOTE — Transfer of Care (Signed)
Immediate Anesthesia Transfer of Care Note  Patient: Dwayne Thompson  Procedure(s) Performed: VIDEO ASSISTED THORACOSCOPY (Left ) INSERTION PORT-A-CATH (Left Chest)  Patient Location: PACU  Anesthesia Type:General  Level of Consciousness: drowsy  Airway & Oxygen Therapy: Patient Spontanous Breathing and Patient connected to face mask oxygen  Post-op Assessment: Report given to RN and Post -op Vital signs reviewed and stable  Post vital signs: Reviewed and stable  Last Vitals:  Vitals Value Taken Time  BP    Temp    Pulse    Resp    SpO2      Last Pain:  Vitals:   06/29/20 0404  TempSrc:   PainSc: 1       Patients Stated Pain Goal: 3 (62/03/55 [id number])  Complications: No complications documented.

## 2020-06-29 NOTE — Interval H&P Note (Signed)
History and Physical Interval Note:  06/29/2020 7:06 AM  Dwayne Thompson  has presented today for surgery, with the diagnosis of THORACIC MASS.  The various methods of treatment have been discussed with the patient and family. After consideration of risks, benefits and other options for treatment, the patient has consented to  Procedure(s): VIDEO ASSISTED THORACOSCOPY WITH TALC PLEURADESIS (Left) TALC PLEURADESIS (Left) INSERTION PORT-A-CATH (N/A) as a surgical intervention.  The patient's history has been reviewed, patient examined, no change in status, stable for surgery.  I have reviewed the patient's chart and labs.  Questions were answered to the patient's satisfaction.     [name]

## 2020-06-29 NOTE — Progress Notes (Signed)
PROGRESS NOTE    Dwayne Thompson  EGB:[id number] DOB: Jul 23, 1946 DOA: 06/22/2020 PCP: Jerrol Banana., MD   Assessment & Plan:   Principal Problem:   Acute respiratory failure [facility]) Active Problems:   BPH with obstruction/lower urinary tract symptoms   Thrombocytopenia ([place])   Hypertension   Cirrhosis ([place])   Hemothorax on left   Pleural mass   Bone metastasis ([facility])   Pressure injury of skin   Hemothorax   Chest tube in place   Small cell lung cancer: stage IV. Chemo is only palliative as per onco. Oncology following and recs apprec. Hx of 50 pack year, quit smoking in 2015.   Left hemothorax: secondary to small cell lung cancer. S/p chest tube placement. Pt denies any trauma. S/p thoracoscopy w/ limited decortication of the lung, insert of pleurx cath left pleural space & insertion of left subclavian port-a-cath as per gen surg 06/29/20. Fentanyl, oxycodone prn for pain.  Gen surg recs apprec   Acute hypoxic respiratory failure: likely secondary to hemothorax. S/p chest tube placement. Resolved  Hyperkalemia: resolved   AKI: likely pre-renal. Resolved   GERD: continue on pantoprazole   BPH: continue on home dose of avodart. Hold home dose of tamsulosin secondary to hypotension  Cirrhosis: not on any meds for this. Stable. Will need to f/u outpatient w/ GI. Pt verbalized his understanding   ACD: will transfuse if Hb <7.0. Will continue to monitor   Hypotension: intermittently, possibly secondary to pain meds. Continue on midodrine.    DVT prophylaxis: SCDs Code Status: full  Family Communication: discussed pt's care w/ pt's wife at bedside  and answered her questions  Disposition Plan: likely d/c home w/ home health   Status is: Inpatient  Remains inpatient appropriate because:Ongoing diagnostic testing needed not appropriate for outpatient work up, IV treatments appropriate due to intensity of illness or inability to take PO and Inpatient level of care  appropriate due to severity of illness   Dispo: The patient is from: Home              Anticipated d/c is to: Home w/ home health               Anticipated d/c date is: > 3 days              Patient currently is not medically stable to d/c.    Consultants:  Onco IR Gen surg    Procedures:   Antimicrobials:   Subjective: Pt c/o lateral chest wall pain still   Objective: Vitals:   06/28/20 1631 06/28/20 1932 06/29/20 0008 06/29/20 0259  BP:  94/60 (!) 89/61 95/66  Pulse: (!) 108 98 92 98  Resp:  20  19  Temp: 99 F (37.2 C) 98.9 F (37.2 C) 98.6 F (37 C) 98.2 F (36.8 C)  TempSrc: Oral  Oral Oral  SpO2: 91% 93% 94% 92%  Weight:      Height:        Intake/Output Summary (Last 24 hours) at 06/29/2020 0712 Last data filed at 06/29/2020 0306 Gross per 24 hour  Intake 240 ml  Output 235 ml  Net 5 ml   Filed Weights   06/22/20 1112 06/23/20 0545  Weight: 76.2 kg 86.8 kg    Examination:  General exam: Appears lethargic  Respiratory system: decreased breath sounds b/l. No rales  Cardiovascular system: S1/S2+. No gallops or rubs. +1 b/l LE pitting edema Gastrointestinal system: Abd is soft, non-tender, non-distended & hypoactive bowel  sounds.  Central nervous system: Lethargic. Moves all 4 extremities  Psychiatry: Judgement and insight appear normal. Flat mood and affect     Data Reviewed: I have personally reviewed following labs and imaging studies  CBC: Recent Labs  Lab 06/26/20 0553 06/27/20 0429 06/28/20 0507 06/28/20 2249 06/29/20 0442  WBC 7.1 8.6 8.8 9.2 8.3  NEUTROABS  --   --   --  6.9  --   HGB 9.6* 10.1* 9.8* 9.8* 9.7*  HCT 27.6* 29.4* 28.8* 27.9* 27.5*  MCV 89.6 89.9 89.2 87.7 87.0  PLT 157 186 167 165 462   Basic Metabolic Panel: Recent Labs  Lab 06/25/20 0530 06/26/20 0553 06/27/20 0429 06/28/20 0507 06/28/20 2249 06/29/20 0442  NA 134* 134* 133* 132* 130*  --   K 4.5 5.1 4.7 4.2 4.3  --   CL 104 102 99 98 97*  --   CO2  24 24 27 25 25   --   GLUCOSE 94 96 102* 92 133*  --   BUN 13 16 18 14 17   --   CREATININE 0.77 0.93 0.88 0.87 0.77  --   CALCIUM 8.2* 8.3* 8.6* 8.5* 8.3*  --   MG 1.6* 1.8 1.9 1.7  --  1.7  PHOS 3.5 4.5 3.8 3.5  --  3.4   GFR: Estimated Creatinine Clearance: 86.3 mL/min (by C-G formula based on SCr of 0.77 mg/dL). Liver Function Tests: Recent Labs  Lab 06/28/20 2249  AST 46*  ALT 34  ALKPHOS 52  BILITOT 0.4  PROT 5.6*  ALBUMIN 2.5*   No results for input(s): LIPASE, AMYLASE in the last 168 hours. No results for input(s): AMMONIA in the last 168 hours. Coagulation Profile: Recent Labs  Lab 06/22/20 1404 06/28/20 2249  INR 1.2 1.0   Cardiac Enzymes: No results for input(s): CKTOTAL, CKMB, CKMBINDEX, TROPONINI in the last 168 hours. BNP (last 3 results) No results for input(s): PROBNP in the last 8760 hours. HbA1C: No results for input(s): HGBA1C in the last 72 hours. CBG: Recent Labs  Lab 06/23/20 0541 06/24/20 0737  GLUCAP 77 86   Lipid Profile: No results for input(s): CHOL, HDL, LDLCALC, TRIG, CHOLHDL, LDLDIRECT in the last 72 hours. Thyroid Function Tests: No results for input(s): TSH, T4TOTAL, FREET4, T3FREE, THYROIDAB in the last 72 hours. Anemia Panel: Recent Labs    06/27/20 0429  FERRITIN 359*  TIBC 181*  IRON 25*   Sepsis Labs: No results for input(s): PROCALCITON, LATICACIDVEN in the last 168 hours.  Recent Results (from the past 240 hour(s))  SARS CORONAVIRUS 2 (TAT 6-24 HRS) Nasopharyngeal Nasopharyngeal Swab     Status: None   Collection Time: 06/20/20  3:04 PM   Specimen: Nasopharyngeal Swab  Result Value Ref Range Status   SARS Coronavirus 2 NEGATIVE NEGATIVE Final    Comment: (NOTE) SARS-CoV-2 target nucleic acids are NOT DETECTED.  The SARS-CoV-2 RNA is generally detectable in upper and lower respiratory specimens during the acute phase of infection. Negative results do not preclude SARS-CoV-2 infection, do not rule  out co-infections with other pathogens, and should not be used as the sole basis for treatment or other patient management decisions. Negative results must be combined with clinical observations, patient history, and epidemiological information. The expected result is Negative.  Fact Sheet for Patients: SugarRoll.be  Fact Sheet for Healthcare Providers: https://www.woods-mathews.com/  This test is not yet approved or cleared by the [place] FDA and  has been authorized for detection and/or diagnosis of SARS-CoV-2 by  FDA under an Emergency Use Authorization (EUA). This EUA will remain  in effect (meaning this test can be used) for the duration of the COVID-19 declaration under Se ction 564(b)(1) of the Act, 21 U.S.C. section 360bbb-3(b)(1), unless the authorization is terminated or revoked sooner.  Performed at [facility], [place] [address] [place], [place] [address]   MRSA PCR Screening     Status: None   Collection Time: 06/23/20  5:46 AM   Specimen: Nasopharyngeal  Result Value Ref Range Status   MRSA by PCR NEGATIVE NEGATIVE Final    Comment:        The GeneXpert MRSA Assay (FDA approved for NASAL specimens only), is one component of a comprehensive MRSA colonization surveillance program. It is not intended to diagnose MRSA infection nor to guide or monitor treatment for MRSA infections. Performed at [facility], [address] [place],  [address]          Radiology Studies: MR BRAIN W WO CONTRAST  Result Date: 06/29/2020 CLINICAL DATA:  Initial evaluation for small cell lung cancer, staging. EXAM: MRI HEAD WITHOUT AND WITH CONTRAST TECHNIQUE: Multiplanar, multiecho pulse sequences of the brain and surrounding structures were obtained without and with intravenous contrast. CONTRAST:  7.66mL GADAVIST GADOBUTROL 1 MMOL/ML IV SOLN COMPARISON:  Prior MRI from 03/04/2013. FINDINGS: Brain: Examination  degraded by motion artifact. Age-related cerebral atrophy with mild-to-moderate chronic microvascular ischemic disease. No acute intracranial infarct. Gray-white matter differentiation maintained. No encephalomalacia to suggest chronic cortical infarction. No foci of susceptibility artifact to suggest acute or chronic intracranial hemorrhage. No mass lesion, midline shift or mass effect. No hydrocephalus or extra-axial fluid collection. Pituitary gland suprasellar region normal. Midline structures intact. No abnormal enhancement or findings to suggest intracranial metastatic disease. Vascular: Major intracranial vascular flow voids are maintained. Skull and upper cervical spine: Craniocervical junction within normal limits. No focal marrow replacing lesion. Multilevel degenerative spondylolysis noted within the upper cervical spine. No scalp soft tissue abnormality. Sinuses/Orbits: Globes and orbital soft tissues demonstrate no acute finding. Paranasal sinuses are largely clear. No mastoid effusion. Inner ear structures grossly normal. Other: None. IMPRESSION: 1. No evidence for intracranial metastatic disease. 2. No other acute intracranial abnormality. 3. Mild to moderate chronic microvascular ischemic disease. Electronically Signed   By: Jeannine Boga M.D.   On: 06/29/2020 03:01   MR THORACIC SPINE W WO CONTRAST  Result Date: 06/29/2020 CLINICAL DATA:  Initial evaluation for non-small cell lung cancer, pretreatment staging. Abnormality T11-12. EXAM: MRI THORACIC WITHOUT AND WITH CONTRAST TECHNIQUE: Multiplanar and multiecho pulse sequences of the thoracic spine were obtained without and with intravenous contrast. CONTRAST:  7.32mL GADAVIST GADOBUTROL 1 MMOL/ML IV SOLN COMPARISON:  Prior PET-CT from 06/12/2020. FINDINGS: MRI THORACIC SPINE FINDINGS Alignment: Vertebral bodies normally aligned with preservation of the normal thoracic kyphosis. No listhesis. Vertebrae: Signal intensity within the  visualized bone marrow is diffusely heterogeneous. A left paraspinous soft tissue mass is seen along the left anterolateral aspect of the vertebral column extending from approximately T9-10 through T12-L1 (series 20, image 18). Evidence for invasion/involvement of the adjacent T10 and T11 vertebral bodies (series 21, image 14). Tumor partially extends into the left neural foramen at T11-12 (series 40, image 34). No other significant extension into the spinal canal or epidural tumor. A separate 13 mm metastasis seen involving the central/right aspect of the T10 vertebral body (series 39, image 8). Probable additional 13 mm metastatic lesion seen at T1 (series 20, image 9). 1 cm lesion  seen within the right pedicle of T6 (series 21, image 6). No associated pathologic fracture. Cord:  Normal signal and morphology.  No significant epidural tumor. Paraspinal and other soft tissues: Paraspinous tumor as above. Paraspinous soft tissues demonstrate no other acute finding. Irregular and partially loculated left pleural effusion with scattered fluid-fluid levels partially visualized at the left lung base. Consolidative changes noted throughout the posterior left lung superiorly. Disc levels: Normal expected multilevel degenerative disc desiccation seen within the thoracic spine. Mild disc bulging noted at the T7-8 level without significant spinal stenosis. Multilevel facet hypertrophy throughout the lower thoracic spine. No significant stenosis or foraminal encroachment. IMPRESSION: 1. 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 Tumor partially extends into the left neural foramen at T11-12. No other significant extension into the spinal canal or epidural tumor. 2. Additional metastatic lesions involving the T1 and T10 vertebral bodies as well as the right pedicle of T6. No associated pathologic fracture. 3. Heterogeneous and  partially loculated left pleural effusion with consolidation signal intensity throughout the visualized left lower lung. Electronically Signed   By: Jeannine Boga M.D.   On: 06/29/2020 03:18        Scheduled Meds: . sodium chloride   Intravenous Once  . cholecalciferol  1,000 Units Oral Daily  . docusate sodium  100 mg Oral Daily  . dutasteride  0.5 mg Oral Daily  . feeding supplement (ENSURE ENLIVE)  237 mL Oral BID BM  . folic acid  1 mg Oral Daily  . lidocaine  1 patch Transdermal Q24H  . loratadine  10 mg Oral Daily  . midodrine  10 mg Oral TID WC  . montelukast  10 mg Oral Daily  . pantoprazole  20 mg Oral BID  . polyethylene glycol  17 g Oral Daily  . rosuvastatin  10 mg Oral Daily  . sodium chloride flush      . cyanocobalamin  1,000 mcg Oral Daily   Continuous Infusions: . sodium chloride Stopped (06/27/20 1115)  .  ceFAZolin (ANCEF) IV       LOS: 7 days    Time spent: 31 mins     Wyvonnia Dusky, MD [facility] Pager [phone]  If 7PM-7AM, please contact night-coverage [email or web address] 06/29/2020, 7:12 AM

## 2020-06-29 NOTE — Op Note (Signed)
06/29/2020  1:03 PM  PATIENT:  Dwayne Thompson  74 y.o. male  PRE-OPERATIVE DIAGNOSIS: Malignant pleural effusion left side  POST-OPERATIVE DIAGNOSIS: Malignant pleural effusion left side  PROCEDURE: #1 preoperative bronchoscopy to assess endobronchial anatomy #2 left thoracoscopy with limited decortication of the lung #3 insertion of Pleurx catheter left pleural space #4 insertion of left subclavian Port-A-Cath using ultrasound and fluoroscopic guidance  SURGEON:  Surgeon(s) and Role:    [name], MD - Primary  ASSISTANTS: Erenest Blank, PAS  ANESTHESIA: General  INDICATIONS FOR PROCEDURE this patient is a 74 year old gentleman who recently presented with a large left-sided pleural effusion.  An attempt was made to percutaneous drainage which was unsuccessful because of the multiple intrapleural adhesions.  Ultimately a diagnosis was established to small cell carcinoma.  However he continued to have a large pleural effusion and he was offered the above named procedures for definitive management of his pleural effusions and for administration of chemotherapy.  The indications and risks of the procedures were explained this patient and he gave his informed consent  DICTATION: The patient is brought to the operating suite and placed in supine position.  General endotracheal anesthesia was given with a double-lumen tube.  Preoperative bronchoscopy was carried out after the tube had been appropriately placed and secured.  In the distal left mainstem there is an extensive erythematous changes with some extrinsic compression of the left lower lobe.  Due to the limited nature of the disposable scope I cannot see the well into the distal airways on the left.  The right side was free of any obvious tumor.  The patient was then turned for left thoracoscopy.  All pressure points were carefully padded.  The patient was prepped and draped in usual sterile fashion.  A 20 mm port was selected in the  posterior axillary line at about the fifth interspace.  This was approximately 2 interspaces below his prior chest tube insertion.  Once the chest was entered we then suctioned approximately 5 to 600 cc of bloody fluid from the left pleural space.  I then passed the thoracoscope into the pleural space and there is extensive pleural disease.  The left lower lobe appeared to be mostly affected.  There was tumor on much of the left lower lobe and along the chest wall.  There are innumerable numbers of small pockets of pleural fluid which were broken up using a long gauze peanut on a Kelly clamp.  The upper lobe appeared to be fused to the chest wall and I could not see beyond that area to adequately drain the upper portion of the left hemithorax.  At that point we elected to just irrigate and then place a Pleurx catheter.  2 incisions were made one for 28 Blake chest tube and 1 for the Pleurx.  The Pleurx was then placed into the thoracoscopy port as was the chest tube and these were both brought out through separate stab wounds while away from each other.  The thoracoscopy port site was then closed in multiple layers a running absorbable sutures.  Finally the skin was closed with nylon.  Sterile dressings were applied.  The patient was then turned for insertion of his Port-A-Cath.  The patient was prepped and draped in usual sterile fashion.  We began by accessing the left subclavian vein.  Using ultrasound I was able to identify the axillary artery and traced it under the clavicle.  I then percutaneously catheterized the left subclavian vein.  A  wire was then passed through the vein into the right side of the heart.  The port site was then selected on the anterior chest wall and a small skin incision was made to accommodate the port.  We then created a pocket inferiorly and then tunneled the catheter from our port site up to the subclavian vein puncture site.  The peel-away sheath was then introduced over the wire  and the catheter was inserted through the peel-away sheath.  It was then positioned at the cavoatrial junction.  The catheter was then clamped and the port assembly was assembled correctly.  It was then secured to the pectoralis fascia with 3 stitches of 2-0 Prolene.  The catheter was irrigated and flushed.  It irrigated and aspirated without difficulty.  Fluoroscopy confirmed the catheter to be in the proper position.  The wounds were then closed in multiple layers using running absorbable sutures.  The skin was closed with nylon.  Sterile dressings were applied.  Patient was then awakened from general endotracheal anesthesia was taken to the recovery room in stable condition.   [name], MD

## 2020-06-29 NOTE — Anesthesia Postprocedure Evaluation (Signed)
Anesthesia Post Note  Patient: Dwayne Thompson  Procedure(s) Performed: VIDEO ASSISTED THORACOSCOPY (Left ) INSERTION PORT-A-CATH (Left Chest)  Patient location during evaluation: PACU Anesthesia Type: General Level of consciousness: awake and alert Pain management: pain level controlled Vital Signs Assessment: post-procedure vital signs reviewed and stable Respiratory status: spontaneous breathing, nonlabored ventilation, respiratory function stable and patient connected to nasal cannula oxygen Cardiovascular status: blood pressure returned to baseline and stable Postop Assessment: no apparent nausea or vomiting Anesthetic complications: no   No complications documented.   Last Vitals:  Vitals:   06/29/20 1152 06/29/20 1254  BP: 96/63 92/61  Pulse:    Resp:    Temp:  (!) 36.2 C  SpO2: 91%     Last Pain:  Vitals:   06/29/20 1254  TempSrc:   PainSc: 0-No pain                 Martha Clan

## 2020-06-30 ENCOUNTER — Inpatient Hospital Stay: Payer: Medicare Other | Attending: Hematology and Oncology

## 2020-06-30 ENCOUNTER — Encounter: Payer: Self-pay | Admitting: Cardiothoracic Surgery

## 2020-06-30 DIAGNOSIS — E785 Hyperlipidemia, unspecified: Secondary | ICD-10-CM | POA: Insufficient documentation

## 2020-06-30 DIAGNOSIS — C3492 Malignant neoplasm of unspecified part of left bronchus or lung: Secondary | ICD-10-CM | POA: Insufficient documentation

## 2020-06-30 DIAGNOSIS — K7460 Unspecified cirrhosis of liver: Secondary | ICD-10-CM | POA: Diagnosis not present

## 2020-06-30 DIAGNOSIS — G893 Neoplasm related pain (acute) (chronic): Secondary | ICD-10-CM | POA: Insufficient documentation

## 2020-06-30 DIAGNOSIS — J942 Hemothorax: Secondary | ICD-10-CM | POA: Diagnosis not present

## 2020-06-30 DIAGNOSIS — Z5189 Encounter for other specified aftercare: Secondary | ICD-10-CM | POA: Insufficient documentation

## 2020-06-30 DIAGNOSIS — Z5111 Encounter for antineoplastic chemotherapy: Secondary | ICD-10-CM | POA: Insufficient documentation

## 2020-06-30 DIAGNOSIS — C801 Malignant (primary) neoplasm, unspecified: Secondary | ICD-10-CM | POA: Diagnosis not present

## 2020-06-30 DIAGNOSIS — D649 Anemia, unspecified: Secondary | ICD-10-CM | POA: Insufficient documentation

## 2020-06-30 DIAGNOSIS — Z5112 Encounter for antineoplastic immunotherapy: Secondary | ICD-10-CM | POA: Insufficient documentation

## 2020-06-30 DIAGNOSIS — I10 Essential (primary) hypertension: Secondary | ICD-10-CM | POA: Insufficient documentation

## 2020-06-30 DIAGNOSIS — C7951 Secondary malignant neoplasm of bone: Secondary | ICD-10-CM | POA: Insufficient documentation

## 2020-06-30 DIAGNOSIS — Z87891 Personal history of nicotine dependence: Secondary | ICD-10-CM | POA: Insufficient documentation

## 2020-06-30 DIAGNOSIS — Z79899 Other long term (current) drug therapy: Secondary | ICD-10-CM | POA: Insufficient documentation

## 2020-06-30 DIAGNOSIS — J449 Chronic obstructive pulmonary disease, unspecified: Secondary | ICD-10-CM | POA: Insufficient documentation

## 2020-06-30 LAB — COMPREHENSIVE METABOLIC PANEL
ALT: 30 U/L (ref 0–44)
AST: 36 U/L (ref 15–41)
Albumin: 2.4 g/dL — ABNORMAL LOW (ref 3.5–5.0)
Alkaline Phosphatase: 45 U/L (ref 38–126)
Anion gap: 8 (ref 5–15)
BUN: 12 mg/dL (ref 8–23)
CO2: 26 mmol/L (ref 22–32)
Calcium: 8.3 mg/dL — ABNORMAL LOW (ref 8.9–10.3)
Chloride: 97 mmol/L — ABNORMAL LOW (ref 98–111)
Creatinine, Ser: 0.74 mg/dL (ref 0.61–1.24)
GFR, Estimated: >60 mL/min (ref >60)
Glucose, Bld: 100 mg/dL — ABNORMAL HIGH (ref 70–99)
Potassium: 4.1 mmol/L (ref 3.5–5.1)
Sodium: 131 mmol/L — ABNORMAL LOW (ref 135–145)
Total Bilirubin: 0.6 mg/dL (ref 0.3–1.2)
Total Protein: 5.4 g/dL — ABNORMAL LOW (ref 6.5–8.1)

## 2020-06-30 LAB — CBC
HCT: 28 % — ABNORMAL LOW (ref 39.0–52.0)
Hemoglobin: 9.7 g/dL — ABNORMAL LOW (ref 13.0–17.0)
MCH: 30 pg (ref 26.0–34.0)
MCHC: 34.6 g/dL (ref 30.0–36.0)
MCV: 86.7 fL (ref 80.0–100.0)
Platelets: 157 10*3/uL (ref 150–400)
RBC: 3.23 MIL/uL — ABNORMAL LOW (ref 4.22–5.81)
RDW: 12.2 % (ref 11.5–15.5)
WBC: 10.1 10*3/uL (ref 4.0–10.5)
nRBC: 0 % (ref 0.0–0.2)

## 2020-06-30 MED ORDER — LACTULOSE 10 GM/15ML PO SOLN
30.0000 g | Freq: Two times a day (BID) | ORAL | Status: DC
  Administered 2020-06-30 – 2020-07-02 (×3): 30 g via ORAL
  Filled 2020-06-30 (×3): qty 60

## 2020-06-30 MED ORDER — MORPHINE SULFATE (PF) 2 MG/ML IV SOLN: 1.0000 mg | INTRAVENOUS | Status: DC | PRN

## 2020-06-30 MED ORDER — CEFAZOLIN SODIUM-DEXTROSE 2-4 GM/100ML-% IV SOLN
2.0000 g | Freq: Three times a day (TID) | INTRAVENOUS | Status: AC
  Administered 2020-06-30 (×2): 2 g via INTRAVENOUS
  Filled 2020-06-30 (×2): qty 100

## 2020-06-30 MED ORDER — DEXTROSE-NACL 5-0.9 % IV SOLN: INTRAVENOUS | Status: DC

## 2020-06-30 MED ORDER — SODIUM CHLORIDE 0.9 % IV BOLUS
500.0000 mL | Freq: Once | INTRAVENOUS | Status: AC
  Administered 2020-06-30: 500 mL via INTRAVENOUS

## 2020-06-30 MED ORDER — DEXTROSE-NACL 5-0.45 % IV SOLN: INTRAVENOUS | Status: DC

## 2020-06-30 NOTE — Progress Notes (Signed)
PROGRESS NOTE       HPI was taken from Dr. Francine Graven: Dwayne Thompson is a 74 y.o. male with medical history significant for liver cirrhosis, BPH, COPD, hypertension and chronic low back pain who was presented to the ER from the cardiothoracic surgeons office for evaluation of hypotension and tachycardia.  Patient has a history of mesothelioma as well as recurrent left pleural effusions and is status post 2 prior thoracentesis with drainage of serosanguineous fluid.  He was scheduled to see the cardiothoracic surgeon today but states that he developed worsening shortness of breath about 2 days prior to presentation.  He did not come to the emergency room because he wanted to keep his appointment with the CT surgeon for possible biopsy.  When he arrived at the office patient was noted to be hypotensive and tachycardic.  He was also hypoxic with room air pulse oximetry in the 80s.  Patient was placed on oxygen and was transported to the ER by EMS. He has had chest pain mostly over the left anterior chest wall but denies having any nausea, no vomiting, no diaphoresis or palpitations. He denies having any abdominal pain, no changes in his bowel habits, no fever, no chills, no cough, no dizziness or lightheadedness. Labs show sodium 131, potassium 5.5, chloride 100, bicarb 21, glucose 106, BUN 70 compared to 12 about a month ago, creatinine 2.75 compared to 0.85 about a month ago, calcium 8.9, white count 9.8, hemoglobin 9.8, hematocrit 29.1, MCV 90.9, RDW 12.5, platelet count 220 Chest x-ray reviewed by me shows a left pleural effusion.  Otherwise clear lungs Twelve-lead EKG reviewed by me shows sinus tachycardia   ED Course: Patient is a 74 year old African-American male who was referred to the emergency room by the cardiothoracic surgeon for evaluation of hypotension and hypoxia.  Patient had pulse oximetry in the high 80s during his appointment and required oxygen supplementation to maintain pulse  oximetry greater than 92%.  He was also hypotensive and responded to IV fluid resuscitation.  Patient had a chest x-ray which showed a left pleural effusion and status post pigtail insertion with drainage of frank blood.  Patient has had about 2.5 L of bloody drainage from the pigtail.  He continues to require oxygen at 4 L to maintain pulse oximetry greater than 92% and will be admitted to the hospital for further evaluation.  Dwayne Thompson  JME:[id number] DOB: 1946-04-08 DOA: 06/22/2020 PCP: Dwayne Thompson., MD   Assessment & Plan:   Principal Problem:   Acute respiratory failure [facility]) Active Problems:   BPH with obstruction/lower urinary tract symptoms   Thrombocytopenia ([place])   Hypertension   Cirrhosis ([place])   Hemothorax on left   Pleural mass   Bone metastasis ([facility])   Pressure injury of skin   Hemothorax   Chest tube in place   Small cell lung cancer: stage IV. Chemo is only palliative as per onco. Oncology following and recs apprec. Hx of 50 pack year, quit smoking in 2015.   Left hemothorax: secondary to small cell lung cancer. S/p chest tube placement. Pt denies any trauma. S/p thoracoscopy w/ limited decortication of the lung, insert of pleurx cath left pleural space & insertion of left subclavian port-a-cath as per gen surg 06/29/20. Still w/ significant amount of pain. Fentanyl, oxycodone prn for pain.  Gen surg recs apprec   Acute hypoxic respiratory failure: likely secondary to hemothorax. S/p chest tube placement. Placed back on oxygen when SaO2 showed 89%.  Hyperkalemia: resolved   AKI: likely pre-renal. Resolved   GERD: continue on pantoprazole    BPH: continue on home dose of dutasteride. Continue to hold home dose of tamsulosin secondary to hypotension   Cirrhosis: not on any meds at home for this. Started lactulose for constipation.  Stable. Will need to f/u outpatient w/ GI. Pt verbalized his understanding  Hyponatremia: likely secondary to cirrhosis.  Labile. Will continue to monitor   Constipation: continue on miralax. Started on lactulose    ACD: will transfuse if Hb <7.0. Will continue to monitor   Hypotension: intermittently, possibly secondary to pain meds. Continue on midodrine.    DVT prophylaxis: SCDs Code Status: full  Family Communication: discussed pt's care w/ pt's wife at bedside and answered her questions  Disposition Plan: likely d/c home w/ home health   Status is: Inpatient  Remains inpatient appropriate because:Ongoing diagnostic testing needed not appropriate for outpatient work up, IV treatments appropriate due to intensity of illness or inability to take PO and Inpatient level of care appropriate due to severity of illness still has a chest tube present    Dispo: The patient is from: Home              Anticipated d/c is to: Home w/ home health               Anticipated d/c date is: > 3 days              Patient currently is not medically stable to d/c.    Consultants:  Onco IR Gen surg    Procedures:   Antimicrobials:   Subjective: Pt c/o constipation & lateral chest wall.   Objective: Vitals:   06/29/20 1600 06/29/20 2146 06/30/20 0559 06/30/20 1156  BP: 93/63 97/65 (!) 88/54 95/61  Pulse: 95 91 94 95  Resp: 16 16 16 16   Temp: 98.5 F (36.9 C) 99 F (37.2 C) 98.8 F (37.1 C) 98.2 F (36.8 C)  TempSrc: Oral Oral Oral Oral  SpO2: 98% 97% 97% 97%  Weight:      Height:        Intake/Output Summary (Last 24 hours) at 06/30/2020 1227 Last data filed at 06/30/2020 0600 Gross per 24 hour  Intake 625 ml  Output 1870 ml  Net -1245 ml   Filed Weights   06/22/20 1112 06/23/20 0545  Weight: 76.2 kg 86.8 kg    Examination:  General exam: Appears calm but uncomfortable   Respiratory system: diminished breath sounds b/l. No rales  Cardiovascular system: S1/S2+. No rubs or gallops. B/l LE +1 pitting edema  Gastrointestinal system: Abd is soft, non-tender, non-distended & hypoactive  bowel sounds.  Central nervous system: Alert and oriented. Moves all 4 extremities  Psychiatry: Judgement and insight appear normal. Flat mood and affect     Data Reviewed: I have personally reviewed following labs and imaging studies  CBC: Recent Labs  Lab 06/27/20 0429 06/28/20 0507 06/28/20 2249 06/29/20 0442 06/30/20 0641  WBC 8.6 8.8 9.2 8.3 10.1  NEUTROABS  --   --  6.9  --   --   HGB 10.1* 9.8* 9.8* 9.7* 9.7*  HCT 29.4* 28.8* 27.9* 27.5* 28.0*  MCV 89.9 89.2 87.7 87.0 86.7  PLT 186 167 165 170 242   Basic Metabolic Panel: Recent Labs  Lab 06/25/20 0530 06/25/20 0530 06/26/20 0553 06/27/20 0429 06/28/20 0507 06/28/20 2249 06/29/20 0442 06/30/20 0641  NA 134*   < > 134* 133* 132* 130*  --  131*  K 4.5   < > 5.1 4.7 4.2 4.3  --  4.1  CL 104   < > 102 99 98 97*  --  97*  CO2 24   < > 24 27 25 25   --  26  GLUCOSE 94   < > 96 102* 92 133*  --  100*  BUN 13   < > 16 18 14 17   --  12  CREATININE 0.77   < > 0.93 0.88 0.87 0.77  --  0.74  CALCIUM 8.2*   < > 8.3* 8.6* 8.5* 8.3*  --  8.3*  MG 1.6*  --  1.8 1.9 1.7  --  1.7  --   PHOS 3.5  --  4.5 3.8 3.5  --  3.4  --    < > = values in this interval not displayed.   GFR: Estimated Creatinine Clearance: 86.3 mL/min (by C-G formula based on SCr of 0.74 mg/dL). Liver Function Tests: Recent Labs  Lab 06/28/20 2249 06/30/20 0641  AST 46* 36  ALT 34 30  ALKPHOS 52 45  BILITOT 0.4 0.6  PROT 5.6* 5.4*  ALBUMIN 2.5* 2.4*   No results for input(s): LIPASE, AMYLASE in the last 168 hours. No results for input(s): AMMONIA in the last 168 hours. Coagulation Profile: Recent Labs  Lab 06/28/20 2249  INR 1.0   Cardiac Enzymes: No results for input(s): CKTOTAL, CKMB, CKMBINDEX, TROPONINI in the last 168 hours. BNP (last 3 results) No results for input(s): PROBNP in the last 8760 hours. HbA1C: No results for input(s): HGBA1C in the last 72 hours. CBG: Recent Labs  Lab 06/24/20 0737  GLUCAP 86   Lipid  Profile: No results for input(s): CHOL, HDL, LDLCALC, TRIG, CHOLHDL, LDLDIRECT in the last 72 hours. Thyroid Function Tests: No results for input(s): TSH, T4TOTAL, FREET4, T3FREE, THYROIDAB in the last 72 hours. Anemia Panel: No results for input(s): VITAMINB12, FOLATE, FERRITIN, TIBC, IRON, RETICCTPCT in the last 72 hours. Sepsis Labs: No results for input(s): PROCALCITON, LATICACIDVEN in the last 168 hours.  Recent Results (from the past 240 hour(s))  SARS CORONAVIRUS 2 (TAT 6-24 HRS) Nasopharyngeal Nasopharyngeal Swab     Status: None   Collection Time: 06/20/20  3:04 PM   Specimen: Nasopharyngeal Swab  Result Value Ref Range Status   SARS Coronavirus 2 NEGATIVE NEGATIVE Final    Comment: (NOTE) SARS-CoV-2 target nucleic acids are NOT DETECTED.  The SARS-CoV-2 RNA is generally detectable in upper and lower respiratory specimens during the acute phase of infection. Negative results do not preclude SARS-CoV-2 infection, do not rule out co-infections with other pathogens, and should not be used as the sole basis for treatment or other patient management decisions. Negative results must be combined with clinical observations, patient history, and epidemiological information. The expected result is Negative.  Fact Sheet for Patients: SugarRoll.be  Fact Sheet for Healthcare Providers: https://www.woods-mathews.com/  This test is not yet approved or cleared by the [place] FDA and  has been authorized for detection and/or diagnosis of SARS-CoV-2 by FDA under an Emergency Use Authorization (EUA). This EUA will remain  in effect (meaning this test can be used) for the duration of the COVID-19 declaration under Se ction 564(b)(1) of the Act, 21 U.S.C. section 360bbb-3(b)(1), unless the authorization is terminated or revoked sooner.  Performed at [facility], [place] [address] [place], [place] [address]   MRSA PCR Screening      Status: None   Collection Time:  06/23/20  5:46 AM   Specimen: Nasopharyngeal  Result Value Ref Range Status   MRSA by PCR NEGATIVE NEGATIVE Final    Comment:        The GeneXpert MRSA Assay (FDA approved for NASAL specimens only), is one component of a comprehensive MRSA colonization surveillance program. It is not intended to diagnose MRSA infection nor to guide or monitor treatment for MRSA infections. Performed at [facility], [address], [place], [place] [address]          Radiology Studies: MR BRAIN W WO CONTRAST  Result Date: 06/29/2020 CLINICAL DATA:  Initial evaluation for small cell lung cancer, staging. EXAM: MRI HEAD WITHOUT AND WITH CONTRAST TECHNIQUE: Multiplanar, multiecho pulse sequences of the brain and surrounding structures were obtained without and with intravenous contrast. CONTRAST:  7.69mL GADAVIST GADOBUTROL 1 MMOL/ML IV SOLN COMPARISON:  Prior MRI from 03/04/2013. FINDINGS: Brain: Examination degraded by motion artifact. Age-related cerebral atrophy with mild-to-moderate chronic microvascular ischemic disease. No acute intracranial infarct. Gray-white matter differentiation maintained. No encephalomalacia to suggest chronic cortical infarction. No foci of susceptibility artifact to suggest acute or chronic intracranial hemorrhage. No mass lesion, midline shift or mass effect. No hydrocephalus or extra-axial fluid collection. Pituitary gland suprasellar region normal. Midline structures intact. No abnormal enhancement or findings to suggest intracranial metastatic disease. Vascular: Major intracranial vascular flow voids are maintained. Skull and upper cervical spine: Craniocervical junction within normal limits. No focal marrow replacing lesion. Multilevel degenerative spondylolysis noted within the upper cervical spine. No scalp soft tissue abnormality. Sinuses/Orbits: Globes and orbital soft tissues demonstrate no acute finding. Paranasal sinuses are  largely clear. No mastoid effusion. Inner ear structures grossly normal. Other: None. IMPRESSION: 1. No evidence for intracranial metastatic disease. 2. No other acute intracranial abnormality. 3. Mild to moderate chronic microvascular ischemic disease. Electronically Signed   By: Jeannine Boga M.D.   On: 06/29/2020 03:01   MR THORACIC SPINE W WO CONTRAST  Result Date: 06/29/2020 CLINICAL DATA:  Initial evaluation for non-small cell lung cancer, pretreatment staging. Abnormality T11-12. EXAM: MRI THORACIC WITHOUT AND WITH CONTRAST TECHNIQUE: Multiplanar and multiecho pulse sequences of the thoracic spine were obtained without and with intravenous contrast. CONTRAST:  7.33mL GADAVIST GADOBUTROL 1 MMOL/ML IV SOLN COMPARISON:  Prior PET-CT from 06/12/2020. FINDINGS: MRI THORACIC SPINE FINDINGS Alignment: Vertebral bodies normally aligned with preservation of the normal thoracic kyphosis. No listhesis. Vertebrae: Signal intensity within the visualized bone marrow is diffusely heterogeneous. A left paraspinous soft tissue mass is seen along the left anterolateral aspect of the vertebral column extending from approximately T9-10 through T12-L1 (series 20, image 18). Evidence for invasion/involvement of the adjacent T10 and T11 vertebral bodies (series 21, image 14). Tumor partially extends into the left neural foramen at T11-12 (series 40, image 34). No other significant extension into the spinal canal or epidural tumor. A separate 13 mm metastasis seen involving the central/right aspect of the T10 vertebral body (series 39, image 8). Probable additional 13 mm metastatic lesion seen at T1 (series 20, image 9). 1 cm lesion seen within the right pedicle of T6 (series 21, image 6). No associated pathologic fracture. Cord:  Normal signal and morphology.  No significant epidural tumor. Paraspinal and other soft tissues: Paraspinous tumor as above. Paraspinous soft tissues demonstrate no other acute finding.  Irregular and partially loculated left pleural effusion with scattered fluid-fluid levels partially visualized at the left lung base. Consolidative changes noted throughout the posterior left lung superiorly. Disc levels: Normal expected multilevel  degenerative disc desiccation seen within the thoracic spine. Mild disc bulging noted at the T7-8 level without significant spinal stenosis. Multilevel facet hypertrophy throughout the lower thoracic spine. No significant stenosis or foraminal encroachment. IMPRESSION: 1. 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 Tumor partially extends into the left neural foramen at T11-12. No other significant extension into the spinal canal or epidural tumor. 2. Additional metastatic lesions involving the T1 and T10 vertebral bodies as well as the right pedicle of T6. No associated pathologic fracture. 3. Heterogeneous and partially loculated left pleural effusion with consolidation signal intensity throughout the visualized left lower lung. Electronically Signed   By: Jeannine Boga M.D.   On: 06/29/2020 03:18   DG Chest Port 1 View  Result Date: 06/29/2020 CLINICAL DATA:  Status post Port-A-Cath placement. EXAM: PORTABLE CHEST 1 VIEW COMPARISON:  June 25, 2020. FINDINGS: Stable cardiomegaly. Interval placement of left subclavian Port-A-Cath with distal tip in expected position of cavoatrial junction. No pneumothorax pleural effusion is noted. Right lung is clear. Left pleural effusion is significantly smaller status post pleural drainage catheter placement. Diffuse left lung opacity is noted concerning for atelectasis or possibly inflammation. Bony thorax is unremarkable. IMPRESSION: Interval placement of left subclavian Port-A-Cath with distal tip in expected position of cavoatrial junction. Left pleural effusion is significantly smaller status post pleural drainage  catheter placement. Electronically Signed   By: Marijo Conception M.D.   On: 06/29/2020 13:30   DG C-Arm 1-60 Min-No Report  Result Date: 06/29/2020 Fluoroscopy was utilized by the requesting physician.  No radiographic interpretation.        Scheduled Meds: . cholecalciferol  1,000 Units Oral Daily  . docusate sodium  100 mg Oral Daily  . dutasteride  0.5 mg Oral Daily  . feeding supplement (ENSURE ENLIVE)  237 mL Oral BID BM  . folic acid  1 mg Oral Daily  . lactulose  30 g Oral BID  . lidocaine  1 patch Transdermal Q24H  . loratadine  10 mg Oral Daily  . midodrine  10 mg Oral TID WC  . montelukast  10 mg Oral Daily  . pantoprazole  20 mg Oral BID  . polyethylene glycol  17 g Oral Daily  . rosuvastatin  10 mg Oral Daily  . cyanocobalamin  1,000 mcg Oral Daily   Continuous Infusions: .  ceFAZolin (ANCEF) IV 2 g (06/30/20 1002)  . dextrose 5 % and 0.45% NaCl 75 mL/hr at 06/30/20 0706     LOS: 8 days    Time spent: 33 mins     Wyvonnia Dusky, MD [facility] Pager [phone]  If 7PM-7AM, please contact night-coverage [email or web address] 06/30/2020, 12:27 PM

## 2020-06-30 NOTE — Progress Notes (Signed)
Daily Progress Note   Patient Name: Dwayne Thompson       Date: 06/30/2020 DOB: [birth year]  Age: 74 y.o. MRN#: [id number] Attending Physician: Wyvonnia Dusky, MD Primary Care Physician: Jerrol Banana., MD Admit Date: 06/22/2020  Reason for Consultation/Follow-up: Establishing goals of care  Subjective: Patient is resting in bed with wife at bedside. Pleurex in place. Patient and family state they are planning on oncologic intervention and are happy to be looking toward discharge home. Recommend palliative to follow outpatient.   Length of Stay: 8  Current Medications: Scheduled Meds:  . cholecalciferol  1,000 Units Oral Daily  . docusate sodium  100 mg Oral Daily  . dutasteride  0.5 mg Oral Daily  . feeding supplement (ENSURE ENLIVE)  237 mL Oral BID BM  . folic acid  1 mg Oral Daily  . lactulose  30 g Oral BID  . lidocaine  1 patch Transdermal Q24H  . loratadine  10 mg Oral Daily  . midodrine  10 mg Oral TID WC  . montelukast  10 mg Oral Daily  . pantoprazole  20 mg Oral BID  . polyethylene glycol  17 g Oral Daily  . rosuvastatin  10 mg Oral Daily  . cyanocobalamin  1,000 mcg Oral Daily    Continuous Infusions: .  ceFAZolin (ANCEF) IV 2 g (06/30/20 1002)  . dextrose 5 % and 0.45% NaCl 75 mL/hr at 06/30/20 0706    PRN Meds: acetaminophen **OR** acetaminophen, fentaNYL (SUBLIMAZE) injection, morphine injection, ondansetron **OR** ondansetron (ZOFRAN) IV, oxyCODONE, phenol  Physical Exam Pulmonary:     Effort: Pulmonary effort is normal.  Neurological:     Mental Status: He is alert.             Vital Signs: BP 95/61 (BP Location: Right Arm)   Pulse 95   Temp 98.2 F (36.8 C) (Oral)   Resp 16   Ht 5\' 11"  (1.803 m)   Wt 86.8 kg   SpO2 97%   BMI 26.69 kg/m   SpO2: SpO2: 97 % O2 Device: O2 Device: Nasal Cannula O2 Flow Rate: O2 Flow Rate (L/min): 2 L/min  Intake/output summary:   Intake/Output Summary (Last 24 hours) at 06/30/2020 1422 Last data filed at 06/30/2020 0600 Gross per 24 hour  Intake 225 ml  Output 1420  ml  Net -1195 ml   LBM: Last BM Date: 06/22/20 Baseline Weight: Weight: 76.2 kg Most recent weight: Weight: 86.8 kg       Palliative Assessment/Data:    Flowsheet Rows     Most Recent Value  Intake Tab  Referral Department Hospitalist  Unit at Time of Referral ER  Palliative Care Primary Diagnosis Pulmonary  Date Notified 06/22/20  Palliative Care Type New Palliative care  Reason for referral Clarify Goals of Care  Date of Admission 06/22/20  Date first seen by Palliative Care 06/23/20  # of days Palliative referral response time 1 Day(s)  # of days IP prior to Palliative referral 0  Clinical Assessment  Psychosocial & Spiritual Assessment  Palliative Care Outcomes      Patient Active Problem List   Diagnosis Date Noted  . Small cell lung cancer ([place]) 06/27/2020  . Chest tube in place   . Pressure injury of skin 06/23/2020  . Pleural mass   . Bone metastasis ([place])   . Hemothorax   . Acute respiratory failure ([place]) 06/22/2020  . Cirrhosis ([place])   . Hemothorax on left   . Left flank pain 04/23/2020  . Hypertension 03/12/2019  . Thoracic aortic aneurysm ([place]) 03/12/2019  . Abnormal chest CT 02/13/2019  . Difficulty walking 12/06/2018  . Muscle weakness 12/06/2018  . Cellulitis of lower leg 09/17/2018  . CLE (columnar lined esophagus)   . Special screening for malignant neoplasms, colon   . Benign neoplasm of ascending colon   . Polyp of sigmoid colon   . Diverticulosis of large intestine without diverticulitis   . Iron deficiency anemia due to chronic blood loss 02/18/2018  . Folate deficiency 02/14/2018  . B12 deficiency 01/10/2018  . Folic acid deficiency 33/54/5625  . AVM (arteriovenous  malformation) of small bowel, acquired 01/10/2018  . Helicobacter pylori gastritis (chronic gastritis) 01/10/2018  . Thrombocytopenia ([place]) 01/10/2018  . Internal hemorrhoids   . Acute gastrointestinal hemorrhage   . Stomach irritation   . Duodenal nodule   . Melena 01/01/2018  . Neck pain on right side 03/23/2016  . History of tobacco abuse 03/23/2016  . Cervical lymphadenitis 03/23/2016  . 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 Allergic rhinitis 03/28/2015  . 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 Failure of erection 03/28/2015  . Acid reflux 03/28/2015  . Arthritis, degenerative 03/28/2015  . Change in blood platelet count 03/28/2015  . Temporary cerebral vascular dysfunction 03/28/2015  . Personal history of tobacco use, presenting hazards to health 03/28/2015    Palliative Care Assessment & Plan    Recommendations/Plan: Palliative at D/C.    Code Status:    Code Status Orders  (From admission, onward)         Start     Ordered   06/22/20 1659  Full code  Continuous        06/22/20 1706        Code Status History    Date Active Date Inactive Code Status Order ID Comments User Context   01/01/2018 1205 01/04/2018 1854 Full Code [id number]  Hillary Bow, MD ED   Advance Care Planning Activity      Prognosis:  < 6 months Palliative oncology     Thank you for allowing the Palliative Medicine Team to assist in the care of this patient.   Total Time 15 min Prolonged Time Billed  no      Greater than 50%  of this time was spent counseling and coordinating care related to the above assessment and plan.  Asencion Gowda, NP  Please contact Palliative Medicine Team phone at [phone] for questions and concerns.

## 2020-06-30 NOTE — Progress Notes (Signed)
   06/30/20 1300  Clinical Encounter Type  Visited With Patient and family together  Visit Type Follow-up  Referral From Chaplain  Consult/Referral To Chaplain  Chaplain checked in on Pt. When chaplain entered the room, Pt was eating and his wife was sitting at bedside. Pt's wife said that he is scheduled to go ome tomorrow. Chaplain said "there is no place like home." Pt said that's right. Pt said he feels better. Chaplain asked if she could pray with them and they said said yes. Chaplain prayed with and wished them well.

## 2020-07-01 DIAGNOSIS — C3490 Malignant neoplasm of unspecified part of unspecified bronchus or lung: Secondary | ICD-10-CM | POA: Diagnosis not present

## 2020-07-01 DIAGNOSIS — J9621 Acute and chronic respiratory failure with hypoxia: Secondary | ICD-10-CM

## 2020-07-01 DIAGNOSIS — J942 Hemothorax: Secondary | ICD-10-CM | POA: Diagnosis not present

## 2020-07-01 DIAGNOSIS — I959 Hypotension, unspecified: Secondary | ICD-10-CM | POA: Diagnosis not present

## 2020-07-01 DIAGNOSIS — L89312 Pressure ulcer of right buttock, stage 2: Secondary | ICD-10-CM

## 2020-07-01 LAB — CBC
HCT: 29.5 % — ABNORMAL LOW (ref 39.0–52.0)
Hemoglobin: 10 g/dL — ABNORMAL LOW (ref 13.0–17.0)
MCH: 29.9 pg (ref 26.0–34.0)
MCHC: 33.9 g/dL (ref 30.0–36.0)
MCV: 88.3 fL (ref 80.0–100.0)
Platelets: 177 10*3/uL (ref 150–400)
RBC: 3.34 MIL/uL — ABNORMAL LOW (ref 4.22–5.81)
RDW: 12.4 % (ref 11.5–15.5)
WBC: 10.9 10*3/uL — ABNORMAL HIGH (ref 4.0–10.5)
nRBC: 0 % (ref 0.0–0.2)

## 2020-07-01 LAB — COMPREHENSIVE METABOLIC PANEL
ALT: 52 U/L — ABNORMAL HIGH (ref 0–44)
AST: 65 U/L — ABNORMAL HIGH (ref 15–41)
Albumin: 2.6 g/dL — ABNORMAL LOW (ref 3.5–5.0)
Alkaline Phosphatase: 50 U/L (ref 38–126)
Anion gap: 10 (ref 5–15)
BUN: 11 mg/dL (ref 8–23)
CO2: 25 mmol/L (ref 22–32)
Calcium: 8.3 mg/dL — ABNORMAL LOW (ref 8.9–10.3)
Chloride: 98 mmol/L (ref 98–111)
Creatinine, Ser: 0.82 mg/dL (ref 0.61–1.24)
GFR, Estimated: >60 mL/min (ref >60)
Glucose, Bld: 99 mg/dL (ref 70–99)
Potassium: 4.1 mmol/L (ref 3.5–5.1)
Sodium: 133 mmol/L — ABNORMAL LOW (ref 135–145)
Total Bilirubin: 0.6 mg/dL (ref 0.3–1.2)
Total Protein: 6.1 g/dL — ABNORMAL LOW (ref 6.5–8.1)

## 2020-07-01 NOTE — Progress Notes (Addendum)
SATURATION QUALIFICATIONS: (This note is used to comply with regulatory documentation for home oxygen)  Patient Saturations on Room Air at Rest = 96 %  Patient Saturations on Room Air while Ambulating = 87%  Patient Saturations on 2 Liters of oxygen while Ambulating = 93 %  Please briefly explain why patient needs home oxygen: 

## 2020-07-01 NOTE — Progress Notes (Signed)
Patient ID: Dwayne Thompson, male   DOB: 1946-08-18, 74 y.o.   MRN: [id number] [facility] PROGRESS NOTE  Dwayne Thompson WCH:[id number] DOB: 06-28-1946 DOA: 06/22/2020 PCP: Jerrol Banana., MD  HPI/Subjective: Patient had his procedure yesterday and having some soreness in the left chest.  When walked around he dropped his oxygen saturations this morning and again this afternoon.  Blood pressure on the lower side.  Objective: Vitals:   07/01/20 1304 07/01/20 1629  BP: (!) 91/58 (!) 98/54  Pulse: 89 94  Resp: 18 20  Temp:  98.6 F (37 C)  SpO2: 98% 98%    Intake/Output Summary (Last 24 hours) at 07/01/2020 1639 Last data filed at 07/01/2020 1358 Gross per 24 hour  Intake 523.23 ml  Output 850 ml  Net -326.77 ml   Filed Weights   06/22/20 1112 06/23/20 0545  Weight: 76.2 kg 86.8 kg    ROS: Review of Systems  Respiratory: Positive for shortness of breath. Negative for cough.   Cardiovascular: Positive for chest pain.  Gastrointestinal: Negative for abdominal pain, nausea and vomiting.   Exam: Physical Exam HENT:     Head: Normocephalic.     Nose: No mucosal edema.     Mouth/Throat:     Pharynx: No oropharyngeal exudate.  Eyes:     General: Lids are normal.     Conjunctiva/sclera: Conjunctivae normal.     Pupils: Pupils are equal, round, and reactive to light.  Cardiovascular:     Rate and Rhythm: Normal rate and regular rhythm.     Heart sounds: Normal heart sounds, S1 normal and S2 normal.  Pulmonary:     Breath sounds: Examination of the right-lower field reveals decreased breath sounds. Examination of the left-lower field reveals decreased breath sounds. Decreased breath sounds present. No wheezing, rhonchi or rales.  Abdominal:     Palpations: Abdomen is soft.     Tenderness: There is no abdominal tenderness.  Musculoskeletal:     Right ankle: No swelling.     Left ankle: No swelling.  Skin:    General: Skin is warm.     Comments: 2 stage II decubiti  on buttock  Neurological:     Mental Status: He is alert and oriented to person, place, and time.       Data Reviewed: Basic Metabolic Panel: Recent Labs  Lab 06/25/20 0530 06/25/20 0530 06/26/20 3536 06/26/20 1443 06/27/20 0429 06/28/20 0507 06/28/20 2249 06/29/20 0442 06/30/20 0641 07/01/20 0444  NA 134*   < > 134*   < > 133* 132* 130*  --  131* 133*  K 4.5   < > 5.1   < > 4.7 4.2 4.3  --  4.1 4.1  CL 104   < > 102   < > 99 98 97*  --  97* 98  CO2 24   < > 24   < > 27 25 25   --  26 25  GLUCOSE 94   < > 96   < > 102* 92 133*  --  100* 99  BUN 13   < > 16   < > 18 14 17   --  12 11  CREATININE 0.77   < > 0.93   < > 0.88 0.87 0.77  --  0.74 0.82  CALCIUM 8.2*   < > 8.3*   < > 8.6* 8.5* 8.3*  --  8.3* 8.3*  MG 1.6*  --  1.8  --  1.9 1.7  --  1.7  --   --   PHOS 3.5  --  4.5  --  3.8 3.5  --  3.4  --   --    < > = values in this interval not displayed.   Liver Function Tests: Recent Labs  Lab 06/28/20 2249 06/30/20 0641 07/01/20 0444  AST 46* 36 65*  ALT 34 30 52*  ALKPHOS 52 45 50  BILITOT 0.4 0.6 0.6  PROT 5.6* 5.4* 6.1*  ALBUMIN 2.5* 2.4* 2.6*   CBC: Recent Labs  Lab 06/28/20 0507 06/28/20 2249 06/29/20 0442 06/30/20 0641 07/01/20 0444  WBC 8.8 9.2 8.3 10.1 10.9*  NEUTROABS  --  6.9  --   --   --   HGB 9.8* 9.8* 9.7* 9.7* 10.0*  HCT 28.8* 27.9* 27.5* 28.0* 29.5*  MCV 89.2 87.7 87.0 86.7 88.3  PLT 167 165 170 157 177     Recent Results (from the past 240 hour(s))  MRSA PCR Screening     Status: None   Collection Time: 06/23/20  5:46 AM   Specimen: Nasopharyngeal  Result Value Ref Range Status   MRSA by PCR NEGATIVE NEGATIVE Final    Comment:        The GeneXpert MRSA Assay (FDA approved for NASAL specimens only), is one component of a comprehensive MRSA colonization surveillance program. It is not intended to diagnose MRSA infection nor to guide or monitor treatment for MRSA infections. Performed at [facility], [address] [place], [place] [address]      Scheduled Meds: . cholecalciferol  1,000 Units Oral Daily  . docusate sodium  100 mg Oral Daily  . dutasteride  0.5 mg Oral Daily  . feeding supplement  237 mL Oral BID BM  . folic acid  1 mg Oral Daily  . lactulose  30 g Oral BID  . lidocaine  1 patch Transdermal Q24H  . loratadine  10 mg Oral Daily  . midodrine  10 mg Oral TID WC  . montelukast  10 mg Oral Daily  . pantoprazole  20 mg Oral BID  . polyethylene glycol  17 g Oral Daily  . rosuvastatin  10 mg Oral Daily  . cyanocobalamin  1,000 mcg Oral Daily   Continuous Infusions:  Assessment/Plan:  1. Stage IV small cell lung cancer.  Patient will follow up with outpatient oncology for palliative chemotherapy. 2. Left hemithorax status post thoracostomy with decortication.  Patient does have a Pleurx catheter.  Chest tube removed yesterday.  Nursing to teach wife how to drain the catheter. 3. Acute now chronic hypoxic respiratory failure.  Pulse ox with ambulation 87%.  Will likely require oxygen upon going home. 4. Acute kidney injury this has resolved 5. Stage II right buttock decubiti present admission.  See description below. 6. Hypotension on midodrine 7. BPH on Proscar 8. 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 Hyponatremia secondary to either small cell lung cancer or cirrhosis 10. Anemia of chronic disease 11. Weakness.  Physical therapy evaluation  Pressure Injury 06/23/20 Buttocks Right Stage 2 -  Partial thickness loss of dermis presenting as a shallow open injury with a red, pink wound bed without slough. 2.5 x 1.2 (Active)  06/23/20 0608  Location: Buttocks  Location Orientation: Right  Staging: Stage 2 -  Partial thickness loss of dermis presenting as a shallow open injury with a red, pink wound bed without slough.  Wound Description (Comments): 2.5 x 1.2  Present on Admission: Yes  Code Status:     Code Status Orders  (From admission, onward)         Start      Ordered   06/22/20 1659  Full code  Continuous        06/22/20 1706        Code Status History    Date Active Date Inactive Code Status Order ID Comments User Context   01/01/2018 1205 01/04/2018 1854 Full Code [id number]  Hillary Bow, MD ED   Ad[facility] Care Planning Activity     Family Communication: Wife at the bedside Disposition Plan: Status is: Inpatient  Dispo: The patient is from: Home              Anticipated d/c is to: Home              Anticipated d/c date is: 07/02/2020              Patient currently qualifies for oxygen to go home with.  Blood pressure still low despite giving midodrine.  Patient did not eat very well today.  Patient has not ambulated very much.  Nursing staff teaching wife how to remove fluid from the drain.  Time spent: 28 minutes  [place]

## 2020-07-01 NOTE — TOC Progression Note (Addendum)
Transition of Care [facility]) - Progression Note    Patient Details  Name: Dwayne Thompson MRN: [id number] Date of Birth: [birth year]  Transition of Care [facility]) CM/SW Contact  Izola Price, RN Phone Number:  07/01/2020, 9:48 AM  Clinical Narrative:    10/13 1530: 10/13 1530. So far no HH agency will accept UHC/Medicare and the PleurX even for the [facility] PT order only. [facility] said they may could by Monday, but have staff out on quarantine. Cost of PleurX is the common issue even if nursing care is not ordered. Simmie Davies RN CM   10/13 1315 Contacted [facility], pending response. [facility] can't take any UHC till Monday. Simmie Davies RN CM   10/13 Spoke with patient and spouse present in room, pt is okay with [facility], did not have a preference though list of agencies was provided.  Has a Civil engineer, contracting and a cane at home. Needs RW per PT rec. Pleurex in place, likely will discharge with this. PT 35 feet min assist. Walking saturation done by RN, see notes. Per RN pt.will need home oxygen. Simmie Davies RN CM     Expected Discharge Plan: Home/Self Care Barriers to Discharge: Continued Medical Work up  Expected Discharge Plan and Services Expected Discharge Plan: Home/Self Care       Living arrangements for the past 2 months: Single Family Home                                       Social Determinants of Health (SDOH) Interventions    Readmission Risk Interventions No flowsheet data found.

## 2020-07-01 NOTE — Progress Notes (Signed)
Daily Progress Note   Patient Name: Dwayne Thompson       Date: 07/01/2020 DOB: [birth year]  Age: 74 y.o. MRN#: [id number] Attending Physician: Loletha Grayer, MD Primary Care Physician: Jerrol Banana., MD Admit Date: 06/22/2020  Reason for Consultation/Follow-up: Establishing goals of care  Subjective: Patient is resting in bed with wife at bedside. Wife is working to learn to drain his pleurex. We discussed his cancer diagnosis and plans for palliative chemo. He states he wants to go home, and is disappointed he is not ready for D/C today. Per staff, his oral intake is subpar. He is unsure if he would want a feeding tube. He does not believe he would ever want to be placed on a ventilator, but is unsure about CPR. They state they will discuss these decisions. They are aware a living will can be completed during admission. MOST form provided for them to look over.  Recommend outpatient palliative.    Length of Stay: 9  Current Medications: Scheduled Meds:  . cholecalciferol  1,000 Units Oral Daily  . docusate sodium  100 mg Oral Daily  . dutasteride  0.5 mg Oral Daily  . feeding supplement  237 mL Oral BID BM  . folic acid  1 mg Oral Daily  . lactulose  30 g Oral BID  . lidocaine  1 patch Transdermal Q24H  . loratadine  10 mg Oral Daily  . midodrine  10 mg Oral TID WC  . montelukast  10 mg Oral Daily  . pantoprazole  20 mg Oral BID  . polyethylene glycol  17 g Oral Daily  . rosuvastatin  10 mg Oral Daily  . cyanocobalamin  1,000 mcg Oral Daily    Continuous Infusions: . dextrose 5 % and 0.9% NaCl 75 mL/hr at 06/30/20 2220    PRN Meds: acetaminophen **OR** acetaminophen, fentaNYL (SUBLIMAZE) injection, morphine injection, ondansetron **OR** ondansetron (ZOFRAN) IV,  oxyCODONE, phenol  Physical Exam Pulmonary:     Effort: Pulmonary effort is normal.  Neurological:     Mental Status: He is alert.             Vital Signs: BP (!) 88/58 (BP Location: Left Arm)   Pulse 91   Temp 98.9 F (37.2 C) (Oral)   Resp 17   Ht 5\' 11"  (1.803 m)  Wt 86.8 kg   SpO2 94%   BMI 26.69 kg/m  SpO2: SpO2: 94 % O2 Device: O2 Device: Room Air O2 Flow Rate: O2 Flow Rate (L/min): 2 L/min  Intake/output summary:   Intake/Output Summary (Last 24 hours) at 07/01/2020 1300 Last data filed at 07/01/2020 1230 Gross per 24 hour  Intake 403.23 ml  Output 850 ml  Net -446.77 ml   LBM: Last BM Date: 06/30/20 Baseline Weight: Weight: 76.2 kg Most recent weight: Weight: 86.8 kg       Palliative Assessment/Data: 50%     Flowsheet Rows     Most Recent Value  Intake Tab  Referral Department Hospitalist  Unit at Time of Referral ER  Palliative Care Primary Diagnosis Pulmonary  Date Notified 06/22/20  Palliative Care Type New Palliative care  Reason for referral Clarify Goals of Care  Date of Admission 06/22/20  Date first seen by Palliative Care 06/23/20  # of days Palliative referral response time 1 Day(s)  # of days IP prior to Palliative referral 0  Clinical Assessment  Psychosocial & Spiritual Assessment  Palliative Care Outcomes      Patient Active Problem List   Diagnosis Date Noted  . Small cell lung cancer ([place]) 06/27/2020  . Chest tube in place   . Pressure injury of skin 06/23/2020  . Pleural mass   . Bone metastasis ([place])   . Hemothorax   . Acute respiratory failure ([place]) 06/22/2020  . Cirrhosis ([place])   . Hemothorax on left   . Left flank pain 04/23/2020  . Hypertension 03/12/2019  . Thoracic aortic aneurysm ([place]) 03/12/2019  . Abnormal chest CT 02/13/2019  . Difficulty walking 12/06/2018  . Muscle weakness 12/06/2018  . Cellulitis of lower leg 09/17/2018  . CLE (columnar lined esophagus)   . Special screening for malignant  neoplasms, colon   . Benign neoplasm of ascending colon   . Polyp of sigmoid colon   . Diverticulosis of large intestine without diverticulitis   . Iron deficiency anemia due to chronic blood loss 02/18/2018  . Folate deficiency 02/14/2018  . B12 deficiency 01/10/2018  . Folic acid deficiency 89/38/1017  . 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 Helicobacter pylori gastritis (chronic gastritis) 01/10/2018  . Thrombocytopenia ([place]) 01/10/2018  . Internal hemorrhoids   . Acute gastrointestinal hemorrhage   . Stomach irritation   . Duodenal nodule   . Melena 01/01/2018  . Neck pain on right side 03/23/2016  . History of tobacco abuse 03/23/2016  . Cervical lymphadenitis 03/23/2016  . 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 Allergic rhinitis 03/28/2015  . 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 Failure of erection 03/28/2015  . Acid reflux 03/28/2015  . Arthritis, degenerative 03/28/2015  . Change in blood platelet count 03/28/2015  . Temporary cerebral vascular dysfunction 03/28/2015  . Personal history of tobacco use, presenting hazards to health 03/28/2015    Palliative Care Assessment & Plan    Recommendations/Plan: Palliative at D/C.    Code Status:    Code Status Orders  (From admission, onward)         Start     Ordered   06/22/20 1659  Full code  Continuous        06/22/20 1706        Code Status History    Date Active Date Inactive Code Status Order ID Comments  User Context   01/01/2018 1205 01/04/2018 1854 Full Code [id number]  Hillary Bow, MD ED   Advance Care Planning Activity      Prognosis:  < 6 months Palliative oncology     Thank you for allowing the Palliative Medicine Team to assist in the care of this patient.   Total Time 12:20-1:00 Prolonged Time Billed  no       Greater than 50%  of this time was spent counseling and coordinating care related to the above assessment and plan.  Asencion Gowda, NP  Please contact Palliative Medicine Team phone at [phone] for questions and concerns.

## 2020-07-01 NOTE — Progress Notes (Addendum)
Pleural fluids drained today about 250 mL with wife doing 50% of the task. Recommending for wife to do another draining tomorrow with her doing 100% hands-on. Wife agreed with the plan.

## 2020-07-02 ENCOUNTER — Other Ambulatory Visit: Payer: Medicare Other

## 2020-07-02 ENCOUNTER — Other Ambulatory Visit: Payer: Self-pay

## 2020-07-02 DIAGNOSIS — C3490 Malignant neoplasm of unspecified part of unspecified bronchus or lung: Secondary | ICD-10-CM

## 2020-07-02 DIAGNOSIS — N179 Acute kidney failure, unspecified: Secondary | ICD-10-CM | POA: Diagnosis not present

## 2020-07-02 DIAGNOSIS — J942 Hemothorax: Secondary | ICD-10-CM | POA: Diagnosis not present

## 2020-07-02 DIAGNOSIS — J9621 Acute and chronic respiratory failure with hypoxia: Secondary | ICD-10-CM | POA: Diagnosis not present

## 2020-07-02 DIAGNOSIS — Z23 Encounter for immunization: Secondary | ICD-10-CM | POA: Diagnosis present

## 2020-07-02 MED ORDER — ENSURE ENLIVE PO LIQD: 237.0000 mL | Freq: Two times a day (BID) | ORAL | 0 refills | Status: AC

## 2020-07-02 MED ORDER — MIDODRINE HCL 10 MG PO TABS
10.0000 mg | ORAL_TABLET | Freq: Four times a day (QID) | ORAL | 0 refills | Status: DC
Start: 2020-07-02 — End: 2020-08-06

## 2020-07-02 MED ORDER — LIDOCAINE 5 % EX PTCH: 1.0000 | MEDICATED_PATCH | CUTANEOUS | 0 refills | Status: AC

## 2020-07-02 MED ORDER — POLYETHYLENE GLYCOL 3350 17 G PO PACK: 17.0000 g | PACK | Freq: Every day | ORAL | 0 refills | Status: AC | PRN

## 2020-07-02 MED ORDER — LIDOCAINE 4 % EX PTCH: 1.0000 | MEDICATED_PATCH | Freq: Every morning | CUTANEOUS | 0 refills | Status: AC

## 2020-07-02 MED ORDER — LIDOCAINE-PRILOCAINE 2.5-2.5 % EX CREA: 1.0000 "application " | TOPICAL_CREAM | CUTANEOUS | 0 refills | Status: DC | PRN

## 2020-07-02 MED ORDER — LACTULOSE 10 GM/15ML PO SOLN: 30.0000 g | Freq: Two times a day (BID) | ORAL | 0 refills | Status: AC

## 2020-07-02 NOTE — Discharge Summary (Signed)
[facility] NAME: Dwayne Thompson    MR#:  [id number]  DATE OF BIRTH:  11-19-1945  DATE OF ADMISSION:  06/22/2020 ADMITTING PHYSICIAN: Collier Bullock, MD  DATE OF DISCHARGE: 07/03/2020  PRIMARY CARE PHYSICIAN: Jerrol Banana., MD    ADMISSION DIAGNOSIS:  Acute respiratory failure (HCC) [J96.00] Shortness of breath [R06.02] Other chest pain [R07.89] Hemothorax [J94.2] Postop check [Z09] Hypoxia [R09.02] AKI (acute kidney injury) ([place]) [N17.9] Pleural mass [J94.8]  DISCHARGE DIAGNOSIS:  Principal Problem:   Acute on chronic respiratory failure with hypoxia (HCC) Active Problems:   BPH with obstruction/lower urinary tract symptoms   Thrombocytopenia (HCC)   Hypertension   Cirrhosis ([place])   Hemothorax on left   Pleural mass   Bone metastasis (HCC)   Pressure injury of skin   Hemothorax   Chest tube in place   Hypotension   SECONDARY DIAGNOSIS:   Past Medical History:  Diagnosis Date  . Anemia   . Back pain   . BPH (benign prostatic hyperplasia)   . Cirrhosis ([place])   . COPD (chronic obstructive pulmonary disease) ([place])   . ED (erectile dysfunction)   . GERD (gastroesophageal reflux disease)   . Hyperlipidemia   . Hypertension     HOSPITAL COURSE:   1. Stage IV small cell lung cancer. Patient will follow up Monday with outpatient oncology for palliative chemotherapy. Explained that palliative chemotherapy will hopefully slowed things down but will not be curative. Need to see how he tolerates chemotherapy. 2. Left hemothorax status post thoracotomy with decortication. Patient does have a Pleurx catheter. Chest tube removed 2 days ago. Nursing staff to teach family how to drain the catheter. Unfortunately transitional care team unable to follow-up with a home health agency at this point with the Pleurx catheter. Transitional care team able to try to set up supplies for the Pleurx catheter. Follow-up with Dr. Genevive Bi  as outpatient. 3. Acute on chronic hypoxic respiratory failure. Pulse ox did drop down below 87% with ambulation. Qualifies for oxygen at home. 4. Acute kidney injury. His creatinine 2.75 when he came in. Normalized at 0.82. 5. Hypotension. All antihypertensive medications were stopped. Patient on midodrine 10 mg 4 times daily. Patient feels okay walking around even though his blood pressure can be as low as systolic 68T at times. 6. BPH on Proscar 7. Cirrhosis of liver. Continue lactulose Eight. Hyponatremia. Either secondary to small cell lung cancer or cirrhosis 9. Anemia of chronic disease 10. Weakness. Physical therapy recommended home health PT but with difficulty finding a home health agency that will take him with a Pleurx catheter will have to do outpatient PT. 11. Stage II sacral decubitus, present on admission with partial thickness and loss of dermis presenting as a shallow open injury with red, pink wound bed without slough. Foam pad and changing positions as treatment to prevent worsening. 12. Palliative care followed in the hospital and should continue to follow as outpatient also.   DISCHARGE CONDITIONS:   Fair  CONSULTS OBTAINED:  Treatment Team:  [name], MD Lequita Asal, MD  DRUG ALLERGIES:   Allergies  Allergen Reactions  . Indomethacin Swelling    ankle swelling  . Tamsulosin Hcl Hives  . Sulfa Antibiotics Rash    DISCHARGE MEDICATIONS:   Allergies as of 07/02/2020      Reactions   Indomethacin Swelling   ankle swelling   Tamsulosin Hcl Hives   Sulfa Antibiotics Rash  Medication List    STOP taking these medications   AMBULATORY NON FORMULARY MEDICATION   aspirin 81 MG chewable tablet   doxazosin 2 MG tablet Commonly known as: Cardura   HYDROcodone-acetaminophen 10-325 MG tablet Commonly known as: Norco   tamsulosin 0.4 MG Caps capsule Commonly known as: FLOMAX     TAKE these medications   acetaminophen 325 MG  tablet Commonly known as: TYLENOL Take 2 tablets (650 mg total) by mouth 3 (three) times daily. What changed:  when to take this reasons to take this   cetirizine 10 MG tablet Commonly known as: ZYRTEC Take 1 tablet (10 mg total) by mouth daily.   cholecalciferol 25 MCG (1000 UNIT) tablet Commonly known as: VITAMIN D3 Take 1,000 Units by mouth daily.   cyanocobalamin 1000 MCG tablet Take 1 tablet (1,000 mcg total) by mouth daily.   diclofenac Sodium 1 % Gel Commonly known as: VOLTAREN Apply 1 application topically 4 (four) times daily as needed (pain).   dutasteride 0.5 MG capsule Commonly known as: AVODART Take 0.5 mg by mouth daily.   feeding supplement Liqd Take 237 mLs by mouth 2 (two) times daily between meals.   folic acid 1 MG tablet Commonly known as: FOLVITE Take 1 tablet (1 mg total) by mouth daily.   lactulose 10 GM/15ML solution Commonly known as: CHRONULAC Take 45 mLs (30 g total) by mouth 2 (two) times daily.   lidocaine 5 % Commonly known as: LIDODERM Place 1 patch onto the skin daily. Remove & Discard patch within 12 hours or as directed by MD   Lidocaine 4 % Ptch Commonly known as: Salonpas Pain Relieving Apply 1 patch topically every morning.   midodrine 10 MG tablet Commonly known as: PROAMATINE Take 1 tablet (10 mg total) by mouth 4 (four) times daily.   montelukast 10 MG tablet Commonly known as: SINGULAIR TAKE 1 TABLET BY MOUTH EVERY DAY   oxyCODONE 5 MG immediate release tablet Commonly known as: Roxicodone Take 1 tablet (5 mg total) by mouth every 4 (four) hours as needed for severe pain. Notes to patient: Last dose given today at 6:25am   pantoprazole 20 MG tablet Commonly known as: PROTONIX Take 20 mg by mouth 2 (two) times daily.   polyethylene glycol 17 g packet Commonly known as: MIRALAX / GLYCOLAX Take 17 g by mouth daily as needed for moderate constipation.   potassium chloride 10 MEQ tablet Commonly known as:  KLOR-CON Take 1 tablet (10 mEq total) by mouth 2 (two) times daily.   rosuvastatin 10 MG tablet Commonly known as: Crestor Take 1 tablet (10 mg total) by mouth daily.            Durable Medical Equipment  (From admission, onward)         Start     Ordered   07/02/20 0858  For home use only DME Walker rolling  Once       Question Answer Comment  Walker: With Electra   Patient needs a walker to treat with the following condition Unsteady gait when walking      07/02/20 0858   07/01/20 1645  For home use only DME oxygen  Once       Question Answer Comment  Length of Need Lifetime   Mode or (Route) Nasal cannula   Liters per Minute 2   Frequency Continuous (stationary and portable oxygen unit needed)   Oxygen conserving device Yes   Oxygen delivery system Gas  07/01/20 1645           DISCHARGE INSTRUCTIONS:   Follow-up Dr. Mike Gip oncology on Monday for chemotherapy Follow-up Dr. Genevive Bi 1 week Follow-up PMD  If you experience worsening of your admission symptoms, develop shortness of breath, life threatening emergency, suicidal or homicidal thoughts you must seek medical attention immediately by calling 911 or calling your MD immediately  if symptoms less severe.  You Must read complete instructions/literature along with all the possible adverse reactions/side effects for all the Medicines you take and that have been prescribed to you. Take any new Medicines after you have completely understood and accept all the possible adverse reactions/side effects.   Please note  You were cared for by a hospitalist during your hospital stay. If you have any questions about your discharge medications or the care you received while you were in the hospital after you are discharged, you can call the unit and asked to speak with the hospitalist on call if the hospitalist that took care of you is not available. Once you are discharged, your primary care physician will handle  any further medical issues. Please note that NO REFILLS for any discharge medications will be authorized once you are discharged, as it is imperative that you return to your primary care physician (or establish a relationship with a primary care physician if you do not have one) for your aftercare needs so that they can reassess your need for medications and monitor your lab values.    Today   CHIEF COMPLAINT:   Chief Complaint  Patient presents with  . Shortness of Breath  . Chest Pain    HISTORY OF PRESENT ILLNESS:  Dwayne Thompson  is a 74 y.o. male came in with shortness of breath and chest pain   VITAL SIGNS:  Blood pressure (!) 100/58, pulse 97, temperature 97.9 F (36.6 C), temperature source Oral, resp. rate 20, height 5\' 11"  (1.803 m), weight 86.8 kg, SpO2 94 %.   PHYSICAL EXAMINATION:  GENERAL:  74 y.o.-year-old patient lying in the bed with no acute distress.  EYES: Pupils equal, round, reactive to light and accommodation. No scleral icterus.  HEENT: Head atraumatic, normocephalic. Oropharynx and nasopharynx clear.  LUNGS: Decreased breath sounds left base, no wheezing, rales,rhonchi or crepitation. No use of accessory muscles of respiration.  CARDIOVASCULAR: S1, S2 normal. No murmurs, rubs, or gallops.  ABDOMEN: Soft, non-tender.  EXTREMITIES: Trace pedal edema.  NEUROLOGIC: Cranial nerves II through XII are intact. Muscle strength 5/5 in all extremities. Sensation intact. Gait not checked.  PSYCHIATRIC: The patient is alert and oriented x 3.  SKIN: Stage II sacral decubitus  DATA REVIEW:   CBC Recent Labs  Lab 07/01/20 0444  WBC 10.9*  HGB 10.0*  HCT 29.5*  PLT 177    Chemistries  Recent Labs  Lab 06/29/20 0442 06/30/20 0641 07/01/20 0444  NA  --    < > 133*  K  --    < > 4.1  CL  --    < > 98  CO2  --    < > 25  GLUCOSE  --    < > 99  BUN  --    < > 11  CREATININE  --    < > 0.82  CALCIUM  --    < > 8.3*  MG 1.7  --   --   AST  --    < > 65*   ALT  --    < >  52*  ALKPHOS  --    < > 50  BILITOT  --    < > 0.6   < > = values in this interval not displayed.     Microbiology Results  Results for orders placed or performed during the hospital encounter of 06/22/20  MRSA PCR Screening     Status: None   Collection Time: 06/23/20  5:46 AM   Specimen: Nasopharyngeal  Result Value Ref Range Status   MRSA by PCR NEGATIVE NEGATIVE Final    Comment:        The GeneXpert MRSA Assay (FDA approved for NASAL specimens only), is one component of a comprehensive MRSA colonization surveillance program. It is not intended to diagnose MRSA infection nor to guide or monitor treatment for MRSA infections. Performed at [facility], [address], [place], [place] [address]     Management plans discussed with the patient, family and they are in agreement.  CODE STATUS:     Code Status Orders  (From admission, onward)         Start     Ordered   06/22/20 1659  Full code  Continuous        06/22/20 1706        Code Status History    Date Active Date Inactive Code Status Order ID Comments User Context   01/01/2018 1205 01/04/2018 1854 Full Code [id number]  Hillary Bow, MD ED   Advance Care Planning Activity      TOTAL TIME TAKING CARE OF THIS PATIENT: 35 minutes.    Loletha Grayer M.D on 07/02/2020 at 3:55 PM  Between 7am to 6pm - Pager - [phone]  After 6pm go to [email or web address] - password [email or web address]  [facility]  CC: Primary care physician; Jerrol Banana., MD

## 2020-07-02 NOTE — Care Management Important Message (Signed)
Important Message  Patient Details  Name: Dwayne Thompson MRN: [id number] Date of Birth: [birth year]   Medicare Important Message Given:  Yes     [name] 07/02/2020, 2:17 PM

## 2020-07-02 NOTE — Progress Notes (Signed)
Output  from pleurx catheter drain was 200 mL/s today.

## 2020-07-02 NOTE — Progress Notes (Signed)
Physical Therapy Treatment Patient Details Name: Dwayne Thompson MRN: [id number] DOB: October 27, 1945 Today's Date: 07/02/2020    History of Present Illness 74 yo male with onset of worsening SOB and tachycardia with hypotension was admitted from his MD office.  On admission had hypoxia and chest pain.  Pt has Lung CA history, with pleural effusion again, chest tube placed, new chest wall mass but chest tube has been since removed.  PMHx:  DJD thoracic spine, mesothelioma, pleural mets, cirrhosis, GERD, HTN, back pain, anemia, COPD, anemia    PT Comments    Patient alert, sitting EOB, RN at bedside assessing vitals. Pt agreeable to PT session and ambulation. The patient demonstrated improvement towards goals, but still exhibited difficulties with activity tolerance and endurance. The patient performed transfers with RW and CGA/supervision, cues for safety. He ambulated ~84ft with RW and CGA/supervision. Trialed on room air but placed on 2L due to mild desaturation once in standing. The patient exhibited decreased step height/length as well as gait velocity, cued for upright posture and RW placement. Pt up in chair, all needs in reach at end of session. RN notified of pt status and his wishes to wear 2L of O2 for a bit longer after therapy.  Recommendation remains appropriate.    Follow Up Recommendations  Home health PT;Supervision for mobility/OOB     Equipment Recommendations  Rolling walker with 5" wheels    Recommendations for Other Services       Precautions / Restrictions Precautions Precautions: Fall Precaution Comments: ck sats with all mobility Restrictions Weight Bearing Restrictions: No    Mobility  Bed Mobility               General bed mobility comments: Pt sitting EOB with RN at start of session  Transfers Overall transfer level: Needs assistance Equipment used: Rolling walker (2 wheeled) Transfers: Sit to/from Stand Sit to Stand: Supervision             Ambulation/Gait Ambulation/Gait assistance: Supervision;Min guard Gait Distance (Feet):  (15ft) Assistive device: Rolling walker (2 wheeled)   Gait velocity: reduced   General Gait Details: encouraged for upright posture and rest breaks as needed   Stairs             Wheelchair Mobility    Modified Rankin (Stroke Patients Only)       Balance Overall balance assessment: Needs assistance   Sitting balance-Leahy Scale: Fair Sitting balance - Comments: able to sit EOB modI   Standing balance support: Bilateral upper extremity supported;During functional activity Standing balance-Leahy Scale: Fair Standing balance comment: more comfortable with UE support in standing                            Cognition Arousal/Alertness: Awake/alert Behavior During Therapy: WFL for tasks assessed/performed Overall Cognitive Status: Within Functional Limits for tasks assessed                                        Exercises      General Comments        Pertinent Vitals/Pain Pain Assessment: No/denies pain    Home Living                      Prior Function            PT Goals (current goals can now  be found in the care plan section) Progress towards PT goals: Progressing toward goals    Frequency    Min 2X/week      PT Plan Current plan remains appropriate    Co-evaluation              AM-PAC PT "6 Clicks" Mobility   Outcome Measure  Help needed turning from your back to your side while in a flat bed without using bedrails?: None Help needed moving from lying on your back to sitting on the side of a flat bed without using bedrails?: A Little Help needed moving to and from a bed to a chair (including a wheelchair)?: A Little Help needed standing up from a chair using your arms (e.g., wheelchair or bedside chair)?: A Little Help needed to walk in hospital room?: A Little Help needed climbing 3-5 steps with a  railing? : Total 6 Click Score: 17    End of Session Equipment Utilized During Treatment: Oxygen Activity Tolerance: Patient limited by fatigue;Treatment limited secondary to medical complications (Comment) Patient left: in bed;with call bell/phone within reach;with bed alarm set Nurse Communication: Mobility status PT Visit Diagnosis: Muscle weakness (generalized) (M62.81);Other abnormalities of gait and mobility (R26.89);Pain Pain - Right/Left: Left Pain - part of body:  (chest wall)     Time: 2878-6767 PT Time Calculation (min) (ACUTE ONLY): 21 min  Charges:  $Therapeutic Exercise: 8-22 mins                     Lieutenant Diego PT, DPT 9:59 AM,07/02/20

## 2020-07-02 NOTE — Progress Notes (Signed)
[facility]  [address] [place], [place] [address] Phone: [phone]  Fax: [phone]   Office Visit:  07/06/20  Referring physician: Jerrol Banana.,*  Chief Complaint: Dwayne Thompson is a 74 y.o. male with extensive stage small cell lung cancer who is seen for assessment prior to cycle #1 carboplatin, etoposide and Tecentriq.  HPI:  The patient was admitted to [facility] from 06/22/2020 - 07/02/2020 for left-sided hemothorax. He received 1 unit of PRBCs on 06/23/2020.  He underwent left chest wall/pleural biopsy on 06/24/2020.  Pathology confirmed small cell lung cancer.  Immunostains were positive for synaptophysin, chromogranin and pankeratin. Stains for LCA (CD45) and TTF-1 were negative.  CEA was 2.7 on 06/27/2020.  He underwent video assisted thoracoscopy and port-a-cath insertion on 06/29/2020 by Dr. Genevive Bi. He qualified for oxygen at home. He was discharged with a Pleurx catheter.  Thoracic spine MRI with and without contrast on 06/28/2020 revealed a left paraspinous soft tissue mass extending from T9-10 through T12-L1, with invasion/involvement of the adjacent T10 and T11 vertebral bodies. Tumor partially extended into the left neural foramen at T11-12. There was no other significant extension into the spinal canal or epidural tumor. There were additional metastatic lesions involving the T1 and T10 vertebral bodies as well as the right pedicle of T6. There was no associated pathologic fracture. There was a heterogeneous and partially loculated left pleural effusion with consolidation signal intensity throughout the visualized left lower lung.  Brain MRI with and without contrast on 06/28/2020 revealed no evidence for intracranial metastatic disease. There was no other acute intracranial abnormality. There was mild to moderate chronic microvascular ischemic disease.  He attended chemotherapy class with Magdalene Patricia, RN on 06/30/2020.  Tumor  Board on 07/02/2020 recommended chemotherapy and immunotherapy. They also recommended radiation to bone metastasis if pain persisted after initiation of chemotherapy.  Labs followed: 05/20/2020: Hematocrit 43.7, hemoglobin 15.5, MCV 90.3, platelets 152,000, WBC   8,800. Ferritin 112. Iron saturation 13%. TIBC 328. 06/22/2020: Hematocrit 29.13, hemoglobin 9.8, MCV 90.9, platelets 220,000, WBC   9,800. 06/27/2020: Hematocrit 29.4, hemoglobin 10.1, MCV 89.9, platelets 186,000, WBC   8,600. Ferritin 359. Iron saturation 14%. TIBC 181. 07/01/2020: Hematocrit 29.5, hemoglobin 10.0, MCV 88.3, platelets 177,000, WBC 10,900.  Symptomatically, he has been fair. He has been staying at home. He is constipated and has a poor appetite. He has been drinking 2 Ensures per day and will try to drink more at home. His last bowel movement was on 07/01/2020. He takes Miralax BID and will be picking up Lactulose from the pharmacy today. He does not have any nausea medications at home.  His breathing is "pretty good" at rest, but he has shortness of breath on exertion. He uses 2 liters/min oxygen at night and prn. The patient's sister is an Therapist, sports and has been changing his Pleurx catheter. Only 25 ccs drained out yesterday.  The patient is not having any pain right now. When he does have pain, it is at his left chest wall. His wife gave him 10 mg oxycodone at 3:00 am. He has about 8 oxycodone pills left. The pain medicine sometimes makes him tired. He notes that he has a bed sore on his bottom.  He is not having any problems with his port. He used EMLA cream this morning.  The patient's blood pressure has been running low at home. He takes Midodrine QID and the last time he took it was last night.  The patient is  seeing Dr. Genevive Bi on Friday.  The patient is ready to begin treatment today.   Past Medical History:  Diagnosis Date  . Anemia   . Back pain   . BPH (benign prostatic hyperplasia)   . Cirrhosis ([place])   . COPD  (chronic obstructive pulmonary disease) ([place])   . ED (erectile dysfunction)   . GERD (gastroesophageal reflux disease)   . Hyperlipidemia   . Hypertension    Past Surgical History:  Procedure Laterality Date  . COLONOSCOPY Left 01/03/2018   Procedure: COLONOSCOPY;  Surgeon: Virgel Manifold, MD;  Location: [facility] ENDOSCOPY;  Service: Endoscopy;  Laterality: Left;  . COLONOSCOPY WITH PROPOFOL N/A 04/05/2018   Procedure: COLONOSCOPY WITH PROPOFOL;  Surgeon: Virgel Manifold, MD;  Location: [facility] ENDOSCOPY;  Service: Endoscopy;  Laterality: N/A;  . CYSTOSCOPY WITH STENT PLACEMENT Right 09/26/2019   Procedure: CYSTOSCOPY WITH STENT PLACEMENT;  Surgeon: Royston Cowper, MD;  Location: [facility] ORS;  Service: Urology;  Laterality: Right;  . ENTEROSCOPY  01/03/2018   Procedure: ENTEROSCOPY;  Surgeon: Virgel Manifold, MD;  Location: [facility] ENDOSCOPY;  Service: Endoscopy;;  . ESOPHAGOGASTRODUODENOSCOPY N/A 01/02/2018   Procedure: ESOPHAGOGASTRODUODENOSCOPY (EGD);  Surgeon: Virgel Manifold, MD;  Location: [facility] ENDOSCOPY;  Service: Endoscopy;  Laterality: N/A;  . ESOPHAGOGASTRODUODENOSCOPY (EGD) WITH PROPOFOL N/A 04/05/2018   Procedure: ESOPHAGOGASTRODUODENOSCOPY (EGD) WITH PROPOFOL;  Surgeon: Virgel Manifold, MD;  Location: [facility] ENDOSCOPY;  Service: Endoscopy;  Laterality: N/A;  . exploration and neurolysis of right peroneal nerve at the knee Right 08/27/2018  . GREEN LIGHT LASER TURP (TRANSURETHRAL RESECTION OF PROSTATE N/A 09/26/2019   Procedure: GREEN LIGHT LASER TURP (TRANSURETHRAL RESECTION OF PROSTATE;  Surgeon: Royston Cowper, MD;  Location: [facility] ORS;  Service: Urology;  Laterality: N/A;  . HERNIA REPAIR     1980's  . PORTACATH PLACEMENT Left 06/29/2020   Procedure: INSERTION PORT-A-CATH;  Surgeon: [name], MD;  Location: [facility] ORS;  Service: Thoracic;  Laterality: Left;  Marland Kitchen VIDEO ASSISTED THORACOSCOPY (VATS)/THOROCOTOMY Left 06/29/2020   Procedure: VIDEO ASSISTED THORACOSCOPY;   Surgeon: [name], MD;  Location: [facility] ORS;  Service: Thoracic;  Laterality: Left;   Family History  Problem Relation Age of Onset  . Alzheimer's disease Mother   . Sarcoidosis Sister   . Anxiety disorder Brother   . Kidney disease Neg Hx   . Prostate cancer Neg Hx      Social History: reports that he quit smoking about 6 years ago. His smoking use included cigarettes. He has a 50.00 pack-year smoking history. He has never used smokeless tobacco. He reports current alcohol use of about 21.0 - 28.0 standard drinks of alcohol per week. He reports that he does not use drugs. He drinks 2-3 alcohol shots/day. His wife's name isJanice and granddaughter's name isAriel. The patient is accompanied by his wife and Trinda Pascal, nurse navigator today.  Allergies:  Allergies  Allergen Reactions  . Indomethacin Swelling    ankle swelling  . Tamsulosin Hcl Hives  . Sulfa Antibiotics Rash   Current Outpatient Medications  Medication Instructions  . acetaminophen (TYLENOL) 650 mg, Oral, 3 times daily  . cetirizine (ZYRTEC) 10 mg, Oral, Daily  . cholecalciferol (VITAMIN D3) 1,000 Units, Oral, Daily  . cyanocobalamin 1,000 mcg, Oral, Daily  . diclofenac Sodium (VOLTAREN) 1 % GEL 1 application, Topical, 4 times daily PRN  . dutasteride (AVODART) 0.5 mg, Oral, Daily  . feeding supplement (ENSURE ENLIVE / ENSURE PLUS) LIQD 237 mLs, Oral, 2 times daily between meals  .  folic acid (FOLVITE) 1 mg, Oral, Daily  . lactulose (CHRONULAC) 30 g, Oral, 2 times daily  . lidocaine (LIDODERM) 5 % 1 patch, Transdermal, Every 24 hours, Remove & Discard patch within 12 hours or as directed by MD  . Lidocaine (SALONPAS PAIN RELIEVING) 4 % PTCH 1 patch, Apply externally,  Every morning - 10a  . midodrine (PROAMATINE) 10 mg, Oral, 4 times daily  . montelukast (SINGULAIR) 10 MG tablet TAKE 1 TABLET BY MOUTH EVERY DAY  . oxyCODONE (ROXICODONE) 5 mg, Oral, Every 4 hours PRN  . pantoprazole (PROTONIX) 20 mg, Oral,  2 times daily  . polyethylene glycol (MIRALAX / GLYCOLAX) 17 g, Oral, Daily PRN  . potassium chloride (K-DUR,KLOR-CON) 10 MEQ tablet 10 mEq, Oral, 2 times daily  . rosuvastatin (CRESTOR) 10 mg, Oral, Daily    Review of Systems  Constitutional: Negative for chills, diaphoresis, fever, malaise/fatigue and weight loss.  HENT: Negative for congestion, ear discharge, ear pain, hearing loss, nosebleeds, sinus pain, sore throat and tinnitus.   Eyes: Negative for blurred vision and double vision.  Respiratory: Positive for shortness of breath (on exertion). Negative for cough, hemoptysis and sputum production.        Left-sided chest wall pain. On 2 liters/min oxygen prn.  Cardiovascular: Negative for chest pain, palpitations and leg swelling.  Gastrointestinal: Positive for constipation (takes miralax BID). Negative for abdominal pain, blood in stool, diarrhea, heartburn, melena, nausea and vomiting.       Poor appetite. Drinking Ensure BID.  Genitourinary: Negative for frequency and urgency.  Musculoskeletal: Negative for back pain, joint pain, myalgias and neck pain.  Skin: Negative for itching and rash.       Bed sore.  Neurological: Negative for dizziness, tingling, sensory change, weakness and headaches.  Endo/Heme/Allergies: Does not bruise/bleed easily.  Psychiatric/Behavioral: Negative for depression and memory loss. The patient is not nervous/anxious and does not have insomnia.   All other systems reviewed and are negative.   Performance status: 2  Vital Signs Blood pressure (!) 102/58, pulse 92, temperature 97.8 F (36.6 C), temperature source Tympanic, weight 181 lb (82.1 kg), SpO2 97 %.  Physical Exam Vitals and nursing note reviewed.  Constitutional:      General: He is not in acute distress.    Appearance: He is not diaphoretic.  HENT:     Head: Normocephalic and atraumatic.     Comments: Short graying hair.    Mouth/Throat:     Mouth: Mucous membranes are moist.      Pharynx: Oropharynx is clear.  Eyes:     General: No scleral icterus.    Extraocular Movements: Extraocular movements intact.     Conjunctiva/sclera: Conjunctivae normal.     Pupils: Pupils are equal, round, and reactive to light.     Comments: Glasses. Brown eyes.  Cardiovascular:     Rate and Rhythm: Normal rate and regular rhythm.     Heart sounds: Normal heart sounds. No murmur heard.   Pulmonary:     Effort: Pulmonary effort is normal. No respiratory distress.     Breath sounds: Normal breath sounds. No wheezing or rales.  Chest:     Chest wall: No tenderness.  Abdominal:     General: Bowel sounds are normal. There is no distension.     Palpations: Abdomen is soft. There is no mass.     Tenderness: There is no abdominal tenderness. There is no guarding or rebound.  Musculoskeletal:        General: Tenderness (  left chest wall) present. No swelling. Normal range of motion.     Cervical back: Normal range of motion and neck supple.     Right lower leg: Edema (3+ ankle) present.     Left lower leg: Edema (3+ ankle) present.  Skin:    General: Skin is warm and dry.     Comments: Lidoderm patch on left chest. Linear skin breakdown on gluteal fold.  Neurological:     Mental Status: He is alert and oriented to person, place, and time.  Psychiatric:        Behavior: Behavior normal.        Thought Content: Thought content normal.        Judgment: Judgment normal.    Results for orders placed or performed in visit on 07/06/20 (from the past 48 hour(s))  CBC with Differential     Status: Abnormal   Collection Time: 07/06/20  8:09 AM  Result Value Ref Range   WBC 9.0 4.0 - 10.5 K/uL   RBC 3.35 (L) 4.22 - 5.81 MIL/uL   Hemoglobin 9.9 (L) 13.0 - 17.0 g/dL   HCT 28.6 (L) 39 - 52 %   MCV 85.4 80.0 - 100.0 fL   MCH 29.6 26.0 - 34.0 pg   MCHC 34.6 30.0 - 36.0 g/dL   RDW 12.5 11.5 - 15.5 %   Platelets 149 (L) 150 - 400 K/uL   nRBC 0.0 0.0 - 0.2 %   Neutrophils Relative % 73 %    Neutro Abs 6.5 1.7 - 7.7 K/uL   Lymphocytes Relative 11 %   Lymphs Abs 1.0 0.7 - 4.0 K/uL   Monocytes Relative 13 %   Monocytes Absolute 1.2 (H) 0.1 - 1.0 K/uL   Eosinophils Relative 1 %   Eosinophils Absolute 0.1 0.0 - 0.5 K/uL   Basophils Relative 0 %   Basophils Absolute 0.0 0.0 - 0.1 K/uL   Immature Granulocytes 2 %   Abs Immature Granulocytes 0.20 (H) 0.00 - 0.07 K/uL    Comment: Performed at [facility], [address] [place], [place] [address]   No results found.  Assessment:  Dwayne Thompson is a 74 y.o. male with extensive stage small cell lung cancer s/p left chest wall/pleural biopsy on 06/24/2020.  He presented with left hemothorax. Thoracentesis x2 (05/20/2020 and 06/05/2020) revealed bloody fluid with no evidence of malignancy.  He underwent video assisted thoracoscopy and Pleurx catheter placement on 06/29/2020.  Chest CT on 06/05/2020 revealed pleural thickening, nodularity and pleural-based mass with central adenopathy versus mass suspicious for pulmonary neoplasm with extensive pleural metastasis.  Mesothelioma is in the differential.  There was increasing left subcarinal lymph node use into the left mainstem bronchus.  There was increasing upper abdominal lymph nodes.  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  There was accentuated activity along the consolidative and atelectatic left lower lobe probably from tumor within the lung but potentially from tumor along the visceral pleura.  There was hypermetabolic thoracic adenopathy along with hypermetabolic upper abdominal adenopathy and hypermetabolic skeletal metastatic lesions (right iliac bone SUV 11.9 and T1). There was possible early tumor extension into the left T11-12 neuroforamen with some erosion of the adjacent T11 vertebral body.  Thoracic spine MRI with and without contrast on 06/28/2020 revealed a left  paraspinous soft tissue mass extending from T9-10 through T12-L1, with invasion/involvement of the adjacent T10 and T11 vertebral bodies.  Tumor partially extended into the left neural foramen at T11-12. There was no other significant extension into the spinal canal or epidural tumor. There were additional metastatic lesions involving the T1 and T10 vertebral bodies as well as the right pedicle of T6. There was no associated pathologic fracture. There was a heterogeneous and partially loculated left pleural effusion with consolidation signal intensity throughout the visualized left lower lung.  Brain MRI with and without contrast on 06/28/2020 revealed no evidence for intracranial metastatic disease. There was mild to moderate chronic microvascular ischemic disease.  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 He previously alcohol daily.  Symptomatically, he feels fair. He denies any shortness of breath at rest.  Pain is controlled.  He has constipation and a poor appetite.  He is ready to begin chemotherapy.  Plan:   1.   Labs today:  CBC with diff, CMP, Mg, TSH, free T4. 2.   Extensive stage small cell lung cancer             PET scan on 06/12/2020 revealed pleural based disease, hypermetabolic adenopathy, and skeletal metastasis.             Pleural biopsy on 06/24/2020 confirmed small cell lung cancer.  Review interval head MRI and thoracic spine MRI.  Review plan for 4 cycles of carboplatin, etopside, and Tecentriq every 3 weeks.   Potential side effects reviewed.   Patient consented to chemotherapy  Labs reviewed.  Begin cycle #1 carboplatin, etoposide and Tecentriq with Neulasta support.   Discuss symptom management.  He has antiemetics and pain medications at home to use on a prn bases.  Interventions are adequate.    Rx:  ondansetron and compazine.   3.   Recurrent pleural effusion  He is s/p Pleurx catheter placement.   Drainage has  decreased.  Follow-up with Dr. Faith Rogue as scheduled 4.   Normocytic anemia             Hematocrit 28.6.  Hemoglobin 8.9.  MCV 85.4 today.   Ferritin 359 with an iron saturation of 14% (low) and TIBC 181 on 06/27/2020.  He initially presented with a hemothorax.   Continue to monitor.         5.   Cancer-related pain             Symptomatically, his pain is well controlled.  He is taking oxycodone 10 mg p.o. every 4 hours as needed pain.  6.   Poor appetite  Discuss importance of caloric intake.  Ensure samples today  Discuss consideration of Megace.   Side effects reviewed.  Information provided.   Patient will contact clinic if he would like to initiate.  7.   Hypotension  Patient had a low blood pressure while hospitalized.  Patient discharged on midodrine 4 times a day.   Patient did not take this morning.  Begin IVF 150 cc/hr prior to chemotherapy.  Patient's wife bringing midodrine. 8.   Decubitus ulcer  Patient has a small linear area of skin breakdown.  Discuss avoiding pressure to area.  Small protective dressing in place. 9.  Day 1 of cycle #1 carboplatin, etoposide, and Tecentriq today. 10.   RTC tomorrow for day 2 etoposide. 11.   RTC on Wednesday for day 3 etoposide. 12.   RTC on Thursday for Neulasta. 13.   RTC on 07/15/2020 for MD nadir assessment and labs (CBC with diff, BMP, Mg).  I discussed the  assessment and treatment plan with the patient.  The patient was provided an opportunity to ask questions and all were answered.  The patient agreed with the plan and demonstrated an understanding of the instructions.  The patient was advised to call back or seek an in person evaluation if the symptoms worsen or if the condition fails to improve as anticipated.  I provided 19 minutes of face-to-face time during this this encounter and > 50% was spent counseling as documented under my assessment and plan.  An additional 15 minutes were spent reviewing his chart (Epic and Care  Everywhere) including notes, labs, and imaging studies.    Nolon Stalls, MD, PhD  07/06/2020, 5:21 PM   I, Evert Kohl, am acting as a Education administrator for Lequita Asal, MD.  I, Coc[place] Mike Gip, MD, have reviewed the above documentation for accuracy and completeness, and I agree with the above.

## 2020-07-02 NOTE — Plan of Care (Signed)
Discharge order received. Patient mental status is at baseline. Vital signs stable . No signs of acute distress. Discharge instructions given including new medications.  Pleurx drain/care education given to wife and patient' sister who is an Therapist, sports. Patient and family verbalized understanding. No other issues noted at this time. Supplies (pleurx kit) sent with patient O2 and walker were delivered in the room.

## 2020-07-02 NOTE — Progress Notes (Addendum)
Daily Progress Note   Patient Name: Dwayne Thompson       Date: 07/02/2020 DOB: February 18, 1946  Age: 74 y.o. MRN#: [id number] Attending Physician: Loletha Grayer, MD Primary Care Physician: Jerrol Banana., MD Admit Date: 06/22/2020  Reason for Consultation/Follow-up: Establishing goals of care  Subjective: Patient is sitting in bedside chair with wife at bedside. They are overwhelmed today. They are discussing both having terminal cancer, and what this will mean for the future. They both state they want a prognosis from their oncologists both with and without palliative oncology, to determine how much time the treatment may add.   They discuss that she has completed a living will and holds a DNR status, but he has not. Questions answered, and we discuss "vegetative state", "artificial hydration" and "artificial nutrition". We discuss what living means to him in regards to QOL, and where boundaries may be. He states if he is unable to care for himself as far as ADL's and hygiene, he would not consider that acceptable QOL. He states he would not want CPR or to be placed on a ventilator. MOST form completed per his wishes, but not signed by either party as he wishes to review it with their children before confirming decision. No changes to code status.    Recommend outpatient palliative.    Length of Stay: 10  Current Medications: Scheduled Meds:  . cholecalciferol  1,000 Units Oral Daily  . docusate sodium  100 mg Oral Daily  . dutasteride  0.5 mg Oral Daily  . feeding supplement  237 mL Oral BID BM  . folic acid  1 mg Oral Daily  . lactulose  30 g Oral BID  . lidocaine  1 patch Transdermal Q24H  . loratadine  10 mg Oral Daily  . midodrine  10 mg Oral TID WC  . montelukast  10 mg Oral  Daily  . pantoprazole  20 mg Oral BID  . polyethylene glycol  17 g Oral Daily  . rosuvastatin  10 mg Oral Daily  . cyanocobalamin  1,000 mcg Oral Daily    Continuous Infusions:   PRN Meds: acetaminophen **OR** acetaminophen, fentaNYL (SUBLIMAZE) injection, morphine injection, ondansetron **OR** ondansetron (ZOFRAN) IV, oxyCODONE, phenol  Physical Exam Pulmonary:     Effort: Pulmonary effort is normal.  Neurological:  Mental Status: He is alert.             Vital Signs: BP (!) 100/58 (BP Location: Left Arm)   Pulse 97   Temp 97.9 F (36.6 C) (Oral)   Resp 20   Ht 5\' 11"  (1.803 m)   Wt 86.8 kg   SpO2 94%   BMI 26.69 kg/m  SpO2: SpO2: 94 % O2 Device: O2 Device: Room Air O2 Flow Rate: O2 Flow Rate (L/min): 0 L/min  Intake/output summary:   Intake/Output Summary (Last 24 hours) at 07/02/2020 1349 Last data filed at 07/02/2020 0855 Gross per 24 hour  Intake 120 ml  Output 250 ml  Net -130 ml   LBM: Last BM Date: 06/30/20 Baseline Weight: Weight: 76.2 kg Most recent weight: Weight: 86.8 kg       Palliative Assessment/Data: 50%     Flowsheet Rows     Most Recent Value  Intake Tab  Referral Department Hospitalist  Unit at Time of Referral ER  Palliative Care Primary Diagnosis Pulmonary  Date Notified 06/22/20  Palliative Care Type New Palliative care  Reason for referral Clarify Goals of Care  Date of Admission 06/22/20  Date first seen by Palliative Care 06/23/20  # of days Palliative referral response time 1 Day(s)  # of days IP prior to Palliative referral 0  Clinical Assessment  Psychosocial & Spiritual Assessment  Palliative Care Outcomes      Patient Active Problem List   Diagnosis Date Noted  . Hypotension   . 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 Chest tube in place   . Pressure injury of skin 06/23/2020  . Pleural mass   . Bone metastasis ([place])   . Hemothorax   . Acute on chronic respiratory failure with hypoxia ([place]) 06/22/2020   . Cirrhosis ([place])   . Hemothorax on left   . Left flank pain 04/23/2020  . Hypertension 03/12/2019  . Thoracic aortic aneurysm ([place]) 03/12/2019  . Abnormal chest CT 02/13/2019  . Difficulty walking 12/06/2018  . Muscle weakness 12/06/2018  . Cellulitis of lower leg 09/17/2018  . CLE (columnar lined esophagus)   . Special screening for malignant neoplasms, colon   . Benign neoplasm of ascending colon   . Polyp of sigmoid colon   . Diverticulosis of large intestine without diverticulitis   . Iron deficiency anemia due to chronic blood loss 02/18/2018  . Folate deficiency 02/14/2018  . B12 deficiency 01/10/2018  . Folic acid deficiency 60/06/9322  . 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 Helicobacter pylori gastritis (chronic gastritis) 01/10/2018  . Thrombocytopenia ([place]) 01/10/2018  . Internal hemorrhoids   . Acute gastrointestinal hemorrhage   . Stomach irritation   . Duodenal nodule   . Melena 01/01/2018  . Neck pain on right side 03/23/2016  . History of tobacco abuse 03/23/2016  . Cervical lymphadenitis 03/23/2016  . 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 Allergic rhinitis 03/28/2015  . 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 Failure of erection 03/28/2015  . Acid reflux 03/28/2015  . Arthritis, degenerative 03/28/2015  . Change in blood platelet count 03/28/2015  . Temporary cerebral vascular dysfunction 03/28/2015  . Personal history of tobacco use, presenting hazards to health 03/28/2015    Palliative Care Assessment & Plan    Recommendations/Plan: Palliative oncology at cancer center with Billey Chang NP scheduled.  Code Status:    Code Status Orders  (From admission, onward)         Start     Ordered   06/22/20 1659  Full code  Continuous        06/22/20 1706        Code Status  History    Date Active Date Inactive Code Status Order ID Comments User Context   01/01/2018 1205 01/04/2018 1854 Full Code [id number]  Hillary Bow, MD ED   Advance Care Planning Activity      Prognosis:  < 6 months Palliative oncology     Thank you for allowing the Palliative Medicine Team to assist in the care of this patient.   Total Time 12:00-1:00 Prolonged Time Billed  no      Greater than 50%  of this time was spent counseling and coordinating care related to the above assessment and plan.  Asencion Gowda, NP  Please contact Palliative Medicine Team phone at [phone] for questions and concerns.

## 2020-07-02 NOTE — Discharge Instructions (Signed)
Foam dressing to cover buttocks   Hemothorax  Hemothorax is a buildup of blood in the space between the lungs and the chest wall (pleural cavity). This can cause trouble breathing, a collapsed lung (pneumothorax), and other dangerous problems. This condition is a medical emergency that must be treated right away in a hospital. What are the causes? This condition is most often caused by an injury (trauma) that causes a tear in a lung or in a blood vessel in the chest. Other possible causes include:  Tuberculosis.  An injury caused by placing a tube into a blood vessel in the chest (central venous catheter).  Cancer in the chest.  A problem with how your blood clots.  Blood thinner (anticoagulant) medicines.  Lung surgery or heart surgery. What are the signs or symptoms? Signs and symptoms may include:  Rapid breathing.  Difficulty breathing.  Shortness of breath.  Feeling light-headed.  Anxiety.  Restlessness.  A rapid heart rate.  Low blood pressure (hypotension).  Chest pain.  Skin that is cool, sweaty, pale, or blue. How is this diagnosed? This condition may be diagnosed based on:  Your symptoms and medical history. Your health care provider may ask about any recent injuries you have had.  A physical exam.  A chest X-ray.  Removal and testing of a blood sample from the pleural cavity. How is this treated? This condition must be treated at the hospital. Treatment may include:  A procedure to place a small tube into the pleural cavity (chest tube). The chest tube drains fluid, blood, and extra air. It can also be used to expand a pneumothorax, if needed.  Surgery to open the chest and control bleeding (thoracotomy). You may need surgery if bleeding continues after you have a chest tube placed.  IV fluids.  Blood transfusion. You may receive: ? Your own blood that is collected from a chest drainage device and infused back into your body  (autotransfusion). ? Blood from a donor (blood transfusion). Follow these instructions at home: Medicines  Take over-the-counter and prescription medicines only as told by your health care provider.  Do not drive or use heavy machinery while taking prescription pain medicine. General instructions  Return to your normal activities as told by your health care provider. Ask your health care provider what activities are safe for you.  If a cough or pain makes it difficult for you to sleep at night, try sleeping in a semi-upright position in a recliner or by using 2 or 3 pillows.  If you were given an incentive spirometer, use it every 1-2 hours while you are awake or as recommended by your health care provider. This device measures how well you are filling your lungs with each breath.  If you had a chest tube and it was removed, ask your health care provider when you can remove the bandage (dressing). While the dressing is in place, do not allow it to get wet.  Keep all follow-up visits as told by your health care provider. This is important.  Do not use any products that contain nicotine or tobacco, such as cigarettes and e-cigarettes. If you need help quitting, ask your health care provider. Contact a health care provider if:  Your pain is not controlled with the medicines you were prescribed.  You were treated with a chest tube, and you have redness, increasing pain, or discharge at the site where it was placed.  You have a fever. Get help right away if:  You have  difficulty breathing.  You begin coughing up blood.  You have chest pain. These symptoms may represent a serious problem that is an emergency. Do not wait to see if the symptoms will go away. Get medical help right away. Call your local emergency services (911 in the [place] Do not drive yourself to the hospital. Summary  Hemothorax is a buildup of blood in the space between the lungs and the chest wall (pleural  cavity).  This condition is a medical emergency that must be treated in a hospital right away.  Hemothorax is most often caused by an injury (trauma) that causes a tear in a lung or in a blood vessel in the chest. Other possible causes include tuberculosis, cancer, blood thinner (anticoagulant) medicines, lung or heart surgery, or a problem with how your blood clots.  Treatment includes receiving donated blood (blood transfusion) and having a small tube placed in the pleural cavity (chest tube). The tube can help drain blood, fluid, and extra air. It can also be used to expand a collapsed lung (pneumothorax).  In some cases, surgery is needed to stop the bleeding. This information is not intended to replace advice given to you by your health care provider. Make sure you discuss any questions you have with your health care provider. Document Revised: 10/02/2017 Document Reviewed: 10/02/2017 Elsevier Patient Education  2020 [employer].

## 2020-07-02 NOTE — Progress Notes (Signed)
Tumor Board Documentation  Dwayne Thompson was presented by Dr Mike Gip at our Tumor Board on 07/02/2020, which included representatives from medical oncology, radiation oncology, surgical oncology, internal medicine, navigation, pathology, radiology, surgical, pharmacy, genetics, research, palliative care, pulmonology.  Dwayne Thompson currently presents as a new patient, for [facility], for new positive pathology with history of the following treatments: active survellience, surgical intervention(s) (Pleurex Catheter, Thoracentesis).  Additionally, we reviewed previous medical and familial history, history of present illness, and recent lab results along with all available histopathologic and imaging studies. The tumor board considered available treatment options and made the following recommendations: Chemotherapy, Immunotherapy, Additional screening (Possible Liquid biopsy if not enough cells for path testing) Radiation to Bone mets if pain persists after Chemotherapy  The following procedures/referrals were also placed: No orders of the defined types were placed in this encounter.   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  Tumor board is a meeting of clinicians from various specialty areas who evaluate and discuss patients for whom a multidisciplinary approach is being considered. Final determinations in the plan of care are those of the provider(s). The responsibility for follow up of recommendations given during tumor board is that of the provider.   Today's extended care, comprehensive team conference, Dwayne Thompson was not present for the discussion and was not examined.   Multidisciplinary Tumor Board is a multidisciplinary case peer review process.  Decisions discussed in the Multidisciplinary Tumor Board reflect the opinions  of the specialists present at the conference without having examined the patient.  Ultimately, treatment and diagnostic decisions rest with the primary provider(s) and the patient.

## 2020-07-02 NOTE — Telephone Encounter (Signed)
Patient wife Dwayne Thompson, left message on my voice mail asking if EMLA cream can be called into pharmacy and wants to know what pharmacy has it been called in to.   Number is [phone].    Thank you.

## 2020-07-02 NOTE — TOC Progression Note (Addendum)
Transition of Care [facility]) - Progression Note    Patient Details  Name: Dwayne Thompson MRN: [id number] Date of Birth: March 16, 1946  [facility] CM/SW Contact  Candie Chroman, LCSW Phone Number: 07/02/2020, 9:58 AM  Clinical Narrative:  Ordered rolling walker and oxygen to be delivered to the room. So far no home health agency able to accept. Alvis Lemmings is reviewing referral again.  4:09 pm: No home health agency is able to accept. Ordered supplies through [employer]. They will fax orders and clinical request to Dr. Soyla Dryer office. It takes 5 days to process order and then they can get supplies to the home in about a day via [employer]. Patient will get 10 vacuum bottles per case and he is eligible for 6 cases per month. RN is aware and sending him home with 6 vacuum bottles. CSW gave wife information with [facility]'s phone number. Per RN, patient's sister is an Therapist, sports and can help manage at home. Faxed outpatient PT order to [facility]. Patient has received outpatient PT there before. No further concerns. Patient has orders to discharge home today. CSW signing off.  Expected Discharge Plan: Home/Self Care Barriers to Discharge: Continued Medical Work up  Expected Discharge Plan and Services Expected Discharge Plan: Home/Self Care       Living arrangements for the past 2 months: Single Family Home Expected Discharge Date: 07/02/20                                     Social Determinants of Health (SDOH) Interventions    Readmission Risk Interventions No flowsheet data found.

## 2020-07-03 ENCOUNTER — Encounter: Payer: Self-pay | Admitting: Family Medicine

## 2020-07-03 ENCOUNTER — Inpatient Hospital Stay (HOSPITAL_BASED_OUTPATIENT_CLINIC_OR_DEPARTMENT_OTHER): Payer: Medicare Other | Admitting: Nurse Practitioner

## 2020-07-03 ENCOUNTER — Telehealth: Payer: Self-pay

## 2020-07-03 DIAGNOSIS — C3490 Malignant neoplasm of unspecified part of unspecified bronchus or lung: Secondary | ICD-10-CM

## 2020-07-03 NOTE — Progress Notes (Signed)
Virtual Visit Progress Note  [place]  [phone] [phone]  Patient Care Team: Jerrol Banana., MD as PCP - General (Family Medicine) Virgel Manifold, MD as Consulting Physician (Gastroenterology) Lequita Asal, MD as Referring Physician (Hematology and Oncology) Tyler Pita, MD as Consulting Physician (Pulmonary Disease) Dimmig, Marcello Moores, MD as Referring Physician (Orthopedic Surgery) Royston Cowper, MD as Consulting Physician (Urology)   Name of the patient: Dwayne Thompson  [id number]  1945-11-03   Date of visit: 07/03/20  I connected with Dwayne Thompson on 07/03/20 at 11:00 AM EDT by telephone visit and verified that I am speaking with the correct person using two identifiers.   I discussed the limitations, risks, security and privacy concerns of performing an evaluation and management service by telemedicine and the availability of in-person appointments. I also discussed with the patient that there may be a patient responsible charge related to this service. The patient expressed understanding and agreed to proceed.   Other persons participating in the visit and their role in the encounter: Magdalene Patricia, Therapist, sports (Nurse Navigator & Chemo Education)  Patient's location: home Provider's location: clinic  Diagnosis-   Chief complaint/Reason for visit- Initial Meeting for [facility], preparing for starting chemotherapy  Heme/Onc history:  Oncology History  Small cell lung cancer ([place])  06/27/2020 Initial Diagnosis   Small cell lung cancer ([place])   07/06/2020 -  Chemotherapy   The patient had palonosetron (ALOXI) injection 0.25 mg, 0.25 mg, Intravenous,  Once, 0 of 4 cycles pegfilgrastim (NEULASTA) injection 6 mg, 6 mg, Subcutaneous, Once, 0 of 4 cycles CARBOplatin (PARAPLATIN) 520 mg in sodium chloride 0.9 % 250 mL chemo infusion, 520 mg (100 % of original dose 523 mg),  Intravenous,  Once, 0 of 4 cycles Dose modification:   (original dose 523 mg, Cycle 1) etoposide (VEPESID) 210 mg in sodium chloride 0.9 % 1,000 mL chemo infusion, 100 mg/m2 = 210 mg, Intravenous,  Once, 0 of 4 cycles fosaprepitant (EMEND) 150 mg in sodium chloride 0.9 % 145 mL IVPB, 150 mg, Intravenous,  Once, 0 of 4 cycles atezolizumab (TECENTRIQ) 1,200 mg in sodium chloride 0.9 % 250 mL chemo infusion, 1,200 mg, Intravenous, Once, 0 of 8 cycles  for chemotherapy treatment.      Interval history-  74 year old male patient newly diagnosed with extensive stage small cell lung cancer who presents to chemo care clinic today for initial meeting in preparation for starting chemotherapy. I introduced the chemo care clinic and we discussed that the role of the clinic is to assist those who are at an increased risk of emergency room visits and/or complications during the course of chemotherapy treatment. We discussed that the increased risk takes into account factors such as age, performance status, and co-morbidities. We also discussed that for some, this might include barriers to care such as not having a primary care provider, lack of insurance/transportation, or not being able to afford medications. We discussed that the goal of the program is to help prevent unplanned ER visits and help reduce complications during chemotherapy. We do this by discussing specific risk factors to each individual and identifying ways that we can help improve these risk factors and reduce barriers to care.   Review of systems- Review of Systems  Constitutional: Positive for malaise/fatigue and weight loss. Negative for chills and fever.  HENT: Negative for hearing loss, nosebleeds, sore throat and tinnitus.   Eyes:  Negative for blurred vision and double vision.  Respiratory: Positive for shortness of breath. Negative for cough, hemoptysis and wheezing.   Cardiovascular: Positive for chest pain. Negative for palpitations and  leg swelling.  Gastrointestinal: Negative for abdominal pain, blood in stool, constipation, diarrhea, melena, nausea and vomiting.  Genitourinary: Negative for dysuria and urgency.  Musculoskeletal: Positive for back pain. Negative for falls, joint pain and myalgias.  Skin: Negative for itching and rash.  Neurological: Positive for weakness. Negative for dizziness, tingling, sensory change, loss of consciousness and headaches.  Endo/Heme/Allergies: Negative for environmental allergies. Does not bruise/bleed easily.  Psychiatric/Behavioral: Negative for depression. The patient is not nervous/anxious and does not have insomnia.      Anticipated treatment: Carboplatin-etoposide-atezolizumab induction every 21 days followed by atezolizumab maintenance  Allergies  Allergen Reactions  . Indomethacin Swelling    ankle swelling  . Tamsulosin Hcl Hives  . Sulfa Antibiotics Rash    Past Medical History:  Diagnosis Date  . Anemia   . Back pain   . BPH (benign prostatic hyperplasia)   . Cirrhosis ([place])   . COPD (chronic obstructive pulmonary disease) ([place])   . ED (erectile dysfunction)   . GERD (gastroesophageal reflux disease)   . Hyperlipidemia   . Hypertension     Past Surgical History:  Procedure Laterality Date  . COLONOSCOPY Left 01/03/2018   Procedure: COLONOSCOPY;  Surgeon: Virgel Manifold, MD;  Location: [facility] ENDOSCOPY;  Service: Endoscopy;  Laterality: Left;  . COLONOSCOPY WITH PROPOFOL N/A 04/05/2018   Procedure: COLONOSCOPY WITH PROPOFOL;  Surgeon: Virgel Manifold, MD;  Location: [facility] ENDOSCOPY;  Service: Endoscopy;  Laterality: N/A;  . CYSTOSCOPY WITH STENT PLACEMENT Right 09/26/2019   Procedure: CYSTOSCOPY WITH STENT PLACEMENT;  Surgeon: Royston Cowper, MD;  Location: [facility] ORS;  Service: Urology;  Laterality: Right;  . ENTEROSCOPY  01/03/2018   Procedure: ENTEROSCOPY;  Surgeon: Virgel Manifold, MD;  Location: [facility] ENDOSCOPY;  Service: Endoscopy;;  .  ESOPHAGOGASTRODUODENOSCOPY N/A 01/02/2018   Procedure: ESOPHAGOGASTRODUODENOSCOPY (EGD);  Surgeon: Virgel Manifold, MD;  Location: [facility] ENDOSCOPY;  Service: Endoscopy;  Laterality: N/A;  . ESOPHAGOGASTRODUODENOSCOPY (EGD) WITH PROPOFOL N/A 04/05/2018   Procedure: ESOPHAGOGASTRODUODENOSCOPY (EGD) WITH PROPOFOL;  Surgeon: Virgel Manifold, MD;  Location: [facility] ENDOSCOPY;  Service: Endoscopy;  Laterality: N/A;  . exploration and neurolysis of right peroneal nerve at the knee Right 08/27/2018  . GREEN LIGHT LASER TURP (TRANSURETHRAL RESECTION OF PROSTATE N/A 09/26/2019   Procedure: GREEN LIGHT LASER TURP (TRANSURETHRAL RESECTION OF PROSTATE;  Surgeon: Royston Cowper, MD;  Location: [facility] ORS;  Service: Urology;  Laterality: N/A;  . HERNIA REPAIR     1980's  . PORTACATH PLACEMENT Left 06/29/2020   Procedure: INSERTION PORT-A-CATH;  Surgeon: [name], MD;  Location: [facility] ORS;  Service: Thoracic;  Laterality: Left;  Marland Kitchen VIDEO ASSISTED THORACOSCOPY (VATS)/THOROCOTOMY Left 06/29/2020   Procedure: VIDEO ASSISTED THORACOSCOPY;  Surgeon: [name], MD;  Location: [facility] ORS;  Service: Thoracic;  Laterality: Left;    Social History   Socioeconomic History  . Marital status: Married    Spouse name: [name]  . Number of children: 3  . Years of education: 59  . Highest education level: 12th grade  Occupational History  . Occupation: retired  Tobacco Use  . Smoking status: Former Smoker    Packs/day: 1.00    Years: 50.00    Pack years: 50.00    Types: Cigarettes    Quit date: 10/20/2013    Years since quitting: 6.7  .  Smokeless tobacco: Never Used  Vaping Use  . Vaping Use: Never used  Substance and Sexual Activity  . Alcohol use: Yes    Alcohol/week: 21.0 - 28.0 standard drinks    Types: 21 - 28 Shots of liquor per week    Comment: 3-4 gin and sodas a day  . Drug use: No  . Sexual activity: Not Currently  Other Topics Concern  . Not on file  Social History Narrative  . Not on  file   Social Determinants of Health   Financial Resource Strain: Low Risk   . Difficulty of Paying Living Expenses: Not hard at all  Food Insecurity: No Food Insecurity  . Worried About Charity fundraiser in the Last Year: Never true  . Ran Out of Food in the Last Year: Never true  Transportation Needs: No Transportation Needs  . Lack of Transportation (Medical): No  . Lack of Transportation (Non-Medical): No  Physical Activity: Insufficiently Active  . Days of Exercise per Week: 1 day  . Minutes of Exercise per Session: 60 min  Stress: No Stress Concern Present  . Feeling of Stress : Not at all  Social Connections: Moderately Integrated  . Frequency of Communication with Friends and Family: Three times a week  . Frequency of Social Gatherings with Friends and Family: Once a week  . Attends Religious Services: Never  . Active Member of Clubs or Organizations: Yes  . Attends Archivist Meetings: 1 to 4 times per year  . Marital Status: Married  Human resources officer Violence: Not At Risk  . Fear of Current or Ex-Partner: No  . Emotionally Abused: No  . Physically Abused: No  . Sexually Abused: No    Family History  Problem Relation Age of Onset  . Alzheimer's disease Mother   . Sarcoidosis Sister   . Anxiety disorder Brother   . Kidney disease Neg Hx   . Prostate cancer Neg Hx      Current Outpatient Medications:  .  acetaminophen (TYLENOL) 325 MG tablet, Take 2 tablets (650 mg total) by mouth 3 (three) times daily. (Patient taking differently: Take 650 mg by mouth every 6 (six) hours as needed for moderate pain. ), Disp: 180 tablet, Rfl: 0 .  cetirizine (ZYRTEC) 10 MG tablet, Take 1 tablet (10 mg total) by mouth daily., Disp: 90 tablet, Rfl: 4 .  cholecalciferol (VITAMIN D3) 25 MCG (1000 UNIT) tablet, Take 1,000 Units by mouth daily., Disp: , Rfl:  .  diclofenac Sodium (VOLTAREN) 1 % GEL, Apply 1 application topically 4 (four) times daily as needed (pain)., Disp: ,  Rfl:  .  dutasteride (AVODART) 0.5 MG capsule, Take 0.5 mg by mouth daily., Disp: , Rfl:  .  feeding supplement (ENSURE ENLIVE / ENSURE PLUS) LIQD, Take 237 mLs by mouth 2 (two) times daily between meals., Disp: 14220 mL, Rfl: 0 .  folic acid (FOLVITE) 1 MG tablet, Take 1 tablet (1 mg total) by mouth daily., Disp: 30 tablet, Rfl: 0 .  lactulose (CHRONULAC) 10 GM/15ML solution, Take 45 mLs (30 g total) by mouth 2 (two) times daily., Disp: 1992 mL, Rfl: 0 .  lidocaine (LIDODERM) 5 %, Place 1 patch onto the skin daily. Remove & Discard patch within 12 hours or as directed by MD, Disp: 30 patch, Rfl: 0 .  Lidocaine (SALONPAS PAIN RELIEVING) 4 % PTCH, Apply 1 patch topically every morning., Disp: 30 patch, Rfl: 0 .  midodrine (PROAMATINE) 10 MG tablet, Take 1 tablet (10  mg total) by mouth 4 (four) times daily., Disp: 120 tablet, Rfl: 0 .  montelukast (SINGULAIR) 10 MG tablet, TAKE 1 TABLET BY MOUTH EVERY DAY (Patient taking differently: Take 10 mg by mouth daily. ), Disp: 90 tablet, Rfl: 3 .  oxyCODONE (ROXICODONE) 5 MG immediate release tablet, Take 1 tablet (5 mg total) by mouth every 4 (four) hours as needed for severe pain., Disp: 55 tablet, Rfl: 0 .  pantoprazole (PROTONIX) 20 MG tablet, Take 20 mg by mouth 2 (two) times daily., Disp: , Rfl:  .  polyethylene glycol (MIRALAX / GLYCOLAX) 17 g packet, Take 17 g by mouth daily as needed for moderate constipation., Disp: 30 each, Rfl: 0 .  potassium chloride (K-DUR,KLOR-CON) 10 MEQ tablet, Take 1 tablet (10 mEq total) by mouth 2 (two) times daily., Disp: 180 tablet, Rfl: 3 .  rosuvastatin (CRESTOR) 10 MG tablet, Take 1 tablet (10 mg total) by mouth daily., Disp: 90 tablet, Rfl: 3 .  vitamin B-12 1000 MCG tablet, Take 1 tablet (1,000 mcg total) by mouth daily., Disp: 180 tablet, Rfl: 1  Physical exam: Exam limited d/t telemedicine  CMP Latest Ref Rng & Units 07/01/2020  Glucose 70 - 99 mg/dL 99  BUN 8 - 23 mg/dL 11  Creatinine 0.61 - 1.24 mg/dL 0.82    Sodium 135 - 145 mmol/L 133(L)  Potassium 3.5 - 5.1 mmol/L 4.1  Chloride 98 - 111 mmol/L 98  CO2 22 - 32 mmol/L 25  Calcium 8.9 - 10.3 mg/dL 8.3(L)  Total Protein 6.5 - 8.1 g/dL 6.1(L)  Total Bilirubin 0.3 - 1.2 mg/dL 0.6  Alkaline Phos 38 - 126 U/L 50  AST 15 - 41 U/L 65(H)  ALT 0 - 44 U/L 52(H)   CBC Latest Ref Rng & Units 07/01/2020  WBC 4.0 - 10.5 K/uL 10.9(H)  Hemoglobin 13.0 - 17.0 g/dL 10.0(L)  Hematocrit 39 - 52 % 29.5(L)  Platelets 150 - 400 K/uL 177    No images are attached to the encounter.  DG Chest 2 View  Result Date: 06/22/2020 CLINICAL DATA:  Shortness of breath.  Hypotensive. EXAM: CHEST - 2 VIEW COMPARISON:  06/17/2020 FINDINGS: Complete opacification of the left lung with rightward shift of the mediastinum. Linear opacities in the right lung base. No visible pneumothorax. Cardiomediastinal silhouette is largely obscured. No acute osseous abnormality. IMPRESSION: 1. Complete opacification of the left lung, likely related to a large pleural effusion (increased from prior) and possibly overlying consolidation. There is rightward mediastinal shift. Correlate for evidence of tension physiology. 2. Linear opacities in the right lung base, which could represent atelectasis, aspiration, and/or pneumonia. Electronically Signed   By: Margaretha Sheffield MD   On: 06/22/2020 11:45   DG Chest 2 View  Result Date: 06/18/2020 CLINICAL DATA:  Chest pain. Follow-up of left pleural effusion. Cough and shortness of breath. History of COPD, hypertension, former smoker. EXAM: CHEST - 2 VIEW COMPARISON:  05/27/2020 FINDINGS: Cardiac enlargement. Large left pleural effusion is increasing since previous study. Consolidation and volume loss in the left lung is also increasing. Patchy infiltrates and peribronchial thickening are developing in the right lung base. Scarring in the right apex. No visible pneumothorax. IMPRESSION: Increasing left pleural effusion and increasing consolidation and  volume loss in the left lung. Developing infiltration and peribronchial thickening in the right lung base. Electronically Signed   By: Lucienne Capers M.D.   On: 06/18/2020 22:56   CT Chest Wo Contrast  Result Date: 06/05/2020 CLINICAL DATA:  Abnormal x-ray,  pleural effusion. EXAM: CT CHEST WITHOUT CONTRAST TECHNIQUE: Multidetector CT imaging of the chest was performed following the standard protocol without IV contrast. COMPARISON:  May 15, 2020 and thoracentesis of June 05, 2020 FINDINGS: Cardiovascular: Stable mild dilation of ascending thoracic aorta to approximately 3.8 cm. Central pulmonary vasculature approximately 3.3 cm. Calcified coronary artery disease. Limited assessment of cardiovascular structures given lack of intravenous contrast. No pericardial effusion. Mediastinum/Nodes: Subcarinal nodal enlargement approximately 15 mm on image 74 series 3, increased slightly in size since the study of May 15, 2020. No thoracic inlet adenopathy.  No axillary lymphadenopathy. Lungs/Pleura: Pulmonary emphysema.  Subpleural reticulation. Improved aeration following LEFT thoracentesis still with a nodular pattern in the central portion of the LEFT chest about bronchovascular structures. This is along the major fissure is associated with subpleural lymph node involvement. LEFT infrahilar nodal enlargement versus central pulmonary lesion suggested on image 91 and 93 of series 2 where there is soft tissue that is contiguous with septal thickening and nodularity extending to the periphery along bronchial structures and displacing bronchial structures medially. Some of these areas un data Benjie Karvonen represent central pulmonary vasculature though the lobulated contour and appearance suggests nodal enlargement or central mass or nodule in addition to vascular structures in the central LEFT chest. Still with persistent pleural fluid. Juxtapleural nodule in the LEFT lung base (image 105 of series 2) 1.9 x 1.0 cm.  Discrete nodule on image 95 series 2 could also represent loculated fluid in the major fissure. Bulky masslike area in the inferior costodiaphragmatic sulcus anteriorly on the LEFT measuring 3.1 x 4.3 cm (image 143 series 3. Numerous other areas of pleural nodularity, less pronounced with involvement of extrapleural fat in the LEFT chest and thickening that extends to the medial costodiaphragmatic sulcus on image 114 of series 3. This can also be seen tracking inferiorly into the inferior costodiaphragmatic sulcus abutting the anterior margin of the T12 vertebral level on the LEFT on image 140 of series 3 Upper Abdomen: Low-density hepatic lesions are unchanged. Small lymph nodes in the hepatic gastric ligament are increased in size from previous imaging (image 137, series 3) 12 mm. Musculoskeletal: Sclerotic bone lesion on image 113 of series 3 in LEFT lateral seventh rib antral laterally. Similar to prior exam. Tiny sclerotic focus in the LEFT anterior fourth rib also similar findings while nonspecific are suspicious given other findings. IMPRESSION: 1. Pleural thickening, nodularity and pleural based mass with central adenopathy versus mass suspicious for pulmonary neoplasm with extensive pleural metastases. Mesothelioma is also considered given distribution with unilateral pleural involvement. Due to unilateral involvement metastatic disease from other primary is considered lest likely but could potentially have this appearance. 2. Increasing size of LEFT subcarinal lymph node adjacent to LEFT mainstem bronchus without additional signs of more central adenopathy in the chest aside from areas outlined above. 3. Increasing size of upper abdominal lymph nodes suspicious for disease below the diaphragm, consider abdominal imaging for further evaluation and staging. 4. Improved aeration following thoracentesis in the LEFT chest since previous imaging. 5. Calcified coronary artery disease. 6. Emphysema and aortic  atherosclerosis. Aortic Atherosclerosis (ICD10-I70.0) and Emphysema (ICD10-J43.9). Electronically Signed   By: Zetta Bills M.D.   On: 06/05/2020 16:13   MR BRAIN W WO CONTRAST  Result Date: 06/29/2020 CLINICAL DATA:  Initial evaluation for small cell lung cancer, staging. EXAM: MRI HEAD WITHOUT AND WITH CONTRAST TECHNIQUE: Multiplanar, multiecho pulse sequences of the brain and surrounding structures were obtained without and with intravenous contrast. CONTRAST:  7.12mL GADAVIST GADOBUTROL 1 MMOL/ML IV SOLN COMPARISON:  Prior MRI from 03/04/2013. FINDINGS: Brain: Examination degraded by motion artifact. Age-related cerebral atrophy with mild-to-moderate chronic microvascular ischemic disease. No acute intracranial infarct. Gray-white matter differentiation maintained. No encephalomalacia to suggest chronic cortical infarction. No foci of susceptibility artifact to suggest acute or chronic intracranial hemorrhage. No mass lesion, midline shift or mass effect. No hydrocephalus or extra-axial fluid collection. Pituitary gland suprasellar region normal. Midline structures intact. No abnormal enhancement or findings to suggest intracranial metastatic disease. Vascular: Major intracranial vascular flow voids are maintained. Skull and upper cervical spine: Craniocervical junction within normal limits. No focal marrow replacing lesion. Multilevel degenerative spondylolysis noted within the upper cervical spine. No scalp soft tissue abnormality. Sinuses/Orbits: Globes and orbital soft tissues demonstrate no acute finding. Paranasal sinuses are largely clear. No mastoid effusion. Inner ear structures grossly normal. Other: None. IMPRESSION: 1. No evidence for intracranial metastatic disease. 2. No other acute intracranial abnormality. 3. Mild to moderate chronic microvascular ischemic disease. Electronically Signed   By: Jeannine Boga M.D.   On: 06/29/2020 03:01   MR THORACIC SPINE W WO CONTRAST  Result  Date: 06/29/2020 CLINICAL DATA:  Initial evaluation for non-small cell lung cancer, pretreatment staging. Abnormality T11-12. EXAM: MRI THORACIC WITHOUT AND WITH CONTRAST TECHNIQUE: Multiplanar and multiecho pulse sequences of the thoracic spine were obtained without and with intravenous contrast. CONTRAST:  7.29mL GADAVIST GADOBUTROL 1 MMOL/ML IV SOLN COMPARISON:  Prior PET-CT from 06/12/2020. FINDINGS: MRI THORACIC SPINE FINDINGS Alignment: Vertebral bodies normally aligned with preservation of the normal thoracic kyphosis. No listhesis. Vertebrae: Signal intensity within the visualized bone marrow is diffusely heterogeneous. A left paraspinous soft tissue mass is seen along the left anterolateral aspect of the vertebral column extending from approximately T9-10 through T12-L1 (series 20, image 18). Evidence for invasion/involvement of the adjacent T10 and T11 vertebral bodies (series 21, image 14). Tumor partially extends into the left neural foramen at T11-12 (series 40, image 34). No other significant extension into the spinal canal or epidural tumor. A separate 13 mm metastasis seen involving the central/right aspect of the T10 vertebral body (series 39, image 8). Probable additional 13 mm metastatic lesion seen at T1 (series 20, image 9). 1 cm lesion seen within the right pedicle of T6 (series 21, image 6). No associated pathologic fracture. Cord:  Normal signal and morphology.  No significant epidural tumor. Paraspinal and other soft tissues: Paraspinous tumor as above. Paraspinous soft tissues demonstrate no other acute finding. Irregular and partially loculated left pleural effusion with scattered fluid-fluid levels partially visualized at the left lung base. Consolidative changes noted throughout the posterior left lung superiorly. Disc levels: Normal expected multilevel degenerative disc desiccation seen within the thoracic spine. Mild disc bulging noted at the T7-8 level without significant spinal  stenosis. Multilevel facet hypertrophy throughout the lower thoracic spine. No significant stenosis or foraminal encroachment. IMPRESSION: 1. 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 Tumor partially extends into the left neural foramen at T11-12. No other significant extension into the spinal canal or epidural tumor. 2. Additional metastatic lesions involving the T1 and T10 vertebral bodies as well as the right pedicle of T6. No associated pathologic fracture. 3. Heterogeneous and partially loculated left pleural effusion with consolidation signal intensity throughout the visualized left lower lung. Electronically Signed   By: Jeannine Boga M.D.   On: 06/29/2020 03:18   NM PET Image Initial (PI) Skull  Base To Thigh  Result Date: 06/12/2020 CLINICAL DATA:  Initial treatment strategy for left pulmonary nodule. EXAM: NUCLEAR MEDICINE PET SKULL BASE TO THIGH TECHNIQUE: 10.6 mCi F-18 FDG was injected intravenously. Full-ring PET imaging was performed from the skull base to thigh after the radiotracer. CT data was obtained and used for attenuation correction and anatomic localization. Fasting blood glucose: 99 mg/dl COMPARISON:  Chest CT 06/05/2020 FINDINGS: Mediastinal blood pool activity: SUV max 2.4 Liver activity: SUV max NA NECK: No significant abnormal hypermetabolic activity in this region. Incidental CT findings: none CHEST: Extensive hypermetabolic pleural deposits along the visceral parietal pleura in the left chest. The posteromedial rind of tumor in the left thorax representative location has a maximum SUV of 17.0. A left anterior pleural tumor deposit measuring about 3.5 by 3.7 cm on image 104/4 has a maximum SUV 17.4. Numerous foci of pleural-based tumor present on the left along with the large loculated left pleural effusion. There is consolidation and volume loss in the left lower lobe with  extensive accentuated metabolic activity within the long in this region, maximum SUV 11.3. Difficult to discern a discrete mass as much of this activity could be from visceral pleural tumor. This abnormal activity extends up into the left hilum where the maximum SUV is 9.7. There is hypermetabolic left paratracheal, subcarinal, internal mammary, and subpectoral adenopathy in the chest. The subcarinal lymph node measures 1.6 cm in short axis on image 74/4 and has maximum SUV of 16.8. A left internal mammary node has short axis diameter 1.0 cm on image 63/4 and maximum SUV 7.8. There is a focus of malignancy along the left of the xiphoid measuring 1.0 by 1.4 cm on image 102/4 with maximum SUV of 6.3. Incidental CT findings: There is shift of cardiac and mediastinal structures to the right. Diffusely mildly prominent thyroid gland. Centrilobular emphysema. Aortic Atherosclerosis (ICD10-I70.0). ABDOMEN/PELVIS: Hypermetabolic right gastric and periaortic adenopathy in the upper abdomen. A left periaortic lymph node just anterior to the left hemidiaphragmatic crus measures 1.7 cm in short axis on image 119/4 with maximum SUV 12.5. Incidental CT findings: Hypodense lesions in the liver are not hypermetabolic are probably cysts or similar benign lesions. Aortoiliac atherosclerotic vascular disease. Left mid kidney cysts medially. Excreted FDG in the urinary bladder, prostate accurate eighth rib, and penile urethra with a penile clamp in place. SKELETON: Foci of hypermetabolic tumor present in iliac bones and sacrum, including a right iliac bone hypermetabolic lesion with maximum SUV of 11.9. These osseous metastatic lesions are relatively inconspicuous on the CT data. There is paraspinal tumor along the pleural margin on the left at T11 and T12 believed to be eroding into the T11 vertebral body along the T11-12 neural foramen. Small focus of tumor the right proximal femur below the lesser trochanter. Incidental CT findings:  none IMPRESSION: 1. 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 Accentuated activity along the consolidated and atelectatic left lower lobe probably from tumor within the lung but potentially from tumor along the visceral pleural. Hypermetabolic thoracic adenopathy along with hypermetabolic upper abdominal adenopathy and hypermetabolic skeletal metastatic lesions as detailed above. 2. Possibly early tumor extension into the left T11-12 neural foramen, with some erosion of the adjacent T11 vertebral body. 3. Other imaging findings of potential clinical significance: Aortic Atherosclerosis (ICD10-I70.0) and Emphysema (ICD10-J43.9). Electronically Signed   By: Van Clines M.D.   On: 06/12/2020 17:00   CT BIOPSY  Result Date: 06/24/2020 INDICATION: 74 year old male with  a history of suspected pleural metastases/chest wall mass, FDG avid on prior PET referred for biopsy EXAM: CT BIOPSY MEDICATIONS: None. ANESTHESIA/SEDATION: Moderate (conscious) sedation was employed during this procedure. A total of Versed 0.5 mg and Fentanyl 25 mcg was administered intravenously. Moderate Sedation Time: 10 minutes. The patient's level of consciousness and vital signs were monitored continuously by radiology nursing throughout the procedure under my direct supervision. FLUOROSCOPY TIME:  CT COMPLICATIONS: None PROCEDURE: The procedure, risks, benefits, and alternatives were explained to the patient and the patient's family. Specific risks that were addressed included bleeding, infection, pneumothorax, need for further procedure including chest tube placement, chance of delayed pneumothorax or hemorrhage, hemoptysis, nondiagnostic sample, cardiopulmonary collapse, death. Questions regarding the procedure were encouraged and answered. The patient understands and consents to the procedure. Patient was positioned in the supine position on the CT gantry table and a  scout CT of the chest was performed for planning purposes. Once angle of approach was determined, the skin and subcutaneous tissues this scan was prepped and draped in the usual sterile fashion, and a sterile drape was applied covering the operative field. A sterile gown and sterile gloves were used for the procedure. Local anesthesia was provided with 1% Lidocaine. The skin and subcutaneous tissues were infiltrated 1% lidocaine for local anesthesia, and a small stab incision was made with an 11 blade scalpel. Using CT guidance, a 17 gauge trocar needle was advanced into the left lower anterior chest walltarget. After confirmation of the tip, separate 18 gauge core biopsies were performed. These were placed into solution for transportation to the lab. Final CT was acquired Patient tolerated the procedure well and remained hemodynamically stable throughout. No complications were encountered and no significant blood loss was encounter IMPRESSION: Status post CT-guided biopsy of left chest wall mass, FDG avid on prior PET. Signed, Dulcy Fanny. Dellia Nims, RPVI [facility] Electronically Signed   By: Corrie Mckusick D.O.   On: 06/24/2020 12:20   DG Chest Port 1 View  Result Date: 06/29/2020 CLINICAL DATA:  Status post Port-A-Cath placement. EXAM: PORTABLE CHEST 1 VIEW COMPARISON:  June 25, 2020. FINDINGS: Stable cardiomegaly. Interval placement of left subclavian Port-A-Cath with distal tip in expected position of cavoatrial junction. No pneumothorax pleural effusion is noted. Right lung is clear. Left pleural effusion is significantly smaller status post pleural drainage catheter placement. Diffuse left lung opacity is noted concerning for atelectasis or possibly inflammation. Bony thorax is unremarkable. IMPRESSION: Interval placement of left subclavian Port-A-Cath with distal tip in expected position of cavoatrial junction. Left pleural effusion is  significantly smaller status post pleural drainage catheter placement. Electronically Signed   By: Marijo Conception M.D.   On: 06/29/2020 13:30   DG Chest Port 1 View  Result Date: 06/25/2020 CLINICAL DATA:  Chest tube. EXAM: PORTABLE CHEST 1 VIEW COMPARISON:  06/24/2020. FINDINGS: Left chest tube in stable position. No pneumothorax. Prominent skin folds again noted on the right. Persistent left base atelectasis/infiltrate. Small left pleural effusion. No pneumothorax. Cardiomegaly. Degenerative changes scoliosis thoracic spine. IMPRESSION: 1.  Left chest tube in stable position. No pneumothorax. 2. Persistent left base atelectasis/infiltrate. Small left pleural effusion. Similar findings on prior exam. Electronically Signed   By: [place]   On: 06/25/2020 05:01   DG Chest Port 1 View  Result Date: 06/24/2020 CLINICAL DATA:  Chest tube. EXAM: PORTABLE CHEST 1 VIEW COMPARISON:  June 22, 2020. FINDINGS: Stable cardiomegaly. Stable position of left-sided chest tube. Moderate  left pleural effusion is noted which is significantly enlarged compared to prior exam. Atelectasis or infiltrate is noted in the remaining portion of the aerated left lung. Right lung is clear. No pneumothorax is noted. Bony thorax is unremarkable. IMPRESSION: Stable position of left-sided chest tube. Moderate left pleural effusion is noted which is significantly enlarged compared to prior exam. Atelectasis or infiltrate is noted in the remaining portion of the aerated left lung. Electronically Signed   By: Marijo Conception M.D.   On: 06/24/2020 08:43   DG Chest Port 1 View  Result Date: 06/22/2020 CLINICAL DATA:  Postop check.  Chest tube placement. EXAM: PORTABLE CHEST 1 VIEW COMPARISON:  Radiograph earlier this day.  Most recent CT 06/06/2019 FINDINGS: Pigtail catheter projects over the left mid lung. No visualized pneumothorax. Improved hazy opacity at the left lung base consistent with decreasing pleural effusion and  associated airspace disease/atelectasis. Small volume of pleural fluid and or thickening persists. Slight increase in right infrahilar atelectasis. Stable heart size and mediastinal contours. IMPRESSION: 1. Left pigtail catheter in place. No visualized pneumothorax. 2. Improved hazy opacity at the left lung base consistent with decreasing pleural effusion and associated airspace disease/atelectasis. Small volume of pleural fluid and or thickening persists. Electronically Signed   By: Keith Rake M.D.   On: 06/22/2020 15:44   DG Chest Portable 1 View  Result Date: 06/22/2020 CLINICAL DATA:  Chest tube placement EXAM: PORTABLE CHEST 1 VIEW COMPARISON:  June 22, 2020 study obtained earlier in the day FINDINGS: Chest tube present on the left with considerable diminution in size of left pleural effusion. There is a fairly small residual left pleural effusion with patchy atelectasis in the left mid and lower lung regions. Lungs elsewhere are clear. Heart is upper normal in size with pulmonary vascularity normal. No adenopathy. No pneumothorax. No bone lesions. IMPRESSION: Left pleural effusion considerably smaller after chest tube placement. There is a fairly small persistent left pleural effusion with atelectatic change in the left mid and lower lung regions. Lungs otherwise clear. Heart upper normal in size. Electronically Signed   By: Lowella Grip III M.D.   On: 06/22/2020 13:49   DG C-Arm 1-60 Min-No Report  Result Date: 06/29/2020 Fluoroscopy was utilized by the requesting physician.  No radiographic interpretation.   US THORACENTESIS ASP PLEURAL SPACE W/IMG GUIDE  Result Date: 06/05/2020 INDICATION: Patient with history of pneumonia with recurrent left-sided pleural effusion. Request is for therapeutic and diagnostic thoracentesis EXAM: ULTRASOUND GUIDED THERAPEUTIC AND DIAGNOSTIC THORACENTESIS MEDICATIONS: Lidocaine 1% 10 mL COMPLICATIONS: None immediate. PROCEDURE: An ultrasound guided  thoracentesis was thoroughly discussed with the patient and questions answered. The benefits, risks, alternatives and complications were also discussed. The patient understands and wishes to proceed with the procedure. Written consent was obtained. Ultrasound was performed to localize and mark an adequate pocket of fluid in the left chest. The area was then prepped and draped in the normal sterile fashion. 1% Lidocaine was used for local anesthesia. Under ultrasound guidance a 6 Fr Safe-T-Centesis catheter was introduced. Thoracentesis was performed. The catheter was removed and a dressing applied. FINDINGS: A total of approximately 1.8 L of serosanguineous fluid was removed. Samples were sent to the laboratory as requested by the clinical team. IMPRESSION: Successful ultrasound guided therapeutic and diagnostic left-sided thoracentesis yielding 1.8 L of pleural fluid. Read by: Rushie Nyhan, NP Electronically Signed   By: Jerilynn Mages.  Shick M.D.   On: 06/05/2020 15:57     Assessment and plan- Patient is a  74 y.o. male who presents to [facility] for initial meeting in preparation for starting chemotherapy for the treatment of extensive stage small cell lung cancer   1. Extensive stage small cell lung cancer-PET scan on 06/12/2020 revealed pleural-based disease hypermetabolic adenopathy and skeletal metastasis.  Pleural biopsy on 06/24/2020 confirmed small cell lung cancer.  Thoracic MRI reveals bony metastases from T9-10 through T12-L1 with invasion and involvement of adjacent T10 and T11 partially extending into left neural foramen at T11-12.  Additional metastatic lesions involving T1 and T10 vertebral bodies and pedicle of T6.  No evidence of intracranial metastatic disease on brain MRI.  Current plan for carboplatin-etoposide-Tecentriq every 3 weeks given with palliative, non-curative intent.   2. Chemo Care Clinic/High Risk for ER/Hospitalization during chemotherapy- We discussed the role of the chemo  care clinic and identified patient specific risk factors. I discussed that patient was identified as moderate risk primarily based on: age, previous hospital admissions and ER visits, and medical comorbidities.  3. Social Determinants of Health- we discussed that social determinants of health may have significant impacts on health and outcomes for cancer patients.  Today we discussed specific social determinants of performance status, alcohol use, depression, financial needs, food insecurity, housing, interpersonal violence, social connections, stress, tobacco use, and transportation.  We extensively discussed programs available at the cancer center including but not limited to care program, outpatient occupational and physical therapy, counseling services, outpatient palliative care, social work, Building surveyor, social and support groups, and smoking cessation, and transportation assistance.  Patient verbalizes need for assistance with managing Pleurx catheter which was placed during recent hospitalization.  I discussed with his nurse navigator who will meet with him on Monday.  May consider home palliative care for visits to the cancer center for assistance.  4. Palliative Care- based on stage of cancer and/or identified needs today, I will refer patient to palliative care for goals of care and advanced care planning.  He was seen by palliative care while in the hospital.  His wife who participates in the visit today is also diagnosed with recurrent breast cancer is currently receiving immunotherapy at [facility] lung [facility] center.  She expresses emotional turmoil over the above being diagnosed with advanced stage cancer and how to coordinate his care as well as hers.  Have sent a referral to outpatient palliative care.  We also discussed the role of the Symptom Management Clinic at [facility] for acute issues and methods of contacting clinic/provider. He denies needing specific assistance at this time and He will be  followed by Telford Nab RN (Nurse Navigator).   Return to clinic as needed.  Follow-up with Dr. Mike Gip as scheduled on Monday.  Visit Diagnosis 1. Small cell lung cancer ([place])      I discussed the assessment and treatment plan with the patient. The patient was provided an opportunity to ask questions and all were answered. The patient agreed with the plan and demonstrated an understanding of the instructions.   The patient was advised to call back or seek an in-person evaluation if the symptoms worsen or if the condition fails to improve as anticipated.   I provided 25 minutes of non face-to-face telephone visit time during this encounter, and > 50% was spent counseling as documented under my assessment & plan.  Beckey Rutter, DNP, AGNP-C [place] at [facility] [phone] (clinic)

## 2020-07-03 NOTE — Telephone Encounter (Signed)
Transition Care Management Follow-up Telephone Call  Date of discharge and from where: 07/02/20 [facility]  How have you been since you were released from the hospital? Pt states he is doing okay but still having pain and awaiting reply from Dr. Rosanna Randy regarding directions for pain medication oxycodone   Any questions or concerns? Yes - see telephone note from today regarding medication instructions  Items Reviewed:  Did the pt receive and understand the discharge instructions provided? Yes   Medications obtained and verified? Yes   Other? No   Any new allergies since your discharge? No   Dietary orders reviewed? Yes  Do you have support at home? Yes   Home Care and Equipment/Supplies: Were home health services ordered? No - pt's wife states that patient has pleural catheter and home health does not want to do that.   Were you able to get the supplies/equipment? Yes - pleural cath and oxygen  Do you have any questions related to the use of the equipment or supplies? No  Functional Questionnaire: (I = Independent and D = Dependent) ADLs: I with assistance  Bathing/Dressing- I with assistance  Meal Prep- D  Eating- I  Maintaining continence- I  Transferring/Ambulation- I  Managing Meds- I  Follow up appointments reviewed:   PCP Hospital f/u appt confirmed? Yes  Scheduled to see Dr. Rosanna Randy on 07/13/20 @ 8:20.  [facility] f/u appt confirmed? Yes  Scheduled to see Dr. Mike Gip today.  Are transportation arrangements needed? No   If their condition worsens, is the pt aware to call PCP or go to the Emergency Dept.? Yes  Was the patient provided with contact information for the PCP's office or ED? Yes  Was to pt encouraged to call back with questions or concerns? Yes

## 2020-07-06 ENCOUNTER — Encounter: Payer: Self-pay | Admitting: Hematology and Oncology

## 2020-07-06 ENCOUNTER — Other Ambulatory Visit: Payer: Self-pay

## 2020-07-06 ENCOUNTER — Inpatient Hospital Stay: Payer: Medicare Other

## 2020-07-06 ENCOUNTER — Inpatient Hospital Stay (HOSPITAL_BASED_OUTPATIENT_CLINIC_OR_DEPARTMENT_OTHER): Payer: Medicare Other | Admitting: Hematology and Oncology

## 2020-07-06 ENCOUNTER — Telehealth: Payer: Self-pay

## 2020-07-06 ENCOUNTER — Other Ambulatory Visit: Payer: Self-pay | Admitting: Hematology and Oncology

## 2020-07-06 ENCOUNTER — Encounter: Payer: Self-pay | Admitting: *Deleted

## 2020-07-06 VITALS — BP 102/58 | HR 92 | Temp 97.8°F | Wt 181.0 lb

## 2020-07-06 VITALS — BP 98/62 | HR 97

## 2020-07-06 DIAGNOSIS — Z5112 Encounter for antineoplastic immunotherapy: Secondary | ICD-10-CM | POA: Insufficient documentation

## 2020-07-06 DIAGNOSIS — C3490 Malignant neoplasm of unspecified part of unspecified bronchus or lung: Secondary | ICD-10-CM

## 2020-07-06 DIAGNOSIS — G893 Neoplasm related pain (acute) (chronic): Secondary | ICD-10-CM | POA: Diagnosis not present

## 2020-07-06 DIAGNOSIS — Z5111 Encounter for antineoplastic chemotherapy: Secondary | ICD-10-CM | POA: Diagnosis present

## 2020-07-06 DIAGNOSIS — I959 Hypotension, unspecified: Secondary | ICD-10-CM

## 2020-07-06 DIAGNOSIS — Z79899 Other long term (current) drug therapy: Secondary | ICD-10-CM | POA: Diagnosis not present

## 2020-07-06 DIAGNOSIS — J449 Chronic obstructive pulmonary disease, unspecified: Secondary | ICD-10-CM | POA: Diagnosis not present

## 2020-07-06 DIAGNOSIS — D649 Anemia, unspecified: Secondary | ICD-10-CM | POA: Insufficient documentation

## 2020-07-06 DIAGNOSIS — Z87891 Personal history of nicotine dependence: Secondary | ICD-10-CM | POA: Diagnosis not present

## 2020-07-06 DIAGNOSIS — C3492 Malignant neoplasm of unspecified part of left bronchus or lung: Secondary | ICD-10-CM | POA: Diagnosis present

## 2020-07-06 DIAGNOSIS — I10 Essential (primary) hypertension: Secondary | ICD-10-CM | POA: Diagnosis not present

## 2020-07-06 DIAGNOSIS — Z5189 Encounter for other specified aftercare: Secondary | ICD-10-CM | POA: Diagnosis not present

## 2020-07-06 DIAGNOSIS — C7951 Secondary malignant neoplasm of bone: Secondary | ICD-10-CM

## 2020-07-06 DIAGNOSIS — L89301 Pressure ulcer of unspecified buttock, stage 1: Secondary | ICD-10-CM

## 2020-07-06 DIAGNOSIS — R630 Anorexia: Secondary | ICD-10-CM

## 2020-07-06 DIAGNOSIS — E785 Hyperlipidemia, unspecified: Secondary | ICD-10-CM | POA: Diagnosis not present

## 2020-07-06 LAB — COMPREHENSIVE METABOLIC PANEL
ALT: 39 U/L (ref 0–44)
AST: 36 U/L (ref 15–41)
Albumin: 2.7 g/dL — ABNORMAL LOW (ref 3.5–5.0)
Alkaline Phosphatase: 56 U/L (ref 38–126)
Anion gap: 9 (ref 5–15)
BUN: 12 mg/dL (ref 8–23)
CO2: 23 mmol/L (ref 22–32)
Calcium: 8.6 mg/dL — ABNORMAL LOW (ref 8.9–10.3)
Chloride: 96 mmol/L — ABNORMAL LOW (ref 98–111)
Creatinine, Ser: 0.66 mg/dL (ref 0.61–1.24)
GFR, Estimated: >60 mL/min (ref >60)
Glucose, Bld: 99 mg/dL (ref 70–99)
Potassium: 3.8 mmol/L (ref 3.5–5.1)
Sodium: 128 mmol/L — ABNORMAL LOW (ref 135–145)
Total Bilirubin: 0.5 mg/dL (ref 0.3–1.2)
Total Protein: 6.5 g/dL (ref 6.5–8.1)

## 2020-07-06 LAB — FUNGUS CULTURE RESULT

## 2020-07-06 LAB — CBC WITH DIFFERENTIAL/PLATELET
Abs Immature Granulocytes: 0.2 10*3/uL — ABNORMAL HIGH (ref 0.00–0.07)
Basophils Absolute: 0 10*3/uL (ref 0.0–0.1)
Basophils Relative: 0 %
Eosinophils Absolute: 0.1 10*3/uL (ref 0.0–0.5)
Eosinophils Relative: 1 %
HCT: 28.6 % — ABNORMAL LOW (ref 39.0–52.0)
Hemoglobin: 9.9 g/dL — ABNORMAL LOW (ref 13.0–17.0)
Immature Granulocytes: 2 %
Lymphocytes Relative: 11 %
Lymphs Abs: 1 10*3/uL (ref 0.7–4.0)
MCH: 29.6 pg (ref 26.0–34.0)
MCHC: 34.6 g/dL (ref 30.0–36.0)
MCV: 85.4 fL (ref 80.0–100.0)
Monocytes Absolute: 1.2 10*3/uL — ABNORMAL HIGH (ref 0.1–1.0)
Monocytes Relative: 13 %
Neutro Abs: 6.5 10*3/uL (ref 1.7–7.7)
Neutrophils Relative %: 73 %
Platelets: 149 10*3/uL — ABNORMAL LOW (ref 150–400)
RBC: 3.35 MIL/uL — ABNORMAL LOW (ref 4.22–5.81)
RDW: 12.5 % (ref 11.5–15.5)
WBC: 9 10*3/uL (ref 4.0–10.5)
nRBC: 0 % (ref 0.0–0.2)

## 2020-07-06 LAB — MAGNESIUM: Magnesium: 1.7 mg/dL (ref 1.7–2.4)

## 2020-07-06 LAB — FUNGUS CULTURE WITH STAIN

## 2020-07-06 LAB — FUNGAL ORGANISM REFLEX

## 2020-07-06 LAB — TSH: TSH: 1.679 u[IU]/mL (ref 0.350–4.500)

## 2020-07-06 MED ORDER — HEPARIN SOD (PORK) LOCK FLUSH 100 UNIT/ML IV SOLN
500.0000 [IU] | Freq: Once | INTRAVENOUS | Status: AC | PRN
  Administered 2020-07-06: 500 [IU]
  Filled 2020-07-06: qty 5

## 2020-07-06 MED ORDER — SODIUM CHLORIDE 0.9 % IV SOLN
Freq: Once | INTRAVENOUS | Status: AC
  Filled 2020-07-06: qty 250

## 2020-07-06 MED ORDER — ONDANSETRON HCL 8 MG PO TABS: 8.0000 mg | ORAL_TABLET | Freq: Two times a day (BID) | ORAL | 1 refills | Status: DC | PRN

## 2020-07-06 MED ORDER — SODIUM CHLORIDE 0.9 % IV SOLN
523.0000 mg | Freq: Once | INTRAVENOUS | Status: AC
  Administered 2020-07-06: 520 mg via INTRAVENOUS
  Filled 2020-07-06: qty 52

## 2020-07-06 MED ORDER — SODIUM CHLORIDE 0.9% FLUSH
10.0000 mL | INTRAVENOUS | Status: DC | PRN
  Administered 2020-07-06: 10 mL
  Filled 2020-07-06: qty 10

## 2020-07-06 MED ORDER — SODIUM CHLORIDE 0.9 % IV SOLN
10.0000 mg | Freq: Once | INTRAVENOUS | Status: AC
  Administered 2020-07-06: 10 mg via INTRAVENOUS
  Filled 2020-07-06: qty 10

## 2020-07-06 MED ORDER — OXYCODONE HCL 5 MG PO TABS
5.0000 mg | ORAL_TABLET | ORAL | 0 refills | Status: DC | PRN
Start: 2020-07-06 — End: 2020-07-09

## 2020-07-06 MED ORDER — SODIUM CHLORIDE 0.9 % IV SOLN
150.0000 mg | Freq: Once | INTRAVENOUS | Status: AC
  Administered 2020-07-06: 150 mg via INTRAVENOUS
  Filled 2020-07-06: qty 150

## 2020-07-06 MED ORDER — PALONOSETRON HCL INJECTION 0.25 MG/5ML
0.2500 mg | Freq: Once | INTRAVENOUS | Status: AC
  Administered 2020-07-06: 0.25 mg via INTRAVENOUS
  Filled 2020-07-06: qty 5

## 2020-07-06 MED ORDER — SODIUM CHLORIDE 0.9 % IV SOLN
1200.0000 mg | Freq: Once | INTRAVENOUS | Status: AC
  Administered 2020-07-06: 1200 mg via INTRAVENOUS
  Filled 2020-07-06: qty 20

## 2020-07-06 MED ORDER — SODIUM CHLORIDE 0.9 % IV SOLN
65.0000 mg/m2 | Freq: Once | INTRAVENOUS | Status: AC
  Administered 2020-07-06: 140 mg via INTRAVENOUS
  Filled 2020-07-06: qty 7

## 2020-07-06 MED ORDER — PROCHLORPERAZINE MALEATE 10 MG PO TABS: 10.0000 mg | ORAL_TABLET | Freq: Four times a day (QID) | ORAL | 1 refills | Status: DC | PRN

## 2020-07-06 NOTE — Telephone Encounter (Signed)
Spoke with patients wife regarding pleurx catheter drainage- patient sister is RM and doing well, however when drained yesterday only 25cc- patient said she was instructed to skip a day or so and then drain.  Spoke with [place] at [place] - patient to call [phone] and pay copay and supplies will be shipped. Made nurse visit appointment for Friday to check pleurx catheter. 07/10/20 @ 9am.

## 2020-07-06 NOTE — Progress Notes (Signed)
Patient c/o increase pain at times to left side at drainage.

## 2020-07-06 NOTE — Patient Instructions (Signed)
Megestrol oral suspension What is this medicine? MEGESTROL (me JES trol) suspension improves appetite and helps cause weight gain. It is used in conditions that cause significant loss of appetite and weight, such as AIDS. This medicine may be used for other purposes; ask your health care provider or pharmacist if you have questions. COMMON BRAND NAME(S): Megace, Megace ES What should I tell my health care provider before I take this medicine? They need to know if you have any of these conditions:  adrenal gland problems  history of blood clots of the legs, lungs, or other parts of the body  diabetes  kidney disease  liver disease  stroke  an unusual or allergic reaction to megestrol, other medicines, foods, dyes, or preservatives  pregnant or trying to get pregnant  breast-feeding How should I use this medicine? Take this medicine by mouth. Follow the directions on the prescription label. Shake well before using. Use a specially marked spoon or container to measure your medicine. Ask your pharmacist if you do not have one. Household spoons are not accurate. Take your medicine at regular intervals. Do not take your medicine more often than directed. Do not stop taking except on your doctor's advice. Contact your pediatrician or health care provider regarding the use of this medicine in children. Special care may be needed. Overdosage: If you think you have taken too much of this medicine contact a poison control center or emergency room at once. NOTE: This medicine is only for you. Do not share this medicine with others. What if I miss a dose? If you miss or forget a single dose, continue with your next regularly scheduled dose on the next day. Do not take double or extra doses. What may interact with this medicine? Do not take this medicine with any of the following medications:  dofetilide This medicine may also interact with the following medications:  indinavir This list  may not describe all possible interactions. Give your health care provider a list of all the medicines, herbs, non-prescription drugs, or dietary supplements you use. Also tell them if you smoke, drink alcohol, or use illegal drugs. Some items may interact with your medicine. What should I watch for while using this medicine? Visit your doctor or health care professional for regular checks on your progress. It may take up to 3 weeks to first notice an improved appetite. It may take up to 12 weeks to notice weight gain. This medicine can cause birth defects. Do not get pregnant while taking this drug. Females with child-bearing potential will need to have a negative pregnancy test before starting this medicine. Use an effective method of birth control while you are taking this medicine. If you think that you might be pregnant talk to your doctor right away. This medicine may affect blood sugar levels. If you have diabetes, check with your doctor or health care professional before you change your diet or the dose of your diabetic medicine. What side effects may I notice from receiving this medicine? Side effects that you should report to your doctor or health care professional as soon as possible:  allergic reactions like skin rash, itching or hives, swelling of the face, lips, or tongue  breathing problems  dizziness  increased blood pressure  signs and symptoms of a blood clot such as breathing problems; changes in vision; chest pain; severe, sudden headache; pain, swelling, warmth in the leg; trouble speaking; sudden numbness or weakness of the face, arm or leg  swelling of the  ankles, feet, hands  unusually weak or tired  vomiting Side effects that usually do not require medical attention (report to your doctor or health care professional if they continue or are bothersome):  breakthrough menstrual bleeding  changes in sex drive or performance  diarrhea  gas  hot flashes or  flushing  increased appetite  upset stomach  weight gain This list may not describe all possible side effects. Call your doctor for medical advice about side effects. You may report side effects to FDA at 1-800-FDA-1088. Where should I keep my medicine? Keep out of the reach of children. Store at room temperature between 15 and 25 degrees C (59 and 77 degrees F). Keep tightly closed. Protect from heat. Throw away any unused medicine after the expiration date. NOTE: This sheet is a summary. It may not cover all possible information. If you have questions about this medicine, talk to your doctor, pharmacist, or health care provider.  2020 Elsevier/Gold Standard (2017-09-13 10:06:45)

## 2020-07-06 NOTE — Progress Notes (Signed)
  Oncology Nurse Navigator Documentation  Navigator Location: [facility] (07/06/20 0900)   )Navigator Encounter Type: Follow-up Appt;Treatment (07/06/20 0900)   Abnormal Finding Date: 06/05/20 (07/06/20 0900) Confirmed Diagnosis Date: 06/26/20 (07/06/20 0900)             Treatment Initiated Date: 07/06/20 (07/06/20 0900) Patient Visit Type: MedOnc (07/06/20 0900) Treatment Phase: First Chemo Tx (07/06/20 0900) Barriers/Navigation Needs: Coordination of Care;Morbidities/Frailty;Family Concerns;Education (07/06/20 0900) Education: Newly Diagnosed Cancer Education;Pain/ Symptom Management;Understanding Cancer/ Treatment Options (07/06/20 0900) Interventions: Coordination of Care;Education;Psycho-Social Support (07/06/20 0900)   Coordination of Care: Appts;Chemo (07/06/20 0900) Education Method: Verbal;Written (07/06/20 0900)      Acuity: Level 3-Moderate Needs (3-4 Barriers Identified) (07/06/20 0900)      met with patient and his wife during follow up visit with Dr. Mike Gip before starting chemotherapy treatment. All questions answered during visit. Pt's wife has several concerns and states is feeling overwhelmed by having to support her husband through his treatment as well as focus on her treatment for metastatic breast cancer. Reassurance provided and informed that we are here to help. Resources provided regarding diagnosis and supportive services available. Pt's wife stated that they need additional supplies for pleurx catheter as well as dressings for pressure ulcer. Informed pt's wife to contact surgeon's office that placed his catheter to order more supplies. Also, informed that I will get her more dressings to use for pt's pressure ulcer. Nothing further needed at this time. Contact info given and instructed to call with any questions or needs. Pt and his wife verbalized understanding.   Time Spent with Patient: 60 (07/06/20 0900)

## 2020-07-07 ENCOUNTER — Inpatient Hospital Stay: Payer: Medicare Other

## 2020-07-07 ENCOUNTER — Other Ambulatory Visit: Payer: Self-pay | Admitting: Hematology and Oncology

## 2020-07-07 ENCOUNTER — Telehealth: Payer: Self-pay

## 2020-07-07 ENCOUNTER — Other Ambulatory Visit: Payer: Self-pay

## 2020-07-07 ENCOUNTER — Encounter: Payer: Self-pay | Admitting: *Deleted

## 2020-07-07 VITALS — BP 98/55 | HR 90 | Temp 96.4°F | Resp 18

## 2020-07-07 DIAGNOSIS — R634 Abnormal weight loss: Secondary | ICD-10-CM

## 2020-07-07 DIAGNOSIS — C3490 Malignant neoplasm of unspecified part of unspecified bronchus or lung: Secondary | ICD-10-CM

## 2020-07-07 DIAGNOSIS — Z5112 Encounter for antineoplastic immunotherapy: Secondary | ICD-10-CM | POA: Diagnosis not present

## 2020-07-07 LAB — BASIC METABOLIC PANEL
Anion gap: 7 (ref 5–15)
BUN: 16 mg/dL (ref 8–23)
CO2: 24 mmol/L (ref 22–32)
Calcium: 7.9 mg/dL — ABNORMAL LOW (ref 8.9–10.3)
Chloride: 96 mmol/L — ABNORMAL LOW (ref 98–111)
Creatinine, Ser: 0.72 mg/dL (ref 0.61–1.24)
GFR, Estimated: >60 mL/min (ref >60)
Glucose, Bld: 110 mg/dL — ABNORMAL HIGH (ref 70–99)
Potassium: 3.8 mmol/L (ref 3.5–5.1)
Sodium: 127 mmol/L — ABNORMAL LOW (ref 135–145)

## 2020-07-07 LAB — T4: T4, Total: 7.4 ug/dL (ref 4.5–12.0)

## 2020-07-07 MED ORDER — MEGESTROL ACETATE 400 MG/10ML PO SUSP: 200.0000 mg | Freq: Every day | ORAL | 0 refills | Status: DC

## 2020-07-07 MED ORDER — SODIUM CHLORIDE 0.9 % IV SOLN
Freq: Once | INTRAVENOUS | Status: AC
  Filled 2020-07-07: qty 250

## 2020-07-07 MED ORDER — HEPARIN SOD (PORK) LOCK FLUSH 100 UNIT/ML IV SOLN
500.0000 [IU] | Freq: Once | INTRAVENOUS | Status: AC | PRN
  Administered 2020-07-07: 500 [IU]
  Filled 2020-07-07: qty 5

## 2020-07-07 MED ORDER — SODIUM CHLORIDE 0.9 % IV SOLN
65.0000 mg/m2 | Freq: Once | INTRAVENOUS | Status: AC
  Administered 2020-07-07: 140 mg via INTRAVENOUS
  Filled 2020-07-07: qty 7

## 2020-07-07 MED ORDER — ONDANSETRON HCL 8 MG PO TABS: 8.0000 mg | ORAL_TABLET | Freq: Two times a day (BID) | ORAL | 1 refills | Status: DC | PRN

## 2020-07-07 MED ORDER — SODIUM CHLORIDE 0.9 % IV SOLN
10.0000 mg | Freq: Once | INTRAVENOUS | Status: AC
  Administered 2020-07-07: 10 mg via INTRAVENOUS
  Filled 2020-07-07: qty 10

## 2020-07-07 MED ORDER — PROCHLORPERAZINE MALEATE 10 MG PO TABS: 10.0000 mg | ORAL_TABLET | Freq: Four times a day (QID) | ORAL | 1 refills | Status: DC | PRN

## 2020-07-07 NOTE — Progress Notes (Signed)
  Oncology Nurse Navigator Documentation  Navigator Location: [facility] (07/07/20 1400)   )Navigator Encounter Type: Lobby (07/07/20 1400)                       Treatment Phase: Active Tx (07/07/20 1400) Barriers/Navigation Needs: Family Concerns (07/07/20 1400)   Interventions: Medication Assistance;Psycho-Social Support (07/07/20 1400)         met with pt's wife in the lobby while pt was receiving infusion. All questions answered during visit. Pt's wife stated that they are interested in starting appetite stimulant if Dr. Mike Gip could send in the prescription. MD made aware. Also, pt's wife stated that [facility] did not receive [facility] for nausea medications. Chart reviewed and prescription for zofran and compazine [facility]. Both [facility] [place]. Pt's wife updated and instructed to call with any further questions or needs. She verbalized understanding.              Time Spent with Patient: 30 (07/07/20 1400)

## 2020-07-07 NOTE — Telephone Encounter (Signed)
T/C to pt for follow up after receiving first chemo yesterday but no answer.   Left message letting pt know we were calling to check on him and to call for any questions or concerns.

## 2020-07-08 ENCOUNTER — Other Ambulatory Visit: Payer: Self-pay | Admitting: Hematology and Oncology

## 2020-07-08 ENCOUNTER — Other Ambulatory Visit: Payer: Self-pay

## 2020-07-08 ENCOUNTER — Telehealth: Payer: Self-pay

## 2020-07-08 ENCOUNTER — Inpatient Hospital Stay: Payer: Medicare Other

## 2020-07-08 VITALS — BP 96/52 | HR 88 | Resp 18

## 2020-07-08 DIAGNOSIS — E871 Hypo-osmolality and hyponatremia: Secondary | ICD-10-CM

## 2020-07-08 DIAGNOSIS — Z5112 Encounter for antineoplastic immunotherapy: Secondary | ICD-10-CM | POA: Diagnosis not present

## 2020-07-08 DIAGNOSIS — C3490 Malignant neoplasm of unspecified part of unspecified bronchus or lung: Secondary | ICD-10-CM

## 2020-07-08 MED ORDER — SODIUM CHLORIDE 0.9 % IV SOLN
10.0000 mg | Freq: Once | INTRAVENOUS | Status: AC
  Administered 2020-07-08: 10 mg via INTRAVENOUS
  Filled 2020-07-08: qty 10

## 2020-07-08 MED ORDER — SODIUM CHLORIDE 0.9 % IV SOLN
INTRAVENOUS | Status: DC
  Filled 2020-07-08: qty 250

## 2020-07-08 MED ORDER — SODIUM CHLORIDE 0.9 % IV SOLN
65.0000 mg/m2 | Freq: Once | INTRAVENOUS | Status: AC
  Administered 2020-07-08: 140 mg via INTRAVENOUS
  Filled 2020-07-08: qty 7

## 2020-07-08 NOTE — Telephone Encounter (Signed)
Copied from [place] [id number]. Topic: General - Other >> Jul 02, 2020  9:18 AM Rainey Pines A wrote: [facility] called to see if patient can be worked in sooner than 10/25 for hospital follow up with Dr. Rosanna Randy.

## 2020-07-08 NOTE — Addendum Note (Signed)
Addended by: Vito Berger on: 07/08/2020 10:58 AM   Modules accepted: Orders

## 2020-07-08 NOTE — Telephone Encounter (Signed)
Called and rescheduled pt's appt for tomorrow at 1L40 pm.

## 2020-07-09 ENCOUNTER — Telehealth: Payer: Self-pay

## 2020-07-09 ENCOUNTER — Encounter: Payer: Self-pay | Admitting: Family Medicine

## 2020-07-09 ENCOUNTER — Inpatient Hospital Stay: Payer: Medicare Other

## 2020-07-09 ENCOUNTER — Ambulatory Visit (INDEPENDENT_AMBULATORY_CARE_PROVIDER_SITE_OTHER): Payer: Medicare Other | Admitting: Family Medicine

## 2020-07-09 VITALS — BP 86/50 | HR 80 | Temp 98.4°F | Resp 18 | Wt 185.0 lb

## 2020-07-09 VITALS — BP 94/57 | HR 86 | Temp 97.0°F | Resp 17

## 2020-07-09 DIAGNOSIS — N179 Acute kidney failure, unspecified: Secondary | ICD-10-CM | POA: Diagnosis not present

## 2020-07-09 DIAGNOSIS — G893 Neoplasm related pain (acute) (chronic): Secondary | ICD-10-CM | POA: Diagnosis not present

## 2020-07-09 DIAGNOSIS — Z5112 Encounter for antineoplastic immunotherapy: Secondary | ICD-10-CM | POA: Diagnosis not present

## 2020-07-09 DIAGNOSIS — C3490 Malignant neoplasm of unspecified part of unspecified bronchus or lung: Secondary | ICD-10-CM | POA: Diagnosis not present

## 2020-07-09 DIAGNOSIS — D500 Iron deficiency anemia secondary to blood loss (chronic): Secondary | ICD-10-CM

## 2020-07-09 DIAGNOSIS — K7030 Alcoholic cirrhosis of liver without ascites: Secondary | ICD-10-CM

## 2020-07-09 DIAGNOSIS — J942 Hemothorax: Secondary | ICD-10-CM

## 2020-07-09 MED ORDER — PEGFILGRASTIM INJECTION 6 MG/0.6ML ~~LOC~~
6.0000 mg | PREFILLED_SYRINGE | Freq: Once | SUBCUTANEOUS | Status: AC
  Administered 2020-07-09: 6 mg via SUBCUTANEOUS
  Filled 2020-07-09: qty 0.6

## 2020-07-09 MED ORDER — OXYCODONE HCL 15 MG PO TABS: 15.0000 mg | ORAL_TABLET | ORAL | 0 refills | Status: DC | PRN

## 2020-07-09 NOTE — Telephone Encounter (Signed)
Faxed new patient referral to Dr Holley Raring office for re: Hyponatermia. Fax conformation confirmed

## 2020-07-09 NOTE — Progress Notes (Signed)
I,April Miller,acting as a scribe for [name], MD.,have documented all relevant documentation on the behalf of [name], MD,as directed by  [name], MD while in the presence of [name], MD.   Established patient visit   Patient: Dwayne Thompson   DOB: 10-May-1946   74 y.o. Male  MRN: [id number] Visit Date: 07/09/2020  Today's healthcare provider: [name], MD   No chief complaint on file.  Subjective    HPI  Patient's pain is somewhat better controlled on narcotics.  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  His wife also has cancer.  They are both here together. Follow up Hospitalization  Patient was admitted to [facility] on 06/22/2020 and discharged on 07/03/2020. He was treated for; Shortness of Breath and Chest Pain. Treatment for this included; see notes chart. Telephone follow up was done on none He reports good compliance with treatment. He reports this condition is improved.  --------------------------------------------------------------------  Patient states he feels somewhat better but still feeling unwell.   Past Medical History:  Diagnosis Date  . Anemia   . Back pain   . BPH (benign prostatic hyperplasia)   . Cirrhosis ([place])   . COPD (chronic obstructive pulmonary disease) ([place])   . ED (erectile dysfunction)   . GERD (gastroesophageal reflux disease)   . Hyperlipidemia   . Hypertension        Medications: Outpatient Medications Prior to Visit  Medication Sig  . acetaminophen (TYLENOL) 325 MG tablet Take 2 tablets (650 mg total) by mouth 3 (three) times daily. (Patient taking differently: Take 650 mg by mouth every 6 (six) hours as needed for moderate pain. )  . cetirizine (ZYRTEC) 10 MG tablet Take 1 tablet (10 mg total) by mouth daily.  . cholecalciferol (VITAMIN D3) 25 MCG (1000 UNIT) tablet Take 1,000 Units by mouth daily.  . diclofenac Sodium (VOLTAREN) 1 % GEL Apply 1  application topically 4 (four) times daily as needed (pain).  Marland Kitchen dutasteride (AVODART) 0.5 MG capsule Take 0.5 mg by mouth daily.  . feeding supplement (ENSURE ENLIVE / ENSURE PLUS) LIQD Take 237 mLs by mouth 2 (two) times daily between meals.  . folic acid (FOLVITE) 1 MG tablet Take 1 tablet (1 mg total) by mouth daily.  Marland Kitchen lactulose (CHRONULAC) 10 GM/15ML solution Take 45 mLs (30 g total) by mouth 2 (two) times daily. (Patient not taking: Reported on 07/06/2020)  . lidocaine (LIDODERM) 5 % Place 1 patch onto the skin daily. Remove & Discard patch within 12 hours or as directed by MD  . Lidocaine (SALONPAS PAIN RELIEVING) 4 % PTCH Apply 1 patch topically every morning.  . megestrol (MEGACE) 40 MG/ML suspension Take 5 mLs (200 mg total) by mouth daily.  . midodrine (PROAMATINE) 10 MG tablet Take 1 tablet (10 mg total) by mouth 4 (four) times daily.  . montelukast (SINGULAIR) 10 MG tablet TAKE 1 TABLET BY MOUTH EVERY DAY (Patient taking differently: Take 10 mg by mouth daily. )  . ondansetron (ZOFRAN) 8 MG tablet Take 1 tablet (8 mg total) by mouth 2 (two) times daily as needed for refractory nausea / vomiting. Start on day 3 after carboplatin chemo.  Marland Kitchen oxyCODONE (ROXICODONE) 5 MG immediate release tablet Take 1 tablet (5 mg total) by mouth every 4 (four) hours as needed for severe pain.  . pantoprazole (PROTONIX) 20 MG tablet Take 20 mg by mouth 2 (two) times daily.  Marland Kitchen  polyethylene glycol (MIRALAX / GLYCOLAX) 17 g packet Take 17 g by mouth daily as needed for moderate constipation.  . potassium chloride (K-DUR,KLOR-CON) 10 MEQ tablet Take 1 tablet (10 mEq total) by mouth 2 (two) times daily.  . prochlorperazine (COMPAZINE) 10 MG tablet Take 1 tablet (10 mg total) by mouth every 6 (six) hours as needed (Nausea or vomiting).  . rosuvastatin (CRESTOR) 10 MG tablet Take 1 tablet (10 mg total) by mouth daily.  . vitamin B-12 1000 MCG tablet Take 1 tablet (1,000 mcg total) by mouth daily.    Facility-Administered Medications Prior to Visit  Medication Dose Route Frequency Provider  . sodium chloride flush (NS) 0.9 % injection 10 mL  10 mL Intracatheter PRN Lequita Asal, MD    Review of Systems  Constitutional: Negative for appetite change, chills and fever.  Respiratory: Negative for chest tightness, shortness of breath and wheezing.   Cardiovascular: Negative for chest pain and palpitations.  Gastrointestinal: Negative for abdominal pain, nausea and vomiting.       Objective    There were no vitals taken for this visit. BP Readings from Last 3 Encounters:  07/15/20 (!) 90/53  07/13/20 106/67  07/09/20 (!) 94/57   Wt Readings from Last 3 Encounters:  07/15/20 176 lb 5.9 oz (80 kg)  07/13/20 178 lb (80.7 kg)  07/09/20 185 lb (83.9 kg)      Physical Exam Vitals reviewed.  Constitutional:      Appearance: Normal appearance.  HENT:     Right Ear: External ear normal.     Left Ear: External ear normal.  Eyes:     General: No scleral icterus.    Conjunctiva/sclera: Conjunctivae normal.  Cardiovascular:     Rate and Rhythm: Normal rate and regular rhythm.     Pulses: Normal pulses.     Heart sounds: Normal heart sounds.  Pulmonary:     Effort: Pulmonary effort is normal.     Comments: Decreased breath sounds in the left base Musculoskeletal:     Right lower leg: No edema.     Left lower leg: No edema.  Skin:    General: Skin is warm and dry.  Neurological:     General: No focal deficit present.     Mental Status: He is alert and oriented to person, place, and time.  Psychiatric:        Mood and Affect: Mood normal.        Behavior: Behavior normal.        Thought Content: Thought content normal.        Judgment: Judgment normal.       No results found for any visits on 07/09/20.  Assessment & Plan     1. Cancer-related pain Increase oxycodone the next dose of 50 mg every 4 hours as needed.  Discussed pain relief expectations during  the visit. - oxyCODONE (ROXICODONE) 15 MG immediate release tablet; Take 1 tablet (15 mg total) by mouth every 4 (four) hours as needed for pain.  Dispense: 120 tablet; Refill: 0  2. Small cell lung cancer [facility]) Per oncology on palliative therapy  3. AKI (acute kidney injury) ([place]) Follow-up labs  4. Iron deficiency anemia due to chronic blood loss Due to hemothorax  5. Hemothorax Followed by thoracic surgery  6. Alcoholic cirrhosis of liver without ascites ([facility])    No follow-ups on file.         Dwayne Thompson Mon, MD  [facility] [phone] (phone) [phone] (  fax)  [place]

## 2020-07-10 ENCOUNTER — Ambulatory Visit (INDEPENDENT_AMBULATORY_CARE_PROVIDER_SITE_OTHER): Payer: Medicare Other

## 2020-07-10 ENCOUNTER — Other Ambulatory Visit: Payer: Self-pay

## 2020-07-10 DIAGNOSIS — J90 Pleural effusion, not elsewhere classified: Secondary | ICD-10-CM

## 2020-07-10 NOTE — Patient Instructions (Addendum)
You may take 3 extra strength Tylenol every 6 hours and rotate this with the Oxycodone for pain control. Continue to drain the Pleurx catheter every 2-3 days. We will see you here on Monday with Dr Genevive Bi at 9:30 am.

## 2020-07-10 NOTE — Progress Notes (Signed)
Patient came in today for port placement check and suture removal. Sutures removed from port placement sites. The patient states that they have been draining his Pleurx catheter every 2-3 days and only getting a little less than 75 cc. He is scheduled for a post op visit with Dr Genevive Bi on Monday. He reports no shortness of breath.

## 2020-07-13 ENCOUNTER — Encounter: Payer: Self-pay | Admitting: Cardiothoracic Surgery

## 2020-07-13 ENCOUNTER — Ambulatory Visit (INDEPENDENT_AMBULATORY_CARE_PROVIDER_SITE_OTHER): Payer: Medicare Other | Admitting: Cardiothoracic Surgery

## 2020-07-13 ENCOUNTER — Ambulatory Visit
Admission: RE | Admit: 2020-07-13 | Discharge: 2020-07-13 | Disposition: A | Discharge status: Home / Self Care | Payer: Medicare Other | Source: Ambulatory Visit | Attending: Cardiothoracic Surgery | Admitting: Cardiothoracic Surgery

## 2020-07-13 ENCOUNTER — Other Ambulatory Visit: Payer: Self-pay

## 2020-07-13 ENCOUNTER — Inpatient Hospital Stay: Payer: Medicare Other | Admitting: Family Medicine

## 2020-07-13 ENCOUNTER — Ambulatory Visit
Admission: RE | Admit: 2020-07-13 | Discharge: 2020-07-13 | Disposition: A | Discharge status: Home / Self Care | Payer: Medicare Other | Attending: Cardiothoracic Surgery | Admitting: Cardiothoracic Surgery

## 2020-07-13 VITALS — BP 106/67 | HR 99 | Temp 97.8°F | Resp 14 | Wt 178.0 lb

## 2020-07-13 DIAGNOSIS — J90 Pleural effusion, not elsewhere classified: Secondary | ICD-10-CM

## 2020-07-13 DIAGNOSIS — C3490 Malignant neoplasm of unspecified part of unspecified bronchus or lung: Secondary | ICD-10-CM

## 2020-07-13 NOTE — Patient Instructions (Addendum)
Keep your appointment With Dr Lanny Hurst for Wednesday 10/27. Drain the pleurx catheter this Wednesday and Friday. We will see you on Monday. See your appointment below.  Change the gauze for the bed sore once a day. Apply triple antibiotic ointment to the area and cover.   You will need an Xray prior to this appointment.

## 2020-07-13 NOTE — Progress Notes (Signed)
che

## 2020-07-13 NOTE — Progress Notes (Signed)
[facility] Follow Up Note  Patient ID: TAMAJ JURGENS, male   DOB: 11-Jan-1946, 74 y.o.   MRN: [id number]  HISTORY: Mr. Cossin comes in today without any specific complaints.  He states that his breathing is somewhat better than before his surgical procedure.  His saturations were in the mid 80s.  He does have oxygen with the left in the car.  We placed him on our oxygen at 2 L a minute and his oxygen saturations came up into the 90s.  He has been draining his Pleurx catheter every 2 to 3 days and been only able to achieve about 20 cc.  His chest x-ray today was independently reviewed by me and shows a small loculated left pleural effusion with a combination of tumor and atelectasis.  In addition he has an intergluteal fold open wound.  This is linear measuring about 4 mm in width and about 1.5 cm in length.    Vitals:   07/13/20 0942 07/13/20 1005  BP: 106/67   Pulse: 99   Resp: 14   Temp: 97.8 F (36.6 C)   SpO2: (!) 83% 92%     EXAM:  Resp: Lungs are clear bilaterally.  No respiratory distress, normal effort. Heart:  Regular without murmurs Abd:  Abdomen is soft, non distended and non tender. No masses are palpable.  There is no rebound and no guarding.  Neurological: Alert and oriented to person, place, and time. Coordination normal.  Skin: Skin is warm and dry. No rash noted. No diaphoretic. No erythema. No pallor.  Psychiatric: Normal mood and affect. Normal behavior. Judgment and thought content normal.    Independent review of the chest x-ray shows a combination of fluid atelectasis and tumor at the left base.  Overall it is much improved.  The intergluteal fold was inspected and there is a linear skin opening measuring about 2 cm in length and 4 mm in width.  This was treated with some Neosporin.  Instructions were given on changing this daily.  All the sutures were removed.  His wounds are healing as expected.  ASSESSMENT: #1 malignant pleural effusion #2 pressure sore intergluteal  fold   PLAN:   I asked that they drain the catheter Monday Wednesdays and Fridays.  If there is minimal drainage from the catheter we will remove it next week.  I did make an appointment to come back in 1 week for his Pleurx catheter removal.  They are all in agreement.  We also instructed him on daily showers to clean the area in the intergluteal fold and place Neosporin or bacitracin ointment on this.  All questions were answered.    [name], MD

## 2020-07-14 NOTE — Progress Notes (Signed)
[facility]  [address], [address] [place], [place] [address] Phone: [phone]  Fax: [phone]   Office Visit:  07/15/20  Referring physician: Jerrol Banana.,*  Chief Complaint: Dwayne Thompson is a 74 y.o. male with extensive stage small cell lung cancer who is seen for nadir assessment on day 10 of cycle #1 carboplatin, etoposide, and Tecentriq.  HPI:  The patient was last seen in the medical oncology clinic on 07/06/2020. At that time, he felt "fair".  Appetite was poor; he had constipation.  He noted shortness of breath on exertion.  He was on 2 liters/min oxygen via nasal cannula.  His Pleurex catheter was draining only 25 cc/day.  Pain was controlled with oxycodone.  Blood pressure was chronically low on midodrine.  Hematocrit was 28.6, hemoglobin 9.9, MCV 85.4, platelets 149,000, WBC 9,000. Sodium was 128. Calcium was 8.6 and albumin 2.7. TSH was 1.679 and free T4 was 7.4. Magnesium was 1.7. He received IVF.  He began cycle #1 carboplatin, etoposide and Tecentriq. He received Neulasta on 07/09/2020.  The patient saw Dr. Genevive Bi on 07/13/2020. His breathing was somewhat better. He had been draining his Pleurx catheter every 2-3 days. Plan was to remove the catheter in 1 week if he still had minimal drainage.  During the interim, he has been "pretty good." He is not in as much pain. He has been taking Megace for 4 days and his appetite has improved a little bit. He reports constipation. His systolic blood pressures have been in the 90s. His legs are swollen. He denies any mouth sores or tenderness.  He states that his chemotherapy went well until the third day. He started experiencing nausea and hiccups. He did not take any Zofran because they were not able to pick it up until after the nausea went away.  The past few times that he has drained his Pleurx catheter; there has been about a tablespoon of fluid. However, it drained 100 cc today. The last  time it was drained before today was 3 days ago.  The patient struggles to complete his ADLs on his own and needs help from his wife. He is unable to bend over stand for long periods of time without holding on to something.   Past Medical History:  Diagnosis Date  . Anemia   . Back pain   . BPH (benign prostatic hyperplasia)   . Cirrhosis ([place])   . COPD (chronic obstructive pulmonary disease) ([place])   . ED (erectile dysfunction)   . GERD (gastroesophageal reflux disease)   . Hyperlipidemia   . Hypertension    Past Surgical History:  Procedure Laterality Date  . COLONOSCOPY Left 01/03/2018   Procedure: COLONOSCOPY;  Surgeon: Virgel Manifold, MD;  Location: [facility] ENDOSCOPY;  Service: Endoscopy;  Laterality: Left;  . COLONOSCOPY WITH PROPOFOL N/A 04/05/2018   Procedure: COLONOSCOPY WITH PROPOFOL;  Surgeon: Virgel Manifold, MD;  Location: [facility] ENDOSCOPY;  Service: Endoscopy;  Laterality: N/A;  . CYSTOSCOPY WITH STENT PLACEMENT Right 09/26/2019   Procedure: CYSTOSCOPY WITH STENT PLACEMENT;  Surgeon: Royston Cowper, MD;  Location: [facility] ORS;  Service: Urology;  Laterality: Right;  . ENTEROSCOPY  01/03/2018   Procedure: ENTEROSCOPY;  Surgeon: Virgel Manifold, MD;  Location: [facility] ENDOSCOPY;  Service: Endoscopy;;  . ESOPHAGOGASTRODUODENOSCOPY N/A 01/02/2018   Procedure: ESOPHAGOGASTRODUODENOSCOPY (EGD);  Surgeon: Virgel Manifold, MD;  Location: [facility] ENDOSCOPY;  Service: Endoscopy;  Laterality: N/A;  . ESOPHAGOGASTRODUODENOSCOPY (EGD) WITH PROPOFOL N/A 04/05/2018  Procedure: ESOPHAGOGASTRODUODENOSCOPY (EGD) WITH PROPOFOL;  Surgeon: Virgel Manifold, MD;  Location: [facility] ENDOSCOPY;  Service: Endoscopy;  Laterality: N/A;  . exploration and neurolysis of right peroneal nerve at the knee Right 08/27/2018  . GREEN LIGHT LASER TURP (TRANSURETHRAL RESECTION OF PROSTATE N/A 09/26/2019   Procedure: GREEN LIGHT LASER TURP (TRANSURETHRAL RESECTION OF PROSTATE;  Surgeon: Royston Cowper,  MD;  Location: [facility] ORS;  Service: Urology;  Laterality: N/A;  . HERNIA REPAIR     1980's  . PORTACATH PLACEMENT Left 06/29/2020   Procedure: INSERTION PORT-A-CATH;  Surgeon: [name], MD;  Location: [facility] ORS;  Service: Thoracic;  Laterality: Left;  Marland Kitchen VIDEO ASSISTED THORACOSCOPY (VATS)/THOROCOTOMY Left 06/29/2020   Procedure: VIDEO ASSISTED THORACOSCOPY;  Surgeon: [name], MD;  Location: [facility] ORS;  Service: Thoracic;  Laterality: Left;   Family History  Problem Relation Age of Onset  . Alzheimer's disease Mother   . Sarcoidosis Sister   . Anxiety disorder Brother   . Kidney disease Neg Hx   . Prostate cancer Neg Hx     Social History: reports that he quit smoking about 6 years ago. His smoking use included cigarettes. He has a 50.00 pack-year smoking history. He has never used smokeless tobacco. He reports current alcohol use of about 21.0 - 28.0 standard drinks of alcohol per week. He reports that he does not use drugs. He drinks 2-3 alcohol shots/day. His wife's name isJanice and granddaughter's name isAriel. The patient is accompanied by his wife and [facility], nurse navigator today.  Allergies:  Allergies  Allergen Reactions  . Indomethacin Swelling    ankle swelling  . Tamsulosin Hcl Hives  . Sulfa Antibiotics Rash   Current Outpatient Medications  Medication Instructions  . acetaminophen (TYLENOL) 650 mg, Oral, 3 times daily  . cetirizine (ZYRTEC) 10 mg, Oral, Daily  . cholecalciferol (VITAMIN D3) 1,000 Units, Oral, Daily  . cyanocobalamin 1,000 mcg, Oral, Daily  . diclofenac Sodium (VOLTAREN) 1 % GEL 1 application, Topical, 4 times daily PRN  . dutasteride (AVODART) 0.5 mg, Oral, Daily  . feeding supplement (ENSURE ENLIVE / ENSURE PLUS) LIQD 237 mLs, Oral, 2 times daily between meals  . folic acid (FOLVITE) 1 mg, Oral, Daily  . lactulose (CHRONULAC) 30 g, Oral, 2 times daily  . lidocaine (LIDODERM) 5 % 1 patch, Transdermal, Every 24 hours, Remove &  Discard patch within 12 hours or as directed by MD  . Lidocaine (SALONPAS PAIN RELIEVING) 4 % PTCH 1 patch, Apply externally,  Every morning - 10a  . megestrol (MEGACE) 200 mg, Oral, Daily  . midodrine (PROAMATINE) 10 mg, Oral, 4 times daily  . montelukast (SINGULAIR) 10 MG tablet TAKE 1 TABLET BY MOUTH EVERY DAY  . ondansetron (ZOFRAN) 8 mg, Oral, 2 times daily PRN, Start on day 3 after carboplatin chemo.  Marland Kitchen oxyCODONE (ROXICODONE) 15 mg, Oral, Every 4 hours PRN  . pantoprazole (PROTONIX) 20 mg, Oral, 2 times daily  . polyethylene glycol (MIRALAX / GLYCOLAX) 17 g, Oral, Daily PRN  . potassium chloride (K-DUR,KLOR-CON) 10 MEQ tablet 10 mEq, Oral, 2 times daily  . prochlorperazine (COMPAZINE) 10 mg, Oral, Every 6 hours PRN  . rosuvastatin (CRESTOR) 10 mg, Oral, Daily    Review of Systems  Constitutional: Positive for weight loss (5 lbs). Negative for chills, diaphoresis, fever and malaise/fatigue.       Feels "pretty good."  HENT: Negative for congestion, ear discharge, ear pain, hearing loss, nosebleeds, sinus pain, sore throat and tinnitus.   Eyes:  Negative for blurred vision.  Respiratory: Positive for shortness of breath (worsening). Negative for cough, hemoptysis and sputum production.        Left-sided chest wall pain. Uses oxygen at night  Cardiovascular: Positive for leg swelling. Negative for chest pain and palpitations.  Gastrointestinal: Positive for constipation (takes miralax BID) and nausea (s/p chemo). Negative for abdominal pain, blood in stool, diarrhea, heartburn, melena and vomiting.       Appetite starting to improve. Drinking Ensure BID.  Genitourinary: Negative for dysuria, frequency, hematuria and urgency.  Musculoskeletal: Negative for back pain, joint pain, myalgias and neck pain.  Skin: Negative for itching and rash.  Neurological: Negative for dizziness, tingling, sensory change, weakness and headaches.  Endo/Heme/Allergies: Does not bruise/bleed easily.   Psychiatric/Behavioral: Negative for depression and memory loss. The patient is not nervous/anxious and does not have insomnia.   All other systems reviewed and are negative.   Performance status: 2-3  Vital Signs There were no vitals taken for this visit.  Physical Exam Vitals and nursing note reviewed.  Constitutional:      General: He is not in acute distress.    Appearance: He is not diaphoretic.  HENT:     Head: Normocephalic and atraumatic.     Comments:  Short graying hair    Mouth/Throat:     Mouth: Mucous membranes are moist.     Pharynx: Oropharynx is clear.  Eyes:     General: No scleral icterus.    Extraocular Movements: Extraocular movements intact.     Conjunctiva/sclera: Conjunctivae normal.     Pupils: Pupils are equal, round, and reactive to light.     Comments: Glasses. Brown eyes.   Cardiovascular:     Rate and Rhythm: Normal rate and regular rhythm.     Heart sounds: Normal heart sounds. No murmur heard.   Pulmonary:     Effort: Pulmonary effort is normal. No respiratory distress.     Breath sounds: Normal breath sounds. No wheezing or rales.  Chest:     Chest wall: No tenderness.  Abdominal:     General: Bowel sounds are normal. There is no distension.     Palpations: Abdomen is soft. There is no mass.     Tenderness: There is no abdominal tenderness. There is no guarding or rebound.  Musculoskeletal:        General: Tenderness (left chest wall) present. No swelling. Normal range of motion.     Cervical back: Normal range of motion and neck supple.     Right lower leg: Edema (starts 4 cm above ankle) present.     Left lower leg: Edema (starts 4 cm above ankle) present.  Lymphadenopathy:     Head:     Right side of head: No preauricular, posterior auricular or occipital adenopathy.     Left side of head: No preauricular, posterior auricular or occipital adenopathy.     Cervical: No cervical adenopathy.     Upper Body:     Right upper body: No  supraclavicular or axillary adenopathy.     Left upper body: No supraclavicular or axillary adenopathy.     Lower Body: No right inguinal adenopathy. No left inguinal adenopathy.  Skin:    General: Skin is warm and dry.     Comments: Lidoderm patch on left chest. Pressure ulcer on bottom.   Neurological:     Mental Status: He is alert and oriented to person, place, and time.  Psychiatric:  Behavior: Behavior normal.        Thought Content: Thought content normal.        Judgment: Judgment normal.     No results found for this or any previous visit (from the past 48 hour(s)). DG Chest 2 View  Result Date: 07/13/2020 CLINICAL DATA:  Pleural effusion EXAM: CHEST - 2 VIEW COMPARISON:  June 29, 2020 FINDINGS: PleurX catheter again noted on the left with tip directed posteriorly. There is partially loculated left pleural effusion. There is airspace consolidation throughout mid and lower lung regions on the left. The right lung is clear. Heart is slightly enlarged with pulmonary vascularity normal. No adenopathy. No pneumothorax. There is bullous disease in the extreme right apex. No bone lesions. Port-A-Cath tip in superior vena cava. IMPRESSION: PleurX catheter present. Left pleural effusion appears similar on the left, partially loculated. Airspace opacity throughout the mid and lower lung regions. Slightly less opacity in the left upper lobe compared to most recent study. Right lung essentially clear. Stable cardiac silhouette. Electronically Signed   By: Lowella Grip III M.D.   On: 07/13/2020 10:10    Assessment:  Dwayne Thompson is a 74 y.o. male with extensive stage small cell lung cancer s/p left chest wall/pleural biopsy on 06/24/2020.  He presented with left hemothorax. Thoracentesis x2 (05/20/2020 and 06/05/2020) revealed bloody fluid with no evidence of malignancy.  He has a chest tube in place.  Chest CT on 06/05/2020 revealed pleural thickening, nodularity and pleural-based  mass with central adenopathy versus mass suspicious for pulmonary neoplasm with extensive pleural metastasis.  Mesothelioma is in the differential.  There was increasing left subcarinal lymph node use into the left mainstem bronchus.  There was increasing upper abdominal lymph nodes.  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  There was accentuated activity along the consolidative and atelectatic left lower lobe probably from tumor within the lung but potentially from tumor along the visceral pleura.  There was hypermetabolic thoracic adenopathy along with hypermetabolic upper abdominal adenopathy and hypermetabolic skeletal metastatic lesions (right iliac bone SUV 11.9 and T1). There was possible early tumor extension into the left T11-12 neuroforamen with some erosion of the adjacent T11 vertebral body.  Thoracic spine MRI with and without contrast on 06/28/2020 revealed a left paraspinous soft tissue mass extending from T9-10 through T12-L1, with invasion/involvement of the adjacent T10 and T11 vertebral bodies. Tumor partially extended into the left neural foramen at T11-12. There was no other significant extension into the spinal canal or epidural tumor. There were additional metastatic lesions involving the T1 and T10 vertebral bodies as well as the right pedicle of T6. There was no associated pathologic fracture. There was a heterogeneous and partially loculated left pleural effusion with consolidation signal intensity throughout the visualized left lower lung.  Brain MRI with and without contrast on 06/28/2020 revealed no evidence for intracranial metastatic disease. There was mild to moderate chronic microvascular ischemic disease.  He is day 10 s/p cycle #1 carboplatin, etoposide and Tecentriq with Neulasta support (07/06/2020).  He has a history of an upper GI bleed secondary to AVMs, B12 and  folate deficiency. He previously alcohol daily.  Symptomatically, he feels "pretty good."  He has very little pain. He has been taking Megace for 4 days; his appetite has improved a little bit.  Pleurx catheter is draining little.  Plan:   1.   Labs today:  CBC with  diff, BMP, Mg. 2.   Extensive stage small cell lung cancer             PET scan on 06/12/2020 revealed pleural based disease, hypermetabolic adenopathy, and skeletal metastasis.  Head MRI  revealed no evidence of metastatic disase.  He is day 10 s/p cycle #1 carboplatin, etoposide and Tecentriq with Neulasta support (07/06/2020).   He tolerated his chemotherapy well with manageable nausea.   Counts are good.  Pleural fluid drainage from Pleurx catheter has diminished.  Discuss symptom management.  He has antiemetics and pain medications at home to use on a prn bases.  Interventions are adequate.    3.   Recurrent pleural effusion  Pleurx catheter in place.  He has had decreased output in the last few days.  Follow-up as scheduled with Dr Genevive Bi. 4.   Normocytic anemia             Hematocrit 28.8.  Hemoglobin 9.8.  MCV 89.2 on 06/28/2020.   Ferritin 359 with an iron saturation of 14% (low) and TIBC 181 on 06/27/2020.  Hematocrit 28.1.  Hemoglobin 9.5  MCV 86.2 today.    Consider Retacrit for chemotherapy induced anemia.  Continue to monitor.         5.   Renal insufficiency, improved             Creatinine 0.95 today.             Baseline creatinine 0.85 - 1.12.  Continue to monitor. 6.  Cancer-related pain             Pain appears well controlled.  Continue oxycodone 10 mg po q 4 hours prn pain. 7.   Poor appetite  Appetite appears to be improved with low dose Megace.  Continue to monitor. [place] with PT and OT.  9.   Hyponatremia  Sodium 128 today.  Patient has lower extremity edema and an albumen of 2.7.  Discussed with Dr Holley Raring.   Consult pending.  Potential diuresis with  albumen. 10.   Consult Home health.  PT and OT. 11.   Please call Dr Elwyn Lade office about consult re: hyponatremia. 12.   RTC  on 07/27/2020 for MD assessment, labs (CBC with diff, CMP, Mg, CEA) and cycle #2 carboplatin, etoposide, and Tecentriq.  I discussed the assessment and treatment plan with the patient.  The patient was provided an opportunity to ask questions and all were answered.  The patient agreed with the plan and demonstrated an understanding of the instructions.  The patient was advised to call back or seek an in person evaluation if the symptoms worsen or if the condition fails to improve as anticipated.  I provided 18 minutes of face-to-face time during this this encounter and > 50% was spent counseling as documented under my assessment and plan.  An additional 5-6 minutes were spent reviewing his chart (Epic and Care Everywhere) including notes, labs, and imaging studies.    Nolon Stalls, MD, PhD  02/19/2020, 2:42 PM  I, De Burrs, am acting as a scribe for Lequita Asal, MD.  I, [place] Mike Gip, MD, have reviewed the above documentation for accuracy and completeness, and I agree with the above.

## 2020-07-15 ENCOUNTER — Other Ambulatory Visit: Payer: Self-pay

## 2020-07-15 ENCOUNTER — Encounter: Payer: Self-pay | Admitting: *Deleted

## 2020-07-15 ENCOUNTER — Ambulatory Visit: Payer: Medicare Other | Admitting: Hematology and Oncology

## 2020-07-15 ENCOUNTER — Encounter: Payer: Self-pay | Admitting: Hematology and Oncology

## 2020-07-15 ENCOUNTER — Other Ambulatory Visit: Payer: Medicare Other

## 2020-07-15 ENCOUNTER — Inpatient Hospital Stay (HOSPITAL_BASED_OUTPATIENT_CLINIC_OR_DEPARTMENT_OTHER): Payer: Medicare Other | Admitting: Hematology and Oncology

## 2020-07-15 ENCOUNTER — Inpatient Hospital Stay: Payer: Medicare Other

## 2020-07-15 VITALS — BP 90/53 | HR 99 | Temp 96.2°F | Resp 18 | Wt 176.4 lb

## 2020-07-15 DIAGNOSIS — C7951 Secondary malignant neoplasm of bone: Secondary | ICD-10-CM

## 2020-07-15 DIAGNOSIS — Z5112 Encounter for antineoplastic immunotherapy: Secondary | ICD-10-CM | POA: Diagnosis not present

## 2020-07-15 DIAGNOSIS — N289 Disorder of kidney and ureter, unspecified: Secondary | ICD-10-CM

## 2020-07-15 DIAGNOSIS — E871 Hypo-osmolality and hyponatremia: Secondary | ICD-10-CM

## 2020-07-15 DIAGNOSIS — D649 Anemia, unspecified: Secondary | ICD-10-CM | POA: Diagnosis not present

## 2020-07-15 DIAGNOSIS — J90 Pleural effusion, not elsewhere classified: Secondary | ICD-10-CM

## 2020-07-15 DIAGNOSIS — C3490 Malignant neoplasm of unspecified part of unspecified bronchus or lung: Secondary | ICD-10-CM

## 2020-07-15 DIAGNOSIS — G893 Neoplasm related pain (acute) (chronic): Secondary | ICD-10-CM

## 2020-07-15 LAB — CBC WITH DIFFERENTIAL/PLATELET
Abs Immature Granulocytes: 0.16 10*3/uL — ABNORMAL HIGH (ref 0.00–0.07)
Basophils Absolute: 0.1 10*3/uL (ref 0.0–0.1)
Basophils Relative: 1 %
Eosinophils Absolute: 0 10*3/uL (ref 0.0–0.5)
Eosinophils Relative: 0 %
HCT: 28.1 % — ABNORMAL LOW (ref 39.0–52.0)
Hemoglobin: 9.5 g/dL — ABNORMAL LOW (ref 13.0–17.0)
Immature Granulocytes: 2 %
Lymphocytes Relative: 9 %
Lymphs Abs: 0.9 10*3/uL (ref 0.7–4.0)
MCH: 29.1 pg (ref 26.0–34.0)
MCHC: 33.8 g/dL (ref 30.0–36.0)
MCV: 86.2 fL (ref 80.0–100.0)
Monocytes Absolute: 0.7 10*3/uL (ref 0.1–1.0)
Monocytes Relative: 6 %
Neutro Abs: 8.7 10*3/uL — ABNORMAL HIGH (ref 1.7–7.7)
Neutrophils Relative %: 82 %
Platelets: 85 10*3/uL — ABNORMAL LOW (ref 150–400)
RBC: 3.26 MIL/uL — ABNORMAL LOW (ref 4.22–5.81)
RDW: 13.2 % (ref 11.5–15.5)
WBC: 10.5 10*3/uL (ref 4.0–10.5)
nRBC: 0 % (ref 0.0–0.2)

## 2020-07-15 LAB — MAGNESIUM: Magnesium: 2.1 mg/dL (ref 1.7–2.4)

## 2020-07-15 LAB — BASIC METABOLIC PANEL
Anion gap: 10 (ref 5–15)
BUN: 14 mg/dL (ref 8–23)
CO2: 24 mmol/L (ref 22–32)
Calcium: 8.4 mg/dL — ABNORMAL LOW (ref 8.9–10.3)
Chloride: 94 mmol/L — ABNORMAL LOW (ref 98–111)
Creatinine, Ser: 0.95 mg/dL (ref 0.61–1.24)
GFR, Estimated: >60 mL/min (ref >60)
Glucose, Bld: 105 mg/dL — ABNORMAL HIGH (ref 70–99)
Potassium: 4.1 mmol/L (ref 3.5–5.1)
Sodium: 128 mmol/L — ABNORMAL LOW (ref 135–145)

## 2020-07-15 NOTE — Progress Notes (Signed)
Patient reports constipation and lack of appetite

## 2020-07-15 NOTE — Progress Notes (Signed)
  Oncology Nurse Navigator Documentation  Navigator Location: [facility] (07/15/20 1400)   )Navigator Encounter Type: Follow-up Appt (07/15/20 1400)                     Patient Visit Type: MedOnc (07/15/20 1400)   Barriers/Navigation Needs: Coordination of Care;Family Concerns (07/15/20 1400)   Interventions: Coordination of Care;Referrals (07/15/20 1400) Referrals: Home Health (07/15/20 1400) Coordination of Care: Appts (07/15/20 1400)         met with patient during follow up visit with Dr. Mike Gip. All questions answered during visit. Pt and wife questioned if he was referred to home health. Per nursing, pt has been referred to home health with [facility] but they will follow up to confirm. Pt has been made aware and instructed to call with any further questions or needs. Pt and his wife verbalized understanding.         Time Spent with Patient: 30 (07/15/20 1400)

## 2020-07-16 NOTE — Telephone Encounter (Signed)
Error

## 2020-07-19 ENCOUNTER — Other Ambulatory Visit: Payer: Self-pay | Admitting: Family Medicine

## 2020-07-19 DIAGNOSIS — L89301 Pressure ulcer of unspecified buttock, stage 1: Secondary | ICD-10-CM | POA: Insufficient documentation

## 2020-07-19 DIAGNOSIS — R630 Anorexia: Secondary | ICD-10-CM | POA: Insufficient documentation

## 2020-07-19 NOTE — Telephone Encounter (Signed)
Requested medication (s) are due for refill today: -  Requested medication (s) are on the active medication list: historical med  Last refill:  06/18/20  Future visit scheduled: yes  Notes to clinic:  historical med and provider   Requested Prescriptions  Pending Prescriptions Disp Refills   pantoprazole (PROTONIX) 20 MG tablet [Pharmacy Med Name: PANTOPRAZOLE SOD DR 20 MG TAB] 60 tablet     Sig: TAKE ONE TABLET BY MOUTH TWICE A DAY      Gastroenterology: Proton Pump Inhibitors Passed - 07/19/2020 10:15 AM      Passed - Valid encounter within last 12 months    Recent Outpatient Visits           1 week ago Cancer-related pain   [facility] Jerrol Banana., MD   1 month ago Chest pain, unspecified type   [facility] Jerrol Banana., MD   1 month ago Pneumonia due to infectious organism, unspecified laterality, unspecified part of lung   [facility] Jerrol Banana., MD   2 months ago Pleurisy with effusion   [facility] Jerrol Banana., MD   2 months ago Annual physical exam   [facility] Jerrol Banana., MD       Future Appointments             In 2 weeks Jerrol Banana., MD [facility], [place]   In 1 month Tyler Pita, MD [place]

## 2020-07-20 ENCOUNTER — Ambulatory Visit: Payer: Medicare Other | Admitting: Cardiothoracic Surgery

## 2020-07-20 ENCOUNTER — Encounter: Payer: Self-pay | Admitting: Cardiothoracic Surgery

## 2020-07-20 ENCOUNTER — Ambulatory Visit
Admission: RE | Admit: 2020-07-20 | Discharge: 2020-07-20 | Disposition: A | Discharge status: Home / Self Care | Payer: Medicare Other | Source: Home / Self Care | Attending: Cardiothoracic Surgery | Admitting: Cardiothoracic Surgery

## 2020-07-20 ENCOUNTER — Ambulatory Visit
Admission: RE | Admit: 2020-07-20 | Discharge: 2020-07-20 | Disposition: A | Discharge status: Home / Self Care | Payer: Medicare Other | Source: Ambulatory Visit | Attending: Cardiothoracic Surgery | Admitting: Cardiothoracic Surgery

## 2020-07-20 ENCOUNTER — Other Ambulatory Visit: Payer: Self-pay

## 2020-07-20 VITALS — BP 86/53 | HR 114 | Temp 98.2°F

## 2020-07-20 DIAGNOSIS — R0602 Shortness of breath: Secondary | ICD-10-CM | POA: Diagnosis not present

## 2020-07-20 DIAGNOSIS — J90 Pleural effusion, not elsewhere classified: Secondary | ICD-10-CM | POA: Insufficient documentation

## 2020-07-20 DIAGNOSIS — A419 Sepsis, unspecified organism: Secondary | ICD-10-CM | POA: Diagnosis not present

## 2020-07-20 LAB — ACID FAST CULTURE WITH REFLEXED SENSITIVITIES (MYCOBACTERIA): Acid Fast Culture: NEGATIVE

## 2020-07-20 NOTE — Progress Notes (Unsigned)
  Mr. Leckey returns today in follow-up.  His wife states that they have been draining his catheter at home and they have only been getting a few cc each time.  They have drained it several occasions.  Mr. Pianka continues to complain of shortness of breath and significant chest pain.  He did have a chest x-ray made today.  There did appear to be slightly more fluid at the left base.  Independent review of his chest x-ray confirmed that there appears to be more fluid at the left base.  We drained his catheter today and we received about 125 cc of serosanguineous drainage.  All of his thoracoscopy sites look fine.  There is no erythema or drainage from them.  I instructed he and his wife to continue to drain the Pleurx catheter every other day.  She will keep a record of it and I will schedule a virtual visit with her during my next clinic visit.  At that time we can then ascertain whether or not it is suitable to remove his Pleurx catheter.  All questions were answered.

## 2020-07-20 NOTE — Patient Instructions (Addendum)
Start draining your catheter every other day. You will drain it next on Wednesday. Keep track of how much drains.  We will have a Virtual phone visit next week with Dr Genevive Bi. Tuesday November 9th at 3pm.

## 2020-07-21 ENCOUNTER — Other Ambulatory Visit: Payer: Self-pay

## 2020-07-21 ENCOUNTER — Emergency Department: Payer: Medicare Other

## 2020-07-21 ENCOUNTER — Observation Stay: Payer: Medicare Other

## 2020-07-21 ENCOUNTER — Inpatient Hospital Stay
Admission: EM | Admit: 2020-07-21 | Discharge: 2020-07-25 | DRG: 853 | Disposition: A | Discharge status: Home Health | Payer: Medicare Other | Attending: Internal Medicine | Admitting: Internal Medicine

## 2020-07-21 ENCOUNTER — Ambulatory Visit: Payer: Self-pay

## 2020-07-21 ENCOUNTER — Telehealth: Payer: Self-pay | Admitting: *Deleted

## 2020-07-21 ENCOUNTER — Telehealth: Payer: Self-pay | Admitting: Hematology and Oncology

## 2020-07-21 DIAGNOSIS — C3490 Malignant neoplasm of unspecified part of unspecified bronchus or lung: Secondary | ICD-10-CM | POA: Diagnosis present

## 2020-07-21 DIAGNOSIS — K7460 Unspecified cirrhosis of liver: Secondary | ICD-10-CM | POA: Diagnosis present

## 2020-07-21 DIAGNOSIS — Z87891 Personal history of nicotine dependence: Secondary | ICD-10-CM

## 2020-07-21 DIAGNOSIS — J449 Chronic obstructive pulmonary disease, unspecified: Secondary | ICD-10-CM | POA: Diagnosis present

## 2020-07-21 DIAGNOSIS — K219 Gastro-esophageal reflux disease without esophagitis: Secondary | ICD-10-CM | POA: Diagnosis present

## 2020-07-21 DIAGNOSIS — J9620 Acute and chronic respiratory failure, unspecified whether with hypoxia or hypercapnia: Secondary | ICD-10-CM | POA: Insufficient documentation

## 2020-07-21 DIAGNOSIS — Z818 Family history of other mental and behavioral disorders: Secondary | ICD-10-CM

## 2020-07-21 DIAGNOSIS — E43 Unspecified severe protein-calorie malnutrition: Secondary | ICD-10-CM | POA: Insufficient documentation

## 2020-07-21 DIAGNOSIS — J9621 Acute and chronic respiratory failure with hypoxia: Secondary | ICD-10-CM | POA: Diagnosis present

## 2020-07-21 DIAGNOSIS — Z79899 Other long term (current) drug therapy: Secondary | ICD-10-CM

## 2020-07-21 DIAGNOSIS — J910 Malignant pleural effusion: Secondary | ICD-10-CM | POA: Diagnosis present

## 2020-07-21 DIAGNOSIS — Z882 Allergy status to sulfonamides status: Secondary | ICD-10-CM

## 2020-07-21 DIAGNOSIS — G893 Neoplasm related pain (acute) (chronic): Secondary | ICD-10-CM | POA: Diagnosis present

## 2020-07-21 DIAGNOSIS — Z515 Encounter for palliative care: Secondary | ICD-10-CM

## 2020-07-21 DIAGNOSIS — R0902 Hypoxemia: Secondary | ICD-10-CM

## 2020-07-21 DIAGNOSIS — R0602 Shortness of breath: Secondary | ICD-10-CM

## 2020-07-21 DIAGNOSIS — I82433 Acute embolism and thrombosis of popliteal vein, bilateral: Secondary | ICD-10-CM | POA: Diagnosis present

## 2020-07-21 DIAGNOSIS — Z820 Family history of epilepsy and other diseases of the nervous system: Secondary | ICD-10-CM

## 2020-07-21 DIAGNOSIS — R0609 Other forms of dyspnea: Secondary | ICD-10-CM

## 2020-07-21 DIAGNOSIS — Z6824 Body mass index (BMI) 24.0-24.9, adult: Secondary | ICD-10-CM

## 2020-07-21 DIAGNOSIS — R0600 Dyspnea, unspecified: Secondary | ICD-10-CM

## 2020-07-21 DIAGNOSIS — E785 Hyperlipidemia, unspecified: Secondary | ICD-10-CM | POA: Diagnosis present

## 2020-07-21 DIAGNOSIS — Z66 Do not resuscitate: Secondary | ICD-10-CM | POA: Diagnosis not present

## 2020-07-21 DIAGNOSIS — N138 Other obstructive and reflux uropathy: Secondary | ICD-10-CM | POA: Diagnosis present

## 2020-07-21 DIAGNOSIS — A419 Sepsis, unspecified organism: Principal | ICD-10-CM | POA: Diagnosis present

## 2020-07-21 DIAGNOSIS — I9589 Other hypotension: Secondary | ICD-10-CM | POA: Diagnosis present

## 2020-07-21 DIAGNOSIS — R609 Edema, unspecified: Secondary | ICD-10-CM

## 2020-07-21 DIAGNOSIS — J90 Pleural effusion, not elsewhere classified: Secondary | ICD-10-CM

## 2020-07-21 DIAGNOSIS — L89312 Pressure ulcer of right buttock, stage 2: Secondary | ICD-10-CM | POA: Diagnosis present

## 2020-07-21 DIAGNOSIS — I2699 Other pulmonary embolism without acute cor pulmonale: Secondary | ICD-10-CM | POA: Diagnosis present

## 2020-07-21 DIAGNOSIS — N401 Enlarged prostate with lower urinary tract symptoms: Secondary | ICD-10-CM | POA: Diagnosis present

## 2020-07-21 DIAGNOSIS — C7951 Secondary malignant neoplasm of bone: Secondary | ICD-10-CM | POA: Diagnosis present

## 2020-07-21 DIAGNOSIS — J188 Other pneumonia, unspecified organism: Secondary | ICD-10-CM | POA: Diagnosis present

## 2020-07-21 DIAGNOSIS — N400 Enlarged prostate without lower urinary tract symptoms: Secondary | ICD-10-CM | POA: Insufficient documentation

## 2020-07-21 DIAGNOSIS — I119 Hypertensive heart disease without heart failure: Secondary | ICD-10-CM | POA: Diagnosis present

## 2020-07-21 DIAGNOSIS — C3492 Malignant neoplasm of unspecified part of left bronchus or lung: Secondary | ICD-10-CM

## 2020-07-21 DIAGNOSIS — Z20822 Contact with and (suspected) exposure to covid-19: Secondary | ICD-10-CM | POA: Diagnosis present

## 2020-07-21 DIAGNOSIS — I824Z3 Acute embolism and thrombosis of unspecified deep veins of distal lower extremity, bilateral: Secondary | ICD-10-CM | POA: Diagnosis present

## 2020-07-21 DIAGNOSIS — R6520 Severe sepsis without septic shock: Secondary | ICD-10-CM | POA: Diagnosis present

## 2020-07-21 DIAGNOSIS — Z888 Allergy status to other drugs, medicaments and biological substances status: Secondary | ICD-10-CM

## 2020-07-21 LAB — RESPIRATORY PANEL BY RT PCR (FLU A&B, COVID)
Influenza A by PCR: NEGATIVE
Influenza B by PCR: NEGATIVE
SARS Coronavirus 2 by RT PCR: NEGATIVE

## 2020-07-21 LAB — TROPONIN I (HIGH SENSITIVITY)
Troponin I (High Sensitivity): 12 ng/L (ref <18)
Troponin I (High Sensitivity): 12 ng/L (ref <18)

## 2020-07-21 LAB — BASIC METABOLIC PANEL
Anion gap: 12 (ref 5–15)
BUN: 17 mg/dL (ref 8–23)
CO2: 22 mmol/L (ref 22–32)
Calcium: 8.9 mg/dL (ref 8.9–10.3)
Chloride: 101 mmol/L (ref 98–111)
Creatinine, Ser: 0.9 mg/dL (ref 0.61–1.24)
GFR, Estimated: >60 mL/min (ref >60)
Glucose, Bld: 125 mg/dL — ABNORMAL HIGH (ref 70–99)
Potassium: 3.8 mmol/L (ref 3.5–5.1)
Sodium: 135 mmol/L (ref 135–145)

## 2020-07-21 LAB — CBC
HCT: 30.6 % — ABNORMAL LOW (ref 39.0–52.0)
Hemoglobin: 10.2 g/dL — ABNORMAL LOW (ref 13.0–17.0)
MCH: 29.4 pg (ref 26.0–34.0)
MCHC: 33.3 g/dL (ref 30.0–36.0)
MCV: 88.2 fL (ref 80.0–100.0)
Platelets: 133 10*3/uL — ABNORMAL LOW (ref 150–400)
RBC: 3.47 MIL/uL — ABNORMAL LOW (ref 4.22–5.81)
RDW: 14.4 % (ref 11.5–15.5)
WBC: 18 10*3/uL — ABNORMAL HIGH (ref 4.0–10.5)
nRBC: 0 % (ref 0.0–0.2)

## 2020-07-21 LAB — BRAIN NATRIURETIC PEPTIDE: B Natriuretic Peptide: 325.1 pg/mL — ABNORMAL HIGH (ref 0.0–100.0)

## 2020-07-21 LAB — HIV ANTIBODY (ROUTINE TESTING W REFLEX): HIV Screen 4th Generation wRfx: NONREACTIVE

## 2020-07-21 LAB — LACTIC ACID, PLASMA: Lactic Acid, Venous: 2.4 mmol/L (ref 0.5–1.9)

## 2020-07-21 MED ORDER — SODIUM CHLORIDE 0.9 % IV SOLN
1.0000 g | INTRAVENOUS | Status: DC
  Administered 2020-07-21 – 2020-07-22 (×2): 1 g via INTRAVENOUS
  Filled 2020-07-21: qty 10
  Filled 2020-07-21: qty 1
  Filled 2020-07-21: qty 10

## 2020-07-21 MED ORDER — ENOXAPARIN SODIUM 80 MG/0.8ML ~~LOC~~ SOLN
1.0000 mg/kg | Freq: Two times a day (BID) | SUBCUTANEOUS | Status: DC
  Filled 2020-07-21 (×2): qty 0.8

## 2020-07-21 MED ORDER — HEPARIN SODIUM (PORCINE) 5000 UNIT/ML IJ SOLN: 5000.0000 [IU] | Freq: Three times a day (TID) | INTRAMUSCULAR | Status: DC

## 2020-07-21 MED ORDER — SODIUM CHLORIDE 0.9% FLUSH
3.0000 mL | Freq: Two times a day (BID) | INTRAVENOUS | Status: DC
  Administered 2020-07-21 – 2020-07-25 (×7): 3 mL via INTRAVENOUS

## 2020-07-21 MED ORDER — SODIUM CHLORIDE 0.9 % IV SOLN
250.0000 mL | INTRAVENOUS | Status: DC | PRN
  Administered 2020-07-24: 250 mL via INTRAVENOUS

## 2020-07-21 MED ORDER — IPRATROPIUM-ALBUTEROL 0.5-2.5 (3) MG/3ML IN SOLN
3.0000 mL | Freq: Four times a day (QID) | RESPIRATORY_TRACT | Status: DC
  Administered 2020-07-21 – 2020-07-22 (×2): 3 mL via RESPIRATORY_TRACT
  Filled 2020-07-21: qty 3

## 2020-07-21 MED ORDER — SODIUM CHLORIDE 0.9 % IV SOLN
500.0000 mg | INTRAVENOUS | Status: DC
  Administered 2020-07-21 – 2020-07-22 (×2): 500 mg via INTRAVENOUS
  Filled 2020-07-21 (×3): qty 500

## 2020-07-21 MED ORDER — SODIUM CHLORIDE 0.9% FLUSH: 3.0000 mL | INTRAVENOUS | Status: DC | PRN

## 2020-07-21 MED ORDER — IPRATROPIUM-ALBUTEROL 0.5-2.5 (3) MG/3ML IN SOLN: 3.0000 mL | Freq: Four times a day (QID) | RESPIRATORY_TRACT | Status: DC | PRN

## 2020-07-21 MED ORDER — MORPHINE SULFATE (PF) 4 MG/ML IV SOLN
4.0000 mg | Freq: Once | INTRAVENOUS | Status: AC
  Administered 2020-07-21: 4 mg via INTRAVENOUS
  Filled 2020-07-21: qty 1

## 2020-07-21 MED ORDER — LIDOCAINE-EPINEPHRINE-TETRACAINE (LET) TOPICAL GEL
3.0000 mL | Freq: Once | TOPICAL | Status: AC
  Administered 2020-07-21: 3 mL via TOPICAL
  Filled 2020-07-21: qty 3

## 2020-07-21 NOTE — Assessment & Plan Note (Addendum)
Pt coming to [place] with worsening sob with h/o of Underlying small cell lung cancer . Pt sees Dr.Corcoran and they have been consulted.  At home he uses 2 L and only prn and now has been progressively getting worse. Pt's oxygen requirement has increased to 4 L and d/d include VTE , worsening Cancer. We will provide supportive care with supplemental oxygen and consult oncology  And palliative care.  We will obtain le venous dopplers and tailor therapy.

## 2020-07-21 NOTE — ED Triage Notes (Addendum)
Pt comes via EMS from home with c/o increased SOB. Pt states he was recently dx with lung cancer and last treatment was MOnday which was also his first.  EMS reports pt O2 was 90-92 on 2LNC. Pt placed on 4L by EMS and O2 92%.  Pt states worsening SOB with exertion. Pt has labored breathing and difficulty speaking without showing signs of SOB. Pt denies any CP, NV/D

## 2020-07-21 NOTE — ED Notes (Signed)
Unsuccessful attempt to access port

## 2020-07-21 NOTE — Telephone Encounter (Signed)
Re:  Shortness of breath  Patient's wife called regarding shortness of breath.  Oxygen saturation 82-86%.  Patient directed to the emergency room.  The patient's wife will call an ambulance.  The ER triage nurse was contacted.   Lequita Asal, MD

## 2020-07-21 NOTE — Telephone Encounter (Signed)
[name] called reporting that patient has been having shortness of breath this morning and that when he awoke, is O2 sat was 82%, He is now sitting up and O2 sat is 89-90%, but he is still having shortness of breath. Shew states she spoke with someone at hospital and was told to bring him to ER. She is asking if he needs to go to hospital or what to do. Please advise

## 2020-07-21 NOTE — H&P (Addendum)
History and Physical    Dwayne Thompson WNU:[id number] DOB: November 29, 1945 DOA: 07/21/2020  PCP: Jerrol Banana., MD    Patient coming from:  home   Chief Complaint:  Sob   HPI: Dwayne Thompson is a 74 y.o. male with medical history significant of COPD,HTN, cirrhosis, lung cancer diagnosed October 4th-14 th.Pt just had one round of chemo therapy Dr. Mike Gip.Per reports sob worsening for past few days and is only on prn oxygen and least but of exertion he gets sob.Pt is alert and give his own history is distraught because of his cancer. Wife at bedside.   ED Course:  Vitals:   07/21/20 1630 07/21/20 1800 07/21/20 1830 07/21/20 1900  BP: 109/72 116/61 96/69 (!) 93/58  Pulse: 95 94 (!) 101 95  Resp: (!) 22 19 (!) 21 20  SpO2: 97% 93% (!) 89% 96%  Weight:      Height:      labs are stable no changes.   Review of Systems:  Review of Systems  Respiratory: Positive for shortness of breath.   All other systems reviewed and are negative.  Past Medical History:  Diagnosis Date  . Anemia   . Back pain   . BPH (benign prostatic hyperplasia)   . Cirrhosis ([place])   . COPD (chronic obstructive pulmonary disease) ([place])   . ED (erectile dysfunction)   . GERD (gastroesophageal reflux disease)   . Hyperlipidemia   . Hypertension     Past Surgical History:  Procedure Laterality Date  . COLONOSCOPY Left 01/03/2018   Procedure: COLONOSCOPY;  Surgeon: Virgel Manifold, MD;  Location: [facility] ENDOSCOPY;  Service: Endoscopy;  Laterality: Left;  . COLONOSCOPY WITH PROPOFOL N/A 04/05/2018   Procedure: COLONOSCOPY WITH PROPOFOL;  Surgeon: Virgel Manifold, MD;  Location: [facility] ENDOSCOPY;  Service: Endoscopy;  Laterality: N/A;  . CYSTOSCOPY WITH STENT PLACEMENT Right 09/26/2019   Procedure: CYSTOSCOPY WITH STENT PLACEMENT;  Surgeon: Royston Cowper, MD;  Location: [facility] ORS;  Service: Urology;  Laterality: Right;  . ENTEROSCOPY  01/03/2018   Procedure: ENTEROSCOPY;  Surgeon: Virgel Manifold, MD;  Location: [facility] ENDOSCOPY;  Service: Endoscopy;;  . ESOPHAGOGASTRODUODENOSCOPY N/A 01/02/2018   Procedure: ESOPHAGOGASTRODUODENOSCOPY (EGD);  Surgeon: Virgel Manifold, MD;  Location: [facility] ENDOSCOPY;  Service: Endoscopy;  Laterality: N/A;  . ESOPHAGOGASTRODUODENOSCOPY (EGD) WITH PROPOFOL N/A 04/05/2018   Procedure: ESOPHAGOGASTRODUODENOSCOPY (EGD) WITH PROPOFOL;  Surgeon: Virgel Manifold, MD;  Location: [facility] ENDOSCOPY;  Service: Endoscopy;  Laterality: N/A;  . exploration and neurolysis of right peroneal nerve at the knee Right 08/27/2018  . GREEN LIGHT LASER TURP (TRANSURETHRAL RESECTION OF PROSTATE N/A 09/26/2019   Procedure: GREEN LIGHT LASER TURP (TRANSURETHRAL RESECTION OF PROSTATE;  Surgeon: Royston Cowper, MD;  Location: [facility] ORS;  Service: Urology;  Laterality: N/A;  . HERNIA REPAIR     1980's  . PORTACATH PLACEMENT Left 06/29/2020   Procedure: INSERTION PORT-A-CATH;  Surgeon: [name], MD;  Location: [facility] ORS;  Service: Thoracic;  Laterality: Left;  Marland Kitchen VIDEO ASSISTED THORACOSCOPY (VATS)/THOROCOTOMY Left 06/29/2020   Procedure: VIDEO ASSISTED THORACOSCOPY;  Surgeon: [name], MD;  Location: [facility] ORS;  Service: Thoracic;  Laterality: Left;     reports that he quit smoking about 6 years ago. His smoking use included cigarettes. He has a 50.00 pack-year smoking history. He has never used smokeless tobacco. He reports previous alcohol use. He reports that he does not use drugs.  Allergies  Allergen Reactions  . Indomethacin Swelling    ankle  swelling  . Tamsulosin Hcl Hives  . Sulfa Antibiotics Rash    Family History  Problem Relation Age of Onset  . Alzheimer's disease Mother   . Sarcoidosis Sister   . Anxiety disorder Brother   . Kidney disease Neg Hx   . Prostate cancer Neg Hx     Prior to Admission medications   Medication Sig Start Date End Date Taking? Authorizing Provider  acetaminophen (TYLENOL) 325 MG tablet Take 2 tablets (650 mg total) by  mouth 3 (three) times daily. Patient taking differently: Take 650 mg by mouth every 6 (six) hours as needed for moderate pain.  01/04/18   Salary, Avel Peace, MD  cetirizine (ZYRTEC) 10 MG tablet Take 1 tablet (10 mg total) by mouth daily. 01/30/20   Jerrol Banana., MD  cholecalciferol (VITAMIN D3) 25 MCG (1000 UNIT) tablet Take 1,000 Units by mouth daily.    [provider]  diclofenac Sodium (VOLTAREN) 1 % GEL Apply 1 application topically 4 (four) times daily as needed (pain).    [provider]  dutasteride (AVODART) 0.5 MG capsule Take 0.5 mg by mouth daily.    [provider]  feeding supplement (ENSURE ENLIVE / ENSURE PLUS) LIQD Take 237 mLs by mouth 2 (two) times daily between meals. 07/02/20   Loletha Grayer, MD  folic acid (FOLVITE) 1 MG tablet Take 1 tablet (1 mg total) by mouth daily. 12/15/18   Lequita Asal, MD  lactulose (CHRONULAC) 10 GM/15ML solution Take 45 mLs (30 g total) by mouth 2 (two) times daily. Patient not taking: Reported on 07/13/2020 07/02/20   Loletha Grayer, MD  lidocaine (LIDODERM) 5 % Place 1 patch onto the skin daily. Remove & Discard patch within 12 hours or as directed by MD 07/02/20   Loletha Grayer, MD  Lidocaine (SALONPAS PAIN RELIEVING) 4 % PTCH Apply 1 patch topically every morning. 07/02/20   Loletha Grayer, MD  megestrol (MEGACE) 40 MG/ML suspension Take 5 mLs (200 mg total) by mouth daily. 07/08/20   Lequita Asal, MD  midodrine (PROAMATINE) 10 MG tablet Take 1 tablet (10 mg total) by mouth 4 (four) times daily. 07/02/20   Wieting, Richard, MD  montelukast (SINGULAIR) 10 MG tablet TAKE 1 TABLET BY MOUTH EVERY DAY Patient taking differently: Take 10 mg by mouth daily.  05/12/20   Jerrol Banana., MD  ondansetron (ZOFRAN) 8 MG tablet Take 1 tablet (8 mg total) by mouth 2 (two) times daily as needed for refractory nausea / vomiting. Start on day 3 after carboplatin chemo. 07/07/20   Lequita Asal,  MD  oxyCODONE (ROXICODONE) 15 MG immediate release tablet Take 1 tablet (15 mg total) by mouth every 4 (four) hours as needed for pain. 07/09/20   Jerrol Banana., MD  pantoprazole (PROTONIX) 20 MG tablet TAKE ONE TABLET BY MOUTH TWICE A DAY 07/20/20   Jerrol Banana., MD  polyethylene glycol (MIRALAX / GLYCOLAX) 17 g packet Take 17 g by mouth daily as needed for moderate constipation. 07/02/20   Loletha Grayer, MD  potassium chloride (K-DUR,KLOR-CON) 10 MEQ tablet Take 1 tablet (10 mEq total) by mouth 2 (two) times daily. 12/21/18   Jerrol Banana., MD  prochlorperazine (COMPAZINE) 10 MG tablet Take 1 tablet (10 mg total) by mouth every 6 (six) hours as needed (Nausea or vomiting). 07/07/20   Lequita Asal, MD  rosuvastatin (CRESTOR) 10 MG tablet Take 1 tablet (10 mg total) by mouth daily. 10/28/19  Jerrol Banana., MD  vitamin B-12 1000 MCG tablet Take 1 tablet (1,000 mcg total) by mouth daily. 01/05/18   Gorden Harms, MD    Physical Exam: Vitals:   07/21/20 1630 07/21/20 1800 07/21/20 1830 07/21/20 1900  BP: 109/72 116/61 96/69 (!) 93/58  Pulse: 95 94 (!) 101 95  Resp: (!) 22 19 (!) 21 20  SpO2: 97% 93% (!) 89% 96%  Weight:      Height:        Physical Exam Constitutional:      Interventions: He is not intubated. HENT:     Head: Normocephalic and atraumatic.  Eyes:     Extraocular Movements: Extraocular movements intact.     Pupils: Pupils are equal, round, and reactive to light.  Cardiovascular:     Rate and Rhythm: Normal rate and regular rhythm.  Pulmonary:     Effort: Pulmonary effort is normal. No tachypnea, bradypnea, accessory muscle usage or respiratory distress. He is not intubated.     Breath sounds: No stridor. Examination of the right-middle field reveals wheezing. Examination of the left-middle field reveals wheezing. Examination of the right-lower field reveals wheezing. Examination of the left-lower field reveals wheezing.  Wheezing present.  Neurological:     General: No focal deficit present.     Mental Status: He is alert and oriented to person, place, and time.    Labs on Admission: I have personally reviewed following labs and imaging studies  CBC: Recent Labs  Lab 07/15/20 1319 07/21/20 1333  WBC 10.5 18.0*  NEUTROABS 8.7*  --   HGB 9.5* 10.2*  HCT 28.1* 30.6*  MCV 86.2 88.2  PLT 85* 202*   Basic Metabolic Panel: Recent Labs  Lab 07/15/20 1319 07/21/20 1333  NA 128* 135  K 4.1 3.8  CL 94* 101  CO2 24 22  GLUCOSE 105* 125*  BUN 14 17  CREATININE 0.95 0.90  CALCIUM 8.4* 8.9  MG 2.1  --    GFR: Estimated Creatinine Clearance: 76.7 mL/min (by C-G formula based on SCr of 0.9 mg/dL). Liver Function Tests: No results for input(s): AST, ALT, ALKPHOS, BILITOT, PROT, ALBUMIN in the last 168 hours. No results for input(s): LIPASE, AMYLASE in the last 168 hours. No results for input(s): AMMONIA in the last 168 hours. Coagulation Profile: No results for input(s): INR, PROTIME in the last 168 hours. Cardiac Enzymes: No results for input(s): CKTOTAL, CKMB, CKMBINDEX, TROPONINI in the last 168 hours. BNP (last 3 results) No results for input(s): PROBNP in the last 8760 hours. HbA1C: No results for input(s): HGBA1C in the last 72 hours. CBG: No results for input(s): GLUCAP in the last 168 hours. Lipid Profile: No results for input(s): CHOL, HDL, LDLCALC, TRIG, CHOLHDL, LDLDIRECT in the last 72 hours. Thyroid Function Tests: No results for input(s): TSH, T4TOTAL, FREET4, T3FREE, THYROIDAB in the last 72 hours. Anemia Panel: No results for input(s): VITAMINB12, FOLATE, FERRITIN, TIBC, IRON, RETICCTPCT in the last 72 hours. Urine analysis:    Component Value Date/Time   APPEARANCEUR Clear 07/31/2019 1109   GLUCOSEU Negative 07/31/2019 1109   BILIRUBINUR negative 04/23/2020 1511   BILIRUBINUR Negative 07/31/2019 1109   PROTEINUR Negative 04/23/2020 1511   PROTEINUR Negative 07/31/2019  1109   UROBILINOGEN 2.0 (A) 04/23/2020 1511   NITRITE negative 04/23/2020 1511   NITRITE Negative 07/31/2019 1109   LEUKOCYTESUR Negative 04/23/2020 1511   LEUKOCYTESUR Negative 07/31/2019 1109   No intake or output data in the 24 hours ending 07/21/20 1940  Lab Results  Component Value Date   CREATININE 0.90 07/21/2020   CREATININE 0.95 07/15/2020   CREATININE 0.72 07/07/2020    COVID-19 Labs  No results for input(s): DDIMER, FERRITIN, LDH, CRP in the last 72 hours.  Lab Results  Component Value Date   SARSCOV2NAA NEGATIVE 07/21/2020   [place] NEGATIVE 06/20/2020   [place] NEGATIVE 06/04/2020   [place] NEGATIVE 05/20/2020    Radiological Exams on Admission: DG Chest 2 View Result Date: 07/21/2020 CLINICAL DATA:  Pleural effusion, PleurX catheter in place. EXAM: CHEST - 2 VIEW COMPARISON:  July 13, 2020. FINDINGS: Similar appearance of a small partially loculated left pleural effusion with overlying airspace opacities. Similar more subtle opacities in the left upper lobe. Similar position of a left PleurX catheter. The right lung is clear. No visible pneumothorax. Similar cardiomediastinal silhouette, partially obscured. Left subclavian approach Port-A-Cath in similar position. No acute osseous abnormality. IMPRESSION: 1. Similar appearance of a small partially loculated left pleural effusion with overlying airspace opacities. Similar position of a left PleurX catheter. 2. Similar more subtle opacities in the left upper lobe. Electronically Signed   By: Margaretha Sheffield MD   On: 07/21/2020 14:26   DG Chest 2 View Result Date: 07/21/2020 CLINICAL DATA:  Shortness of breath. EXAM: CHEST - 2 VIEW COMPARISON:  July 20, 2020. FINDINGS: Similar appearance of a small partially loculated left pleural effusion with overlying airspace opacities. Similar more subtle opacities in the left upper lobe. Similar position of a left PleurX catheter. The right lung is clear. No visible  pneumothorax. Similar cardiomediastinal silhouette, partially obscured. Left subclavian approach Port-A-Cath in similar position. No acute osseous abnormality. IMPRESSION: 1. Similar appearance of a small partially loculated left pleural effusion with overlying airspace opacities. Similar position of a left PleurX catheter. 2. Similar more subtle opacities in the left upper lobe. Electronically Signed   By: Margaretha Sheffield MD   On: 07/21/2020 14:25   US Venous Img Lower Bilateral (DVT) Result Date: 07/21/2020 CLINICAL DATA:  Lower extremity edema EXAM: BILATERAL LOWER EXTREMITY VENOUS DOPPLER ULTRASOUND TECHNIQUE: Gray-scale sonography with graded compression, as well as color Doppler and duplex ultrasound were performed to evaluate the lower extremity deep venous systems from the level of the common femoral vein and including the common femoral, femoral, profunda femoral, popliteal and calf veins including the posterior tibial, peroneal and gastrocnemius veins when visible. The superficial great saphenous vein was also interrogated. Spectral Doppler was utilized to evaluate flow at rest and with distal augmentation maneuvers in the common femoral, femoral and popliteal veins. COMPARISON:  None. FINDINGS: RIGHT LOWER EXTREMITY Common Femoral Vein: No evidence of thrombus. Normal compressibility, respiratory phasicity and response to augmentation. Saphenofemoral Junction: No evidence of thrombus. Normal compressibility and flow on color Doppler imaging. Profunda Femoral Vein: No evidence of thrombus. Normal compressibility and flow on color Doppler imaging. Femoral Vein: No evidence of thrombus. Normal compressibility, respiratory phasicity and response to augmentation. Popliteal Vein: Thrombus is noted with decreased compressibility. Calf Veins: Thrombus is noted within the posterior tibial and peroneal veins with decreased compressibility. Superficial Great Saphenous Vein: No evidence of thrombus. Normal  compressibility. Venous Reflux:  None. Other Findings:  None. LEFT LOWER EXTREMITY Common Femoral Vein: No evidence of thrombus. Normal compressibility, respiratory phasicity and response to augmentation. Saphenofemoral Junction: No evidence of thrombus. Normal compressibility and flow on color Doppler imaging. Profunda Femoral Vein: No evidence of thrombus. Normal compressibility and flow on color Doppler imaging. Femoral Vein: No evidence of thrombus. Normal compressibility,  respiratory phasicity and response to augmentation. Popliteal Vein: Thrombus is noted with decreased compressibility. Calf Veins: Peroneal vein demonstrates thrombus with decreased compressibility. Superficial Great Saphenous Vein: No evidence of thrombus. Normal compressibility. Venous Reflux:  None. Other Findings:  None.  IMPRESSION: Bilateral deep venous thrombosis involving the popliteal and calf veins. Electronically Signed   By: Inez Catalina M.D.   On: 07/21/2020 18:06    EKG: Independently reviewed.  Sinus tach 106.  Assessment/Plan  Acute on chronic respiratory failure with hypoxia (HCC)/ SCLC/COPD Assessment & Plan Pt coming to [place] with worsening sob with h/o of Underlying small cell lung cancer . Pt sees Dr.Corcoran and they have been consulted.  At home he uses 2 L and only prn and now has been progressively getting worse. Pt's oxygen requirement has increased to 4 L and d/d include VTE , worsening Cancer. We will provide supportive care with supplemental oxygen and consult oncology  And palliative care.  We will obtain le venous dopplers and tailor therapy. Pt has left lung drain placed by CT sx on 06/22/2020. >>7:36PM addendum:Pt on initial presentation met sirs criteria due to tachycardia and rr 22 and wbc count, lactic returned as elevated and have started empiric abx for possible underlying postobstructive pneumonia/ CAP and now pt meets sever sepsis criteria due to lactic acid elevation. Also LE usg done for  swelling is pos for DVT.   BPH with obstruction/lower urinary tract symptoms Assessment & Plan Continue avodart.   LE Edema / BL LE CALF DVT Assessment & Plan Pt found to have BL DVT as highlighted abot and we will start pt on Lovenox and pharmacy consult.  Bilateral deep venous thrombosis involving the popliteal and calf veins.     DVT prophylaxis:  Heparin   Code Status:  Full Code.  Family Communication:  Wife patricia perales and [phone].  Disposition Plan:  TBD   Consults called:  None.  Admission status: Inpatient.   Para Skeans MD [facility] Pager [phone] If 7PM-7AM, please contact night-coverage [email or web address] Password [facility] 07/21/2020, 7:40 PM

## 2020-07-21 NOTE — Assessment & Plan Note (Signed)
Continue avodart.

## 2020-07-21 NOTE — ED Notes (Addendum)
Care assumed of pt at this time. Pt frustrated with stay in ED. Attempted to settle pt in. Pt frustrated with wait, with lack of communication, etc. Pt provided with warm blankets, heat turned up in room, and tucked in. Oriented to call bell at this time. No c/o currently. Pt with patent #[id number] to Rt hand. States that they attempted to access his port, though unsuccessful and requests no further sticks at this time. Will comply with pt request for comfort. Has on O2 @ 4L/. Sat 94%.

## 2020-07-21 NOTE — ED Provider Notes (Signed)
[facility] Emergency Department Provider Note ____________________________________________   First MD Initiated Contact with Patient 07/21/20 1403     (approximate)  I have reviewed the triage vital signs and the nursing notes.  HISTORY  Chief Complaint Shortness of Breath   HPI Dwayne Thompson is a 74 y.o. malewho presents to the ED for evaluation of SOB  Chart review indicates I saw this patient about 1 month ago in the ED for an acute pleural effusion in the setting of recently diagnosed lung cancer.  Patient had left-sided hemothorax requiring chest tube placement by me.  Subsequently had a Pleurx catheter placed.  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  Patient presents to the ED with his wife, who provides additional history, due to increasing shortness of breath and dyspnea on exertion.  Patient reports the symptoms have been progressively worsening and that he is no longer able to get up and walk around his house to perform ADLs as he typically does due to his dyspnea on exertion.  He denies any worsening chest pain, pleuritic pains or discrete worsening of his symptoms, he indicates this has been gradually worsening.  Patient denies any fever, cough, productive cough, abdominal pain, emesis or diarrhea.  They continue to drain serosanguineous fluid from his Pleurx catheter without progression to purulent drainage or increased volume.  We have a long discussion at the bedside about goals of care and prognosis.  Patient indicates that he did not know that his cancer was spread to his thoracic back, which is confirmed on MRI last month.  He indicates that he did not know there was an option about not being treated and he assumed he had to go through chemotherapy.  He indicates that he felt the goal was to "get better."  We talked about goals of care and that he does not have to undergo chemotherapy but does  not want to.  He becomes tearful and indicates that he did not know that his cancer had spread to his back.  They expressed interest in re-discussing his case with oncology and palliative care to discuss his prognosis, to help inform the decision making on how aggressive he wants to be with his treatment.    Past Medical History:  Diagnosis Date  . Anemia   . Back pain   . BPH (benign prostatic hyperplasia)   . Cirrhosis ([place])   . COPD (chronic obstructive pulmonary disease) ([place])   . ED (erectile dysfunction)   . GERD (gastroesophageal reflux disease)   . Hyperlipidemia   . Hypertension     Patient Active Problem List   Diagnosis Date Noted  . Hyperlipidemia   . GERD (gastroesophageal reflux disease)   . COPD (chronic obstructive pulmonary disease) ([place])   . BPH (benign prostatic hyperplasia)   . Poor appetite 07/19/2020  . Pressure injury of buttock, stage 1 07/19/2020  . Pleural effusion 07/15/2020  . Renal insufficiency 07/15/2020  . Normocytic anemia 07/06/2020  . Cancer-related pain 07/06/2020  . Encounter for antineoplastic chemotherapy 07/06/2020  . Encounter for antineoplastic immunotherapy 07/06/2020  . AKI (acute kidney injury) ([place])   . Hypotension   . Small cell lung cancer ([place]) 06/27/2020  . Chest tube in place   . Pressure injury of skin 06/23/2020  . Pleural mass   . Bone metastasis ([place])   . Hemothorax   . Acute on chronic respiratory failure with hypoxia ([place]) 06/22/2020  . Cirrhosis ([place])   .  Hemothorax on left   . Left flank pain 04/23/2020  . Hypertension 03/12/2019  . Thoracic aortic aneurysm ([place]) 03/12/2019  . Abnormal chest CT 02/13/2019  . Difficulty walking 12/06/2018  . Muscle weakness 12/06/2018  . Cellulitis of lower leg 09/17/2018  . CLE (columnar lined esophagus)   . Special screening for malignant neoplasms, colon   . Benign neoplasm of ascending colon   . Polyp of sigmoid colon   . Diverticulosis of large intestine without  diverticulitis   . Iron deficiency anemia due to chronic blood loss 02/18/2018  . Folate deficiency 02/14/2018  . B12 deficiency 01/10/2018  . Folic acid deficiency 40/98/1191  . 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 Helicobacter pylori gastritis (chronic gastritis) 01/10/2018  . Thrombocytopenia ([place]) 01/10/2018  . Internal hemorrhoids   . Acute gastrointestinal hemorrhage   . Stomach irritation   . Duodenal nodule   . Melena 01/01/2018  . Neck pain on right side 03/23/2016  . History of tobacco abuse 03/23/2016  . Cervical lymphadenitis 03/23/2016  . 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 Allergic rhinitis 03/28/2015  . 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 Failure of erection 03/28/2015  . Acid reflux 03/28/2015  . Arthritis, degenerative 03/28/2015  . Change in blood platelet count 03/28/2015  . Temporary cerebral vascular dysfunction 03/28/2015  . Personal history of tobacco use, presenting hazards to health 03/28/2015    Past Surgical History:  Procedure Laterality Date  . COLONOSCOPY Left 01/03/2018   Procedure: COLONOSCOPY;  Surgeon: Virgel Manifold, MD;  Location: [facility] ENDOSCOPY;  Service: Endoscopy;  Laterality: Left;  . COLONOSCOPY WITH PROPOFOL N/A 04/05/2018   Procedure: COLONOSCOPY WITH PROPOFOL;  Surgeon: Virgel Manifold, MD;  Location: [facility] ENDOSCOPY;  Service: Endoscopy;  Laterality: N/A;  . CYSTOSCOPY WITH STENT PLACEMENT Right 09/26/2019   Procedure: CYSTOSCOPY WITH STENT PLACEMENT;  Surgeon: Royston Cowper, MD;  Location: [facility] ORS;  Service: Urology;  Laterality: Right;  . ENTEROSCOPY  01/03/2018   Procedure: ENTEROSCOPY;  Surgeon: Virgel Manifold, MD;  Location: [facility] ENDOSCOPY;  Service: Endoscopy;;  . ESOPHAGOGASTRODUODENOSCOPY N/A 01/02/2018   Procedure:  ESOPHAGOGASTRODUODENOSCOPY (EGD);  Surgeon: Virgel Manifold, MD;  Location: [facility] ENDOSCOPY;  Service: Endoscopy;  Laterality: N/A;  . ESOPHAGOGASTRODUODENOSCOPY (EGD) WITH PROPOFOL N/A 04/05/2018   Procedure: ESOPHAGOGASTRODUODENOSCOPY (EGD) WITH PROPOFOL;  Surgeon: Virgel Manifold, MD;  Location: [facility] ENDOSCOPY;  Service: Endoscopy;  Laterality: N/A;  . exploration and neurolysis of right peroneal nerve at the knee Right 08/27/2018  . GREEN LIGHT LASER TURP (TRANSURETHRAL RESECTION OF PROSTATE N/A 09/26/2019   Procedure: GREEN LIGHT LASER TURP (TRANSURETHRAL RESECTION OF PROSTATE;  Surgeon: Royston Cowper, MD;  Location: [facility] ORS;  Service: Urology;  Laterality: N/A;  . HERNIA REPAIR     1980's  . PORTACATH PLACEMENT Left 06/29/2020   Procedure: INSERTION PORT-A-CATH;  Surgeon: [name], MD;  Location: [facility] ORS;  Service: Thoracic;  Laterality: Left;  Marland Kitchen VIDEO ASSISTED THORACOSCOPY (VATS)/THOROCOTOMY Left 06/29/2020   Procedure: VIDEO ASSISTED THORACOSCOPY;  Surgeon: [name], MD;  Location: [facility] ORS;  Service: Thoracic;  Laterality: Left;    Prior to Admission medications   Medication Sig Start Date End Date Taking? Authorizing Provider  acetaminophen (TYLENOL) 325 MG tablet Take 2 tablets (650 mg total) by mouth 3 (three) times daily. Patient taking differently: Take 650 mg by mouth every 6 (six) hours  as needed for moderate pain.  01/04/18   Salary, Avel Peace, MD  cetirizine (ZYRTEC) 10 MG tablet Take 1 tablet (10 mg total) by mouth daily. 01/30/20   Jerrol Banana., MD  cholecalciferol (VITAMIN D3) 25 MCG (1000 UNIT) tablet Take 1,000 Units by mouth daily.    [provider]  diclofenac Sodium (VOLTAREN) 1 % GEL Apply 1 application topically 4 (four) times daily as needed (pain).    [provider]  dutasteride (AVODART) 0.5 MG capsule Take 0.5 mg by mouth daily.    [provider]  feeding supplement (ENSURE ENLIVE / ENSURE PLUS) LIQD Take  237 mLs by mouth 2 (two) times daily between meals. 07/02/20   Loletha Grayer, MD  folic acid (FOLVITE) 1 MG tablet Take 1 tablet (1 mg total) by mouth daily. 12/15/18   Lequita Asal, MD  lactulose (CHRONULAC) 10 GM/15ML solution Take 45 mLs (30 g total) by mouth 2 (two) times daily. Patient not taking: Reported on 07/13/2020 07/02/20   Loletha Grayer, MD  lidocaine (LIDODERM) 5 % Place 1 patch onto the skin daily. Remove & Discard patch within 12 hours or as directed by MD 07/02/20   Loletha Grayer, MD  Lidocaine (SALONPAS PAIN RELIEVING) 4 % PTCH Apply 1 patch topically every morning. 07/02/20   Loletha Grayer, MD  megestrol (MEGACE) 40 MG/ML suspension Take 5 mLs (200 mg total) by mouth daily. 07/08/20   Lequita Asal, MD  midodrine (PROAMATINE) 10 MG tablet Take 1 tablet (10 mg total) by mouth 4 (four) times daily. 07/02/20   Wieting, Richard, MD  montelukast (SINGULAIR) 10 MG tablet TAKE 1 TABLET BY MOUTH EVERY DAY Patient taking differently: Take 10 mg by mouth daily.  05/12/20   Jerrol Banana., MD  ondansetron (ZOFRAN) 8 MG tablet Take 1 tablet (8 mg total) by mouth 2 (two) times daily as needed for refractory nausea / vomiting. Start on day 3 after carboplatin chemo. 07/07/20   Lequita Asal, MD  oxyCODONE (ROXICODONE) 15 MG immediate release tablet Take 1 tablet (15 mg total) by mouth every 4 (four) hours as needed for pain. 07/09/20   Jerrol Banana., MD  pantoprazole (PROTONIX) 20 MG tablet TAKE ONE TABLET BY MOUTH TWICE A DAY 07/20/20   Jerrol Banana., MD  polyethylene glycol (MIRALAX / GLYCOLAX) 17 g packet Take 17 g by mouth daily as needed for moderate constipation. 07/02/20   Loletha Grayer, MD  potassium chloride (K-DUR,KLOR-CON) 10 MEQ tablet Take 1 tablet (10 mEq total) by mouth 2 (two) times daily. 12/21/18   Jerrol Banana., MD  prochlorperazine (COMPAZINE) 10 MG tablet Take 1 tablet (10 mg total) by mouth every 6 (six) hours  as needed (Nausea or vomiting). 07/07/20   Lequita Asal, MD  rosuvastatin (CRESTOR) 10 MG tablet Take 1 tablet (10 mg total) by mouth daily. 10/28/19   Jerrol Banana., MD  vitamin B-12 1000 MCG tablet Take 1 tablet (1,000 mcg total) by mouth daily. 01/05/18   Salary, Avel Peace, MD    Allergies Indomethacin, Tamsulosin hcl, and Sulfa antibiotics  Family History  Problem Relation Age of Onset  . Alzheimer's disease Mother   . Sarcoidosis Sister   . Anxiety disorder Brother   . Kidney disease Neg Hx   . Prostate cancer Neg Hx     Social History Social History   Tobacco Use  . Smoking status: Former Smoker    Packs/day: 1.00  Years: 50.00    Pack years: 50.00    Types: Cigarettes    Quit date: 10/20/2013    Years since quitting: 6.7  . Smokeless tobacco: Never Used  Vaping Use  . Vaping Use: Never used  Substance Use Topics  . Alcohol use: Not Currently    Comment: 3-4 gin and sodas a day  . Drug use: No    Review of Systems  Constitutional: No fever/chills Eyes: No visual changes. ENT: No sore throat. Cardiovascular: Denies chest pain. Respiratory: positive shortness of breath and DOE Gastrointestinal: No abdominal pain.  No nausea, no vomiting.  No diarrhea.  No constipation. Genitourinary: Negative for dysuria. Musculoskeletal: Positive for back pain Skin: Negative for rash. Neurological: Negative for headaches, focal weakness or numbness.  ____________________________________________   PHYSICAL EXAM:  VITAL SIGNS: There were no vitals filed for this visit.    Constitutional: Alert and oriented.  Chronically ill-appearing, dyspneic and tachypneic to the mid twenties. Eyes: Conjunctivae are normal. PERRL. EOMI. Head: Atraumatic. Nose: No congestion/rhinnorhea. Mouth/Throat: Mucous membranes are dry.  Oropharynx non-erythematous. Neck: No stridor. No cervical spine tenderness to palpation. Cardiovascular: Tachycardic rate, regular rhythm.  Grossly normal heart sounds.  Good peripheral circulation. Respiratory: Normal respiratory effort.  No retractions. Lungs CTAB. Gastrointestinal: Soft , nondistended, nontender to palpation. No abdominal bruits. No CVA tenderness. Musculoskeletal:  No joint effusions. No signs of acute trauma. Pleurx catheter coming from the left chest without erythema or purulence. Mild pitting edema noted bilateral lower extremities. Neurologic:  Normal speech and language. No gross focal neurologic deficits are appreciated. No gait instability noted. Skin:  Skin is warm, dry and intact. No rash noted. Psychiatric: Mood and affect are normal. Speech and behavior are normal.  ____________________________________________   LABS (all labs ordered are listed, but only abnormal results are displayed)  Labs Reviewed  BASIC METABOLIC PANEL - Abnormal; Notable for the following components:      Result Value   Glucose, Bld 125 (*)    All other components within normal limits  CBC - Abnormal; Notable for the following components:   WBC 18.0 (*)    RBC 3.47 (*)    Hemoglobin 10.2 (*)    HCT 30.6 (*)    Platelets 133 (*)    All other components within normal limits  RESPIRATORY PANEL BY RT PCR (FLU A&B, COVID)  LACTIC ACID, PLASMA  BRAIN NATRIURETIC PEPTIDE  TROPONIN I (HIGH SENSITIVITY)  TROPONIN I (HIGH SENSITIVITY)   ____________________________________________  12 Lead EKG   sinus rhythm, rate of 106 bpm.  Normal axis and intervals.  No evidence of acute ischemia. ____________________________________________  RADIOLOGY  ED MD interpretation: Two view CXR reviewed by me with left-sided Pleurx catheter in place, chronic interstitial infiltrates and no evidence of acute cardiopulmonary pathology.  Official radiology report(s): DG Chest 2 View  Result Date: 07/21/2020 CLINICAL DATA:  Shortness of breath. EXAM: CHEST - 2 VIEW COMPARISON:  July 20, 2020. FINDINGS: Similar appearance of a small  partially loculated left pleural effusion with overlying airspace opacities. Similar more subtle opacities in the left upper lobe. Similar position of a left PleurX catheter. The right lung is clear. No visible pneumothorax. Similar cardiomediastinal silhouette, partially obscured. Left subclavian approach Port-A-Cath in similar position. No acute osseous abnormality. IMPRESSION: 1. Similar appearance of a small partially loculated left pleural effusion with overlying airspace opacities. Similar position of a left PleurX catheter. 2. Similar more subtle opacities in the left upper lobe. Electronically Signed   By: Albertina Parr  Adah Salvage MD   On: 07/21/2020 14:25    ____________________________________________   PROCEDURES and INTERVENTIONS  Procedure(s) performed (including Critical Care):  .1-3 Lead EKG Interpretation Performed by: Vladimir Crofts, MD Authorized by: Vladimir Crofts, MD     Interpretation: normal     ECG rate:  94   ECG rate assessment: normal     Rhythm: sinus rhythm     Ectopy: none     Conduction: normal      Medications  morphine 4 MG/ML injection 4 mg (has no administration in time range)    ____________________________________________   MDM / ED COURSE  74 year old patient with metastatic small cell cancer of the lung presents to the ED with progressively worsening shortness of breath, dyspnea on exertion and associated hypoxia requiring observation medical admission.  Patient hypoxic on his as needed 2 L from home, requiring up to 4 L nasal cannula here.  Exam demonstrates a chronically ill-appearing patient who is tachypneic and dyspneic, hypoxic on his home O2.  Blood work demonstrates a new leukocytosis, that may be reactive from his chemotherapy.  He has no fevers cough or productive cough to suggest superimposed bacterial pneumonia.  We will add lactic acid to his work-up to help ascertain the possibility of infection or sepsis.  Admitting hospitalist, Dr. Posey Pronto, is  in the ED and I discussed the case with her prior to this lab resulting.  We will admit the patient to hospitalist medicine for further work-up and management of his progressively worsening cancer pain, shortness of breath, hypoxia and to have discussions with oncology and palliative care about goals of care.     ____________________________________________   FINAL CLINICAL IMPRESSION(S) / ED DIAGNOSES  Final diagnoses:  DOE (dyspnea on exertion)  Hypoxia  Shortness of breath  Small cell lung cancer, left [facility])     ED Discharge Orders    None       Janelys Glassner   Note:  This document was prepared using Dragon voice recognition software and may include unintentional dictation errors.   Vladimir Crofts, MD 07/21/20 1525

## 2020-07-21 NOTE — Telephone Encounter (Signed)
Patients wife called stating that she was worried about her husbands breathing. She states that it is very short.  He wears O2 @2  l and his O2 saturtion is 84, 83 81% with HR 110-109.  She states that he has just been to see Dr Genevive Bi yesterday and he left his pluro vac in place stating that he still has fluid left side. She states it was drained yesterday by Dr Genevive Bi and the instructions are every other day. He has lung Ca and had his first chemo 1-2 weeks ago. Patient ambulated to den and his O2 increased to 92%  HR 99. He still is described as winded by his wife. She states that it isn't this normal. Per protocol patient will go to ER for evaluation. Care advice read to wife.  She verbalized understaning understaning.Wife will call 911 if needed. NT offered assistance.but since oxygenation has improves she will drive him. She has assistance of family.  Reason for Disposition . Cancer treatment in past six months (or has cancer now)  Answer Assessment - Initial Assessment Questions 1. RESPIRATORY STATUS: "Describe your breathing?" (e.g., wheezing, shortness of breath, unable to speak, severe coughing)      SOB 2. ONSET: "When did this breathing problem begin?"      yesterday 3. PATTERN "Does the difficult breathing come and go, or has it been constant since it started?"     constant 4. SEVERITY: "How bad is your breathing?" (e.g., mild, moderate, severe)    - MILD: No SOB at rest, mild SOB with walking, speaks normally in sentences, can lay down, no retractions, pulse < 100.    - MODERATE: SOB at rest, SOB with minimal exertion and prefers to sit, cannot lie down flat, speaks in phrases, mild retractions, audible wheezing, pulse 100-120.    - SEVERE: Very SOB at rest, speaks in single words, struggling to breathe, sitting hunched forward, retractions, pulse > 120      severe 5. RECURRENT SYMPTOM: "Have you had difficulty breathing before?" If Yes, ask: "When was the last time?" and "What happened  that time?"      Hospital plurex catherer sees Dr Genevive Bi 6. CARDIAC HISTORY: "Do you have any history of heart disease?" (e.g., heart attack, angina, bypass surgery, angioplasty)      none 7. LUNG HISTORY: "Do you have any history of lung disease?"  (e.g., pulmonary embolus, asthma, emphysema)    Lung Ca  With one round cemo 8. CAUSE: "What do you think is causing the breathing problem?"      Fluid around lung left with pleurex catherer 9. OTHER SYMPTOMS: "Do you have any other symptoms? (e.g., dizziness, runny nose, cough, chest pain, fever)     none 10. PREGNANCY: "Is there any chance you are pregnant?" "When was your last menstrual period?"     N/A 11. TRAVEL: "Have you traveled out of the country in the last month?" (e.g., travel history, exposures)      N/A  Protocols used: BREATHING DIFFICULTY-A-AH

## 2020-07-21 NOTE — Telephone Encounter (Signed)
Per Dr Mike Gip, patient is going to ER after speaking to him and his wife

## 2020-07-21 NOTE — Progress Notes (Signed)
[place] for Lovenox Indication: DVT  Allergies  Allergen Reactions  . Indomethacin Swelling    ankle swelling  . Tamsulosin Hcl Hives  . Sulfa Antibiotics Rash    Patient Measurements: Height: 5\' 11"  (180.3 cm) Weight: 80 kg (176 lb 5.9 oz) IBW/kg (Calculated) : 75.3  Vital Signs: BP: 93/58 (11/02 1900) Pulse Rate: 95 (11/02 1900)  Labs: Recent Labs    07/21/20 1333 07/21/20 1818  HGB 10.2*  --   HCT 30.6*  --   PLT 133*  --   CREATININE 0.90  --   TROPONINIHS 12 12    Estimated Creatinine Clearance: 76.7 mL/min (by C-G formula based on SCr of 0.9 mg/dL).   Medical History: Past Medical History:  Diagnosis Date  . Anemia   . Back pain   . BPH (benign prostatic hyperplasia)   . Cirrhosis ([place])   . COPD (chronic obstructive pulmonary disease) ([place])   . ED (erectile dysfunction)   . GERD (gastroesophageal reflux disease)   . Hyperlipidemia   . Hypertension     Assessment: 74 year old male presented to the ED for SOB. Patient with recent diagnosis of lung cancer, follows with oncology and on chemotherapy. Patient with bilateral DVT involving the popliteal and calf veins. Pharmacy to start Lovenox.    Goal of Therapy:  Monitor platelets by anticoagulation protocol: Yes   Plan:  Lovenox 1 mg/kg (80 mg) q12h. Will follow CBC q72h at minimum.  Tawnya Crook, PharmD 07/21/2020,7:47 PM

## 2020-07-21 NOTE — ED Notes (Signed)
Unsuccessful IV attempt.

## 2020-07-22 ENCOUNTER — Encounter: Payer: Self-pay | Admitting: Internal Medicine

## 2020-07-22 ENCOUNTER — Encounter
Admission: EM | Disposition: A | Discharge status: Home / Self Care | Payer: Self-pay | Source: Home / Self Care | Attending: Internal Medicine

## 2020-07-22 ENCOUNTER — Observation Stay (HOSPITAL_COMMUNITY)
Admit: 2020-07-22 | Discharge: 2020-07-22 | Disposition: A | Discharge status: Home / Self Care | Payer: Medicare Other | Attending: Internal Medicine | Admitting: Internal Medicine

## 2020-07-22 ENCOUNTER — Observation Stay: Payer: Medicare Other

## 2020-07-22 ENCOUNTER — Other Ambulatory Visit: Payer: Self-pay

## 2020-07-22 DIAGNOSIS — I82403 Acute embolism and thrombosis of unspecified deep veins of lower extremity, bilateral: Secondary | ICD-10-CM | POA: Diagnosis not present

## 2020-07-22 DIAGNOSIS — Z66 Do not resuscitate: Secondary | ICD-10-CM | POA: Diagnosis not present

## 2020-07-22 DIAGNOSIS — I9589 Other hypotension: Secondary | ICD-10-CM | POA: Diagnosis present

## 2020-07-22 DIAGNOSIS — E785 Hyperlipidemia, unspecified: Secondary | ICD-10-CM | POA: Diagnosis present

## 2020-07-22 DIAGNOSIS — J9621 Acute and chronic respiratory failure with hypoxia: Secondary | ICD-10-CM | POA: Diagnosis present

## 2020-07-22 DIAGNOSIS — I2699 Other pulmonary embolism without acute cor pulmonale: Secondary | ICD-10-CM | POA: Diagnosis present

## 2020-07-22 DIAGNOSIS — R6520 Severe sepsis without septic shock: Secondary | ICD-10-CM | POA: Diagnosis present

## 2020-07-22 DIAGNOSIS — C7951 Secondary malignant neoplasm of bone: Secondary | ICD-10-CM | POA: Diagnosis present

## 2020-07-22 DIAGNOSIS — N138 Other obstructive and reflux uropathy: Secondary | ICD-10-CM | POA: Diagnosis present

## 2020-07-22 DIAGNOSIS — C3490 Malignant neoplasm of unspecified part of unspecified bronchus or lung: Secondary | ICD-10-CM | POA: Diagnosis present

## 2020-07-22 DIAGNOSIS — Z515 Encounter for palliative care: Secondary | ICD-10-CM

## 2020-07-22 DIAGNOSIS — J910 Malignant pleural effusion: Secondary | ICD-10-CM | POA: Diagnosis present

## 2020-07-22 DIAGNOSIS — K219 Gastro-esophageal reflux disease without esophagitis: Secondary | ICD-10-CM | POA: Diagnosis present

## 2020-07-22 DIAGNOSIS — R0602 Shortness of breath: Secondary | ICD-10-CM | POA: Diagnosis present

## 2020-07-22 DIAGNOSIS — Z6824 Body mass index (BMI) 24.0-24.9, adult: Secondary | ICD-10-CM | POA: Diagnosis not present

## 2020-07-22 DIAGNOSIS — A419 Sepsis, unspecified organism: Secondary | ICD-10-CM | POA: Diagnosis present

## 2020-07-22 DIAGNOSIS — I824Z3 Acute embolism and thrombosis of unspecified deep veins of distal lower extremity, bilateral: Secondary | ICD-10-CM | POA: Diagnosis present

## 2020-07-22 DIAGNOSIS — I119 Hypertensive heart disease without heart failure: Secondary | ICD-10-CM | POA: Diagnosis present

## 2020-07-22 DIAGNOSIS — E43 Unspecified severe protein-calorie malnutrition: Secondary | ICD-10-CM | POA: Diagnosis present

## 2020-07-22 DIAGNOSIS — J188 Other pneumonia, unspecified organism: Secondary | ICD-10-CM | POA: Diagnosis present

## 2020-07-22 DIAGNOSIS — L89312 Pressure ulcer of right buttock, stage 2: Secondary | ICD-10-CM | POA: Diagnosis present

## 2020-07-22 DIAGNOSIS — J449 Chronic obstructive pulmonary disease, unspecified: Secondary | ICD-10-CM | POA: Diagnosis present

## 2020-07-22 DIAGNOSIS — N401 Enlarged prostate with lower urinary tract symptoms: Secondary | ICD-10-CM | POA: Diagnosis present

## 2020-07-22 DIAGNOSIS — K7460 Unspecified cirrhosis of liver: Secondary | ICD-10-CM | POA: Diagnosis present

## 2020-07-22 DIAGNOSIS — I82433 Acute embolism and thrombosis of popliteal vein, bilateral: Secondary | ICD-10-CM | POA: Diagnosis present

## 2020-07-22 DIAGNOSIS — Z20822 Contact with and (suspected) exposure to covid-19: Secondary | ICD-10-CM | POA: Diagnosis present

## 2020-07-22 HISTORY — PX: PULMONARY THROMBECTOMY: CATH118295

## 2020-07-22 LAB — ECHOCARDIOGRAM COMPLETE
AR max vel: 2.63 cm2
AV Area VTI: 3.46 cm2
AV Area mean vel: 2.74 cm2
AV Mean grad: 4 mmHg
AV Peak grad: 7.8 mmHg
Ao pk vel: 1.4 m/s
Area-P 1/2: 3.6 cm2
Height: 71 in
S' Lateral: 2.66 cm
Weight: 2821.89 oz

## 2020-07-22 LAB — APTT: aPTT: 31 seconds (ref 24–36)

## 2020-07-22 LAB — CBC WITH DIFFERENTIAL/PLATELET
Abs Immature Granulocytes: 1.57 10*3/uL — ABNORMAL HIGH (ref 0.00–0.07)
Basophils Absolute: 0.1 10*3/uL (ref 0.0–0.1)
Basophils Relative: 0 %
Eosinophils Absolute: 0 10*3/uL (ref 0.0–0.5)
Eosinophils Relative: 0 %
HCT: 27.5 % — ABNORMAL LOW (ref 39.0–52.0)
Hemoglobin: 9.1 g/dL — ABNORMAL LOW (ref 13.0–17.0)
Immature Granulocytes: 8 %
Lymphocytes Relative: 7 %
Lymphs Abs: 1.3 10*3/uL (ref 0.7–4.0)
MCH: 29.4 pg (ref 26.0–34.0)
MCHC: 33.1 g/dL (ref 30.0–36.0)
MCV: 88.7 fL (ref 80.0–100.0)
Monocytes Absolute: 1.2 10*3/uL — ABNORMAL HIGH (ref 0.1–1.0)
Monocytes Relative: 6 %
Neutro Abs: 15.5 10*3/uL — ABNORMAL HIGH (ref 1.7–7.7)
Neutrophils Relative %: 79 %
Platelets: 129 10*3/uL — ABNORMAL LOW (ref 150–400)
RBC: 3.1 MIL/uL — ABNORMAL LOW (ref 4.22–5.81)
RDW: 14.4 % (ref 11.5–15.5)
Smear Review: NORMAL
WBC: 19.6 10*3/uL — ABNORMAL HIGH (ref 4.0–10.5)
nRBC: 0 % (ref 0.0–0.2)

## 2020-07-22 LAB — PROCALCITONIN
Procalcitonin: 0.19 ng/mL
Procalcitonin: <0.1 ng/mL

## 2020-07-22 LAB — PROTIME-INR
INR: 1.1 (ref 0.8–1.2)
Prothrombin Time: 13.9 seconds (ref 11.4–15.2)

## 2020-07-22 LAB — HEPARIN LEVEL (UNFRACTIONATED): Heparin Unfractionated: 0.19 IU/mL — ABNORMAL LOW (ref 0.30–0.70)

## 2020-07-22 SURGERY — PULMONARY THROMBECTOMY
Anesthesia: Moderate Sedation

## 2020-07-22 MED ORDER — OXYCODONE HCL 5 MG PO TABS
15.0000 mg | ORAL_TABLET | ORAL | Status: DC | PRN
  Administered 2020-07-22 – 2020-07-25 (×8): 15 mg via ORAL
  Filled 2020-07-22 (×8): qty 3

## 2020-07-22 MED ORDER — MIDAZOLAM HCL 2 MG/2ML IJ SOLN
INTRAMUSCULAR | Status: DC | PRN
  Administered 2020-07-22 (×2): 1 mg via INTRAVENOUS

## 2020-07-22 MED ORDER — PANTOPRAZOLE SODIUM 20 MG PO TBEC
20.0000 mg | DELAYED_RELEASE_TABLET | Freq: Two times a day (BID) | ORAL | Status: DC
  Administered 2020-07-22 – 2020-07-25 (×6): 20 mg via ORAL
  Filled 2020-07-22 (×8): qty 1

## 2020-07-22 MED ORDER — SODIUM CHLORIDE 0.9 % IV SOLN: INTRAVENOUS | Status: DC

## 2020-07-22 MED ORDER — HEPARIN (PORCINE) 25000 UT/250ML-% IV SOLN
1600.0000 [IU]/h | INTRAVENOUS | Status: AC
  Administered 2020-07-22 (×2): 1400 [IU]/h via INTRAVENOUS
  Administered 2020-07-23 – 2020-07-25 (×4): 1700 [IU]/h via INTRAVENOUS
  Filled 2020-07-22 (×6): qty 250

## 2020-07-22 MED ORDER — HEPARIN BOLUS VIA INFUSION
5000.0000 [IU] | Freq: Once | INTRAVENOUS | Status: AC
  Administered 2020-07-22: 5000 [IU] via INTRAVENOUS
  Filled 2020-07-22: qty 5000

## 2020-07-22 MED ORDER — MIDAZOLAM HCL 5 MG/5ML IJ SOLN
INTRAMUSCULAR | Status: AC
  Filled 2020-07-22: qty 5

## 2020-07-22 MED ORDER — DUTASTERIDE 0.5 MG PO CAPS
0.5000 mg | ORAL_CAPSULE | Freq: Every day | ORAL | Status: DC
  Administered 2020-07-22 – 2020-07-25 (×4): 0.5 mg via ORAL
  Filled 2020-07-22 (×5): qty 1

## 2020-07-22 MED ORDER — HEPARIN BOLUS VIA INFUSION
2400.0000 [IU] | Freq: Once | INTRAVENOUS | Status: AC
  Administered 2020-07-23: 2400 [IU] via INTRAVENOUS
  Filled 2020-07-22: qty 2400

## 2020-07-22 MED ORDER — MEGESTROL ACETATE 40 MG/ML PO SUSP
200.0000 mg | Freq: Every day | ORAL | Status: DC
  Administered 2020-07-22 – 2020-07-25 (×4): 200 mg via ORAL
  Filled 2020-07-22 (×5): qty 5

## 2020-07-22 MED ORDER — ROSUVASTATIN CALCIUM 10 MG PO TABS
10.0000 mg | ORAL_TABLET | Freq: Every day | ORAL | Status: DC
  Administered 2020-07-22 – 2020-07-25 (×4): 10 mg via ORAL
  Filled 2020-07-22 (×5): qty 1

## 2020-07-22 MED ORDER — CHLORHEXIDINE GLUCONATE CLOTH 2 % EX PADS
6.0000 | MEDICATED_PAD | Freq: Every day | CUTANEOUS | Status: DC
  Administered 2020-07-22 – 2020-07-25 (×4): 6 via TOPICAL
  Filled 2020-07-22: qty 6

## 2020-07-22 MED ORDER — FENTANYL CITRATE (PF) 100 MCG/2ML IJ SOLN
INTRAMUSCULAR | Status: AC
  Filled 2020-07-22: qty 2

## 2020-07-22 MED ORDER — HEPARIN SODIUM (PORCINE) 1000 UNIT/ML IJ SOLN
INTRAMUSCULAR | Status: AC
  Filled 2020-07-22: qty 1

## 2020-07-22 MED ORDER — MIDODRINE HCL 5 MG PO TABS
10.0000 mg | ORAL_TABLET | Freq: Four times a day (QID) | ORAL | Status: DC
  Administered 2020-07-22 – 2020-07-25 (×11): 10 mg via ORAL
  Filled 2020-07-22 (×15): qty 2

## 2020-07-22 MED ORDER — IOHEXOL 350 MG/ML SOLN
75.0000 mL | Freq: Once | INTRAVENOUS | Status: AC | PRN
  Administered 2020-07-22: 75 mL via INTRAVENOUS
  Filled 2020-07-22: qty 75

## 2020-07-22 MED ORDER — SODIUM CHLORIDE 0.9% FLUSH
10.0000 mL | Freq: Two times a day (BID) | INTRAVENOUS | Status: DC
  Administered 2020-07-22 – 2020-07-23 (×3): 10 mL
  Administered 2020-07-23: 20 mL
  Administered 2020-07-24 – 2020-07-25 (×2): 10 mL

## 2020-07-22 MED ORDER — FENTANYL CITRATE (PF) 100 MCG/2ML IJ SOLN
INTRAMUSCULAR | Status: DC | PRN
  Administered 2020-07-22: 50 ug via INTRAVENOUS
  Administered 2020-07-22: 25 ug via INTRAVENOUS

## 2020-07-22 MED ORDER — ACETAMINOPHEN 325 MG PO TABS: 650.0000 mg | ORAL_TABLET | Freq: Four times a day (QID) | ORAL | Status: DC | PRN

## 2020-07-22 MED ORDER — SODIUM CHLORIDE 0.9% FLUSH
10.0000 mL | INTRAVENOUS | Status: DC | PRN
  Administered 2020-07-22: 10 mL

## 2020-07-22 SURGICAL SUPPLY — 15 items
CANISTER PENUMBRA ENGINE (MISCELLANEOUS) ×2 IMPLANT
CANNULA 5F STIFF (CANNULA) ×2 IMPLANT
CATH ANGIO 5F PIGTAIL 100CM (CATHETERS) ×2 IMPLANT
CATH INDIGO 12XTORQ 100 (CATHETERS) ×2 IMPLANT
CATH INDIGO SEP 12 (CATHETERS) ×2 IMPLANT
CATH INFINITI JR4 5F (CATHETERS) ×2 IMPLANT
COVER PROBE U/S 5X48 (MISCELLANEOUS) ×2 IMPLANT
GLIDEWIRE ADV .035X180CM (WIRE) ×2 IMPLANT
PACK ANGIOGRAPHY (CUSTOM PROCEDURE TRAY) ×3 IMPLANT
SHEATH BRITE TIP 8FRX11 (SHEATH) ×2 IMPLANT
SHEATH PINNACLE 11FRX10 (SHEATH) ×2 IMPLANT
SYR MEDRAD MARK 7 150ML (SYRINGE) ×2 IMPLANT
TUBING CONTRAST HIGH PRESS 72 (TUBING) ×4 IMPLANT
WIRE J 3MM .035X145CM (WIRE) ×2 IMPLANT
WIRE MAGIC TORQUE 260C (WIRE) ×4 IMPLANT

## 2020-07-22 NOTE — Telephone Encounter (Signed)
Patient did go to ED yesterday.

## 2020-07-22 NOTE — Progress Notes (Signed)
ANTICOAGULATION CONSULT NOTE - Initial Consult  Pharmacy Consult for Heparin  Indication: DVT  Allergies  Allergen Reactions  . Indomethacin Swelling    ankle swelling  . Tamsulosin Hcl Hives  . Sulfa Antibiotics Rash    Patient Measurements: Height: 5\' 11"  (180.3 cm) Weight: 80 kg (176 lb 5.9 oz) IBW/kg (Calculated) : 75.3 Heparin Dosing Weight: 80 kg   Vital Signs: Temp: 98 F (36.7 C) (11/02 2141) Temp Source: Oral (11/02 2141) BP: 113/62 (11/03 0100) Pulse Rate: 104 (11/03 0305)  Labs: Recent Labs    07/21/20 1333 07/21/20 1818  HGB 10.2*  --   HCT 30.6*  --   PLT 133*  --   CREATININE 0.90  --   TROPONINIHS 12 12    Estimated Creatinine Clearance: 76.7 mL/min (by C-G formula based on SCr of 0.9 mg/dL).   Medical History: Past Medical History:  Diagnosis Date  . Anemia   . Back pain   . BPH (benign prostatic hyperplasia)   . Cirrhosis ([place])   . COPD (chronic obstructive pulmonary disease) ([place])   . ED (erectile dysfunction)   . GERD (gastroesophageal reflux disease)   . Hyperlipidemia   . Hypertension     Medications:  (Not in a hospital admission)   Assessment: Pharmacy consulted to dose heparin in this 74 year old male admitted with DVT.   Pt was originally ordered therapeutic lovenox but has been switched to UFH in anticipation of vascular surgery.  CrCl = 76.7 ml/min   Goal of Therapy:  Heparin level 0.3-0.7 units/ml Monitor platelets by anticoagulation protocol: Yes   Plan:  Give 5000 units bolus x 1 Start heparin infusion at 1400  units/hr Check anti-Xa level in 8 hours and daily while on heparin Continue to monitor H&H and platelets  Presli Fanguy D 07/22/2020,3:59 AM

## 2020-07-22 NOTE — ED Notes (Signed)
Stark Klein, NP at bedside.

## 2020-07-22 NOTE — Consult Note (Signed)
[facility] Vascular Consult Note  MRN : [id number]  Dwayne Thompson is a 74 y.o. (Apr 09, 1946) male who presents with chief complaint of  Chief Complaint  Patient presents with  . Shortness of Breath  .  History of Present Illness: I am asked to see the patient by E. Ouma for evaluation of large right sided PE in a patient who presents with profound hypoxia with oxygen saturations in the 70s, some tachycardia, and significant dyspnea and shortness of breath with minimal activity.  This acutely worsened yesterday.  He does have lung cancer with poor pulmonary reserve and is requiring oxygen with reasonably good improvement with his oxygen saturations into the 90s on 4 L of oxygen.  If he does any activity he becomes very short of breath.  He was started on anticoagulation which has helped his symptoms slightly, but he continues to require oxygen and be short of breath.  I have independently reviewed his CT angiogram and he does have large right-sided pulmonary emboli involving the distal main pulmonary artery and all 3 primary lobar branches.  There is significant left lower lobe consolidation but I do not appreciate any pulmonary emboli on the left.  He does have evidence of right heart strain on the CT scan.  Current Facility-Administered Medications  Medication Dose Route Frequency Provider Last Rate Last Admin  . 0.9 %  sodium chloride infusion  250 mL Intravenous PRN Florina Ou V, MD      . 0.9 %  sodium chloride infusion   Intravenous Continuous Lucky Cowboy, Erskine Squibb, MD      . acetaminophen (TYLENOL) tablet 650 mg  650 mg Oral Q6H PRN Dessa Phi, DO      . azithromycin (ZITHROMAX) 500 mg in sodium chloride 0.9 % 250 mL IVPB  500 mg Intravenous Q24H Para Skeans, MD   Stopped at 07/21/20 2346  . cefTRIAXone (ROCEPHIN) 1 g in sodium chloride 0.9 % 100 mL IVPB  1 g Intravenous Q24H Para Skeans, MD   Stopped at 07/21/20 2235  . Chlorhexidine Gluconate Cloth 2 % PADS 6 each   6 each Topical Daily Florina Ou V, MD      . dutasteride (AVODART) capsule 0.5 mg  0.5 mg Oral Daily Dessa Phi, DO      . heparin ADULT infusion 100 units/mL (25000 units/240mL sodium chloride 0.45%)  1,400 Units/hr Intravenous Continuous Para Skeans, MD 14 mL/hr at 07/22/20 0407 1,400 Units/hr at 07/22/20 0407  . ipratropium-albuterol (DUONEB) 0.5-2.5 (3) MG/3ML nebulizer solution 3 mL  3 mL Nebulization Q6H Para Skeans, MD   3 mL at 07/21/20 1628  . ipratropium-albuterol (DUONEB) 0.5-2.5 (3) MG/3ML nebulizer solution 3 mL  3 mL Nebulization Q6H PRN Para Skeans, MD      . megestrol (MEGACE) 40 MG/ML suspension 200 mg  200 mg Oral Daily Dessa Phi, DO      . midodrine (PROAMATINE) tablet 10 mg  10 mg Oral QID Dessa Phi, DO      . oxyCODONE (Oxy IR/ROXICODONE) immediate release tablet 15 mg  15 mg Oral Q4H PRN Dessa Phi, DO   15 mg at 07/22/20 0837  . pantoprazole (PROTONIX) EC tablet 20 mg  20 mg Oral BID Dessa Phi, DO      . rosuvastatin (CRESTOR) tablet 10 mg  10 mg Oral Daily Dessa Phi, DO      . sodium chloride flush (NS) 0.9 % injection 10-40 mL  10-40 mL Intracatheter Q12H  Para Skeans, MD      . sodium chloride flush (NS) 0.9 % injection 10-40 mL  10-40 mL Intracatheter PRN Florina Ou V, MD      . sodium chloride flush (NS) 0.9 % injection 3 mL  3 mL Intravenous Q12H Para Skeans, MD   3 mL at 07/21/20 2220  . sodium chloride flush (NS) 0.9 % injection 3 mL  3 mL Intravenous PRN Para Skeans, MD       Current Outpatient Medications  Medication Sig Dispense Refill  . acetaminophen (TYLENOL) 325 MG tablet Take 2 tablets (650 mg total) by mouth 3 (three) times daily. (Patient taking differently: Take 650 mg by mouth every 6 (six) hours as needed for moderate pain. ) 180 tablet 0  . cholecalciferol (VITAMIN D3) 25 MCG (1000 UNIT) tablet Take 1,000 Units by mouth daily.    . diclofenac Sodium (VOLTAREN) 1 % GEL Apply 1 application topically 4 (four) times  daily as needed (pain).    Marland Kitchen dutasteride (AVODART) 0.5 MG capsule Take 0.5 mg by mouth daily.    . feeding supplement (ENSURE ENLIVE / ENSURE PLUS) LIQD Take 237 mLs by mouth 2 (two) times daily between meals. 51761 mL 0  . folic acid (FOLVITE) 1 MG tablet Take 1 tablet (1 mg total) by mouth daily. 30 tablet 0  . lactulose (CHRONULAC) 10 GM/15ML solution Take 45 mLs (30 g total) by mouth 2 (two) times daily. 1992 mL 0  . lidocaine (LIDODERM) 5 % Place 1 patch onto the skin daily. Remove & Discard patch within 12 hours or as directed by MD 30 patch 0  . Lidocaine (SALONPAS PAIN RELIEVING) 4 % PTCH Apply 1 patch topically every morning. 30 patch 0  . loratadine (CLARITIN) 10 MG tablet Take 10 mg by mouth daily.    . megestrol (MEGACE) 40 MG/ML suspension Take 5 mLs (200 mg total) by mouth daily. 150 mL 1  . midodrine (PROAMATINE) 10 MG tablet Take 1 tablet (10 mg total) by mouth 4 (four) times daily. 120 tablet 0  . montelukast (SINGULAIR) 10 MG tablet TAKE 1 TABLET BY MOUTH EVERY DAY (Patient taking differently: Take 10 mg by mouth daily. ) 90 tablet 3  . ondansetron (ZOFRAN) 8 MG tablet Take 1 tablet (8 mg total) by mouth 2 (two) times daily as needed for refractory nausea / vomiting. Start on day 3 after carboplatin chemo. 30 tablet 1  . oxyCODONE (ROXICODONE) 15 MG immediate release tablet Take 1 tablet (15 mg total) by mouth every 4 (four) hours as needed for pain. 120 tablet 0  . pantoprazole (PROTONIX) 20 MG tablet TAKE ONE TABLET BY MOUTH TWICE A DAY 60 tablet 5  . polyethylene glycol (MIRALAX / GLYCOLAX) 17 g packet Take 17 g by mouth daily as needed for moderate constipation. 30 each 0  . potassium chloride (K-DUR,KLOR-CON) 10 MEQ tablet Take 1 tablet (10 mEq total) by mouth 2 (two) times daily. 180 tablet 3  . prochlorperazine (COMPAZINE) 10 MG tablet Take 1 tablet (10 mg total) by mouth every 6 (six) hours as needed (Nausea or vomiting). 30 tablet 1  . rosuvastatin (CRESTOR) 10 MG tablet  Take 1 tablet (10 mg total) by mouth daily. 90 tablet 3  . vitamin B-12 1000 MCG tablet Take 1 tablet (1,000 mcg total) by mouth daily. 180 tablet 1   Facility-Administered Medications Ordered in Other Encounters  Medication Dose Route Frequency Provider Last Rate Last Admin  . sodium chloride  flush (NS) 0.9 % injection 10 mL  10 mL Intracatheter PRN Lequita Asal, MD   10 mL at 07/06/20 7106    Past Medical History:  Diagnosis Date  . Anemia   . Back pain   . BPH (benign prostatic hyperplasia)   . Cirrhosis ([place])   . COPD (chronic obstructive pulmonary disease) ([place])   . ED (erectile dysfunction)   . GERD (gastroesophageal reflux disease)   . Hyperlipidemia   . Hypertension     Past Surgical History:  Procedure Laterality Date  . COLONOSCOPY Left 01/03/2018   Procedure: COLONOSCOPY;  Surgeon: Virgel Manifold, MD;  Location: [facility] ENDOSCOPY;  Service: Endoscopy;  Laterality: Left;  . COLONOSCOPY WITH PROPOFOL N/A 04/05/2018   Procedure: COLONOSCOPY WITH PROPOFOL;  Surgeon: Virgel Manifold, MD;  Location: [facility] ENDOSCOPY;  Service: Endoscopy;  Laterality: N/A;  . CYSTOSCOPY WITH STENT PLACEMENT Right 09/26/2019   Procedure: CYSTOSCOPY WITH STENT PLACEMENT;  Surgeon: Royston Cowper, MD;  Location: [facility] ORS;  Service: Urology;  Laterality: Right;  . ENTEROSCOPY  01/03/2018   Procedure: ENTEROSCOPY;  Surgeon: Virgel Manifold, MD;  Location: [facility] ENDOSCOPY;  Service: Endoscopy;;  . ESOPHAGOGASTRODUODENOSCOPY N/A 01/02/2018   Procedure: ESOPHAGOGASTRODUODENOSCOPY (EGD);  Surgeon: Virgel Manifold, MD;  Location: [facility] ENDOSCOPY;  Service: Endoscopy;  Laterality: N/A;  . ESOPHAGOGASTRODUODENOSCOPY (EGD) WITH PROPOFOL N/A 04/05/2018   Procedure: ESOPHAGOGASTRODUODENOSCOPY (EGD) WITH PROPOFOL;  Surgeon: Virgel Manifold, MD;  Location: [facility] ENDOSCOPY;  Service: Endoscopy;  Laterality: N/A;  . exploration and neurolysis of right peroneal nerve at the knee Right  08/27/2018  . GREEN LIGHT LASER TURP (TRANSURETHRAL RESECTION OF PROSTATE N/A 09/26/2019   Procedure: GREEN LIGHT LASER TURP (TRANSURETHRAL RESECTION OF PROSTATE;  Surgeon: Royston Cowper, MD;  Location: [facility] ORS;  Service: Urology;  Laterality: N/A;  . HERNIA REPAIR     1980's  . PORTACATH PLACEMENT Left 06/29/2020   Procedure: INSERTION PORT-A-CATH;  Surgeon: [name], MD;  Location: [facility] ORS;  Service: Thoracic;  Laterality: Left;  Marland Kitchen VIDEO ASSISTED THORACOSCOPY (VATS)/THOROCOTOMY Left 06/29/2020   Procedure: VIDEO ASSISTED THORACOSCOPY;  Surgeon: [name], MD;  Location: [facility] ORS;  Service: Thoracic;  Laterality: Left;     Social History   Tobacco Use  . Smoking status: Former Smoker    Packs/day: 1.00    Years: 50.00    Pack years: 50.00    Types: Cigarettes    Quit date: 10/20/2013    Years since quitting: 6.7  . Smokeless tobacco: Never Used  Vaping Use  . Vaping Use: Never used  Substance Use Topics  . Alcohol use: Not Currently    Comment: 3-4 gin and sodas a day  . Drug use: No     Family History  Problem Relation Age of Onset  . Alzheimer's disease Mother   . Sarcoidosis Sister   . Anxiety disorder Brother   . Kidney disease Neg Hx   . Prostate cancer Neg Hx     Allergies  Allergen Reactions  . Indomethacin Swelling    ankle swelling  . Tamsulosin Hcl Hives  . Sulfa Antibiotics Rash     REVIEW OF SYSTEMS (Negative unless checked)  Constitutional: [] Weight loss  [] Fever  [] Chills Cardiac: [x] Chest pain   [] Chest pressure   [] Palpitations   [x] Shortness of breath when laying flat   [x] Shortness of breath at rest   [x] Shortness of breath with exertion. Vascular:  [] Pain in legs with walking   [] Pain in legs at rest   []   Pain in legs when laying flat   [] Claudication   [] Pain in feet when walking  [] Pain in feet at rest  [] Pain in feet when laying flat   [x] History of DVT   [x] Phlebitis   [] Swelling in legs   [] Varicose veins   [] Non-healing  ulcers Pulmonary:   [] Uses home oxygen   [] Productive cough   [] Hemoptysis   [] Wheeze  [x] COPD   [] Asthma Neurologic:  [] Dizziness  [] Blackouts   [] Seizures   [] History of stroke   [] History of TIA  [] Aphasia   [] Temporary blindness   [] Dysphagia   [] Weakness or numbness in arms   [] Weakness or numbness in legs Musculoskeletal:  [x] Arthritis   [] Joint swelling   [] Joint pain   [x] Low back pain Hematologic:  [] Easy bruising  [] Easy bleeding   [] Hypercoagulable state   [x] Anemic  [] Hepatitis Gastrointestinal:  [] Blood in stool   [] Vomiting blood  [x] Gastroesophageal reflux/heartburn   [] Difficulty swallowing. Genitourinary:  [] Chronic kidney disease   [] Difficult urination  [] Frequent urination  [] Burning with urination   [] Blood in urine Skin:  [] Rashes   [] Ulcers   [] Wounds Psychological:  [] History of anxiety   []  History of major depression.  Physical Examination  Vitals:   07/22/20 0600 07/22/20 0630 07/22/20 0700 07/22/20 0715  BP: 95/64 102/72 98/65 106/67  Pulse: 94 96 97 (!) 104  Resp: (!) 29 18 19  (!) 21  Temp:    98 F (36.7 C)  TempSrc:    Oral  SpO2: 96% 99% 98% 97%  Weight:      Height:       Body mass index is 24.6 kg/m. Gen:  WD/WN, NAD Head: [place]/AT, No temporalis wasting. Ear/Nose/Throat: Hearing grossly intact, nares w/o erythema or drainage, oropharynx w/o Erythema/Exudate Eyes: Sclera non-icteric, conjunctiva clear Neck: Trachea midline.  No JVD.  Pulmonary:  Good air movement, respirations are labored even on supplemental oxygen Cardiac: tachycardic, but regular Vascular:  Vessel Right Left  Radial Palpable Palpable   Musculoskeletal: M/S 5/5 throughout.  Extremities without ischemic changes.  No deformity or atrophy. No edema. Neurologic: Sensation grossly intact in extremities.  Symmetrical.  Speech is fluent. Motor exam as listed above. Psychiatric: Judgment intact, Mood & affect appropriate for pt's clinical situation. Dermatologic: No rashes or ulcers  noted.  No cellulitis or open wounds.       CBC Lab Results  Component Value Date   WBC 19.6 (H) 07/22/2020   HGB 9.1 (L) 07/22/2020   HCT 27.5 (L) 07/22/2020   MCV 88.7 07/22/2020   PLT 129 (L) 07/22/2020    BMET    Component Value Date/Time   NA 135 07/21/2020 1333   NA 138 06/17/2020 1316   K 3.8 07/21/2020 1333   CL 101 07/21/2020 1333   CO2 22 07/21/2020 1333   GLUCOSE 125 (H) 07/21/2020 1333   BUN 17 07/21/2020 1333   BUN 51 (H) 06/17/2020 1316   CREATININE 0.90 07/21/2020 1333   CALCIUM 8.9 07/21/2020 1333   GFRNONAA >60 07/21/2020 1333   GFRAA 59 (L) 06/23/2020 0507   Estimated Creatinine Clearance: 76.7 mL/min (by C-G formula based on SCr of 0.9 mg/dL).  COAG Lab Results  Component Value Date   INR 1.1 07/22/2020   INR 1.0 06/28/2020   INR 1.2 06/22/2020    Radiology DG Chest 2 View  Result Date: 07/21/2020 CLINICAL DATA:  Pleural effusion, PleurX catheter in place. EXAM: CHEST - 2 VIEW COMPARISON:  July 13, 2020. FINDINGS: Similar appearance of a small  partially loculated left pleural effusion with overlying airspace opacities. Similar more subtle opacities in the left upper lobe. Similar position of a left PleurX catheter. The right lung is clear. No visible pneumothorax. Similar cardiomediastinal silhouette, partially obscured. Left subclavian approach Port-A-Cath in similar position. No acute osseous abnormality. IMPRESSION: 1. Similar appearance of a small partially loculated left pleural effusion with overlying airspace opacities. Similar position of a left PleurX catheter. 2. Similar more subtle opacities in the left upper lobe. Electronically Signed   By: Margaretha Sheffield MD   On: 07/21/2020 14:26   DG Chest 2 View  Result Date: 07/21/2020 CLINICAL DATA:  Shortness of breath. EXAM: CHEST - 2 VIEW COMPARISON:  July 20, 2020. FINDINGS: Similar appearance of a small partially loculated left pleural effusion with overlying airspace opacities.  Similar more subtle opacities in the left upper lobe. Similar position of a left PleurX catheter. The right lung is clear. No visible pneumothorax. Similar cardiomediastinal silhouette, partially obscured. Left subclavian approach Port-A-Cath in similar position. No acute osseous abnormality. IMPRESSION: 1. Similar appearance of a small partially loculated left pleural effusion with overlying airspace opacities. Similar position of a left PleurX catheter. 2. Similar more subtle opacities in the left upper lobe. Electronically Signed   By: Margaretha Sheffield MD   On: 07/21/2020 14:25   DG Chest 2 View  Result Date: 07/13/2020 CLINICAL DATA:  Pleural effusion EXAM: CHEST - 2 VIEW COMPARISON:  June 29, 2020 FINDINGS: PleurX catheter again noted on the left with tip directed posteriorly. There is partially loculated left pleural effusion. There is airspace consolidation throughout mid and lower lung regions on the left. The right lung is clear. Heart is slightly enlarged with pulmonary vascularity normal. No adenopathy. No pneumothorax. There is bullous disease in the extreme right apex. No bone lesions. Port-A-Cath tip in superior vena cava. IMPRESSION: PleurX catheter present. Left pleural effusion appears similar on the left, partially loculated. Airspace opacity throughout the mid and lower lung regions. Slightly less opacity in the left upper lobe compared to most recent study. Right lung essentially clear. Stable cardiac silhouette. Electronically Signed   By: Lowella Grip III M.D.   On: 07/13/2020 10:10   DG Chest 2 View  Result Date: 06/22/2020 CLINICAL DATA:  Shortness of breath.  Hypotensive. EXAM: CHEST - 2 VIEW COMPARISON:  06/17/2020 FINDINGS: Complete opacification of the left lung with rightward shift of the mediastinum. Linear opacities in the right lung base. No visible pneumothorax. Cardiomediastinal silhouette is largely obscured. No acute osseous abnormality. IMPRESSION: 1. Complete  opacification of the left lung, likely related to a large pleural effusion (increased from prior) and possibly overlying consolidation. There is rightward mediastinal shift. Correlate for evidence of tension physiology. 2. Linear opacities in the right lung base, which could represent atelectasis, aspiration, and/or pneumonia. Electronically Signed   By: Margaretha Sheffield MD   On: 06/22/2020 11:45   CT ANGIO CHEST PE W OR WO CONTRAST  Result Date: 07/22/2020 CLINICAL DATA:  Shortness of breath, lung cancer EXAM: CT ANGIOGRAPHY CHEST WITH CONTRAST TECHNIQUE: Multidetector CT imaging of the chest was performed using the standard protocol during bolus administration of intravenous contrast. Multiplanar CT image reconstructions and MIPs were obtained to evaluate the vascular anatomy. CONTRAST:  35mL OMNIPAQUE IOHEXOL 350 MG/ML SOLN COMPARISON:  06/05/2020 FINDINGS: Cardiovascular: Filling defects seen within multiple right pulmonary arteries in all lobes of the right lung compatible with pulmonary emboli. No definite pulmonary emboli on the left. RV: LV ratio is mildly  elevated at 1.0. Heart is mildly enlarged. 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 Coronary artery and aortic calcifications. Mediastinum/Nodes: Enlarging prevascular lymph nodes since prior study, measuring up to 10 mm currently, not measurable on prior study. Subcarinal adenopathy has increased, measuring 2 cm in short axis diameter compared to 1.5 cm previously. Abnormal left hilar lymph nodes, increasing since prior study. Lungs/Pleura: Small loculated pleural effusion again noted, similar prior study with PleurX catheter in place. Pleural soft tissue masses are noted throughout the left pleural space. Moderate emphysema. No confluent opacity or effusion on the right. Masslike area of airspace disease/consolidation sound in the left lower lobe, encases the left lower lobe pulmonary arteries and measures 6.6 x 5.6 cm. Upper Abdomen:  Bulky mass anteriorly in the left cardiophrenic region and extending between to left ribs has enlarged, measuring 4.9 x 3.9 cm compared to 4.4 x 3.2 cm previously. Small hypodensities throughout the liver compatible with cysts. Gastrohepatic ligament adenopathy worsened with short axis diameter measuring up 2 1.5 cm compared with 1.2 cm previously. Musculoskeletal: Left Port-A-Cath in place. No acute bony abnormality. Review of the MIP images confirms the above findings. IMPRESSION: Pulmonary emboli seen within all lobes of the right lung. Slightly elevated RV: LV ratio compatible with right heart strain. Worsening masslike consolidation in the left lower lobe. Pleural nodularity noted throughout the left pleural space with small loculated left pleural effusion. Worsening mediastinal and upper abdominal adenopathy. Worsening left anterior cardiophrenic/abdominal wall mass which extends between 2 of the left ribs. Coronary artery disease. Aortic Atherosclerosis (ICD10-I70.0) and Emphysema (ICD10-J43.9). These results were called by telephone at the time of interpretation on 07/22/2020 at 1:05 am to provider Rufina Falco , who verbally acknowledged these results. Electronically Signed   By: Rolm Baptise M.D.   On: 07/22/2020 01:06   MR BRAIN W WO CONTRAST  Result Date: 06/29/2020 CLINICAL DATA:  Initial evaluation for small cell lung cancer, staging. EXAM: MRI HEAD WITHOUT AND WITH CONTRAST TECHNIQUE: Multiplanar, multiecho pulse sequences of the brain and surrounding structures were obtained without and with intravenous contrast. CONTRAST:  7.56mL GADAVIST GADOBUTROL 1 MMOL/ML IV SOLN COMPARISON:  Prior MRI from 03/04/2013. FINDINGS: Brain: Examination degraded by motion artifact. Age-related cerebral atrophy with mild-to-moderate chronic microvascular ischemic disease. No acute intracranial infarct. Gray-white matter differentiation maintained. No encephalomalacia to suggest chronic cortical infarction. No foci  of susceptibility artifact to suggest acute or chronic intracranial hemorrhage. No mass lesion, midline shift or mass effect. No hydrocephalus or extra-axial fluid collection. Pituitary gland suprasellar region normal. Midline structures intact. No abnormal enhancement or findings to suggest intracranial metastatic disease. Vascular: Major intracranial vascular flow voids are maintained. Skull and upper cervical spine: Craniocervical junction within normal limits. No focal marrow replacing lesion. Multilevel degenerative spondylolysis noted within the upper cervical spine. No scalp soft tissue abnormality. Sinuses/Orbits: Globes and orbital soft tissues demonstrate no acute finding. Paranasal sinuses are largely clear. No mastoid effusion. Inner ear structures grossly normal. Other: None. IMPRESSION: 1. No evidence for intracranial metastatic disease. 2. No other acute intracranial abnormality. 3. Mild to moderate chronic microvascular ischemic disease. Electronically Signed   By: Jeannine Boga M.D.   On: 06/29/2020 03:01   MR THORACIC SPINE W WO CONTRAST  Result Date: 06/29/2020 CLINICAL DATA:  Initial evaluation for non-small cell lung cancer, pretreatment staging. Abnormality T11-12. EXAM: MRI THORACIC WITHOUT AND WITH CONTRAST TECHNIQUE: Multiplanar and multiecho pulse sequences of the thoracic spine were obtained without and with intravenous contrast. CONTRAST:  7.36mL  GADAVIST GADOBUTROL 1 MMOL/ML IV SOLN COMPARISON:  Prior PET-CT from 06/12/2020. FINDINGS: MRI THORACIC SPINE FINDINGS Alignment: Vertebral bodies normally aligned with preservation of the normal thoracic kyphosis. No listhesis. Vertebrae: Signal intensity within the visualized bone marrow is diffusely heterogeneous. A left paraspinous soft tissue mass is seen along the left anterolateral aspect of the vertebral column extending from approximately T9-10 through T12-L1 (series 20, image 18). Evidence for invasion/involvement of the  adjacent T10 and T11 vertebral bodies (series 21, image 14). Tumor partially extends into the left neural foramen at T11-12 (series 40, image 34). No other significant extension into the spinal canal or epidural tumor. A separate 13 mm metastasis seen involving the central/right aspect of the T10 vertebral body (series 39, image 8). Probable additional 13 mm metastatic lesion seen at T1 (series 20, image 9). 1 cm lesion seen within the right pedicle of T6 (series 21, image 6). No associated pathologic fracture. Cord:  Normal signal and morphology.  No significant epidural tumor. Paraspinal and other soft tissues: Paraspinous tumor as above. Paraspinous soft tissues demonstrate no other acute finding. Irregular and partially loculated left pleural effusion with scattered fluid-fluid levels partially visualized at the left lung base. Consolidative changes noted throughout the posterior left lung superiorly. Disc levels: Normal expected multilevel degenerative disc desiccation seen within the thoracic spine. Mild disc bulging noted at the T7-8 level without significant spinal stenosis. Multilevel facet hypertrophy throughout the lower thoracic spine. No significant stenosis or foraminal encroachment. IMPRESSION: 1. 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 Tumor partially extends into the left neural foramen at T11-12. No other significant extension into the spinal canal or epidural tumor. 2. Additional metastatic lesions involving the T1 and T10 vertebral bodies as well as the right pedicle of T6. No associated pathologic fracture. 3. Heterogeneous and partially loculated left pleural effusion with consolidation signal intensity throughout the visualized left lower lung. Electronically Signed   By: Jeannine Boga M.D.   On: 06/29/2020 03:18   US Venous Img Lower Bilateral (DVT)  Result Date:  07/21/2020 CLINICAL DATA:  Lower extremity edema EXAM: BILATERAL LOWER EXTREMITY VENOUS DOPPLER ULTRASOUND TECHNIQUE: Gray-scale sonography with graded compression, as well as color Doppler and duplex ultrasound were performed to evaluate the lower extremity deep venous systems from the level of the common femoral vein and including the common femoral, femoral, profunda femoral, popliteal and calf veins including the posterior tibial, peroneal and gastrocnemius veins when visible. The superficial great saphenous vein was also interrogated. Spectral Doppler was utilized to evaluate flow at rest and with distal augmentation maneuvers in the common femoral, femoral and popliteal veins. COMPARISON:  None. FINDINGS: RIGHT LOWER EXTREMITY Common Femoral Vein: No evidence of thrombus. Normal compressibility, respiratory phasicity and response to augmentation. Saphenofemoral Junction: No evidence of thrombus. Normal compressibility and flow on color Doppler imaging. Profunda Femoral Vein: No evidence of thrombus. Normal compressibility and flow on color Doppler imaging. Femoral Vein: No evidence of thrombus. Normal compressibility, respiratory phasicity and response to augmentation. Popliteal Vein: Thrombus is noted with decreased compressibility. Calf Veins: Thrombus is noted within the posterior tibial and peroneal veins with decreased compressibility. Superficial Great Saphenous Vein: No evidence of thrombus. Normal compressibility. Venous Reflux:  None. Other Findings:  None. LEFT LOWER EXTREMITY Common Femoral Vein: No evidence of thrombus. Normal compressibility, respiratory phasicity and response to augmentation. Saphenofemoral Junction: No evidence of thrombus. Normal compressibility and flow on color Doppler imaging.  Profunda Femoral Vein: No evidence of thrombus. Normal compressibility and flow on color Doppler imaging. Femoral Vein: No evidence of thrombus. Normal compressibility, respiratory phasicity and  response to augmentation. Popliteal Vein: Thrombus is noted with decreased compressibility. Calf Veins: Peroneal vein demonstrates thrombus with decreased compressibility. Superficial Great Saphenous Vein: No evidence of thrombus. Normal compressibility. Venous Reflux:  None. Other Findings:  None. IMPRESSION: Bilateral deep venous thrombosis involving the popliteal and calf veins. Electronically Signed   By: Inez Catalina M.D.   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 ANESTHESIA/SEDATION: Moderate (conscious) sedation was employed during this procedure. A total of Versed 0.5 mg and Fentanyl 25 mcg was administered intravenously. Moderate Sedation Time: 10 minutes. The patient's level of consciousness and vital signs were monitored continuously by radiology nursing throughout the procedure under my direct supervision. FLUOROSCOPY TIME:  CT COMPLICATIONS: None PROCEDURE: The procedure, risks, benefits, and alternatives were explained to the patient and the patient's family. Specific risks that were addressed included bleeding, infection, pneumothorax, need for further procedure including chest tube placement, chance of delayed pneumothorax or hemorrhage, hemoptysis, nondiagnostic sample, cardiopulmonary collapse, death. Questions regarding the procedure were encouraged and answered. The patient understands and consents to the procedure. Patient was positioned in the supine position on the CT gantry table and a scout CT of the chest was performed for planning purposes. Once angle of approach was determined, the skin and subcutaneous tissues this scan was prepped and draped in the usual sterile fashion, and a sterile drape was applied covering the operative field. A sterile gown and sterile gloves were used for the procedure. Local  anesthesia was provided with 1% Lidocaine. The skin and subcutaneous tissues were infiltrated 1% lidocaine for local anesthesia, and a small stab incision was made with an 11 blade scalpel. Using CT guidance, a 17 gauge trocar needle was advanced into the left lower anterior chest walltarget. After confirmation of the tip, separate 18 gauge core biopsies were performed. These were placed into solution for transportation to the lab. Final CT was acquired Patient tolerated the procedure well and remained hemodynamically stable throughout. No complications were encountered and no significant blood loss was encounter IMPRESSION: Status post CT-guided biopsy of left chest wall mass, FDG avid on prior PET. Signed, Dulcy Fanny. Dellia Nims, RPVI [facility] Radiology Electronically Signed   By: Corrie Mckusick D.O.   On: 06/24/2020 12:20   DG Chest Port 1 View  Result Date: 06/29/2020 CLINICAL DATA:  Status post Port-A-Cath placement. EXAM: PORTABLE CHEST 1 VIEW COMPARISON:  June 25, 2020. FINDINGS: Stable cardiomegaly. Interval placement of left subclavian Port-A-Cath with distal tip in expected position of cavoatrial junction. No pneumothorax pleural effusion is noted. Right lung is clear. Left pleural effusion is significantly smaller status post pleural drainage catheter placement. Diffuse left lung opacity is noted concerning for atelectasis or possibly inflammation. Bony thorax is unremarkable. IMPRESSION: Interval placement of left subclavian Port-A-Cath with distal tip in expected position of cavoatrial junction. Left pleural effusion is significantly smaller status post pleural drainage catheter placement. Electronically Signed   By: Marijo Conception M.D.   On: 06/29/2020 13:30   DG Chest Port 1 View  Result Date: 06/25/2020 CLINICAL DATA:  Chest tube. EXAM: PORTABLE CHEST 1 VIEW COMPARISON:  06/24/2020. FINDINGS: Left chest tube in stable  position. No  pneumothorax. Prominent skin folds again noted on the right. Persistent left base atelectasis/infiltrate. Small left pleural effusion. No pneumothorax. Cardiomegaly. Degenerative changes scoliosis thoracic spine. IMPRESSION: 1.  Left chest tube in stable position. No pneumothorax. 2. Persistent left base atelectasis/infiltrate. Small left pleural effusion. Similar findings on prior exam. Electronically Signed   By: Hendricks   On: 06/25/2020 05:01   DG Chest Port 1 View  Result Date: 06/24/2020 CLINICAL DATA:  Chest tube. EXAM: PORTABLE CHEST 1 VIEW COMPARISON:  June 22, 2020. FINDINGS: Stable cardiomegaly. Stable position of left-sided chest tube. Moderate left pleural effusion is noted which is significantly enlarged compared to prior exam. Atelectasis or infiltrate is noted in the remaining portion of the aerated left lung. Right lung is clear. No pneumothorax is noted. Bony thorax is unremarkable. IMPRESSION: Stable position of left-sided chest tube. Moderate left pleural effusion is noted which is significantly enlarged compared to prior exam. Atelectasis or infiltrate is noted in the remaining portion of the aerated left lung. Electronically Signed   By: Marijo Conception M.D.   On: 06/24/2020 08:43   DG Chest Port 1 View  Result Date: 06/22/2020 CLINICAL DATA:  Postop check.  Chest tube placement. EXAM: PORTABLE CHEST 1 VIEW COMPARISON:  Radiograph earlier this day.  Most recent CT 06/06/2019 FINDINGS: Pigtail catheter projects over the left mid lung. No visualized pneumothorax. Improved hazy opacity at the left lung base consistent with decreasing pleural effusion and associated airspace disease/atelectasis. Small volume of pleural fluid and or thickening persists. Slight increase in right infrahilar atelectasis. Stable heart size and mediastinal contours. IMPRESSION: 1. Left pigtail catheter in place. No visualized pneumothorax. 2. Improved hazy opacity at the left lung base consistent with  decreasing pleural effusion and associated airspace disease/atelectasis. Small volume of pleural fluid and or thickening persists. Electronically Signed   By: Keith Rake M.D.   On: 06/22/2020 15:44   DG Chest Portable 1 View  Result Date: 06/22/2020 CLINICAL DATA:  Chest tube placement EXAM: PORTABLE CHEST 1 VIEW COMPARISON:  June 22, 2020 study obtained earlier in the day FINDINGS: Chest tube present on the left with considerable diminution in size of left pleural effusion. There is a fairly small residual left pleural effusion with patchy atelectasis in the left mid and lower lung regions. Lungs elsewhere are clear. Heart is upper normal in size with pulmonary vascularity normal. No adenopathy. No pneumothorax. No bone lesions. IMPRESSION: Left pleural effusion considerably smaller after chest tube placement. There is a fairly small persistent left pleural effusion with atelectatic change in the left mid and lower lung regions. Lungs otherwise clear. Heart upper normal in size. Electronically Signed   By: Lowella Grip III M.D.   On: 06/22/2020 13:49   DG C-Arm 1-60 Min-No Report  Result Date: 06/29/2020 Fluoroscopy was utilized by the requesting physician.  No radiographic interpretation.      Assessment/Plan 1.  Large right sided pulmonary embolus involving all 3 primary lobar arteries.  There is evidence of right heart strain on the CT scan.  The patient has very poor pulmonary reserve secondary to his lung cancer as well, and is hypoxic requiring significant oxygen and has shortness of breath with minimal activity.  Pulmonary thrombectomy reasonable option at this point. Will plan to do today.  Risks and benefits are discussed and the patient is agreeable to proceed. 2.  Lung cancer.  Produces poor pulmonary reserve.  Also has COPD. Oncology following. 3.  History of GI  bleed. Is currently anemic but no obvious bleeding currently. We will avoid giving TPA for the procedure and just  perform thrombectomy alone. 4.  Hypertension. Stable on outpatient medications and blood pressure control important in reducing the progression of atherosclerotic disease. On appropriate oral medications. 5.  Hyperlipidemia. lipid control important in reducing the progression of atherosclerotic disease. Continue statin therapy    [name], MD  07/22/2020 10:29 AM    This note was created with Dragon medical transcription system.  Any error is purely unintentional

## 2020-07-22 NOTE — Progress Notes (Signed)
ANTICOAGULATION CONSULT NOTE - Initial Consult  Pharmacy Consult for Heparin  Indication: DVT  Allergies  Allergen Reactions  . Indomethacin Swelling    ankle swelling  . Tamsulosin Hcl Hives  . Sulfa Antibiotics Rash    Patient Measurements: Height: 5\' 11"  (180.3 cm) Weight: 80 kg (176 lb 5.9 oz) IBW/kg (Calculated) : 75.3 Heparin Dosing Weight: 80 kg   Vital Signs: Temp: 97.9 F (36.6 C) (11/03 1110) Temp Source: Oral (11/03 1110) BP: 91/63 (11/03 1430) Pulse Rate: 90 (11/03 1445)  Labs: Recent Labs    07/21/20 1333 07/21/20 1818 07/22/20 0400  HGB 10.2*  --  9.1*  HCT 30.6*  --  27.5*  PLT 133*  --  129*  APTT  --   --  31  LABPROT  --   --  13.9  INR  --   --  1.1  CREATININE 0.90  --   --   TROPONINIHS 12 12  --     Estimated Creatinine Clearance: 76.7 mL/min (by C-G formula based on SCr of 0.9 mg/dL).   Medical History: Past Medical History:  Diagnosis Date  . Anemia   . Back pain   . BPH (benign prostatic hyperplasia)   . Cirrhosis ([place])   . COPD (chronic obstructive pulmonary disease) ([place])   . ED (erectile dysfunction)   . GERD (gastroesophageal reflux disease)   . Hyperlipidemia   . Hypertension     Medications:  Medications Prior to Admission  Medication Sig Dispense Refill Last Dose  . acetaminophen (TYLENOL) 325 MG tablet Take 2 tablets (650 mg total) by mouth 3 (three) times daily. (Patient taking differently: Take 650 mg by mouth every 6 (six) hours as needed for moderate pain. ) 180 tablet 0 Past Month at prn  . cholecalciferol (VITAMIN D3) 25 MCG (1000 UNIT) tablet Take 1,000 Units by mouth daily.   Past Week at Unknown time  . dutasteride (AVODART) 0.5 MG capsule Take 0.5 mg by mouth daily.   Past Week at Unknown time  . feeding supplement (ENSURE ENLIVE / ENSURE PLUS) LIQD Take 237 mLs by mouth 2 (two) times daily between meals. 14220 mL 0 Past Week at Unknown time  . folic acid (FOLVITE) 1 MG tablet Take 1 tablet (1 mg total) by  mouth daily. 30 tablet 0 Past Week at Unknown time  . lactulose (CHRONULAC) 10 GM/15ML solution Take 45 mLs (30 g total) by mouth 2 (two) times daily. 1992 mL 0 Past Week at Unknown time  . lidocaine (LIDODERM) 5 % Place 1 patch onto the skin daily. Remove & Discard patch within 12 hours or as directed by MD 30 patch 0 Past Month at Unknown time  . Lidocaine (SALONPAS PAIN RELIEVING) 4 % PTCH Apply 1 patch topically every morning. 30 patch 0 Past Month at Unknown time  . loratadine (CLARITIN) 10 MG tablet Take 10 mg by mouth daily.   Past Week at Unknown time  . megestrol (MEGACE) 40 MG/ML suspension Take 5 mLs (200 mg total) by mouth daily. 150 mL 1 Past Week at Unknown time  . midodrine (PROAMATINE) 10 MG tablet Take 1 tablet (10 mg total) by mouth 4 (four) times daily. 120 tablet 0 07/21/2020 at Unknown time  . montelukast (SINGULAIR) 10 MG tablet TAKE 1 TABLET BY MOUTH EVERY DAY (Patient taking differently: Take 10 mg by mouth daily. ) 90 tablet 3 Past Week at Unknown time  . ondansetron (ZOFRAN) 8 MG tablet Take 1 tablet (8 mg total)  by mouth 2 (two) times daily as needed for refractory nausea / vomiting. Start on day 3 after carboplatin chemo. 30 tablet 1 Past Month at Unknown time  . oxyCODONE (ROXICODONE) 15 MG immediate release tablet Take 1 tablet (15 mg total) by mouth every 4 (four) hours as needed for pain. 120 tablet 0 07/21/2020 at 0930  . pantoprazole (PROTONIX) 20 MG tablet TAKE ONE TABLET BY MOUTH TWICE A DAY 60 tablet 5 Past Week at Unknown time  . polyethylene glycol (MIRALAX / GLYCOLAX) 17 g packet Take 17 g by mouth daily as needed for moderate constipation. 30 each 0 Past Week at prn  . potassium chloride (K-DUR,KLOR-CON) 10 MEQ tablet Take 1 tablet (10 mEq total) by mouth 2 (two) times daily. 180 tablet 3 Past Week at Unknown time  . prochlorperazine (COMPAZINE) 10 MG tablet Take 1 tablet (10 mg total) by mouth every 6 (six) hours as needed (Nausea or vomiting). 30 tablet 1 Past  Month at prn  . rosuvastatin (CRESTOR) 10 MG tablet Take 1 tablet (10 mg total) by mouth daily. 90 tablet 3 Past Month at Unknown time  . vitamin B-12 1000 MCG tablet Take 1 tablet (1,000 mcg total) by mouth daily. 180 tablet 1 Past Month at Unknown time  . diclofenac Sodium (VOLTAREN) 1 % GEL Apply 1 application topically 4 (four) times daily as needed (pain). (Patient not taking: Reported on 07/22/2020)   Not Taking at prn    Assessment: Pharmacy consulted to dose heparin in this 74 year old male admitted with DVT.   Pt was originally ordered therapeutic lovenox but has been switched to UFH in anticipation of vascular surgery.  CrCl = 76.7 ml/min   Pt initially ordered Give 5000 units bolus x 1 Heparin infusion at 1400  units/hr First anti-Xa level ordered for 1200 today but not drawn.  Pt transferred to Cath Lab.  Heparin drip stopped at 1313 and restarted at 1442 following procedure.  Goal of Therapy:  Heparin level 0.3-0.7 units/ml Monitor platelets by anticoagulation protocol: Yes   Plan: Ordered new HL for 2100.Give 5000 units bolus x 1 Start heparin infusion at 1400  units/hr Check anti-Xa level every 8 hours until therapeutic x 2, then daily while on heparin.  Continue to monitor H&H and platelets.  CBC ordered with AM labs.  Renda Rolls, PharmD, [facility] 07/22/2020 3:37 PM

## 2020-07-22 NOTE — ED Notes (Signed)
Stark Klein, RN updating patient's wife via phone.

## 2020-07-22 NOTE — Progress Notes (Signed)
*  PRELIMINARY RESULTS* Echocardiogram 2D Echocardiogram has been performed.  Sherrie Sport 07/22/2020, 9:09 AM

## 2020-07-22 NOTE — Progress Notes (Signed)
PT Cancellation Note  Patient Details Name: Dwayne Thompson MRN: [id number] DOB: [birth year]   Cancelled Treatment:    Reason Eval/Treat Not Completed: Medical issues which prohibited therapy.  PT consult received.  Chart reviewed.  Pt noted with B LE DVT's and large R sided PE; per chart plan for pulmonary thrombectomy today.  Will hold PT at this time and re-attempt PT evaluation at a later date/time as medically appropriate.  Leitha Bleak, PT 07/22/20, 9:24 AM

## 2020-07-22 NOTE — Progress Notes (Addendum)
BRIEF OVERNIGHT PROGRESS REPORT   BRIEF PATIENT DESCRIPTION: 74 year old male with PMH of COPD, cirrhosis, GERD, HLD, HTN, GI bleed due to AVM, thrombocytopenia, anemia, EtOH abuse, hemothorax, recurrent pleural effusion status post thoracentesis x3 with recent diagnosis of extensive small cell lung cancer  on chemo who presneted to [facility] ED with worsening shortness of breath, hypoxia, tachycardia and tachypnea.  SUBJECTIVE: Notified by patient's primary RN that patient would like to talk to provider regarding his symptoms.  He is very frustrated with the lack of communication and would like clarification on his diagnosis.  On arrival to the bedside he was very short of breath and tachypneic.  I discussed the result of his ultrasound lower extremity that was positive for bilateral DVT.  I informed him that I  was going to obtain CT chest due to concerns of possible pulmonary embolism.  I reassured patient that we will continue to update him on his diagnosis and treatment plan  OBJECTIVE: On my exam, he was afebrile with blood pressure 113/62 mm Hg and pulse rate 101 beats/min. There were no focal neurological deficits; he was alert and oriented x4, but very short of breath and unable to complete full sentences.  ASSESSMENT & PLAN:  Acute  On Chronic Hypoxic Respiratory Failure secondary to [place] with recurrent pleural effusion (Pleurx catheter in place), pulmonary embolism, and pneumonia [id number] without evidence of acute exacerbation CT chest obtained and showed right pulmonary embolism within all lobes of the right lung with right heart strain, worsening masslike consolidation in the left lower lobe with small left pleural effusion  P: -Supplemental O2 as needed to maintain O2 saturations 88 to 92% -Follow intermittent ABG  -As needed bronchodilators -Pleurx catheter in place with minimal output. Will continue to monitor output.  Pulmonary Embolism / Bilateral  DVT  Extremity DVT US  -positive for bilateral DVT. CT Angiogram PE positive for PE as above Due to his High bleed risk: Age>65, prior GI bleeds due to AVMs, thrombocytopenia, reduced functional capacity, anemia, malignancy, hx EtOH abuse, I discussed treatment options with his oncologist  On call Dr. Tasia Catchings and vascular Dr. Lucky Cowboy with recommendations as below.  P: -Start anticoagulation with heparin -TTEcho to evaluate R heart strain  -Bleeding precautions -Consult Vascular for possible thrombectomy /IVC Filter placement. Plan for thrombectomy in the am per Dr. Lucky Cowboy  Probable pneumonia? Leukocytosis, mild lactic acidosis. Chest xray showed airspace opacities in the left upper lobe.  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  Followed with oncology who obtain MRI of the brain with no evidence of metastatic disease.  Patient started on carboplatin, etoposide, and Tecentriq with Neulasta support.  P: -Continue with cancer related pain management -We will obtain oncology consult -Palliative consult    Family Communication: I had an extensive discussion with patient and patient's wife over the phone regarding his diagnosis is as above.  He understood that he has extensive small cell lung cancer with mets and now with new diagnosis of pulmonary embolism and DVT.  We discussed treatment options as outlined above after consultation with on-call oncologist and vascular surgeon Dr. Lucky Cowboy.  Patient stated that he would like to proceed with the treatment as outlined above however should his condition deteriorates he would not want resuscitative efforts or life prolonging measures.  I discussed this with wife as well in the presence of patient's primary RN.  Patient opted to be made DNR at this  point.    Rufina Falco, BSN, MSN, DNP, CCRN, FNP-C [employer], broadcasting Between 7pm to [place] [employer] [phone]

## 2020-07-22 NOTE — ED Notes (Addendum)
Stark Klein, NP communicating closely with RN and reassessing patient. NP gave verbal order to hold Lovenox at this time.

## 2020-07-22 NOTE — Progress Notes (Signed)
Patient admitted to unit from cath lab. VSS at time of admission. IVF and heparin infusing. Access site [email or web address]. Patient a/o.

## 2020-07-22 NOTE — Progress Notes (Signed)
OT Cancellation Note  Patient Details Name: NAFTULA DONAHUE MRN: [id number] DOB: 1946/05/10   Cancelled Treatment:    Reason Eval/Treat Not Completed: Medical issues which prohibited therapy;Patient not medically ready. OT order received and chart reviewed. Pt with new PE and plan for pulmonary thrombectomy today. Pt will be held today. OT will follow up when pt is able to medically participate in therapeutic intervention safely.    Darleen Crocker, [place], OTR/L , CBIS ascom [phone]  07/22/20, 9:03 AM   07/22/2020, 9:01 AM

## 2020-07-22 NOTE — Op Note (Signed)
07/22/2020,1:47 PM [facility]  Percutaneous Study/Intervention Procedural Note   Date of Surgery: 07/22/2020,1:47 PM  Surgeon: [name]  Pre-operative Diagnosis: Symptomatic right pulmonary emboli, right heart strain, lung cancer  Post-operative diagnosis:  Same  Procedure(s) Performed:  1.  Contrast injection right heart  2.  Mechanical thrombectomy right main, upper lobe, middle lobe, and lower lobe pulmonary arteries  3.  Selective catheter placement right upper lobe, middle lobe, and lower lobe pulmonary arteries      Anesthesia: Conscious sedation was administered under my direct supervision by the interventional radiology RN. IV Versed plus fentanyl were utilized. Continuous ECG, pulse oximetry and blood pressure was monitored throughout the entire procedure.  Versed and fentanyl were administered intravenously.  Conscious sedation was administered for a total of 23 minutes using 3 of Versed and 50 mcg of Fentanyl.  EBL: 375 cc  Sheath: 11 French right femoral vein  Contrast: 30 cc   Fluoroscopy Time: 5 minutes  Indications:  Patient presents with pulmonary emboli. The patient is symptomatic with hypoxemia and dyspnea on exertion.  There is evidence of right heart strain on the CT angiogram.  He has lung cancer and COPD with very poor pulmonary reserve and has marked hypoxia.  The patient is otherwise a good candidate for intervention and even the long-term benefits pulmonary angiography with thrombolysis is offered. The risks and benefits are reviewed long-term benefits are discussed. All questions are answered patient agrees to proceed.  Procedure:  Dwayne Belmontes Mooreis a 74 y.o. male who was identified and appropriate procedural time out was performed.  The patient was then placed supine on the table and prepped and draped in the usual sterile fashion.  Ultrasound was used to evaluate the right common femoral vein.  It was patent, as it was echolucent  and compressible.  A digital ultrasound image was acquired for the permanent record.  A Seldinger needle was used to access the right common femoral vein under direct ultrasound guidance.  A 0.035 J wire was advanced without resistance and a 5Fr sheath was placed and then upsized to an 11 [place] sheath.    The wire and pigtail catheter were then negotiated into the right atrium and bolus injection of contrast was utilized to demonstrate the right ventricle and the pulmonary artery outflow. The wire and catheter were then negotiated into the main pulmonary artery where hand injection of contrast was utilized to demonstrate the pulmonary arteries and confirm the locations of the pulmonary emboli.  The JR4 catheter was first put in the right upper lobe pulmonary artery where selective imaging showed significant thrombosis.  Imaging of the right middle lobe was then performed showing significant thrombus burden as well.  The right lower lobe had a moderate amount of thrombus burden on selective imaging.  I then placed a Magic torque wire and brought up the CAT 12 device.  The patient had been on a heparin drip so no additional heparin was given and TPA was not used due to his anemia, previous GI bleed, and cancer with metastases.  The Penumbra Cat 12 catheter was then advanced up into the pulmonary vasculature. Catheter was negotiated into the right main and then the right upper lobe pulmonary artery first and mechanical thrombectomy was performed. Follow-up imaging demonstrated a good result and therefore the catheter was renegotiated into the right middle lobe pulmonary artery and again mechanical thrombectomy was performed.  Imaging showed marked improvement so I turned my attention to the  right lower lobe.  The penumbra CAT 12 catheter was taken down to the right lower lobe where thrombectomy was performed.  Passes were made with both the Penumbra catheter itself as well as introducing the separator in all 3  vessels. Follow-up imaging was then performed.  Only a small amount of residual thrombus was seen in the right middle and upper lobes and no significant thrombus burden was seen in the right lower lobe.   After review of these images, wires were reintroduced and the catheters removed. Then, the sheath is then pulled and pressures held. A safeguard is placed.    Findings:   Right heart imaging:  Right atrium and right ventricle and the pulmonary outflow tract appears moderately dilated  Right lung: Significant thrombus burden is seen in the right distal main, upper lobe, middle lobe, and lower lobe pulmonary arteries.      Disposition: Patient was taken to the recovery room in stable condition having tolerated the procedure well.  Dwayne Thompson 07/22/2020,1:47 PM

## 2020-07-22 NOTE — Progress Notes (Signed)
Pt is resting quietly. Pt is tolerating PO fluids. Wife has been updated with status and room assignment.

## 2020-07-22 NOTE — Progress Notes (Signed)
Echo and Vascular at bedside.

## 2020-07-22 NOTE — ED Notes (Signed)
Pt became hypoxic on 4L of oxygen via [place] while standing to use urinal. Oxygen saturation dropped to 87% but then rose to 94% when laying back in bed. Stark Klein, NP aware.

## 2020-07-22 NOTE — ED Notes (Signed)
Pt called wife and requested Provider call wife to update. Pt updated staff will call wife after consult with vascular. Pt verbalized understanding.

## 2020-07-22 NOTE — ED Notes (Signed)
Pt transported to CT. Pt in NAD at time of transport.

## 2020-07-22 NOTE — Consult Note (Signed)
Full consult to follow In short, large right sided PE involving all three lobes in a patient with lung cancer and poor pulmonary reserve.  Hypoxia and SOB with any activity. Right heart strain on CT. Echo being done now.  Plan pulmonary thrombectomy today without tpa due to GI bleed  history and cancer Continue anticoagulation

## 2020-07-22 NOTE — H&P (View-Only) (Signed)
[facility] Vascular Consult Note  MRN : [id number]  RORAN WEGNER is a 74 y.o. (1946-03-23) male who presents with chief complaint of  Chief Complaint  Patient presents with  . Shortness of Breath  .  History of Present Illness: I am asked to see the patient by E. Ouma for evaluation of large right sided PE in a patient who presents with profound hypoxia with oxygen saturations in the 70s, some tachycardia, and significant dyspnea and shortness of breath with minimal activity.  This acutely worsened yesterday.  He does have lung cancer with poor pulmonary reserve and is requiring oxygen with reasonably good improvement with his oxygen saturations into the 90s on 4 L of oxygen.  If he does any activity he becomes very short of breath.  He was started on anticoagulation which has helped his symptoms slightly, but he continues to require oxygen and be short of breath.  I have independently reviewed his CT angiogram and he does have large right-sided pulmonary emboli involving the distal main pulmonary artery and all 3 primary lobar branches.  There is significant left lower lobe consolidation but I do not appreciate any pulmonary emboli on the left.  He does have evidence of right heart strain on the CT scan.  Current Facility-Administered Medications  Medication Dose Route Frequency Provider Last Rate Last Admin  . 0.9 %  sodium chloride infusion  250 mL Intravenous PRN Florina Ou V, MD      . 0.9 %  sodium chloride infusion   Intravenous Continuous Lucky Cowboy, Erskine Squibb, MD      . acetaminophen (TYLENOL) tablet 650 mg  650 mg Oral Q6H PRN Dessa Phi, DO      . azithromycin (ZITHROMAX) 500 mg in sodium chloride 0.9 % 250 mL IVPB  500 mg Intravenous Q24H Para Skeans, MD   Stopped at 07/21/20 2346  . cefTRIAXone (ROCEPHIN) 1 g in sodium chloride 0.9 % 100 mL IVPB  1 g Intravenous Q24H Para Skeans, MD   Stopped at 07/21/20 2235  . Chlorhexidine Gluconate Cloth 2 % PADS 6 each   6 each Topical Daily Florina Ou V, MD      . dutasteride (AVODART) capsule 0.5 mg  0.5 mg Oral Daily Dessa Phi, DO      . heparin ADULT infusion 100 units/mL (25000 units/241mL sodium chloride 0.45%)  1,400 Units/hr Intravenous Continuous Para Skeans, MD 14 mL/hr at 07/22/20 0407 1,400 Units/hr at 07/22/20 0407  . ipratropium-albuterol (DUONEB) 0.5-2.5 (3) MG/3ML nebulizer solution 3 mL  3 mL Nebulization Q6H Para Skeans, MD   3 mL at 07/21/20 1628  . ipratropium-albuterol (DUONEB) 0.5-2.5 (3) MG/3ML nebulizer solution 3 mL  3 mL Nebulization Q6H PRN Para Skeans, MD      . megestrol (MEGACE) 40 MG/ML suspension 200 mg  200 mg Oral Daily Dessa Phi, DO      . midodrine (PROAMATINE) tablet 10 mg  10 mg Oral QID Dessa Phi, DO      . oxyCODONE (Oxy IR/ROXICODONE) immediate release tablet 15 mg  15 mg Oral Q4H PRN Dessa Phi, DO   15 mg at 07/22/20 0837  . pantoprazole (PROTONIX) EC tablet 20 mg  20 mg Oral BID Dessa Phi, DO      . rosuvastatin (CRESTOR) tablet 10 mg  10 mg Oral Daily Dessa Phi, DO      . sodium chloride flush (NS) 0.9 % injection 10-40 mL  10-40 mL Intracatheter Q12H  Para Skeans, MD      . sodium chloride flush (NS) 0.9 % injection 10-40 mL  10-40 mL Intracatheter PRN Florina Ou V, MD      . sodium chloride flush (NS) 0.9 % injection 3 mL  3 mL Intravenous Q12H Para Skeans, MD   3 mL at 07/21/20 2220  . sodium chloride flush (NS) 0.9 % injection 3 mL  3 mL Intravenous PRN Para Skeans, MD       Current Outpatient Medications  Medication Sig Dispense Refill  . acetaminophen (TYLENOL) 325 MG tablet Take 2 tablets (650 mg total) by mouth 3 (three) times daily. (Patient taking differently: Take 650 mg by mouth every 6 (six) hours as needed for moderate pain. ) 180 tablet 0  . cholecalciferol (VITAMIN D3) 25 MCG (1000 UNIT) tablet Take 1,000 Units by mouth daily.    . diclofenac Sodium (VOLTAREN) 1 % GEL Apply 1 application topically 4 (four) times  daily as needed (pain).    Marland Kitchen dutasteride (AVODART) 0.5 MG capsule Take 0.5 mg by mouth daily.    . feeding supplement (ENSURE ENLIVE / ENSURE PLUS) LIQD Take 237 mLs by mouth 2 (two) times daily between meals. 42595 mL 0  . folic acid (FOLVITE) 1 MG tablet Take 1 tablet (1 mg total) by mouth daily. 30 tablet 0  . lactulose (CHRONULAC) 10 GM/15ML solution Take 45 mLs (30 g total) by mouth 2 (two) times daily. 1992 mL 0  . lidocaine (LIDODERM) 5 % Place 1 patch onto the skin daily. Remove & Discard patch within 12 hours or as directed by MD 30 patch 0  . Lidocaine (SALONPAS PAIN RELIEVING) 4 % PTCH Apply 1 patch topically every morning. 30 patch 0  . loratadine (CLARITIN) 10 MG tablet Take 10 mg by mouth daily.    . megestrol (MEGACE) 40 MG/ML suspension Take 5 mLs (200 mg total) by mouth daily. 150 mL 1  . midodrine (PROAMATINE) 10 MG tablet Take 1 tablet (10 mg total) by mouth 4 (four) times daily. 120 tablet 0  . montelukast (SINGULAIR) 10 MG tablet TAKE 1 TABLET BY MOUTH EVERY DAY (Patient taking differently: Take 10 mg by mouth daily. ) 90 tablet 3  . ondansetron (ZOFRAN) 8 MG tablet Take 1 tablet (8 mg total) by mouth 2 (two) times daily as needed for refractory nausea / vomiting. Start on day 3 after carboplatin chemo. 30 tablet 1  . oxyCODONE (ROXICODONE) 15 MG immediate release tablet Take 1 tablet (15 mg total) by mouth every 4 (four) hours as needed for pain. 120 tablet 0  . pantoprazole (PROTONIX) 20 MG tablet TAKE ONE TABLET BY MOUTH TWICE A DAY 60 tablet 5  . polyethylene glycol (MIRALAX / GLYCOLAX) 17 g packet Take 17 g by mouth daily as needed for moderate constipation. 30 each 0  . potassium chloride (K-DUR,KLOR-CON) 10 MEQ tablet Take 1 tablet (10 mEq total) by mouth 2 (two) times daily. 180 tablet 3  . prochlorperazine (COMPAZINE) 10 MG tablet Take 1 tablet (10 mg total) by mouth every 6 (six) hours as needed (Nausea or vomiting). 30 tablet 1  . rosuvastatin (CRESTOR) 10 MG tablet  Take 1 tablet (10 mg total) by mouth daily. 90 tablet 3  . vitamin B-12 1000 MCG tablet Take 1 tablet (1,000 mcg total) by mouth daily. 180 tablet 1   Facility-Administered Medications Ordered in Other Encounters  Medication Dose Route Frequency Provider Last Rate Last Admin  . sodium chloride  flush (NS) 0.9 % injection 10 mL  10 mL Intracatheter PRN Lequita Asal, MD   10 mL at 07/06/20 7564    Past Medical History:  Diagnosis Date  . Anemia   . Back pain   . BPH (benign prostatic hyperplasia)   . Cirrhosis ([place])   . COPD (chronic obstructive pulmonary disease) ([place])   . ED (erectile dysfunction)   . GERD (gastroesophageal reflux disease)   . Hyperlipidemia   . Hypertension     Past Surgical History:  Procedure Laterality Date  . COLONOSCOPY Left 01/03/2018   Procedure: COLONOSCOPY;  Surgeon: Virgel Manifold, MD;  Location[facility] ENDOSCOPY;  Service: Endoscopy;  Laterality: Left;  . COLONOSCOPY WITH PROPOFOL N/A 04/05/2018   Procedure: COLONOSCOPY WITH PROPOFOL;  Surgeon: Virgel Manifold, MD;  Location: [facility] ENDOSCOPY;  Service: Endoscopy;  Laterality: N/A;  . CYSTOSCOPY WITH STENT PLACEMENT Right 09/26/2019   Procedure: CYSTOSCOPY WITH STENT PLACEMENT;  Surgeon: Royston Cowper, MD;  Location: [facility] ORS;  Service: Urology;  Laterality: Right;  . ENTEROSCOPY  01/03/2018   Procedure: ENTEROSCOPY;  Surgeon: Virgel Manifold, MD;  Location: [facility] ENDOSCOPY;  Service: Endoscopy;;  . ESOPHAGOGASTRODUODENOSCOPY N/A 01/02/2018   Procedure: ESOPHAGOGASTRODUODENOSCOPY (EGD);  Surgeon: Virgel Manifold, MD;  Location: [facility] ENDOSCOPY;  Service: Endoscopy;  Laterality: N/A;  . ESOPHAGOGASTRODUODENOSCOPY (EGD) WITH PROPOFOL N/A 04/05/2018   Procedure: ESOPHAGOGASTRODUODENOSCOPY (EGD) WITH PROPOFOL;  Surgeon: Virgel Manifold, MD;  Location: [facility] ENDOSCOPY;  Service: Endoscopy;  Laterality: N/A;  . exploration and neurolysis of right peroneal nerve at the knee Right  08/27/2018  . GREEN LIGHT LASER TURP (TRANSURETHRAL RESECTION OF PROSTATE N/A 09/26/2019   Procedure: GREEN LIGHT LASER TURP (TRANSURETHRAL RESECTION OF PROSTATE;  Surgeon: Royston Cowper, MD;  Location: [facility] ORS;  Service: Urology;  Laterality: N/A;  . HERNIA REPAIR     1980's  . PORTACATH PLACEMENT Left 06/29/2020   Procedure: INSERTION PORT-A-CATH;  Surgeon: [name], MD;  Location: [facility] ORS;  Service: Thoracic;  Laterality: Left;  Marland Kitchen VIDEO ASSISTED THORACOSCOPY (VATS)/THOROCOTOMY Left 06/29/2020   Procedure: VIDEO ASSISTED THORACOSCOPY;  Surgeon: [name], MD;  Location: [facility] ORS;  Service: Thoracic;  Laterality: Left;     Social History   Tobacco Use  . Smoking status: Former Smoker    Packs/day: 1.00    Years: 50.00    Pack years: 50.00    Types: Cigarettes    Quit date: 10/20/2013    Years since quitting: 6.7  . Smokeless tobacco: Never Used  Vaping Use  . Vaping Use: Never used  Substance Use Topics  . Alcohol use: Not Currently    Comment: 3-4 gin and sodas a day  . Drug use: No     Family History  Problem Relation Age of Onset  . Alzheimer's disease Mother   . Sarcoidosis Sister   . Anxiety disorder Brother   . Kidney disease Neg Hx   . Prostate cancer Neg Hx     Allergies  Allergen Reactions  . Indomethacin Swelling    ankle swelling  . Tamsulosin Hcl Hives  . Sulfa Antibiotics Rash     REVIEW OF SYSTEMS (Negative unless checked)  Constitutional: [] Weight loss  [] Fever  [] Chills Cardiac: [x] Chest pain   [] Chest pressure   [] Palpitations   [x] Shortness of breath when laying flat   [x] Shortness of breath at rest   [x] Shortness of breath with exertion. Vascular:  [] Pain in legs with walking   [] Pain in legs at rest   []   Pain in legs when laying flat   [] Claudication   [] Pain in feet when walking  [] Pain in feet at rest  [] Pain in feet when laying flat   [x] History of DVT   [x] Phlebitis   [] Swelling in legs   [] Varicose veins   [] Non-healing  ulcers Pulmonary:   [] Uses home oxygen   [] Productive cough   [] Hemoptysis   [] Wheeze  [x] COPD   [] Asthma Neurologic:  [] Dizziness  [] Blackouts   [] Seizures   [] History of stroke   [] History of TIA  [] Aphasia   [] Temporary blindness   [] Dysphagia   [] Weakness or numbness in arms   [] Weakness or numbness in legs Musculoskeletal:  [x] Arthritis   [] Joint swelling   [] Joint pain   [x] Low back pain Hematologic:  [] Easy bruising  [] Easy bleeding   [] Hypercoagulable state   [x] Anemic  [] Hepatitis Gastrointestinal:  [] Blood in stool   [] Vomiting blood  [x] Gastroesophageal reflux/heartburn   [] Difficulty swallowing. Genitourinary:  [] Chronic kidney disease   [] Difficult urination  [] Frequent urination  [] Burning with urination   [] Blood in urine Skin:  [] Rashes   [] Ulcers   [] Wounds Psychological:  [] History of anxiety   []  History of major depression.  Physical Examination  Vitals:   07/22/20 0600 07/22/20 0630 07/22/20 0700 07/22/20 0715  BP: 95/64 102/72 98/65 106/67  Pulse: 94 96 97 (!) 104  Resp: (!) 29 18 19  (!) 21  Temp:    98 F (36.7 C)  TempSrc:    Oral  SpO2: 96% 99% 98% 97%  Weight:      Height:       Body mass index is 24.6 kg/m. Gen:  WD/WN, NAD Head: [place]/AT, No temporalis wasting. Ear/Nose/Throat: Hearing grossly intact, nares w/o erythema or drainage, oropharynx w/o Erythema/Exudate Eyes: Sclera non-icteric, conjunctiva clear Neck: Trachea midline.  No JVD.  Pulmonary:  Good air movement, respirations are labored even on supplemental oxygen Cardiac: tachycardic, but regular Vascular:  Vessel Right Left  Radial Palpable Palpable   Musculoskeletal: M/S 5/5 throughout.  Extremities without ischemic changes.  No deformity or atrophy. No edema. Neurologic: Sensation grossly intact in extremities.  Symmetrical.  Speech is fluent. Motor exam as listed above. Psychiatric: Judgment intact, Mood & affect appropriate for pt's clinical situation. Dermatologic: No rashes or ulcers  noted.  No cellulitis or open wounds.       CBC Lab Results  Component Value Date   WBC 19.6 (H) 07/22/2020   HGB 9.1 (L) 07/22/2020   HCT 27.5 (L) 07/22/2020   MCV 88.7 07/22/2020   PLT 129 (L) 07/22/2020    BMET    Component Value Date/Time   NA 135 07/21/2020 1333   NA 138 06/17/2020 1316   K 3.8 07/21/2020 1333   CL 101 07/21/2020 1333   CO2 22 07/21/2020 1333   GLUCOSE 125 (H) 07/21/2020 1333   BUN 17 07/21/2020 1333   BUN 51 (H) 06/17/2020 1316   CREATININE 0.90 07/21/2020 1333   CALCIUM 8.9 07/21/2020 1333   GFRNONAA >60 07/21/2020 1333   GFRAA 59 (L) 06/23/2020 0507   Estimated Creatinine Clearance: 76.7 mL/min (by C-G formula based on SCr of 0.9 mg/dL).  COAG Lab Results  Component Value Date   INR 1.1 07/22/2020   INR 1.0 06/28/2020   INR 1.2 06/22/2020    Radiology DG Chest 2 View  Result Date: 07/21/2020 CLINICAL DATA:  Pleural effusion, PleurX catheter in place. EXAM: CHEST - 2 VIEW COMPARISON:  July 13, 2020. FINDINGS: Similar appearance of a small  partially loculated left pleural effusion with overlying airspace opacities. Similar more subtle opacities in the left upper lobe. Similar position of a left PleurX catheter. The right lung is clear. No visible pneumothorax. Similar cardiomediastinal silhouette, partially obscured. Left subclavian approach Port-A-Cath in similar position. No acute osseous abnormality. IMPRESSION: 1. Similar appearance of a small partially loculated left pleural effusion with overlying airspace opacities. Similar position of a left PleurX catheter. 2. Similar more subtle opacities in the left upper lobe. Electronically Signed   By: Margaretha Sheffield MD   On: 07/21/2020 14:26   DG Chest 2 View  Result Date: 07/21/2020 CLINICAL DATA:  Shortness of breath. EXAM: CHEST - 2 VIEW COMPARISON:  July 20, 2020. FINDINGS: Similar appearance of a small partially loculated left pleural effusion with overlying airspace opacities.  Similar more subtle opacities in the left upper lobe. Similar position of a left PleurX catheter. The right lung is clear. No visible pneumothorax. Similar cardiomediastinal silhouette, partially obscured. Left subclavian approach Port-A-Cath in similar position. No acute osseous abnormality. IMPRESSION: 1. Similar appearance of a small partially loculated left pleural effusion with overlying airspace opacities. Similar position of a left PleurX catheter. 2. Similar more subtle opacities in the left upper lobe. Electronically Signed   By: Margaretha Sheffield MD   On: 07/21/2020 14:25   DG Chest 2 View  Result Date: 07/13/2020 CLINICAL DATA:  Pleural effusion EXAM: CHEST - 2 VIEW COMPARISON:  June 29, 2020 FINDINGS: PleurX catheter again noted on the left with tip directed posteriorly. There is partially loculated left pleural effusion. There is airspace consolidation throughout mid and lower lung regions on the left. The right lung is clear. Heart is slightly enlarged with pulmonary vascularity normal. No adenopathy. No pneumothorax. There is bullous disease in the extreme right apex. No bone lesions. Port-A-Cath tip in superior vena cava. IMPRESSION: PleurX catheter present. Left pleural effusion appears similar on the left, partially loculated. Airspace opacity throughout the mid and lower lung regions. Slightly less opacity in the left upper lobe compared to most recent study. Right lung essentially clear. Stable cardiac silhouette. Electronically Signed   By: Lowella Grip III M.D.   On: 07/13/2020 10:10   DG Chest 2 View  Result Date: 06/22/2020 CLINICAL DATA:  Shortness of breath.  Hypotensive. EXAM: CHEST - 2 VIEW COMPARISON:  06/17/2020 FINDINGS: Complete opacification of the left lung with rightward shift of the mediastinum. Linear opacities in the right lung base. No visible pneumothorax. Cardiomediastinal silhouette is largely obscured. No acute osseous abnormality. IMPRESSION: 1. Complete  opacification of the left lung, likely related to a large pleural effusion (increased from prior) and possibly overlying consolidation. There is rightward mediastinal shift. Correlate for evidence of tension physiology. 2. Linear opacities in the right lung base, which could represent atelectasis, aspiration, and/or pneumonia. Electronically Signed   By: Margaretha Sheffield MD   On: 06/22/2020 11:45   CT ANGIO CHEST PE W OR WO CONTRAST  Result Date: 07/22/2020 CLINICAL DATA:  Shortness of breath, lung cancer EXAM: CT ANGIOGRAPHY CHEST WITH CONTRAST TECHNIQUE: Multidetector CT imaging of the chest was performed using the standard protocol during bolus administration of intravenous contrast. Multiplanar CT image reconstructions and MIPs were obtained to evaluate the vascular anatomy. CONTRAST:  34mL OMNIPAQUE IOHEXOL 350 MG/ML SOLN COMPARISON:  06/05/2020 FINDINGS: Cardiovascular: Filling defects seen within multiple right pulmonary arteries in all lobes of the right lung compatible with pulmonary emboli. No definite pulmonary emboli on the left. RV: LV ratio is mildly  elevated at 1.0. Heart is mildly enlarged. 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 Coronary artery and aortic calcifications. Mediastinum/Nodes: Enlarging prevascular lymph nodes since prior study, measuring up to 10 mm currently, not measurable on prior study. Subcarinal adenopathy has increased, measuring 2 cm in short axis diameter compared to 1.5 cm previously. Abnormal left hilar lymph nodes, increasing since prior study. Lungs/Pleura: Small loculated pleural effusion again noted, similar prior study with PleurX catheter in place. Pleural soft tissue masses are noted throughout the left pleural space. Moderate emphysema. No confluent opacity or effusion on the right. Masslike area of airspace disease/consolidation sound in the left lower lobe, encases the left lower lobe pulmonary arteries and measures 6.6 x 5.6 cm. Upper Abdomen:  Bulky mass anteriorly in the left cardiophrenic region and extending between to left ribs has enlarged, measuring 4.9 x 3.9 cm compared to 4.4 x 3.2 cm previously. Small hypodensities throughout the liver compatible with cysts. Gastrohepatic ligament adenopathy worsened with short axis diameter measuring up 2 1.5 cm compared with 1.2 cm previously. Musculoskeletal: Left Port-A-Cath in place. No acute bony abnormality. Review of the MIP images confirms the above findings. IMPRESSION: Pulmonary emboli seen within all lobes of the right lung. Slightly elevated RV: LV ratio compatible with right heart strain. Worsening masslike consolidation in the left lower lobe. Pleural nodularity noted throughout the left pleural space with small loculated left pleural effusion. Worsening mediastinal and upper abdominal adenopathy. Worsening left anterior cardiophrenic/abdominal wall mass which extends between 2 of the left ribs. Coronary artery disease. Aortic Atherosclerosis (ICD10-I70.0) and Emphysema (ICD10-J43.9). These results were called by telephone at the time of interpretation on 07/22/2020 at 1:05 am to provider Rufina Falco , who verbally acknowledged these results. Electronically Signed   By: Rolm Baptise M.D.   On: 07/22/2020 01:06   MR BRAIN W WO CONTRAST  Result Date: 06/29/2020 CLINICAL DATA:  Initial evaluation for small cell lung cancer, staging. EXAM: MRI HEAD WITHOUT AND WITH CONTRAST TECHNIQUE: Multiplanar, multiecho pulse sequences of the brain and surrounding structures were obtained without and with intravenous contrast. CONTRAST:  7.56mL GADAVIST GADOBUTROL 1 MMOL/ML IV SOLN COMPARISON:  Prior MRI from 03/04/2013. FINDINGS: Brain: Examination degraded by motion artifact. Age-related cerebral atrophy with mild-to-moderate chronic microvascular ischemic disease. No acute intracranial infarct. Gray-white matter differentiation maintained. No encephalomalacia to suggest chronic cortical infarction. No foci  of susceptibility artifact to suggest acute or chronic intracranial hemorrhage. No mass lesion, midline shift or mass effect. No hydrocephalus or extra-axial fluid collection. Pituitary gland suprasellar region normal. Midline structures intact. No abnormal enhancement or findings to suggest intracranial metastatic disease. Vascular: Major intracranial vascular flow voids are maintained. Skull and upper cervical spine: Craniocervical junction within normal limits. No focal marrow replacing lesion. Multilevel degenerative spondylolysis noted within the upper cervical spine. No scalp soft tissue abnormality. Sinuses/Orbits: Globes and orbital soft tissues demonstrate no acute finding. Paranasal sinuses are largely clear. No mastoid effusion. Inner ear structures grossly normal. Other: None. IMPRESSION: 1. No evidence for intracranial metastatic disease. 2. No other acute intracranial abnormality. 3. Mild to moderate chronic microvascular ischemic disease. Electronically Signed   By: Jeannine Boga M.D.   On: 06/29/2020 03:01   MR THORACIC SPINE W WO CONTRAST  Result Date: 06/29/2020 CLINICAL DATA:  Initial evaluation for non-small cell lung cancer, pretreatment staging. Abnormality T11-12. EXAM: MRI THORACIC WITHOUT AND WITH CONTRAST TECHNIQUE: Multiplanar and multiecho pulse sequences of the thoracic spine were obtained without and with intravenous contrast. CONTRAST:  7.48mL  GADAVIST GADOBUTROL 1 MMOL/ML IV SOLN COMPARISON:  Prior PET-CT from 06/12/2020. FINDINGS: MRI THORACIC SPINE FINDINGS Alignment: Vertebral bodies normally aligned with preservation of the normal thoracic kyphosis. No listhesis. Vertebrae: Signal intensity within the visualized bone marrow is diffusely heterogeneous. A left paraspinous soft tissue mass is seen along the left anterolateral aspect of the vertebral column extending from approximately T9-10 through T12-L1 (series 20, image 18). Evidence for invasion/involvement of the  adjacent T10 and T11 vertebral bodies (series 21, image 14). Tumor partially extends into the left neural foramen at T11-12 (series 40, image 34). No other significant extension into the spinal canal or epidural tumor. A separate 13 mm metastasis seen involving the central/right aspect of the T10 vertebral body (series 39, image 8). Probable additional 13 mm metastatic lesion seen at T1 (series 20, image 9). 1 cm lesion seen within the right pedicle of T6 (series 21, image 6). No associated pathologic fracture. Cord:  Normal signal and morphology.  No significant epidural tumor. Paraspinal and other soft tissues: Paraspinous tumor as above. Paraspinous soft tissues demonstrate no other acute finding. Irregular and partially loculated left pleural effusion with scattered fluid-fluid levels partially visualized at the left lung base. Consolidative changes noted throughout the posterior left lung superiorly. Disc levels: Normal expected multilevel degenerative disc desiccation seen within the thoracic spine. Mild disc bulging noted at the T7-8 level without significant spinal stenosis. Multilevel facet hypertrophy throughout the lower thoracic spine. No significant stenosis or foraminal encroachment. IMPRESSION: 1. 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 Tumor partially extends into the left neural foramen at T11-12. No other significant extension into the spinal canal or epidural tumor. 2. Additional metastatic lesions involving the T1 and T10 vertebral bodies as well as the right pedicle of T6. No associated pathologic fracture. 3. Heterogeneous and partially loculated left pleural effusion with consolidation signal intensity throughout the visualized left lower lung. Electronically Signed   By: Jeannine Boga M.D.   On: 06/29/2020 03:18   US Venous Img Lower Bilateral (DVT)  Result Date:  07/21/2020 CLINICAL DATA:  Lower extremity edema EXAM: BILATERAL LOWER EXTREMITY VENOUS DOPPLER ULTRASOUND TECHNIQUE: Gray-scale sonography with graded compression, as well as color Doppler and duplex ultrasound were performed to evaluate the lower extremity deep venous systems from the level of the common femoral vein and including the common femoral, femoral, profunda femoral, popliteal and calf veins including the posterior tibial, peroneal and gastrocnemius veins when visible. The superficial great saphenous vein was also interrogated. Spectral Doppler was utilized to evaluate flow at rest and with distal augmentation maneuvers in the common femoral, femoral and popliteal veins. COMPARISON:  None. FINDINGS: RIGHT LOWER EXTREMITY Common Femoral Vein: No evidence of thrombus. Normal compressibility, respiratory phasicity and response to augmentation. Saphenofemoral Junction: No evidence of thrombus. Normal compressibility and flow on color Doppler imaging. Profunda Femoral Vein: No evidence of thrombus. Normal compressibility and flow on color Doppler imaging. Femoral Vein: No evidence of thrombus. Normal compressibility, respiratory phasicity and response to augmentation. Popliteal Vein: Thrombus is noted with decreased compressibility. Calf Veins: Thrombus is noted within the posterior tibial and peroneal veins with decreased compressibility. Superficial Great Saphenous Vein: No evidence of thrombus. Normal compressibility. Venous Reflux:  None. Other Findings:  None. LEFT LOWER EXTREMITY Common Femoral Vein: No evidence of thrombus. Normal compressibility, respiratory phasicity and response to augmentation. Saphenofemoral Junction: No evidence of thrombus. Normal compressibility and flow on color Doppler imaging.  Profunda Femoral Vein: No evidence of thrombus. Normal compressibility and flow on color Doppler imaging. Femoral Vein: No evidence of thrombus. Normal compressibility, respiratory phasicity and  response to augmentation. Popliteal Vein: Thrombus is noted with decreased compressibility. Calf Veins: Peroneal vein demonstrates thrombus with decreased compressibility. Superficial Great Saphenous Vein: No evidence of thrombus. Normal compressibility. Venous Reflux:  None. Other Findings:  None. IMPRESSION: Bilateral deep venous thrombosis involving the popliteal and calf veins. Electronically Signed   By: Inez Catalina M.D.   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 ANESTHESIA/SEDATION: Moderate (conscious) sedation was employed during this procedure. A total of Versed 0.5 mg and Fentanyl 25 mcg was administered intravenously. Moderate Sedation Time: 10 minutes. The patient's level of consciousness and vital signs were monitored continuously by radiology nursing throughout the procedure under my direct supervision. FLUOROSCOPY TIME:  CT COMPLICATIONS: None PROCEDURE: The procedure, risks, benefits, and alternatives were explained to the patient and the patient's family. Specific risks that were addressed included bleeding, infection, pneumothorax, need for further procedure including chest tube placement, chance of delayed pneumothorax or hemorrhage, hemoptysis, nondiagnostic sample, cardiopulmonary collapse, death. Questions regarding the procedure were encouraged and answered. The patient understands and consents to the procedure. Patient was positioned in the supine position on the CT gantry table and a scout CT of the chest was performed for planning purposes. Once angle of approach was determined, the skin and subcutaneous tissues this scan was prepped and draped in the usual sterile fashion, and a sterile drape was applied covering the operative field. A sterile gown and sterile gloves were used for the procedure. Local  anesthesia was provided with 1% Lidocaine. The skin and subcutaneous tissues were infiltrated 1% lidocaine for local anesthesia, and a small stab incision was made with an 11 blade scalpel. Using CT guidance, a 17 gauge trocar needle was advanced into the left lower anterior chest walltarget. After confirmation of the tip, separate 18 gauge core biopsies were performed. These were placed into solution for transportation to the lab. Final CT was acquired Patient tolerated the procedure well and remained hemodynamically stable throughout. No complications were encountered and no significant blood loss was encounter IMPRESSION: Status post CT-guided biopsy of left chest wall mass, FDG avid on prior PET. Signed, Dulcy Fanny. Dellia Nims, RPVI [facility] Radiology Electronically Signed   By: Corrie Mckusick D.O.   On: 06/24/2020 12:20   DG Chest Port 1 View  Result Date: 06/29/2020 CLINICAL DATA:  Status post Port-A-Cath placement. EXAM: PORTABLE CHEST 1 VIEW COMPARISON:  June 25, 2020. FINDINGS: Stable cardiomegaly. Interval placement of left subclavian Port-A-Cath with distal tip in expected position of cavoatrial junction. No pneumothorax pleural effusion is noted. Right lung is clear. Left pleural effusion is significantly smaller status post pleural drainage catheter placement. Diffuse left lung opacity is noted concerning for atelectasis or possibly inflammation. Bony thorax is unremarkable. IMPRESSION: Interval placement of left subclavian Port-A-Cath with distal tip in expected position of cavoatrial junction. Left pleural effusion is significantly smaller status post pleural drainage catheter placement. Electronically Signed   By: Marijo Conception M.D.   On: 06/29/2020 13:30   DG Chest Port 1 View  Result Date: 06/25/2020 CLINICAL DATA:  Chest tube. EXAM: PORTABLE CHEST 1 VIEW COMPARISON:  06/24/2020. FINDINGS: Left chest tube in stable  position. No  pneumothorax. Prominent skin folds again noted on the right. Persistent left base atelectasis/infiltrate. Small left pleural effusion. No pneumothorax. Cardiomegaly. Degenerative changes scoliosis thoracic spine. IMPRESSION: 1.  Left chest tube in stable position. No pneumothorax. 2. Persistent left base atelectasis/infiltrate. Small left pleural effusion. Similar findings on prior exam. Electronically Signed   By: Pratt   On: 06/25/2020 05:01   DG Chest Port 1 View  Result Date: 06/24/2020 CLINICAL DATA:  Chest tube. EXAM: PORTABLE CHEST 1 VIEW COMPARISON:  June 22, 2020. FINDINGS: Stable cardiomegaly. Stable position of left-sided chest tube. Moderate left pleural effusion is noted which is significantly enlarged compared to prior exam. Atelectasis or infiltrate is noted in the remaining portion of the aerated left lung. Right lung is clear. No pneumothorax is noted. Bony thorax is unremarkable. IMPRESSION: Stable position of left-sided chest tube. Moderate left pleural effusion is noted which is significantly enlarged compared to prior exam. Atelectasis or infiltrate is noted in the remaining portion of the aerated left lung. Electronically Signed   By: Marijo Conception M.D.   On: 06/24/2020 08:43   DG Chest Port 1 View  Result Date: 06/22/2020 CLINICAL DATA:  Postop check.  Chest tube placement. EXAM: PORTABLE CHEST 1 VIEW COMPARISON:  Radiograph earlier this day.  Most recent CT 06/06/2019 FINDINGS: Pigtail catheter projects over the left mid lung. No visualized pneumothorax. Improved hazy opacity at the left lung base consistent with decreasing pleural effusion and associated airspace disease/atelectasis. Small volume of pleural fluid and or thickening persists. Slight increase in right infrahilar atelectasis. Stable heart size and mediastinal contours. IMPRESSION: 1. Left pigtail catheter in place. No visualized pneumothorax. 2. Improved hazy opacity at the left lung base consistent with  decreasing pleural effusion and associated airspace disease/atelectasis. Small volume of pleural fluid and or thickening persists. Electronically Signed   By: Keith Rake M.D.   On: 06/22/2020 15:44   DG Chest Portable 1 View  Result Date: 06/22/2020 CLINICAL DATA:  Chest tube placement EXAM: PORTABLE CHEST 1 VIEW COMPARISON:  June 22, 2020 study obtained earlier in the day FINDINGS: Chest tube present on the left with considerable diminution in size of left pleural effusion. There is a fairly small residual left pleural effusion with patchy atelectasis in the left mid and lower lung regions. Lungs elsewhere are clear. Heart is upper normal in size with pulmonary vascularity normal. No adenopathy. No pneumothorax. No bone lesions. IMPRESSION: Left pleural effusion considerably smaller after chest tube placement. There is a fairly small persistent left pleural effusion with atelectatic change in the left mid and lower lung regions. Lungs otherwise clear. Heart upper normal in size. Electronically Signed   By: Lowella Grip III M.D.   On: 06/22/2020 13:49   DG C-Arm 1-60 Min-No Report  Result Date: 06/29/2020 Fluoroscopy was utilized by the requesting physician.  No radiographic interpretation.      Assessment/Plan 1.  Large right sided pulmonary embolus involving all 3 primary lobar arteries.  There is evidence of right heart strain on the CT scan.  The patient has very poor pulmonary reserve secondary to his lung cancer as well, and is hypoxic requiring significant oxygen and has shortness of breath with minimal activity.  Pulmonary thrombectomy reasonable option at this point. Will plan to do today.  Risks and benefits are discussed and the patient is agreeable to proceed. 2.  Lung cancer.  Produces poor pulmonary reserve.  Also has COPD. Oncology following. 3.  History of GI  bleed. Is currently anemic but no obvious bleeding currently. We will avoid giving TPA for the procedure and just  perform thrombectomy alone. 4.  Hypertension. Stable on outpatient medications and blood pressure control important in reducing the progression of atherosclerotic disease. On appropriate oral medications. 5.  Hyperlipidemia. lipid control important in reducing the progression of atherosclerotic disease. Continue statin therapy    [name], MD  07/22/2020 10:29 AM    This note was created with Dragon medical transcription system.  Any error is purely unintentional

## 2020-07-22 NOTE — Progress Notes (Signed)
ANTICOAGULATION CONSULT NOTE - Initial Consult  Pharmacy Consult for Heparin  Indication: DVT  Allergies  Allergen Reactions  . Indomethacin Swelling    ankle swelling  . Tamsulosin Hcl Hives  . Sulfa Antibiotics Rash    Patient Measurements: Height: 5\' 11"  (180.3 cm) Weight: 80 kg (176 lb 5.9 oz) IBW/kg (Calculated) : 75.3 Heparin Dosing Weight: 80 kg   Vital Signs: Temp: 98.4 F (36.9 C) (11/03 2116) Temp Source: Oral (11/03 2116) BP: 89/55 (11/03 2116) Pulse Rate: 97 (11/03 2116)  Labs: Recent Labs    07/21/20 1333 07/21/20 1818 07/22/20 0400 07/22/20 2119  HGB 10.2*  --  9.1*  --   HCT 30.6*  --  27.5*  --   PLT 133*  --  129*  --   APTT  --   --  31  --   LABPROT  --   --  13.9  --   INR  --   --  1.1  --   HEPARINUNFRC  --   --   --  0.19*  CREATININE 0.90  --   --   --   TROPONINIHS 12 12  --   --     Estimated Creatinine Clearance: 76.7 mL/min (by C-G formula based on SCr of 0.9 mg/dL).   Medical History: Past Medical History:  Diagnosis Date  . Anemia   . Back pain   . BPH (benign prostatic hyperplasia)   . Cirrhosis ([place])   . COPD (chronic obstructive pulmonary disease) ([place])   . ED (erectile dysfunction)   . GERD (gastroesophageal reflux disease)   . Hyperlipidemia   . Hypertension     Medications:  Medications Prior to Admission  Medication Sig Dispense Refill Last Dose  . acetaminophen (TYLENOL) 325 MG tablet Take 2 tablets (650 mg total) by mouth 3 (three) times daily. (Patient taking differently: Take 650 mg by mouth every 6 (six) hours as needed for moderate pain. ) 180 tablet 0 Past Month at prn  . cholecalciferol (VITAMIN D3) 25 MCG (1000 UNIT) tablet Take 1,000 Units by mouth daily.   Past Week at Unknown time  . dutasteride (AVODART) 0.5 MG capsule Take 0.5 mg by mouth daily.   Past Week at Unknown time  . feeding supplement (ENSURE ENLIVE / ENSURE PLUS) LIQD Take 237 mLs by mouth 2 (two) times daily between meals. 14220 mL 0 Past  Week at Unknown time  . folic acid (FOLVITE) 1 MG tablet Take 1 tablet (1 mg total) by mouth daily. 30 tablet 0 Past Week at Unknown time  . lactulose (CHRONULAC) 10 GM/15ML solution Take 45 mLs (30 g total) by mouth 2 (two) times daily. 1992 mL 0 Past Week at Unknown time  . lidocaine (LIDODERM) 5 % Place 1 patch onto the skin daily. Remove & Discard patch within 12 hours or as directed by MD 30 patch 0 Past Month at Unknown time  . Lidocaine (SALONPAS PAIN RELIEVING) 4 % PTCH Apply 1 patch topically every morning. 30 patch 0 Past Month at Unknown time  . loratadine (CLARITIN) 10 MG tablet Take 10 mg by mouth daily.   Past Week at Unknown time  . megestrol (MEGACE) 40 MG/ML suspension Take 5 mLs (200 mg total) by mouth daily. 150 mL 1 Past Week at Unknown time  . midodrine (PROAMATINE) 10 MG tablet Take 1 tablet (10 mg total) by mouth 4 (four) times daily. 120 tablet 0 07/21/2020 at Unknown time  . montelukast (SINGULAIR) 10 MG tablet  TAKE 1 TABLET BY MOUTH EVERY DAY (Patient taking differently: Take 10 mg by mouth daily. ) 90 tablet 3 Past Week at Unknown time  . ondansetron (ZOFRAN) 8 MG tablet Take 1 tablet (8 mg total) by mouth 2 (two) times daily as needed for refractory nausea / vomiting. Start on day 3 after carboplatin chemo. 30 tablet 1 Past Month at Unknown time  . oxyCODONE (ROXICODONE) 15 MG immediate release tablet Take 1 tablet (15 mg total) by mouth every 4 (four) hours as needed for pain. 120 tablet 0 07/21/2020 at 0930  . pantoprazole (PROTONIX) 20 MG tablet TAKE ONE TABLET BY MOUTH TWICE A DAY 60 tablet 5 Past Week at Unknown time  . polyethylene glycol (MIRALAX / GLYCOLAX) 17 g packet Take 17 g by mouth daily as needed for moderate constipation. 30 each 0 Past Week at prn  . potassium chloride (K-DUR,KLOR-CON) 10 MEQ tablet Take 1 tablet (10 mEq total) by mouth 2 (two) times daily. 180 tablet 3 Past Week at Unknown time  . prochlorperazine (COMPAZINE) 10 MG tablet Take 1 tablet (10 mg  total) by mouth every 6 (six) hours as needed (Nausea or vomiting). 30 tablet 1 Past Month at prn  . rosuvastatin (CRESTOR) 10 MG tablet Take 1 tablet (10 mg total) by mouth daily. 90 tablet 3 Past Month at Unknown time  . vitamin B-12 1000 MCG tablet Take 1 tablet (1,000 mcg total) by mouth daily. 180 tablet 1 Past Month at Unknown time  . diclofenac Sodium (VOLTAREN) 1 % GEL Apply 1 application topically 4 (four) times daily as needed (pain). (Patient not taking: Reported on 07/22/2020)   Not Taking at prn    Assessment: Pharmacy consulted to dose heparin in this 74 year old male admitted with DVT.   Pt was originally ordered therapeutic lovenox but has been switched to UFH in anticipation of vascular surgery.  CrCl = 76.7 ml/min   Pt initially ordered Give 5000 units bolus x 1 Heparin infusion at 1400  units/hr First anti-Xa level ordered for 1200 today but not drawn.  Pt transferred to Cath Lab.  Heparin drip stopped at 1313 and restarted at 1442 following procedure.  Goal of Therapy:  Heparin level 0.3-0.7 units/ml Monitor platelets by anticoagulation protocol: Yes   Plan: Ordered new HL for 2100.Give 5000 units bolus x 1 Start heparin infusion at 1400  units/hr Check anti-Xa level every 8 hours until therapeutic x 2, then daily while on heparin.  Continue to monitor H&H and platelets.  CBC ordered with AM labs.  11/03:  HL @ 2119 = 0.19 Will order Heparin 2400 units IV X 1 bolus and increase drip rate to 1700 units/hr.  Will recheck HL 8 hrs after rate change.   Dwayne Thompson 07/22/2020 11:24 PM

## 2020-07-22 NOTE — Consult Note (Signed)
[facility]  Date of admission:  07/21/2020  Inpatient day:  07/22/2020  Consulting physician: Dr Florina Ou.  Reason for Consultation:  Extensive small cell lung cancer.  Chief Complaint: Dwayne Thompson is a 74 y.o. male with extensive stage small cell lung cancer admitted acute on chronic shortness of breath secondary to pulmonary emboli with associated bilateral lower extremity DVTs  HPI:  The patient was diagnosed with extensive stage small cell lung cancer on 06/24/2020 s/p left chest wall/pleural biopsy. He presented with a left hemothorax.  He underwent limited decortication and insertion of a Pleurx catheter on 06/29/2020.  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 There was accentuated activity along the consolidative and atelectatic left lower lobe probably from tumor within the lung but potentially from tumor along the visceral pleura. There was hypermetabolic thoracic adenopathy along with hypermetabolic upper abdominal adenopathy and hypermetabolic skeletal metastatic lesions (right iliac bone SUV 11.9 and T1). There was possible early tumor extension into the left T11-12 neuroforamen with some erosion of the adjacent T11 vertebral body.  Head MRI on 06/28/2020 revealed no evidence of metastatic disease.  He received cycle #1 carboplatin, etoposide, and Tecentriq with Neulasta support on 07/06/2020.  He was last seen in the medical oncology clinic on 07/15/2020 for nadir assessment.  He felt "pretty good."  He had very little pain. He had been taking low dose Megace for 4 days; his appetite had improved a little bit.  Pleurx catheter was draining little.  CBC revealeed a hematocrit of   His wife contacted the clinic yesterday with acute shortness of breath.  Oxygen saturations were 82-86% despite 2 liters of oxygen via nasal cannula.  Patient was directed to the ER  via EMS.  CXR was similar.  Chest CT angiogram on 07/22/2020 revealed pulmonary emboli seen within all lobes of the right lung. There was slightly elevated RV: LV ratio compatible with right heart strain.  There was worsening masslike consolidation in the left lower lobe. There was pleural nodularity noted throughout the left pleural space with small loculated left pleural effusion.  There was worsening mediastinal and upper abdominal adenopathy.  There was worsening left anterior cardiophrenic/abdominal wall mass which extended between 2 of the left ribs.  Bilateral lower extremity ultrasound on 07/21/2020 revealed bilateral DVTs involving the popliteal and calf veins.  He was started on heparin. Vascular surgery has been consulted.  He was started on azithromycin and ceftriaxone for possible pneumonia.  Symptomatically, he states that he noticed increased shortness of breath 2 days ago.  Lower extremity swelling had increased.  He notes pain in the left side of his chest (stable) s/p Pleurx catheter with known pleural based disease.  Drainage has been minimal from the Pleurx catheter.  He is breathing better today on 4 liters oxygen via [place].   Past Medical History:  Diagnosis Date  . Anemia   . Back pain   . BPH (benign prostatic hyperplasia)   . Cirrhosis ([place])   . COPD (chronic obstructive pulmonary disease) ([place])   . ED (erectile dysfunction)   . GERD (gastroesophageal reflux disease)   . Hyperlipidemia   . Hypertension     Past Surgical History:  Procedure Laterality Date  . COLONOSCOPY Left 01/03/2018   Procedure: COLONOSCOPY;  Surgeon: Virgel Manifold, MD;  Location: [facility] ENDOSCOPY;  Service: Endoscopy;  Laterality: Left;  . COLONOSCOPY WITH PROPOFOL N/A 04/05/2018  Procedure: COLONOSCOPY WITH PROPOFOL;  Surgeon: Virgel Manifold, MD;  Location: [facility] ENDOSCOPY;  Service: Endoscopy;  Laterality: N/A;  . CYSTOSCOPY WITH STENT PLACEMENT Right 09/26/2019   Procedure:  CYSTOSCOPY WITH STENT PLACEMENT;  Surgeon: Royston Cowper, MD;  Location: [facility] ORS;  Service: Urology;  Laterality: Right;  . ENTEROSCOPY  01/03/2018   Procedure: ENTEROSCOPY;  Surgeon: Virgel Manifold, MD;  Location: [facility] ENDOSCOPY;  Service: Endoscopy;;  . ESOPHAGOGASTRODUODENOSCOPY N/A 01/02/2018   Procedure: ESOPHAGOGASTRODUODENOSCOPY (EGD);  Surgeon: Virgel Manifold, MD;  Location: [facility] ENDOSCOPY;  Service: Endoscopy;  Laterality: N/A;  . ESOPHAGOGASTRODUODENOSCOPY (EGD) WITH PROPOFOL N/A 04/05/2018   Procedure: ESOPHAGOGASTRODUODENOSCOPY (EGD) WITH PROPOFOL;  Surgeon: Virgel Manifold, MD;  Location: [facility] ENDOSCOPY;  Service: Endoscopy;  Laterality: N/A;  . exploration and neurolysis of right peroneal nerve at the knee Right 08/27/2018  . GREEN LIGHT LASER TURP (TRANSURETHRAL RESECTION OF PROSTATE N/A 09/26/2019   Procedure: GREEN LIGHT LASER TURP (TRANSURETHRAL RESECTION OF PROSTATE;  Surgeon: Royston Cowper, MD;  Location: [facility] ORS;  Service: Urology;  Laterality: N/A;  . HERNIA REPAIR     1980's  . PORTACATH PLACEMENT Left 06/29/2020   Procedure: INSERTION PORT-A-CATH;  Surgeon: [name], MD;  Location: [facility] ORS;  Service: Thoracic;  Laterality: Left;  Marland Kitchen VIDEO ASSISTED THORACOSCOPY (VATS)/THOROCOTOMY Left 06/29/2020   Procedure: VIDEO ASSISTED THORACOSCOPY;  Surgeon: [name], MD;  Location: [facility] ORS;  Service: Thoracic;  Laterality: Left;    Family History  Problem Relation Age of Onset  . Alzheimer's disease Mother   . Sarcoidosis Sister   . Anxiety disorder Brother   . Kidney disease Neg Hx   . Prostate cancer Neg Hx     Social History:  reports that he quit smoking about 6 years ago. His smoking use included cigarettes. He has a 50.00 pack-year smoking history. He has never used smokeless tobacco. He reports previous alcohol use. He reports that he does not use drugs.  The patient denies any exposure to radiation or toxins.  The patient lives in [place].   His wife's name is [name]; she has metastatic breast cancer.  He/she is accompanied by accompanied by his wife over the phone.  Allergies:  Allergies  Allergen Reactions  . Indomethacin Swelling    ankle swelling  . Tamsulosin Hcl Hives  . Sulfa Antibiotics Rash    (Not in a hospital admission)  Review of Systems  Constitutional: Positive for malaise/fatigue and weight loss. Negative for chills, diaphoresis and fever.  HENT: Negative for congestion, nosebleeds, sinus pain and sore throat.   Eyes: Negative.  Negative for blurred vision, double vision and photophobia.  Respiratory: Positive for shortness of breath. Negative for cough, hemoptysis, sputum production, wheezing and stridor.   Cardiovascular: Positive for chest pain (left sided) and leg swelling. Negative for palpitations and orthopnea.  Gastrointestinal: Negative for abdominal pain, blood in stool, diarrhea, heartburn, melena, nausea and vomiting.       Appetite has improved.  Genitourinary: Negative.  Negative for dysuria, frequency, hematuria and urgency.  Musculoskeletal: Negative for back pain, falls and joint pain.       Left sided chest pain.  Skin: Negative.  Negative for itching and rash.  Neurological: Positive for weakness (general). Negative for dizziness, tingling, tremors, sensory change, speech change, focal weakness, seizures and headaches.  Endo/Heme/Allergies: Negative for environmental allergies. Does not bruise/bleed easily.  Psychiatric/Behavioral: Negative.  Negative for memory loss. The patient is not nervous/anxious and does not have insomnia.  Feeling overwhelmed.    Vitals:  Blood pressure 95/64, pulse 94, temperature 98 F (36.7 C), temperature source Oral, resp. rate (!) 29, height 5\' 11"  (1.803 m), weight 176 lb 5.9 oz (80 kg), SpO2 96 %.   Physical Exam Vitals and nursing note reviewed.  Constitutional:      General: He is not in acute distress.    Comments: Chronically fatigued  appearing gentleman lying on a stretcher in the ER in no acute distress.  HENT:     Head: Normocephalic.     Comments: Male pattern baldness.  Short gray hair.    Nose:     Comments: Monon in place.    Mouth/Throat:     Mouth: Mucous membranes are moist.  Eyes:     Extraocular Movements: Extraocular movements intact.     Pupils: Pupils are equal, round, and reactive to light.     Comments: Glasses.  Brown eyes.  Cardiovascular:     Rate and Rhythm: Normal rate and regular rhythm.  Pulmonary:     Breath sounds: No wheezing or rales.     Comments: Decreased breath sounds left chest anteriorly. Abdominal:     General: Bowel sounds are normal. There is no distension.     Palpations: There is no mass.     Tenderness: There is no abdominal tenderness. There is no guarding or rebound.  Musculoskeletal:     Cervical back: Normal range of motion and neck supple.     Right lower leg: Edema (below the knee) present.     Left lower leg: Edema (below the knee) present.  Skin:    Coloration: Skin is not jaundiced or pale.     Findings: No bruising, erythema or rash.  Neurological:     General: No focal deficit present.     Mental Status: He is alert and oriented to person, place, and time. Mental status is at baseline.  Psychiatric:        Mood and Affect: Mood normal.        Behavior: Behavior normal.        Thought Content: Thought content normal.        Judgment: Judgment normal.     Comments: Tearful at times.     Results for orders placed or performed during the hospital encounter of 07/21/20 (from the past 48 hour(s))  Basic metabolic panel     Status: Abnormal   Collection Time: 07/21/20  1:33 PM  Result Value Ref Range   Sodium 135 135 - 145 mmol/L   Potassium 3.8 3.5 - 5.1 mmol/L   Chloride 101 98 - 111 mmol/L   CO2 22 22 - 32 mmol/L   Glucose, Bld 125 (H) 70 - 99 mg/dL    Comment: Glucose reference range applies only to samples taken after fasting for at least 8 hours.   BUN  17 8 - 23 mg/dL   Creatinine, Ser 0.90 0.61 - 1.24 mg/dL   Calcium 8.9 8.9 - 10.3 mg/dL   GFR, Estimated >60 >60 mL/min    Comment: (NOTE) Calculated using the CKD-EPI Creatinine Equation (2021)    Anion gap 12 5 - 15    Comment: Performed at [facility], [address] [place], [place] [address]  CBC     Status: Abnormal   Collection Time: 07/21/20  1:33 PM  Result Value Ref Range   WBC 18.0 (H) 4.0 - 10.5 K/uL   RBC 3.47 (L) 4.22 - 5.81 MIL/uL   Hemoglobin  10.2 (L) 13.0 - 17.0 g/dL   HCT 30.6 (L) 39 - 52 %   MCV 88.2 80.0 - 100.0 fL   MCH 29.4 26.0 - 34.0 pg   MCHC 33.3 30.0 - 36.0 g/dL   RDW 14.4 11.5 - 15.5 %   Platelets 133 (L) 150 - 400 K/uL    Comment: Immature Platelet Fraction may be clinically indicated, consider ordering this additional test [id number]    nRBC 0.0 0.0 - 0.2 %    Comment: Performed at [facility], [address], [place], [place] [address]  Troponin I (High Sensitivity)     Status: None   Collection Time: 07/21/20  1:33 PM  Result Value Ref Range   Troponin I (High Sensitivity) 12 <18 ng/L    Comment: (NOTE) Elevated high sensitivity troponin I (hsTnI) values and significant  changes across serial measurements may suggest ACS but many other  chronic and acute conditions are known to elevate hsTnI results.  Refer to the "Links" section for chest pain algorithms and additional  guidance. Performed at [facility], [address] [place], [place] [address]   Respiratory Panel by RT PCR (Flu A&B, Covid) - Nasopharyngeal Swab     Status: None   Collection Time: 07/21/20  4:12 PM   Specimen: Nasopharyngeal Swab  Result Value Ref Range   SARS Coronavirus 2 by RT PCR NEGATIVE NEGATIVE    Comment: (NOTE) SARS-CoV-2 target nucleic acids are NOT DETECTED.  The SARS-CoV-2 RNA is generally detectable in upper respiratoy specimens during the acute phase of infection. The lowest concentration of SARS-CoV-2 viral copies  this assay can detect is 131 copies/mL. A negative result does not preclude SARS-Cov-2 infection and should not be used as the sole basis for treatment or other patient management decisions. A negative result may occur with  improper specimen collection/handling, submission of specimen other than nasopharyngeal swab, presence of viral mutation(s) within the areas targeted by this assay, and inadequate number of viral copies (<131 copies/mL). A negative result must be combined with clinical observations, patient history, and epidemiological information. The expected result is Negative.  Fact Sheet for Patients:  PinkCheek.be  Fact Sheet for Healthcare Providers:  GravelBags.it  This test is no t yet approved or cleared by the [place] FDA and  has been authorized for detection and/or diagnosis of SARS-CoV-2 by FDA under an Emergency Use Authorization (EUA). This EUA will remain  in effect (meaning this test can be used) for the duration of the COVID-19 declaration under Section 564(b)(1) of the Act, 21 U.S.C. section 360bbb-3(b)(1), unless the authorization is terminated or revoked sooner.     Influenza A by PCR NEGATIVE NEGATIVE   Influenza B by PCR NEGATIVE NEGATIVE    Comment: (NOTE) The Xpert Xpress SARS-CoV-2/FLU/RSV assay is intended as an aid in  the diagnosis of influenza from Nasopharyngeal swab specimens and  should not be used as a sole basis for treatment. Nasal washings and  aspirates are unacceptable for Xpert Xpress SARS-CoV-2/FLU/RSV  testing.  Fact Sheet for Patients: PinkCheek.be  Fact Sheet for Healthcare Providers: GravelBags.it  This test is not yet approved or cleared by the [place] FDA and  has been authorized for detection and/or diagnosis of SARS-CoV-2 by  FDA under an Emergency Use Authorization (EUA). This EUA will remain   in effect (meaning this test can be used) for the duration of the  Covid-19 declaration under Section 564(b)(1) of the Act, 21  U.S.C. section 360bbb-3(b)(1), unless the  authorization is  terminated or revoked. Performed at [facility], [address] [place], [place] [address]   Lactic acid, plasma     Status: Abnormal   Collection Time: 07/21/20  6:18 PM  Result Value Ref Range   Lactic Acid, Venous 2.4 (HH) 0.5 - 1.9 mmol/L    Comment: CRITICAL RESULT CALLED TO, READ BACK BY AND VERIFIED WITH REED RENO @1846  07/21/20 MJU Performed at [facility], [address] [place], [place] [address]   Brain natriuretic peptide     Status: Abnormal   Collection Time: 07/21/20  6:18 PM  Result Value Ref Range   B Natriuretic Peptide 325.1 (H) 0.0 - 100.0 pg/mL    Comment: Performed at [facility], [place], [place] [address]  Troponin I (High Sensitivity)     Status: None   Collection Time: 07/21/20  6:18 PM  Result Value Ref Range   Troponin I (High Sensitivity) 12 <18 ng/L    Comment: (NOTE) Elevated high sensitivity troponin I (hsTnI) values and significant  changes across serial measurements may suggest ACS but many other  chronic and acute conditions are known to elevate hsTnI results.  Refer to the "Links" section for chest pain algorithms and additional  guidance. Performed at [facility], [address] [place], [place] [address]   HIV Antibody (routine testing w rflx)     Status: None   Collection Time: 07/21/20  6:18 PM  Result Value Ref Range   HIV Screen 4th Generation wRfx Non Reactive Non Reactive    Comment: Performed at [facility], [place] [address] [place], [place] [address]  Blood gas, venous     Status: Abnormal (Preliminary result)   Collection Time: 07/21/20 11:49 PM  Result Value Ref Range   pH, Ven 7.40 7.25 - 7.43   pCO2, Ven 42 (L) 44 - 60 mmHg   pO2, Ven PENDING 32 - 45 mmHg   Bicarbonate  26.0 20.0 - 28.0 mmol/L   Acid-Base Excess 1.1 0.0 - 2.0 mmol/L   O2 Saturation 34.7 %   Patient temperature 37.0    Collection site VEIN    Sample type VENOUS     Comment: Performed at [facility], [address] [place], [place] [address]   Mechanical Rate PENDING   Procalcitonin - Baseline     Status: None   Collection Time: 07/21/20 11:49 PM  Result Value Ref Range   Procalcitonin <0.10 ng/mL    Comment:        Interpretation: PCT (Procalcitonin) <= 0.5 ng/mL: Systemic infection (sepsis) is not likely. Local bacterial infection is possible. (NOTE)       Sepsis PCT Algorithm           Lower Respiratory Tract                                      Infection PCT Algorithm    ----------------------------     ----------------------------         PCT < 0.25 ng/mL                PCT < 0.10 ng/mL          Strongly encourage             Strongly discourage   discontinuation of antibiotics    initiation of antibiotics    ----------------------------     -----------------------------  PCT 0.25 - 0.50 ng/mL            PCT 0.10 - 0.25 ng/mL               OR       >80% decrease in PCT            Discourage initiation of                                            antibiotics      Encourage discontinuation           of antibiotics    ----------------------------     -----------------------------         PCT >= 0.50 ng/mL              PCT 0.26 - 0.50 ng/mL               AND        <80% decrease in PCT             Encourage initiation of                                             antibiotics       Encourage continuation           of antibiotics    ----------------------------     -----------------------------        PCT >= 0.50 ng/mL                  PCT > 0.50 ng/mL               AND         increase in PCT                  Strongly encourage                                      initiation of antibiotics    Strongly encourage escalation           of antibiotics                                      -----------------------------                                           PCT <= 0.25 ng/mL                                                 OR                                        > 80% decrease in PCT  Discontinue / Do not initiate                                             antibiotics  Performed at [facility], [address] [place], [place] [address]   Procalcitonin     Status: None   Collection Time: 07/22/20  4:00 AM  Result Value Ref Range   Procalcitonin 0.19 ng/mL    Comment:        Interpretation: PCT (Procalcitonin) <= 0.5 ng/mL: Systemic infection (sepsis) is not likely. Local bacterial infection is possible. (NOTE)       Sepsis PCT Algorithm           Lower Respiratory Tract                                      Infection PCT Algorithm    ----------------------------     ----------------------------         PCT < 0.25 ng/mL                PCT < 0.10 ng/mL          Strongly encourage             Strongly discourage   discontinuation of antibiotics    initiation of antibiotics    ----------------------------     -----------------------------       PCT 0.25 - 0.50 ng/mL            PCT 0.10 - 0.25 ng/mL               OR       >80% decrease in PCT            Discourage initiation of                                            antibiotics      Encourage discontinuation           of antibiotics    ----------------------------     -----------------------------         PCT >= 0.50 ng/mL              PCT 0.26 - 0.50 ng/mL               AND        <80% decrease in PCT             Encourage initiation of                                             antibiotics       Encourage continuation           of antibiotics    ----------------------------     -----------------------------        PCT >= 0.50 ng/mL                  PCT > 0.50 ng/mL               AND  increase in PCT                   Strongly encourage                                      initiation of antibiotics    Strongly encourage escalation           of antibiotics                                     -----------------------------                                           PCT <= 0.25 ng/mL                                                 OR                                        > 80% decrease in PCT                                      Discontinue / Do not initiate                                             antibiotics  Performed at [facility], [address] [place], [place] [address]   CBC with Differential/Platelet     Status: Abnormal   Collection Time: 07/22/20  4:00 AM  Result Value Ref Range   WBC 19.6 (H) 4.0 - 10.5 K/uL   RBC 3.10 (L) 4.22 - 5.81 MIL/uL   Hemoglobin 9.1 (L) 13.0 - 17.0 g/dL   HCT 27.5 (L) 39 - 52 %   MCV 88.7 80.0 - 100.0 fL   MCH 29.4 26.0 - 34.0 pg   MCHC 33.1 30.0 - 36.0 g/dL   RDW 14.4 11.5 - 15.5 %   Platelets 129 (L) 150 - 400 K/uL    Comment: Immature Platelet Fraction may be clinically indicated, consider ordering this additional test [id number]    nRBC 0.0 0.0 - 0.2 %   Neutrophils Relative % 79 %   Neutro Abs 15.5 (H) 1.7 - 7.7 K/uL   Lymphocytes Relative 7 %   Lymphs Abs 1.3 0.7 - 4.0 K/uL   Monocytes Relative 6 %   Monocytes Absolute 1.2 (H) 0.1 - 1.0 K/uL   Eosinophils Relative 0 %   Eosinophils Absolute 0.0 0.0 - 0.5 K/uL   Basophils Relative 0 %   Basophils Absolute 0.1 0.0 - 0.1 K/uL   WBC Morphology MILD LEFT SHIFT (1-5% METAS, OCC MYELO, OCC BANDS)    RBC Morphology MORPHOLOGY UNREMARKABLE    Smear Review Normal platelet morphology    Immature Granulocytes 8 %   Abs  Immature Granulocytes 1.57 (H) 0.00 - 0.07 K/uL    Comment: Performed at [facility], [address] [place], [place] [address]  APTT     Status: None   Collection Time: 07/22/20  4:00 AM  Result Value Ref Range   aPTT 31 24 - 36 seconds    Comment: Performed  at [facility], [address] [place], [place] [address]  Protime-INR     Status: None   Collection Time: 07/22/20  4:00 AM  Result Value Ref Range   Prothrombin Time 13.9 11.4 - 15.2 seconds   INR 1.1 0.8 - 1.2    Comment: (NOTE) INR goal varies based on device and disease states. Performed at [facility], [address] [place], [place] [address]    DG Chest 2 View  Result Date: 07/21/2020 CLINICAL DATA:  Pleural effusion, PleurX catheter in place. EXAM: CHEST - 2 VIEW COMPARISON:  July 13, 2020. FINDINGS: Similar appearance of a small partially loculated left pleural effusion with overlying airspace opacities. Similar more subtle opacities in the left upper lobe. Similar position of a left PleurX catheter. The right lung is clear. No visible pneumothorax. Similar cardiomediastinal silhouette, partially obscured. Left subclavian approach Port-A-Cath in similar position. No acute osseous abnormality. IMPRESSION: 1. Similar appearance of a small partially loculated left pleural effusion with overlying airspace opacities. Similar position of a left PleurX catheter. 2. Similar more subtle opacities in the left upper lobe. Electronically Signed   By: Margaretha Sheffield MD   On: 07/21/2020 14:26   DG Chest 2 View  Result Date: 07/21/2020 CLINICAL DATA:  Shortness of breath. EXAM: CHEST - 2 VIEW COMPARISON:  July 20, 2020. FINDINGS: Similar appearance of a small partially loculated left pleural effusion with overlying airspace opacities. Similar more subtle opacities in the left upper lobe. Similar position of a left PleurX catheter. The right lung is clear. No visible pneumothorax. Similar cardiomediastinal silhouette, partially obscured. Left subclavian approach Port-A-Cath in similar position. No acute osseous abnormality. IMPRESSION: 1. Similar appearance of a small partially loculated left pleural effusion with overlying airspace opacities. Similar position of a left  PleurX catheter. 2. Similar more subtle opacities in the left upper lobe. Electronically Signed   By: Margaretha Sheffield MD   On: 07/21/2020 14:25   CT ANGIO CHEST PE W OR WO CONTRAST  Result Date: 07/22/2020 CLINICAL DATA:  Shortness of breath, lung cancer EXAM: CT ANGIOGRAPHY CHEST WITH CONTRAST TECHNIQUE: Multidetector CT imaging of the chest was performed using the standard protocol during bolus administration of intravenous contrast. Multiplanar CT image reconstructions and MIPs were obtained to evaluate the vascular anatomy. CONTRAST:  64mL OMNIPAQUE IOHEXOL 350 MG/ML SOLN COMPARISON:  06/05/2020 FINDINGS: Cardiovascular: Filling defects seen within multiple right pulmonary arteries in all lobes of the right lung compatible with pulmonary emboli. No definite pulmonary emboli on the left. RV: LV ratio is mildly elevated at 1.0. Heart is mildly enlarged. 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 Coronary artery and aortic calcifications. Mediastinum/Nodes: Enlarging prevascular lymph nodes since prior study, measuring up to 10 mm currently, not measurable on prior study. Subcarinal adenopathy has increased, measuring 2 cm in short axis diameter compared to 1.5 cm previously. Abnormal left hilar lymph nodes, increasing since prior study. Lungs/Pleura: Small loculated pleural effusion again noted, similar prior study with PleurX catheter in place. Pleural soft tissue masses are noted throughout the left pleural space. Moderate emphysema. No confluent opacity or effusion on the right. Masslike area  of airspace disease/consolidation sound in the left lower lobe, encases the left lower lobe pulmonary arteries and measures 6.6 x 5.6 cm. Upper Abdomen: Bulky mass anteriorly in the left cardiophrenic region and extending between to left ribs has enlarged, measuring 4.9 x 3.9 cm compared to 4.4 x 3.2 cm previously. Small hypodensities throughout the liver compatible with cysts. Gastrohepatic ligament  adenopathy worsened with short axis diameter measuring up 2 1.5 cm compared with 1.2 cm previously. Musculoskeletal: Left Port-A-Cath in place. No acute bony abnormality. Review of the MIP images confirms the above findings. IMPRESSION: Pulmonary emboli seen within all lobes of the right lung. Slightly elevated RV: LV ratio compatible with right heart strain. Worsening masslike consolidation in the left lower lobe. Pleural nodularity noted throughout the left pleural space with small loculated left pleural effusion. Worsening mediastinal and upper abdominal adenopathy. Worsening left anterior cardiophrenic/abdominal wall mass which extends between 2 of the left ribs. Coronary artery disease. Aortic Atherosclerosis (ICD10-I70.0) and Emphysema (ICD10-J43.9). These results were called by telephone at the time of interpretation on 07/22/2020 at 1:05 am to provider Rufina Falco , who verbally acknowledged these results. Electronically Signed   By: Rolm Baptise M.D.   On: 07/22/2020 01:06   US Venous Img Lower Bilateral (DVT)  Result Date: 07/21/2020 CLINICAL DATA:  Lower extremity edema EXAM: BILATERAL LOWER EXTREMITY VENOUS DOPPLER ULTRASOUND TECHNIQUE: Gray-scale sonography with graded compression, as well as color Doppler and duplex ultrasound were performed to evaluate the lower extremity deep venous systems from the level of the common femoral vein and including the common femoral, femoral, profunda femoral, popliteal and calf veins including the posterior tibial, peroneal and gastrocnemius veins when visible. The superficial great saphenous vein was also interrogated. Spectral Doppler was utilized to evaluate flow at rest and with distal augmentation maneuvers in the common femoral, femoral and popliteal veins. COMPARISON:  None. FINDINGS: RIGHT LOWER EXTREMITY Common Femoral Vein: No evidence of thrombus. Normal compressibility, respiratory phasicity and response to augmentation. Saphenofemoral Junction: No  evidence of thrombus. Normal compressibility and flow on color Doppler imaging. Profunda Femoral Vein: No evidence of thrombus. Normal compressibility and flow on color Doppler imaging. Femoral Vein: No evidence of thrombus. Normal compressibility, respiratory phasicity and response to augmentation. Popliteal Vein: Thrombus is noted with decreased compressibility. Calf Veins: Thrombus is noted within the posterior tibial and peroneal veins with decreased compressibility. Superficial Great Saphenous Vein: No evidence of thrombus. Normal compressibility. Venous Reflux:  None. Other Findings:  None. LEFT LOWER EXTREMITY Common Femoral Vein: No evidence of thrombus. Normal compressibility, respiratory phasicity and response to augmentation. Saphenofemoral Junction: No evidence of thrombus. Normal compressibility and flow on color Doppler imaging. Profunda Femoral Vein: No evidence of thrombus. Normal compressibility and flow on color Doppler imaging. Femoral Vein: No evidence of thrombus. Normal compressibility, respiratory phasicity and response to augmentation. Popliteal Vein: Thrombus is noted with decreased compressibility. Calf Veins: Peroneal vein demonstrates thrombus with decreased compressibility. Superficial Great Saphenous Vein: No evidence of thrombus. Normal compressibility. Venous Reflux:  None. Other Findings:  None. IMPRESSION: Bilateral deep venous thrombosis involving the popliteal and calf veins. Electronically Signed   By: Inez Catalina M.D.   On: 07/21/2020 18:06    Assessment:  The patient is a 74 y.o. gentleman with extensive stage small cell lung cancer currently day 17 s/p cycle #1 carboplatin, etoposide and Tecentriq who was admitted shortness of breath secondary to pulmonary emboli with bilateral lower extremity DVTs.  Chest CT angiogram on 07/22/2020 revealed pulmonary emboli seen  within all lobes of the right lung. There was slightly elevated RV: LV ratio compatible with right heart  strain.  There was worsening masslike consolidation in the left lower lobe. There was pleural nodularity noted throughout the left pleural space with small loculated left pleural effusion.  There was worsening mediastinal and upper abdominal adenopathy.  There was worsening left anterior cardiophrenic/abdominal wall mass which extended between 2 of the left ribs.  Bilateral lower extremity ultrasound on 07/21/2020 revealed bilateral DVTs involving the popliteal and calf veins.  Symptomatically, he is feeling less short of breath on 4 liter oxygen via nasal cannula.  He has chronic discomfort in his left chest.  Plan:   1.   Extensive stage small cell lung cancer  He is day 17 s/p cycle #1 carboplatin, etoposide and Tecentriq.   Counts have recovered s/p chemotherapy.   WBC likely elevated secondary to Neulasta.  Current imaging studies reviewed with patient and his wife.  Images compared to PET scan from 06/05/2020.   Patient received chemotherapy on 07/06/2020 (1 month later).   Patient had rapidly growing disease.  Unable to assess if there has been growth since chemotherapy given the month delay.  Discuss patient's thoughts about treatment.   He is interested in pursuing another cycle of treatment next week.   Discuss plan for follow-up imaging after next cycle to assess disease status.  Patient aware that treatment is palliative.  We discussed evaluation by [name], NP in palliative care medicine, today.   Patient in agreement.  Multiple questions asked and answered. 2.  Pulmonary emboli and bilateral lower extremity DVTs  Patient currently on heparin.  Discuss plan for long term anticoagulation.  Vascular surgery has been consulted.  Consider thrombectomy +/- IVC filter secondary to poor pulmonary reserve.  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 3.  Consolidation left lower lobe  Possibly postobstructive  pneumonia.  Patient on azithromycin and ceftriaxone. 4.  Code status  Patient notes DNR/DNI.  Approximately 45 minutes were spent with patient discussing current clinic status and direction of therapy.  Thank you for allowing me to participate in Dwayne Thompson 's care.  I will follow him closely with you while hospitalized and after discharge in the outpatient department.   Lequita Asal, MD  07/22/2020, 6:08 AM

## 2020-07-22 NOTE — Interval H&P Note (Signed)
History and Physical Interval Note:  07/22/2020 12:31 PM  Dwayne Thompson  has presented today for surgery, with the diagnosis of pulmonary embolus.  The various methods of treatment have been discussed with the patient and family. After consideration of risks, benefits and other options for treatment, the patient has consented to  Procedure(s): PULMONARY THROMBECTOMY (N/A) as a surgical intervention.  The patient's history has been reviewed, patient examined, no change in status, stable for surgery.  I have reviewed the patient's chart and labs.  Questions were answered to the patient's satisfaction.     [name]

## 2020-07-22 NOTE — Progress Notes (Signed)
PROGRESS NOTE    Dwayne Thompson  YBO:[id number] DOB: 07/29/1946 DOA: 07/21/2020 PCP: Jerrol Banana., MD     Brief Narrative:  Dwayne Thompson is a 74 y.o. male with medical history significant of COPD, HTN, cirrhosis, small cell lung cancer diagnosed October 2021. Pt just had one round of chemo therapy. Per reports, SOB worsening for past few days and is only on prn oxygen.  In the emergency department, work-up revealed right-sided pulmonary embolism with bilateral lower extremity DVT.  Vascular surgery was consulted for pulmonary thrombectomy and oncology was consulted as well.  New events last 24 hours / Subjective: Continues to have some shortness of breath.  Currently on 4 L nasal cannula at rest.  He has been on 2 L at nighttime since his discharge from the hospital in October.  He denies any chest pain, although is asking for his home pain medication oxycodone.  Assessment & Plan:   Principal Problem:   Acute on chronic respiratory failure with hypoxia (HCC) Active Problems:   BPH with obstruction/lower urinary tract symptoms   Small cell lung cancer (HCC)   COPD (chronic obstructive pulmonary disease) (HCC)   Pulmonary embolism (HCC)   Pulmonary embolism, bilateral lower extremity DVT -Continue IV heparin -Echocardiogram pending -Vascular surgery consulted for pulmonary thrombectomy  Extensive small cell lung cancer -Chronic Pleurx catheter in place -Oncology following -Palliative care consulted  Severe sepsis secondary to postobstructive pneumonia of the left lower lobe, present on admission -Presented with leukocytosis WBC 18, tachycardia, tachypnea with respiratory failure -Continue Rocephin, azithromycin  Acute on chronic hypoxemic respiratory failure  -Secondary to PE, SCLC, post-obstructive pneumonia -Currently on 4L [place] O2 (was on 2L qhs prior to admission)   BPH -Continue dutasteride   Chronic hypotension -Continue Midodrine  Hyperlipidemia -Continue  Crestor  In agreement with assessment of the pressure ulcer as below:  Pressure Injury 06/23/20 Buttocks Right Stage 2 -  Partial thickness loss of dermis presenting as a shallow open injury with a red, pink wound bed without slough. 2.5 x 1.2 (Active)  06/23/20 0608  Location: Buttocks  Location Orientation: Right  Staging: Stage 2 -  Partial thickness loss of dermis presenting as a shallow open injury with a red, pink wound bed without slough.  Wound Description (Comments): 2.5 x 1.2  Present on Admission: Yes         DVT prophylaxis: IV heparin SCDs Start: 07/21/20 1528  Code Status: DNR Family Communication: Spouse on speaker phone during my examination Disposition Plan:  Status is: Inpatient  Remains inpatient appropriate because:Ongoing diagnostic testing needed not appropriate for outpatient work up, IV treatments appropriate due to intensity of illness or inability to take PO and Inpatient level of care appropriate due to severity of illness   Dispo: The patient is from: Home              Anticipated d/c is to: Home              Anticipated d/c date is: 3 days              Patient currently is not medically stable to d/c.  Remains on IV heparin.  Pulmonary thrombectomy planned for this morning.   Consultants:   Vascular surgery  Oncology  Procedures:   Pulmonary thrombectomy 11/3  Antimicrobials:  Anti-infectives (From admission, onward)   Start     Dose/Rate Route Frequency Ordered Stop   07/21/20 2000  [MAR Hold]  azithromycin (ZITHROMAX) 500 mg in  sodium chloride 0.9 % 250 mL IVPB        (MAR Hold since Wed 07/22/2020 at 1035.Hold Reason: Transfer to a Procedural area.)   500 mg 250 mL/hr over 60 Minutes Intravenous Every 24 hours 07/21/20 1931     07/21/20 1945  [MAR Hold]  cefTRIAXone (ROCEPHIN) 1 g in sodium chloride 0.9 % 100 mL IVPB        (MAR Hold since Wed 07/22/2020 at 1035.Hold Reason: Transfer to a Procedural area.)   1 g 200 mL/hr over 30  Minutes Intravenous Every 24 hours 07/21/20 1931 07/26/20 1944        Objective: Vitals:   07/22/20 0630 07/22/20 0700 07/22/20 0715 07/22/20 1110  BP: 102/72 98/65 106/67 105/69  Pulse: 96 97 (!) 104 92  Resp: 18 19 (!) 21 18  Temp:   98 F (36.7 C) 97.9 F (36.6 C)  TempSrc:   Oral Oral  SpO2: 99% 98% 97% 96%  Weight:    80 kg  Height:    5\' 11"  (1.803 m)    Intake/Output Summary (Last 24 hours) at 07/22/2020 1236 Last data filed at 07/22/2020 0715 Gross per 24 hour  Intake 250 ml  Output 175 ml  Net 75 ml   Filed Weights   07/21/20 1329 07/22/20 1110  Weight: 80 kg 80 kg    Examination:  General exam: Appears calm and comfortable  Respiratory system: Clear to auscultation anteriorly.  Respiratory effort is normal without conversational dyspnea.  On 4 L nasal cannula O2 Cardiovascular system: S1 & S2 heard, tachycardic, regular rhythm. No murmurs.  Mild bilateral pedal edema. Gastrointestinal system: Abdomen is nondistended, soft and nontender. Normal bowel sounds heard. Central nervous system: Alert and oriented. No focal neurological deficits. Speech clear.  Extremities: Symmetric in appearance  Skin: No rashes, lesions or ulcers on exposed skin  Psychiatry: Judgement and insight appear normal. Mood & affect appropriate.   Data Reviewed: I have personally reviewed following labs and imaging studies  CBC: Recent Labs  Lab 07/15/20 1319 07/21/20 1333 07/22/20 0400  WBC 10.5 18.0* 19.6*  NEUTROABS 8.7*  --  15.5*  HGB 9.5* 10.2* 9.1*  HCT 28.1* 30.6* 27.5*  MCV 86.2 88.2 88.7  PLT 85* 133* 161*   Basic Metabolic Panel: Recent Labs  Lab 07/15/20 1319 07/21/20 1333  NA 128* 135  K 4.1 3.8  CL 94* 101  CO2 24 22  GLUCOSE 105* 125*  BUN 14 17  CREATININE 0.95 0.90  CALCIUM 8.4* 8.9  MG 2.1  --    GFR: Estimated Creatinine Clearance: 76.7 mL/min (by C-G formula based on SCr of 0.9 mg/dL). Liver Function Tests: No results for input(s): AST, ALT,  ALKPHOS, BILITOT, PROT, ALBUMIN in the last 168 hours. No results for input(s): LIPASE, AMYLASE in the last 168 hours. No results for input(s): AMMONIA in the last 168 hours. Coagulation Profile: Recent Labs  Lab 07/22/20 0400  INR 1.1   Cardiac Enzymes: No results for input(s): CKTOTAL, CKMB, CKMBINDEX, TROPONINI in the last 168 hours. BNP (last 3 results) No results for input(s): PROBNP in the last 8760 hours. HbA1C: No results for input(s): HGBA1C in the last 72 hours. CBG: No results for input(s): GLUCAP in the last 168 hours. Lipid Profile: No results for input(s): CHOL, HDL, LDLCALC, TRIG, CHOLHDL, LDLDIRECT in the last 72 hours. Thyroid Function Tests: No results for input(s): TSH, T4TOTAL, FREET4, T3FREE, THYROIDAB in the last 72 hours. Anemia Panel: No results for input(s): VITAMINB12, FOLATE,  FERRITIN, TIBC, IRON, RETICCTPCT in the last 72 hours. Sepsis Labs: Recent Labs  Lab 07/21/20 1818 07/21/20 2349 07/22/20 0400  PROCALCITON  --  <0.10 0.19  LATICACIDVEN 2.4*  --   --     Recent Results (from the past 240 hour(s))  Respiratory Panel by RT PCR (Flu A&B, Covid) - Nasopharyngeal Swab     Status: None   Collection Time: 07/21/20  4:12 PM   Specimen: Nasopharyngeal Swab  Result Value Ref Range Status   SARS Coronavirus 2 by RT PCR NEGATIVE NEGATIVE Final    Comment: (NOTE) SARS-CoV-2 target nucleic acids are NOT DETECTED.  The SARS-CoV-2 RNA is generally detectable in upper respiratoy specimens during the acute phase of infection. The lowest concentration of SARS-CoV-2 viral copies this assay can detect is 131 copies/mL. A negative result does not preclude SARS-Cov-2 infection and should not be used as the sole basis for treatment or other patient management decisions. A negative result may occur with  improper specimen collection/handling, submission of specimen other than nasopharyngeal swab, presence of viral mutation(s) within the areas targeted by this  assay, and inadequate number of viral copies (<131 copies/mL). A negative result must be combined with clinical observations, patient history, and epidemiological information. The expected result is Negative.  Fact Sheet for Patients:  PinkCheek.be  Fact Sheet for Healthcare Providers:  GravelBags.it  This test is no t yet approved or cleared by the [place] FDA and  has been authorized for detection and/or diagnosis of SARS-CoV-2 by FDA under an Emergency Use Authorization (EUA). This EUA will remain  in effect (meaning this test can be used) for the duration of the COVID-19 declaration under Section 564(b)(1) of the Act, 21 U.S.C. section 360bbb-3(b)(1), unless the authorization is terminated or revoked sooner.     Influenza A by PCR NEGATIVE NEGATIVE Final   Influenza B by PCR NEGATIVE NEGATIVE Final    Comment: (NOTE) The Xpert Xpress SARS-CoV-2/FLU/RSV assay is intended as an aid in  the diagnosis of influenza from Nasopharyngeal swab specimens and  should not be used as a sole basis for treatment. Nasal washings and  aspirates are unacceptable for Xpert Xpress SARS-CoV-2/FLU/RSV  testing.  Fact Sheet for Patients: PinkCheek.be  Fact Sheet for Healthcare Providers: GravelBags.it  This test is not yet approved or cleared by the [place] FDA and  has been authorized for detection and/or diagnosis of SARS-CoV-2 by  FDA under an Emergency Use Authorization (EUA). This EUA will remain  in effect (meaning this test can be used) for the duration of the  Covid-19 declaration under Section 564(b)(1) of the Act, 21  U.S.C. section 360bbb-3(b)(1), unless the authorization is  terminated or revoked. Performed at [facility], [address], [place], [place] [address]       Radiology Studies: DG Chest 2 View  Result Date: 07/21/2020 CLINICAL  DATA:  Pleural effusion, PleurX catheter in place. EXAM: CHEST - 2 VIEW COMPARISON:  July 13, 2020. FINDINGS: Similar appearance of a small partially loculated left pleural effusion with overlying airspace opacities. Similar more subtle opacities in the left upper lobe. Similar position of a left PleurX catheter. The right lung is clear. No visible pneumothorax. Similar cardiomediastinal silhouette, partially obscured. Left subclavian approach Port-A-Cath in similar position. No acute osseous abnormality. IMPRESSION: 1. Similar appearance of a small partially loculated left pleural effusion with overlying airspace opacities. Similar position of a left PleurX catheter. 2. Similar more subtle opacities in the left upper lobe. Electronically Signed  By: Margaretha Sheffield MD   On: 07/21/2020 14:26   DG Chest 2 View  Result Date: 07/21/2020 CLINICAL DATA:  Shortness of breath. EXAM: CHEST - 2 VIEW COMPARISON:  July 20, 2020. FINDINGS: Similar appearance of a small partially loculated left pleural effusion with overlying airspace opacities. Similar more subtle opacities in the left upper lobe. Similar position of a left PleurX catheter. The right lung is clear. No visible pneumothorax. Similar cardiomediastinal silhouette, partially obscured. Left subclavian approach Port-A-Cath in similar position. No acute osseous abnormality. IMPRESSION: 1. Similar appearance of a small partially loculated left pleural effusion with overlying airspace opacities. Similar position of a left PleurX catheter. 2. Similar more subtle opacities in the left upper lobe. Electronically Signed   By: Margaretha Sheffield MD   On: 07/21/2020 14:25   CT ANGIO CHEST PE W OR WO CONTRAST  Result Date: 07/22/2020 CLINICAL DATA:  Shortness of breath, lung cancer EXAM: CT ANGIOGRAPHY CHEST WITH CONTRAST TECHNIQUE: Multidetector CT imaging of the chest was performed using the standard protocol during bolus administration of intravenous  contrast. Multiplanar CT image reconstructions and MIPs were obtained to evaluate the vascular anatomy. CONTRAST:  53mL OMNIPAQUE IOHEXOL 350 MG/ML SOLN COMPARISON:  06/05/2020 FINDINGS: Cardiovascular: Filling defects seen within multiple right pulmonary arteries in all lobes of the right lung compatible with pulmonary emboli. No definite pulmonary emboli on the left. RV: LV ratio is mildly elevated at 1.0. Heart is mildly enlarged. 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 Coronary artery and aortic calcifications. Mediastinum/Nodes: Enlarging prevascular lymph nodes since prior study, measuring up to 10 mm currently, not measurable on prior study. Subcarinal adenopathy has increased, measuring 2 cm in short axis diameter compared to 1.5 cm previously. Abnormal left hilar lymph nodes, increasing since prior study. Lungs/Pleura: Small loculated pleural effusion again noted, similar prior study with PleurX catheter in place. Pleural soft tissue masses are noted throughout the left pleural space. Moderate emphysema. No confluent opacity or effusion on the right. Masslike area of airspace disease/consolidation sound in the left lower lobe, encases the left lower lobe pulmonary arteries and measures 6.6 x 5.6 cm. Upper Abdomen: Bulky mass anteriorly in the left cardiophrenic region and extending between to left ribs has enlarged, measuring 4.9 x 3.9 cm compared to 4.4 x 3.2 cm previously. Small hypodensities throughout the liver compatible with cysts. Gastrohepatic ligament adenopathy worsened with short axis diameter measuring up 2 1.5 cm compared with 1.2 cm previously. Musculoskeletal: Left Port-A-Cath in place. No acute bony abnormality. Review of the MIP images confirms the above findings. IMPRESSION: Pulmonary emboli seen within all lobes of the right lung. Slightly elevated RV: LV ratio compatible with right heart strain. Worsening masslike consolidation in the left lower lobe. Pleural nodularity  noted throughout the left pleural space with small loculated left pleural effusion. Worsening mediastinal and upper abdominal adenopathy. Worsening left anterior cardiophrenic/abdominal wall mass which extends between 2 of the left ribs. Coronary artery disease. Aortic Atherosclerosis (ICD10-I70.0) and Emphysema (ICD10-J43.9). These results were called by telephone at the time of interpretation on 07/22/2020 at 1:05 am to provider Rufina Falco , who verbally acknowledged these results. Electronically Signed   By: Rolm Baptise M.D.   On: 07/22/2020 01:06   US Venous Img Lower Bilateral (DVT)  Result Date: 07/21/2020 CLINICAL DATA:  Lower extremity edema EXAM: BILATERAL LOWER EXTREMITY VENOUS DOPPLER ULTRASOUND TECHNIQUE: Gray-scale sonography with graded compression, as well as color Doppler and duplex ultrasound were performed to evaluate the lower extremity  deep venous systems from the level of the common femoral vein and including the common femoral, femoral, profunda femoral, popliteal and calf veins including the posterior tibial, peroneal and gastrocnemius veins when visible. The superficial great saphenous vein was also interrogated. Spectral Doppler was utilized to evaluate flow at rest and with distal augmentation maneuvers in the common femoral, femoral and popliteal veins. COMPARISON:  None. FINDINGS: RIGHT LOWER EXTREMITY Common Femoral Vein: No evidence of thrombus. Normal compressibility, respiratory phasicity and response to augmentation. Saphenofemoral Junction: No evidence of thrombus. Normal compressibility and flow on color Doppler imaging. Profunda Femoral Vein: No evidence of thrombus. Normal compressibility and flow on color Doppler imaging. Femoral Vein: No evidence of thrombus. Normal compressibility, respiratory phasicity and response to augmentation. Popliteal Vein: Thrombus is noted with decreased compressibility. Calf Veins: Thrombus is noted within the posterior tibial and peroneal  veins with decreased compressibility. Superficial Great Saphenous Vein: No evidence of thrombus. Normal compressibility. Venous Reflux:  None. Other Findings:  None. LEFT LOWER EXTREMITY Common Femoral Vein: No evidence of thrombus. Normal compressibility, respiratory phasicity and response to augmentation. Saphenofemoral Junction: No evidence of thrombus. Normal compressibility and flow on color Doppler imaging. Profunda Femoral Vein: No evidence of thrombus. Normal compressibility and flow on color Doppler imaging. Femoral Vein: No evidence of thrombus. Normal compressibility, respiratory phasicity and response to augmentation. Popliteal Vein: Thrombus is noted with decreased compressibility. Calf Veins: Peroneal vein demonstrates thrombus with decreased compressibility. Superficial Great Saphenous Vein: No evidence of thrombus. Normal compressibility. Venous Reflux:  None. Other Findings:  None. IMPRESSION: Bilateral deep venous thrombosis involving the popliteal and calf veins. Electronically Signed   By: Inez Catalina M.D.   On: 07/21/2020 18:06      Scheduled Meds: . [MAR Hold] Chlorhexidine Gluconate Cloth  6 each Topical Daily  . [MAR Hold] dutasteride  0.5 mg Oral Daily  . fentaNYL      . heparin sodium (porcine)      . [MAR Hold] ipratropium-albuterol  3 mL Nebulization Q6H  . [MAR Hold] megestrol  200 mg Oral Daily  . midazolam      . [MAR Hold] midodrine  10 mg Oral QID  . [MAR Hold] pantoprazole  20 mg Oral BID  . [MAR Hold] rosuvastatin  10 mg Oral Daily  . [MAR Hold] sodium chloride flush  10-40 mL Intracatheter Q12H  . [MAR Hold] sodium chloride flush  3 mL Intravenous Q12H   Continuous Infusions: . [MAR Hold] sodium chloride    . sodium chloride    . [MAR Hold] azithromycin Stopped (07/21/20 2346)  . [MAR Hold] cefTRIAXone (ROCEPHIN)  IV Stopped (07/21/20 2235)  . heparin 1,400 Units/hr (07/22/20 0407)     LOS: 0 days      Time spent: 40 minutes   Dessa Phi,  DO [facility] 07/22/2020, 12:36 PM   Available via Epic secure chat 7am-7pm After these hours, please refer to coverage provider listed on [email or web address]

## 2020-07-22 NOTE — Consult Note (Signed)
[place]  [phone] [phone]   Name: Dwayne Thompson Date: 07/22/2020 MRN: [id number]  DOB: 1945-09-20  Patient Care Team: Jerrol Banana., MD as PCP - General (Family Medicine) Virgel Manifold, MD as Consulting Physician (Gastroenterology) Lequita Asal, MD as Referring Physician (Hematology and Oncology) Tyler Pita, MD as Consulting Physician (Pulmonary Disease) Dimmig, Marcello Moores, MD as Referring Physician (Orthopedic Surgery) Royston Cowper, MD as Consulting Physician (Urology) Telford Nab, RN as Oncology Nurse Navigator    REASON FOR CONSULTATION: Dwayne Thompson is a 74 y.o. 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  He was admitted to the hospital on 07/21/2020 with shortness of breath.  Patient was found to have bilateral lower extremity DVTs and extensive pulmonary emboli in all lobes of the right lung with evidence of right heart strain.  He was also noted to have worsening mediastinal and upper abdominal adenopathy and worsening cardiophrenic/abdominal wall mass on the CTA of the chest.  Patient underwent mechanical thrombectomy on 07/21/2020.  Palliative care is consulted to help address goals.   SOCIAL HISTORY:     reports that he quit smoking about 6 years ago. His smoking use included cigarettes. He has a 50.00 pack-year smoking history. He has never used smokeless tobacco. He reports previous alcohol use. He reports that he does not use drugs.  Patient is married and lives at home with his wife.  ADVANCE DIRECTIVES:  None on file  CODE STATUS: DNR  PAST MEDICAL HISTORY: Past Medical History:    Diagnosis Date  . Anemia   . Back pain   . BPH (benign prostatic hyperplasia)   . Cirrhosis ([place])   . COPD (chronic obstructive pulmonary disease) ([place])   . ED (erectile dysfunction)   . GERD (gastroesophageal reflux disease)   . Hyperlipidemia   . Hypertension     PAST SURGICAL HISTORY:  Past Surgical History:  Procedure Laterality Date  . COLONOSCOPY Left 01/03/2018   Procedure: COLONOSCOPY;  Surgeon: Virgel Manifold, MD;  Location: [facility] ENDOSCOPY;  Service: Endoscopy;  Laterality: Left;  . COLONOSCOPY WITH PROPOFOL N/A 04/05/2018   Procedure: COLONOSCOPY WITH PROPOFOL;  Surgeon: Virgel Manifold, MD;  Location: [facility] ENDOSCOPY;  Service: Endoscopy;  Laterality: N/A;  . CYSTOSCOPY WITH STENT PLACEMENT Right 09/26/2019   Procedure: CYSTOSCOPY WITH STENT PLACEMENT;  Surgeon: Royston Cowper, MD;  Location: [facility] ORS;  Service: Urology;  Laterality: Right;  . ENTEROSCOPY  01/03/2018   Procedure: ENTEROSCOPY;  Surgeon: Virgel Manifold, MD;  Location: [facility] ENDOSCOPY;  Service: Endoscopy;;  . ESOPHAGOGASTRODUODENOSCOPY N/A 01/02/2018   Procedure: ESOPHAGOGASTRODUODENOSCOPY (EGD);  Surgeon: Virgel Manifold, MD;  Location: [facility] ENDOSCOPY;  Service: Endoscopy;  Laterality: N/A;  . ESOPHAGOGASTRODUODENOSCOPY (EGD) WITH PROPOFOL N/A 04/05/2018   Procedure: ESOPHAGOGASTRODUODENOSCOPY (EGD) WITH PROPOFOL;  Surgeon: Virgel Manifold, MD;  Location: [facility] ENDOSCOPY;  Service: Endoscopy;  Laterality: N/A;  . exploration and neurolysis of right peroneal nerve at the knee Right 08/27/2018  . GREEN LIGHT LASER TURP (TRANSURETHRAL RESECTION OF PROSTATE N/A 09/26/2019   Procedure: GREEN LIGHT LASER TURP (TRANSURETHRAL RESECTION OF PROSTATE;  Surgeon: Royston Cowper, MD;  Location: [facility] ORS;  Service: Urology;  Laterality: N/A;  . HERNIA REPAIR     1980's  .  PORTACATH PLACEMENT Left 06/29/2020   Procedure: INSERTION PORT-A-CATH;  Surgeon: [name], MD;  Location: [facility] ORS;  Service:  Thoracic;  Laterality: Left;  Marland Kitchen VIDEO ASSISTED THORACOSCOPY (VATS)/THOROCOTOMY Left 06/29/2020   Procedure: VIDEO ASSISTED THORACOSCOPY;  Surgeon: [name], MD;  Location: [facility] ORS;  Service: Thoracic;  Laterality: Left;    HEMATOLOGY/ONCOLOGY HISTORY:  Oncology History  Small cell lung cancer ([place])  06/27/2020 Initial Diagnosis   Small cell lung cancer ([place])   07/06/2020 -  Chemotherapy   The patient had palonosetron (ALOXI) injection 0.25 mg, 0.25 mg, Intravenous,  Once, 1 of 4 cycles Administration: 0.25 mg (07/06/2020) pegfilgrastim (NEULASTA) injection 6 mg, 6 mg, Subcutaneous, Once, 1 of 4 cycles Administration: 6 mg (07/09/2020) CARBOplatin (PARAPLATIN) 520 mg in sodium chloride 0.9 % 250 mL chemo infusion, 520 mg (100 % of original dose 523 mg), Intravenous,  Once, 1 of 4 cycles Dose modification:   (original dose 523 mg, Cycle 1) Administration: 520 mg (07/06/2020) etoposide (VEPESID) 140 mg in sodium chloride 0.9 % 500 mL chemo infusion, 65 mg/m2 = 140 mg (65 % of original dose 100 mg/m2), Intravenous,  Once, 1 of 4 cycles Dose modification: 70 mg/m2 (original dose 100 mg/m2, Cycle 1, Reason: Provider Judgment, Comment: low albumen), 65 mg/m2 (original dose 100 mg/m2, Cycle 1, Reason: Provider Judgment, Comment: low albumen) Administration: 140 mg (07/06/2020), 140 mg (07/07/2020), 140 mg (07/08/2020) fosaprepitant (EMEND) 150 mg in sodium chloride 0.9 % 145 mL IVPB, 150 mg, Intravenous,  Once, 1 of 4 cycles Administration: 150 mg (07/06/2020) atezolizumab (TECENTRIQ) 1,200 mg in sodium chloride 0.9 % 250 mL chemo infusion, 1,200 mg, Intravenous, Once, 1 of 8 cycles Administration: 1,200 mg (07/06/2020)  for chemotherapy treatment.      ALLERGIES:  is allergic to indomethacin, tamsulosin hcl, and sulfa antibiotics.  MEDICATIONS:  Current Facility-Administered Medications  Medication Dose Route Frequency Provider Last Rate Last Admin  . 0.9 %  sodium chloride infusion   250 mL Intravenous PRN Algernon Huxley, MD      . acetaminophen (TYLENOL) tablet 650 mg  650 mg Oral Q6H PRN Algernon Huxley, MD      . azithromycin (ZITHROMAX) 500 mg in sodium chloride 0.9 % 250 mL IVPB  500 mg Intravenous Q24H Algernon Huxley, MD   Stopped at 07/21/20 2346  . cefTRIAXone (ROCEPHIN) 1 g in sodium chloride 0.9 % 100 mL IVPB  1 g Intravenous Q24H Algernon Huxley, MD   Stopped at 07/21/20 2235  . Chlorhexidine Gluconate Cloth 2 % PADS 6 each  6 each Topical Daily Dew, Erskine Squibb, MD      . dutasteride (AVODART) capsule 0.5 mg  0.5 mg Oral Daily Dew, Erskine Squibb, MD      . fentaNYL (SUBLIMAZE) 100 MCG/2ML injection           . heparin ADULT infusion 100 units/mL (25000 units/21mL sodium chloride 0.45%)  1,400 Units/hr Intravenous Continuous Algernon Huxley, MD 14 mL/hr at 07/22/20 1559 1,400 Units/hr at 07/22/20 1559  . ipratropium-albuterol (DUONEB) 0.5-2.5 (3) MG/3ML nebulizer solution 3 mL  3 mL Nebulization Q6H Algernon Huxley, MD   3 mL at 07/21/20 1628  . ipratropium-albuterol (DUONEB) 0.5-2.5 (3) MG/3ML nebulizer solution 3 mL  3 mL Nebulization Q6H PRN Algernon Huxley, MD      . megestrol (MEGACE) 40 MG/ML suspension 200 mg  200 mg Oral Daily Dew, Erskine Squibb, MD      . midazolam (VERSED) 5 MG/5ML injection           .  midodrine (PROAMATINE) tablet 10 mg  10 mg Oral QID Algernon Huxley, MD      . oxyCODONE (Oxy IR/ROXICODONE) immediate release tablet 15 mg  15 mg Oral Q4H PRN Algernon Huxley, MD   15 mg at 07/22/20 1600  . pantoprazole (PROTONIX) EC tablet 20 mg  20 mg Oral BID Algernon Huxley, MD      . rosuvastatin (CRESTOR) tablet 10 mg  10 mg Oral Daily Dew, Erskine Squibb, MD      . sodium chloride flush (NS) 0.9 % injection 10-40 mL  10-40 mL Intracatheter Q12H Dew, Erskine Squibb, MD      . sodium chloride flush (NS) 0.9 % injection 10-40 mL  10-40 mL Intracatheter PRN Algernon Huxley, MD      . sodium chloride flush (NS) 0.9 % injection 3 mL  3 mL Intravenous Q12H Algernon Huxley, MD   3 mL at 07/21/20 2220  . sodium chloride  flush (NS) 0.9 % injection 3 mL  3 mL Intravenous PRN Algernon Huxley, MD       Facility-Administered Medications Ordered in Other Encounters  Medication Dose Route Frequency Provider Last Rate Last Admin  . sodium chloride flush (NS) 0.9 % injection 10 mL  10 mL Intracatheter PRN Lequita Asal, MD   10 mL at 07/06/20 0808    VITAL SIGNS: BP 91/63   Pulse 90   Temp 97.9 F (36.6 C) (Oral)   Resp 18   Ht 5\' 11"  (1.803 m)   Wt 176 lb 5.9 oz (80 kg)   SpO2 99%   BMI 24.60 kg/m  Filed Weights   07/21/20 1329 07/22/20 1110  Weight: 176 lb 5.9 oz (80 kg) 176 lb 5.9 oz (80 kg)    Estimated body mass index is 24.6 kg/m as calculated from the following:   Height as of this encounter: 5\' 11"  (1.803 m).   Weight as of this encounter: 176 lb 5.9 oz (80 kg).  LABS: CBC:    Component Value Date/Time   WBC 19.6 (H) 07/22/2020 0400   HGB 9.1 (L) 07/22/2020 0400   HGB 12.9 (L) 06/17/2020 1316   HCT 27.5 (L) 07/22/2020 0400   HCT 38.4 06/17/2020 1316   PLT 129 (L) 07/22/2020 0400   PLT 174 06/17/2020 1316   MCV 88.7 07/22/2020 0400   MCV 92 06/17/2020 1316   NEUTROABS 15.5 (H) 07/22/2020 0400   NEUTROABS 6.7 06/17/2020 1316   LYMPHSABS 1.3 07/22/2020 0400   LYMPHSABS 0.9 06/17/2020 1316   MONOABS 1.2 (H) 07/22/2020 0400   EOSABS 0.0 07/22/2020 0400   EOSABS 0.2 06/17/2020 1316   BASOSABS 0.1 07/22/2020 0400   BASOSABS 0.0 06/17/2020 1316   Comprehensive Metabolic Panel:    Component Value Date/Time   NA 135 07/21/2020 1333   NA 138 06/17/2020 1316   K 3.8 07/21/2020 1333   CL 101 07/21/2020 1333   CO2 22 07/21/2020 1333   BUN 17 07/21/2020 1333   BUN 51 (H) 06/17/2020 1316   CREATININE 0.90 07/21/2020 1333   GLUCOSE 125 (H) 07/21/2020 1333   CALCIUM 8.9 07/21/2020 1333   AST 36 07/06/2020 0809   ALT 39 07/06/2020 0809   ALKPHOS 56 07/06/2020 0809   BILITOT 0.5 07/06/2020 0809   BILITOT 0.3 06/17/2020 1316   PROT 6.5 07/06/2020 0809   PROT 6.2 06/17/2020 1316    ALBUMIN 2.7 (L) 07/06/2020 0809   ALBUMIN 3.4 (L) 06/17/2020 1316    RADIOGRAPHIC STUDIES:  DG Chest 2 View  Result Date: 07/21/2020 CLINICAL DATA:  Pleural effusion, PleurX catheter in place. EXAM: CHEST - 2 VIEW COMPARISON:  July 13, 2020. FINDINGS: Similar appearance of a small partially loculated left pleural effusion with overlying airspace opacities. Similar more subtle opacities in the left upper lobe. Similar position of a left PleurX catheter. The right lung is clear. No visible pneumothorax. Similar cardiomediastinal silhouette, partially obscured. Left subclavian approach Port-A-Cath in similar position. No acute osseous abnormality. IMPRESSION: 1. Similar appearance of a small partially loculated left pleural effusion with overlying airspace opacities. Similar position of a left PleurX catheter. 2. Similar more subtle opacities in the left upper lobe. Electronically Signed   By: Margaretha Sheffield MD   On: 07/21/2020 14:26   DG Chest 2 View  Result Date: 07/21/2020 CLINICAL DATA:  Shortness of breath. EXAM: CHEST - 2 VIEW COMPARISON:  July 20, 2020. FINDINGS: Similar appearance of a small partially loculated left pleural effusion with overlying airspace opacities. Similar more subtle opacities in the left upper lobe. Similar position of a left PleurX catheter. The right lung is clear. No visible pneumothorax. Similar cardiomediastinal silhouette, partially obscured. Left subclavian approach Port-A-Cath in similar position. No acute osseous abnormality. IMPRESSION: 1. Similar appearance of a small partially loculated left pleural effusion with overlying airspace opacities. Similar position of a left PleurX catheter. 2. Similar more subtle opacities in the left upper lobe. Electronically Signed   By: Margaretha Sheffield MD   On: 07/21/2020 14:25   DG Chest 2 View  Result Date: 07/13/2020 CLINICAL DATA:  Pleural effusion EXAM: CHEST - 2 VIEW COMPARISON:  June 29, 2020 FINDINGS: PleurX  catheter again noted on the left with tip directed posteriorly. There is partially loculated left pleural effusion. There is airspace consolidation throughout mid and lower lung regions on the left. The right lung is clear. Heart is slightly enlarged with pulmonary vascularity normal. No adenopathy. No pneumothorax. There is bullous disease in the extreme right apex. No bone lesions. Port-A-Cath tip in superior vena cava. IMPRESSION: PleurX catheter present. Left pleural effusion appears similar on the left, partially loculated. Airspace opacity throughout the mid and lower lung regions. Slightly less opacity in the left upper lobe compared to most recent study. Right lung essentially clear. Stable cardiac silhouette. Electronically Signed   By: Lowella Grip III M.D.   On: 07/13/2020 10:10   CT ANGIO CHEST PE W OR WO CONTRAST  Result Date: 07/22/2020 CLINICAL DATA:  Shortness of breath, lung cancer EXAM: CT ANGIOGRAPHY CHEST WITH CONTRAST TECHNIQUE: Multidetector CT imaging of the chest was performed using the standard protocol during bolus administration of intravenous contrast. Multiplanar CT image reconstructions and MIPs were obtained to evaluate the vascular anatomy. CONTRAST:  40mL OMNIPAQUE IOHEXOL 350 MG/ML SOLN COMPARISON:  06/05/2020 FINDINGS: Cardiovascular: Filling defects seen within multiple right pulmonary arteries in all lobes of the right lung compatible with pulmonary emboli. No definite pulmonary emboli on the left. RV: LV ratio is mildly elevated at 1.0. Heart is mildly enlarged. 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 Coronary artery and aortic calcifications. Mediastinum/Nodes: Enlarging prevascular lymph nodes since prior study, measuring up to 10 mm currently, not measurable on prior study. Subcarinal adenopathy has increased, measuring 2 cm in short axis diameter compared to 1.5 cm previously. Abnormal left hilar lymph nodes, increasing since prior study. Lungs/Pleura:  Small loculated pleural effusion again noted, similar prior study with PleurX catheter in place. Pleural soft tissue masses are noted throughout the  left pleural space. Moderate emphysema. No confluent opacity or effusion on the right. Masslike area of airspace disease/consolidation sound in the left lower lobe, encases the left lower lobe pulmonary arteries and measures 6.6 x 5.6 cm. Upper Abdomen: Bulky mass anteriorly in the left cardiophrenic region and extending between to left ribs has enlarged, measuring 4.9 x 3.9 cm compared to 4.4 x 3.2 cm previously. Small hypodensities throughout the liver compatible with cysts. Gastrohepatic ligament adenopathy worsened with short axis diameter measuring up 2 1.5 cm compared with 1.2 cm previously. Musculoskeletal: Left Port-A-Cath in place. No acute bony abnormality. Review of the MIP images confirms the above findings. IMPRESSION: Pulmonary emboli seen within all lobes of the right lung. Slightly elevated RV: LV ratio compatible with right heart strain. Worsening masslike consolidation in the left lower lobe. Pleural nodularity noted throughout the left pleural space with small loculated left pleural effusion. Worsening mediastinal and upper abdominal adenopathy. Worsening left anterior cardiophrenic/abdominal wall mass which extends between 2 of the left ribs. Coronary artery disease. Aortic Atherosclerosis (ICD10-I70.0) and Emphysema (ICD10-J43.9). These results were called by telephone at the time of interpretation on 07/22/2020 at 1:05 am to provider Rufina Falco , who verbally acknowledged these results. Electronically Signed   By: Rolm Baptise M.D.   On: 07/22/2020 01:06   MR BRAIN W WO CONTRAST  Result Date: 06/29/2020 CLINICAL DATA:  Initial evaluation for small cell lung cancer, staging. EXAM: MRI HEAD WITHOUT AND WITH CONTRAST TECHNIQUE: Multiplanar, multiecho pulse sequences of the brain and surrounding structures were obtained without and with  intravenous contrast. CONTRAST:  7.83mL GADAVIST GADOBUTROL 1 MMOL/ML IV SOLN COMPARISON:  Prior MRI from 03/04/2013. FINDINGS: Brain: Examination degraded by motion artifact. Age-related cerebral atrophy with mild-to-moderate chronic microvascular ischemic disease. No acute intracranial infarct. Gray-white matter differentiation maintained. No encephalomalacia to suggest chronic cortical infarction. No foci of susceptibility artifact to suggest acute or chronic intracranial hemorrhage. No mass lesion, midline shift or mass effect. No hydrocephalus or extra-axial fluid collection. Pituitary gland suprasellar region normal. Midline structures intact. No abnormal enhancement or findings to suggest intracranial metastatic disease. Vascular: Major intracranial vascular flow voids are maintained. Skull and upper cervical spine: Craniocervical junction within normal limits. No focal marrow replacing lesion. Multilevel degenerative spondylolysis noted within the upper cervical spine. No scalp soft tissue abnormality. Sinuses/Orbits: Globes and orbital soft tissues demonstrate no acute finding. Paranasal sinuses are largely clear. No mastoid effusion. Inner ear structures grossly normal. Other: None. IMPRESSION: 1. No evidence for intracranial metastatic disease. 2. No other acute intracranial abnormality. 3. Mild to moderate chronic microvascular ischemic disease. Electronically Signed   By: Jeannine Boga M.D.   On: 06/29/2020 03:01   MR THORACIC SPINE W WO CONTRAST  Result Date: 06/29/2020 CLINICAL DATA:  Initial evaluation for non-small cell lung cancer, pretreatment staging. Abnormality T11-12. EXAM: MRI THORACIC WITHOUT AND WITH CONTRAST TECHNIQUE: Multiplanar and multiecho pulse sequences of the thoracic spine were obtained without and with intravenous contrast. CONTRAST:  7.65mL GADAVIST GADOBUTROL 1 MMOL/ML IV SOLN COMPARISON:  Prior PET-CT from 06/12/2020. FINDINGS: MRI THORACIC SPINE FINDINGS Alignment:  Vertebral bodies normally aligned with preservation of the normal thoracic kyphosis. No listhesis. Vertebrae: Signal intensity within the visualized bone marrow is diffusely heterogeneous. A left paraspinous soft tissue mass is seen along the left anterolateral aspect of the vertebral column extending from approximately T9-10 through T12-L1 (series 20, image 18). Evidence for invasion/involvement of the adjacent T10 and T11 vertebral bodies (series 21, image 14). Tumor partially extends  into the left neural foramen at T11-12 (series 40, image 34). No other significant extension into the spinal canal or epidural tumor. A separate 13 mm metastasis seen involving the central/right aspect of the T10 vertebral body (series 39, image 8). Probable additional 13 mm metastatic lesion seen at T1 (series 20, image 9). 1 cm lesion seen within the right pedicle of T6 (series 21, image 6). No associated pathologic fracture. Cord:  Normal signal and morphology.  No significant epidural tumor. Paraspinal and other soft tissues: Paraspinous tumor as above. Paraspinous soft tissues demonstrate no other acute finding. Irregular and partially loculated left pleural effusion with scattered fluid-fluid levels partially visualized at the left lung base. Consolidative changes noted throughout the posterior left lung superiorly. Disc levels: Normal expected multilevel degenerative disc desiccation seen within the thoracic spine. Mild disc bulging noted at the T7-8 level without significant spinal stenosis. Multilevel facet hypertrophy throughout the lower thoracic spine. No significant stenosis or foraminal encroachment. IMPRESSION: 1. 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 Tumor partially extends into the left neural foramen at T11-12. No other significant extension into the spinal canal or epidural tumor. 2. Additional metastatic  lesions involving the T1 and T10 vertebral bodies as well as the right pedicle of T6. No associated pathologic fracture. 3. Heterogeneous and partially loculated left pleural effusion with consolidation signal intensity throughout the visualized left lower lung. Electronically Signed   By: Jeannine Boga M.D.   On: 06/29/2020 03:18   PERIPHERAL VASCULAR CATHETERIZATION  Result Date: 07/22/2020 See op note  US Venous Img Lower Bilateral (DVT)  Result Date: 07/21/2020 CLINICAL DATA:  Lower extremity edema EXAM: BILATERAL LOWER EXTREMITY VENOUS DOPPLER ULTRASOUND TECHNIQUE: Gray-scale sonography with graded compression, as well as color Doppler and duplex ultrasound were performed to evaluate the lower extremity deep venous systems from the level of the common femoral vein and including the common femoral, femoral, profunda femoral, popliteal and calf veins including the posterior tibial, peroneal and gastrocnemius veins when visible. The superficial great saphenous vein was also interrogated. Spectral Doppler was utilized to evaluate flow at rest and with distal augmentation maneuvers in the common femoral, femoral and popliteal veins. COMPARISON:  None. FINDINGS: RIGHT LOWER EXTREMITY Common Femoral Vein: No evidence of thrombus. Normal compressibility, respiratory phasicity and response to augmentation. Saphenofemoral Junction: No evidence of thrombus. Normal compressibility and flow on color Doppler imaging. Profunda Femoral Vein: No evidence of thrombus. Normal compressibility and flow on color Doppler imaging. Femoral Vein: No evidence of thrombus. Normal compressibility, respiratory phasicity and response to augmentation. Popliteal Vein: Thrombus is noted with decreased compressibility. Calf Veins: Thrombus is noted within the posterior tibial and peroneal veins with decreased compressibility. Superficial Great Saphenous Vein: No evidence of thrombus. Normal compressibility. Venous Reflux:  None.  Other Findings:  None. LEFT LOWER EXTREMITY Common Femoral Vein: No evidence of thrombus. Normal compressibility, respiratory phasicity and response to augmentation. Saphenofemoral Junction: No evidence of thrombus. Normal compressibility and flow on color Doppler imaging. Profunda Femoral Vein: No evidence of thrombus. Normal compressibility and flow on color Doppler imaging. Femoral Vein: No evidence of thrombus. Normal compressibility, respiratory phasicity and response to augmentation. Popliteal Vein: Thrombus is noted with decreased compressibility. Calf Veins: Peroneal vein demonstrates thrombus with decreased compressibility. Superficial Great Saphenous Vein: No evidence of thrombus. Normal compressibility. Venous Reflux:  None. Other Findings:  None. IMPRESSION: Bilateral deep venous thrombosis involving the popliteal and calf veins. Electronically  Signed   By: Inez Catalina M.D.   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 ANESTHESIA/SEDATION: Moderate (conscious) sedation was employed during this procedure. A total of Versed 0.5 mg and Fentanyl 25 mcg was administered intravenously. Moderate Sedation Time: 10 minutes. The patient's level of consciousness and vital signs were monitored continuously by radiology nursing throughout the procedure under my direct supervision. FLUOROSCOPY TIME:  CT COMPLICATIONS: None PROCEDURE: The procedure, risks, benefits, and alternatives were explained to the patient and the patient's family. Specific risks that were addressed included bleeding, infection, pneumothorax, need for further procedure including chest tube placement, chance of delayed pneumothorax or hemorrhage, hemoptysis, nondiagnostic sample, cardiopulmonary collapse, death. Questions regarding the procedure were encouraged  and answered. The patient understands and consents to the procedure. Patient was positioned in the supine position on the CT gantry table and a scout CT of the chest was performed for planning purposes. Once angle of approach was determined, the skin and subcutaneous tissues this scan was prepped and draped in the usual sterile fashion, and a sterile drape was applied covering the operative field. A sterile gown and sterile gloves were used for the procedure. Local anesthesia was provided with 1% Lidocaine. The skin and subcutaneous tissues were infiltrated 1% lidocaine for local anesthesia, and a small stab incision was made with an 11 blade scalpel. Using CT guidance, a 17 gauge trocar needle was advanced into the left lower anterior chest walltarget. After confirmation of the tip, separate 18 gauge core biopsies were performed. These were placed into solution for transportation to the lab. Final CT was acquired Patient tolerated the procedure well and remained hemodynamically stable throughout. No complications were encountered and no significant blood loss was encounter IMPRESSION: Status post CT-guided biopsy of left chest wall mass, FDG avid on prior PET. Signed, Dulcy Fanny. Dellia Nims, RPVI [facility] Electronically Signed   By: Corrie Mckusick D.O.   On: 06/24/2020 12:20   DG Chest Port 1 View  Result Date: 06/29/2020 CLINICAL DATA:  Status post Port-A-Cath placement. EXAM: PORTABLE CHEST 1 VIEW COMPARISON:  June 25, 2020. FINDINGS: Stable cardiomegaly. Interval placement of left subclavian Port-A-Cath with distal tip in expected position of cavoatrial junction. No pneumothorax pleural effusion is noted. Right lung is clear. Left pleural effusion is significantly smaller status post pleural drainage catheter placement. Diffuse left lung opacity is noted concerning for atelectasis or possibly inflammation. Bony thorax is unremarkable. IMPRESSION:  Interval placement of left subclavian Port-A-Cath with distal tip in expected position of cavoatrial junction. Left pleural effusion is significantly smaller status post pleural drainage catheter placement. Electronically Signed   By: Marijo Conception M.D.   On: 06/29/2020 13:30   DG Chest Port 1 View  Result Date: 06/25/2020 CLINICAL DATA:  Chest tube. EXAM: PORTABLE CHEST 1 VIEW COMPARISON:  06/24/2020. FINDINGS: Left chest tube in stable position. No pneumothorax. Prominent skin folds again noted on the right. Persistent left base atelectasis/infiltrate. Small left pleural effusion. No pneumothorax. Cardiomegaly. Degenerative changes scoliosis thoracic spine. IMPRESSION: 1.  Left chest tube in stable position. No pneumothorax. 2. Persistent left base atelectasis/infiltrate. Small left pleural effusion. Similar findings on prior exam. Electronically Signed   By: [place]   On: 06/25/2020 05:01   DG Chest Port 1 View  Result Date: 06/24/2020  CLINICAL DATA:  Chest tube. EXAM: PORTABLE CHEST 1 VIEW COMPARISON:  June 22, 2020. FINDINGS: Stable cardiomegaly. Stable position of left-sided chest tube. Moderate left pleural effusion is noted which is significantly enlarged compared to prior exam. Atelectasis or infiltrate is noted in the remaining portion of the aerated left lung. Right lung is clear. No pneumothorax is noted. Bony thorax is unremarkable. IMPRESSION: Stable position of left-sided chest tube. Moderate left pleural effusion is noted which is significantly enlarged compared to prior exam. Atelectasis or infiltrate is noted in the remaining portion of the aerated left lung. Electronically Signed   By: Marijo Conception M.D.   On: 06/24/2020 08:43   DG C-Arm 1-60 Min-No Report  Result Date: 06/29/2020 Fluoroscopy was utilized by the requesting physician.  No radiographic interpretation.   ECHOCARDIOGRAM COMPLETE  Result Date: 07/22/2020    ECHOCARDIOGRAM REPORT   Patient Name:   Dwayne Thompson Date of Exam: 07/22/2020 Medical Rec #:  [id number]    Height:       71.0 in Accession #:    [id number]   Weight:       176.4 lb Date of Birth:  Jul 31, 1946     BSA:          1.999 m Patient Age:    24 years     BP:           106/67 mmHg Patient Gender: M            HR:           104 bpm. Exam Location:  [facility] Procedure: 2D Echo, Cardiac Doppler and Color Doppler Indications:     Shortness of breath, DVT  History:         Patient has no prior history of Echocardiogram examinations.                  COPD; Risk Factors:Hypertension and Dyslipidemia.  Sonographer:     Sherrie Sport RDCS (AE) Referring Phys:  Katy Diagnosing Phys: Kate Sable MD IMPRESSIONS  1. Left ventricular ejection fraction, by estimation, is 65 to 70%. The left ventricle has normal function. The left ventricle has no regional wall motion abnormalities. Left ventricular diastolic parameters are consistent with Grade I diastolic dysfunction (impaired relaxation).  2. Right ventricular systolic function is normal. The right ventricular size is normal. There is moderately elevated pulmonary artery systolic pressure.  3. The mitral valve is normal in structure. No evidence of mitral valve regurgitation.  4. The aortic valve is normal in structure. Aortic valve regurgitation is not visualized.  5. The inferior vena cava is normal in size with greater than 50% respiratory variability, suggesting right atrial pressure of 3 mmHg. FINDINGS  Left Ventricle: Left ventricular ejection fraction, by estimation, is 65 to 70%. The left ventricle has normal function. The left ventricle has no regional wall motion abnormalities. The left ventricular internal cavity size was normal in size. There is  borderline left ventricular hypertrophy. Left ventricular diastolic parameters are consistent with Grade I diastolic dysfunction (impaired relaxation). Right Ventricle: The right ventricular size is normal. No increase in right ventricular wall  thickness. Right ventricular systolic function is normal. There is moderately elevated pulmonary artery systolic pressure. The tricuspid regurgitant velocity is 3.49 m/s, and with an assumed right atrial pressure of 3 mmHg, the estimated right ventricular systolic pressure is 27.2 mmHg. Left Atrium: Left atrial size was normal in size. Right Atrium: Right atrial size was normal in  size. Pericardium: There is no evidence of pericardial effusion. Mitral Valve: The mitral valve is normal in structure. No evidence of mitral valve regurgitation. Tricuspid Valve: The tricuspid valve is normal in structure. Tricuspid valve regurgitation is not demonstrated. Aortic Valve: The aortic valve is normal in structure. Aortic valve regurgitation is not visualized. Aortic valve mean gradient measures 4.0 mmHg. Aortic valve peak gradient measures 7.8 mmHg. Aortic valve area, by VTI measures 3.46 cm. Pulmonic Valve: The pulmonic valve was not well visualized. Pulmonic valve regurgitation is not visualized. Aorta: The aortic root is normal in size and structure. Venous: The inferior vena cava is normal in size with greater than 50% respiratory variability, suggesting right atrial pressure of 3 mmHg. IAS/Shunts: No atrial level shunt detected by color flow Doppler.  LEFT VENTRICLE PLAX 2D LVIDd:         3.73 cm  Diastology LVIDs:         2.66 cm  LV e' medial:    3.59 cm/s LV PW:         1.08 cm  LV E/e' medial:  25.4 LV IVS:        1.08 cm  LV e' lateral:   7.83 cm/s LVOT diam:     2.10 cm  LV E/e' lateral: 11.6 LV SV:         77 LV SV Index:   38 LVOT Area:     3.46 cm  RIGHT VENTRICLE RV Basal diam:  3.19 cm RV S prime:     14.90 cm/s TAPSE (M-mode): 3.6 cm LEFT ATRIUM             Index       RIGHT ATRIUM           Index LA diam:        3.20 cm 1.60 cm/m  RA Area:     18.40 cm LA Vol (A2C):   60.9 ml 30.47 ml/m RA Volume:   51.00 ml  25.51 ml/m LA Vol (A4C):   41.9 ml 20.96 ml/m LA Biplane Vol: 53.3 ml 26.67 ml/m  AORTIC  VALVE                   PULMONIC VALVE AV Area (Vmax):    2.63 cm    PV Vmax:        0.34 m/s AV Area (Vmean):   2.74 cm    PV Peak grad:   0.4 mmHg AV Area (VTI):     3.46 cm    RVOT Peak grad: 2 mmHg AV Vmax:           139.50 cm/s AV Vmean:          93.750 cm/s AV VTI:            0.222 m AV Peak Grad:      7.8 mmHg AV Mean Grad:      4.0 mmHg LVOT Vmax:         106.00 cm/s LVOT Vmean:        74.200 cm/s LVOT VTI:          0.222 m LVOT/AV VTI ratio: 1.00  AORTA Ao Root diam: 3.30 cm MITRAL VALVE                TRICUSPID VALVE MV Area (PHT): 3.60 cm     TR Peak grad:   48.7 mmHg MV Decel Time: 211 msec     TR Vmax:  349.00 cm/s MV E velocity: 91.20 cm/s MV A velocity: 139.00 cm/s  SHUNTS MV E/A ratio:  0.66         Systemic VTI:  0.22 m                             Systemic Diam: 2.10 cm Kate Sable MD Electronically signed by Kate Sable MD Signature Date/Time: 07/22/2020/12:42:21 PM    Final     PERFORMANCE STATUS (ECOG) : 2 - Symptomatic, <50% confined to bed  Review of Systems Unless otherwise noted, a complete review of systems is negative.  Physical Exam General: NAD, thin, frail-appearing Pulmonary: Unlabored Extremities: no edema, no joint deformities Skin: no rashes Neurological: Weakness but otherwise nonfocal  IMPRESSION: Patient seen after he returned from his thrombectomy.  He is stable appearing with flat affect.  He denies any distressing symptoms at present other than an mild chronic back pain (currently rates as 6 out of 10).  He has been given oxycodone for that by RN.  I attempted to discuss goals with him but he requested that his wife be present.  I called and spoke with his wife who is presently in route to the hospital.  Will meet with patient when wife arrives.   PLAN: -Continue current scope of treatment -Will meet with patient/wife to discuss goals   Time Total: 30 minutes  Visit consisted of counseling and education dealing with the complex  and emotionally intense issues of symptom management and palliative care in the setting of serious and potentially life-threatening illness.Greater than 50%  of this time was spent counseling and coordinating care related to the above assessment and plan.  Signed by: [name], PhD, NP-C

## 2020-07-23 ENCOUNTER — Encounter: Payer: Self-pay | Admitting: Vascular Surgery

## 2020-07-23 ENCOUNTER — Encounter: Payer: Self-pay | Admitting: Cardiothoracic Surgery

## 2020-07-23 DIAGNOSIS — J9621 Acute and chronic respiratory failure with hypoxia: Secondary | ICD-10-CM | POA: Diagnosis not present

## 2020-07-23 DIAGNOSIS — E43 Unspecified severe protein-calorie malnutrition: Secondary | ICD-10-CM | POA: Insufficient documentation

## 2020-07-23 LAB — BASIC METABOLIC PANEL
Anion gap: 8 (ref 5–15)
BUN: 13 mg/dL (ref 8–23)
CO2: 23 mmol/L (ref 22–32)
Calcium: 8.2 mg/dL — ABNORMAL LOW (ref 8.9–10.3)
Chloride: 105 mmol/L (ref 98–111)
Creatinine, Ser: 0.75 mg/dL (ref 0.61–1.24)
GFR, Estimated: >60 mL/min (ref >60)
Glucose, Bld: 106 mg/dL — ABNORMAL HIGH (ref 70–99)
Potassium: 4 mmol/L (ref 3.5–5.1)
Sodium: 136 mmol/L (ref 135–145)

## 2020-07-23 LAB — CBC
HCT: 23.5 % — ABNORMAL LOW (ref 39.0–52.0)
Hemoglobin: 7.8 g/dL — ABNORMAL LOW (ref 13.0–17.0)
MCH: 29.4 pg (ref 26.0–34.0)
MCHC: 33.2 g/dL (ref 30.0–36.0)
MCV: 88.7 fL (ref 80.0–100.0)
Platelets: 125 10*3/uL — ABNORMAL LOW (ref 150–400)
RBC: 2.65 MIL/uL — ABNORMAL LOW (ref 4.22–5.81)
RDW: 14.6 % (ref 11.5–15.5)
WBC: 23.2 10*3/uL — ABNORMAL HIGH (ref 4.0–10.5)
nRBC: 0 % (ref 0.0–0.2)

## 2020-07-23 LAB — HEPARIN LEVEL (UNFRACTIONATED)
Heparin Unfractionated: 0.43 IU/mL (ref 0.30–0.70)
Heparin Unfractionated: 0.64 IU/mL (ref 0.30–0.70)

## 2020-07-23 LAB — MRSA PCR SCREENING: MRSA by PCR: NEGATIVE

## 2020-07-23 LAB — PROCALCITONIN: Procalcitonin: 0.26 ng/mL

## 2020-07-23 MED ORDER — IPRATROPIUM-ALBUTEROL 0.5-2.5 (3) MG/3ML IN SOLN
3.0000 mL | Freq: Three times a day (TID) | RESPIRATORY_TRACT | Status: DC
  Administered 2020-07-23: 3 mL via RESPIRATORY_TRACT
  Filled 2020-07-23: qty 3

## 2020-07-23 MED ORDER — ACETAMINOPHEN 325 MG PO TABS: 650.0000 mg | ORAL_TABLET | Freq: Four times a day (QID) | ORAL | Status: DC | PRN

## 2020-07-23 MED ORDER — ADULT MULTIVITAMIN W/MINERALS CH
1.0000 | ORAL_TABLET | Freq: Every day | ORAL | Status: DC
  Administered 2020-07-24 – 2020-07-25 (×2): 1 via ORAL
  Filled 2020-07-23 (×2): qty 1

## 2020-07-23 MED ORDER — VANCOMYCIN HCL 750 MG/150ML IV SOLN
750.0000 mg | Freq: Three times a day (TID) | INTRAVENOUS | Status: DC
  Filled 2020-07-23: qty 150

## 2020-07-23 MED ORDER — ENSURE ENLIVE PO LIQD
237.0000 mL | Freq: Three times a day (TID) | ORAL | Status: DC
  Administered 2020-07-23 – 2020-07-25 (×6): 237 mL via ORAL

## 2020-07-23 MED ORDER — VANCOMYCIN HCL 2000 MG/400ML IV SOLN
2000.0000 mg | Freq: Once | INTRAVENOUS | Status: AC
  Administered 2020-07-23: 2000 mg via INTRAVENOUS
  Filled 2020-07-23: qty 400

## 2020-07-23 MED ORDER — PIPERACILLIN-TAZOBACTAM 3.375 G IVPB
3.3750 g | Freq: Three times a day (TID) | INTRAVENOUS | Status: DC
  Administered 2020-07-23 – 2020-07-25 (×6): 3.375 g via INTRAVENOUS
  Filled 2020-07-23 (×6): qty 50

## 2020-07-23 MED ORDER — IPRATROPIUM-ALBUTEROL 0.5-2.5 (3) MG/3ML IN SOLN
3.0000 mL | Freq: Three times a day (TID) | RESPIRATORY_TRACT | Status: DC
  Administered 2020-07-23 – 2020-07-24 (×3): 3 mL via RESPIRATORY_TRACT
  Filled 2020-07-23 (×3): qty 3

## 2020-07-23 NOTE — Progress Notes (Signed)
PROGRESS NOTE    JC VERON  TFT:[id number] DOB: Feb 08, 1946 DOA: 07/21/2020 PCP: Jerrol Banana., MD     Brief Narrative:  Dwayne Thompson is a 74 y.o. male with medical history significant of COPD, HTN, cirrhosis, small cell lung cancer diagnosed October 2021. Pt just had one round of chemo therapy. Per reports, SOB worsening for past few days and is only on prn oxygen.  In the emergency department, work-up revealed right-sided pulmonary embolism with bilateral lower extremity DVT.  Vascular surgery was consulted for pulmonary thrombectomy and oncology was consulted as well.  Patient underwent mechanical thrombectomy of the right pulmonary arteries by vascular surgery Dr. Lucky Cowboy on 11/3.  New events last 24 hours / Subjective: Patient sitting at the side of bed, states that he feels short of breath.  I noticed that his oxygen tubing was not hooked up to the wall.  This was replaced to 4 L nasal cannula O2.  He did not complain of any chest pain.  Noticed decrease in hemoglobin overnight.  Patient denies any source of bleeding, has not had a bowel movement.  Assessment & Plan:   Principal Problem:   Acute on chronic respiratory failure with hypoxia (HCC) Active Problems:   BPH with obstruction/lower urinary tract symptoms   Small cell lung cancer (HCC)   COPD (chronic obstructive pulmonary disease) (HCC)   Pulmonary embolism (HCC)   Palliative care encounter   Pulmonary embolism, bilateral lower extremity DVT -S/p right pulmonary artery thrombectomy by Dr. Lucky Cowboy 11/3 -Echocardiogram EF 65-70%, grade 1 dd -Continue IV heparin -Monitor hemoglobin  Extensive small cell lung cancer -Chronic Pleurx catheter in place -Oncology following -Palliative care following for goals of care conversation   Severe sepsis secondary to postobstructive pneumonia of the left lower lobe, present on admission -Presented with leukocytosis WBC 18, tachycardia, tachypnea with respiratory failure -Continue  Rocephin, azithromycin --> Vanco, zosyn  -WBC increased to 23.2 with increase in procalcitonin.  Broaden antibiotics.  MRSA PCR ordered (if negative, discontinue vanco)   Acute on chronic hypoxemic respiratory failure  -Secondary to PE, SCLC, post-obstructive pneumonia -Currently on 4L [place] O2 (was on 2L qhs prior to admission)   BPH -Continue dutasteride   Chronic hypotension -Continue Midodrine  Hyperlipidemia -Continue Crestor  In agreement with assessment of the pressure ulcer as below:  Pressure Injury 06/23/20 Buttocks Right Stage 2 -  Partial thickness loss of dermis presenting as a shallow open injury with a red, pink wound bed without slough. 2.5 x 1.2 (Active)  06/23/20 0608  Location: Buttocks  Location Orientation: Right  Staging: Stage 2 -  Partial thickness loss of dermis presenting as a shallow open injury with a red, pink wound bed without slough.  Wound Description (Comments): 2.5 x 1.2  Present on Admission: Yes         DVT prophylaxis: IV heparin SCDs Start: 07/21/20 1528  Code Status: DNR Family Communication: No family at bedside, note palliative care medicine planning for family meeting Disposition Plan:  Status is: Inpatient  Remains inpatient appropriate because:IV treatments appropriate due to intensity of illness or inability to take PO and Inpatient level of care appropriate due to severity of illness   Dispo: The patient is from: Home              Anticipated d/c is to: Home              Anticipated d/c date is: 3 days  Patient currently is not medically stable to d/c.  Remains on IV heparin, IV antibiotics.     Consultants:   Vascular surgery  Oncology  Procedures:   Pulmonary thrombectomy 11/3  Antimicrobials:  Anti-infectives (From admission, onward)   Start     Dose/Rate Route Frequency Ordered Stop   07/21/20 2000  azithromycin (ZITHROMAX) 500 mg in sodium chloride 0.9 % 250 mL IVPB        500 mg 250 mL/hr over  60 Minutes Intravenous Every 24 hours 07/21/20 1931     07/21/20 1945  cefTRIAXone (ROCEPHIN) 1 g in sodium chloride 0.9 % 100 mL IVPB        1 g 200 mL/hr over 30 Minutes Intravenous Every 24 hours 07/21/20 1931 07/26/20 1944       Objective: Vitals:   07/23/20 0154 07/23/20 0402 07/23/20 0803 07/23/20 0835  BP:  (!) 94/57 99/63   Pulse:  85 89 87  Resp:  18 16 16   Temp:  98.4 F (36.9 C) 98 F (36.7 C)   TempSrc:  Oral Oral   SpO2:  100% 100% 96%  Weight: 79.8 kg     Height:        Intake/Output Summary (Last 24 hours) at 07/23/2020 0955 Last data filed at 07/23/2020 0546 Gross per 24 hour  Intake 1273.42 ml  Output 350 ml  Net 923.42 ml   Filed Weights   07/21/20 1329 07/22/20 1110 07/23/20 0154  Weight: 80 kg 80 kg 79.8 kg    Examination: General exam: Appears calm and comfortable  Respiratory system: Without respiratory distress.  Diminished breath sounds on left Cardiovascular system: S1 & S2 heard, RRR. No pedal edema. Gastrointestinal system: Abdomen is nondistended, soft and nontender. Normal bowel sounds heard. Central nervous system: Alert and oriented. Non focal exam. Speech clear  Extremities: Symmetric in appearance bilaterally  Skin: No rashes, lesions or ulcers on exposed skin  Psychiatry: Judgement and insight appear stable. Mood & affect appropriate.    Data Reviewed: I have personally reviewed following labs and imaging studies  CBC: Recent Labs  Lab 07/21/20 1333 07/22/20 0400 07/23/20 0500  WBC 18.0* 19.6* 23.2*  NEUTROABS  --  15.5*  --   HGB 10.2* 9.1* 7.8*  HCT 30.6* 27.5* 23.5*  MCV 88.2 88.7 88.7  PLT 133* 129* 254*   Basic Metabolic Panel: Recent Labs  Lab 07/21/20 1333 07/23/20 0500  NA 135 136  K 3.8 4.0  CL 101 105  CO2 22 23  GLUCOSE 125* 106*  BUN 17 13  CREATININE 0.90 0.75  CALCIUM 8.9 8.2*   GFR: Estimated Creatinine Clearance: 86.3 mL/min (by C-G formula based on SCr of 0.75 mg/dL). Liver Function  Tests: No results for input(s): AST, ALT, ALKPHOS, BILITOT, PROT, ALBUMIN in the last 168 hours. No results for input(s): LIPASE, AMYLASE in the last 168 hours. No results for input(s): AMMONIA in the last 168 hours. Coagulation Profile: Recent Labs  Lab 07/22/20 0400  INR 1.1   Cardiac Enzymes: No results for input(s): CKTOTAL, CKMB, CKMBINDEX, TROPONINI in the last 168 hours. BNP (last 3 results) No results for input(s): PROBNP in the last 8760 hours. HbA1C: No results for input(s): HGBA1C in the last 72 hours. CBG: No results for input(s): GLUCAP in the last 168 hours. Lipid Profile: No results for input(s): CHOL, HDL, LDLCALC, TRIG, CHOLHDL, LDLDIRECT in the last 72 hours. Thyroid Function Tests: No results for input(s): TSH, T4TOTAL, FREET4, T3FREE, THYROIDAB in the last 72 hours.  Anemia Panel: No results for input(s): VITAMINB12, FOLATE, FERRITIN, TIBC, IRON, RETICCTPCT in the last 72 hours. Sepsis Labs: Recent Labs  Lab 07/21/20 1818 07/21/20 2349 07/22/20 0400 07/23/20 0500  PROCALCITON  --  <0.10 0.19 0.26  LATICACIDVEN 2.4*  --   --   --     Recent Results (from the past 240 hour(s))  Respiratory Panel by RT PCR (Flu A&B, Covid) - Nasopharyngeal Swab     Status: None   Collection Time: 07/21/20  4:12 PM   Specimen: Nasopharyngeal Swab  Result Value Ref Range Status   SARS Coronavirus 2 by RT PCR NEGATIVE NEGATIVE Final    Comment: (NOTE) SARS-CoV-2 target nucleic acids are NOT DETECTED.  The SARS-CoV-2 RNA is generally detectable in upper respiratoy specimens during the acute phase of infection. The lowest concentration of SARS-CoV-2 viral copies this assay can detect is 131 copies/mL. A negative result does not preclude SARS-Cov-2 infection and should not be used as the sole basis for treatment or other patient management decisions. A negative result may occur with  improper specimen collection/handling, submission of specimen other than nasopharyngeal  swab, presence of viral mutation(s) within the areas targeted by this assay, and inadequate number of viral copies (<131 copies/mL). A negative result must be combined with clinical observations, patient history, and epidemiological information. The expected result is Negative.  Fact Sheet for Patients:  PinkCheek.be  Fact Sheet for Healthcare Providers:  GravelBags.it  This test is no t yet approved or cleared by the [place] FDA and  has been authorized for detection and/or diagnosis of SARS-CoV-2 by FDA under an Emergency Use Authorization (EUA). This EUA will remain  in effect (meaning this test can be used) for the duration of the COVID-19 declaration under Section 564(b)(1) of the Act, 21 U.S.C. section 360bbb-3(b)(1), unless the authorization is terminated or revoked sooner.     Influenza A by PCR NEGATIVE NEGATIVE Final   Influenza B by PCR NEGATIVE NEGATIVE Final    Comment: (NOTE) The Xpert Xpress SARS-CoV-2/FLU/RSV assay is intended as an aid in  the diagnosis of influenza from Nasopharyngeal swab specimens and  should not be used as a sole basis for treatment. Nasal washings and  aspirates are unacceptable for Xpert Xpress SARS-CoV-2/FLU/RSV  testing.  Fact Sheet for Patients: PinkCheek.be  Fact Sheet for Healthcare Providers: GravelBags.it  This test is not yet approved or cleared by the [place] FDA and  has been authorized for detection and/or diagnosis of SARS-CoV-2 by  FDA under an Emergency Use Authorization (EUA). This EUA will remain  in effect (meaning this test can be used) for the duration of the  Covid-19 declaration under Section 564(b)(1) of the Act, 21  U.S.C. section 360bbb-3(b)(1), unless the authorization is  terminated or revoked. Performed at [facility], [address], [place], [place] [address]        Radiology Studies: DG Chest 2 View  Result Date: 07/21/2020 CLINICAL DATA:  Shortness of breath. EXAM: CHEST - 2 VIEW COMPARISON:  July 20, 2020. FINDINGS: Similar appearance of a small partially loculated left pleural effusion with overlying airspace opacities. Similar more subtle opacities in the left upper lobe. Similar position of a left PleurX catheter. The right lung is clear. No visible pneumothorax. Similar cardiomediastinal silhouette, partially obscured. Left subclavian approach Port-A-Cath in similar position. No acute osseous abnormality. IMPRESSION: 1. Similar appearance of a small partially loculated left pleural effusion with overlying airspace opacities. Similar position of a left PleurX catheter. 2.  Similar more subtle opacities in the left upper lobe. Electronically Signed   By: Margaretha Sheffield MD   On: 07/21/2020 14:25   CT ANGIO CHEST PE W OR WO CONTRAST  Result Date: 07/22/2020 CLINICAL DATA:  Shortness of breath, lung cancer EXAM: CT ANGIOGRAPHY CHEST WITH CONTRAST TECHNIQUE: Multidetector CT imaging of the chest was performed using the standard protocol during bolus administration of intravenous contrast. Multiplanar CT image reconstructions and MIPs were obtained to evaluate the vascular anatomy. CONTRAST:  68mL OMNIPAQUE IOHEXOL 350 MG/ML SOLN COMPARISON:  06/05/2020 FINDINGS: Cardiovascular: Filling defects seen within multiple right pulmonary arteries in all lobes of the right lung compatible with pulmonary emboli. No definite pulmonary emboli on the left. RV: LV ratio is mildly elevated at 1.0. Heart is mildly enlarged. 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 Coronary artery and aortic calcifications. Mediastinum/Nodes: Enlarging prevascular lymph nodes since prior study, measuring up to 10 mm currently, not measurable on prior study. Subcarinal adenopathy has increased, measuring 2 cm in short axis diameter compared to 1.5 cm previously. Abnormal left  hilar lymph nodes, increasing since prior study. Lungs/Pleura: Small loculated pleural effusion again noted, similar prior study with PleurX catheter in place. Pleural soft tissue masses are noted throughout the left pleural space. Moderate emphysema. No confluent opacity or effusion on the right. Masslike area of airspace disease/consolidation sound in the left lower lobe, encases the left lower lobe pulmonary arteries and measures 6.6 x 5.6 cm. Upper Abdomen: Bulky mass anteriorly in the left cardiophrenic region and extending between to left ribs has enlarged, measuring 4.9 x 3.9 cm compared to 4.4 x 3.2 cm previously. Small hypodensities throughout the liver compatible with cysts. Gastrohepatic ligament adenopathy worsened with short axis diameter measuring up 2 1.5 cm compared with 1.2 cm previously. Musculoskeletal: Left Port-A-Cath in place. No acute bony abnormality. Review of the MIP images confirms the above findings. IMPRESSION: Pulmonary emboli seen within all lobes of the right lung. Slightly elevated RV: LV ratio compatible with right heart strain. Worsening masslike consolidation in the left lower lobe. Pleural nodularity noted throughout the left pleural space with small loculated left pleural effusion. Worsening mediastinal and upper abdominal adenopathy. Worsening left anterior cardiophrenic/abdominal wall mass which extends between 2 of the left ribs. Coronary artery disease. Aortic Atherosclerosis (ICD10-I70.0) and Emphysema (ICD10-J43.9). These results were called by telephone at the time of interpretation on 07/22/2020 at 1:05 am to provider Rufina Falco , who verbally acknowledged these results. Electronically Signed   By: Rolm Baptise M.D.   On: 07/22/2020 01:06   PERIPHERAL VASCULAR CATHETERIZATION  Result Date: 07/22/2020 See op note  US Venous Img Lower Bilateral (DVT)  Result Date: 07/21/2020 CLINICAL DATA:  Lower extremity edema EXAM: BILATERAL LOWER EXTREMITY VENOUS DOPPLER  ULTRASOUND TECHNIQUE: Gray-scale sonography with graded compression, as well as color Doppler and duplex ultrasound were performed to evaluate the lower extremity deep venous systems from the level of the common femoral vein and including the common femoral, femoral, profunda femoral, popliteal and calf veins including the posterior tibial, peroneal and gastrocnemius veins when visible. The superficial great saphenous vein was also interrogated. Spectral Doppler was utilized to evaluate flow at rest and with distal augmentation maneuvers in the common femoral, femoral and popliteal veins. COMPARISON:  None. FINDINGS: RIGHT LOWER EXTREMITY Common Femoral Vein: No evidence of thrombus. Normal compressibility, respiratory phasicity and response to augmentation. Saphenofemoral Junction: No evidence of thrombus. Normal compressibility and flow on color Doppler imaging. Profunda Femoral Vein: No evidence  of thrombus. Normal compressibility and flow on color Doppler imaging. Femoral Vein: No evidence of thrombus. Normal compressibility, respiratory phasicity and response to augmentation. Popliteal Vein: Thrombus is noted with decreased compressibility. Calf Veins: Thrombus is noted within the posterior tibial and peroneal veins with decreased compressibility. Superficial Great Saphenous Vein: No evidence of thrombus. Normal compressibility. Venous Reflux:  None. Other Findings:  None. LEFT LOWER EXTREMITY Common Femoral Vein: No evidence of thrombus. Normal compressibility, respiratory phasicity and response to augmentation. Saphenofemoral Junction: No evidence of thrombus. Normal compressibility and flow on color Doppler imaging. Profunda Femoral Vein: No evidence of thrombus. Normal compressibility and flow on color Doppler imaging. Femoral Vein: No evidence of thrombus. Normal compressibility, respiratory phasicity and response to augmentation. Popliteal Vein: Thrombus is noted with decreased compressibility. Calf  Veins: Peroneal vein demonstrates thrombus with decreased compressibility. Superficial Great Saphenous Vein: No evidence of thrombus. Normal compressibility. Venous Reflux:  None. Other Findings:  None. IMPRESSION: Bilateral deep venous thrombosis involving the popliteal and calf veins. Electronically Signed   By: Inez Catalina M.D.   On: 07/21/2020 18:06   ECHOCARDIOGRAM COMPLETE  Result Date: 07/22/2020    ECHOCARDIOGRAM REPORT   Patient Name:   Dwayne Thompson Date of Exam: 07/22/2020 Medical Rec #:  [id number]    Height:       71.0 in Accession #:    [id number]   Weight:       176.4 lb Date of Birth:  November 24, 1945     BSA:          1.999 m Patient Age:    35 years     BP:           106/67 mmHg Patient Gender: M            HR:           104 bpm. Exam Location:  [facility] Procedure: 2D Echo, Cardiac Doppler and Color Doppler Indications:     Shortness of breath, DVT  History:         Patient has no prior history of Echocardiogram examinations.                  COPD; Risk Factors:Hypertension and Dyslipidemia.  Sonographer:     Sherrie Sport RDCS (AE) Referring Phys:  [place] Diagnosing Phys: Kate Sable MD IMPRESSIONS  1. Left ventricular ejection fraction, by estimation, is 65 to 70%. The left ventricle has normal function. The left ventricle has no regional wall motion abnormalities. Left ventricular diastolic parameters are consistent with Grade I diastolic dysfunction (impaired relaxation).  2. Right ventricular systolic function is normal. The right ventricular size is normal. There is moderately elevated pulmonary artery systolic pressure.  3. The mitral valve is normal in structure. No evidence of mitral valve regurgitation.  4. The aortic valve is normal in structure. Aortic valve regurgitation is not visualized.  5. The inferior vena cava is normal in size with greater than 50% respiratory variability, suggesting right atrial pressure of 3 mmHg. FINDINGS  Left Ventricle: Left ventricular ejection  fraction, by estimation, is 65 to 70%. The left ventricle has normal function. The left ventricle has no regional wall motion abnormalities. The left ventricular internal cavity size was normal in size. There is  borderline left ventricular hypertrophy. Left ventricular diastolic parameters are consistent with Grade I diastolic dysfunction (impaired relaxation). Right Ventricle: The right ventricular size is normal. No increase in right ventricular wall thickness. Right ventricular systolic function is normal. There  is moderately elevated pulmonary artery systolic pressure. The tricuspid regurgitant velocity is 3.49 m/s, and with an assumed right atrial pressure of 3 mmHg, the estimated right ventricular systolic pressure is 70.9 mmHg. Left Atrium: Left atrial size was normal in size. Right Atrium: Right atrial size was normal in size. Pericardium: There is no evidence of pericardial effusion. Mitral Valve: The mitral valve is normal in structure. No evidence of mitral valve regurgitation. Tricuspid Valve: The tricuspid valve is normal in structure. Tricuspid valve regurgitation is not demonstrated. Aortic Valve: The aortic valve is normal in structure. Aortic valve regurgitation is not visualized. Aortic valve mean gradient measures 4.0 mmHg. Aortic valve peak gradient measures 7.8 mmHg. Aortic valve area, by VTI measures 3.46 cm. Pulmonic Valve: The pulmonic valve was not well visualized. Pulmonic valve regurgitation is not visualized. Aorta: The aortic root is normal in size and structure. Venous: The inferior vena cava is normal in size with greater than 50% respiratory variability, suggesting right atrial pressure of 3 mmHg. IAS/Shunts: No atrial level shunt detected by color flow Doppler.  LEFT VENTRICLE PLAX 2D LVIDd:         3.73 cm  Diastology LVIDs:         2.66 cm  LV e' medial:    3.59 cm/s LV PW:         1.08 cm  LV E/e' medial:  25.4 LV IVS:        1.08 cm  LV e' lateral:   7.83 cm/s LVOT diam:      2.10 cm  LV E/e' lateral: 11.6 LV SV:         77 LV SV Index:   38 LVOT Area:     3.46 cm  RIGHT VENTRICLE RV Basal diam:  3.19 cm RV S prime:     14.90 cm/s TAPSE (M-mode): 3.6 cm LEFT ATRIUM             Index       RIGHT ATRIUM           Index LA diam:        3.20 cm 1.60 cm/m  RA Area:     18.40 cm LA Vol (A2C):   60.9 ml 30.47 ml/m RA Volume:   51.00 ml  25.51 ml/m LA Vol (A4C):   41.9 ml 20.96 ml/m LA Biplane Vol: 53.3 ml 26.67 ml/m  AORTIC VALVE                   PULMONIC VALVE AV Area (Vmax):    2.63 cm    PV Vmax:        0.34 m/s AV Area (Vmean):   2.74 cm    PV Peak grad:   0.4 mmHg AV Area (VTI):     3.46 cm    RVOT Peak grad: 2 mmHg AV Vmax:           139.50 cm/s AV Vmean:          93.750 cm/s AV VTI:            0.222 m AV Peak Grad:      7.8 mmHg AV Mean Grad:      4.0 mmHg LVOT Vmax:         106.00 cm/s LVOT Vmean:        74.200 cm/s LVOT VTI:          0.222 m LVOT/AV VTI ratio: 1.00  AORTA Ao Root diam: 3.30 cm MITRAL VALVE  TRICUSPID VALVE MV Area (PHT): 3.60 cm     TR Peak grad:   48.7 mmHg MV Decel Time: 211 msec     TR Vmax:        349.00 cm/s MV E velocity: 91.20 cm/s MV A velocity: 139.00 cm/s  SHUNTS MV E/A ratio:  0.66         Systemic VTI:  0.22 m                             Systemic Diam: 2.10 cm Kate Sable MD Electronically signed by Kate Sable MD Signature Date/Time: 07/22/2020/12:42:21 PM    Final       Scheduled Meds:  Chlorhexidine Gluconate Cloth  6 each Topical Daily   dutasteride  0.5 mg Oral Daily   ipratropium-albuterol  3 mL Nebulization TID PC   megestrol  200 mg Oral Daily   midodrine  10 mg Oral QID   pantoprazole  20 mg Oral BID   rosuvastatin  10 mg Oral Daily   sodium chloride flush  10-40 mL Intracatheter Q12H   sodium chloride flush  3 mL Intravenous Q12H   Continuous Infusions:  sodium chloride Stopped (07/22/20 2300)   azithromycin Stopped (07/22/20 2259)   cefTRIAXone (ROCEPHIN)  IV Stopped (07/22/20  2118)   heparin 1,700 Units/hr (07/23/20 0706)     LOS: 1 day      Time spent: 40 minutes   Dessa Phi, DO [facility] 07/23/2020, 9:55 AM   Available via Epic secure chat 7am-7pm After these hours, please refer to coverage provider listed on [email or web address]

## 2020-07-23 NOTE — Progress Notes (Signed)
[facility] for Heparin  Indication: pulmonary embolus and DVT  Allergies  Allergen Reactions  . Indomethacin Swelling    ankle swelling  . Tamsulosin Hcl Hives  . Sulfa Antibiotics Rash    Patient Measurements: Height: 5\' 11"  (180.3 cm) Weight: 79.8 kg (175 lb 14.8 oz) IBW/kg (Calculated) : 75.3 Heparin Dosing Weight: 80 kg   Vital Signs: Temp: 98 F (36.7 C) (11/04 0803) Temp Source: Oral (11/04 0803) BP: 99/63 (11/04 0803) Pulse Rate: 87 (11/04 0835)  Labs: Recent Labs    07/21/20 1333 07/21/20 1333 07/21/20 1818 07/22/20 0400 07/22/20 2119 07/23/20 0500 07/23/20 0743  HGB 10.2*   < >  --  9.1*  --  7.8*  --   HCT 30.6*  --   --  27.5*  --  23.5*  --   PLT 133*  --   --  129*  --  125*  --   APTT  --   --   --  31  --   --   --   LABPROT  --   --   --  13.9  --   --   --   INR  --   --   --  1.1  --   --   --   HEPARINUNFRC  --   --   --   --  0.19*  --  0.43  CREATININE 0.90  --   --   --   --  0.75  --   TROPONINIHS 12  --  12  --   --   --   --    < > = values in this interval not displayed.    Estimated Creatinine Clearance: 86.3 mL/min (by C-G formula based on SCr of 0.75 mg/dL).   Medical History: Past Medical History:  Diagnosis Date  . Anemia   . Back pain   . BPH (benign prostatic hyperplasia)   . Cirrhosis ([place])   . COPD (chronic obstructive pulmonary disease) ([place])   . ED (erectile dysfunction)   . GERD (gastroesophageal reflux disease)   . Hyperlipidemia   . Hypertension     Medications:  Medications Prior to Admission  Medication Sig Dispense Refill Last Dose  . acetaminophen (TYLENOL) 325 MG tablet Take 2 tablets (650 mg total) by mouth 3 (three) times daily. (Patient taking differently: Take 650 mg by mouth every 6 (six) hours as needed for moderate pain. ) 180 tablet 0 Past Month at prn  . cholecalciferol (VITAMIN D3) 25 MCG (1000 UNIT) tablet Take 1,000 Units by mouth daily.   Past Week at  Unknown time  . dutasteride (AVODART) 0.5 MG capsule Take 0.5 mg by mouth daily.   Past Week at Unknown time  . feeding supplement (ENSURE ENLIVE / ENSURE PLUS) LIQD Take 237 mLs by mouth 2 (two) times daily between meals. 14220 mL 0 Past Week at Unknown time  . folic acid (FOLVITE) 1 MG tablet Take 1 tablet (1 mg total) by mouth daily. 30 tablet 0 Past Week at Unknown time  . lactulose (CHRONULAC) 10 GM/15ML solution Take 45 mLs (30 g total) by mouth 2 (two) times daily. 1992 mL 0 Past Week at Unknown time  . lidocaine (LIDODERM) 5 % Place 1 patch onto the skin daily. Remove & Discard patch within 12 hours or as directed by MD 30 patch 0 Past Month at Unknown time  . Lidocaine (SALONPAS PAIN RELIEVING) 4 % PTCH Apply  1 patch topically every morning. 30 patch 0 Past Month at Unknown time  . loratadine (CLARITIN) 10 MG tablet Take 10 mg by mouth daily.   Past Week at Unknown time  . megestrol (MEGACE) 40 MG/ML suspension Take 5 mLs (200 mg total) by mouth daily. 150 mL 1 Past Week at Unknown time  . midodrine (PROAMATINE) 10 MG tablet Take 1 tablet (10 mg total) by mouth 4 (four) times daily. 120 tablet 0 07/21/2020 at Unknown time  . montelukast (SINGULAIR) 10 MG tablet TAKE 1 TABLET BY MOUTH EVERY DAY (Patient taking differently: Take 10 mg by mouth daily. ) 90 tablet 3 Past Week at Unknown time  . ondansetron (ZOFRAN) 8 MG tablet Take 1 tablet (8 mg total) by mouth 2 (two) times daily as needed for refractory nausea / vomiting. Start on day 3 after carboplatin chemo. 30 tablet 1 Past Month at Unknown time  . oxyCODONE (ROXICODONE) 15 MG immediate release tablet Take 1 tablet (15 mg total) by mouth every 4 (four) hours as needed for pain. 120 tablet 0 07/21/2020 at 0930  . pantoprazole (PROTONIX) 20 MG tablet TAKE ONE TABLET BY MOUTH TWICE A DAY 60 tablet 5 Past Week at Unknown time  . polyethylene glycol (MIRALAX / GLYCOLAX) 17 g packet Take 17 g by mouth daily as needed for moderate constipation. 30  each 0 Past Week at prn  . potassium chloride (K-DUR,KLOR-CON) 10 MEQ tablet Take 1 tablet (10 mEq total) by mouth 2 (two) times daily. 180 tablet 3 Past Week at Unknown time  . prochlorperazine (COMPAZINE) 10 MG tablet Take 1 tablet (10 mg total) by mouth every 6 (six) hours as needed (Nausea or vomiting). 30 tablet 1 Past Month at prn  . rosuvastatin (CRESTOR) 10 MG tablet Take 1 tablet (10 mg total) by mouth daily. 90 tablet 3 Past Month at Unknown time  . vitamin B-12 1000 MCG tablet Take 1 tablet (1,000 mcg total) by mouth daily. 180 tablet 1 Past Month at Unknown time  . diclofenac Sodium (VOLTAREN) 1 % GEL Apply 1 application topically 4 (four) times daily as needed (pain). (Patient not taking: Reported on 07/22/2020)   Not Taking at prn    Assessment: Pharmacy consulted to dose heparin in this 74 year old male admitted with DVT.   Pt was originally ordered therapeutic lovenox but has been switched to UFH in anticipation of vascular surgery. S/p thrombectomy 11/3  11/3 2119 HL 0.19  11/4 0743 HL 0.43    Goal of Therapy:  Heparin level 0.3-0.7 units/ml Monitor platelets by anticoagulation protocol: Yes   Plan: Heparin level is therapeutic. Will continue heparin infusion at current rate (1700 units/hr). Recheck heparin level in 8 hours. CBC daily while on heparin.   Oswald Hillock, PharmD, BCPS.  07/23/2020 9:45 AM

## 2020-07-23 NOTE — Consult Note (Signed)
Pharmacy Antibiotic Note  Dwayne Thompson is a 74 y.o. male admitted on 07/21/2020 with pneumonia.  Pharmacy has been consulted for pip/tazo and vancomycin dosing.  Plan: Zosyn 3.375g IV q8h (4 hour infusion).   Will give vancomycin 2000 mg x 1 loading dose, followed by 750 mg q8H per [facility] nomogram. Goal trough 15-20. Plan to order MRSA PCR, if negative recommend d/c vancomycin. Plan to order vancomycin level in 2-3 days.   Height: 5\' 11"  (180.3 cm) Weight: 79.8 kg (175 lb 14.8 oz) IBW/kg (Calculated) : 75.3  Temp (24hrs), Avg:98 F (36.7 C), Min:97.4 F (36.3 C), Max:98.6 F (37 C)  Recent Labs  Lab 07/21/20 1333 07/21/20 1818 07/22/20 0400 07/23/20 0500  WBC 18.0*  --  19.6* 23.2*  CREATININE 0.90  --   --  0.75  LATICACIDVEN  --  2.4*  --   --     Estimated Creatinine Clearance: 86.3 mL/min (by C-G formula based on SCr of 0.75 mg/dL).    Allergies  Allergen Reactions  . Indomethacin Swelling    ankle swelling  . Tamsulosin Hcl Hives  . Sulfa Antibiotics Rash    Antimicrobials this admission: 11/2 azithromycin + ceftriaxone >> 11/3 11/4 pip/tazo >>  11/4 vancomycin >>   Dose adjustments this admission: None  Microbiology results: None  11/4 MRSA PCR: ordered.  Thank you for allowing pharmacy to be a part of this patient's care.  Oswald Hillock, PharmD, BCPS 07/23/2020 10:51 AM

## 2020-07-23 NOTE — Progress Notes (Signed)
Initial Nutrition Assessment  DOCUMENTATION CODES:   Severe malnutrition in context of chronic illness  INTERVENTION:   Ensure Enlive po TID, each supplement provides 350 kcal and 20 grams of protein  Magic cup TID with meals, each supplement provides 290 kcal and 9 grams of protein  MVI daily   Pt at high refeed risk; recommend monitor potassium, magnesium and phosphorus labs daily until stable  NUTRITION DIAGNOSIS:   Severe Malnutrition related to cancer and cancer related treatments, other (see comment) (COPD, cirrhosis) as evidenced by 10 percent weight loss in 1 month, severe fat depletion, severe muscle depletion.  GOAL:   Patient will meet greater than or equal to 90% of their needs  MONITOR:   PO intake, Supplement acceptance, Labs, Weight trends, Skin, I & O's  REASON FOR ASSESSMENT:   Consult Assessment of nutrition requirement/status  ASSESSMENT:   74 y.o. 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   Met with pt in room today. Pt reports poor appetite and oral intake for the past month. Pt reports that he has been drinking vanilla Ensure daily at home. Per chart, pt is down 19lbs(10%) over the past month; this is significant. Pt is aware of his weight loss. RD discussed with pt the importance of adequate nutrition needed to preserve lean muscle. Pt eating only sips and bites of meals in hospital; pt ate an ice cream cup and a few bites of a hamburger for lunch today. RD will add supplements and MVI to help pt meet his estimated needs. Pt is at high refeed risk. Palliative care consult pending.   Medications reviewed and include: megace, protonix  Labs reviewed: wbc- 23.2(H), Hgb 7.8(L), Hct 23.5(L)  NUTRITION - FOCUSED PHYSICAL EXAM:    Most Recent Value   Orbital Region Moderate depletion  Upper Arm Region Severe depletion  Thoracic and Lumbar Region Moderate depletion  Buccal Region Moderate depletion  Temple Region Moderate depletion  Clavicle Bone Region Severe depletion  Clavicle and Acromion Bone Region Severe depletion  Scapular Bone Region Moderate depletion  Dorsal Hand Severe depletion  Patellar Region Severe depletion  Anterior Thigh Region Severe depletion  Posterior Calf Region Severe depletion  Edema (RD Assessment) None  Hair Reviewed  Eyes Reviewed  Mouth Reviewed  Skin Reviewed  Nails Reviewed     Diet Order:   Diet Order            Diet regular Room service appropriate? Yes; Fluid consistency: Thin  Diet effective now                EDUCATION NEEDS:   Education needs have been addressed  Skin:  Skin Assessment: Reviewed RN Assessment (Stage II buttocks, incision chest)  Last BM:  pta  Height:   Ht Readings from Last 1 Encounters:  07/22/20 _0  (1.803 m)    Weight:   Wt Readings from Last 1 Encounters:  07/23/20 79.8 kg    Ideal Body Weight:  78.18 kg  BMI:  Body mass index is 24.54 kg/m.  Estimated Nutritional Needs:   Kcal:  2200-2500kcal/day  Protein:  110-125g/day  Fluid:  >2L/day  Koleen Distance MS, RD, LDN Please refer to [facility] for RD and/or RD on-call/weekend/after hours pager

## 2020-07-23 NOTE — Plan of Care (Signed)

## 2020-07-23 NOTE — Care Management Important Message (Signed)
Important Message  Patient Details  Name: Dwayne Thompson MRN: [id number] Date of Birth: [birth year]   Medicare Important Message Given:  N/A - LOS <3 / Initial given by admissions  Initial Medicare IM given by Thornton Dales, Patient Access Associate on 07/23/2020 at 12:07pm.  Reviewed with patient.    [name] 07/23/2020, 3:22 PM

## 2020-07-23 NOTE — Progress Notes (Signed)
I met with patient and his wife.  Patient stated that his goals are aligned with the current scope of treatment.  It is his hope that he will improve during this hospitalization and be able to return home to pursue treatment as scheduled next week.  We did discuss the possibility that if he declines or does not improve that alternative options such as a focus on comfort might need to be considered.  However, both patient and wife clearly want to keep treating the cancer if possible.  Time Total: 20 minutes  Visit consisted of counseling and education dealing with the complex and emotionally intense issues of symptom management and palliative care in the setting of serious and potentially life-threatening illness.Greater than 50%  of this time was spent counseling and coordinating care related to the above assessment and plan.  Signed by: [name], PhD, NP-C

## 2020-07-23 NOTE — Progress Notes (Signed)
[facility] for Heparin  Indication: pulmonary embolus and DVT  Allergies  Allergen Reactions  . Indomethacin Swelling    ankle swelling  . Tamsulosin Hcl Hives  . Sulfa Antibiotics Rash    Patient Measurements: Height: 5\' 11"  (180.3 cm) Weight: 79.8 kg (175 lb 14.8 oz) IBW/kg (Calculated) : 75.3 Heparin Dosing Weight: 80 kg   Vital Signs: Temp: 98.3 F (36.8 C) (11/04 1133) Temp Source: Oral (11/04 1133) BP: 97/64 (11/04 1133) Pulse Rate: 88 (11/04 1412)  Labs: Recent Labs    07/21/20 1333 07/21/20 1333 07/21/20 1818 07/22/20 0400 07/22/20 2119 07/23/20 0500 07/23/20 0743 07/23/20 1620  HGB 10.2*   < >  --  9.1*  --  7.8*  --   --   HCT 30.6*  --   --  27.5*  --  23.5*  --   --   PLT 133*  --   --  129*  --  125*  --   --   APTT  --   --   --  31  --   --   --   --   LABPROT  --   --   --  13.9  --   --   --   --   INR  --   --   --  1.1  --   --   --   --   HEPARINUNFRC  --   --   --   --  0.19*  --  0.43 0.64  CREATININE 0.90  --   --   --   --  0.75  --   --   TROPONINIHS 12  --  12  --   --   --   --   --    < > = values in this interval not displayed.    Estimated Creatinine Clearance: 86.3 mL/min (by C-G formula based on SCr of 0.75 mg/dL).   Medical History: Past Medical History:  Diagnosis Date  . Anemia   . Back pain   . BPH (benign prostatic hyperplasia)   . Cirrhosis ([place])   . COPD (chronic obstructive pulmonary disease) ([place])   . ED (erectile dysfunction)   . GERD (gastroesophageal reflux disease)   . Hyperlipidemia   . Hypertension     Medications:  Medications Prior to Admission  Medication Sig Dispense Refill Last Dose  . acetaminophen (TYLENOL) 325 MG tablet Take 2 tablets (650 mg total) by mouth 3 (three) times daily. (Patient taking differently: Take 650 mg by mouth every 6 (six) hours as needed for moderate pain. ) 180 tablet 0 Past Month at prn  . cholecalciferol (VITAMIN D3) 25 MCG (1000  UNIT) tablet Take 1,000 Units by mouth daily.   Past Week at Unknown time  . dutasteride (AVODART) 0.5 MG capsule Take 0.5 mg by mouth daily.   Past Week at Unknown time  . feeding supplement (ENSURE ENLIVE / ENSURE PLUS) LIQD Take 237 mLs by mouth 2 (two) times daily between meals. 14220 mL 0 Past Week at Unknown time  . folic acid (FOLVITE) 1 MG tablet Take 1 tablet (1 mg total) by mouth daily. 30 tablet 0 Past Week at Unknown time  . lactulose (CHRONULAC) 10 GM/15ML solution Take 45 mLs (30 g total) by mouth 2 (two) times daily. 1992 mL 0 Past Week at Unknown time  . lidocaine (LIDODERM) 5 % Place 1 patch onto the skin daily. Remove & Discard  patch within 12 hours or as directed by MD 30 patch 0 Past Month at Unknown time  . Lidocaine (SALONPAS PAIN RELIEVING) 4 % PTCH Apply 1 patch topically every morning. 30 patch 0 Past Month at Unknown time  . loratadine (CLARITIN) 10 MG tablet Take 10 mg by mouth daily.   Past Week at Unknown time  . megestrol (MEGACE) 40 MG/ML suspension Take 5 mLs (200 mg total) by mouth daily. 150 mL 1 Past Week at Unknown time  . midodrine (PROAMATINE) 10 MG tablet Take 1 tablet (10 mg total) by mouth 4 (four) times daily. 120 tablet 0 07/21/2020 at Unknown time  . montelukast (SINGULAIR) 10 MG tablet TAKE 1 TABLET BY MOUTH EVERY DAY (Patient taking differently: Take 10 mg by mouth daily. ) 90 tablet 3 Past Week at Unknown time  . ondansetron (ZOFRAN) 8 MG tablet Take 1 tablet (8 mg total) by mouth 2 (two) times daily as needed for refractory nausea / vomiting. Start on day 3 after carboplatin chemo. 30 tablet 1 Past Month at Unknown time  . oxyCODONE (ROXICODONE) 15 MG immediate release tablet Take 1 tablet (15 mg total) by mouth every 4 (four) hours as needed for pain. 120 tablet 0 07/21/2020 at 0930  . pantoprazole (PROTONIX) 20 MG tablet TAKE ONE TABLET BY MOUTH TWICE A DAY 60 tablet 5 Past Week at Unknown time  . polyethylene glycol (MIRALAX / GLYCOLAX) 17 g packet Take  17 g by mouth daily as needed for moderate constipation. 30 each 0 Past Week at prn  . potassium chloride (K-DUR,KLOR-CON) 10 MEQ tablet Take 1 tablet (10 mEq total) by mouth 2 (two) times daily. 180 tablet 3 Past Week at Unknown time  . prochlorperazine (COMPAZINE) 10 MG tablet Take 1 tablet (10 mg total) by mouth every 6 (six) hours as needed (Nausea or vomiting). 30 tablet 1 Past Month at prn  . rosuvastatin (CRESTOR) 10 MG tablet Take 1 tablet (10 mg total) by mouth daily. 90 tablet 3 Past Month at Unknown time  . vitamin B-12 1000 MCG tablet Take 1 tablet (1,000 mcg total) by mouth daily. 180 tablet 1 Past Month at Unknown time  . diclofenac Sodium (VOLTAREN) 1 % GEL Apply 1 application topically 4 (four) times daily as needed (pain). (Patient not taking: Reported on 07/22/2020)   Not Taking at prn    Assessment: Pharmacy consulted to dose heparin in this 74 year old male admitted with DVT.   Pt was originally ordered therapeutic lovenox but has been switched to UFH in anticipation of vascular surgery. S/p thrombectomy 11/3  11/3 2119 HL 0.19  11/4 0743 HL 0.43  11/4 1620 HL 0.64   Goal of Therapy:  Heparin level 0.3-0.7 units/ml Monitor platelets by anticoagulation protocol: Yes   Plan: Heparin level is therapeutic x 2. Will continue heparin infusion at current rate (1700 units/hr). HL and CBC daily while on heparin.   Tawnya Crook, PharmD 07/23/2020 5:21 PM

## 2020-07-23 NOTE — Evaluation (Signed)
Physical Therapy Evaluation Patient Details Name: Dwayne Thompson MRN: [id number] DOB: 1946/04/14 Today's Date: 07/23/2020   History of Present Illness  Pt is a 74 y.o. male presenting to hospital 11/2. 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  Pt presenting to ED "with worsening shortness of breath, hypoxia, tachycardia and tachypnea".  Pt admitted with R sided PE with R heart strain, B LE DVT, extensive small cell lung CA (chronic Pleurex catheter in place), severe sepsis secondary post obstructive PNA L lower lobe, and acute on chronic hypoxemia respiratory failure.  S/p mechanical thrombectomy 11/2.  Clinical Impression  Prior to hospital admission, pt was modified independent with household ambulation with RW; lives at home with wife in 1 level home (2 STE with B railings).  Currently pt is SBA semi-supine to sitting edge of bed and CGA with transfer bed to recliner with UE support; deferred ambulation (pt reporting not feeling up to walking yet).  Pt reporting no pain during session.  Mild SOB with activity (O2 sats 89% on 4 L O2 after getting to recliner but within a few minutes of rest pt's O2 sats improved to 93% on 4 L O2 via nasal cannula); HR WFL during sessions activities.  Generalized weakness and decreased activity tolerance noted.  Pt would benefit from skilled PT to address noted impairments and functional limitations (see below for any additional details).  Pt reports wanting to discharge home with support from family and HHPT.  Upon hospital discharge, pt would benefit from HHPT and 24/7 assistance; also recommend manual w/c d/t pt's generalized weakness and impaired activity tolerance.  Patient suffers from COPD and cancer which impairs his ability to perform daily  activities like toileting, feeding, dressing, grooming, bathing in the home. A cane, walker, crutch will not resolve the patient's issue with performing activities of daily living. A lightweight wheelchair and cushion is required/recommended and will allow patient to safely perform daily activities.   Patient can safely propel the wheelchair in the home or has a caregiver who can provide assistance.      Follow Up Recommendations Home health PT;Supervision/Assistance - 24 hour    Equipment Recommendations  Rolling walker with 5" wheels;3in1 (PT);Wheelchair (measurements PT);Wheelchair cushion (measurements PT)    Recommendations for Other Services OT consult     Precautions / Restrictions Precautions Precautions: Fall Precaution Comments: Monitor O2 sats; aspiration Restrictions Weight Bearing Restrictions: No      Mobility  Bed Mobility Overal bed mobility: Needs Assistance Bed Mobility: Supine to Sit     Supine to sit: Supervision;HOB elevated     General bed mobility comments: mild increased effort to perform on own    Transfers Overall transfer level: Needs assistance Equipment used: None Transfers: Sit to/from [facility] Sit to Stand: Min guard Stand pivot transfers: Min guard       General transfer comment: stand step turn bed to recliner with UE support at times on stable furniture  Ambulation/Gait             General Gait Details: Deferred d/t pt not feeling up to walking  Stairs            Wheelchair Mobility    Modified Rankin (Stroke Patients Only)       Balance Overall balance assessment: Needs assistance Sitting-balance support: No  upper extremity supported;Feet supported Sitting balance-Leahy Scale: Good Sitting balance - Comments: steady sitting reaching within BOS     Standing balance-Leahy Scale: Fair Standing balance comment: pt preferring at least single UE support for static standing balance                              Pertinent Vitals/Pain Pain Assessment: No/denies pain    Home Living Family/patient expects to be discharged to:: Private residence Living Arrangements: Spouse/significant other Available Help at Discharge: Family;Available 24 hours/day Type of Home: House Home Access: Stairs to enter Entrance Stairs-Rails: Right;Left;Can reach both Entrance Stairs-Number of Steps: 2 Home Layout: One level Home Equipment: Walker - 2 wheels;Cane - single point;Shower seat;Grab bars - tub/shower      Prior Function Level of Independence: Needs assistance   Gait / Transfers Assistance Needed: Ambulatory household distances with RW  ADL's / Homemaking Assistance Needed: Wife assists with household tasks        Hand Dominance        Extremity/Trunk Assessment   Upper Extremity Assessment Upper Extremity Assessment: Defer to OT evaluation;Generalized weakness    Lower Extremity Assessment Lower Extremity Assessment: Generalized weakness       Communication   Communication: No difficulties  Cognition Arousal/Alertness: Awake/alert Behavior During Therapy: Flat affect Overall Cognitive Status: Within Functional Limits for tasks assessed                                        General Comments   Nursing cleared pt for participation in physical therapy.  Pt agreeable to PT session.    Exercises     Assessment/Plan    PT Assessment Patient needs continued PT services  PT Problem List Decreased strength;Decreased range of motion;Decreased activity tolerance;Decreased balance;Decreased mobility;Decreased knowledge of use of DME;Cardiopulmonary status limiting activity;Decreased skin integrity       PT Treatment Interventions DME instruction;Gait training;Stair training;Functional mobility training;Therapeutic activities;Therapeutic exercise;Balance training;Patient/family education    PT Goals (Current goals can be found in the Care Plan  section)  Acute Rehab PT Goals Patient Stated Goal: to improve mobility and breathing PT Goal Formulation: With patient Time For Goal Achievement: 08/06/20 Potential to Achieve Goals: Fair    Frequency Min 2X/week   Barriers to discharge        Co-evaluation               AM-PAC PT "6 Clicks" Mobility  Outcome Measure Help needed turning from your back to your side while in a flat bed without using bedrails?: None Help needed moving from lying on your back to sitting on the side of a flat bed without using bedrails?: A Little Help needed moving to and from a bed to a chair (including a wheelchair)?: A Little Help needed standing up from a chair using your arms (e.g., wheelchair or bedside chair)?: A Little Help needed to walk in hospital room?: A Little Help needed climbing 3-5 steps with a railing? : A Lot 6 Click Score: 18    End of Session Equipment Utilized During Treatment: Oxygen (4 L O2 via nasal cannula) Activity Tolerance: Patient limited by fatigue Patient left: in chair;with call bell/phone within reach;with chair alarm set;with family/visitor present Nurse Communication: Mobility status;Precautions;Other (comment) (pt's vitals during session) PT Visit Diagnosis: Muscle weakness (generalized) (M62.81);Other abnormalities of gait and mobility (R26.89);Difficulty in  walking, not elsewhere classified (R26.2)    Time: 2505-3976 PT Time Calculation (min) (ACUTE ONLY): 36 min   Charges:   PT Evaluation $PT Eval Low Complexity: 1 Low PT Treatments $Therapeutic Activity: 8-22 mins       Leitha Bleak, PT 07/23/20, 2:29 PM

## 2020-07-23 NOTE — Progress Notes (Addendum)
  PROGRESS NOTE  Notified by RN that patient does not wish to be DNR status.  Patient changed to full code.  Palliative care on board and following for goals of care discussions.  Discussed with patient and wife over the phone and they were in agreement.    Dessa Phi, DO [facility] 07/23/2020, 5:34 PM  Available via Epic secure chat 7am-7pm After these hours, please refer to coverage provider listed on [email or web address]

## 2020-07-23 NOTE — TOC Initial Note (Addendum)
Transition of Care [facility]) - Initial/Assessment Note    Patient Details  Name: Dwayne Thompson MRN: [id number] Date of Birth: Sep 26, 1945  [facility]) CM/SW Contact:    Eileen Stanford, LCSW Phone Number: 07/23/2020, 10:29 AM  Clinical Narrative: Pt is alert and oriented. CSW met with pt at bedside. Pt states he lives home with his wife. Pt states his wife transports him to his doctor appointments. Pt states [facility] [place] [facility]. Pt has a established PCP--Dr. Rosanna Randy. Pt has no home services at this current moment.   Pt states he will not go to SNF. Pt does want HH--PT. Pt states his sister is a Therapist, sports and can handle the medical part but states he will need PT. CSW will start the [facility] search, given pt has [employer].          [facility] can service pt. Wife at beside--pt agreeable.  Expected Discharge Plan: [place] Barriers to Discharge: Continued Medical Work up   Patient Goals and CMS Choice Patient states their goals for this hospitalization and ongoing recovery are:: to get better   Choice offered to / list presented to : Patient  Expected Discharge Plan and Services Expected Discharge Plan: [place] Acute Care Choice: [place] arrangements for the past 2 months: Single Family Home                                      Prior Living Arrangements/Services Living arrangements for the past 2 months: Single Family Home Lives with:: Spouse Patient language and need for interpreter reviewed:: Yes Do you feel safe going back to the place where you live?: Yes      Need for Family Participation in Patient Care: Yes (Comment) Care giver support system in place?: Yes (comment)   Criminal Activity/Legal Involvement Pertinent to Current Situation/Hospitalization: No - Comment as needed  Activities of Daily Living Home Assistive Devices/Equipment: Cane (specify quad or straight), Shower  chair with back, Walker (specify type) ADL Screening (condition at time of admission) Patient's cognitive ability adequate to safely complete daily activities?: No Is the patient deaf or have difficulty hearing?: No Does the patient have difficulty seeing, even when wearing glasses/contacts?: No Does the patient have difficulty concentrating, remembering, or making decisions?: No Patient able to express need for assistance with ADLs?: Yes Does the patient have difficulty dressing or bathing?: No Independently performs ADLs?: No Communication: Independent Dressing (OT): Needs assistance Grooming: Independent Feeding: Independent Bathing: Needs assistance Toileting: Independent with device (comment) In/Out Bed: Needs assistance Is this a change from baseline?: Pre-admission baseline Walks in Home: Independent with device (comment) Does the patient have difficulty walking or climbing stairs?: Yes Weakness of Legs: None Weakness of Arms/Hands: None  Permission Sought/Granted Permission sought to share information with : Family Supports    Share Information with NAME: janice     Permission granted to share info w Relationship: spouse     Emotional Assessment Appearance:: Appears stated age Attitude/Demeanor/Rapport: Engaged Affect (typically observed): Accepting, Appropriate Orientation: : Oriented to Situation, Oriented to  Time, Oriented to Place, Oriented to Self Alcohol / Substance Use: Not Applicable Psych Involvement: No (comment)  Admission diagnosis:  Edema [R60.9] Shortness of breath [R06.02] DOE (dyspnea on exertion) [R06.00] Hypoxia [R09.02] Respiratory failure, acute-on-chronic (HCC) [J96.20] Small cell  lung cancer, 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 Pulmonary embolism ([place]) 07/22/2020  . Palliative care encounter   . Respiratory failure, acute-on-chronic ([place]) 07/21/2020  .  Hyperlipidemia   . GERD (gastroesophageal reflux disease)   . COPD (chronic obstructive pulmonary disease) ([place])   . BPH (benign prostatic hyperplasia)   . Poor appetite 07/19/2020  . Pressure injury of buttock, stage 1 07/19/2020  . Pleural effusion 07/15/2020  . Renal insufficiency 07/15/2020  . Normocytic anemia 07/06/2020  . Cancer-related pain 07/06/2020  . Encounter for antineoplastic chemotherapy 07/06/2020  . Encounter for antineoplastic immunotherapy 07/06/2020  . AKI (acute kidney injury) ([place])   . Hypotension   . Small cell lung cancer ([place]) 06/27/2020  . Chest tube in place   . Pressure injury of skin 06/23/2020  . Pleural mass   . Bone metastasis ([place])   . Hemothorax   . Acute on chronic respiratory failure with hypoxia ([place]) 06/22/2020  . Cirrhosis ([place])   . Hemothorax on left   . Left flank pain 04/23/2020  . Hypertension 03/12/2019  . Thoracic aortic aneurysm ([place]) 03/12/2019  . Abnormal chest CT 02/13/2019  . Difficulty walking 12/06/2018  . Muscle weakness 12/06/2018  . Cellulitis of lower leg 09/17/2018  . CLE (columnar lined esophagus)   . Special screening for malignant neoplasms, colon   . Benign neoplasm of ascending colon   . Polyp of sigmoid colon   . Diverticulosis of large intestine without diverticulitis   . Iron deficiency anemia due to chronic blood loss 02/18/2018  . Folate deficiency 02/14/2018  . B12 deficiency 01/10/2018  . Folic acid deficiency 23/53/6144  . 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 Helicobacter pylori gastritis (chronic gastritis) 01/10/2018  . Thrombocytopenia ([place]) 01/10/2018  . Internal hemorrhoids   . Acute gastrointestinal hemorrhage   . Stomach irritation   . Duodenal nodule   . Melena 01/01/2018  . Neck pain on right side 03/23/2016  . History of tobacco abuse 03/23/2016  . Cervical lymphadenitis 03/23/2016  . 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 Allergic rhinitis 03/28/2015  . 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 Failure of erection 03/28/2015  . Acid reflux 03/28/2015  . Arthritis, degenerative 03/28/2015  . Change in blood platelet count 03/28/2015  . Temporary cerebral vascular dysfunction 03/28/2015  . Personal history of tobacco use, presenting hazards to health 03/28/2015   PCP:  Jerrol Banana., MD Pharmacy:   [id number] [place]Phone: [phone]: [address]Phone: [phone]: [phone]    Social Determinants of Health ([place] Interventions    Readmission Risk Interventions Readmission Risk Prevention Plan 07/23/2020  Transportation Screening Complete  Medication Review (RA[place] Complete  PCP or Specialist appointment within 3-5 days of discharge Complete  HRI or HHollywoodComplete  SW Recovery Care/Counseling Consult Complete  PPickrellNot Applicable  Some recent data might be hidden

## 2020-07-23 NOTE — Evaluation (Signed)
Occupational Therapy Evaluation Patient Details Name: Dwayne Thompson MRN: [id number] DOB: 19-Mar-1946 Today's Date: 07/23/2020    History of Present Illness Pt is a 74 y.o. male presenting to hospital 11/2. 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  Pt presenting to ED "with worsening shortness of breath, hypoxia, tachycardia and tachypnea".  Pt admitted with R sided PE with R heart strain, B LE DVT, extensive small cell lung CA (chronic Pleurex catheter in place), severe sepsis secondary post obstructive PNA L lower lobe, and acute on chronic hypoxemia respiratory failure.  S/p mechanical thrombectomy 11/2.   Clinical Impression   Pt seen for OT evaluation this date. Until past month, pt was independent and active. Since recent admission, pt has required increasing assist with IADL and bathing 2/2 SOB/fatigue. Pt able to ambulate short [place] distances but gets SOB quickly. Pt went home on 2 liters of O2 after most recent admission prior to this admission. Pt reports becoming easily fatigued or out of breath with minimal exertion. Pt currently requires Min A for LB bathing and dressing, CGA for ADL transfers due to current functional impairments (See OT Problem List below). Pt/spouse educated in energy conservation strategies including pursed lip breathing, activity pacing, home/routines modifications, work simplification, AE/DME, prioritizing of meaningful occupations, and falls prevention. Pt/spouse verbalized understanding and would benefit from additional skilled OT services to maximize recall and carryover of learned techniques and facilitate implementation of learned techniques into daily routines. Upon discharge, recommend HHOT services and 24/7 supervision/assist.      Follow Up  Recommendations  Home health OT;Supervision/Assistance - 24 hour    Equipment Recommendations  3 in 1 bedside commode;Wheelchair (measurements OT);Wheelchair cushion (measurements OT);Other (comment) Management consultant)    Recommendations for Other Services       Precautions / Restrictions Precautions Precautions: Fall Precaution Comments: Monitor O2 sats; aspiration Restrictions Weight Bearing Restrictions: No      Mobility Bed Mobility Overal bed mobility: Needs Assistance Bed Mobility: Supine to Sit     Supine to sit: Supervision;HOB elevated     General bed mobility comments: deferred, pt up in recliner, wishes to remain up    Transfers Overall transfer level: Needs assistance Equipment used: None Transfers: Sit to/from Stand Sit to Stand: Min guard Stand pivot transfers: Min guard       General transfer comment: stand step turn bed to recliner with UE support at times on stable furniture    Balance Overall balance assessment: Needs assistance Sitting-balance support: No upper extremity supported;Feet supported Sitting balance-Leahy Scale: Good Sitting balance - Comments: steady sitting reaching within BOS   Standing balance support: Single extremity supported Standing balance-Leahy Scale: Fair Standing balance comment: pt preferring at least single UE support for static standing balance                           ADL either performed or assessed with clinical judgement   ADL Overall ADL's : Needs assistance/impaired                                       General ADL Comments: Min-Mod A for LB ADL tasks from seated position, CGA for ADL transfers, fatigues quickly  Vision Baseline Vision/History: Wears glasses Wears Glasses: At all times Patient Visual Report: No change from baseline       Perception     Praxis      Pertinent Vitals/Pain Pain Assessment: No/denies pain     Hand Dominance Right   Extremity/Trunk  Assessment Upper Extremity Assessment Upper Extremity Assessment: Generalized weakness   Lower Extremity Assessment Lower Extremity Assessment: Generalized weakness       Communication Communication Communication: No difficulties   Cognition Arousal/Alertness: Awake/alert Behavior During Therapy: Flat affect Overall Cognitive Status: Within Functional Limits for tasks assessed                                     General Comments       Exercises Other Exercises Other Exercises: Pt/spouse educated in home/routines modifications, activity pacing, work simplification, AE/DME, and falls prevention strategies; would benefit from additional instruction to support recall and carryover   Shoulder Instructions      Home Living Family/patient expects to be discharged to:: Private residence Living Arrangements: Spouse/significant other Available Help at Discharge: Family;Available 24 hours/day Type of Home: House Home Access: Stairs to enter [employer] of Steps: 2 Entrance Stairs-Rails: Right;Left;Can reach both Home Layout: One level     Bathroom Shower/Tub: Tub/shower unit;Walk-in shower (has been using walk in shower recently)   Bathroom Toilet: Standard (although pt reports it is higher height)     Home Equipment: Walker - 2 wheels;Cane - single point;Shower seat;Grab bars - tub/shower          Prior Functioning/Environment Level of Independence: Needs assistance  Gait / Transfers Assistance Needed: Ambulatory household distances with RW, SOB easily ADL's / Homemaking Assistance Needed: Wife assists with household tasks, LB dressing, approx 50% bathing, and IADL tasks.            OT Problem List: Decreased strength;Cardiopulmonary status limiting activity;Decreased activity tolerance;Decreased knowledge of use of DME or AE;Impaired balance (sitting and/or standing)      OT Treatment/Interventions: Self-care/ADL training;Therapeutic  exercise;Therapeutic activities;Energy conservation;DME and/or AE instruction;Patient/family education;Balance training    OT Goals(Current goals can be found in the care plan section) Acute Rehab OT Goals Patient Stated Goal: to improve mobility and breathing OT Goal Formulation: With patient/family Time For Goal Achievement: 08/06/20 Potential to Achieve Goals: Good ADL Goals Pt Will Perform Lower Body Dressing: with modified independence;with adaptive equipment;sit to/from stand Pt Will Transfer to Toilet: with supervision;ambulating (comfort height, LRAD for amb) Additional ADL Goal #1: Pt will verbalize plan to implement at least 2 learned energy conservation strategies to maximize safety/indep with ADL. Additional ADL Goal #2: Pt will verbalize plan to implement at least 2 learned falls prevention strategies to maximize safety/indep with ADL.  OT Frequency: Min 2X/week   Barriers to D/C:            Co-evaluation              AM-PAC OT "6 Clicks" Daily Activity     Outcome Measure Help from another person eating meals?: None Help from another person taking care of personal grooming?: None Help from another person toileting, which includes using toliet, bedpan, or urinal?: A Little Help from another person bathing (including washing, rinsing, drying)?: A Little Help from another person to put on and taking off regular upper body clothing?: None Help from another person to put on and taking off regular lower body clothing?: A Little  6 Click Score: 21   End of Session    Activity Tolerance: Patient tolerated treatment well Patient left: in chair;with call bell/phone within reach;with chair alarm set;with nursing/sitter in room;with family/visitor present  OT Visit Diagnosis: Other abnormalities of gait and mobility (R26.89);Muscle weakness (generalized) (M62.81)                Time: 1638-4536 OT Time Calculation (min): 18 min Charges:  OT General Charges $OT Visit: 1  Visit OT Evaluation $OT Eval Moderate Complexity: 1 Mod OT Treatments $Self Care/Home Management : 8-22 mins  Jeni Salles, MPH, MS, OTR/L ascom [phone] 07/23/20, 2:45 PM

## 2020-07-24 ENCOUNTER — Inpatient Hospital Stay: Payer: Medicare Other

## 2020-07-24 DIAGNOSIS — J9621 Acute and chronic respiratory failure with hypoxia: Secondary | ICD-10-CM | POA: Diagnosis not present

## 2020-07-24 DIAGNOSIS — Z515 Encounter for palliative care: Secondary | ICD-10-CM | POA: Diagnosis not present

## 2020-07-24 LAB — BASIC METABOLIC PANEL
Anion gap: 10 (ref 5–15)
BUN: 13 mg/dL (ref 8–23)
CO2: 20 mmol/L — ABNORMAL LOW (ref 22–32)
Calcium: 8.6 mg/dL — ABNORMAL LOW (ref 8.9–10.3)
Chloride: 103 mmol/L (ref 98–111)
Creatinine, Ser: 0.85 mg/dL (ref 0.61–1.24)
GFR, Estimated: >60 mL/min (ref >60)
Glucose, Bld: 100 mg/dL — ABNORMAL HIGH (ref 70–99)
Potassium: 3.9 mmol/L (ref 3.5–5.1)
Sodium: 133 mmol/L — ABNORMAL LOW (ref 135–145)

## 2020-07-24 LAB — CBC
HCT: 23.6 % — ABNORMAL LOW (ref 39.0–52.0)
Hemoglobin: 7.9 g/dL — ABNORMAL LOW (ref 13.0–17.0)
MCH: 29 pg (ref 26.0–34.0)
MCHC: 33.5 g/dL (ref 30.0–36.0)
MCV: 86.8 fL (ref 80.0–100.0)
Platelets: 147 10*3/uL — ABNORMAL LOW (ref 150–400)
RBC: 2.72 MIL/uL — ABNORMAL LOW (ref 4.22–5.81)
RDW: 14.7 % (ref 11.5–15.5)
WBC: 23.3 10*3/uL — ABNORMAL HIGH (ref 4.0–10.5)
nRBC: 0 % (ref 0.0–0.2)

## 2020-07-24 LAB — HEPARIN LEVEL (UNFRACTIONATED): Heparin Unfractionated: 0.51 IU/mL (ref 0.30–0.70)

## 2020-07-24 MED ORDER — IPRATROPIUM-ALBUTEROL 0.5-2.5 (3) MG/3ML IN SOLN: 3.0000 mL | RESPIRATORY_TRACT | Status: DC | PRN

## 2020-07-24 NOTE — Clinical Social Work Note (Cosign Needed)
    Durable Medical Equipment  (From admission, onward)         Start     Ordered   07/23/20 1602  For home use only DME standard manual wheelchair with seat cushion  Once       Comments: Patient suffers from small cell lung cancer which impairs their ability to perform daily activities like bathing, dressing, feeding, grooming, and toileting in the home.  A walker will not resolve issue with performing activities of daily living. A wheelchair will allow patient to safely perform daily activities. Patient can safely propel the wheelchair in the home or has a caregiver who can provide assistance. Length of need Lifetime. Accessories: elevating leg rests (ELRs), wheel locks, extensions and anti-tippers.   07/23/20 1602   07/23/20 1601  For home use only DME Walker rolling  Once       Question Answer Comment  Walker: With [place]   Patient needs a walker to treat with the following condition Small cell lung cancer ([place])      07/23/20 1602   07/23/20 1601  For home use only DME 3 n 1  Once        07/23/20 1602

## 2020-07-24 NOTE — Progress Notes (Signed)
[place] for Heparin  Indication: pulmonary embolus and DVT  Allergies  Allergen Reactions  . Indomethacin Swelling    ankle swelling  . Tamsulosin Hcl Hives  . Sulfa Antibiotics Rash    Patient Measurements: Height: 5\' 11"  (180.3 cm) Weight: 79 kg (174 lb 2.6 oz) IBW/kg (Calculated) : 75.3 Heparin Dosing Weight: 80 kg   Vital Signs: Temp: 98.7 F (37.1 C) (11/05 0456) Temp Source: Oral (11/05 0456) BP: 92/58 (11/05 0456) Pulse Rate: 81 (11/05 0456)  Labs: Recent Labs    07/21/20 1333 07/21/20 1333 07/21/20 1818 07/22/20 0400 07/22/20 2119 07/23/20 0500 07/23/20 0743 07/23/20 1620 07/24/20 0530  HGB 10.2*   < >  --  9.1*  --  7.8*  --   --  7.9*  HCT 30.6*   < >  --  27.5*  --  23.5*  --   --  23.6*  PLT 133*   < >  --  129*  --  125*  --   --  147*  APTT  --   --   --  31  --   --   --   --   --   LABPROT  --   --   --  13.9  --   --   --   --   --   INR  --   --   --  1.1  --   --   --   --   --   HEPARINUNFRC  --   --   --   --    < >  --  0.43 0.64 0.51  CREATININE 0.90  --   --   --   --  0.75  --   --  0.85  TROPONINIHS 12  --  12  --   --   --   --   --   --    < > = values in this interval not displayed.    Estimated Creatinine Clearance: 81.2 mL/min (by C-G formula based on SCr of 0.85 mg/dL).   Medical History: Past Medical History:  Diagnosis Date  . Anemia   . Back pain   . BPH (benign prostatic hyperplasia)   . Cirrhosis ([place])   . COPD (chronic obstructive pulmonary disease) ([place])   . ED (erectile dysfunction)   . GERD (gastroesophageal reflux disease)   . Hyperlipidemia   . Hypertension     Medications:  Medications Prior to Admission  Medication Sig Dispense Refill Last Dose  . acetaminophen (TYLENOL) 325 MG tablet Take 2 tablets (650 mg total) by mouth 3 (three) times daily. (Patient taking differently: Take 650 mg by mouth every 6 (six) hours as needed for moderate pain. ) 180 tablet 0 Past  Month at prn  . cholecalciferol (VITAMIN D3) 25 MCG (1000 UNIT) tablet Take 1,000 Units by mouth daily.   Past Week at Unknown time  . dutasteride (AVODART) 0.5 MG capsule Take 0.5 mg by mouth daily.   Past Week at Unknown time  . feeding supplement (ENSURE ENLIVE / ENSURE PLUS) LIQD Take 237 mLs by mouth 2 (two) times daily between meals. 14220 mL 0 Past Week at Unknown time  . folic acid (FOLVITE) 1 MG tablet Take 1 tablet (1 mg total) by mouth daily. 30 tablet 0 Past Week at Unknown time  . lactulose (CHRONULAC) 10 GM/15ML solution Take 45 mLs (30 g total) by mouth 2 (two) times daily.  1992 mL 0 Past Week at Unknown time  . lidocaine (LIDODERM) 5 % Place 1 patch onto the skin daily. Remove & Discard patch within 12 hours or as directed by MD 30 patch 0 Past Month at Unknown time  . Lidocaine (SALONPAS PAIN RELIEVING) 4 % PTCH Apply 1 patch topically every morning. 30 patch 0 Past Month at Unknown time  . loratadine (CLARITIN) 10 MG tablet Take 10 mg by mouth daily.   Past Week at Unknown time  . megestrol (MEGACE) 40 MG/ML suspension Take 5 mLs (200 mg total) by mouth daily. 150 mL 1 Past Week at Unknown time  . midodrine (PROAMATINE) 10 MG tablet Take 1 tablet (10 mg total) by mouth 4 (four) times daily. 120 tablet 0 07/21/2020 at Unknown time  . montelukast (SINGULAIR) 10 MG tablet TAKE 1 TABLET BY MOUTH EVERY DAY (Patient taking differently: Take 10 mg by mouth daily. ) 90 tablet 3 Past Week at Unknown time  . ondansetron (ZOFRAN) 8 MG tablet Take 1 tablet (8 mg total) by mouth 2 (two) times daily as needed for refractory nausea / vomiting. Start on day 3 after carboplatin chemo. 30 tablet 1 Past Month at Unknown time  . oxyCODONE (ROXICODONE) 15 MG immediate release tablet Take 1 tablet (15 mg total) by mouth every 4 (four) hours as needed for pain. 120 tablet 0 07/21/2020 at 0930  . pantoprazole (PROTONIX) 20 MG tablet TAKE ONE TABLET BY MOUTH TWICE A DAY 60 tablet 5 Past Week at Unknown time  .  polyethylene glycol (MIRALAX / GLYCOLAX) 17 g packet Take 17 g by mouth daily as needed for moderate constipation. 30 each 0 Past Week at prn  . potassium chloride (K-DUR,KLOR-CON) 10 MEQ tablet Take 1 tablet (10 mEq total) by mouth 2 (two) times daily. 180 tablet 3 Past Week at Unknown time  . prochlorperazine (COMPAZINE) 10 MG tablet Take 1 tablet (10 mg total) by mouth every 6 (six) hours as needed (Nausea or vomiting). 30 tablet 1 Past Month at prn  . rosuvastatin (CRESTOR) 10 MG tablet Take 1 tablet (10 mg total) by mouth daily. 90 tablet 3 Past Month at Unknown time  . vitamin B-12 1000 MCG tablet Take 1 tablet (1,000 mcg total) by mouth daily. 180 tablet 1 Past Month at Unknown time  . diclofenac Sodium (VOLTAREN) 1 % GEL Apply 1 application topically 4 (four) times daily as needed (pain). (Patient not taking: Reported on 07/22/2020)   Not Taking at prn    Assessment: Pharmacy consulted to dose heparin in this 74 year old male admitted with DVT.   Pt was originally ordered therapeutic lovenox but has been switched to UFH in anticipation of vascular surgery. S/p thrombectomy 11/3  11/3 2119 HL 0.19  11/4 0743 HL 0.43  11/4 1620 HL 0.64 11/5 0530 HL 0.51   Goal of Therapy:  Heparin level 0.3-0.7 units/ml Monitor platelets by anticoagulation protocol: Yes   Plan: 11/5:  HL @ 0530 = 0.51 Will continue pt on current rate and recheck HL on 11/6 with AM labs.    Jakarri Lesko D, PharmD 07/24/2020 6:28 AM

## 2020-07-24 NOTE — Progress Notes (Signed)
Occupational Therapy Treatment Patient Details Name: Dwayne Thompson MRN: [id number] DOB: Aug 06, 1946 Today's Date: 07/24/2020    History of present illness Pt is a 74 y.o. male presenting to hospital 11/2. 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  Pt presenting to ED "with worsening shortness of breath, hypoxia, tachycardia and tachypnea".  Pt admitted with R sided PE with R heart strain, B LE DVT, extensive small cell lung CA (chronic Pleurex catheter in place), severe sepsis secondary post obstructive PNA L lower lobe, and acute on chronic hypoxemia respiratory failure.  S/p mechanical thrombectomy 11/2.   OT comments  Pt seen for OT tx this date. Pt seated EOB with spouse present upon OT's arrival, agreeable to session. Pt expressed concern regarding DME recommendations. Pt/spouse educated in recommendations and benefits of equipment. Pt verbalized understanding, requesting to speak with case manager today regarding additional clarifying questions regarding DME and HH needs. CM notified. Pt transferred from EOB>recliner with CGA and pt reaching for arm rest for stability; pt endorsed feeling slightly SOB with effort and then noted "discomfort" in chest/sternal area briefly but resolved after sitting back down. Pt attempted further mobility to increase activity tolerance and monitor cardiopulmonary status but pt was only able to take a couple steps with UE support before requesting to sit back down 2/2 SOB. SpO2 dropped to 91%, cues for PLB and improved back up to 96% on 3L O2, HR up to 110's with exertion, back down to low 90's at rest. RN notified. Pt left in recliner, all needs in reach. Pt continues to benefit from skilled OT services. Continue to recommend HHOT at this time.    Follow  Up Recommendations  Home health OT;Supervision/Assistance - 24 hour    Equipment Recommendations  3 in 1 bedside commode;Wheelchair (measurements OT);Wheelchair cushion (measurements OT);Other (comment) Management consultant)    Recommendations for Other Services      Precautions / Restrictions Precautions Precautions: Fall Precaution Comments: Monitor O2 sats; aspiration Restrictions Weight Bearing Restrictions: No       Mobility Bed Mobility               General bed mobility comments: deferred, sitting EOB upon start of session, in recliner at end of session  Transfers Overall transfer level: Needs assistance Equipment used: None Transfers: Sit to/from Stand Sit to Stand: Min guard              Balance Overall balance assessment: Needs assistance Sitting-balance support: No upper extremity supported;Feet supported Sitting balance-Leahy Scale: Good     Standing balance support: Single extremity supported Standing balance-Leahy Scale: Fair                             ADL either performed or assessed with clinical judgement   ADL                                               Vision       Perception     Praxis      Cognition Arousal/Alertness: Awake/alert Behavior During Therapy: Flat affect Overall Cognitive Status: Within Functional Limits for tasks assessed  Exercises Other Exercises Other Exercises: Max A to don socks while pt in seated position to maximize energy conservation for upcoming ADL mobility efforts Other Exercises: Pt/spouse educated in AE/DME recommendations including w/c and BSC to support pt's safety/independence, and energy conservation for ADL and ADL mobility Other Exercises: Pt transferred from EOB>recliner with CGA and pt reaching for arm rest for stability; pt endorsed feeling slightly SOB with effort and then noted "discomfort" in chest/sternal  area briefly but resolved after sitting back down. Pt attempted further mobility to increase activity tolerance and monitor cardiopulmonary status but pt was only able to take a couple steps with UE support before requesting to sit back down 2/2 SOB. SpO2 dropped to 91%, cues for PLB and improved back up to 96% on 3L O2, HR up to 110's with exertion, back down to low 90's at rest.   Shoulder Instructions       General Comments      Pertinent Vitals/ Pain       Pain Assessment:  (reports "discomfort" in chest/sternal area briefly but resolves after sitting back down.)  Home Living                                          Prior Functioning/Environment              Frequency  Min 2X/week        Progress Toward Goals  OT Goals(current goals can now be found in the care plan section)        Plan Discharge plan remains appropriate;Frequency remains appropriate    Co-evaluation                 AM-PAC OT "6 Clicks" Daily Activity     Outcome Measure   Help from another person eating meals?: None Help from another person taking care of personal grooming?: None Help from another person toileting, which includes using toliet, bedpan, or urinal?: A Little Help from another person bathing (including washing, rinsing, drying)?: A Little Help from another person to put on and taking off regular upper body clothing?: None Help from another person to put on and taking off regular lower body clothing?: A Little 6 Click Score: 21    End of Session    OT Visit Diagnosis: Other abnormalities of gait and mobility (R26.89);Muscle weakness (generalized) (M62.81)   Activity Tolerance Patient limited by fatigue   Patient Left in chair;with call bell/phone within reach;with chair alarm set;with family/visitor present   Nurse Communication Other (comment) (chest pain noted)        Time: 7543-6067 OT Time Calculation (min): 39 min  Charges: OT General  Charges $OT Visit: 1 Visit OT Treatments $Self Care/Home Management : 38-52 mins  Jeni Salles, MPH, MS, OTR/L ascom [phone] 07/24/20, 12:16 PM

## 2020-07-24 NOTE — Progress Notes (Signed)
[place]  [phone] [phone]   Name: Dwayne Thompson Date: 07/24/2020 MRN: [id number]  DOB: [birth year]  Patient Care Team: Jerrol Banana., MD as PCP - General (Family Medicine) Virgel Manifold, MD as Consulting Physician (Gastroenterology) Lequita Asal, MD as Referring Physician (Hematology and Oncology) Tyler Pita, MD as Consulting Physician (Pulmonary Disease) Dimmig, Marcello Moores, MD as Referring Physician (Orthopedic Surgery) Royston Cowper, MD as Consulting Physician (Urology) Telford Nab, RN as Oncology Nurse Navigator    REASON FOR CONSULTATION: Dwayne Thompson is a 74 y.o. 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  He was admitted to the hospital on 07/21/2020 with shortness of breath.  Patient was found to have bilateral lower extremity DVTs and extensive pulmonary emboli in all lobes of the right lung with evidence of right heart strain.  He was also noted to have worsening mediastinal and upper abdominal adenopathy and worsening cardiophrenic/abdominal wall mass on the CTA of the chest.  Patient underwent mechanical thrombectomy on 07/21/2020.  Palliative care is consulted to help address goals..    CODE STATUS: Full code  PAST MEDICAL HISTORY: Past Medical History:  Diagnosis Date  . Anemia   . Back pain   . BPH (benign prostatic hyperplasia)   . Cirrhosis ([place])   . COPD (chronic obstructive pulmonary disease) ([place])   . ED (erectile dysfunction)   . GERD (gastroesophageal reflux disease)   . Hyperlipidemia   . Hypertension     PAST SURGICAL HISTORY:  Past Surgical History:  Procedure Laterality  Date  . COLONOSCOPY Left 01/03/2018   Procedure: COLONOSCOPY;  Surgeon: Virgel Manifold, MD;  Location: [facility] ENDOSCOPY;  Service: Endoscopy;  Laterality: Left;  . COLONOSCOPY WITH PROPOFOL N/A 04/05/2018   Procedure: COLONOSCOPY WITH PROPOFOL;  Surgeon: Virgel Manifold, MD;  Location: [facility] ENDOSCOPY;  Service: Endoscopy;  Laterality: N/A;  . CYSTOSCOPY WITH STENT PLACEMENT Right 09/26/2019   Procedure: CYSTOSCOPY WITH STENT PLACEMENT;  Surgeon: Royston Cowper, MD;  Location: [facility] ORS;  Service: Urology;  Laterality: Right;  . ENTEROSCOPY  01/03/2018   Procedure: ENTEROSCOPY;  Surgeon: Virgel Manifold, MD;  Location: [facility] ENDOSCOPY;  Service: Endoscopy;;  . ESOPHAGOGASTRODUODENOSCOPY N/A 01/02/2018   Procedure: ESOPHAGOGASTRODUODENOSCOPY (EGD);  Surgeon: Virgel Manifold, MD;  Location: [facility] ENDOSCOPY;  Service: Endoscopy;  Laterality: N/A;  . ESOPHAGOGASTRODUODENOSCOPY (EGD) WITH PROPOFOL N/A 04/05/2018   Procedure: ESOPHAGOGASTRODUODENOSCOPY (EGD) WITH PROPOFOL;  Surgeon: Virgel Manifold, MD;  Location: [facility] ENDOSCOPY;  Service: Endoscopy;  Laterality: N/A;  . exploration and neurolysis of right peroneal nerve at the knee Right 08/27/2018  . GREEN LIGHT LASER TURP (TRANSURETHRAL RESECTION OF PROSTATE N/A 09/26/2019   Procedure: GREEN LIGHT LASER TURP (TRANSURETHRAL RESECTION OF PROSTATE;  Surgeon: Royston Cowper, MD;  Location: [facility] ORS;  Service: Urology;  Laterality: N/A;  . HERNIA REPAIR     1980's  . PORTACATH PLACEMENT Left 06/29/2020   Procedure: INSERTION PORT-A-CATH;  Surgeon: [name], MD;  Location: [facility] ORS;  Service: Thoracic;  Laterality: Left;  . PULMONARY THROMBECTOMY N/A 07/22/2020   Procedure: PULMONARY THROMBECTOMY;  Surgeon: Algernon Huxley, MD;  Location: [facility];  Service: Cardiovascular;  Laterality: N/A;  . VIDEO ASSISTED THORACOSCOPY (VATS)/THOROCOTOMY Left 06/29/2020  Procedure: VIDEO ASSISTED THORACOSCOPY;  Surgeon: [name],  MD;  Location: [facility] ORS;  Service: Thoracic;  Laterality: Left;    HEMATOLOGY/ONCOLOGY HISTORY:  Oncology History  Small cell lung cancer ([place])  06/27/2020 Initial Diagnosis   Small cell lung cancer ([place])   07/06/2020 -  Chemotherapy   The patient had palonosetron (ALOXI) injection 0.25 mg, 0.25 mg, Intravenous,  Once, 1 of 4 cycles Administration: 0.25 mg (07/06/2020) pegfilgrastim (NEULASTA) injection 6 mg, 6 mg, Subcutaneous, Once, 1 of 4 cycles Administration: 6 mg (07/09/2020) CARBOplatin (PARAPLATIN) 520 mg in sodium chloride 0.9 % 250 mL chemo infusion, 520 mg (100 % of original dose 523 mg), Intravenous,  Once, 1 of 4 cycles Dose modification:   (original dose 523 mg, Cycle 1) Administration: 520 mg (07/06/2020) etoposide (VEPESID) 140 mg in sodium chloride 0.9 % 500 mL chemo infusion, 65 mg/m2 = 140 mg (65 % of original dose 100 mg/m2), Intravenous,  Once, 1 of 4 cycles Dose modification: 70 mg/m2 (original dose 100 mg/m2, Cycle 1, Reason: Provider Judgment, Comment: low albumen), 65 mg/m2 (original dose 100 mg/m2, Cycle 1, Reason: Provider Judgment, Comment: low albumen) Administration: 140 mg (07/06/2020), 140 mg (07/07/2020), 140 mg (07/08/2020) fosaprepitant (EMEND) 150 mg in sodium chloride 0.9 % 145 mL IVPB, 150 mg, Intravenous,  Once, 1 of 4 cycles Administration: 150 mg (07/06/2020) atezolizumab (TECENTRIQ) 1,200 mg in sodium chloride 0.9 % 250 mL chemo infusion, 1,200 mg, Intravenous, Once, 1 of 8 cycles Administration: 1,200 mg (07/06/2020)  for chemotherapy treatment.      ALLERGIES:  is allergic to indomethacin, tamsulosin hcl, and sulfa antibiotics.  MEDICATIONS:  Current Facility-Administered Medications  Medication Dose Route Frequency Provider Last Rate Last Admin  . 0.9 %  sodium chloride infusion  250 mL Intravenous PRN Algernon Huxley, MD   Stopped at 07/22/20 2300  . acetaminophen (TYLENOL) tablet 650 mg  650 mg Oral Q6H PRN Oswald Hillock, RPH      .  Chlorhexidine Gluconate Cloth 2 % PADS 6 each  6 each Topical Daily Algernon Huxley, MD   6 each at 07/24/20 1030  . dutasteride (AVODART) capsule 0.5 mg  0.5 mg Oral Daily Algernon Huxley, MD   0.5 mg at 07/24/20 0947  . feeding supplement (ENSURE ENLIVE / ENSURE PLUS) liquid 237 mL  237 mL Oral TID BM Dessa Phi, DO   237 mL at 07/24/20 0946  . heparin ADULT infusion 100 units/mL (25000 units/242mL sodium chloride 0.45%)  1,700 Units/hr Intravenous Continuous Dessa Phi, DO 17 mL/hr at 07/24/20 1243 1,700 Units/hr at 07/24/20 1243  . ipratropium-albuterol (DUONEB) 0.5-2.5 (3) MG/3ML nebulizer solution 3 mL  3 mL Nebulization Q4H PRN Dessa Phi, DO      . megestrol (MEGACE) 40 MG/ML suspension 200 mg  200 mg Oral Daily Algernon Huxley, MD   200 mg at 07/24/20 0946  . midodrine (PROAMATINE) tablet 10 mg  10 mg Oral QID Algernon Huxley, MD   10 mg at 07/24/20 1240  . multivitamin with minerals tablet 1 tablet  1 tablet Oral Daily Dessa Phi, DO   1 tablet at 07/24/20 0946  . oxyCODONE (Oxy IR/ROXICODONE) immediate release tablet 15 mg  15 mg Oral Q4H PRN Algernon Huxley, MD   15 mg at 07/24/20 0718  . pantoprazole (PROTONIX) EC tablet 20 mg  20 mg Oral BID Algernon Huxley, MD   20 mg at 07/24/20 0947  . piperacillin-tazobactam (ZOSYN) IVPB 3.375 g  3.375 g  Intravenous Q8H Dessa Phi, DO 12.5 mL/hr at 07/24/20 0524 3.375 g at 07/24/20 0524  . rosuvastatin (CRESTOR) tablet 10 mg  10 mg Oral Daily Algernon Huxley, MD   10 mg at 07/24/20 0946  . sodium chloride flush (NS) 0.9 % injection 10-40 mL  10-40 mL Intracatheter Q12H Algernon Huxley, MD   10 mL at 07/24/20 1030  . sodium chloride flush (NS) 0.9 % injection 10-40 mL  10-40 mL Intracatheter PRN Algernon Huxley, MD   10 mL at 07/22/20 1726  . sodium chloride flush (NS) 0.9 % injection 3 mL  3 mL Intravenous Q12H Algernon Huxley, MD   3 mL at 07/24/20 0953  . sodium chloride flush (NS) 0.9 % injection 3 mL  3 mL Intravenous PRN Algernon Huxley, MD        Facility-Administered Medications Ordered in Other Encounters  Medication Dose Route Frequency Provider Last Rate Last Admin  . sodium chloride flush (NS) 0.9 % injection 10 mL  10 mL Intracatheter PRN Lequita Asal, MD   10 mL at 07/06/20 0808    VITAL SIGNS: BP 92/66 (BP Location: Left Arm)   Pulse 86   Temp 97.7 F (36.5 C)   Resp 18   Ht 5\' 11"  (1.803 m)   Wt 174 lb 2.6 oz (79 kg)   SpO2 100%   BMI 24.29 kg/m  Filed Weights   07/22/20 1110 07/23/20 0154 07/24/20 0456  Weight: 176 lb 5.9 oz (80 kg) 175 lb 14.8 oz (79.8 kg) 174 lb 2.6 oz (79 kg)    Estimated body mass index is 24.29 kg/m as calculated from the following:   Height as of this encounter: 5\' 11"  (1.803 m).   Weight as of this encounter: 174 lb 2.6 oz (79 kg).  LABS: CBC:    Component Value Date/Time   WBC 23.3 (H) 07/24/2020 0530   HGB 7.9 (L) 07/24/2020 0530   HGB 12.9 (L) 06/17/2020 1316   HCT 23.6 (L) 07/24/2020 0530   HCT 38.4 06/17/2020 1316   PLT 147 (L) 07/24/2020 0530   PLT 174 06/17/2020 1316   MCV 86.8 07/24/2020 0530   MCV 92 06/17/2020 1316   NEUTROABS 15.5 (H) 07/22/2020 0400   NEUTROABS 6.7 06/17/2020 1316   LYMPHSABS 1.3 07/22/2020 0400   LYMPHSABS 0.9 06/17/2020 1316   MONOABS 1.2 (H) 07/22/2020 0400   EOSABS 0.0 07/22/2020 0400   EOSABS 0.2 06/17/2020 1316   BASOSABS 0.1 07/22/2020 0400   BASOSABS 0.0 06/17/2020 1316   Comprehensive Metabolic Panel:    Component Value Date/Time   NA 133 (L) 07/24/2020 0530   NA 138 06/17/2020 1316   K 3.9 07/24/2020 0530   CL 103 07/24/2020 0530   CO2 20 (L) 07/24/2020 0530   BUN 13 07/24/2020 0530   BUN 51 (H) 06/17/2020 1316   CREATININE 0.85 07/24/2020 0530   GLUCOSE 100 (H) 07/24/2020 0530   CALCIUM 8.6 (L) 07/24/2020 0530   AST 36 07/06/2020 0809   ALT 39 07/06/2020 0809   ALKPHOS 56 07/06/2020 0809   BILITOT 0.5 07/06/2020 0809   BILITOT 0.3 06/17/2020 1316   PROT 6.5 07/06/2020 0809   PROT 6.2 06/17/2020 1316    ALBUMIN 2.7 (L) 07/06/2020 0809   ALBUMIN 3.4 (L) 06/17/2020 1316    RADIOGRAPHIC STUDIES: DG Chest 2 View  Result Date: 07/21/2020 CLINICAL DATA:  Pleural effusion, PleurX catheter in place. EXAM: CHEST - 2 VIEW COMPARISON:  July 13, 2020. FINDINGS:  Similar appearance of a small partially loculated left pleural effusion with overlying airspace opacities. Similar more subtle opacities in the left upper lobe. Similar position of a left PleurX catheter. The right lung is clear. No visible pneumothorax. Similar cardiomediastinal silhouette, partially obscured. Left subclavian approach Port-A-Cath in similar position. No acute osseous abnormality. IMPRESSION: 1. Similar appearance of a small partially loculated left pleural effusion with overlying airspace opacities. Similar position of a left PleurX catheter. 2. Similar more subtle opacities in the left upper lobe. Electronically Signed   By: Margaretha Sheffield MD   On: 07/21/2020 14:26   DG Chest 2 View  Result Date: 07/21/2020 CLINICAL DATA:  Shortness of breath. EXAM: CHEST - 2 VIEW COMPARISON:  July 20, 2020. FINDINGS: Similar appearance of a small partially loculated left pleural effusion with overlying airspace opacities. Similar more subtle opacities in the left upper lobe. Similar position of a left PleurX catheter. The right lung is clear. No visible pneumothorax. Similar cardiomediastinal silhouette, partially obscured. Left subclavian approach Port-A-Cath in similar position. No acute osseous abnormality. IMPRESSION: 1. Similar appearance of a small partially loculated left pleural effusion with overlying airspace opacities. Similar position of a left PleurX catheter. 2. Similar more subtle opacities in the left upper lobe. Electronically Signed   By: Margaretha Sheffield MD   On: 07/21/2020 14:25   DG Chest 2 View  Result Date: 07/13/2020 CLINICAL DATA:  Pleural effusion EXAM: CHEST - 2 VIEW COMPARISON:  June 29, 2020 FINDINGS: PleurX  catheter again noted on the left with tip directed posteriorly. There is partially loculated left pleural effusion. There is airspace consolidation throughout mid and lower lung regions on the left. The right lung is clear. Heart is slightly enlarged with pulmonary vascularity normal. No adenopathy. No pneumothorax. There is bullous disease in the extreme right apex. No bone lesions. Port-A-Cath tip in superior vena cava. IMPRESSION: PleurX catheter present. Left pleural effusion appears similar on the left, partially loculated. Airspace opacity throughout the mid and lower lung regions. Slightly less opacity in the left upper lobe compared to most recent study. Right lung essentially clear. Stable cardiac silhouette. Electronically Signed   By: Lowella Grip III M.D.   On: 07/13/2020 10:10   CT ANGIO CHEST PE W OR WO CONTRAST  Result Date: 07/22/2020 CLINICAL DATA:  Shortness of breath, lung cancer EXAM: CT ANGIOGRAPHY CHEST WITH CONTRAST TECHNIQUE: Multidetector CT imaging of the chest was performed using the standard protocol during bolus administration of intravenous contrast. Multiplanar CT image reconstructions and MIPs were obtained to evaluate the vascular anatomy. CONTRAST:  45mL OMNIPAQUE IOHEXOL 350 MG/ML SOLN COMPARISON:  06/05/2020 FINDINGS: Cardiovascular: Filling defects seen within multiple right pulmonary arteries in all lobes of the right lung compatible with pulmonary emboli. No definite pulmonary emboli on the left. RV: LV ratio is mildly elevated at 1.0. Heart is mildly enlarged. 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 Coronary artery and aortic calcifications. Mediastinum/Nodes: Enlarging prevascular lymph nodes since prior study, measuring up to 10 mm currently, not measurable on prior study. Subcarinal adenopathy has increased, measuring 2 cm in short axis diameter compared to 1.5 cm previously. Abnormal left hilar lymph nodes, increasing since prior study. Lungs/Pleura:  Small loculated pleural effusion again noted, similar prior study with PleurX catheter in place. Pleural soft tissue masses are noted throughout the left pleural space. Moderate emphysema. No confluent opacity or effusion on the right. Masslike area of airspace disease/consolidation sound in the left lower lobe, encases the left lower  lobe pulmonary arteries and measures 6.6 x 5.6 cm. Upper Abdomen: Bulky mass anteriorly in the left cardiophrenic region and extending between to left ribs has enlarged, measuring 4.9 x 3.9 cm compared to 4.4 x 3.2 cm previously. Small hypodensities throughout the liver compatible with cysts. Gastrohepatic ligament adenopathy worsened with short axis diameter measuring up 2 1.5 cm compared with 1.2 cm previously. Musculoskeletal: Left Port-A-Cath in place. No acute bony abnormality. Review of the MIP images confirms the above findings. IMPRESSION: Pulmonary emboli seen within all lobes of the right lung. Slightly elevated RV: LV ratio compatible with right heart strain. Worsening masslike consolidation in the left lower lobe. Pleural nodularity noted throughout the left pleural space with small loculated left pleural effusion. Worsening mediastinal and upper abdominal adenopathy. Worsening left anterior cardiophrenic/abdominal wall mass which extends between 2 of the left ribs. Coronary artery disease. Aortic Atherosclerosis (ICD10-I70.0) and Emphysema (ICD10-J43.9). These results were called by telephone at the time of interpretation on 07/22/2020 at 1:05 am to provider Rufina Falco , who verbally acknowledged these results. Electronically Signed   By: Rolm Baptise M.D.   On: 07/22/2020 01:06   MR BRAIN W WO CONTRAST  Result Date: 06/29/2020 CLINICAL DATA:  Initial evaluation for small cell lung cancer, staging. EXAM: MRI HEAD WITHOUT AND WITH CONTRAST TECHNIQUE: Multiplanar, multiecho pulse sequences of the brain and surrounding structures were obtained without and with  intravenous contrast. CONTRAST:  7.48mL GADAVIST GADOBUTROL 1 MMOL/ML IV SOLN COMPARISON:  Prior MRI from 03/04/2013. FINDINGS: Brain: Examination degraded by motion artifact. Age-related cerebral atrophy with mild-to-moderate chronic microvascular ischemic disease. No acute intracranial infarct. Gray-white matter differentiation maintained. No encephalomalacia to suggest chronic cortical infarction. No foci of susceptibility artifact to suggest acute or chronic intracranial hemorrhage. No mass lesion, midline shift or mass effect. No hydrocephalus or extra-axial fluid collection. Pituitary gland suprasellar region normal. Midline structures intact. No abnormal enhancement or findings to suggest intracranial metastatic disease. Vascular: Major intracranial vascular flow voids are maintained. Skull and upper cervical spine: Craniocervical junction within normal limits. No focal marrow replacing lesion. Multilevel degenerative spondylolysis noted within the upper cervical spine. No scalp soft tissue abnormality. Sinuses/Orbits: Globes and orbital soft tissues demonstrate no acute finding. Paranasal sinuses are largely clear. No mastoid effusion. Inner ear structures grossly normal. Other: None. IMPRESSION: 1. No evidence for intracranial metastatic disease. 2. No other acute intracranial abnormality. 3. Mild to moderate chronic microvascular ischemic disease. Electronically Signed   By: Jeannine Boga M.D.   On: 06/29/2020 03:01   MR THORACIC SPINE W WO CONTRAST  Result Date: 06/29/2020 CLINICAL DATA:  Initial evaluation for non-small cell lung cancer, pretreatment staging. Abnormality T11-12. EXAM: MRI THORACIC WITHOUT AND WITH CONTRAST TECHNIQUE: Multiplanar and multiecho pulse sequences of the thoracic spine were obtained without and with intravenous contrast. CONTRAST:  7.42mL GADAVIST GADOBUTROL 1 MMOL/ML IV SOLN COMPARISON:  Prior PET-CT from 06/12/2020. FINDINGS: MRI THORACIC SPINE FINDINGS Alignment:  Vertebral bodies normally aligned with preservation of the normal thoracic kyphosis. No listhesis. Vertebrae: Signal intensity within the visualized bone marrow is diffusely heterogeneous. A left paraspinous soft tissue mass is seen along the left anterolateral aspect of the vertebral column extending from approximately T9-10 through T12-L1 (series 20, image 18). Evidence for invasion/involvement of the adjacent T10 and T11 vertebral bodies (series 21, image 14). Tumor partially extends into the left neural foramen at T11-12 (series 40, image 34). No other significant extension into the spinal canal or epidural tumor. A separate 13 mm metastasis seen  involving the central/right aspect of the T10 vertebral body (series 39, image 8). Probable additional 13 mm metastatic lesion seen at T1 (series 20, image 9). 1 cm lesion seen within the right pedicle of T6 (series 21, image 6). No associated pathologic fracture. Cord:  Normal signal and morphology.  No significant epidural tumor. Paraspinal and other soft tissues: Paraspinous tumor as above. Paraspinous soft tissues demonstrate no other acute finding. Irregular and partially loculated left pleural effusion with scattered fluid-fluid levels partially visualized at the left lung base. Consolidative changes noted throughout the posterior left lung superiorly. Disc levels: Normal expected multilevel degenerative disc desiccation seen within the thoracic spine. Mild disc bulging noted at the T7-8 level without significant spinal stenosis. Multilevel facet hypertrophy throughout the lower thoracic spine. No significant stenosis or foraminal encroachment. IMPRESSION: 1. 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 Tumor partially extends into the left neural foramen at T11-12. No other significant extension into the spinal canal or epidural tumor. 2. Additional metastatic  lesions involving the T1 and T10 vertebral bodies as well as the right pedicle of T6. No associated pathologic fracture. 3. Heterogeneous and partially loculated left pleural effusion with consolidation signal intensity throughout the visualized left lower lung. Electronically Signed   By: Jeannine Boga M.D.   On: 06/29/2020 03:18   PERIPHERAL VASCULAR CATHETERIZATION  Result Date: 07/22/2020 See op note  US Venous Img Lower Bilateral (DVT)  Result Date: 07/21/2020 CLINICAL DATA:  Lower extremity edema EXAM: BILATERAL LOWER EXTREMITY VENOUS DOPPLER ULTRASOUND TECHNIQUE: Gray-scale sonography with graded compression, as well as color Doppler and duplex ultrasound were performed to evaluate the lower extremity deep venous systems from the level of the common femoral vein and including the common femoral, femoral, profunda femoral, popliteal and calf veins including the posterior tibial, peroneal and gastrocnemius veins when visible. The superficial great saphenous vein was also interrogated. Spectral Doppler was utilized to evaluate flow at rest and with distal augmentation maneuvers in the common femoral, femoral and popliteal veins. COMPARISON:  None. FINDINGS: RIGHT LOWER EXTREMITY Common Femoral Vein: No evidence of thrombus. Normal compressibility, respiratory phasicity and response to augmentation. Saphenofemoral Junction: No evidence of thrombus. Normal compressibility and flow on color Doppler imaging. Profunda Femoral Vein: No evidence of thrombus. Normal compressibility and flow on color Doppler imaging. Femoral Vein: No evidence of thrombus. Normal compressibility, respiratory phasicity and response to augmentation. Popliteal Vein: Thrombus is noted with decreased compressibility. Calf Veins: Thrombus is noted within the posterior tibial and peroneal veins with decreased compressibility. Superficial Great Saphenous Vein: No evidence of thrombus. Normal compressibility. Venous Reflux:  None.  Other Findings:  None. LEFT LOWER EXTREMITY Common Femoral Vein: No evidence of thrombus. Normal compressibility, respiratory phasicity and response to augmentation. Saphenofemoral Junction: No evidence of thrombus. Normal compressibility and flow on color Doppler imaging. Profunda Femoral Vein: No evidence of thrombus. Normal compressibility and flow on color Doppler imaging. Femoral Vein: No evidence of thrombus. Normal compressibility, respiratory phasicity and response to augmentation. Popliteal Vein: Thrombus is noted with decreased compressibility. Calf Veins: Peroneal vein demonstrates thrombus with decreased compressibility. Superficial Great Saphenous Vein: No evidence of thrombus. Normal compressibility. Venous Reflux:  None. Other Findings:  None. IMPRESSION: Bilateral deep venous thrombosis involving the popliteal and calf veins. Electronically Signed   By: Inez Catalina M.D.   On: 07/21/2020 18:06   DG Chest Port 1 View  Result Date: 06/29/2020 CLINICAL DATA:  Status  post Port-A-Cath placement. EXAM: PORTABLE CHEST 1 VIEW COMPARISON:  June 25, 2020. FINDINGS: Stable cardiomegaly. Interval placement of left subclavian Port-A-Cath with distal tip in expected position of cavoatrial junction. No pneumothorax pleural effusion is noted. Right lung is clear. Left pleural effusion is significantly smaller status post pleural drainage catheter placement. Diffuse left lung opacity is noted concerning for atelectasis or possibly inflammation. Bony thorax is unremarkable. IMPRESSION: Interval placement of left subclavian Port-A-Cath with distal tip in expected position of cavoatrial junction. Left pleural effusion is significantly smaller status post pleural drainage catheter placement. Electronically Signed   By: Marijo Conception M.D.   On: 06/29/2020 13:30   DG Chest Port 1 View  Result Date: 06/25/2020 CLINICAL DATA:  Chest tube. EXAM: PORTABLE CHEST 1 VIEW COMPARISON:  06/24/2020. FINDINGS: Left chest  tube in stable position. No pneumothorax. Prominent skin folds again noted on the right. Persistent left base atelectasis/infiltrate. Small left pleural effusion. No pneumothorax. Cardiomegaly. Degenerative changes scoliosis thoracic spine. IMPRESSION: 1.  Left chest tube in stable position. No pneumothorax. 2. Persistent left base atelectasis/infiltrate. Small left pleural effusion. Similar findings on prior exam. Electronically Signed   By: Marcello Moores  Register   On: 06/25/2020 05:01   DG C-Arm 1-60 Min-No Report  Result Date: 06/29/2020 Fluoroscopy was utilized by the requesting physician.  No radiographic interpretation.   ECHOCARDIOGRAM COMPLETE  Result Date: 07/22/2020    ECHOCARDIOGRAM REPORT   Patient Name:   Dwayne Thompson Date of Exam: 07/22/2020 Medical Rec #:  [id number]    Height:       71.0 in Accession #:    [id number]   Weight:       176.4 lb Date of Birth:  28-Jan-1946     BSA:          1.999 m Patient Age:    68 years     BP:           106/67 mmHg Patient Gender: M            HR:           104 bpm. Exam Location:  [facility] Procedure: 2D Echo, Cardiac Doppler and Color Doppler Indications:     Shortness of breath, DVT  History:         Patient has no prior history of Echocardiogram examinations.                  COPD; Risk Factors:Hypertension and Dyslipidemia.  Sonographer:     Sherrie Sport RDCS (AE) Referring Phys:  [place] Diagnosing Phys: Kate Sable MD IMPRESSIONS  1. Left ventricular ejection fraction, by estimation, is 65 to 70%. The left ventricle has normal function. The left ventricle has no regional wall motion abnormalities. Left ventricular diastolic parameters are consistent with Grade I diastolic dysfunction (impaired relaxation).  2. Right ventricular systolic function is normal. The right ventricular size is normal. There is moderately elevated pulmonary artery systolic pressure.  3. The mitral valve is normal in structure. No evidence of mitral valve regurgitation.  4.  The aortic valve is normal in structure. Aortic valve regurgitation is not visualized.  5. The inferior vena cava is normal in size with greater than 50% respiratory variability, suggesting right atrial pressure of 3 mmHg. FINDINGS  Left Ventricle: Left ventricular ejection fraction, by estimation, is 65 to 70%. The left ventricle has normal function. The left ventricle has no regional wall motion abnormalities. The left ventricular internal cavity size was normal in  size. There is  borderline left ventricular hypertrophy. Left ventricular diastolic parameters are consistent with Grade I diastolic dysfunction (impaired relaxation). Right Ventricle: The right ventricular size is normal. No increase in right ventricular wall thickness. Right ventricular systolic function is normal. There is moderately elevated pulmonary artery systolic pressure. The tricuspid regurgitant velocity is 3.49 m/s, and with an assumed right atrial pressure of 3 mmHg, the estimated right ventricular systolic pressure is 61.6 mmHg. Left Atrium: Left atrial size was normal in size. Right Atrium: Right atrial size was normal in size. Pericardium: There is no evidence of pericardial effusion. Mitral Valve: The mitral valve is normal in structure. No evidence of mitral valve regurgitation. Tricuspid Valve: The tricuspid valve is normal in structure. Tricuspid valve regurgitation is not demonstrated. Aortic Valve: The aortic valve is normal in structure. Aortic valve regurgitation is not visualized. Aortic valve mean gradient measures 4.0 mmHg. Aortic valve peak gradient measures 7.8 mmHg. Aortic valve area, by VTI measures 3.46 cm. Pulmonic Valve: The pulmonic valve was not well visualized. Pulmonic valve regurgitation is not visualized. Aorta: The aortic root is normal in size and structure. Venous: The inferior vena cava is normal in size with greater than 50% respiratory variability, suggesting right atrial pressure of 3 mmHg. IAS/Shunts: No  atrial level shunt detected by color flow Doppler.  LEFT VENTRICLE PLAX 2D LVIDd:         3.73 cm  Diastology LVIDs:         2.66 cm  LV e' medial:    3.59 cm/s LV PW:         1.08 cm  LV E/e' medial:  25.4 LV IVS:        1.08 cm  LV e' lateral:   7.83 cm/s LVOT diam:     2.10 cm  LV E/e' lateral: 11.6 LV SV:         77 LV SV Index:   38 LVOT Area:     3.46 cm  RIGHT VENTRICLE RV Basal diam:  3.19 cm RV S prime:     14.90 cm/s TAPSE (M-mode): 3.6 cm LEFT ATRIUM             Index       RIGHT ATRIUM           Index LA diam:        3.20 cm 1.60 cm/m  RA Area:     18.40 cm LA Vol (A2C):   60.9 ml 30.47 ml/m RA Volume:   51.00 ml  25.51 ml/m LA Vol (A4C):   41.9 ml 20.96 ml/m LA Biplane Vol: 53.3 ml 26.67 ml/m  AORTIC VALVE                   PULMONIC VALVE AV Area (Vmax):    2.63 cm    PV Vmax:        0.34 m/s AV Area (Vmean):   2.74 cm    PV Peak grad:   0.4 mmHg AV Area (VTI):     3.46 cm    RVOT Peak grad: 2 mmHg AV Vmax:           139.50 cm/s AV Vmean:          93.750 cm/s AV VTI:            0.222 m AV Peak Grad:      7.8 mmHg AV Mean Grad:      4.0 mmHg LVOT Vmax:  106.00 cm/s LVOT Vmean:        74.200 cm/s LVOT VTI:          0.222 m LVOT/AV VTI ratio: 1.00  AORTA Ao Root diam: 3.30 cm MITRAL VALVE                TRICUSPID VALVE MV Area (PHT): 3.60 cm     TR Peak grad:   48.7 mmHg MV Decel Time: 211 msec     TR Vmax:        349.00 cm/s MV E velocity: 91.20 cm/s MV A velocity: 139.00 cm/s  SHUNTS MV E/A ratio:  0.66         Systemic VTI:  0.22 m                             Systemic Diam: 2.10 cm Kate Sable MD Electronically signed by Kate Sable MD Signature Date/Time: 07/22/2020/12:42:21 PM    Final     PERFORMANCE STATUS (ECOG) : 3 - Symptomatic, >50% confined to bed  Review of Systems Unless otherwise noted, a complete review of systems is negative.  Physical Exam General: NAD Pulmonary: Unlabored Extremities: no edema, no joint deformities Skin: no rashes Neurological:  Weakness but otherwise nonfocal  IMPRESSION: Follow-up visit.  Patient was sitting in the chair.  He reports improved shortness of breath today.  His appetite remains poor.  His wife was at bedside.  I note the patient reversed his CODE STATUS last night.  I attempted to clarify his desire for end-of-life care.  He seemed depressed with the question.  We discussed the probable futility associated with resuscitative efforts in the setting of advanced cancer.  He says he wants to address that after discharging from the hospital.  He wants to speak with his children about decision-making.  PLAN: -Full code/full scope -Will follow   Time Total: 25 minutes  Visit consisted of counseling and education dealing with the complex and emotionally intense issues of symptom management and palliative care in the setting of serious and potentially life-threatening illness.Greater than 50%  of this time was spent counseling and coordinating care related to the above assessment and plan.  Signed by: [name], PhD, NP-C

## 2020-07-24 NOTE — Progress Notes (Signed)
PROGRESS NOTE    Dwayne Thompson  OZD:[id number] DOB: October 14, 1945 DOA: 07/21/2020 PCP: Jerrol Banana., MD     Brief Narrative:  Dwayne Thompson is a 74 y.o. male with medical history significant of COPD, HTN, cirrhosis, small cell lung cancer diagnosed October 2021. Pt just had one round of chemo therapy. Per reports, SOB worsening for past few days and is only on prn oxygen.  In the emergency department, work-up revealed right-sided pulmonary embolism with bilateral lower extremity DVT.  Vascular surgery was consulted for pulmonary thrombectomy and oncology was consulted as well.  Patient underwent mechanical thrombectomy of the right pulmonary arteries by vascular surgery Dr. Lucky Cowboy on 11/3.  New events last 24 hours / Subjective: Patient sitting at the side of bed, breathing better today compared to yesterday. Denies any new complaints. No report of blood loss  Assessment & Plan:   Principal Problem:   Acute on chronic respiratory failure with hypoxia (HCC) Active Problems:   BPH with obstruction/lower urinary tract symptoms   Small cell lung cancer (HCC)   COPD (chronic obstructive pulmonary disease) (HCC)   Pulmonary embolism (HCC)   Palliative care encounter   Protein-calorie malnutrition, severe   Pulmonary embolism, bilateral lower extremity DVT -S/p right pulmonary artery thrombectomy by Dr. Lucky Cowboy 11/3 -Echocardiogram EF 65-70%, grade 1 dd -Continue IV heparin, plan to transition to Eliquis prior to discharge, discussed with Dr. 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 Consult Dr. Genevive Bi today for assistance -Oncology following -Palliative care following for goals of care conversation   Severe sepsis secondary to postobstructive pneumonia of the left lower lobe, present on admission -Presented with leukocytosis WBC 18, tachycardia, tachypnea with  respiratory failure (leukocytosis could be from recent Neulasta, however procalcitonin is also increased)  -Continue Rocephin, azithromycin --> zosyn  -MRSA PCR negative --> stop vanco   Acute on chronic hypoxemic respiratory failure  -Secondary to PE, SCLC, post-obstructive pneumonia -Currently on 4L [place] O2 (was on 2L qhs prior to admission)   BPH -Continue dutasteride   Chronic hypotension -Continue Midodrine  Hyperlipidemia -Continue Crestor  In agreement with assessment of the pressure ulcer as below:  Pressure Injury 06/23/20 Buttocks Right Stage 2 -  Partial thickness loss of dermis presenting as a shallow open injury with a red, pink wound bed without slough. 2.5 x 1.2 (Active)  06/23/20 0608  Location: Buttocks  Location Orientation: Right  Staging: Stage 2 -  Partial thickness loss of dermis presenting as a shallow open injury with a red, pink wound bed without slough.  Wound Description (Comments): 2.5 x 1.2  Present on Admission: Yes     Nutrition Problem: Severe Malnutrition Etiology: cancer and cancer related treatments, other (see comment) (COPD, cirrhosis)   DVT prophylaxis: IV heparin SCDs Start: 07/21/20 1528  Code Status: FULL Family Communication: No family at bedside, discussed with wife over the phone today Disposition Plan:  Status is: Inpatient  Remains inpatient appropriate because:IV treatments appropriate due to intensity of illness or inability to take PO and Inpatient level of care appropriate due to severity of illness    Dispo: The patient is from: Home              Anticipated d/c is to: Home              Anticipated d/c date is: 1 day  Patient currently is not medically stable to d/c.  Remains on IV heparin, IV antibiotics. Hope to deescalate and discharge in next 1-2 days    Consultants:   Vascular surgery  Oncology  Palliative care medicine  Cardiothoracic surgery   Procedures:   Pulmonary thrombectomy  11/3  Antimicrobials:  Anti-infectives (From admission, onward)   Start     Dose/Rate Route Frequency Ordered Stop   07/23/20 2000  vancomycin (VANCOREADY) IVPB 750 mg/150 mL  Status:  Discontinued        750 mg 150 mL/hr over 60 Minutes Intravenous Every 8 hours 07/23/20 1059 07/23/20 1352   07/23/20 1400  piperacillin-tazobactam (ZOSYN) IVPB 3.375 g        3.375 g 12.5 mL/hr over 240 Minutes Intravenous Every 8 hours 07/23/20 1002     07/23/20 1145  vancomycin (VANCOREADY) IVPB 2000 mg/400 mL        2,000 mg 200 mL/hr over 120 Minutes Intravenous  Once 07/23/20 1059 07/23/20 1425   07/21/20 2000  azithromycin (ZITHROMAX) 500 mg in sodium chloride 0.9 % 250 mL IVPB  Status:  Discontinued        500 mg 250 mL/hr over 60 Minutes Intravenous Every 24 hours 07/21/20 1931 07/23/20 1002   07/21/20 1945  cefTRIAXone (ROCEPHIN) 1 g in sodium chloride 0.9 % 100 mL IVPB  Status:  Discontinued        1 g 200 mL/hr over 30 Minutes Intravenous Every 24 hours 07/21/20 1931 07/23/20 1002       Objective: Vitals:   07/24/20 0456 07/24/20 0754 07/24/20 0856 07/24/20 1131  BP: (!) 92/58 (!) 95/54  92/66  Pulse: 81 81  86  Resp: 20 17  18   Temp: 98.7 F (37.1 C) 98.3 F (36.8 C)  97.7 F (36.5 C)  TempSrc: Oral Oral    SpO2: 100% 99% 97% 100%  Weight: 79 kg     Height:        Intake/Output Summary (Last 24 hours) at 07/24/2020 1133 Last data filed at 07/24/2020 1030 Gross per 24 hour  Intake 670.19 ml  Output 1200 ml  Net -529.81 ml   Filed Weights   07/22/20 1110 07/23/20 0154 07/24/20 0456  Weight: 80 kg 79.8 kg 79 kg    Examination: General exam: Appears calm and comfortable  Respiratory system: Diminished breath sound left base, without respiratory distress, without conversational dyspnea, on nasal cannula O2 Cardiovascular system: S1 & S2 heard, RRR. No pedal edema. Gastrointestinal system: Abdomen is nondistended, soft and nontender. Normal bowel sounds heard. Central  nervous system: Alert and oriented. Non focal exam. Speech clear  Extremities: Symmetric in appearance bilaterally  Skin: No rashes, lesions or ulcers on exposed skin  Psychiatry: Judgement and insight appear stable. Mood & affect appropriate.    Data Reviewed: I have personally reviewed following labs and imaging studies  CBC: Recent Labs  Lab 07/21/20 1333 07/22/20 0400 07/23/20 0500 07/24/20 0530  WBC 18.0* 19.6* 23.2* 23.3*  NEUTROABS  --  15.5*  --   --   HGB 10.2* 9.1* 7.8* 7.9*  HCT 30.6* 27.5* 23.5* 23.6*  MCV 88.2 88.7 88.7 86.8  PLT 133* 129* 125* 937*   Basic Metabolic Panel: Recent Labs  Lab 07/21/20 1333 07/23/20 0500 07/24/20 0530  NA 135 136 133*  K 3.8 4.0 3.9  CL 101 105 103  CO2 22 23 20*  GLUCOSE 125* 106* 100*  BUN 17 13 13   CREATININE 0.90 0.75 0.85  CALCIUM 8.9 8.2*  8.6*   GFR: Estimated Creatinine Clearance: 81.2 mL/min (by C-G formula based on SCr of 0.85 mg/dL). Liver Function Tests: No results for input(s): AST, ALT, ALKPHOS, BILITOT, PROT, ALBUMIN in the last 168 hours. No results for input(s): LIPASE, AMYLASE in the last 168 hours. No results for input(s): AMMONIA in the last 168 hours. Coagulation Profile: Recent Labs  Lab 07/22/20 0400  INR 1.1   Cardiac Enzymes: No results for input(s): CKTOTAL, CKMB, CKMBINDEX, TROPONINI in the last 168 hours. BNP (last 3 results) No results for input(s): PROBNP in the last 8760 hours. HbA1C: No results for input(s): HGBA1C in the last 72 hours. CBG: No results for input(s): GLUCAP in the last 168 hours. Lipid Profile: No results for input(s): CHOL, HDL, LDLCALC, TRIG, CHOLHDL, LDLDIRECT in the last 72 hours. Thyroid Function Tests: No results for input(s): TSH, T4TOTAL, FREET4, T3FREE, THYROIDAB in the last 72 hours. Anemia Panel: No results for input(s): VITAMINB12, FOLATE, FERRITIN, TIBC, IRON, RETICCTPCT in the last 72 hours. Sepsis Labs: Recent Labs  Lab 07/21/20 1818  07/21/20 2349 07/22/20 0400 07/23/20 0500  PROCALCITON  --  <0.10 0.19 0.26  LATICACIDVEN 2.4*  --   --   --     Recent Results (from the past 240 hour(s))  Respiratory Panel by RT PCR (Flu A&B, Covid) - Nasopharyngeal Swab     Status: None   Collection Time: 07/21/20  4:12 PM   Specimen: Nasopharyngeal Swab  Result Value Ref Range Status   SARS Coronavirus 2 by RT PCR NEGATIVE NEGATIVE Final    Comment: (NOTE) SARS-CoV-2 target nucleic acids are NOT DETECTED.  The SARS-CoV-2 RNA is generally detectable in upper respiratoy specimens during the acute phase of infection. The lowest concentration of SARS-CoV-2 viral copies this assay can detect is 131 copies/mL. A negative result does not preclude SARS-Cov-2 infection and should not be used as the sole basis for treatment or other patient management decisions. A negative result may occur with  improper specimen collection/handling, submission of specimen other than nasopharyngeal swab, presence of viral mutation(s) within the areas targeted by this assay, and inadequate number of viral copies (<131 copies/mL). A negative result must be combined with clinical observations, patient history, and epidemiological information. The expected result is Negative.  Fact Sheet for Patients:  PinkCheek.be  Fact Sheet for Healthcare Providers:  GravelBags.it  This test is no t yet approved or cleared by the [place] FDA and  has been authorized for detection and/or diagnosis of SARS-CoV-2 by FDA under an Emergency Use Authorization (EUA). This EUA will remain  in effect (meaning this test can be used) for the duration of the COVID-19 declaration under Section 564(b)(1) of the Act, 21 U.S.C. section 360bbb-3(b)(1), unless the authorization is terminated or revoked sooner.     Influenza A by PCR NEGATIVE NEGATIVE Final   Influenza B by PCR NEGATIVE NEGATIVE Final    Comment:  (NOTE) The Xpert Xpress SARS-CoV-2/FLU/RSV assay is intended as an aid in  the diagnosis of influenza from Nasopharyngeal swab specimens and  should not be used as a sole basis for treatment. Nasal washings and  aspirates are unacceptable for Xpert Xpress SARS-CoV-2/FLU/RSV  testing.  Fact Sheet for Patients: PinkCheek.be  Fact Sheet for Healthcare Providers: GravelBags.it  This test is not yet approved or cleared by the [place] FDA and  has been authorized for detection and/or diagnosis of SARS-CoV-2 by  FDA under an Emergency Use Authorization (EUA). This EUA will remain  in effect (meaning  this test can be used) for the duration of the  Covid-19 declaration under Section 564(b)(1) of the Act, 21  U.S.C. section 360bbb-3(b)(1), unless the authorization is  terminated or revoked. Performed at [facility], [address] [place], [place] [address]   MRSA PCR Screening     Status: None   Collection Time: 07/23/20 11:26 AM   Specimen: Nasal Mucosa; Nasopharyngeal  Result Value Ref Range Status   MRSA by PCR NEGATIVE NEGATIVE Final    Comment:        The GeneXpert MRSA Assay (FDA approved for NASAL specimens only), is one component of a comprehensive MRSA colonization surveillance program. It is not intended to diagnose MRSA infection nor to guide or monitor treatment for MRSA infections. Performed at [facility], [address] [place], [place] [address]       Radiology Studies: PERIPHERAL VASCULAR CATHETERIZATION  Result Date: 07/22/2020 See op note     Scheduled Meds: . Chlorhexidine Gluconate Cloth  6 each Topical Daily  . dutasteride  0.5 mg Oral Daily  . feeding supplement  237 mL Oral TID BM  . megestrol  200 mg Oral Daily  . midodrine  10 mg Oral QID  . multivitamin with minerals  1 tablet Oral Daily  . pantoprazole  20 mg Oral BID  . rosuvastatin  10 mg Oral Daily   . sodium chloride flush  10-40 mL Intracatheter Q12H  . sodium chloride flush  3 mL Intravenous Q12H   Continuous Infusions: . sodium chloride Stopped (07/22/20 2300)  . heparin 1,700 Units/hr (07/23/20 2128)  . piperacillin-tazobactam (ZOSYN)  IV 3.375 g (07/24/20 0524)     LOS: 2 days      Time spent: 40 minutes   Dessa Phi, DO [facility] 07/24/2020, 11:33 AM   Available via Epic secure chat 7am-7pm After these hours, please refer to coverage provider listed on [email or web address]

## 2020-07-24 NOTE — Progress Notes (Signed)
Physical Therapy Treatment Patient Details Name: Dwayne Thompson MRN: [id number] DOB: 1946/07/14 Today's Date: 07/24/2020    History of Present Illness Pt is a 74 y.o. male presenting to hospital 11/2. 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  Pt presenting to ED "with worsening shortness of breath, hypoxia, tachycardia and tachypnea".  Pt admitted with R sided PE with R heart strain, B LE DVT, extensive small cell lung CA (chronic Pleurex catheter in place), severe sepsis secondary post obstructive PNA L lower lobe, and acute on chronic hypoxemia respiratory failure.  S/p mechanical thrombectomy 11/2.    PT Comments    Pt was sitting in recliner with family member at bedside upon arriving. He agrees to PT session with encouragement. On 4 L o2 throughout with one occasion of sao2 dropping to 90 percent. Pt was able to maintain > 90% with O2, without 4 L  O2 quickly desaturates <88%. He was able to stand to RW and ambulate ~ 120 ft . Also performed ascending/descending 4 stair with BUE support on rails with CGA for safety.  Pt tolerated session well but will continue to benefit form skilled PT to address endurance and strength deficits. Recommend HHPT to follow at DC. At conclusion of session, pt was in recliner with call bell in reach and family member at bedside.     Follow Up Recommendations  Home health PT;Supervision - Intermittent;Supervision for mobility/OOB     Equipment Recommendations  Other (comment);None recommended by PT (pt reports having his own RW)    Recommendations for Other Services       Precautions / Restrictions Precautions Precautions: Fall Precaution Comments: Monitor O2 sats; aspiration Restrictions Weight Bearing Restrictions: No    Mobility  Bed  Mobility      General bed mobility comments: pt was sitting in recliner pre/post session  Transfers Overall transfer level: Needs assistance Equipment used: Rolling walker (2 wheeled) Transfers: Sit to/from Stand Sit to Stand: Min guard;Supervision         General transfer comment: CGA on first trial STS. supervision throughout the rest. pt was on 4 L o2 throughout session. did desat to 90 but able to miantain 90 or > on 4 L  Ambulation/Gait Ambulation/Gait assistance: Min guard;Supervision Gait Distance (Feet): 120 Feet Assistive device: Rolling walker (2 wheeled) Gait Pattern/deviations: Trunk flexed Gait velocity: decreased   General Gait Details: pt has very flexed posture throughout gait traiing however no LOB or difficulty. did stop and take one standing rest to check vitals but they were stable throughout   Stairs Stairs: Yes Stairs assistance: Min guard Stair Management: Two rails;Step to pattern;Forwards Number of Stairs: 4 General stair comments: Pt was able to safely ascend/descend 4 stair with BUE support      Balance Overall balance assessment: Needs assistance Sitting-balance support: No upper extremity supported;Feet supported Sitting balance-Leahy Scale: Good Sitting balance - Comments: no LOB in sitting   Standing balance support: Bilateral upper extremity supported;During functional activity Standing balance-Leahy Scale: Fair Standing balance comment: pt reliant on RW during functional activity to prevent LOB. with BUE support, pt demonstartes much improived balance.       Cognition Arousal/Alertness: Awake/alert Behavior During Therapy: Flat affect Overall Cognitive Status: Within Functional Limits for tasks assessed      General Comments: Pt is A and  O. spouse present. needed encouragement to participate.       Exercises Other Exercises Other Exercises: Max A to don socks while pt in seated position to maximize energy conservation for  upcoming ADL mobility efforts Other Exercises: Pt/spouse educated in AE/DME recommendations including w/c and BSC to support pt's safety/independence, and energy conservation for ADL and ADL mobility Other Exercises: Pt transferred from EOB>recliner with CGA and pt reaching for arm rest for stability; pt endorsed feeling slightly SOB with effort and then noted "discomfort" in chest/sternal area briefly but resolved after sitting back down. Pt attempted further mobility to increase activity tolerance and monitor cardiopulmonary status but pt was only able to take a couple steps with UE support before requesting to sit back down 2/2 SOB. SpO2 dropped to 91%, cues for PLB and improved back up to 96% on 3L O2, HR up to 110's with exertion, back down to low 90's at rest.        Pertinent Vitals/Pain Pain Assessment: No/denies pain           PT Goals (current goals can now be found in the care plan section) Acute Rehab PT Goals Patient Stated Goal: go home Progress towards PT goals: Progressing toward goals    Frequency    Min 2X/week      PT Plan Current plan remains appropriate       AM-PAC PT "6 Clicks" Mobility   Outcome Measure  Help needed turning from your back to your side while in a flat bed without using bedrails?: None Help needed moving from lying on your back to sitting on the side of a flat bed without using bedrails?: A Little Help needed moving to and from a bed to a chair (including a wheelchair)?: A Little Help needed standing up from a chair using your arms (e.g., wheelchair or bedside chair)?: A Little Help needed to walk in hospital room?: A Little Help needed climbing 3-5 steps with a railing? : A Little 6 Click Score: 19    End of Session Equipment Utilized During Treatment: Oxygen (4 L o2 throughout, pt did not want therapist to use gait bel) Activity Tolerance: Patient tolerated treatment well Patient left: in chair;with call bell/phone within reach;with  chair alarm set;with family/visitor present Nurse Communication: Mobility status PT Visit Diagnosis: Muscle weakness (generalized) (M62.81);Other abnormalities of gait and mobility (R26.89);Difficulty in walking, not elsewhere classified (R26.2) Pain - Right/Left: Left     Time: 5427-0623 PT Time Calculation (min) (ACUTE ONLY): 26 min  Charges:  $Gait Training: 23-37 mins                     Julaine Fusi PTA 07/24/20, 3:24 PM

## 2020-07-24 NOTE — TOC Progression Note (Signed)
Transition of Care [facility]) - Progression Note    Patient Details  Name: Dwayne Thompson MRN: [id number] Date of Birth: [birth year]  Transition of Care [facility]) CM/SW Contact  Eileen Stanford, LCSW Phone Number: 07/24/2020, 9:36 AM  Clinical Narrative:   Referral for wheelchair, 3n1, and walker sent to [facility] with [employer]. Equipment to be delivered.    Expected Discharge Plan: [place] Barriers to Discharge: Continued Medical Work up  Expected Discharge Plan and Services Expected Discharge Plan: [place] Choice: [place] arrangements for the past 2 months: Single Family Home                                       Social Determinants of Health (SDOH) Interventions    Readmission Risk Interventions Readmission Risk Prevention Plan 07/23/2020  Transportation Screening Complete  Medication Review [employer]) Complete  PCP or Specialist appointment within 3-5 days of discharge Complete  HRI or [place] Complete  SW Recovery Care/Counseling Consult Complete  [place] Not Applicable  Some recent data might be hidden

## 2020-07-24 NOTE — Progress Notes (Signed)
[facility] Daily Progress Note   Subjective: 07/22/20:             1.  Contrast injection right heart             2.  Mechanical thrombectomy right main, upper lobe, middle lobe, and lower lobe pulmonary arteries             3.  Selective catheter placement right upper lobe, middle lobe, and lower lobe pulmonary arteries  Patient without complaint this AM.  No issues overnight.  Objective: Vitals:   07/23/20 2003 07/24/20 0456 07/24/20 0754 07/24/20 0856  BP:  (!) 92/58 (!) 95/54   Pulse:  81 81   Resp:  20 17   Temp:  98.7 F (37.1 C) 98.3 F (36.8 C)   TempSrc:  Oral Oral   SpO2: 97% 100% 99% 97%  Weight:  79 kg    Height:        Intake/Output Summary (Last 24 hours) at 07/24/2020 1039 Last data filed at 07/24/2020 0953 Gross per 24 hour  Intake 660.19 ml  Output 1200 ml  Net -539.81 ml   Physical Exam: A&Ox3, NAD CV: RRR Pulmonary: CTA Bilaterally Abdomen: Soft, Non-tender, Non-distended Access site: Clean and dry Vascular: Warm distally toes   Laboratory: CBC    Component Value Date/Time   WBC 23.3 (H) 07/24/2020 0530   HGB 7.9 (L) 07/24/2020 0530   HGB 12.9 (L) 06/17/2020 1316   HCT 23.6 (L) 07/24/2020 0530   HCT 38.4 06/17/2020 1316   PLT 147 (L) 07/24/2020 0530   PLT 174 06/17/2020 1316   BMET    Component Value Date/Time   NA 133 (L) 07/24/2020 0530   NA 138 06/17/2020 1316   K 3.9 07/24/2020 0530   CL 103 07/24/2020 0530   CO2 20 (L) 07/24/2020 0530   GLUCOSE 100 (H) 07/24/2020 0530   BUN 13 07/24/2020 0530   BUN 51 (H) 06/17/2020 1316   CREATININE 0.85 07/24/2020 0530   CALCIUM 8.6 (L) 07/24/2020 0530   GFRNONAA >60 07/24/2020 0530   GFRAA 59 (L) 06/23/2020 0507   Assessment/Planning: The patient is a 74 year old male with multiple medical issues status post pulmonary thrombectomy POD#1  1) we will transition over to p.o. anticoagulation when medically stable currently on heparin. 2) patient with bilateral DVT.   Will follow lower extremity DVT as an outpatient.  3) patient will need outpatient follow-up for PE  Vascular surgery to sign off Discussed with Eliot Ford PA-C 07/24/2020 10:39 AM

## 2020-07-25 DIAGNOSIS — J9621 Acute and chronic respiratory failure with hypoxia: Secondary | ICD-10-CM | POA: Diagnosis not present

## 2020-07-25 LAB — BASIC METABOLIC PANEL
Anion gap: 8 (ref 5–15)
BUN: 13 mg/dL (ref 8–23)
CO2: 22 mmol/L (ref 22–32)
Calcium: 8.4 mg/dL — ABNORMAL LOW (ref 8.9–10.3)
Chloride: 107 mmol/L (ref 98–111)
Creatinine, Ser: 0.86 mg/dL (ref 0.61–1.24)
GFR, Estimated: >60 mL/min (ref >60)
Glucose, Bld: 96 mg/dL (ref 70–99)
Potassium: 4.1 mmol/L (ref 3.5–5.1)
Sodium: 137 mmol/L (ref 135–145)

## 2020-07-25 LAB — CBC
HCT: 22.3 % — ABNORMAL LOW (ref 39.0–52.0)
Hemoglobin: 7.3 g/dL — ABNORMAL LOW (ref 13.0–17.0)
MCH: 28.7 pg (ref 26.0–34.0)
MCHC: 32.7 g/dL (ref 30.0–36.0)
MCV: 87.8 fL (ref 80.0–100.0)
Platelets: 159 10*3/uL (ref 150–400)
RBC: 2.54 MIL/uL — ABNORMAL LOW (ref 4.22–5.81)
RDW: 14.6 % (ref 11.5–15.5)
WBC: 23.3 10*3/uL — ABNORMAL HIGH (ref 4.0–10.5)
nRBC: 0 % (ref 0.0–0.2)

## 2020-07-25 LAB — HEPARIN LEVEL (UNFRACTIONATED): Heparin Unfractionated: 0.71 IU/mL — ABNORMAL HIGH (ref 0.30–0.70)

## 2020-07-25 MED ORDER — HEPARIN SOD (PORK) LOCK FLUSH 100 UNIT/ML IV SOLN
500.0000 [IU] | Freq: Once | INTRAVENOUS | Status: AC
  Administered 2020-07-25: 500 [IU] via INTRAVENOUS
  Filled 2020-07-25: qty 5

## 2020-07-25 MED ORDER — APIXABAN 5 MG PO TABS
10.0000 mg | ORAL_TABLET | Freq: Two times a day (BID) | ORAL | Status: DC
  Administered 2020-07-25: 10 mg via ORAL
  Filled 2020-07-25: qty 2

## 2020-07-25 MED ORDER — APIXABAN 5 MG PO TABS: ORAL_TABLET | ORAL | 2 refills | Status: DC

## 2020-07-25 MED ORDER — AMOXICILLIN-POT CLAVULANATE 875-125 MG PO TABS: 1.0000 | ORAL_TABLET | Freq: Two times a day (BID) | ORAL | 0 refills | Status: AC

## 2020-07-25 MED ORDER — AMOXICILLIN-POT CLAVULANATE 875-125 MG PO TABS
1.0000 | ORAL_TABLET | Freq: Two times a day (BID) | ORAL | Status: DC
  Administered 2020-07-25: 1 via ORAL
  Filled 2020-07-25: qty 1

## 2020-07-25 MED ORDER — APIXABAN (ELIQUIS) VTE STARTER PACK (10MG AND 5MG): ORAL_TABLET | ORAL | 0 refills | Status: DC

## 2020-07-25 MED ORDER — APIXABAN 5 MG PO TABS: 5.0000 mg | ORAL_TABLET | Freq: Two times a day (BID) | ORAL | Status: DC

## 2020-07-25 NOTE — Discharge Summary (Signed)
Physician Discharge Summary  Dwayne Thompson IZT:[id number] DOB: 27-Apr-1946 DOA: 07/21/2020  PCP: Jerrol Banana., MD  Admit date: 07/21/2020 Discharge date: 07/25/2020  Admitted From: Home Disposition:  Home with home health PT OT  Recommendations for Outpatient Follow-up:  1. Follow up with PCP in 1 week 2. Follow up with Oncology as scheduled 11/8 3. Follow up with cardiothoracic surgery as scheduled on 11/9 4. Follow-up with vascular surgery in 1 week  5. Continue goals of care conversation with palliative care medicine 6. Monitor CBC with leukocytosis (received Neulasta as an outpatient) as well as hemoglobin as an outpatient  Discharge Condition: Stable CODE STATUS: Full code Diet recommendation: Regular  Brief/Interim Summary: Dwayne Thompson is a 74 y.o.malewith medical history significant ofCOPD, HTN, cirrhosis, small cell lung cancer diagnosed October 2021. Pt just had one round of chemo therapy. Per reports, SOB worsening for past few days and is only on prn oxygen.  In the emergency department, work-up revealed right-sided pulmonary embolism with bilateral lower extremity DVT.  Vascular surgery was consulted for pulmonary thrombectomy and oncology was consulted as well.  Patient underwent mechanical thrombectomy of the right pulmonary arteries by vascular surgery Dr. Lucky Cowboy on 11/3.  He was started on IV heparin which was transitioned to Eliquis.  He was also started on vancomycin and Zosyn for postobstructive pneumonia and was deescalated to Augmentin on discharge.  He was weaned to 3 L nasal cannula O2 and will be on this at time of discharge.  Discharge Diagnoses:  Principal Problem:   Acute on chronic respiratory failure with hypoxia (HCC) Active Problems:   BPH with obstruction/lower urinary tract symptoms   Small cell lung cancer (HCC)   COPD (chronic obstructive pulmonary disease) (HCC)   Pulmonary embolism (HCC)   Palliative care encounter   Protein-calorie  malnutrition, severe    Pulmonary embolism, bilateral lower extremity DVT -S/p right pulmonary artery thrombectomy by Dr. Lucky Cowboy 11/3 -Echocardiogram EF 65-70%, grade 1 dd -IV heparin transition to Eliquis for discharge.  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  Discussed with Dr. Genevive Bi, he is unable to consult on the patient in the hospital but will plan to evaluate patient as scheduled on Tuesday 11/9 -Pleural effusion drained from Pleurx prior to discharge home 11/6 -Oncology following -Palliative care following for goals of care conversation   Severe sepsis secondary to postobstructive pneumonia of the left lower lobe, present on admission -Presented with leukocytosis WBC 18, tachycardia, tachypnea with respiratory failure (leukocytosis could be from recent Neulasta, however procalcitonin is also increased)  -Rocephin, azithromycin --> Vanco, zosyn --> Augmentin on discharge  Acute on chronic hypoxemic respiratory failure  -Secondary to PE, SCLC, post-obstructive pneumonia -Currently on 3L [place] O2 (was on 2L qhs prior to admission)   BPH -Continue dutasteride   Chronic hypotension -Continue Midodrine  Hyperlipidemia -Continue Crestor   In agreement with assessment of the pressure ulcer as below:  Pressure Injury 06/23/20 Buttocks Right Stage 2 -  Partial thickness loss of dermis presenting as a shallow open injury with a red, pink wound bed without slough. 2.5 x 1.2 (Active)  06/23/20 0608  Location: Buttocks  Location Orientation: Right  Staging: Stage 2 -  Partial thickness loss of dermis presenting as a shallow open injury with a red, pink wound bed without slough.  Wound Description (Comments): 2.5 x 1.2  Present on Admission: Yes    Nutrition Problem: Severe Malnutrition Etiology: cancer and cancer  related treatments, other (see comment) (COPD, cirrhosis)  Discharge Instructions  Discharge Instructions    Call MD for:   Complete by: As directed    Any sign of bleeding while on Eliquis   Call MD for:  difficulty breathing, headache or visual disturbances   Complete by: As directed    Call MD for:  extreme fatigue   Complete by: As directed    Call MD for:  persistant dizziness or light-headedness   Complete by: As directed    Call MD for:  persistant nausea and vomiting   Complete by: As directed    Call MD for:  severe uncontrolled pain   Complete by: As directed    Call MD for:  temperature >100.4   Complete by: As directed    Discharge instructions   Complete by: As directed    You were cared for by a hospitalist during your hospital stay. If you have any questions about your discharge medications or the care you received while you were in the hospital after you are discharged, you can call the unit and ask to speak with the hospitalist on call if the hospitalist that took care of you is not available. Once you are discharged, your primary care physician will handle any further medical issues. Please note that NO REFILLS for any discharge medications will be authorized once you are discharged, as it is imperative that you return to your primary care physician (or establish a relationship with a primary care physician if you do not have one) for your aftercare needs so that they can reassess your need for medications and monitor your lab values.   Increase activity slowly   Complete by: As directed    No wound care   Complete by: As directed      Allergies as of 07/25/2020      Reactions   Indomethacin Swelling   ankle swelling   Tamsulosin Hcl Hives   Sulfa Antibiotics Rash      Medication List    STOP taking these medications   diclofenac Sodium 1 % Gel Commonly known as: VOLTAREN     TAKE these medications   acetaminophen 325 MG tablet Commonly known as:  TYLENOL Take 2 tablets (650 mg total) by mouth 3 (three) times daily. What changed:   when to take this  reasons to take this   amoxicillin-clavulanate 875-125 MG tablet Commonly known as: AUGMENTIN Take 1 tablet by mouth every 12 (twelve) hours for 7 days.   Apixaban Starter Pack (10mg  and 5mg ) Commonly known as: ELIQUIS STARTER PACK Take as directed on package: start with two-5mg  tablets twice daily for 7 days. On day 8, switch to one-5mg  tablet twice daily.   cholecalciferol 25 MCG (1000 UNIT) tablet Commonly known as: VITAMIN D3 Take 1,000 Units by mouth daily.   cyanocobalamin 1000 MCG tablet Take 1 tablet (1,000 mcg total) by mouth daily.   dutasteride 0.5 MG capsule Commonly known as: AVODART Take 0.5 mg by mouth daily.   feeding supplement Liqd Take 237 mLs by mouth 2 (two) times daily between meals.   folic acid 1 MG tablet Commonly known as: FOLVITE Take 1 tablet (1 mg total) by mouth daily.   lactulose 10 GM/15ML solution Commonly known as: CHRONULAC Take 45 mLs (30 g total) by mouth 2 (two) times daily.   lidocaine 5 % Commonly known as: LIDODERM Place 1  patch onto the skin daily. Remove & Discard patch within 12 hours or as directed by MD   Lidocaine 4 % Ptch Commonly known as: Salonpas Pain Relieving Apply 1 patch topically every morning.   loratadine 10 MG tablet Commonly known as: CLARITIN Take 10 mg by mouth daily.   megestrol 40 MG/ML suspension Commonly known as: MEGACE Take 5 mLs (200 mg total) by mouth daily.   midodrine 10 MG tablet Commonly known as: PROAMATINE Take 1 tablet (10 mg total) by mouth 4 (four) times daily.   montelukast 10 MG tablet Commonly known as: SINGULAIR TAKE 1 TABLET BY MOUTH EVERY DAY   ondansetron 8 MG tablet Commonly known as: Zofran Take 1 tablet (8 mg total) by mouth 2 (two) times daily as needed for refractory nausea / vomiting. Start on day 3 after carboplatin chemo.   oxyCODONE 15 MG immediate  release tablet Commonly known as: ROXICODONE Take 1 tablet (15 mg total) by mouth every 4 (four) hours as needed for pain.   pantoprazole 20 MG tablet Commonly known as: PROTONIX TAKE ONE TABLET BY MOUTH TWICE A DAY   polyethylene glycol 17 g packet Commonly known as: MIRALAX / GLYCOLAX Take 17 g by mouth daily as needed for moderate constipation.   potassium chloride 10 MEQ tablet Commonly known as: KLOR-CON Take 1 tablet (10 mEq total) by mouth 2 (two) times daily.   prochlorperazine 10 MG tablet Commonly known as: COMPAZINE Take 1 tablet (10 mg total) by mouth every 6 (six) hours as needed (Nausea or vomiting).   rosuvastatin 10 MG tablet Commonly known as: Crestor Take 1 tablet (10 mg total) by mouth daily.            Durable Medical Equipment  (From admission, onward)         Start     Ordered   07/23/20 1602  For home use only DME standard manual wheelchair with seat cushion  Once       Comments: Patient suffers from small cell lung cancer which impairs their ability to perform daily activities like bathing, dressing, feeding, grooming, and toileting in the home.  A walker will not resolve issue with performing activities of daily living. A wheelchair will allow patient to safely perform daily activities. Patient can safely propel the wheelchair in the home or has a caregiver who can provide assistance. Length of need Lifetime. Accessories: elevating leg rests (ELRs), wheel locks, extensions and anti-tippers.   07/23/20 1602   07/23/20 1601  For home use only DME Walker rolling  Once       Question Answer Comment  Walker: With [place]   Patient needs a walker to treat with the following condition Small cell lung cancer ([place])      07/23/20 1602   07/23/20 1601  For home use only DME 3 n 1  Once        07/23/20 1602          Follow-up Information    Dew, Erskine Squibb, MD Follow up in 1 week(s).   Specialties: Vascular Surgery, Radiology, Interventional  Cardiology Why: Can see Dew or Arna Medici.  Will need bilateral lower extremity DVT with visit.  Seen as consult. Contact information: [place] [address] [phone]        Jerrol Banana., MD. Schedule an appointment as soon as possible for a visit in 1 week(s).   Specialty: Family Medicine Contact information: [address] [place]  [place] [address] [phone]        Lequita Asal, MD. Go on 07/27/2020.   Specialty: Hematology and Oncology Contact information: [place] [address] [phone]        [name], MD. Go on 07/28/2020.   Specialties: Cardiothoracic Surgery, General Surgery Contact information: [address] [place] [address] [place] [address] [phone]              Allergies  Allergen Reactions  . Indomethacin Swelling    ankle swelling  . Tamsulosin Hcl Hives  . Sulfa Antibiotics Rash    Consultations:  Vascular surgery  Oncology  Palliative care medicine   Procedures/Studies: DG Chest 1 View  Result Date: 07/24/2020 CLINICAL DATA:  Left pleural effusion. EXAM: CHEST  1 VIEW COMPARISON:  07/21/2020. FINDINGS: PowerPort catheter stable position. PleurX catheter in stable position. Persistent left base infiltrate and left-sided pleural effusion. No interim change. Mild left upper lobe infiltrate also again noted. No pneumothorax. Stable cardiomegaly. IMPRESSION: Left PleurX catheter in stable position. Persistent left base infiltrate and left-sided pleural effusion. Mild left upper lobe infiltrate also again noted. No interim change. Electronically Signed   By: Marcello Moores  Register   On: 07/24/2020 14:14   DG Chest 2 View  Result Date: 07/21/2020 CLINICAL DATA:  Pleural effusion, PleurX catheter in place. EXAM: CHEST - 2 VIEW COMPARISON:  July 13, 2020. FINDINGS: Similar appearance of a small partially loculated left pleural effusion with overlying airspace opacities.  Similar more subtle opacities in the left upper lobe. Similar position of a left PleurX catheter. The right lung is clear. No visible pneumothorax. Similar cardiomediastinal silhouette, partially obscured. Left subclavian approach Port-A-Cath in similar position. No acute osseous abnormality. IMPRESSION: 1. Similar appearance of a small partially loculated left pleural effusion with overlying airspace opacities. Similar position of a left PleurX catheter. 2. Similar more subtle opacities in the left upper lobe. Electronically Signed   By: Margaretha Sheffield MD   On: 07/21/2020 14:26   DG Chest 2 View  Result Date: 07/21/2020 CLINICAL DATA:  Shortness of breath. EXAM: CHEST - 2 VIEW COMPARISON:  July 20, 2020. FINDINGS: Similar appearance of a small partially loculated left pleural effusion with overlying airspace opacities. Similar more subtle opacities in the left upper lobe. Similar position of a left PleurX catheter. The right lung is clear. No visible pneumothorax. Similar cardiomediastinal silhouette, partially obscured. Left subclavian approach Port-A-Cath in similar position. No acute osseous abnormality. IMPRESSION: 1. Similar appearance of a small partially loculated left pleural effusion with overlying airspace opacities. Similar position of a left PleurX catheter. 2. Similar more subtle opacities in the left upper lobe. Electronically Signed   By: Margaretha Sheffield MD   On: 07/21/2020 14:25   DG Chest 2 View  Result Date: 07/13/2020 CLINICAL DATA:  Pleural effusion EXAM: CHEST - 2 VIEW COMPARISON:  June 29, 2020 FINDINGS: PleurX catheter again noted on the left with tip directed posteriorly. There is partially loculated left pleural effusion. There is airspace consolidation throughout mid and lower lung regions on the left. The right lung is clear. Heart is slightly enlarged with pulmonary vascularity normal. No adenopathy. No pneumothorax. There is bullous disease in the extreme right apex.  No bone lesions. Port-A-Cath tip in superior vena cava. IMPRESSION: PleurX catheter present. Left pleural effusion appears similar on the left, partially loculated. Airspace opacity throughout the mid and lower lung regions. Slightly less opacity in the left upper lobe compared to most recent study. Right  lung essentially clear. Stable cardiac silhouette. Electronically Signed   By: Lowella Grip III M.D.   On: 07/13/2020 10:10   CT ANGIO CHEST PE W OR WO CONTRAST  Result Date: 07/22/2020 CLINICAL DATA:  Shortness of breath, lung cancer EXAM: CT ANGIOGRAPHY CHEST WITH CONTRAST TECHNIQUE: Multidetector CT imaging of the chest was performed using the standard protocol during bolus administration of intravenous contrast. Multiplanar CT image reconstructions and MIPs were obtained to evaluate the vascular anatomy. CONTRAST:  49mL OMNIPAQUE IOHEXOL 350 MG/ML SOLN COMPARISON:  06/05/2020 FINDINGS: Cardiovascular: Filling defects seen within multiple right pulmonary arteries in all lobes of the right lung compatible with pulmonary emboli. No definite pulmonary emboli on the left. RV: LV ratio is mildly elevated at 1.0. Heart is mildly enlarged. 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 Coronary artery and aortic calcifications. Mediastinum/Nodes: Enlarging prevascular lymph nodes since prior study, measuring up to 10 mm currently, not measurable on prior study. Subcarinal adenopathy has increased, measuring 2 cm in short axis diameter compared to 1.5 cm previously. Abnormal left hilar lymph nodes, increasing since prior study. Lungs/Pleura: Small loculated pleural effusion again noted, similar prior study with PleurX catheter in place. Pleural soft tissue masses are noted throughout the left pleural space. Moderate emphysema. No confluent opacity or effusion on the right. Masslike area of airspace disease/consolidation sound in the left lower lobe, encases the left lower lobe pulmonary arteries and  measures 6.6 x 5.6 cm. Upper Abdomen: Bulky mass anteriorly in the left cardiophrenic region and extending between to left ribs has enlarged, measuring 4.9 x 3.9 cm compared to 4.4 x 3.2 cm previously. Small hypodensities throughout the liver compatible with cysts. Gastrohepatic ligament adenopathy worsened with short axis diameter measuring up 2 1.5 cm compared with 1.2 cm previously. Musculoskeletal: Left Port-A-Cath in place. No acute bony abnormality. Review of the MIP images confirms the above findings. IMPRESSION: Pulmonary emboli seen within all lobes of the right lung. Slightly elevated RV: LV ratio compatible with right heart strain. Worsening masslike consolidation in the left lower lobe. Pleural nodularity noted throughout the left pleural space with small loculated left pleural effusion. Worsening mediastinal and upper abdominal adenopathy. Worsening left anterior cardiophrenic/abdominal wall mass which extends between 2 of the left ribs. Coronary artery disease. Aortic Atherosclerosis (ICD10-I70.0) and Emphysema (ICD10-J43.9). These results were called by telephone at the time of interpretation on 07/22/2020 at 1:05 am to provider Rufina Falco , who verbally acknowledged these results. Electronically Signed   By: Rolm Baptise M.D.   On: 07/22/2020 01:06   MR BRAIN W WO CONTRAST  Result Date: 06/29/2020 CLINICAL DATA:  Initial evaluation for small cell lung cancer, staging. EXAM: MRI HEAD WITHOUT AND WITH CONTRAST TECHNIQUE: Multiplanar, multiecho pulse sequences of the brain and surrounding structures were obtained without and with intravenous contrast. CONTRAST:  7.51mL GADAVIST GADOBUTROL 1 MMOL/ML IV SOLN COMPARISON:  Prior MRI from 03/04/2013. FINDINGS: Brain: Examination degraded by motion artifact. Age-related cerebral atrophy with mild-to-moderate chronic microvascular ischemic disease. No acute intracranial infarct. Gray-white matter differentiation maintained. No encephalomalacia to  suggest chronic cortical infarction. No foci of susceptibility artifact to suggest acute or chronic intracranial hemorrhage. No mass lesion, midline shift or mass effect. No hydrocephalus or extra-axial fluid collection. Pituitary gland suprasellar region normal. Midline structures intact. No abnormal enhancement or findings to suggest intracranial metastatic disease. Vascular: Major intracranial vascular flow voids are maintained. Skull and upper cervical spine: Craniocervical junction within normal limits. No focal marrow replacing lesion. Multilevel degenerative spondylolysis  noted within the upper cervical spine. No scalp soft tissue abnormality. Sinuses/Orbits: Globes and orbital soft tissues demonstrate no acute finding. Paranasal sinuses are largely clear. No mastoid effusion. Inner ear structures grossly normal. Other: None. IMPRESSION: 1. No evidence for intracranial metastatic disease. 2. No other acute intracranial abnormality. 3. Mild to moderate chronic microvascular ischemic disease. Electronically Signed   By: Jeannine Boga M.D.   On: 06/29/2020 03:01   MR THORACIC SPINE W WO CONTRAST  Result Date: 06/29/2020 CLINICAL DATA:  Initial evaluation for non-small cell lung cancer, pretreatment staging. Abnormality T11-12. EXAM: MRI THORACIC WITHOUT AND WITH CONTRAST TECHNIQUE: Multiplanar and multiecho pulse sequences of the thoracic spine were obtained without and with intravenous contrast. CONTRAST:  7.67mL GADAVIST GADOBUTROL 1 MMOL/ML IV SOLN COMPARISON:  Prior PET-CT from 06/12/2020. FINDINGS: MRI THORACIC SPINE FINDINGS Alignment: Vertebral bodies normally aligned with preservation of the normal thoracic kyphosis. No listhesis. Vertebrae: Signal intensity within the visualized bone marrow is diffusely heterogeneous. A left paraspinous soft tissue mass is seen along the left anterolateral aspect of the vertebral column extending from approximately T9-10 through T12-L1 (series 20, image 18).  Evidence for invasion/involvement of the adjacent T10 and T11 vertebral bodies (series 21, image 14). Tumor partially extends into the left neural foramen at T11-12 (series 40, image 34). No other significant extension into the spinal canal or epidural tumor. A separate 13 mm metastasis seen involving the central/right aspect of the T10 vertebral body (series 39, image 8). Probable additional 13 mm metastatic lesion seen at T1 (series 20, image 9). 1 cm lesion seen within the right pedicle of T6 (series 21, image 6). No associated pathologic fracture. Cord:  Normal signal and morphology.  No significant epidural tumor. Paraspinal and other soft tissues: Paraspinous tumor as above. Paraspinous soft tissues demonstrate no other acute finding. Irregular and partially loculated left pleural effusion with scattered fluid-fluid levels partially visualized at the left lung base. Consolidative changes noted throughout the posterior left lung superiorly. Disc levels: Normal expected multilevel degenerative disc desiccation seen within the thoracic spine. Mild disc bulging noted at the T7-8 level without significant spinal stenosis. Multilevel facet hypertrophy throughout the lower thoracic spine. No significant stenosis or foraminal encroachment. IMPRESSION: 1. 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 Tumor partially extends into the left neural foramen at T11-12. No other significant extension into the spinal canal or epidural tumor. 2. Additional metastatic lesions involving the T1 and T10 vertebral bodies as well as the right pedicle of T6. No associated pathologic fracture. 3. Heterogeneous and partially loculated left pleural effusion with consolidation signal intensity throughout the visualized left lower lung. Electronically Signed   By: Jeannine Boga M.D.   On: 06/29/2020 03:18   PERIPHERAL VASCULAR  CATHETERIZATION  Result Date: 07/22/2020 See op note  US Venous Img Lower Bilateral (DVT)  Result Date: 07/21/2020 CLINICAL DATA:  Lower extremity edema EXAM: BILATERAL LOWER EXTREMITY VENOUS DOPPLER ULTRASOUND TECHNIQUE: Gray-scale sonography with graded compression, as well as color Doppler and duplex ultrasound were performed to evaluate the lower extremity deep venous systems from the level of the common femoral vein and including the common femoral, femoral, profunda femoral, popliteal and calf veins including the posterior tibial, peroneal and gastrocnemius veins when visible. The superficial great saphenous vein was also interrogated. Spectral Doppler was utilized to evaluate flow at rest and with distal augmentation maneuvers in the common femoral, femoral and popliteal veins. COMPARISON:  None. FINDINGS: RIGHT LOWER EXTREMITY Common Femoral Vein: No evidence of thrombus. Normal compressibility, respiratory phasicity and response to augmentation. Saphenofemoral Junction: No evidence of thrombus. Normal compressibility and flow on color Doppler imaging. Profunda Femoral Vein: No evidence of thrombus. Normal compressibility and flow on color Doppler imaging. Femoral Vein: No evidence of thrombus. Normal compressibility, respiratory phasicity and response to augmentation. Popliteal Vein: Thrombus is noted with decreased compressibility. Calf Veins: Thrombus is noted within the posterior tibial and peroneal veins with decreased compressibility. Superficial Great Saphenous Vein: No evidence of thrombus. Normal compressibility. Venous Reflux:  None. Other Findings:  None. LEFT LOWER EXTREMITY Common Femoral Vein: No evidence of thrombus. Normal compressibility, respiratory phasicity and response to augmentation. Saphenofemoral Junction: No evidence of thrombus. Normal compressibility and flow on color Doppler imaging. Profunda Femoral Vein: No evidence of thrombus. Normal compressibility and flow on color  Doppler imaging. Femoral Vein: No evidence of thrombus. Normal compressibility, respiratory phasicity and response to augmentation. Popliteal Vein: Thrombus is noted with decreased compressibility. Calf Veins: Peroneal vein demonstrates thrombus with decreased compressibility. Superficial Great Saphenous Vein: No evidence of thrombus. Normal compressibility. Venous Reflux:  None. Other Findings:  None. IMPRESSION: Bilateral deep venous thrombosis involving the popliteal and calf veins. Electronically Signed   By: Inez Catalina M.D.   On: 07/21/2020 18:06   DG Chest Port 1 View  Result Date: 06/29/2020 CLINICAL DATA:  Status post Port-A-Cath placement. EXAM: PORTABLE CHEST 1 VIEW COMPARISON:  June 25, 2020. FINDINGS: Stable cardiomegaly. Interval placement of left subclavian Port-A-Cath with distal tip in expected position of cavoatrial junction. No pneumothorax pleural effusion is noted. Right lung is clear. Left pleural effusion is significantly smaller status post pleural drainage catheter placement. Diffuse left lung opacity is noted concerning for atelectasis or possibly inflammation. Bony thorax is unremarkable. IMPRESSION: Interval placement of left subclavian Port-A-Cath with distal tip in expected position of cavoatrial junction. Left pleural effusion is significantly smaller status post pleural drainage catheter placement. Electronically Signed   By: Marijo Conception M.D.   On: 06/29/2020 13:30   DG C-Arm 1-60 Min-No Report  Result Date: 06/29/2020 Fluoroscopy was utilized by the requesting physician.  No radiographic interpretation.   ECHOCARDIOGRAM COMPLETE  Result Date: 07/22/2020    ECHOCARDIOGRAM REPORT   Patient Name:   ANGELUS HOOPES Date of Exam: 07/22/2020 Medical Rec #:  [id number]    Height:       71.0 in Accession #:    [id number]   Weight:       176.4 lb Date of Birth:  1946/06/04     BSA:          1.999 m Patient Age:    67 years     BP:           106/67 mmHg Patient Gender: M             HR:           104 bpm. Exam Location:  [facility] Procedure: 2D Echo, Cardiac Doppler and Color Doppler Indications:     Shortness of breath, DVT  History:         Patient has no prior history of Echocardiogram examinations.                  COPD; Risk Factors:Hypertension and Dyslipidemia.  Sonographer:     Sherrie Sport RDCS (AE) Referring Phys:  [place] Diagnosing Phys: Kate Sable MD IMPRESSIONS  1. Left ventricular ejection fraction, by estimation,  is 65 to 70%. The left ventricle has normal function. The left ventricle has no regional wall motion abnormalities. Left ventricular diastolic parameters are consistent with Grade I diastolic dysfunction (impaired relaxation).  2. Right ventricular systolic function is normal. The right ventricular size is normal. There is moderately elevated pulmonary artery systolic pressure.  3. The mitral valve is normal in structure. No evidence of mitral valve regurgitation.  4. The aortic valve is normal in structure. Aortic valve regurgitation is not visualized.  5. The inferior vena cava is normal in size with greater than 50% respiratory variability, suggesting right atrial pressure of 3 mmHg. FINDINGS  Left Ventricle: Left ventricular ejection fraction, by estimation, is 65 to 70%. The left ventricle has normal function. The left ventricle has no regional wall motion abnormalities. The left ventricular internal cavity size was normal in size. There is  borderline left ventricular hypertrophy. Left ventricular diastolic parameters are consistent with Grade I diastolic dysfunction (impaired relaxation). Right Ventricle: The right ventricular size is normal. No increase in right ventricular wall thickness. Right ventricular systolic function is normal. There is moderately elevated pulmonary artery systolic pressure. The tricuspid regurgitant velocity is 3.49 m/s, and with an assumed right atrial pressure of 3 mmHg, the estimated right ventricular systolic pressure  is 16.1 mmHg. Left Atrium: Left atrial size was normal in size. Right Atrium: Right atrial size was normal in size. Pericardium: There is no evidence of pericardial effusion. Mitral Valve: The mitral valve is normal in structure. No evidence of mitral valve regurgitation. Tricuspid Valve: The tricuspid valve is normal in structure. Tricuspid valve regurgitation is not demonstrated. Aortic Valve: The aortic valve is normal in structure. Aortic valve regurgitation is not visualized. Aortic valve mean gradient measures 4.0 mmHg. Aortic valve peak gradient measures 7.8 mmHg. Aortic valve area, by VTI measures 3.46 cm. Pulmonic Valve: The pulmonic valve was not well visualized. Pulmonic valve regurgitation is not visualized. Aorta: The aortic root is normal in size and structure. Venous: The inferior vena cava is normal in size with greater than 50% respiratory variability, suggesting right atrial pressure of 3 mmHg. IAS/Shunts: No atrial level shunt detected by color flow Doppler.  LEFT VENTRICLE PLAX 2D LVIDd:         3.73 cm  Diastology LVIDs:         2.66 cm  LV e' medial:    3.59 cm/s LV PW:         1.08 cm  LV E/e' medial:  25.4 LV IVS:        1.08 cm  LV e' lateral:   7.83 cm/s LVOT diam:     2.10 cm  LV E/e' lateral: 11.6 LV SV:         77 LV SV Index:   38 LVOT Area:     3.46 cm  RIGHT VENTRICLE RV Basal diam:  3.19 cm RV S prime:     14.90 cm/s TAPSE (M-mode): 3.6 cm LEFT ATRIUM             Index       RIGHT ATRIUM           Index LA diam:        3.20 cm 1.60 cm/m  RA Area:     18.40 cm LA Vol (A2C):   60.9 ml 30.47 ml/m RA Volume:   51.00 ml  25.51 ml/m LA Vol (A4C):   41.9 ml 20.96 ml/m LA Biplane Vol: 53.3 ml 26.67 ml/m  AORTIC VALVE  PULMONIC VALVE AV Area (Vmax):    2.63 cm    PV Vmax:        0.34 m/s AV Area (Vmean):   2.74 cm    PV Peak grad:   0.4 mmHg AV Area (VTI):     3.46 cm    RVOT Peak grad: 2 mmHg AV Vmax:           139.50 cm/s AV Vmean:          93.750 cm/s AV VTI:             0.222 m AV Peak Grad:      7.8 mmHg AV Mean Grad:      4.0 mmHg LVOT Vmax:         106.00 cm/s LVOT Vmean:        74.200 cm/s LVOT VTI:          0.222 m LVOT/AV VTI ratio: 1.00  AORTA Ao Root diam: 3.30 cm MITRAL VALVE                TRICUSPID VALVE MV Area (PHT): 3.60 cm     TR Peak grad:   48.7 mmHg MV Decel Time: 211 msec     TR Vmax:        349.00 cm/s MV E velocity: 91.20 cm/s MV A velocity: 139.00 cm/s  SHUNTS MV E/A ratio:  0.66         Systemic VTI:  0.22 m                             Systemic Diam: 2.10 cm Kate Sable MD Electronically signed by Kate Sable MD Signature Date/Time: 07/22/2020/12:42:21 PM    Final        Discharge Exam: Vitals:   07/25/20 0405 07/25/20 0733  BP: (!) 91/57 92/61  Pulse: 76 84  Resp:  18  Temp:  99 F (37.2 C)  SpO2:  99%    General: Pt is alert, awake, not in acute distress Cardiovascular: RRR, S1/S2 +, no edema Respiratory: Diminished breath sound left base, no wheezing, no rhonchi, no respiratory distress, no conversational dyspnea  Abdominal: Soft, NT, ND, bowel sounds + Extremities: no edema, no cyanosis Psych: Normal mood and affect, stable judgement and insight     The results of significant diagnostics from this hospitalization (including imaging, microbiology, ancillary and laboratory) are listed below for reference.     Microbiology: Recent Results (from the past 240 hour(s))  Respiratory Panel by RT PCR (Flu A&B, Covid) - Nasopharyngeal Swab     Status: None   Collection Time: 07/21/20  4:12 PM   Specimen: Nasopharyngeal Swab  Result Value Ref Range Status   SARS Coronavirus 2 by RT PCR NEGATIVE NEGATIVE Final    Comment: (NOTE) SARS-CoV-2 target nucleic acids are NOT DETECTED.  The SARS-CoV-2 RNA is generally detectable in upper respiratoy specimens during the acute phase of infection. The lowest concentration of SARS-CoV-2 viral copies this assay can detect is 131 copies/mL. A negative result does not  preclude SARS-Cov-2 infection and should not be used as the sole basis for treatment or other patient management decisions. A negative result may occur with  improper specimen collection/handling, submission of specimen other than nasopharyngeal swab, presence of viral mutation(s) within the areas targeted by this assay, and inadequate number of viral copies (<131 copies/mL). A negative result must be combined with clinical observations, patient history, and epidemiological information. The  expected result is Negative.  Fact Sheet for Patients:  PinkCheek.be  Fact Sheet for Healthcare Providers:  GravelBags.it  This test is no t yet approved or cleared by the [place] FDA and  has been authorized for detection and/or diagnosis of SARS-CoV-2 by FDA under an Emergency Use Authorization (EUA). This EUA will remain  in effect (meaning this test can be used) for the duration of the COVID-19 declaration under Section 564(b)(1) of the Act, 21 U.S.C. section 360bbb-3(b)(1), unless the authorization is terminated or revoked sooner.     Influenza A by PCR NEGATIVE NEGATIVE Final   Influenza B by PCR NEGATIVE NEGATIVE Final    Comment: (NOTE) The Xpert Xpress SARS-CoV-2/FLU/RSV assay is intended as an aid in  the diagnosis of influenza from Nasopharyngeal swab specimens and  should not be used as a sole basis for treatment. Nasal washings and  aspirates are unacceptable for Xpert Xpress SARS-CoV-2/FLU/RSV  testing.  Fact Sheet for Patients: PinkCheek.be  Fact Sheet for Healthcare Providers: GravelBags.it  This test is not yet approved or cleared by the [place] FDA and  has been authorized for detection and/or diagnosis of SARS-CoV-2 by  FDA under an Emergency Use Authorization (EUA). This EUA will remain  in effect (meaning this test can be used) for the  duration of the  Covid-19 declaration under Section 564(b)(1) of the Act, 21  U.S.C. section 360bbb-3(b)(1), unless the authorization is  terminated or revoked. Performed at [facility], [address] [place], [place] [address]   MRSA PCR Screening     Status: None   Collection Time: 07/23/20 11:26 AM   Specimen: Nasal Mucosa; Nasopharyngeal  Result Value Ref Range Status   MRSA by PCR NEGATIVE NEGATIVE Final    Comment:        The GeneXpert MRSA Assay (FDA approved for NASAL specimens only), is one component of a comprehensive MRSA colonization surveillance program. It is not intended to diagnose MRSA infection nor to guide or monitor treatment for MRSA infections. Performed at [facility], [address] [place], [place] [address]      Labs: BNP (last 3 results) Recent Labs    07/21/20 1818  BNP 094.7*   Basic Metabolic Panel: Recent Labs  Lab 07/21/20 1333 07/23/20 0500 07/24/20 0530 07/25/20 0342  NA 135 136 133* 137  K 3.8 4.0 3.9 4.1  CL 101 105 103 107  CO2 22 23 20* 22  GLUCOSE 125* 106* 100* 96  BUN 17 13 13 13   CREATININE 0.90 0.75 0.85 0.86  CALCIUM 8.9 8.2* 8.6* 8.4*   Liver Function Tests: No results for input(s): AST, ALT, ALKPHOS, BILITOT, PROT, ALBUMIN in the last 168 hours. No results for input(s): LIPASE, AMYLASE in the last 168 hours. No results for input(s): AMMONIA in the last 168 hours. CBC: Recent Labs  Lab 07/21/20 1333 07/22/20 0400 07/23/20 0500 07/24/20 0530 07/25/20 0342  WBC 18.0* 19.6* 23.2* 23.3* 23.3*  NEUTROABS  --  15.5*  --   --   --   HGB 10.2* 9.1* 7.8* 7.9* 7.3*  HCT 30.6* 27.5* 23.5* 23.6* 22.3*  MCV 88.2 88.7 88.7 86.8 87.8  PLT 133* 129* 125* 147* 159   Cardiac Enzymes: No results for input(s): CKTOTAL, CKMB, CKMBINDEX, TROPONINI in the last 168 hours. BNP: Invalid input(s): POCBNP CBG: No results for input(s): GLUCAP in the last 168 hours. D-Dimer No results for input(s):  DDIMER in the last 72 hours. Hgb A1c No results for input(s):  HGBA1C in the last 72 hours. Lipid Profile No results for input(s): CHOL, HDL, LDLCALC, TRIG, CHOLHDL, LDLDIRECT in the last 72 hours. Thyroid function studies No results for input(s): TSH, T4TOTAL, T3FREE, THYROIDAB in the last 72 hours.  Invalid input(s): FREET3 Anemia work up No results for input(s): VITAMINB12, FOLATE, FERRITIN, TIBC, IRON, RETICCTPCT in the last 72 hours. Urinalysis    Component Value Date/Time   APPEARANCEUR Clear 07/31/2019 1109   GLUCOSEU Negative 07/31/2019 1109   BILIRUBINUR negative 04/23/2020 1511   BILIRUBINUR Negative 07/31/2019 1109   PROTEINUR Negative 04/23/2020 1511   PROTEINUR Negative 07/31/2019 1109   UROBILINOGEN 2.0 (A) 04/23/2020 1511   NITRITE negative 04/23/2020 1511   NITRITE Negative 07/31/2019 1109   LEUKOCYTESUR Negative 04/23/2020 1511   LEUKOCYTESUR Negative 07/31/2019 1109   Sepsis Labs Invalid input(s): PROCALCITONIN,  WBC,  LACTICIDVEN Microbiology Recent Results (from the past 240 hour(s))  Respiratory Panel by RT PCR (Flu A&B, Covid) - Nasopharyngeal Swab     Status: None   Collection Time: 07/21/20  4:12 PM   Specimen: Nasopharyngeal Swab  Result Value Ref Range Status   SARS Coronavirus 2 by RT PCR NEGATIVE NEGATIVE Final    Comment: (NOTE) SARS-CoV-2 target nucleic acids are NOT DETECTED.  The SARS-CoV-2 RNA is generally detectable in upper respiratoy specimens during the acute phase of infection. The lowest concentration of SARS-CoV-2 viral copies this assay can detect is 131 copies/mL. A negative result does not preclude SARS-Cov-2 infection and should not be used as the sole basis for treatment or other patient management decisions. A negative result may occur with  improper specimen collection/handling, submission of specimen other than nasopharyngeal swab, presence of viral mutation(s) within the areas targeted by this assay, and inadequate number  of viral copies (<131 copies/mL). A negative result must be combined with clinical observations, patient history, and epidemiological information. The expected result is Negative.  Fact Sheet for Patients:  PinkCheek.be  Fact Sheet for Healthcare Providers:  GravelBags.it  This test is no t yet approved or cleared by the [place] FDA and  has been authorized for detection and/or diagnosis of SARS-CoV-2 by FDA under an Emergency Use Authorization (EUA). This EUA will remain  in effect (meaning this test can be used) for the duration of the COVID-19 declaration under Section 564(b)(1) of the Act, 21 U.S.C. section 360bbb-3(b)(1), unless the authorization is terminated or revoked sooner.     Influenza A by PCR NEGATIVE NEGATIVE Final   Influenza B by PCR NEGATIVE NEGATIVE Final    Comment: (NOTE) The Xpert Xpress SARS-CoV-2/FLU/RSV assay is intended as an aid in  the diagnosis of influenza from Nasopharyngeal swab specimens and  should not be used as a sole basis for treatment. Nasal washings and  aspirates are unacceptable for Xpert Xpress SARS-CoV-2/FLU/RSV  testing.  Fact Sheet for Patients: PinkCheek.be  Fact Sheet for Healthcare Providers: GravelBags.it  This test is not yet approved or cleared by the [place] FDA and  has been authorized for detection and/or diagnosis of SARS-CoV-2 by  FDA under an Emergency Use Authorization (EUA). This EUA will remain  in effect (meaning this test can be used) for the duration of the  Covid-19 declaration under Section 564(b)(1) of the Act, 21  U.S.C. section 360bbb-3(b)(1), unless the authorization is  terminated or revoked. Performed at [facility], [address], [place], [place] [address]   MRSA PCR Screening     Status: None   Collection Time: 07/23/20 11:26 AM  Specimen: Nasal Mucosa;  Nasopharyngeal  Result Value Ref Range Status   MRSA by PCR NEGATIVE NEGATIVE Final    Comment:        The GeneXpert MRSA Assay (FDA approved for NASAL specimens only), is one component of a comprehensive MRSA colonization surveillance program. It is not intended to diagnose MRSA infection nor to guide or monitor treatment for MRSA infections. Performed at [facility], [address] [place], [place] [address]      Patient was seen and examined on the day of discharge and was found to be in stable condition. Time coordinating discharge: 35 minutes including assessment and coordination of care, as well as examination of the patient.   SIGNED:  Dessa Phi, DO [facility] 07/25/2020, 9:16 AM

## 2020-07-25 NOTE — Progress Notes (Signed)
[place] for Heparin  Indication: pulmonary embolus and DVT  Allergies  Allergen Reactions  . Indomethacin Swelling    ankle swelling  . Tamsulosin Hcl Hives  . Sulfa Antibiotics Rash    Patient Measurements: Height: 5\' 11"  (180.3 cm) Weight: 79 kg (174 lb 2.6 oz) IBW/kg (Calculated) : 75.3 Heparin Dosing Weight: 80 kg   Vital Signs: Temp: 98.4 F (36.9 C) (11/06 0400) Temp Source: Oral (11/06 0400) BP: 91/57 (11/06 0405) Pulse Rate: 76 (11/06 0405)  Labs: Recent Labs    07/23/20 0500 07/23/20 0500 07/23/20 0743 07/23/20 1620 07/24/20 0530 07/25/20 0342  HGB 7.8*   < >  --   --  7.9* 7.3*  HCT 23.5*  --   --   --  23.6* 22.3*  PLT 125*  --   --   --  147* 159  HEPARINUNFRC  --   --    < > 0.64 0.51 0.71*  CREATININE 0.75  --   --   --  0.85 0.86   < > = values in this interval not displayed.    Estimated Creatinine Clearance: 80.3 mL/min (by C-G formula based on SCr of 0.86 mg/dL).   Medical History: Past Medical History:  Diagnosis Date  . Anemia   . Back pain   . BPH (benign prostatic hyperplasia)   . Cirrhosis ([place])   . COPD (chronic obstructive pulmonary disease) ([place])   . ED (erectile dysfunction)   . GERD (gastroesophageal reflux disease)   . Hyperlipidemia   . Hypertension     Medications:  Medications Prior to Admission  Medication Sig Dispense Refill Last Dose  . acetaminophen (TYLENOL) 325 MG tablet Take 2 tablets (650 mg total) by mouth 3 (three) times daily. (Patient taking differently: Take 650 mg by mouth every 6 (six) hours as needed for moderate pain. ) 180 tablet 0 Past Month at prn  . cholecalciferol (VITAMIN D3) 25 MCG (1000 UNIT) tablet Take 1,000 Units by mouth daily.   Past Week at Unknown time  . dutasteride (AVODART) 0.5 MG capsule Take 0.5 mg by mouth daily.   Past Week at Unknown time  . feeding supplement (ENSURE ENLIVE / ENSURE PLUS) LIQD Take 237 mLs by mouth 2 (two) times daily  between meals. 14220 mL 0 Past Week at Unknown time  . folic acid (FOLVITE) 1 MG tablet Take 1 tablet (1 mg total) by mouth daily. 30 tablet 0 Past Week at Unknown time  . lactulose (CHRONULAC) 10 GM/15ML solution Take 45 mLs (30 g total) by mouth 2 (two) times daily. 1992 mL 0 Past Week at Unknown time  . lidocaine (LIDODERM) 5 % Place 1 patch onto the skin daily. Remove & Discard patch within 12 hours or as directed by MD 30 patch 0 Past Month at Unknown time  . Lidocaine (SALONPAS PAIN RELIEVING) 4 % PTCH Apply 1 patch topically every morning. 30 patch 0 Past Month at Unknown time  . loratadine (CLARITIN) 10 MG tablet Take 10 mg by mouth daily.   Past Week at Unknown time  . megestrol (MEGACE) 40 MG/ML suspension Take 5 mLs (200 mg total) by mouth daily. 150 mL 1 Past Week at Unknown time  . midodrine (PROAMATINE) 10 MG tablet Take 1 tablet (10 mg total) by mouth 4 (four) times daily. 120 tablet 0 07/21/2020 at Unknown time  . montelukast (SINGULAIR) 10 MG tablet TAKE 1 TABLET BY MOUTH EVERY DAY (Patient taking differently: Take 10  mg by mouth daily. ) 90 tablet 3 Past Week at Unknown time  . ondansetron (ZOFRAN) 8 MG tablet Take 1 tablet (8 mg total) by mouth 2 (two) times daily as needed for refractory nausea / vomiting. Start on day 3 after carboplatin chemo. 30 tablet 1 Past Month at Unknown time  . oxyCODONE (ROXICODONE) 15 MG immediate release tablet Take 1 tablet (15 mg total) by mouth every 4 (four) hours as needed for pain. 120 tablet 0 07/21/2020 at 0930  . pantoprazole (PROTONIX) 20 MG tablet TAKE ONE TABLET BY MOUTH TWICE A DAY 60 tablet 5 Past Week at Unknown time  . polyethylene glycol (MIRALAX / GLYCOLAX) 17 g packet Take 17 g by mouth daily as needed for moderate constipation. 30 each 0 Past Week at prn  . potassium chloride (K-DUR,KLOR-CON) 10 MEQ tablet Take 1 tablet (10 mEq total) by mouth 2 (two) times daily. 180 tablet 3 Past Week at Unknown time  . prochlorperazine (COMPAZINE) 10  MG tablet Take 1 tablet (10 mg total) by mouth every 6 (six) hours as needed (Nausea or vomiting). 30 tablet 1 Past Month at prn  . rosuvastatin (CRESTOR) 10 MG tablet Take 1 tablet (10 mg total) by mouth daily. 90 tablet 3 Past Month at Unknown time  . vitamin B-12 1000 MCG tablet Take 1 tablet (1,000 mcg total) by mouth daily. 180 tablet 1 Past Month at Unknown time  . diclofenac Sodium (VOLTAREN) 1 % GEL Apply 1 application topically 4 (four) times daily as needed (pain). (Patient not taking: Reported on 07/22/2020)   Not Taking at prn    Assessment: Pharmacy consulted to dose heparin in this 74 year old male admitted with DVT.   Pt was originally ordered therapeutic lovenox but has been switched to UFH in anticipation of vascular surgery. S/p thrombectomy 11/3  11/3 2119 HL 0.19  11/4 0743 HL 0.43  11/4 1620 HL 0.64 11/5 0530 HL 0.51 11/6 0342 HL 0.71   Goal of Therapy:  Heparin level 0.3-0.7 units/ml Monitor platelets by anticoagulation protocol: Yes   Plan: 11/5:  HL @ 0530 = 0.51 Will continue pt on current rate and recheck HL on 11/6 with AM labs.    11/6: HL @ 0342 = 0.71 Will decrease heparin drip to 1600 units/hr and recheck HL 8 hrs after rate change.   [place] Perris D, PharmD 07/25/2020 5:38 AM

## 2020-07-25 NOTE — Progress Notes (Signed)
Discharge instructions explained/pt and wife verbalized understanding. IV and tele removed, port deaccessed. Will transports of unit via wheelchair.

## 2020-07-25 NOTE — Progress Notes (Signed)
ANTICOAGULATION CONSULT NOTE   Pharmacy Consult for Eliquis  Indication: pulmonary embolus and DVT  Allergies  Allergen Reactions  . Indomethacin Swelling    ankle swelling  . Tamsulosin Hcl Hives  . Sulfa Antibiotics Rash    Patient Measurements: Height: 5\' 11"  (180.3 cm) Weight: 80 kg (176 lb 5.9 oz) IBW/kg (Calculated) : 75.3 Heparin Dosing Weight: 80 kg   Vital Signs: Temp: 98.4 F (36.9 C) (11/06 0400) Temp Source: Oral (11/06 0400) BP: 91/57 (11/06 0405) Pulse Rate: 76 (11/06 0405)  Labs: Recent Labs    07/23/20 0500 07/23/20 0500 07/23/20 0743 07/23/20 1620 07/24/20 0530 07/25/20 0342  HGB 7.8*   < >  --   --  7.9* 7.3*  HCT 23.5*  --   --   --  23.6* 22.3*  PLT 125*  --   --   --  147* 159  HEPARINUNFRC  --   --    < > 0.64 0.51 0.71*  CREATININE 0.75  --   --   --  0.85 0.86   < > = values in this interval not displayed.    Estimated Creatinine Clearance: 80.3 mL/min (by C-G formula based on SCr of 0.86 mg/dL).   Medical History: Past Medical History:  Diagnosis Date  . Anemia   . Back pain   . BPH (benign prostatic hyperplasia)   . Cirrhosis ([place])   . COPD (chronic obstructive pulmonary disease) ([place])   . ED (erectile dysfunction)   . GERD (gastroesophageal reflux disease)   . Hyperlipidemia   . Hypertension     Medications:  Medications Prior to Admission  Medication Sig Dispense Refill Last Dose  . acetaminophen (TYLENOL) 325 MG tablet Take 2 tablets (650 mg total) by mouth 3 (three) times daily. (Patient taking differently: Take 650 mg by mouth every 6 (six) hours as needed for moderate pain. ) 180 tablet 0 Past Month at prn  . cholecalciferol (VITAMIN D3) 25 MCG (1000 UNIT) tablet Take 1,000 Units by mouth daily.   Past Week at Unknown time  . dutasteride (AVODART) 0.5 MG capsule Take 0.5 mg by mouth daily.   Past Week at Unknown time  . feeding supplement (ENSURE ENLIVE / ENSURE PLUS) LIQD Take 237 mLs by mouth 2 (two) times daily  between meals. 14220 mL 0 Past Week at Unknown time  . folic acid (FOLVITE) 1 MG tablet Take 1 tablet (1 mg total) by mouth daily. 30 tablet 0 Past Week at Unknown time  . lactulose (CHRONULAC) 10 GM/15ML solution Take 45 mLs (30 g total) by mouth 2 (two) times daily. 1992 mL 0 Past Week at Unknown time  . lidocaine (LIDODERM) 5 % Place 1 patch onto the skin daily. Remove & Discard patch within 12 hours or as directed by MD 30 patch 0 Past Month at Unknown time  . Lidocaine (SALONPAS PAIN RELIEVING) 4 % PTCH Apply 1 patch topically every morning. 30 patch 0 Past Month at Unknown time  . loratadine (CLARITIN) 10 MG tablet Take 10 mg by mouth daily.   Past Week at Unknown time  . megestrol (MEGACE) 40 MG/ML suspension Take 5 mLs (200 mg total) by mouth daily. 150 mL 1 Past Week at Unknown time  . midodrine (PROAMATINE) 10 MG tablet Take 1 tablet (10 mg total) by mouth 4 (four) times daily. 120 tablet 0 07/21/2020 at Unknown time  . montelukast (SINGULAIR) 10 MG tablet TAKE 1 TABLET BY MOUTH EVERY DAY (Patient taking differently: Take 10  mg by mouth daily. ) 90 tablet 3 Past Week at Unknown time  . ondansetron (ZOFRAN) 8 MG tablet Take 1 tablet (8 mg total) by mouth 2 (two) times daily as needed for refractory nausea / vomiting. Start on day 3 after carboplatin chemo. 30 tablet 1 Past Month at Unknown time  . oxyCODONE (ROXICODONE) 15 MG immediate release tablet Take 1 tablet (15 mg total) by mouth every 4 (four) hours as needed for pain. 120 tablet 0 07/21/2020 at 0930  . pantoprazole (PROTONIX) 20 MG tablet TAKE ONE TABLET BY MOUTH TWICE A DAY 60 tablet 5 Past Week at Unknown time  . polyethylene glycol (MIRALAX / GLYCOLAX) 17 g packet Take 17 g by mouth daily as needed for moderate constipation. 30 each 0 Past Week at prn  . potassium chloride (K-DUR,KLOR-CON) 10 MEQ tablet Take 1 tablet (10 mEq total) by mouth 2 (two) times daily. 180 tablet 3 Past Week at Unknown time  . prochlorperazine (COMPAZINE) 10  MG tablet Take 1 tablet (10 mg total) by mouth every 6 (six) hours as needed (Nausea or vomiting). 30 tablet 1 Past Month at prn  . rosuvastatin (CRESTOR) 10 MG tablet Take 1 tablet (10 mg total) by mouth daily. 90 tablet 3 Past Month at Unknown time  . vitamin B-12 1000 MCG tablet Take 1 tablet (1,000 mcg total) by mouth daily. 180 tablet 1 Past Month at Unknown time  . diclofenac Sodium (VOLTAREN) 1 % GEL Apply 1 application topically 4 (four) times daily as needed (pain). (Patient not taking: Reported on 07/22/2020)   Not Taking at prn    Assessment: Pharmacy consulted to dose heparin in this 74 year old male admitted with DVT.   Pt was originally ordered therapeutic lovenox but has been switched to UFH in anticipation of vascular surgery. S/p thrombectomy 11/3  11/3 2119 HL 0.19  11/4 0743 HL 0.43  11/4 1620 HL 0.64 11/5 0530 HL 0.51 11/6 0342 HL 0.71   Goal of Therapy:  Heparin level 0.3-0.7 units/ml Monitor platelets by anticoagulation protocol: Yes   Plan: Consult to transition to Eliquis. Will start Eliquis 10 mg BID x 7 days followed by 5 mg BID. Heparin to stop with first dose of Eliquis.  Tawnya Crook, PharmD 07/25/2020 7:30 AM

## 2020-07-25 NOTE — TOC Transition Note (Addendum)
Transition of Care [facility]) - CM/SW Discharge Note   Patient Details  Name: Dwayne Thompson MRN: [id number] Date of Birth: 20-Aug-1946  [facility] CM/SW Contact:  Izola Price, RN Phone Number: 07/25/2020, 9:45 AM   Clinical Narrative:     S/p mechanical thrombectomy for R sided PE and Oncology consult this admission.   Spoke with Tillie Rung at [facility] to confirm discharge order written for today.    Spoke with patient over the phone and he confirmed DME delivered and pt. Has a rolling walker at    home.   Pt stated he was feeling better.    Wife is present and will transport home.   Discussed that [facility] may not contact them till Sunday or Monday but confirmed they had contact    numbers to get in touch with them if they needed to or had questions.    Discharge summary present.    Contacted RN to see if anything else needed to discharge or if CM could help with anything. RN   indicated everything was in order.    Patient stated he had no further needs or questions at the time of the CM contact.    Pt on O2 2L per [place] at home PTA. Pt states he has oxygen for transport home and is already set   up for oxygen at home. Will be discharged on 3L per [place] per discharge summary and patient.    AVS reflects follow up appointments made with PCP in 1 week. Oncology 11/18, cardiothoraci surgery 11/9. Palliative care will continue to follow.  Outpatient lab monitoring ordered per discharge summary.   Patient has transport home.  Barriers resolved, pending discharge.       Barriers to Discharge: Barriers Resolved   Patient Goals and CMS Choice Patient states their goals for this hospitalization and ongoing recovery are:: to get better   Choice offered to / list presented to : Patient  Discharge Placement                       Discharge Plan and Services     Post Acute Care Choice: Home Health                               Social Determinants of Health (SDOH)  Interventions     Readmission Risk Interventions Readmission Risk Prevention Plan 07/23/2020  Transportation Screening Complete  Medication Review Press photographer) Complete  PCP or Specialist appointment within 3-5 days of discharge Complete  HRI or [place] Complete  SW Recovery Care/Counseling Consult Complete  [place] Not Applicable  Some recent data might be hidden

## 2020-07-25 NOTE — Progress Notes (Signed)
PleurX catheter drained, 132ml, sanguineous fluid. Pt tolerated well. Site CDI. Will continue to monitor

## 2020-07-25 NOTE — TOC Progression Note (Signed)
Transition of Care [facility]) - Progression Note    Patient Details  Name: Dwayne Thompson MRN: [id number] Date of Birth: [birth year]  [facility] CM/SW Contact  Izola Price, RN Phone Number: 07/25/2020, 10:27 AM  Clinical Narrative:   Pt has discharge orders. Spoke with patient over the phone and denied any needs. Discharge summary present. Notifed [facility]. Will check if DME delivered and if CM can assist in any other manner for discharge. Simmie Davies RN CM     Expected Discharge Plan: [place] Barriers to Discharge: Barriers Resolved  Expected Discharge Plan and Services Expected Discharge Plan: [place] Choice: [place] arrangements for the past 2 months: Single Family Home Expected Discharge Date: 07/25/20               DME Arranged: Wheelchair manual [facility] with seat cushion per OT. Notes state PT has own Conservation officer, nature.) DME Agency: [employer] Date DME Agency Contacted: 07/23/20 (According to notes by SW on 07/24/20) Time DME Agency Contacted: [phone] (Per SW notes 07/24/20) Representative spoke with at DME Agency: Mardene Celeste [place]: PT, OT [facility] Agency: [facility] Date [place]: 07/25/20 (reconfirmed with Tillie Rung) Time [place]: [phone] Representative spoke with at [place]: [place] ([place]) Interventions    Readmission Risk Interventions Readmission Risk Prevention Plan 07/23/2020  Transportation Screening Complete  Medication Review Press photographer) Complete  PCP or Specialist appointment within 3-5 days of discharge Complete  HRI or [place] Complete  SW Recovery Care/Counseling Consult Complete  [place] Not Applicable  Some recent data might be hidden

## 2020-07-26 NOTE — Progress Notes (Signed)
[facility]  [address] [place], [place] [address] Phone: [phone]  Fax: [phone]   Office Visit:  07/27/20  Referring physician: Jerrol Banana.,*  Chief Complaint: Dwayne Thompson is a 74 y.o. male with extensive stage small cell lung cancer who is seen for assessment prior to cycle #2 carboplatin, etoposide, and Tecentriq.  HPI:  The patient was last seen in the medical oncology clinic on 07/15/2020. At that time, he felt "pretty good."  He had very little pain. He had been taking Megace for 4 days; his appetite had improved a little bit.  Pleurx catheter was draining little. Hematocrit was 28.1, hemoglobin 9.5, MCV 86.2, platelets 85,000, WBC 10,500 (ANC 8,700). Sodium was 128. Chloride was 94. Calcium was 8.4. Dr. Holley Raring was consulted regarding the patient's hyponatremia. Magnesium was 2.1. Home health PT and OT was consulted.  The patient saw Dr. Genevive Bi on 07/20/2020. His Pleurx catheter was drained ; there was about 125 cc of serosanguineous drainage. His and his wife were instructed to drain the catheter every other day and they will decide when it should be removed at the next visit.  The patient was admitted to [facility] from 07/21/2020 - 07/25/2020. He presented with worsening shortness of breath x a few days. Bilateral lower extremity venous duplex showed bilateral DVT involving the popliteal and calf veins. Chest CT angiogram showed pulmonary emboli within all lobes of the right lung. He underwent mechanical thrombectomy of the right pulmonary arteries by Dr. Lucky Cowboy on 07/22/2020. He was started on heparin and then transitioned to Eliquis. He was also started on vancomycin and Zosyn for postobstructive pneumonia and was de-escalated to Augmentin on discharge. He was discharged on 3 liters/min nasal cannula oxygen.  Echo on 07/22/2020 revealed an EF of 65-70%.  Labs followed: 07/21/2020: Hematocrit 30.6, hemoglobin 10.2, MCV 88.2, platelets  133,000, WBC 18,000. 07/25/2020: Hematocrit 22.3, hemoglobin   7.3, MCV 87.8, platelets 159,000, WBC 23,300.  During the interim, he has been "ok".  He was nauseous this morning. He reports urinary frequency and urgency. He reports shortness of breath on exertion. He is on 3 liters/min of oxygen. He takes oxycodone 15 mg q 4 hours for left side pain. He has had mild nose bleeds from the right side.The patient denies diarrhea and fevers.   His appetite has improved a little bit but he does not drink a lot of fluid. He drinks some soda and coffee.  The patient is taking Augmentin but he states that he did not know he had pneumonia.  His wife has been helping him with his ADLs since his diagnosis. The patient and his wife have decided that they would like to move forward with treatment next week.   Past Medical History:  Diagnosis Date  . Anemia   . Back pain   . BPH (benign prostatic hyperplasia)   . Cirrhosis ([place])   . COPD (chronic obstructive pulmonary disease) ([place])   . ED (erectile dysfunction)   . GERD (gastroesophageal reflux disease)   . Hyperlipidemia   . Hypertension    Past Surgical History:  Procedure Laterality Date  . COLONOSCOPY Left 01/03/2018   Procedure: COLONOSCOPY;  Surgeon: Virgel Manifold, MD;  Location: [facility] ENDOSCOPY;  Service: Endoscopy;  Laterality: Left;  . COLONOSCOPY WITH PROPOFOL N/A 04/05/2018   Procedure: COLONOSCOPY WITH PROPOFOL;  Surgeon: Virgel Manifold, MD;  Location: [facility] ENDOSCOPY;  Service: Endoscopy;  Laterality: N/A;  . CYSTOSCOPY WITH STENT PLACEMENT Right 09/26/2019  Procedure: CYSTOSCOPY WITH STENT PLACEMENT;  Surgeon: Royston Cowper, MD;  Location: [facility] ORS;  Service: Urology;  Laterality: Right;  . ENTEROSCOPY  01/03/2018   Procedure: ENTEROSCOPY;  Surgeon: Virgel Manifold, MD;  Location: [facility] ENDOSCOPY;  Service: Endoscopy;;  . ESOPHAGOGASTRODUODENOSCOPY N/A 01/02/2018   Procedure: ESOPHAGOGASTRODUODENOSCOPY (EGD);  Surgeon:  Virgel Manifold, MD;  Location: [facility] ENDOSCOPY;  Service: Endoscopy;  Laterality: N/A;  . ESOPHAGOGASTRODUODENOSCOPY (EGD) WITH PROPOFOL N/A 04/05/2018   Procedure: ESOPHAGOGASTRODUODENOSCOPY (EGD) WITH PROPOFOL;  Surgeon: Virgel Manifold, MD;  Location: [facility] ENDOSCOPY;  Service: Endoscopy;  Laterality: N/A;  . exploration and neurolysis of right peroneal nerve at the knee Right 08/27/2018  . GREEN LIGHT LASER TURP (TRANSURETHRAL RESECTION OF PROSTATE N/A 09/26/2019   Procedure: GREEN LIGHT LASER TURP (TRANSURETHRAL RESECTION OF PROSTATE;  Surgeon: Royston Cowper, MD;  Location: [facility] ORS;  Service: Urology;  Laterality: N/A;  . HERNIA REPAIR     1980's  . PORTACATH PLACEMENT Left 06/29/2020   Procedure: INSERTION PORT-A-CATH;  Surgeon: [name], MD;  Location: [facility] ORS;  Service: Thoracic;  Laterality: Left;  . PULMONARY THROMBECTOMY N/A 07/22/2020   Procedure: PULMONARY THROMBECTOMY;  Surgeon: Algernon Huxley, MD;  Location: [place] CV LAB;  Service: Cardiovascular;  Laterality: N/A;  . VIDEO ASSISTED THORACOSCOPY (VATS)/THOROCOTOMY Left 06/29/2020   Procedure: VIDEO ASSISTED THORACOSCOPY;  Surgeon: [name], MD;  Location: [facility] ORS;  Service: Thoracic;  Laterality: Left;   Family History  Problem Relation Age of Onset  . Alzheimer's disease Mother   . Sarcoidosis Sister   . Anxiety disorder Brother   . Kidney disease Neg Hx   . Prostate cancer Neg Hx     Social History: reports that he quit smoking about 6 years ago. His smoking use included cigarettes. He has a 50.00 pack-year smoking history. He has never used smokeless tobacco. He reports current alcohol use of about 21.0 - 28.0 standard drinks of alcohol per week. He reports that he does not use drugs. He drinks 2-3 alcohol shots/day. His wife's name isJanice and granddaughter's name isAriel. The patient is accompanied by his wife and [facility], nurse navigator today.  Allergies:  Allergies  Allergen  Reactions  . Indomethacin Swelling    ankle swelling  . Tamsulosin Hcl Hives  . Sulfa Antibiotics Rash   Current Outpatient Medications  Medication Instructions  . acetaminophen (TYLENOL) 650 mg, Oral, 3 times daily  . amoxicillin-clavulanate (AUGMENTIN) 875-125 MG tablet 1 tablet, Oral, Every 12 hours  . apixaban (ELIQUIS) 5 MG TABS tablet Take 10mg  (2 tabs) twice daily for 7 days. Then on 8th day, take 5mg  (1 tab) twice daily.  . cholecalciferol (VITAMIN D3) 1,000 Units, Oral, Daily  . cyanocobalamin 1,000 mcg, Oral, Daily  . dutasteride (AVODART) 0.5 mg, Oral, Daily  . feeding supplement (ENSURE ENLIVE / ENSURE PLUS) LIQD 237 mLs, Oral, 2 times daily between meals  . folic acid (FOLVITE) 1 mg, Oral, Daily  . lactulose (CHRONULAC) 30 g, Oral, 2 times daily  . lidocaine (LIDODERM) 5 % 1 patch, Transdermal, Every 24 hours, Remove & Discard patch within 12 hours or as directed by MD  . Lidocaine (SALONPAS PAIN RELIEVING) 4 % PTCH 1 patch, Apply externally,  Every morning - 10a  . loratadine (CLARITIN) 10 mg, Oral, Daily  . megestrol (MEGACE) 200 mg, Oral, Daily  . midodrine (PROAMATINE) 10 mg, Oral, 4 times daily  . montelukast (SINGULAIR) 10 MG tablet TAKE 1 TABLET BY MOUTH EVERY DAY  .  ondansetron (ZOFRAN) 8 mg, Oral, 2 times daily PRN, Start on day 3 after carboplatin chemo.  Marland Kitchen oxyCODONE (ROXICODONE) 15 mg, Oral, Every 4 hours PRN  . pantoprazole (PROTONIX) 20 MG tablet TAKE ONE TABLET BY MOUTH TWICE A DAY  . polyethylene glycol (MIRALAX / GLYCOLAX) 17 g, Oral, Daily PRN  . potassium chloride (K-DUR,KLOR-CON) 10 MEQ tablet 10 mEq, Oral, 2 times daily  . prochlorperazine (COMPAZINE) 10 mg, Oral, Every 6 hours PRN  . rosuvastatin (CRESTOR) 10 mg, Oral, Daily    Review of Systems  Constitutional: Negative for chills, diaphoresis, fever, malaise/fatigue and weight loss (up 2 lbs).       Feels ok".  HENT: Positive for nosebleeds (right side, mild). Negative for congestion, ear  discharge, ear pain, hearing loss, sinus pain, sore throat and tinnitus.   Eyes: Negative for blurred vision.  Respiratory: Positive for shortness of breath (on exertion). Negative for cough, hemoptysis and sputum production.        Left-sided chest wall pain. On 3 liters oxygen.  Cardiovascular: Negative for chest pain, palpitations and leg swelling.  Gastrointestinal: Positive for constipation (takes Miralax BID) and nausea (this morning). Negative for abdominal pain, blood in stool, diarrhea, heartburn, melena and vomiting.       Appetite improved.  Genitourinary: Positive for frequency and urgency. Negative for dysuria and hematuria.  Musculoskeletal: Negative for back pain, joint pain, myalgias and neck pain.  Skin: Negative for itching and rash.  Neurological: Negative for dizziness, tingling, sensory change, weakness and headaches.  Endo/Heme/Allergies: Does not bruise/bleed easily.  Psychiatric/Behavioral: Negative for depression and memory loss. The patient is not nervous/anxious and does not have insomnia.   All other systems reviewed and are negative.   Performance status: 2-3  Vital Signs Blood pressure 106/67, pulse (!) 115, temperature (!) 97.1 F (36.2 C), temperature source Tympanic, resp. rate 18, height 5\' 11"  (1.803 m), weight 178 lb (80.7 kg), SpO2 93 %.  Physical Exam Vitals and nursing note reviewed.  Constitutional:      Appearance: He is not diaphoretic.     Comments: Patient sitting in a wheelchair in no acute distress.  HENT:     Head: Normocephalic and atraumatic.     Comments:  Short graying hair    Nose:     Comments: [place] in place.    Mouth/Throat:     Mouth: Mucous membranes are moist.     Pharynx: Oropharynx is clear.  Eyes:     General: No scleral icterus.    Extraocular Movements: Extraocular movements intact.     Conjunctiva/sclera: Conjunctivae normal.     Pupils: Pupils are equal, round, and reactive to light.     Comments: Glasses. Brown  eyes.   Cardiovascular:     Rate and Rhythm: Normal rate and regular rhythm.     Heart sounds: Normal heart sounds. No murmur heard.   Pulmonary:     Effort: Pulmonary effort is normal. No respiratory distress.     Breath sounds: Normal breath sounds. No wheezing or rales.     Comments: Decreased breath sounds LLL. Chest:     Chest wall: No tenderness.  Abdominal:     General: Bowel sounds are normal. There is no distension.     Palpations: Abdomen is soft. There is no mass.     Tenderness: There is no abdominal tenderness. There is no guarding or rebound.  Musculoskeletal:        General: No swelling or tenderness. Normal range of motion.  Cervical back: Normal range of motion and neck supple.     Right lower leg: Edema (2+, worse in feet) present.     Left lower leg: Edema (2+, worse in feet) present.  Lymphadenopathy:     Head:     Right side of head: No preauricular, posterior auricular or occipital adenopathy.     Left side of head: No preauricular, posterior auricular or occipital adenopathy.     Cervical: No cervical adenopathy.     Upper Body:     Right upper body: No supraclavicular or axillary adenopathy.     Left upper body: No supraclavicular or axillary adenopathy.     Lower Body: No right inguinal adenopathy. No left inguinal adenopathy.  Skin:    General: Skin is warm and dry.  Neurological:     Mental Status: He is alert and oriented to person, place, and time.  Psychiatric:        Behavior: Behavior normal.        Thought Content: Thought content normal.        Judgment: Judgment normal.    Results for orders placed or performed in visit on 07/27/20 (from the past 48 hour(s))  CBC with Differential     Status: Abnormal (Preliminary result)   Collection Time: 07/27/20  8:47 AM  Result Value Ref Range   WBC 21.5 (H) 4.0 - 10.5 K/uL   RBC 3.06 (L) 4.22 - 5.81 MIL/uL   Hemoglobin 9.1 (L) 13.0 - 17.0 g/dL   HCT 26.9 (L) 39 - 52 %   MCV 87.9 80.0 - 100.0  fL   MCH 29.7 26.0 - 34.0 pg   MCHC 33.8 30.0 - 36.0 g/dL   RDW 15.3 11.5 - 15.5 %   Platelets 210 150 - 400 K/uL   nRBC 0.0 0.0 - 0.2 %    Comment: Performed at [facility], [address] [place], [place] [address]   Neutrophils Relative % PENDING %   Neutro Abs PENDING 1.7 - 7.7 K/uL   Band Neutrophils PENDING %   Lymphocytes Relative PENDING %   Lymphs Abs PENDING 0.7 - 4.0 K/uL   Monocytes Relative PENDING %   Monocytes Absolute PENDING 0.1 - 1.0 K/uL   Eosinophils Relative PENDING %   Eosinophils Absolute PENDING 0.0 - 0.5 K/uL   Basophils Relative PENDING %   Basophils Absolute PENDING 0.0 - 0.1 K/uL   WBC Morphology PENDING    RBC Morphology PENDING    Smear Review PENDING    Other PENDING %   nRBC PENDING 0 /100 WBC   Metamyelocytes Relative PENDING %   Myelocytes PENDING %   Promyelocytes Relative PENDING %   Blasts PENDING %   Immature Granulocytes PENDING %   Abs Immature Granulocytes PENDING 0.00 - 0.07 K/uL  Comprehensive metabolic panel     Status: Abnormal (Preliminary result)   Collection Time: 07/27/20  8:47 AM  Result Value Ref Range   Sodium PENDING 135 - 145 mmol/L   Potassium PENDING 3.5 - 5.1 mmol/L   Chloride PENDING 98 - 111 mmol/L   CO2 PENDING 22 - 32 mmol/L   Glucose, Bld 138 (H) 70 - 99 mg/dL    Comment: Glucose reference range applies only to samples taken after fasting for at least 8 hours.   BUN 13 8 - 23 mg/dL   Creatinine, Ser PENDING 0.61 - 1.24 mg/dL   Calcium PENDING 8.9 - 10.3 mg/dL   Total Protein 7.0 6.5 -  8.1 g/dL   Albumin 3.4 (L) 3.5 - 5.0 g/dL   AST 35 15 - 41 U/L   ALT 18 0 - 44 U/L   Alkaline Phosphatase 88 38 - 126 U/L   Total Bilirubin 0.5 0.3 - 1.2 mg/dL    Comment: Performed at [facility], [address], [place], [place] [address]   GFR, Estimated PENDING >60 mL/min   Anion gap PENDING 5 - 15   No results found.  Assessment:  AHMARI DUERSON is a 74 y.o. male with extensive stage small  cell lung cancer s/p left chest wall/pleural biopsy on 06/24/2020.  He presented with left hemothorax. Thoracentesis x2 (05/20/2020 and 06/05/2020) revealed bloody fluid with no evidence of malignancy.  He has a chest tube in place.  Chest CT on 06/05/2020 revealed pleural thickening, nodularity and pleural-based mass with central adenopathy versus mass suspicious for pulmonary neoplasm with extensive pleural metastasis.  Mesothelioma is in the differential.  There was increasing left subcarinal lymph node use into the left mainstem bronchus.  There was increasing upper abdominal lymph nodes.  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  There was accentuated activity along the consolidative and atelectatic left lower lobe probably from tumor within the lung but potentially from tumor along the visceral pleura.  There was hypermetabolic thoracic adenopathy along with hypermetabolic upper abdominal adenopathy and hypermetabolic skeletal metastatic lesions (right iliac bone SUV 11.9 and T1). There was possible early tumor extension into the left T11-12 neuroforamen with some erosion of the adjacent T11 vertebral body.  Thoracic spine MRI with and without contrast on 06/28/2020 revealed a left paraspinous soft tissue mass extending from T9-10 through T12-L1, with invasion/involvement of the adjacent T10 and T11 vertebral bodies. Tumor partially extended into the left neural foramen at T11-12. There was no other significant extension into the spinal canal or epidural tumor. There were additional metastatic lesions involving the T1 and T10 vertebral bodies as well as the right pedicle of T6. There was no associated pathologic fracture. There was a heterogeneous and partially loculated left pleural effusion with consolidation signal intensity throughout the visualized left lower lung.  Brain MRI with and without  contrast on 06/28/2020 revealed no evidence for intracranial metastatic disease. There was mild to moderate chronic microvascular ischemic disease.  He is s/p cycle #1 carboplatin, etoposide and Tecentriq with Neulasta support (07/06/2020).  He was admitted to [facility] from 07/21/2020 - 07/25/2020 with worsening shortness of breath x a few days. Bilateral lower extremity venous duplex showed bilateral DVT involving the popliteal and calf veins. Chest CT angiogram showed pulmonary emboli within all lobes of the right lung. He underwent mechanical thrombectomy of the right pulmonary arteries. He was started on heparin and then transitioned to Eliquis. He was also started on vancomycin and Zosyn for postobstructive pneumonia and was de-escalated to Augmentin on discharge. He was discharged on 3 liters/min nasal cannula oxygen.  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 He previously alcohol daily.  Symptomatically, he feels "ok".  He was nauseous this morning. He has shortness of breath on exertion; he is on 3 liters/min of oxygen. His appetite has improved a little bit; he does not drink a lot of fluid. He is on Augmentin for pneumonia.  He wishes to continue treatment.  Plan:   1.   Labs today:  CBC with diff, CMP,  Mg, TSH, free T4, B12, folate. 2.   Extensive stage small cell lung cancer             PET scan on 06/12/2020 revealed pleural based disease, hypermetabolic adenopathy, and skeletal metastasis.  Head MRI  revealed no evidence of metastatic disase.  He is s/p 1 cycle of carboplatin, etoposide and Tecentriq with Neulasta support (07/06/2020).   He tolerated his chemotherapy well with manageable nausea.  Chest CT angiogram on 07/22/2020 revealed worsening mediastinal and upper abdominal adenopathy.   Images were compared to the PET scan on 06/05/2020, approximately 1 month before initiation of treatment.   He has rapidly growing disease.  We reviewed  the plan for an additional cycle of chemotherapy then reimaging.   Discuss plan for cycle #2 in 1 week after completion of antibiotics and improvement in strength.   Patient in agreement.  Discuss symptom management.  He has antiemetics and pain medications at home to use on a prn bases.  Interventions are adequate. 3.   Recurrent pleural effusion  Pleurx catheter remains in place.  Catheter is being drained every other day.  Patient to follow-up with Dr. Genevive Bi. 4.   Normocytic anemia             Hematocrit 26.9.  Hemoglobin 0.1.  MCV 87.9 today.   Ferritin 359 with an iron saturation of 14% (low) and TIBC 181 on 06/27/2020.   Folate 3.5 mg today.  Continue folic acid 1 mg a day.     Repeat level in 1 month.         5.   Cancer-related pain             Pain appears fairly well controlled with oxycodone 15 mg p.o. every 4 hours as needed pain.  Continue to monitor. 6.   Poor appetite  Discuss importance of fluid and caloric intake. 7.   Debilitation  Assist with setting up home health.   Obtain humidifier for oxygen at home. 8.   Pneumonia  Patient on oxygen 3 liters/min.  Complete course of Augmentin. 9.   Bilateral DVT and right sided pulmonary emboli  Bilateral lower extremity duplex on 07/21/2020 revealed bilateral popliteal and calf vein DVTs.   Chest CT angiogram on 07/22/2020 showed pulmonary emboli within all lobes of the right lung.   He is s/p mechanical thrombectomy of the right pulmonary arteries.   He is on Eliquis. 10.   No chemotherapy today. 11.   RTC in 1 week for MD assessment, labs (CBC with diff, CMP, Mg) and cycle #2 carboplatin, etoposide and Tecentriq.  I discussed the assessment and treatment plan with the patient.  The patient was provided an opportunity to ask questions and all were answered.  The patient agreed with the plan and demonstrated an understanding of the instructions.  The patient was advised to call back or seek an in person evaluation if the  symptoms worsen or if the condition fails to improve as anticipated.  I provided 27 minutes of face-to-face time during this this encounter and > 50% was spent counseling as documented under my assessment and plan.  An additional 8 minutes were spent reviewing his chart (Epic and Care Everywhere) including notes, labs, and imaging studies.    Nolon Stalls, MD, PhD  07/27/2020, 4:28 PM   I, De Burrs, am acting as a scribe for Lequita Asal, MD.  I, [place] Mike Gip, MD, have reviewed the above documentation for accuracy and completeness, and I  agree with the above.

## 2020-07-27 ENCOUNTER — Encounter: Payer: Self-pay | Admitting: *Deleted

## 2020-07-27 ENCOUNTER — Telehealth: Payer: Self-pay

## 2020-07-27 ENCOUNTER — Inpatient Hospital Stay: Payer: Medicare Other

## 2020-07-27 ENCOUNTER — Other Ambulatory Visit: Payer: Self-pay

## 2020-07-27 ENCOUNTER — Encounter: Payer: Self-pay | Admitting: Hematology and Oncology

## 2020-07-27 ENCOUNTER — Inpatient Hospital Stay: Payer: Medicare Other | Attending: Hematology and Oncology | Admitting: Hematology and Oncology

## 2020-07-27 VITALS — BP 106/67 | HR 115 | Temp 97.1°F | Resp 18 | Ht 71.0 in | Wt 178.0 lb

## 2020-07-27 DIAGNOSIS — Z5111 Encounter for antineoplastic chemotherapy: Secondary | ICD-10-CM | POA: Diagnosis present

## 2020-07-27 DIAGNOSIS — Z5112 Encounter for antineoplastic immunotherapy: Secondary | ICD-10-CM | POA: Insufficient documentation

## 2020-07-27 DIAGNOSIS — N400 Enlarged prostate without lower urinary tract symptoms: Secondary | ICD-10-CM | POA: Insufficient documentation

## 2020-07-27 DIAGNOSIS — I2699 Other pulmonary embolism without acute cor pulmonale: Secondary | ICD-10-CM | POA: Diagnosis not present

## 2020-07-27 DIAGNOSIS — D6481 Anemia due to antineoplastic chemotherapy: Secondary | ICD-10-CM | POA: Insufficient documentation

## 2020-07-27 DIAGNOSIS — Z79899 Other long term (current) drug therapy: Secondary | ICD-10-CM | POA: Diagnosis not present

## 2020-07-27 DIAGNOSIS — G893 Neoplasm related pain (acute) (chronic): Secondary | ICD-10-CM | POA: Insufficient documentation

## 2020-07-27 DIAGNOSIS — Z86718 Personal history of other venous thrombosis and embolism: Secondary | ICD-10-CM | POA: Diagnosis not present

## 2020-07-27 DIAGNOSIS — Z87891 Personal history of nicotine dependence: Secondary | ICD-10-CM | POA: Diagnosis not present

## 2020-07-27 DIAGNOSIS — C778 Secondary and unspecified malignant neoplasm of lymph nodes of multiple regions: Secondary | ICD-10-CM | POA: Diagnosis not present

## 2020-07-27 DIAGNOSIS — E860 Dehydration: Secondary | ICD-10-CM | POA: Diagnosis not present

## 2020-07-27 DIAGNOSIS — I82433 Acute embolism and thrombosis of popliteal vein, bilateral: Secondary | ICD-10-CM | POA: Diagnosis not present

## 2020-07-27 DIAGNOSIS — T451X5A Adverse effect of antineoplastic and immunosuppressive drugs, initial encounter: Secondary | ICD-10-CM | POA: Insufficient documentation

## 2020-07-27 DIAGNOSIS — Z86711 Personal history of pulmonary embolism: Secondary | ICD-10-CM | POA: Diagnosis not present

## 2020-07-27 DIAGNOSIS — Z7901 Long term (current) use of anticoagulants: Secondary | ICD-10-CM | POA: Diagnosis not present

## 2020-07-27 DIAGNOSIS — C3490 Malignant neoplasm of unspecified part of unspecified bronchus or lung: Secondary | ICD-10-CM | POA: Insufficient documentation

## 2020-07-27 DIAGNOSIS — E871 Hypo-osmolality and hyponatremia: Secondary | ICD-10-CM | POA: Diagnosis not present

## 2020-07-27 DIAGNOSIS — Z7189 Other specified counseling: Secondary | ICD-10-CM | POA: Insufficient documentation

## 2020-07-27 DIAGNOSIS — Z95828 Presence of other vascular implants and grafts: Secondary | ICD-10-CM

## 2020-07-27 DIAGNOSIS — K219 Gastro-esophageal reflux disease without esophagitis: Secondary | ICD-10-CM | POA: Diagnosis not present

## 2020-07-27 DIAGNOSIS — C7951 Secondary malignant neoplasm of bone: Secondary | ICD-10-CM

## 2020-07-27 DIAGNOSIS — D649 Anemia, unspecified: Secondary | ICD-10-CM

## 2020-07-27 DIAGNOSIS — Z9981 Dependence on supplemental oxygen: Secondary | ICD-10-CM | POA: Diagnosis not present

## 2020-07-27 DIAGNOSIS — E785 Hyperlipidemia, unspecified: Secondary | ICD-10-CM | POA: Diagnosis not present

## 2020-07-27 DIAGNOSIS — K5903 Drug induced constipation: Secondary | ICD-10-CM | POA: Diagnosis not present

## 2020-07-27 DIAGNOSIS — J449 Chronic obstructive pulmonary disease, unspecified: Secondary | ICD-10-CM | POA: Insufficient documentation

## 2020-07-27 DIAGNOSIS — I10 Essential (primary) hypertension: Secondary | ICD-10-CM | POA: Insufficient documentation

## 2020-07-27 DIAGNOSIS — J189 Pneumonia, unspecified organism: Secondary | ICD-10-CM

## 2020-07-27 LAB — COMPREHENSIVE METABOLIC PANEL
ALT: 18 U/L (ref 0–44)
AST: 35 U/L (ref 15–41)
Albumin: 3.4 g/dL — ABNORMAL LOW (ref 3.5–5.0)
Alkaline Phosphatase: 88 U/L (ref 38–126)
Anion gap: 9 (ref 5–15)
BUN: 13 mg/dL (ref 8–23)
CO2: 20 mmol/L — ABNORMAL LOW (ref 22–32)
Calcium: 9.2 mg/dL (ref 8.9–10.3)
Chloride: 107 mmol/L (ref 98–111)
Creatinine, Ser: 0.91 mg/dL (ref 0.61–1.24)
GFR, Estimated: >60 mL/min (ref >60)
Glucose, Bld: 138 mg/dL — ABNORMAL HIGH (ref 70–99)
Potassium: 3.7 mmol/L (ref 3.5–5.1)
Sodium: 136 mmol/L (ref 135–145)
Total Bilirubin: 0.5 mg/dL (ref 0.3–1.2)
Total Protein: 7 g/dL (ref 6.5–8.1)

## 2020-07-27 LAB — BLOOD GAS, VENOUS
Acid-Base Excess: 1.1 mmol/L (ref 0.0–2.0)
Bicarbonate: 26 mmol/L (ref 20.0–28.0)
O2 Saturation: 34.7 %
Patient temperature: 37
pCO2, Ven: 42 mmHg — ABNORMAL LOW (ref 44.0–60.0)
pH, Ven: 7.4 (ref 7.250–7.430)

## 2020-07-27 LAB — CBC WITH DIFFERENTIAL/PLATELET
Abs Immature Granulocytes: 1.7 10*3/uL — ABNORMAL HIGH (ref 0.00–0.07)
Basophils Absolute: 0.1 10*3/uL (ref 0.0–0.1)
Basophils Relative: 0 %
Eosinophils Absolute: 0 10*3/uL (ref 0.0–0.5)
Eosinophils Relative: 0 %
HCT: 26.9 % — ABNORMAL LOW (ref 39.0–52.0)
Hemoglobin: 9.1 g/dL — ABNORMAL LOW (ref 13.0–17.0)
Immature Granulocytes: 8 %
Lymphocytes Relative: 10 %
Lymphs Abs: 2.2 10*3/uL (ref 0.7–4.0)
MCH: 29.7 pg (ref 26.0–34.0)
MCHC: 33.8 g/dL (ref 30.0–36.0)
MCV: 87.9 fL (ref 80.0–100.0)
Monocytes Absolute: 1.2 10*3/uL — ABNORMAL HIGH (ref 0.1–1.0)
Monocytes Relative: 6 %
Neutro Abs: 16.2 10*3/uL — ABNORMAL HIGH (ref 1.7–7.7)
Neutrophils Relative %: 76 %
Platelets: 210 10*3/uL (ref 150–400)
RBC: 3.06 MIL/uL — ABNORMAL LOW (ref 4.22–5.81)
RDW: 15.3 % (ref 11.5–15.5)
WBC: 21.5 10*3/uL — ABNORMAL HIGH (ref 4.0–10.5)
nRBC: 0 % (ref 0.0–0.2)

## 2020-07-27 LAB — VITAMIN B12: Vitamin B-12: 927 pg/mL — ABNORMAL HIGH (ref 180–914)

## 2020-07-27 LAB — TSH: TSH: 1.864 u[IU]/mL (ref 0.350–4.500)

## 2020-07-27 LAB — FOLATE: Folate: 3.5 ng/mL — ABNORMAL LOW (ref >5.9)

## 2020-07-27 MED ORDER — SODIUM CHLORIDE 0.9% FLUSH
10.0000 mL | INTRAVENOUS | Status: DC | PRN
  Administered 2020-07-27: 10 mL via INTRAVENOUS
  Filled 2020-07-27: qty 10

## 2020-07-27 MED ORDER — HEPARIN SOD (PORK) LOCK FLUSH 100 UNIT/ML IV SOLN
500.0000 [IU] | Freq: Once | INTRAVENOUS | Status: AC
  Administered 2020-07-27: 500 [IU] via INTRAVENOUS
  Filled 2020-07-27: qty 5

## 2020-07-27 NOTE — Telephone Encounter (Signed)
Transition Care Management Follow-up Telephone Call  Date of discharge and from where: [facility] on 07/25/20  How have you been since you were released from the hospital? Doing ok, does not feel any better or worse. Pt's chemo treatment has been delayed until next week due to being on antibiotics due to pneumonia. Pt is still SOB with exertion. Pulse ox reading was 93% this am at cancer center. Pt also states he has seen blood when he blows his nose but not much. Pt states he does not have energy and his appetite is better but still not good. Pt is scheduled to see a dietician on 08/24/20. Declines cough, sputum, fever or n/v/d.  Any questions or concerns? Wife was not pleased with care that pt received at [facility]. Oxygen was turned off and he called for help but no one would come.   Items Reviewed:  Did the pt receive and understand the discharge instructions provided? Yes   Medications obtained and verified? Yes   Any new allergies since your discharge? No   Dietary orders reviewed? N/A  Do you have support at home? Yes   Other (ie: DME, Home Health, etc): Oxygen and HH was ordered.  Functional Questionnaire: (I = Independent and D = Dependent)  Bathing/Dressing- D, wife assists.   Meal Prep- I  Eating- I  Maintaining continence- D, wears depends.  Transferring/Ambulation- D, uses a cane and walker  Managing Meds- D, wife manages medications.   Follow up appointments reviewed:    PCP Hospital f/u appt confirmed? Yes  scheduled to see Dr Rosanna Randy on 08/06/20 @ 4:00 PM.  [facility] f/u appt confirmed? Yes    Are transportation arrangements needed? No   If their condition worsens, is the pt aware to call  their PCP or go to the ED? Yes  Was the patient provided with contact information for the PCP's office or ED? Yes  Was the pt encouraged to call back with questions or concerns? Yes

## 2020-07-27 NOTE — Progress Notes (Signed)
No treatment today pr Dr. Mike Gip; port a cath de accessed; patient discharge to home with his wife in stable condition via wheelchair.

## 2020-07-27 NOTE — Telephone Encounter (Signed)
The 18th is ok. thx

## 2020-07-27 NOTE — Telephone Encounter (Signed)
Tried calling patient regarding recent lab results and provider's recommendations. No answer. LMOM for patient to return call to office.

## 2020-07-27 NOTE — Progress Notes (Signed)
  Oncology Nurse Navigator Documentation  Navigator Location: [facility] (07/27/20 1500)   )Navigator Encounter Type: Follow-up Appt (07/27/20 1500)                     Patient Visit Type: MedOnc (07/27/20 1500)   Barriers/Navigation Needs: No Needs;No Questions (07/27/20 1500)   Interventions: None Required (07/27/20 1500)           met with patient and his wife during hospital follow up appt with Dr. Mike Gip. All questions answered during visit. Reassurance provided. Nothing further needed at this time. Instructed pt to call with any further questions or needs. Pt verbalized understanding.           Time Spent with Patient: 30 (07/27/20 1500)

## 2020-07-27 NOTE — Telephone Encounter (Signed)
  Please follow-up tomorrow with him.  M

## 2020-07-27 NOTE — Progress Notes (Signed)
SOB with movement

## 2020-07-27 NOTE — Telephone Encounter (Signed)
No HFU is scheduled, however a 3 week f/u is scheduled for 08/06/20 @ 4:00 PM.

## 2020-07-28 ENCOUNTER — Telehealth (INDEPENDENT_AMBULATORY_CARE_PROVIDER_SITE_OTHER): Payer: Medicare Other | Admitting: Cardiothoracic Surgery

## 2020-07-28 ENCOUNTER — Ambulatory Visit
Admission: RE | Admit: 2020-07-28 | Discharge: 2020-07-28 | Disposition: A | Discharge status: Home / Self Care | Payer: Medicare Other | Attending: Cardiothoracic Surgery | Admitting: Cardiothoracic Surgery

## 2020-07-28 ENCOUNTER — Ambulatory Visit
Admission: RE | Admit: 2020-07-28 | Discharge: 2020-07-28 | Disposition: A | Discharge status: Home / Self Care | Payer: Medicare Other | Source: Ambulatory Visit | Attending: Cardiothoracic Surgery | Admitting: Cardiothoracic Surgery

## 2020-07-28 ENCOUNTER — Other Ambulatory Visit: Payer: Self-pay

## 2020-07-28 ENCOUNTER — Telehealth: Payer: Self-pay

## 2020-07-28 ENCOUNTER — Ambulatory Visit: Payer: Medicare Other

## 2020-07-28 ENCOUNTER — Ambulatory Visit: Payer: Self-pay | Admitting: Family Medicine

## 2020-07-28 DIAGNOSIS — J90 Pleural effusion, not elsewhere classified: Secondary | ICD-10-CM | POA: Insufficient documentation

## 2020-07-28 LAB — T4: T4, Total: 11.7 ug/dL (ref 4.5–12.0)

## 2020-07-28 NOTE — Telephone Encounter (Signed)
Spoke with the patient wife to inform her that the patient ferritin levels are low and he need to start Folic acid 1 mg daily. Repeat folate levels in 1 month. The patient has been schedule for labs.

## 2020-07-28 NOTE — Telephone Encounter (Signed)
-----   Message from Lequita Asal, MD sent at 07/27/2020  2:26 PM EST ----- Regarding: Please call patient  Folate is low.  Begin folic acid 1 mg a day.    Check folate in 1 month.  M ----- Message ----- From: Interface, Lab In [place] Sent: 07/27/2020   9:00 AM EST To: Lequita Asal, MD

## 2020-07-28 NOTE — Progress Notes (Signed)
  A virtual visit was performed today with the patient on a speaker phone with his wife and his sister-in-law in the background.  He was at home.  I was in my office.  They understood that this was a virtual visit which has its inherent risks.  They also understand that he can be seen anytime if he so chooses.  The patient states that he was discharged from the hospital on Saturday and had his Pleurx catheter drained once on Saturday of 250 cc.  They have not drained the catheter since then.  They are going to drain it today.  He did have a chest x-ray made today which shows a small amount of fluid on the left side.  The haziness is most likely related to atelectasis and/or tumor with some small component of fluid.  I instructed the patient and his family to continue draining the pleural space every 2 to 3 days.  They should keep a record of the amount of drainage and contact my office in about a week or so to review that.  He is taking Eliquis now.  We would need to stop that prior to removal of his Pleurx catheter.  All questions were answered.  A return visit was not made at this point although the patient will call in a week or so.

## 2020-07-29 ENCOUNTER — Telehealth: Payer: Self-pay

## 2020-07-29 ENCOUNTER — Ambulatory Visit: Payer: Medicare Other

## 2020-07-29 NOTE — Telephone Encounter (Signed)
Spoke with Ms Lumm to inform her Dr Mike Gip she would like for Mr Bidinger to take Foilc acid 1000 mcg daily. and repeat labs in 1 month. The patient has been schedule for repaet labs on 08/26/2020 @ 9:30 AM. Ms Rolph was understanding and agreeable.

## 2020-07-30 NOTE — Progress Notes (Signed)
[facility]  [address] [place], [place] [address] Phone: [phone]  Fax: [phone]   Office Visit:  08/03/20  Referring physician: Jerrol Banana.,*  Chief Complaint: Dwayne Thompson is a 74 y.o. male with extensive stage small cell lung cancer who is seen for assessment prior to cycle #2 carboplatin, etoposide and Tecentriq.  HPI:  The patient was last seen in the medical oncology clinic on 07/27/2020. At that time, he had just been discharged from the hospital.  He was completing a course of Augmentin for pneumonia.  Hematocrit was 26.9, hemoglobin 9.1, MCV 87.9, platelets 210,000, WBC 21,500 (ANC 16,200).  Vitamin B12 was 927 and folate 3.5 (low). TSH was 1.864 and total T4 was 11.7. Decision was to postpone cycle #2 for 1 week.  The patient spoke to Dr. Genevive Bi via telemedicine on 07/28/2020. CXR revealed persistent left-sided pleural effusion and left lower lung zone airspace disease. Left-sided PleurX catheter remained in place. The patient was to continue draining his Pleurx catheter every 2-3 days and record the amount of fluid that drained. The patient was going to call back in about a week.  During the interim, he has been okay. He was weak this morning. He finished his antibiotics and denies fever and productive cough. He is eating a little bit better but his fluid intake is lacking. He lost 14 lbs over the past week. He has been eating small portions throughout the day and does not snack often. They have an appointment with Jennet Maduro, RD coming up.  When he blows his nose too hard, he gets mild nosebleeds. He gets short of breath on exertion. He has occasional nausea. His last bowel movement was 3 days ago.  On Thursday, his Pleurx catheter drained 50 cc. Yesterday, it only drained a drop. He has some pain in his left side. He is on 3 L of oxygen.  He is taking his blood pressure medication and folic acid. He takes oxycodone 4 times  per day. He sleeps well at night. He needs assistance with some of his ADLs.  He was visited by physical therapy on Friday and was given exercises to do.   The patient states that he wants to do "whatever it takes" to fight his disease. He is not sure about his code status at this time. He would like to proceed with treatment today.   Past Medical History:  Diagnosis Date  . Anemia   . Back pain   . BPH (benign prostatic hyperplasia)   . Cirrhosis ([place])   . COPD (chronic obstructive pulmonary disease) ([place])   . ED (erectile dysfunction)   . GERD (gastroesophageal reflux disease)   . Hyperlipidemia   . Hypertension    Past Surgical History:  Procedure Laterality Date  . COLONOSCOPY Left 01/03/2018   Procedure: COLONOSCOPY;  Surgeon: Virgel Manifold, MD;  Location: [facility] ENDOSCOPY;  Service: Endoscopy;  Laterality: Left;  . COLONOSCOPY WITH PROPOFOL N/A 04/05/2018   Procedure: COLONOSCOPY WITH PROPOFOL;  Surgeon: Virgel Manifold, MD;  Location: [facility] ENDOSCOPY;  Service: Endoscopy;  Laterality: N/A;  . CYSTOSCOPY WITH STENT PLACEMENT Right 09/26/2019   Procedure: CYSTOSCOPY WITH STENT PLACEMENT;  Surgeon: Royston Cowper, MD;  Location: [facility] ORS;  Service: Urology;  Laterality: Right;  . ENTEROSCOPY  01/03/2018   Procedure: ENTEROSCOPY;  Surgeon: Virgel Manifold, MD;  Location: [facility] ENDOSCOPY;  Service: Endoscopy;;  . ESOPHAGOGASTRODUODENOSCOPY N/A 01/02/2018   Procedure: ESOPHAGOGASTRODUODENOSCOPY (EGD);  Surgeon: Bonna Gains,  Lennette Bihari, MD;  Location: [facility] ENDOSCOPY;  Service: Endoscopy;  Laterality: N/A;  . ESOPHAGOGASTRODUODENOSCOPY (EGD) WITH PROPOFOL N/A 04/05/2018   Procedure: ESOPHAGOGASTRODUODENOSCOPY (EGD) WITH PROPOFOL;  Surgeon: Virgel Manifold, MD;  Location: [facility] ENDOSCOPY;  Service: Endoscopy;  Laterality: N/A;  . exploration and neurolysis of right peroneal nerve at the knee Right 08/27/2018  . GREEN LIGHT LASER TURP (TRANSURETHRAL RESECTION OF PROSTATE N/A  09/26/2019   Procedure: GREEN LIGHT LASER TURP (TRANSURETHRAL RESECTION OF PROSTATE;  Surgeon: Royston Cowper, MD;  Location: [facility] ORS;  Service: Urology;  Laterality: N/A;  . HERNIA REPAIR     1980's  . PORTACATH PLACEMENT Left 06/29/2020   Procedure: INSERTION PORT-A-CATH;  Surgeon: [name], MD;  Location: [facility] ORS;  Service: Thoracic;  Laterality: Left;  . PULMONARY THROMBECTOMY N/A 07/22/2020   Procedure: PULMONARY THROMBECTOMY;  Surgeon: Algernon Huxley, MD;  Location: [place] CV LAB;  Service: Cardiovascular;  Laterality: N/A;  . VIDEO ASSISTED THORACOSCOPY (VATS)/THOROCOTOMY Left 06/29/2020   Procedure: VIDEO ASSISTED THORACOSCOPY;  Surgeon: [name], MD;  Location: [facility] ORS;  Service: Thoracic;  Laterality: Left;   Family History  Problem Relation Age of Onset  . Alzheimer's disease Mother   . Sarcoidosis Sister   . Anxiety disorder Brother   . Kidney disease Neg Hx   . Prostate cancer Neg Hx     Social History: reports that he quit smoking about 6 years ago. His smoking use included cigarettes. He has a 50.00 pack-year smoking history. He has never used smokeless tobacco. He reports current alcohol use of about 21.0 - 28.0 standard drinks of alcohol per week. He reports that he does not use drugs. He drinks 2-3 alcohol shots/day. His wife's name isJanice and granddaughter's name isAriel. The patient is accompanied by his wife today.  Allergies:  Allergies  Allergen Reactions  . Indomethacin Swelling    ankle swelling  . Tamsulosin Hcl Hives  . Sulfa Antibiotics Rash   Current Outpatient Medications  Medication Instructions  . acetaminophen (TYLENOL) 650 mg, Oral, 3 times daily  . apixaban (ELIQUIS) 5 MG TABS tablet Take 10mg  (2 tabs) twice daily for 7 days. Then on 8th day, take 5mg  (1 tab) twice daily.  . cholecalciferol (VITAMIN D3) 1,000 Units, Oral, Daily  . cyanocobalamin 1,000 mcg, Oral, Daily  . dutasteride (AVODART) 0.5 mg, Oral, Daily  . feeding  supplement (ENSURE ENLIVE / ENSURE PLUS) LIQD 237 mLs, Oral, 2 times daily between meals  . folic acid (FOLVITE) 1 mg, Oral, Daily  . lactulose (CHRONULAC) 30 g, Oral, 2 times daily  . lidocaine (LIDODERM) 5 % 1 patch, Transdermal, Every 24 hours, Remove & Discard patch within 12 hours or as directed by MD  . Lidocaine (SALONPAS PAIN RELIEVING) 4 % PTCH 1 patch, Apply externally,  Every morning - 10a  . loratadine (CLARITIN) 10 mg, Oral, Daily  . megestrol (MEGACE) 200 mg, Oral, Daily  . midodrine (PROAMATINE) 10 mg, Oral, 4 times daily  . montelukast (SINGULAIR) 10 MG tablet TAKE 1 TABLET BY MOUTH EVERY DAY  . ondansetron (ZOFRAN) 8 mg, Oral, 2 times daily PRN, Start on day 3 after carboplatin chemo.  Marland Kitchen oxyCODONE (ROXICODONE) 15 mg, Oral, Every 4 hours PRN  . pantoprazole (PROTONIX) 20 MG tablet TAKE ONE TABLET BY MOUTH TWICE A DAY  . polyethylene glycol (MIRALAX / GLYCOLAX) 17 g, Oral, Daily PRN  . potassium chloride (K-DUR,KLOR-CON) 10 MEQ tablet 10 mEq, Oral, 2 times daily  . prochlorperazine (COMPAZINE) 10 mg,  Oral, Every 6 hours PRN  . rosuvastatin (CRESTOR) 10 mg, Oral, Daily    Review of Systems  Constitutional: Positive for weight loss (14 lbs). Negative for chills, diaphoresis, fever and malaise/fatigue.  HENT: Positive for nosebleeds (mild, when he blows his nose). Negative for congestion, ear discharge, ear pain, hearing loss, sinus pain, sore throat and tinnitus.   Eyes: Negative for blurred vision.  Respiratory: Positive for shortness of breath (on exertion). Negative for cough, hemoptysis and sputum production.        Left-sided chest wall pain. On 3 liters oxygen.  Cardiovascular: Negative for chest pain, palpitations and leg swelling.  Gastrointestinal: Positive for nausea (occasional). Negative for abdominal pain, blood in stool, constipation (takes miralax), diarrhea, heartburn, melena and vomiting.       Appetite improved. Eating small portions. Fluid intake is lacking.    Genitourinary: Negative for dysuria, frequency, hematuria and urgency.  Musculoskeletal: Negative for back pain, joint pain, myalgias and neck pain.  Skin: Negative for itching and rash.  Neurological: Positive for weakness. Negative for dizziness, tingling, sensory change and headaches.  Endo/Heme/Allergies: Does not bruise/bleed easily.  Psychiatric/Behavioral: Negative for depression and memory loss. The patient is not nervous/anxious and does not have insomnia.   All other systems reviewed and are negative.   Performance status: 2-3  Vital Signs Blood pressure (!) 81/39, pulse (!) 105, temperature 97.6 F (36.4 C), temperature source Tympanic, resp. rate 18, height 5\' 11"  (1.803 m), weight 164 lb 14.4 oz (74.8 kg), SpO2 96 %.  Physical Exam Vitals and nursing note reviewed.  Constitutional:      Appearance: He is not diaphoretic.     Comments: Fatigued appearing gentleman sitting comfortably in a wheelchair in no acute distress.  HENT:     Head: Normocephalic and atraumatic.     Comments:  Short graying hair    Mouth/Throat:     Mouth: Mucous membranes are moist.     Pharynx: Oropharynx is clear.  Eyes:     General: No scleral icterus.    Extraocular Movements: Extraocular movements intact.     Conjunctiva/sclera: Conjunctivae normal.     Pupils: Pupils are equal, round, and reactive to light.     Comments: Glasses. Brown eyes.   Cardiovascular:     Rate and Rhythm: Normal rate and regular rhythm.     Heart sounds: Normal heart sounds. No murmur heard.   Pulmonary:     Effort: Pulmonary effort is normal. No respiratory distress.     Breath sounds: Normal breath sounds. No wheezing or rales.  Chest:     Chest wall: No tenderness.  Abdominal:     General: Bowel sounds are normal. There is no distension.     Palpations: Abdomen is soft. There is no mass.     Tenderness: There is no abdominal tenderness. There is no guarding or rebound.  Musculoskeletal:        General:  No swelling or tenderness. Normal range of motion.     Cervical back: Normal range of motion and neck supple.     Right lower leg: Edema (feet only) present.     Left lower leg: Edema (feet only) present.  Lymphadenopathy:     Head:     Right side of head: No preauricular, posterior auricular or occipital adenopathy.     Left side of head: No preauricular, posterior auricular or occipital adenopathy.     Cervical: No cervical adenopathy.     Upper Body:  Right upper body: No supraclavicular or axillary adenopathy.     Left upper body: No supraclavicular or axillary adenopathy.     Lower Body: No right inguinal adenopathy. No left inguinal adenopathy.  Skin:    General: Skin is warm and dry.  Neurological:     Mental Status: He is alert and oriented to person, place, and time.  Psychiatric:        Behavior: Behavior normal.        Thought Content: Thought content normal.        Judgment: Judgment normal.    Results for orders placed or performed in visit on 08/03/20 (from the past 48 hour(s))  CBC with Differential     Status: Abnormal   Collection Time: 08/03/20  8:24 AM  Result Value Ref Range   WBC 14.7 (H) 4.0 - 10.5 K/uL   RBC 3.12 (L) 4.22 - 5.81 MIL/uL   Hemoglobin 9.0 (L) 13.0 - 17.0 g/dL   HCT 27.4 (L) 39 - 52 %   MCV 87.8 80.0 - 100.0 fL   MCH 28.8 26.0 - 34.0 pg   MCHC 32.8 30.0 - 36.0 g/dL   RDW 16.1 (H) 11.5 - 15.5 %   Platelets 219 150 - 400 K/uL   nRBC 0.0 0.0 - 0.2 %   Neutrophils Relative % 77 %   Neutro Abs 11.4 (H) 1.7 - 7.7 K/uL   Lymphocytes Relative 11 %   Lymphs Abs 1.5 0.7 - 4.0 K/uL   Monocytes Relative 9 %   Monocytes Absolute 1.3 (H) 0.1 - 1.0 K/uL   Eosinophils Relative 0 %   Eosinophils Absolute 0.1 0.0 - 0.5 K/uL   Basophils Relative 0 %   Basophils Absolute 0.1 0.0 - 0.1 K/uL   Immature Granulocytes 3 %   Abs Immature Granulocytes 0.38 (H) 0.00 - 0.07 K/uL    Comment: Performed at [facility], [address]  [place], [place] [address]   No results found.  Assessment:  Dwayne Thompson is a 74 y.o. male with extensive stage small cell lung cancer s/p left chest wall/pleural biopsy on 06/24/2020.  He presented with left hemothorax. Thoracentesis x2 (05/20/2020 and 06/05/2020) revealed bloody fluid with no evidence of malignancy.  He has a chest tube in place.  Chest CT on 06/05/2020 revealed pleural thickening, nodularity and pleural-based mass with central adenopathy versus mass suspicious for pulmonary neoplasm with extensive pleural metastasis.  Mesothelioma is in the differential.  There was increasing left subcarinal lymph node use into the left mainstem bronchus.  There was increasing upper abdominal lymph nodes.  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  There was accentuated activity along the consolidative and atelectatic left lower lobe probably from tumor within the lung but potentially from tumor along the visceral pleura.  There was hypermetabolic thoracic adenopathy along with hypermetabolic upper abdominal adenopathy and hypermetabolic skeletal metastatic lesions (right iliac bone SUV 11.9 and T1). There was possible early tumor extension into the left T11-12 neuroforamen with some erosion of the adjacent T11 vertebral body.  Thoracic spine MRI with and without contrast on 06/28/2020 revealed a left paraspinous soft tissue mass extending from T9-10 through T12-L1, with invasion/involvement of the adjacent T10 and T11 vertebral bodies. Tumor partially extended into the left neural foramen at T11-12. There was no other significant extension into the spinal canal or epidural tumor. There were additional metastatic lesions involving the  T1 and T10 vertebral bodies as well as the right pedicle of T6. There was no associated pathologic fracture. There was a heterogeneous and partially loculated left pleural effusion  with consolidation signal intensity throughout the visualized left lower lung.  Brain MRI with and without contrast on 06/28/2020 revealed no evidence for intracranial metastatic disease. There was mild to moderate chronic microvascular ischemic disease.  He is s/p cycle #1 carboplatin, etoposide and Tecentriq with Neulasta support (07/06/2020).  He was admitted to [facility] from 07/21/2020 - 07/25/2020 with worsening shortness of breath x a few days. Bilateral lower extremity venous duplex showed bilateral DVT involving the popliteal and calf veins. Chest CT angiogram showed pulmonary emboli within all lobes of the right lung. He underwent mechanical thrombectomy of the right pulmonary arteries. He was started on heparin and then transitioned to Eliquis. He was also started on vancomycin and Zosyn for postobstructive pneumonia and was de-escalated to Augmentin on discharge. He was discharged on 3 liters/min nasal cannula oxygen.  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 He previously alcohol daily.  Symptomatically, he has been "ok".  He was weak this morning. He finished his antibiotics and denies fever and productive cough. He is eating a little bit better but his fluid intake is lacking. He lost 14 lbs over the past week. He has been eating small portions.   Exam is stable.  Plan:   1.   Labs today:  CBC with diff, CMP, Mg. 2.   Extensive stage small cell lung cancer             PET scan on 06/12/2020 revealed pleural based disease, hypermetabolic adenopathy, and skeletal metastasis.  Head MRI  revealed no evidence of metastatic disase.  He is s/p cycle #1 carboplatin, etoposide and Tecentriq with Neulasta support (07/06/2020).   He tolerated his chemotherapy well with manageable nausea.  Chest CT angiogram on 07/22/2020 revealed worsening mediastinal and upper abdominal adenopathy.   Images were compared to the PET scan of 06/05/2020 (approximately 1  month prior to initiation of treatment).  Review plan for cycle #2 chemotherapy with follow-up imaging prior to cycle #3.   Patient and his wife are in agreement.  Patient consents to initiation of cycle #2 today.  Labs reviewed.  Cycle #2 carboplatin, etoposide and Tecentriq.  Discuss plan for close monitoring of counts during upcoming holiday given patient's anticoagulation. 3.   Recurrent pleural effusion  Pleurx catheter remains in place.  Pleural fluid output has decreased.  Pleurx catheter currently to remain in place secondary to inability to hold anticoagulation. 4.   Normocytic anemia             Hematocrit 27.4.  Hemoglobin 9.0.  MCV 87.8 today.   Ferritin 359 with an iron saturation of 14% (low) and TIBC 181 on 06/27/2020.   Check iron stores with next lab draw.  Patient has chemotherapy-induced anemia.  Preauth Retacrit.        5.   Cancer-related pain             Pain appears fairly well controlled.  Continue oxycodone 10 mg po q 4 hours prn pain.  Continue to monitor and adjust pain medications as needed. 6.   Poor appetite  Appetite remains modest.  Avoid Megace secondary to recent thrombosis.  Consider Marinol. 7.   Hyponatremia, improved  Sodium 137 today.  Encourage fluids with electrolytes.  Continue to monitor. 8.   Hypomagnesemia  Magnesium 1.6.  Magnesium 2 gm IV. 9.   Pulmonary embolism and bilateral lower extremity DVTs  Continue Eliquis.  Maintain platelets > 50,000. 10.   Begin day 1 of cycle #2 carboplatin, etoposide and Tecentriq. 11.   RTC Tues and Wed for etoposide. 12.   RTC on Thursday for MD assessment, labs (CBC, hold tube), Neulasta, and +/- Retacrit.  I discussed the assessment and treatment plan with the patient.  The patient was provided an opportunity to ask questions and or Remeron.  Were answered.  The patient agreed with the plan and demonstrated an understanding of the instructions.  The patient was advised to call back or seek an in  person evaluation if the symptoms worsen or if the condition fails to improve as anticipated.  I provided 23 minutes of face-to-face time during this this encounter and > 50% was spent counseling as documented under my assessment and plan.  An additional 8 minutes were spent reviewing his chart (Epic and Care Everywhere) including notes, labs, and imaging studies.    Nolon Stalls, MD, PhD  08/03/2020, 3:01 PM   I, De Burrs, am acting as a scribe for Lequita Asal, MD.  I, Bray Mike Gip, MD, have reviewed the above documentation for accuracy and completeness, and I agree with the above.

## 2020-08-03 ENCOUNTER — Inpatient Hospital Stay: Payer: Medicare Other

## 2020-08-03 ENCOUNTER — Other Ambulatory Visit: Payer: Self-pay

## 2020-08-03 ENCOUNTER — Encounter: Payer: Self-pay | Admitting: Hematology and Oncology

## 2020-08-03 ENCOUNTER — Inpatient Hospital Stay (HOSPITAL_BASED_OUTPATIENT_CLINIC_OR_DEPARTMENT_OTHER): Payer: Medicare Other | Admitting: Hematology and Oncology

## 2020-08-03 VITALS — BP 81/39 | HR 105 | Temp 97.6°F | Resp 18 | Ht 71.0 in | Wt 164.9 lb

## 2020-08-03 VITALS — BP 104/68 | HR 80

## 2020-08-03 DIAGNOSIS — C7951 Secondary malignant neoplasm of bone: Secondary | ICD-10-CM | POA: Diagnosis not present

## 2020-08-03 DIAGNOSIS — C3490 Malignant neoplasm of unspecified part of unspecified bronchus or lung: Secondary | ICD-10-CM

## 2020-08-03 DIAGNOSIS — E43 Unspecified severe protein-calorie malnutrition: Secondary | ICD-10-CM | POA: Diagnosis not present

## 2020-08-03 DIAGNOSIS — I82433 Acute embolism and thrombosis of popliteal vein, bilateral: Secondary | ICD-10-CM

## 2020-08-03 DIAGNOSIS — D649 Anemia, unspecified: Secondary | ICD-10-CM

## 2020-08-03 DIAGNOSIS — Z7189 Other specified counseling: Secondary | ICD-10-CM

## 2020-08-03 DIAGNOSIS — I959 Hypotension, unspecified: Secondary | ICD-10-CM

## 2020-08-03 DIAGNOSIS — Z5112 Encounter for antineoplastic immunotherapy: Secondary | ICD-10-CM

## 2020-08-03 DIAGNOSIS — I2699 Other pulmonary embolism without acute cor pulmonale: Secondary | ICD-10-CM

## 2020-08-03 DIAGNOSIS — Z5111 Encounter for antineoplastic chemotherapy: Secondary | ICD-10-CM

## 2020-08-03 LAB — CBC WITH DIFFERENTIAL/PLATELET
Abs Immature Granulocytes: 0.38 10*3/uL — ABNORMAL HIGH (ref 0.00–0.07)
Basophils Absolute: 0.1 10*3/uL (ref 0.0–0.1)
Basophils Relative: 0 %
Eosinophils Absolute: 0.1 10*3/uL (ref 0.0–0.5)
Eosinophils Relative: 0 %
HCT: 27.4 % — ABNORMAL LOW (ref 39.0–52.0)
Hemoglobin: 9 g/dL — ABNORMAL LOW (ref 13.0–17.0)
Immature Granulocytes: 3 %
Lymphocytes Relative: 11 %
Lymphs Abs: 1.5 10*3/uL (ref 0.7–4.0)
MCH: 28.8 pg (ref 26.0–34.0)
MCHC: 32.8 g/dL (ref 30.0–36.0)
MCV: 87.8 fL (ref 80.0–100.0)
Monocytes Absolute: 1.3 10*3/uL — ABNORMAL HIGH (ref 0.1–1.0)
Monocytes Relative: 9 %
Neutro Abs: 11.4 10*3/uL — ABNORMAL HIGH (ref 1.7–7.7)
Neutrophils Relative %: 77 %
Platelets: 219 10*3/uL (ref 150–400)
RBC: 3.12 MIL/uL — ABNORMAL LOW (ref 4.22–5.81)
RDW: 16.1 % — ABNORMAL HIGH (ref 11.5–15.5)
WBC: 14.7 10*3/uL — ABNORMAL HIGH (ref 4.0–10.5)
nRBC: 0 % (ref 0.0–0.2)

## 2020-08-03 LAB — COMPREHENSIVE METABOLIC PANEL
ALT: 20 U/L (ref 0–44)
AST: 29 U/L (ref 15–41)
Albumin: 3.4 g/dL — ABNORMAL LOW (ref 3.5–5.0)
Alkaline Phosphatase: 80 U/L (ref 38–126)
Anion gap: 8 (ref 5–15)
BUN: 13 mg/dL (ref 8–23)
CO2: 25 mmol/L (ref 22–32)
Calcium: 9.3 mg/dL (ref 8.9–10.3)
Chloride: 104 mmol/L (ref 98–111)
Creatinine, Ser: 0.79 mg/dL (ref 0.61–1.24)
GFR, Estimated: >60 mL/min (ref >60)
Glucose, Bld: 110 mg/dL — ABNORMAL HIGH (ref 70–99)
Potassium: 3.9 mmol/L (ref 3.5–5.1)
Sodium: 137 mmol/L (ref 135–145)
Total Bilirubin: 0.4 mg/dL (ref 0.3–1.2)
Total Protein: 7.4 g/dL (ref 6.5–8.1)

## 2020-08-03 LAB — MAGNESIUM: Magnesium: 1.6 mg/dL — ABNORMAL LOW (ref 1.7–2.4)

## 2020-08-03 MED ORDER — PALONOSETRON HCL INJECTION 0.25 MG/5ML
0.2500 mg | Freq: Once | INTRAVENOUS | Status: AC
  Administered 2020-08-03: 0.25 mg via INTRAVENOUS
  Filled 2020-08-03: qty 5

## 2020-08-03 MED ORDER — SODIUM CHLORIDE 0.9 % IV SOLN
65.0000 mg/m2 | Freq: Once | INTRAVENOUS | Status: AC
  Administered 2020-08-03: 130 mg via INTRAVENOUS
  Filled 2020-08-03: qty 6.5

## 2020-08-03 MED ORDER — SODIUM CHLORIDE 0.9 % IV SOLN
480.0000 mg | Freq: Once | INTRAVENOUS | Status: AC
  Administered 2020-08-03: 480 mg via INTRAVENOUS
  Filled 2020-08-03: qty 48

## 2020-08-03 MED ORDER — SODIUM CHLORIDE 0.9% FLUSH
10.0000 mL | INTRAVENOUS | Status: DC | PRN
  Administered 2020-08-03: 10 mL
  Filled 2020-08-03: qty 10

## 2020-08-03 MED ORDER — HEPARIN SOD (PORK) LOCK FLUSH 100 UNIT/ML IV SOLN
500.0000 [IU] | Freq: Once | INTRAVENOUS | Status: AC | PRN
  Administered 2020-08-03: 500 [IU]
  Filled 2020-08-03: qty 5

## 2020-08-03 MED ORDER — SODIUM CHLORIDE 0.9 % IV SOLN
150.0000 mg | Freq: Once | INTRAVENOUS | Status: AC
  Administered 2020-08-03: 150 mg via INTRAVENOUS
  Filled 2020-08-03: qty 5

## 2020-08-03 MED ORDER — MAGNESIUM SULFATE 2 GM/50ML IV SOLN
2.0000 g | Freq: Once | INTRAVENOUS | Status: AC
  Administered 2020-08-03: 2 g via INTRAVENOUS

## 2020-08-03 MED ORDER — SODIUM CHLORIDE 0.9 % IV SOLN
10.0000 mg | Freq: Once | INTRAVENOUS | Status: AC
  Administered 2020-08-03: 10 mg via INTRAVENOUS
  Filled 2020-08-03: qty 1

## 2020-08-03 MED ORDER — SODIUM CHLORIDE 0.9 % IV SOLN
1200.0000 mg | Freq: Once | INTRAVENOUS | Status: AC
  Administered 2020-08-03: 1200 mg via INTRAVENOUS
  Filled 2020-08-03: qty 20

## 2020-08-03 MED ORDER — SODIUM CHLORIDE 0.9 % IV SOLN
Freq: Once | INTRAVENOUS | Status: AC
  Filled 2020-08-03: qty 250

## 2020-08-03 MED ORDER — MAGNESIUM SULFATE 2 GM/50ML IV SOLN
INTRAVENOUS | Status: AC
  Filled 2020-08-03: qty 50

## 2020-08-03 NOTE — Progress Notes (Signed)
The patient c/o left chest pain due to tube (6) today

## 2020-08-03 NOTE — Progress Notes (Signed)
Patient received prescribed treatment in clinic today; tolerated well; discharged to home in stable condition via wheelchair with wife.

## 2020-08-04 ENCOUNTER — Inpatient Hospital Stay: Payer: Medicare Other

## 2020-08-04 VITALS — BP 104/62 | HR 88 | Temp 96.0°F | Resp 18

## 2020-08-04 DIAGNOSIS — Z5112 Encounter for antineoplastic immunotherapy: Secondary | ICD-10-CM | POA: Diagnosis not present

## 2020-08-04 DIAGNOSIS — C3490 Malignant neoplasm of unspecified part of unspecified bronchus or lung: Secondary | ICD-10-CM

## 2020-08-04 MED ORDER — SODIUM CHLORIDE 0.9 % IV SOLN
10.0000 mg | Freq: Once | INTRAVENOUS | Status: AC
  Administered 2020-08-04: 10 mg via INTRAVENOUS
  Filled 2020-08-04: qty 1

## 2020-08-04 MED ORDER — SODIUM CHLORIDE 0.9% FLUSH
10.0000 mL | INTRAVENOUS | Status: DC | PRN
  Administered 2020-08-04: 10 mL
  Filled 2020-08-04: qty 10

## 2020-08-04 MED ORDER — HEPARIN SOD (PORK) LOCK FLUSH 100 UNIT/ML IV SOLN
500.0000 [IU] | Freq: Once | INTRAVENOUS | Status: AC | PRN
  Administered 2020-08-04: 500 [IU]
  Filled 2020-08-04: qty 5

## 2020-08-04 MED ORDER — SODIUM CHLORIDE 0.9 % IV SOLN
65.0000 mg/m2 | Freq: Once | INTRAVENOUS | Status: AC
  Administered 2020-08-04: 130 mg via INTRAVENOUS
  Filled 2020-08-04: qty 6.5

## 2020-08-04 MED ORDER — SODIUM CHLORIDE 0.9 % IV SOLN
Freq: Once | INTRAVENOUS | Status: AC
  Filled 2020-08-04: qty 250

## 2020-08-04 NOTE — Progress Notes (Signed)
Patient received prescribed treatment in the clinic today; tolerated well. Patient discharged to home in stable condition.

## 2020-08-05 ENCOUNTER — Inpatient Hospital Stay: Payer: Medicare Other

## 2020-08-05 ENCOUNTER — Telehealth: Payer: Self-pay

## 2020-08-05 ENCOUNTER — Telehealth: Payer: Self-pay | Admitting: Cardiothoracic Surgery

## 2020-08-05 ENCOUNTER — Other Ambulatory Visit: Payer: Self-pay

## 2020-08-05 VITALS — BP 96/59 | HR 84 | Resp 18

## 2020-08-05 DIAGNOSIS — C3490 Malignant neoplasm of unspecified part of unspecified bronchus or lung: Secondary | ICD-10-CM

## 2020-08-05 DIAGNOSIS — Z5112 Encounter for antineoplastic immunotherapy: Secondary | ICD-10-CM | POA: Diagnosis not present

## 2020-08-05 DIAGNOSIS — J90 Pleural effusion, not elsewhere classified: Secondary | ICD-10-CM

## 2020-08-05 MED ORDER — SODIUM CHLORIDE 0.9 % IV SOLN
10.0000 mg | Freq: Once | INTRAVENOUS | Status: AC
  Administered 2020-08-05: 10 mg via INTRAVENOUS
  Filled 2020-08-05: qty 10

## 2020-08-05 MED ORDER — SODIUM CHLORIDE 0.9 % IV SOLN
130.0000 mg | Freq: Once | INTRAVENOUS | Status: AC
  Administered 2020-08-05: 130 mg via INTRAVENOUS
  Filled 2020-08-05: qty 6.5

## 2020-08-05 MED ORDER — SODIUM CHLORIDE 0.9 % IV SOLN: 65.0000 mg/m2 | Freq: Once | INTRAVENOUS | Status: DC

## 2020-08-05 MED ORDER — SODIUM CHLORIDE 0.9 % IV SOLN
INTRAVENOUS | Status: DC
  Filled 2020-08-05: qty 250

## 2020-08-05 MED ORDER — HEPARIN SOD (PORK) LOCK FLUSH 100 UNIT/ML IV SOLN
500.0000 [IU] | Freq: Once | INTRAVENOUS | Status: AC
  Administered 2020-08-05: 500 [IU] via INTRAVENOUS
  Filled 2020-08-05: qty 5

## 2020-08-05 NOTE — Progress Notes (Signed)
[facility]  [address], [address] Dwayne Thompson, [place] [address] Phone: [phone]  Fax: [phone]   Office Visit:  08/06/20  Referring physician: Jerrol Banana.,*  Chief Complaint: Dwayne Thompson is a 74 y.o. male with extensive stage small cell lung cancer who is seen for assessment on day 4 s/p cycle #2 carboplatin, etoposide, and Tecentriq.  HPI:  The patient was last seen in the medical oncology clinic on 08/03/2020. At that time, he felt "ok".  He had completed antibiotics.  He denied any fever.  PleurX catheter was draining little.  Blood pressure was supported by midodrine.  Hematocrit was 27.4, hemoglobin 9.0, MCV 87.8, platelets 219,000, WBC 14,700 (ANC 11,400). Albumin was 3.4. Magnesium was 1.6.  He began cycle #2 carboplatin, etoposide and Tecentriq.  During the interim, he has been "not the best."  He is still having left-sided chest wall pain due to his Pleurx catheter. He has been nauseous but has not vomited. He has a lot of hiccups. He is no longer taking Megace. He takes 4 Midodrine daily.  The patient has not been staying hydrated. He does not want to go to [facility] for fluids tomorrow. He agrees to IV fluids in [place] next week.   He states that he "does not want to give up."  His wife states that she feels that her husband resents her. She is doing everything she can for him and encouraging him to follow all of the instructions from his doctors. She would like to speak to somebody about this.   Past Medical History:  Diagnosis Date  . Anemia   . Back pain   . BPH (benign prostatic hyperplasia)   . Cirrhosis ([place])   . COPD (chronic obstructive pulmonary disease) ([place])   . ED (erectile dysfunction)   . GERD (gastroesophageal reflux disease)   . Hyperlipidemia   . Hypertension    Past Surgical History:  Procedure Laterality Date  . COLONOSCOPY Left 01/03/2018   Procedure: COLONOSCOPY;  Surgeon: Virgel Manifold, MD;   Location: [facility] ENDOSCOPY;  Service: Endoscopy;  Laterality: Left;  . COLONOSCOPY WITH PROPOFOL N/A 04/05/2018   Procedure: COLONOSCOPY WITH PROPOFOL;  Surgeon: Virgel Manifold, MD;  Location: [facility] ENDOSCOPY;  Service: Endoscopy;  Laterality: N/A;  . CYSTOSCOPY WITH STENT PLACEMENT Right 09/26/2019   Procedure: CYSTOSCOPY WITH STENT PLACEMENT;  Surgeon: Royston Cowper, MD;  Location: [facility] ORS;  Service: Urology;  Laterality: Right;  . ENTEROSCOPY  01/03/2018   Procedure: ENTEROSCOPY;  Surgeon: Virgel Manifold, MD;  Location: [facility] ENDOSCOPY;  Service: Endoscopy;;  . ESOPHAGOGASTRODUODENOSCOPY N/A 01/02/2018   Procedure: ESOPHAGOGASTRODUODENOSCOPY (EGD);  Surgeon: Virgel Manifold, MD;  Location: [facility] ENDOSCOPY;  Service: Endoscopy;  Laterality: N/A;  . ESOPHAGOGASTRODUODENOSCOPY (EGD) WITH PROPOFOL N/A 04/05/2018   Procedure: ESOPHAGOGASTRODUODENOSCOPY (EGD) WITH PROPOFOL;  Surgeon: Virgel Manifold, MD;  Location: [facility] ENDOSCOPY;  Service: Endoscopy;  Laterality: N/A;  . exploration and neurolysis of right peroneal nerve at the knee Right 08/27/2018  . GREEN LIGHT LASER TURP (TRANSURETHRAL RESECTION OF PROSTATE N/A 09/26/2019   Procedure: GREEN LIGHT LASER TURP (TRANSURETHRAL RESECTION OF PROSTATE;  Surgeon: Royston Cowper, MD;  Location: [facility] ORS;  Service: Urology;  Laterality: N/A;  . HERNIA REPAIR     1980's  . PORTACATH PLACEMENT Left 06/29/2020   Procedure: INSERTION PORT-A-CATH;  Surgeon: [name], MD;  Location: [facility] ORS;  Service: Thoracic;  Laterality: Left;  . PULMONARY THROMBECTOMY N/A 07/22/2020   Procedure: PULMONARY THROMBECTOMY;  Surgeon: Algernon Huxley, MD;  Location: [place] CV LAB;  Service: Cardiovascular;  Laterality: N/A;  . VIDEO ASSISTED THORACOSCOPY (VATS)/THOROCOTOMY Left 06/29/2020   Procedure: VIDEO ASSISTED THORACOSCOPY;  Surgeon: [name], MD;  Location: [facility] ORS;  Service: Thoracic;  Laterality: Left;   Family History  Problem Relation  Age of Onset  . Alzheimer's disease Mother   . Sarcoidosis Sister   . Anxiety disorder Brother   . Kidney disease Neg Hx   . Prostate cancer Neg Hx     Social History: reports that he quit smoking about 6 years ago. His smoking use included cigarettes. He has a 50.00 pack-year smoking history. He has never used smokeless tobacco. He reports current alcohol use of about 21.0 - 28.0 standard drinks of alcohol per week. He reports that he does not use drugs. He drinks 2-3 alcohol shots/day. His wife's name isJanice and granddaughter's name isAriel. The patient is accompanied by his wife today.  Allergies:  Allergies  Allergen Reactions  . Indomethacin Swelling    ankle swelling  . Tamsulosin Hcl Hives  . Sulfa Antibiotics Rash   Current Outpatient Medications  Medication Instructions  . acetaminophen (TYLENOL) 650 mg, Oral, 3 times daily  . apixaban (ELIQUIS) 5 MG TABS tablet Take 10mg  (2 tabs) twice daily for 7 days. Then on 8th day, take 5mg  (1 tab) twice daily.  . cholecalciferol (VITAMIN D3) 1,000 Units, Oral, Daily  . cyanocobalamin 1,000 mcg, Oral, Daily  . dutasteride (AVODART) 0.5 mg, Oral, Daily  . feeding supplement (ENSURE ENLIVE / ENSURE PLUS) LIQD 237 mLs, Oral, 2 times daily between meals  . folic acid (FOLVITE) 1 mg, Oral, Daily  . lactulose (CHRONULAC) 30 g, Oral, 2 times daily  . lidocaine (LIDODERM) 5 % 1 patch, Transdermal, Every 24 hours, Remove & Discard patch within 12 hours or as directed by MD  . Lidocaine (SALONPAS PAIN RELIEVING) 4 % PTCH 1 patch, Apply externally,  Every morning - 10a  . loratadine (CLARITIN) 10 mg, Oral, Daily  . megestrol (MEGACE) 200 mg, Oral, Daily  . midodrine (PROAMATINE) 10 mg, Oral, 4 times daily  . montelukast (SINGULAIR) 10 MG tablet TAKE 1 TABLET BY MOUTH EVERY DAY  . ondansetron (ZOFRAN) 8 mg, Oral, 2 times daily PRN, Start on day 3 after carboplatin chemo.  Marland Kitchen oxyCODONE (ROXICODONE) 15 mg, Oral, Every 4 hours PRN  .  pantoprazole (PROTONIX) 20 MG tablet TAKE ONE TABLET BY MOUTH TWICE A DAY  . polyethylene glycol (MIRALAX / GLYCOLAX) 17 g, Oral, Daily PRN  . potassium chloride (K-DUR,KLOR-CON) 10 MEQ tablet 10 mEq, Oral, 2 times daily  . prochlorperazine (COMPAZINE) 10 mg, Oral, Every 6 hours PRN  . rosuvastatin (CRESTOR) 10 mg, Oral, Daily    Review of Systems  Constitutional: Negative for chills, diaphoresis, fever, malaise/fatigue and weight loss (up 2 lbs).  HENT: Negative for congestion, ear discharge, ear pain, hearing loss, nosebleeds, sinus pain, sore throat and tinnitus.   Eyes: Negative for blurred vision.  Respiratory: Positive for shortness of breath (on exertion). Negative for cough, hemoptysis and sputum production.        Left-sided chest wall pain. On 3 liters oxygen.  Cardiovascular: Negative for chest pain, palpitations and leg swelling.  Gastrointestinal: Positive for nausea (worsening). Negative for abdominal pain, blood in stool, constipation (takes miralax), diarrhea, heartburn, melena and vomiting.       Appetite improved. Eating small portions. Fluid intake is lacking.  Genitourinary: Negative for dysuria, frequency, hematuria and urgency.  Musculoskeletal: Negative for back pain, joint pain, myalgias and neck pain.  Skin: Negative for itching and rash.  Neurological: Positive for weakness. Negative for dizziness, tingling, sensory change and headaches.  Endo/Heme/Allergies: Does not bruise/bleed easily.  Psychiatric/Behavioral: Negative for depression and memory loss. The patient is not nervous/anxious and does not have insomnia.   All other systems reviewed and are negative.   Performance status: 2-3   Vital Signs Blood pressure 102/60, pulse 86, temperature 97.6 F (36.4 C), temperature source Tympanic, resp. rate 20, height 5\' 11"  (1.803 m), weight 166 lb 12.5 oz (75.7 kg), SpO2 100 %.  Physical Exam Vitals and nursing note reviewed.  Constitutional:      General: He is  not in acute distress.    Appearance: He is not diaphoretic.  HENT:     Head: Normocephalic and atraumatic.     Comments:  Short graying hair    Mouth/Throat:     Mouth: Mucous membranes are moist.     Pharynx: Oropharynx is clear.  Eyes:     General: No scleral icterus.    Extraocular Movements: Extraocular movements intact.     Conjunctiva/sclera: Conjunctivae normal.     Pupils: Pupils are equal, round, and reactive to light.     Comments: Glasses. Brown eyes.   Cardiovascular:     Rate and Rhythm: Normal rate and regular rhythm.     Heart sounds: Normal heart sounds. No murmur heard.   Pulmonary:     Effort: Pulmonary effort is normal. No respiratory distress.     Breath sounds: Normal breath sounds. No wheezing or rales.  Chest:     Chest wall: No tenderness.  Abdominal:     General: Bowel sounds are normal. There is no distension.     Palpations: Abdomen is soft. There is no mass.     Tenderness: There is no abdominal tenderness. There is no guarding or rebound.  Musculoskeletal:        General: No swelling or tenderness. Normal range of motion.     Cervical back: Normal range of motion and neck supple.  Lymphadenopathy:     Head:     Right side of head: No preauricular, posterior auricular or occipital adenopathy.     Left side of head: No preauricular, posterior auricular or occipital adenopathy.     Cervical: No cervical adenopathy.     Upper Body:     Right upper body: No supraclavicular or axillary adenopathy.     Left upper body: No supraclavicular or axillary adenopathy.     Lower Body: No right inguinal adenopathy. No left inguinal adenopathy.  Skin:    General: Skin is warm and dry.  Neurological:     Mental Status: He is alert and oriented to person, place, and time.  Psychiatric:        Behavior: Behavior normal.        Thought Content: Thought content normal.        Judgment: Judgment normal.     Results for orders placed or performed in visit on  08/06/20 (from the past 48 hour(s))  Magnesium     Status: None   Collection Time: 08/06/20  2:44 PM  Result Value Ref Range   Magnesium 1.9 1.7 - 2.4 mg/dL    Comment: Performed at [facility], [address], [place], [place] [address]  CBC     Status: Abnormal   Collection Time: 08/06/20  2:44 PM  Result Value Ref Range  WBC 14.0 (H) 4.0 - 10.5 K/uL   RBC 3.15 (L) 4.22 - 5.81 MIL/uL   Hemoglobin 9.0 (L) 13.0 - 17.0 g/dL   HCT 27.6 (L) 39 - 52 %   MCV 87.6 80.0 - 100.0 fL   MCH 28.6 26.0 - 34.0 pg   MCHC 32.6 30.0 - 36.0 g/dL   RDW 15.9 (H) 11.5 - 15.5 %   Platelets 185 150 - 400 K/uL   nRBC 0.0 0.0 - 0.2 %    Comment: Performed at [facility], [address] [place], [place] [address]   No results found.  Assessment:  DIONISIOS RICCI is a 74 y.o. male with extensive stage small cell lung cancer s/p left chest wall/pleural biopsy on 06/24/2020.  He presented with left hemothorax. Thoracentesis x2 (05/20/2020 and 06/05/2020) revealed bloody fluid with no evidence of malignancy.  He has a chest tube in place.  Chest CT on 06/05/2020 revealed pleural thickening, nodularity and pleural-based mass with central adenopathy versus mass suspicious for pulmonary neoplasm with extensive pleural metastasis.  Mesothelioma is in the differential.  There was increasing left subcarinal lymph node use into the left mainstem bronchus.  There was increasing upper abdominal lymph nodes.  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  There was accentuated activity along the consolidative and atelectatic left lower lobe probably from tumor within the lung but potentially from tumor along the visceral pleura.  There was hypermetabolic thoracic adenopathy along with hypermetabolic upper abdominal adenopathy and hypermetabolic skeletal metastatic lesions (right iliac bone SUV 11.9 and T1).  There was possible early tumor extension into the left T11-12 neuroforamen with some erosion of the adjacent T11 vertebral body.  Thoracic spine MRI with and without contrast on 06/28/2020 revealed a left paraspinous soft tissue mass extending from T9-10 through T12-L1, with invasion/involvement of the adjacent T10 and T11 vertebral bodies. Tumor partially extended into the left neural foramen at T11-12. There was no other significant extension into the spinal canal or epidural tumor. There were additional metastatic lesions involving the T1 and T10 vertebral bodies as well as the right pedicle of T6. There was no associated pathologic fracture. There was a heterogeneous and partially loculated left pleural effusion with consolidation signal intensity throughout the visualized left lower lung.  Brain MRI with and without contrast on 06/28/2020 revealed no evidence for intracranial metastatic disease. There was mild to moderate chronic microvascular ischemic disease.  He is day 4 s/p cycle #2 carboplatin, etoposide and Tecentriq with Neulasta support (07/06/2020 - 08/03/2020).  He was [facility] to [facility] from 07/21/2020 - 07/25/2020 with worsening shortness of breath x a few days. Bilateral lower extremity venous duplex showed bilateral DVT involving the popliteal and calf veins. Chest CT angiogram showed pulmonary emboli within all lobes of the right lung. He underwent mechanical thrombectomy of the right pulmonary arteries. He was started on heparin and then transitioned to Eliquis. He was also started on vancomycin and Zosyn for postobstructive pneumonia and was de-escalated to Augmentin on discharge. He was discharged on 3 liters/min nasal cannula oxygen.  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 He previously alcohol daily.  Symptomatically, he has been "not the best."  He has left-sided chest wall pain due to his Pleurx catheter. He describes nausea without  vomiting.  He has a lot of hiccups. He takes 4 Midodrine daily.  Magnesium  is 1.9.  Plan:   1.   Labs today:  CBC, ferritin, iron studies, Mg, hold tube. 2.   Extensive stage small cell lung cancer             PET scan on 06/12/2020 revealed pleural based disease, hypermetabolic adenopathy, and skeletal metastasis.  Head MRI  revealed no evidence of metastatic disase.  Chest CT angiogram on 07/22/2020 revealed worsening mediastinal and upper abdominal adenopathy.   Follow-up imaging after recovery from cycle #2 chemotherapy.  He is day 4 s/p cycle #2 carboplatin, etoposide and Tecentriq (began 08/03/2020).   He appears to have tolerated his chemotherapy well.   He would like the Pleurx catheter removed when possible secondary to discomfort.             Review plans for follow-up CBCs next week around Thanksgiving holiday (anticipated nadir) in case transfusion needed.  Discuss symptom management.  He has antiemetics and pain medications at home to use on a prn bases.  Interventions are adequate. 3.   Recurrent pleural effusion  Pleurx catheter remains in place.  Output is minimal.  Anticipate Pleurx catheter removal when safe to come off anticoagulation. 4.   Normocytic anemia             Hematocrit 27.6.  Hemoglobin 9.0.  MCV 87.6 today.   Ferritin 359 with an iron saturation of 14% (low)  and TIBC 181 on 06/27/2020.   Ferritin 632 with an iron saturation of 75% (high) and TIBC 279 on 08/06/2020.   Patient has chemotherapy-induced anemia.  Retacrit today.         5.   Cancer-related pain             Pain appears well controlled.  Continue oxycodone 10 mg po q 4 hours prn pain.  Continue to monitor. 6.   Poor appetite  Encourage caloric intake. Patient no longer on Megace.  Consider Marinol.  Discuss plan for hydration (IVF) next week. 7.   Pulmonary embolism and bilateral lower extremity DVT  Continue Eliquis.  Maintain platelets > 50,000. 8.   Debilitation  Discuss palliative  care follow-up with [name], NP. 9.   Neulasta today. 10.   RTC on 11/19 or 11/22 with Josh Borders (patient known to him from hospital). 11.   RTC on 08/11/2020 for MD assessment prior to Thanksgiving holiday. 12.   RTC Monday-Wednesday next week for IVF.  I discussed the assessment and treatment plan with the patient.  The patient was provided an opportunity to ask questions and all were answered.  The patient agreed with the plan and demonstrated an understanding of the instructions.  The patient was advised to call back or seek an in person evaluation if the symptoms worsen or if the condition fails to improve as anticipated.  I provided 22 minutes of face-to-face time during this this encounter and > 50% was spent counseling as documented under my assessment and plan.   Nolon Stalls, MD, PhD  08/06/2020, 3:49 PM   I, De Burrs, am acting as a scribe for Lequita Asal, MD.  I, [place] Mike Gip, MD, have reviewed the above documentation for accuracy and completeness, and I agree with the above.

## 2020-08-05 NOTE — Telephone Encounter (Deleted)
Medical Clearance was faxed to [place] office today per Dr.Oaks for the patient to stop Eliquis 5 days prior to having pleurax catheter removed in office.

## 2020-08-05 NOTE — Telephone Encounter (Signed)
Medical Clearance was faxed to [place] office today per Dr.Oaks for the patient to stop Eliquis 5 days prior to having pleurax catheter removed in office.

## 2020-08-05 NOTE — Telephone Encounter (Signed)
Wife, [name] calls and says that her husband is having trouble with his pleurex catheter.  She couldn't explain to me what is going on.  She is quite concerned.  Please call her.  Thank you.

## 2020-08-05 NOTE — Progress Notes (Signed)
Pt received prescribed treatment in clinic, pt stable at d/c. 

## 2020-08-05 NOTE — Telephone Encounter (Signed)
Per Dr Genevive Bi would like for a medical clearance to be sent to Dr Kem Parkinson office due to he would like for patient to stop prescription for Eliquis 5 days prior to in office procedure. Medical clearance faxed and secured chat was sent to Dr Mike Gip on behalf of patient. Patient wife was made aware of Dr Genevive Bi recommendations and today would be the last day to take Eliquis, but would like to hear back from Dr Kem Parkinson office before proceeding with decision. Patient wife was notified she would hear back from our office before the end of the business day. [name] verbalized understanding and has no further questions

## 2020-08-06 ENCOUNTER — Ambulatory Visit: Payer: Self-pay | Admitting: Family Medicine

## 2020-08-06 ENCOUNTER — Encounter: Payer: Self-pay | Admitting: Family Medicine

## 2020-08-06 ENCOUNTER — Inpatient Hospital Stay: Payer: Medicare Other | Attending: Hematology and Oncology

## 2020-08-06 ENCOUNTER — Inpatient Hospital Stay (HOSPITAL_BASED_OUTPATIENT_CLINIC_OR_DEPARTMENT_OTHER): Payer: Medicare Other | Admitting: Hematology and Oncology

## 2020-08-06 ENCOUNTER — Encounter: Payer: Self-pay | Admitting: Hematology and Oncology

## 2020-08-06 ENCOUNTER — Telehealth (INDEPENDENT_AMBULATORY_CARE_PROVIDER_SITE_OTHER): Payer: Medicare Other | Admitting: Family Medicine

## 2020-08-06 ENCOUNTER — Inpatient Hospital Stay: Payer: Medicare Other

## 2020-08-06 VITALS — BP 102/60 | HR 86 | Temp 97.6°F | Resp 20 | Ht 71.0 in | Wt 166.8 lb

## 2020-08-06 DIAGNOSIS — D649 Anemia, unspecified: Secondary | ICD-10-CM

## 2020-08-06 DIAGNOSIS — G893 Neoplasm related pain (acute) (chronic): Secondary | ICD-10-CM

## 2020-08-06 DIAGNOSIS — C3490 Malignant neoplasm of unspecified part of unspecified bronchus or lung: Secondary | ICD-10-CM

## 2020-08-06 DIAGNOSIS — J90 Pleural effusion, not elsewhere classified: Secondary | ICD-10-CM

## 2020-08-06 DIAGNOSIS — J942 Hemothorax: Secondary | ICD-10-CM | POA: Diagnosis not present

## 2020-08-06 DIAGNOSIS — C7951 Secondary malignant neoplasm of bone: Secondary | ICD-10-CM | POA: Diagnosis not present

## 2020-08-06 DIAGNOSIS — Z5112 Encounter for antineoplastic immunotherapy: Secondary | ICD-10-CM | POA: Diagnosis not present

## 2020-08-06 DIAGNOSIS — Z7189 Other specified counseling: Secondary | ICD-10-CM

## 2020-08-06 DIAGNOSIS — I959 Hypotension, unspecified: Secondary | ICD-10-CM

## 2020-08-06 DIAGNOSIS — J9621 Acute and chronic respiratory failure with hypoxia: Secondary | ICD-10-CM | POA: Diagnosis not present

## 2020-08-06 LAB — CBC
HCT: 27.6 % — ABNORMAL LOW (ref 39.0–52.0)
Hemoglobin: 9 g/dL — ABNORMAL LOW (ref 13.0–17.0)
MCH: 28.6 pg (ref 26.0–34.0)
MCHC: 32.6 g/dL (ref 30.0–36.0)
MCV: 87.6 fL (ref 80.0–100.0)
Platelets: 185 10*3/uL (ref 150–400)
RBC: 3.15 MIL/uL — ABNORMAL LOW (ref 4.22–5.81)
RDW: 15.9 % — ABNORMAL HIGH (ref 11.5–15.5)
WBC: 14 10*3/uL — ABNORMAL HIGH (ref 4.0–10.5)
nRBC: 0 % (ref 0.0–0.2)

## 2020-08-06 LAB — SAMPLE TO BLOOD BANK

## 2020-08-06 LAB — IRON AND TIBC
Iron: 209 ug/dL — ABNORMAL HIGH (ref 45–182)
Saturation Ratios: 75 % — ABNORMAL HIGH (ref 17.9–39.5)
TIBC: 279 ug/dL (ref 250–450)
UIBC: 70 ug/dL

## 2020-08-06 LAB — FERRITIN: Ferritin: 632 ng/mL — ABNORMAL HIGH (ref 24–336)

## 2020-08-06 LAB — MAGNESIUM: Magnesium: 1.9 mg/dL (ref 1.7–2.4)

## 2020-08-06 MED ORDER — MIDODRINE HCL 10 MG PO TABS
10.0000 mg | ORAL_TABLET | Freq: Four times a day (QID) | ORAL | 0 refills | Status: DC
Start: 2020-08-06 — End: 2020-09-03

## 2020-08-06 MED ORDER — EPOETIN ALFA-EPBX 10000 UNIT/ML IJ SOLN
10000.0000 [IU] | Freq: Once | INTRAMUSCULAR | Status: AC
  Administered 2020-08-06: 10000 [IU] via SUBCUTANEOUS

## 2020-08-06 NOTE — Progress Notes (Signed)
  Virtual Visit via Video Note  I connected with Dwayne Thompson on 08/06/20 at  9:40 AM EST by a video enabled telemedicine application and verified that I am speaking with the correct person using two identifiers.  Location: Patient: Home Provider: Office   I discussed the limitations of evaluation and management by telemedicine and the availability of in person appointments. The patient expressed understanding and agreed to proceed.  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 The therapy wipes and out him out and he is on home O2.  He is also now on midodrine for hypotension. He states his pain is fairly well controlled and when it flares it is a 7/10.  He states the pain pill available to him which is OxyContin helps him get relief.  He is comfortable and breathing okay.    Observations/Objective: Patient in no acute distress.  He is wearing oxygen via nasal cannula.  He looks thinner and weaker than on his last visit.  He is able to speak in full sentences and is not having any respiratory distress.  Assessment and Plan: 1. Small cell lung cancer ([place]) Followed by oncology. I think his prognosis is not optimistic. Talk about enjoying the family for Thanksgiving and hopefully Christmas. He is appropriately anxious about his health issues going forward.  2. Hypotension, unspecified hypotension type On midodrine and not symptomatic today.  3. Acute on chronic respiratory failure with hypoxia (HCC) On oxygen 24/7  4. Hemothorax on left Followed by thoracic surgery  5. Cancer-related pain    Follow Up Instructions:    I discussed the assessment and treatment plan with the patient. The patient was provided an opportunity to ask questions and all were answered. The patient agreed with the plan and demonstrated an understanding of the instructions.   The patient was advised to call back or seek  an in-person evaluation if the symptoms worsen or if the condition fails to improve as anticipated.  I provided 12 minutes of non-face-to-face time during this encounter.   [name], MD

## 2020-08-06 NOTE — Progress Notes (Signed)
Patient here today for follow up with wife. States he is having some pain in his left side abdomen and rates pain at 6. He reports he has not been able to eat a lot and does use ensure. He reports some nausea from time to time. Denies questions at this time.

## 2020-08-10 ENCOUNTER — Other Ambulatory Visit: Payer: Self-pay

## 2020-08-10 ENCOUNTER — Inpatient Hospital Stay (HOSPITAL_BASED_OUTPATIENT_CLINIC_OR_DEPARTMENT_OTHER): Payer: Medicare Other | Admitting: Hospice and Palliative Medicine

## 2020-08-10 ENCOUNTER — Other Ambulatory Visit (INDEPENDENT_AMBULATORY_CARE_PROVIDER_SITE_OTHER): Payer: Self-pay | Admitting: Vascular Surgery

## 2020-08-10 ENCOUNTER — Inpatient Hospital Stay: Payer: Medicare Other

## 2020-08-10 ENCOUNTER — Ambulatory Visit: Payer: Self-pay | Admitting: Cardiothoracic Surgery

## 2020-08-10 VITALS — BP 98/59 | HR 85 | Resp 18

## 2020-08-10 DIAGNOSIS — Z515 Encounter for palliative care: Secondary | ICD-10-CM | POA: Diagnosis not present

## 2020-08-10 DIAGNOSIS — Z5112 Encounter for antineoplastic immunotherapy: Secondary | ICD-10-CM | POA: Diagnosis not present

## 2020-08-10 DIAGNOSIS — G893 Neoplasm related pain (acute) (chronic): Secondary | ICD-10-CM

## 2020-08-10 DIAGNOSIS — Z7189 Other specified counseling: Secondary | ICD-10-CM

## 2020-08-10 DIAGNOSIS — I82433 Acute embolism and thrombosis of popliteal vein, bilateral: Secondary | ICD-10-CM

## 2020-08-10 DIAGNOSIS — I959 Hypotension, unspecified: Secondary | ICD-10-CM

## 2020-08-10 DIAGNOSIS — C3490 Malignant neoplasm of unspecified part of unspecified bronchus or lung: Secondary | ICD-10-CM | POA: Diagnosis not present

## 2020-08-10 MED ORDER — SODIUM CHLORIDE 0.9% FLUSH
10.0000 mL | Freq: Once | INTRAVENOUS | Status: AC | PRN
  Administered 2020-08-10: 10 mL
  Filled 2020-08-10: qty 10

## 2020-08-10 MED ORDER — POTASSIUM CHLORIDE CRYS ER 10 MEQ PO TBCR: 10.0000 meq | EXTENDED_RELEASE_TABLET | Freq: Two times a day (BID) | ORAL | 3 refills | Status: AC

## 2020-08-10 MED ORDER — OXYCODONE HCL ER 15 MG PO T12A: 15.0000 mg | EXTENDED_RELEASE_TABLET | Freq: Two times a day (BID) | ORAL | 0 refills | Status: DC

## 2020-08-10 MED ORDER — SODIUM CHLORIDE 0.9 % IV SOLN
Freq: Once | INTRAVENOUS | Status: AC
  Filled 2020-08-10: qty 250

## 2020-08-10 MED ORDER — HEPARIN SOD (PORK) LOCK FLUSH 100 UNIT/ML IV SOLN
500.0000 [IU] | Freq: Once | INTRAVENOUS | Status: AC | PRN
  Administered 2020-08-10: 500 [IU]
  Filled 2020-08-10: qty 5

## 2020-08-10 NOTE — Progress Notes (Signed)
Patient received prescribed treatment in clinic. Tolerated well. Patient stable at discharge. 

## 2020-08-10 NOTE — Progress Notes (Signed)
Virtual Visit via Telephone Note  I connected with Dwayne Thompson on 08/10/20 at  1:00 PM EST by telephone and verified that I am speaking with the correct person using two identifiers.  Location: Patient: home Provider: clinic   I discussed the limitations, risks, security and privacy concerns of performing an evaluation and management service by telephone and the availability of in person appointments. I also discussed with the patient that there may be a patient responsible charge related to this service. The patient expressed understanding and agreed to proceed.   History of Present Illness: Dwayne Thompson is a 74 y.o. 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  He was admitted to the hospital on 07/21/2020 with shortness of breath.  Patient was found to have bilateral lower extremity DVTs and extensive pulmonary emboli in all lobes of the right lung with evidence of right heart strain.  He was also noted to have worsening mediastinal and upper abdominal adenopathy and worsening cardiophrenic/abdominal wall mass on the CTA of the chest.  Patient underwent mechanical thrombectomy on 07/21/2020.  Palliative care is consulted to help address goals.   Observations/Objective: I spoke with patient and wife by phone.  They report that since discharging home from the hospital, patient has had persistent generalized pain.  He currently rates his pain as 7 out of 10.  He is taking oxycodone IR every 4 hours while awake.  He is waking some at night and pain.  He denies any adverse effects from the pain medication.  He is having some constipation with a bowel movement every 3 to 4 days.  He is taking MiraLAX for that but not  consistently.  Patient also says that his oral intake has been poor.  He received IV fluids today in the clinic.  Regarding pain, we discussed options for management in detail.  Given poor control on oxycodone IR, patient is interested in starting a long-acting opioid.  Will start OxyContin 15 mg every 12 with plan to increase to every 8 hour dosing if needed.  Patient can continue taking oxycodone IR as needed for breakthrough pain.  We discussed the risk of worsening constipation and bowel regimen in detail.  Both patient and wife states that their goals are aligned with continued treatment.  Patient does verbalize understanding that his disease is incurable and will likely ultimately result his passing at some point.  Patient was a DNR in the hospital but had reversed his CODE STATUS prior to discharge.  I readdressed CODE STATUS today.  Patient had wanted to speak with his children but said that when he attempted to do so they became quite emotional.  He plans to continue addressing decisions as a family.  Patient's wife requested refill of p.o. potassium  Assessment and Plan: Stage IV small cell lung cancer -on treatment with chemotherapy/immunotherapy.  Followed by Dr. Mike Gip  Dehydration -patient received IV fluids today.  Discussed importance of oral rehydration  Neoplasm related pain -we will start OxyContin 15 mg every 12 hours.  Consider increasing to every 8 hour dosing if needed.  Continue oxycodone IR 15 mg every 4 hours as needed for breakthrough pain.  PDMP reviewed  Opioid-induced constipation -suggested increasing MiraLAX to daily or twice daily.  Also suggested trial of daily senna as needed.  ACP -patient still considering decision making.  Discussed probable futility associated with resuscitative efforts in setting of stage IV cancer.  Will consult home-based PC  Follow Up Instructions: Follow-up virtual visit 2 weeks   I discussed the assessment and treatment plan with  the patient. The patient was provided an opportunity to ask questions and all were answered. The patient agreed with the plan and demonstrated an understanding of the instructions.   The patient was advised to call back or seek an in-person evaluation if the symptoms worsen or if the condition fails to improve as anticipated.  I provided 15 minutes of non-face-to-face time during this encounter.   Irean Hong, NP

## 2020-08-10 NOTE — Progress Notes (Signed)
[facility]  [address] [place], [place] [address] Phone: [phone]  Fax: [phone]   Office Visit:  08/11/20  Referring physician: Jerrol Banana.,*  Chief Complaint: Dwayne Thompson is a 74 y.o. male with extensive stage small cell lung cancer currently day 9 s/p cycle #2 carboplatin, etoposide, and Tecentriq who is seen for 1 week assessment.  HPI:  The patient was last seen in the medical oncology clinic on 08/06/2020. At that time, he noted chronic left sided chest wall pain secondary to the PleurX catheter. Hematocrit was 27.6, hemoglobin 9.0, MCV 87.6, platelets 185,000, WBC 14,000. Ferritin was 632 with an iron saturation of 75% and a TIBC of 279. Fluid intake was poor.  We discussed daily IVF in clinic.  He received Retacrit 10,000 units.  He received IVF on 08/10/2020.  He saw Billey Chang, NP on 08/10/2020 via telephone.  Pain was rated 7 out of 10.  He was taking oxycodone IR every 4 hours while awake.  He had constipation with bowel movements every 3-4 days; he was not consistently taking Miralax.  Oral intake was poor.  He was started on Oxycontin 15 mg po q 12 hours with plan to increase to every 8 hours if needed.  A bowel regimen was discussed.  Code status was discussed; he was to continue discussions with his family.  He has a follow-up in 2 weeks.   During the interim, he has been good. He is feeling better today. He had physical therapy today and walked more than usual. He is eating a little bit better. He ate meatloaf, potatoes, and okra for dinner last night. His fluid intake has been "not the best" but he plans to drink more water today. He would like IV fluids today but not tomorrow.  He still has some pain due to his PleurX catheter. It was last drained on 08/02/2020 and there was only 1 drop of fluid.   Past Medical History:  Diagnosis Date  . Anemia   . Back pain   . BPH (benign prostatic hyperplasia)   .  Cirrhosis ([place])   . COPD (chronic obstructive pulmonary disease) ([place])   . ED (erectile dysfunction)   . GERD (gastroesophageal reflux disease)   . Hyperlipidemia   . Hypertension    Past Surgical History:  Procedure Laterality Date  . COLONOSCOPY Left 01/03/2018   Procedure: COLONOSCOPY;  Surgeon: Virgel Manifold, MD;  Location: [facility] ENDOSCOPY;  Service: Endoscopy;  Laterality: Left;  . COLONOSCOPY WITH PROPOFOL N/A 04/05/2018   Procedure: COLONOSCOPY WITH PROPOFOL;  Surgeon: Virgel Manifold, MD;  Location: [facility] ENDOSCOPY;  Service: Endoscopy;  Laterality: N/A;  . CYSTOSCOPY WITH STENT PLACEMENT Right 09/26/2019   Procedure: CYSTOSCOPY WITH STENT PLACEMENT;  Surgeon: Royston Cowper, MD;  Location: [facility] ORS;  Service: Urology;  Laterality: Right;  . ENTEROSCOPY  01/03/2018   Procedure: ENTEROSCOPY;  Surgeon: Virgel Manifold, MD;  Location: [facility] ENDOSCOPY;  Service: Endoscopy;;  . ESOPHAGOGASTRODUODENOSCOPY N/A 01/02/2018   Procedure: ESOPHAGOGASTRODUODENOSCOPY (EGD);  Surgeon: Virgel Manifold, MD;  Location: [facility] ENDOSCOPY;  Service: Endoscopy;  Laterality: N/A;  . ESOPHAGOGASTRODUODENOSCOPY (EGD) WITH PROPOFOL N/A 04/05/2018   Procedure: ESOPHAGOGASTRODUODENOSCOPY (EGD) WITH PROPOFOL;  Surgeon: Virgel Manifold, MD;  Location: [facility] ENDOSCOPY;  Service: Endoscopy;  Laterality: N/A;  . exploration and neurolysis of right peroneal nerve at the knee Right 08/27/2018  . GREEN LIGHT LASER TURP (TRANSURETHRAL RESECTION OF PROSTATE N/A 09/26/2019   Procedure: GREEN LIGHT  LASER TURP (TRANSURETHRAL RESECTION OF PROSTATE;  Surgeon: Royston Cowper, MD;  Location: [facility] ORS;  Service: Urology;  Laterality: N/A;  . HERNIA REPAIR     1980's  . PORTACATH PLACEMENT Left 06/29/2020   Procedure: INSERTION PORT-A-CATH;  Surgeon: [name], MD;  Location: [facility] ORS;  Service: Thoracic;  Laterality: Left;  . PULMONARY THROMBECTOMY N/A 07/22/2020   Procedure: PULMONARY THROMBECTOMY;   Surgeon: Algernon Huxley, MD;  Location: [facility];  Service: Cardiovascular;  Laterality: N/A;  . VIDEO ASSISTED THORACOSCOPY (VATS)/THOROCOTOMY Left 06/29/2020   Procedure: VIDEO ASSISTED THORACOSCOPY;  Surgeon: [name], MD;  Location: [facility] ORS;  Service: Thoracic;  Laterality: Left;   Family History  Problem Relation Age of Onset  . Alzheimer's disease Mother   . Sarcoidosis Sister   . Anxiety disorder Brother   . Kidney disease Neg Hx   . Prostate cancer Neg Hx     Social History: reports that he quit smoking about 6 years ago. His smoking use included cigarettes. He has a 50.00 pack-year smoking history. He has never used smokeless tobacco. He reports current alcohol use of about 21.0 - 28.0 standard drinks of alcohol per week. He reports that he does not use drugs. He drinks 2-3 alcohol shots/day. His wife's name isJanice and granddaughter's name isAriel. The patient is accompanied by his wife today.  Allergies:  Allergies  Allergen Reactions  . Indomethacin Swelling    ankle swelling  . Tamsulosin Hcl Hives  . Sulfa Antibiotics Rash   Current Outpatient Medications  Medication Instructions  . acetaminophen (TYLENOL) 650 mg, Oral, 3 times daily  . apixaban (ELIQUIS) 5 MG TABS tablet Take 10mg  (2 tabs) twice daily for 7 days. Then on 8th day, take 5mg  (1 tab) twice daily.  . cholecalciferol (VITAMIN D3) 1,000 Units, Oral, Daily  . cyanocobalamin 1,000 mcg, Oral, Daily  . dutasteride (AVODART) 0.5 mg, Oral, Daily  . feeding supplement (ENSURE ENLIVE / ENSURE PLUS) LIQD 237 mLs, Oral, 2 times daily between meals  . folic acid (FOLVITE) 1 mg, Oral, Daily  . lactulose (CHRONULAC) 30 g, Oral, 2 times daily  . lidocaine (LIDODERM) 5 % 1 patch, Transdermal, Every 24 hours, Remove & Discard patch within 12 hours or as directed by MD  . Lidocaine (SALONPAS PAIN RELIEVING) 4 % PTCH 1 patch, Apply externally,  Every morning - 10a  . loratadine (CLARITIN) 10 mg, Oral,  Daily  . megestrol (MEGACE) 200 mg, Oral, Daily  . midodrine (PROAMATINE) 10 mg, Oral, 4 times daily  . montelukast (SINGULAIR) 10 MG tablet TAKE 1 TABLET BY MOUTH EVERY DAY  . ondansetron (ZOFRAN) 8 mg, Oral, 2 times daily PRN, Start on day 3 after carboplatin chemo.  Marland Kitchen oxyCODONE (OXYCONTIN) 15 mg, Oral, Every 12 hours  . oxyCODONE (ROXICODONE) 15 mg, Oral, Every 4 hours PRN  . pantoprazole (PROTONIX) 20 MG tablet TAKE ONE TABLET BY MOUTH TWICE A DAY  . polyethylene glycol (MIRALAX / GLYCOLAX) 17 g, Oral, Daily PRN  . potassium chloride (KLOR-CON) 10 MEQ tablet 10 mEq, Oral, 2 times daily  . prochlorperazine (COMPAZINE) 10 mg, Oral, Every 6 hours PRN  . rosuvastatin (CRESTOR) 10 mg, Oral, Daily    Review of Systems  Constitutional: Positive for weight loss (4 lbs). Negative for chills, diaphoresis, fever and malaise/fatigue.       Feels better today.  HENT: Negative for congestion, ear discharge, ear pain, hearing loss, nosebleeds, sinus pain, sore throat and tinnitus.   Eyes: Negative  for blurred vision.  Respiratory: Positive for shortness of breath (on exertion). Negative for cough, hemoptysis and sputum production.        Left-sided chest wall pain. On 3 liters oxygen.  Cardiovascular: Negative for chest pain, palpitations and leg swelling.  Gastrointestinal: Negative for abdominal pain, blood in stool, constipation (takes miralax), diarrhea, heartburn, melena, nausea and vomiting.       Appetite improved. Eating small portions. Fluid intake is still lacking.  Genitourinary: Negative for dysuria, frequency, hematuria and urgency.  Musculoskeletal: Negative for back pain, joint pain, myalgias and neck pain.  Skin: Negative for itching and rash.  Neurological: Negative for dizziness, tingling, sensory change, weakness and headaches.  Endo/Heme/Allergies: Does not bruise/bleed easily.  Psychiatric/Behavioral: Negative for depression and memory loss. The patient is not nervous/anxious  and does not have insomnia.   All other systems reviewed and are negative.   Performance status: 2   Vital Signs Blood pressure (!) 92/57, pulse 87, temperature (!) 97 F (36.1 C), temperature source Tympanic, resp. rate 18, weight 162 lb (73.5 kg), SpO2 100 %.  Physical Exam Vitals and nursing note reviewed.  Constitutional:      General: He is not in acute distress.    Appearance: He is not diaphoretic.  HENT:     Head: Normocephalic and atraumatic.     Comments:  Short graying hair    Mouth/Throat:     Mouth: Mucous membranes are moist.     Pharynx: Oropharynx is clear.  Eyes:     General: No scleral icterus.    Extraocular Movements: Extraocular movements intact.     Conjunctiva/sclera: Conjunctivae normal.     Pupils: Pupils are equal, round, and reactive to light.     Comments: Glasses. Brown eyes.   Cardiovascular:     Rate and Rhythm: Normal rate and regular rhythm.     Heart sounds: Normal heart sounds. No murmur heard.   Pulmonary:     Effort: Pulmonary effort is normal. No respiratory distress.     Breath sounds: Normal breath sounds. No wheezing or rales.  Chest:     Chest wall: No tenderness.  Abdominal:     General: Bowel sounds are normal. There is no distension.     Palpations: Abdomen is soft. There is no mass.     Tenderness: There is no abdominal tenderness. There is no guarding or rebound.  Musculoskeletal:        General: No swelling or tenderness. Normal range of motion.     Cervical back: Normal range of motion and neck supple.  Lymphadenopathy:     Head:     Right side of head: No preauricular, posterior auricular or occipital adenopathy.     Left side of head: No preauricular, posterior auricular or occipital adenopathy.     Cervical: No cervical adenopathy.     Upper Body:     Right upper body: No supraclavicular or axillary adenopathy.     Left upper body: No supraclavicular or axillary adenopathy.     Lower Body: No right inguinal  adenopathy. No left inguinal adenopathy.  Skin:    General: Skin is warm and dry.  Neurological:     Mental Status: He is alert and oriented to person, place, and time.  Psychiatric:        Behavior: Behavior normal.        Thought Content: Thought content normal.        Judgment: Judgment normal.    No results found  for this or any previous visit (from the past 48 hour(s)). No results found.   Assessment:  Dwayne Thompson is a 74 y.o. male with extensive stage small cell lung cancer s/p left chest wall/pleural biopsy on 06/24/2020.  He presented with left hemothorax. Thoracentesis x2 (05/20/2020 and 06/05/2020) revealed bloody fluid with no evidence of malignancy.  He has a chest tube in place.  Chest CT on 06/05/2020 revealed pleural thickening, nodularity and pleural-based mass with central adenopathy versus mass suspicious for pulmonary neoplasm with extensive pleural metastasis.  Mesothelioma is in the differential.  There was increasing left subcarinal lymph node use into the left mainstem bronchus.  There was increasing upper abdominal lymph nodes.  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  There was accentuated activity along the consolidative and atelectatic left lower lobe probably from tumor within the lung but potentially from tumor along the visceral pleura.  There was hypermetabolic thoracic adenopathy along with hypermetabolic upper abdominal adenopathy and hypermetabolic skeletal metastatic lesions (right iliac bone SUV 11.9 and T1). There was possible early tumor extension into the left T11-12 neuroforamen with some erosion of the adjacent T11 vertebral body.  Thoracic spine MRI with and without contrast on 06/28/2020 revealed a left paraspinous soft tissue mass extending from T9-10 through T12-L1, with invasion/involvement of the adjacent T10 and T11 vertebral bodies. Tumor  partially extended into the left neural foramen at T11-12. There was no other significant extension into the spinal canal or epidural tumor. There were additional metastatic lesions involving the T1 and T10 vertebral bodies as well as the right pedicle of T6. There was no associated pathologic fracture. There was a heterogeneous and partially loculated left pleural effusion with consolidation signal intensity throughout the visualized left lower lung.  Brain MRI with and without contrast on 06/28/2020 revealed no evidence for intracranial metastatic disease. There was mild to moderate chronic microvascular ischemic disease.  He is day 9 s/p cycle #2 carboplatin, etoposide and Tecentriq with Neulasta support (07/06/2020-08/03/2020).  Patient has chemotherapy-induced anemia.  He began Retacrit on 08/06/2020.    He was admitted to [facility] from 07/21/2020 - 07/25/2020 with worsening shortness of breath x a few days. Bilateral lower extremity venous duplex showed bilateral DVT involving the popliteal and calf veins. Chest CT angiogram showed pulmonary emboli within all lobes of the right lung. He underwent mechanical thrombectomy of the right pulmonary arteries. He was started on heparin and then transitioned to Eliquis. He was also started on vancomycin and Zosyn for postobstructive pneumonia and was de-escalated to Augmentin on discharge. He was discharged on 3 liters/min nasal cannula oxygen.  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 He previously alcohol daily.  Symptomatically, he feels better today.  He is eating better.  He started physical therapy.  Exam has improved.  Counts are good.  Plan:   1.   Labs today:  CBC with diff, BMP. 2.   Extensive stage small cell lung cancer             PET scan on 06/12/2020 revealed pleural based disease, hypermetabolic adenopathy, and skeletal metastasis.  Head MRI  revealed no evidence of metastatic disase.  He is  day 9 s/p cycle #2 carboplatin, etoposide and Tecentriq with Neulasta support (08/03/2020).   He is doing well.  Counts are good.  Discuss plan for f/u CBC on Friday, 08/12/2020, after  Thanksgiving.   Patient will be called with CBC results and if transfusion needed.  Review plan for follow-up CT scan after recovery from this cycle.     Discuss symptom management.  He has antiemetics and pain medications at home to use on a prn bases.  Interventions are adequate.    3.   Recurrent pleural effusion  Pleurx catheter remains in place.  Output is minimal. 4.   Normocytic anemia             Hematocrit 25.3.  Hemoglobin 8.7.  MCV 86.1 today.   Ferritin 632 with an iron saturation of 75% (low) and TIBC 279 on 08/06/2020.  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  Continue to monitor.         5.   Cancer-related pain             Pain appears well controlled.  Note from [name], NP reviewed. 6.   Hyponatremia  Sodium 130 today.  Encourage fluids with electrolytes.  IV fluids today. 7.   Labs at [facility] on Friday (08/14/2020) - CBC with diff, type and screen. 8.   Chest and abdomen CT on 08/21/2020. 9.   RTC on 08/17/2020 for MD assessment, labs (CBC with diff, BMP) and +/- IVF.  I discussed the assessment and treatment plan with the patient.  The patient was provided an opportunity to ask questions and all were answered.  The patient agreed with the plan and demonstrated an understanding of the instructions.  The patient was advised to call back or seek an in person evaluation if the symptoms worsen or if the condition fails to improve as anticipated.  I provided 14 minutes of face-to-face time during this this encounter and > 50% was spent counseling as documented under my assessment and plan.   Nolon Stalls, MD, PhD  08/11/2020, 5:14 PM   I, De Burrs, am acting as a scribe for Lequita Asal, MD.  I, [place] Mike Gip, MD,  have reviewed the above documentation for accuracy and completeness, and I agree with the above.

## 2020-08-11 ENCOUNTER — Telehealth: Payer: Self-pay | Admitting: *Deleted

## 2020-08-11 ENCOUNTER — Encounter: Payer: Self-pay | Admitting: Hematology and Oncology

## 2020-08-11 ENCOUNTER — Inpatient Hospital Stay: Payer: Medicare Other

## 2020-08-11 ENCOUNTER — Telehealth: Payer: Self-pay

## 2020-08-11 ENCOUNTER — Other Ambulatory Visit: Payer: Self-pay

## 2020-08-11 ENCOUNTER — Inpatient Hospital Stay (HOSPITAL_BASED_OUTPATIENT_CLINIC_OR_DEPARTMENT_OTHER): Payer: Medicare Other | Admitting: Hematology and Oncology

## 2020-08-11 VITALS — BP 100/62

## 2020-08-11 VITALS — BP 92/57 | HR 87 | Temp 97.0°F | Resp 18 | Wt 162.0 lb

## 2020-08-11 DIAGNOSIS — I959 Hypotension, unspecified: Secondary | ICD-10-CM

## 2020-08-11 DIAGNOSIS — E871 Hypo-osmolality and hyponatremia: Secondary | ICD-10-CM

## 2020-08-11 DIAGNOSIS — C3490 Malignant neoplasm of unspecified part of unspecified bronchus or lung: Secondary | ICD-10-CM

## 2020-08-11 DIAGNOSIS — J90 Pleural effusion, not elsewhere classified: Secondary | ICD-10-CM | POA: Diagnosis not present

## 2020-08-11 DIAGNOSIS — Z5112 Encounter for antineoplastic immunotherapy: Secondary | ICD-10-CM | POA: Diagnosis not present

## 2020-08-11 DIAGNOSIS — C7951 Secondary malignant neoplasm of bone: Secondary | ICD-10-CM | POA: Diagnosis not present

## 2020-08-11 DIAGNOSIS — G893 Neoplasm related pain (acute) (chronic): Secondary | ICD-10-CM

## 2020-08-11 DIAGNOSIS — Z7189 Other specified counseling: Secondary | ICD-10-CM

## 2020-08-11 DIAGNOSIS — D649 Anemia, unspecified: Secondary | ICD-10-CM

## 2020-08-11 LAB — CBC WITH DIFFERENTIAL/PLATELET
Abs Immature Granulocytes: 0.11 10*3/uL — ABNORMAL HIGH (ref 0.00–0.07)
Basophils Absolute: 0 10*3/uL (ref 0.0–0.1)
Basophils Relative: 0 %
Eosinophils Absolute: 0 10*3/uL (ref 0.0–0.5)
Eosinophils Relative: 0 %
HCT: 25.3 % — ABNORMAL LOW (ref 39.0–52.0)
Hemoglobin: 8.7 g/dL — ABNORMAL LOW (ref 13.0–17.0)
Immature Granulocytes: 1 %
Lymphocytes Relative: 13 %
Lymphs Abs: 1.2 10*3/uL (ref 0.7–4.0)
MCH: 29.6 pg (ref 26.0–34.0)
MCHC: 34.4 g/dL (ref 30.0–36.0)
MCV: 86.1 fL (ref 80.0–100.0)
Monocytes Absolute: 0.4 10*3/uL (ref 0.1–1.0)
Monocytes Relative: 4 %
Neutro Abs: 7.5 10*3/uL (ref 1.7–7.7)
Neutrophils Relative %: 82 %
Platelets: 138 10*3/uL — ABNORMAL LOW (ref 150–400)
RBC: 2.94 MIL/uL — ABNORMAL LOW (ref 4.22–5.81)
RDW: 15.2 % (ref 11.5–15.5)
WBC: 9.2 10*3/uL (ref 4.0–10.5)
nRBC: 0 % (ref 0.0–0.2)

## 2020-08-11 LAB — BASIC METABOLIC PANEL
Anion gap: 9 (ref 5–15)
BUN: 11 mg/dL (ref 8–23)
CO2: 24 mmol/L (ref 22–32)
Calcium: 9.1 mg/dL (ref 8.9–10.3)
Chloride: 97 mmol/L — ABNORMAL LOW (ref 98–111)
Creatinine, Ser: 0.66 mg/dL (ref 0.61–1.24)
GFR, Estimated: >60 mL/min (ref >60)
Glucose, Bld: 109 mg/dL — ABNORMAL HIGH (ref 70–99)
Potassium: 4.1 mmol/L (ref 3.5–5.1)
Sodium: 130 mmol/L — ABNORMAL LOW (ref 135–145)

## 2020-08-11 MED ORDER — SODIUM CHLORIDE 0.9% FLUSH
10.0000 mL | Freq: Once | INTRAVENOUS | Status: AC | PRN
  Administered 2020-08-11: 10 mL
  Filled 2020-08-11: qty 10

## 2020-08-11 MED ORDER — SODIUM CHLORIDE 0.9 % IV SOLN
Freq: Once | INTRAVENOUS | Status: AC
  Filled 2020-08-11: qty 250

## 2020-08-11 MED ORDER — HEPARIN SOD (PORK) LOCK FLUSH 100 UNIT/ML IV SOLN
500.0000 [IU] | Freq: Once | INTRAVENOUS | Status: AC | PRN
  Administered 2020-08-11: 500 [IU]
  Filled 2020-08-11: qty 5

## 2020-08-11 NOTE — Telephone Encounter (Signed)
Patient's wife stated they did not get but one drop of fluid when draining the pleurx catheter - she asked if they could go to once weekly and told yes unless he complains of short of breath then may want to go back to twice weekly.

## 2020-08-11 NOTE — Progress Notes (Signed)
Decreased drainage in pleurX cath (only one drop). Cant get in touch with MD who placed it. Patient reports SOB. He is on 3L of O2.  They are asking where the kidney doctor is and about oxycontin prescription.

## 2020-08-11 NOTE — Telephone Encounter (Signed)
Patients wife Marcie Bal called and has some questions about Mr Schmale drain. She stated that its not been draining that much since Sunday and when she saw Dr Genevive Bi he had mention something about taking it out. But patient stated that the oncologist said to wait. So they was wanting to know does the drain still need to be drained over the thanksgiving week.

## 2020-08-11 NOTE — Progress Notes (Signed)
Patient received prescribed treatment in clinic. Tolerated well. Patient stable at discharge. 

## 2020-08-11 NOTE — Telephone Encounter (Signed)
DR.Corcoran-Anticoagulant clearance re-faxed to Dr.Corcoran at this time.

## 2020-08-12 ENCOUNTER — Ambulatory Visit (INDEPENDENT_AMBULATORY_CARE_PROVIDER_SITE_OTHER): Payer: Medicare Other | Admitting: Nurse Practitioner

## 2020-08-12 ENCOUNTER — Other Ambulatory Visit: Payer: Self-pay

## 2020-08-12 ENCOUNTER — Ambulatory Visit (INDEPENDENT_AMBULATORY_CARE_PROVIDER_SITE_OTHER): Payer: Medicare Other

## 2020-08-12 VITALS — BP 106/68 | HR 97 | Ht 71.0 in | Wt 161.0 lb

## 2020-08-12 DIAGNOSIS — I10 Essential (primary) hypertension: Secondary | ICD-10-CM

## 2020-08-12 DIAGNOSIS — I82433 Acute embolism and thrombosis of popliteal vein, bilateral: Secondary | ICD-10-CM

## 2020-08-12 DIAGNOSIS — E785 Hyperlipidemia, unspecified: Secondary | ICD-10-CM | POA: Diagnosis not present

## 2020-08-14 ENCOUNTER — Other Ambulatory Visit
Admission: RE | Admit: 2020-08-14 | Discharge: 2020-08-14 | Disposition: A | Discharge status: Home / Self Care | Payer: Medicare Other | Attending: Hematology and Oncology | Admitting: Hematology and Oncology

## 2020-08-14 DIAGNOSIS — J90 Pleural effusion, not elsewhere classified: Secondary | ICD-10-CM | POA: Diagnosis present

## 2020-08-14 DIAGNOSIS — C7951 Secondary malignant neoplasm of bone: Secondary | ICD-10-CM | POA: Insufficient documentation

## 2020-08-14 DIAGNOSIS — D649 Anemia, unspecified: Secondary | ICD-10-CM | POA: Insufficient documentation

## 2020-08-14 DIAGNOSIS — G893 Neoplasm related pain (acute) (chronic): Secondary | ICD-10-CM | POA: Insufficient documentation

## 2020-08-14 DIAGNOSIS — C3490 Malignant neoplasm of unspecified part of unspecified bronchus or lung: Secondary | ICD-10-CM | POA: Insufficient documentation

## 2020-08-14 DIAGNOSIS — E871 Hypo-osmolality and hyponatremia: Secondary | ICD-10-CM | POA: Insufficient documentation

## 2020-08-14 DIAGNOSIS — Z7189 Other specified counseling: Secondary | ICD-10-CM | POA: Diagnosis present

## 2020-08-14 LAB — CBC WITH DIFFERENTIAL/PLATELET
Abs Immature Granulocytes: 0.03 10*3/uL (ref 0.00–0.07)
Basophils Absolute: 0 10*3/uL (ref 0.0–0.1)
Basophils Relative: 1 %
Eosinophils Absolute: 0 10*3/uL (ref 0.0–0.5)
Eosinophils Relative: 0 %
HCT: 25.4 % — ABNORMAL LOW (ref 39.0–52.0)
Hemoglobin: 8.2 g/dL — ABNORMAL LOW (ref 13.0–17.0)
Immature Granulocytes: 1 %
Lymphocytes Relative: 21 %
Lymphs Abs: 1.2 10*3/uL (ref 0.7–4.0)
MCH: 29 pg (ref 26.0–34.0)
MCHC: 32.3 g/dL (ref 30.0–36.0)
MCV: 89.8 fL (ref 80.0–100.0)
Monocytes Absolute: 0.5 10*3/uL (ref 0.1–1.0)
Monocytes Relative: 9 %
Neutro Abs: 3.7 10*3/uL (ref 1.7–7.7)
Neutrophils Relative %: 68 %
Platelets: 121 10*3/uL — ABNORMAL LOW (ref 150–400)
RBC: 2.83 MIL/uL — ABNORMAL LOW (ref 4.22–5.81)
RDW: 15.7 % — ABNORMAL HIGH (ref 11.5–15.5)
WBC: 5.4 10*3/uL (ref 4.0–10.5)
nRBC: 0 % (ref 0.0–0.2)

## 2020-08-14 LAB — BASIC METABOLIC PANEL
Anion gap: 10 (ref 5–15)
BUN: 10 mg/dL (ref 8–23)
CO2: 26 mmol/L (ref 22–32)
Calcium: 9.2 mg/dL (ref 8.9–10.3)
Chloride: 98 mmol/L (ref 98–111)
Creatinine, Ser: 0.72 mg/dL (ref 0.61–1.24)
GFR, Estimated: >60 mL/min (ref >60)
Glucose, Bld: 100 mg/dL — ABNORMAL HIGH (ref 70–99)
Potassium: 3.8 mmol/L (ref 3.5–5.1)
Sodium: 134 mmol/L — ABNORMAL LOW (ref 135–145)

## 2020-08-16 DIAGNOSIS — E871 Hypo-osmolality and hyponatremia: Secondary | ICD-10-CM | POA: Insufficient documentation

## 2020-08-16 NOTE — Progress Notes (Signed)
[facility]  [address] [place], [place] [address] Phone: [phone]  Fax: [phone]   Office Visit:  08/17/20  Referring physician: Jerrol Banana.,*  Chief Complaint: Dwayne Thompson is a 74 y.o. male with extensive stage small cell lung cancer currently day 15 s/p cycle #2 carboplatin, etoposide, and Tecentriq.  HPI:  The patient was last seen in the medical oncology clinic on 08/11/2020. At that time, he was feeling better.  He had started physical therapy.  He was eating better.  Exam had improved. Hematocrit was 25.3, hemoglobin 8.7, MCV 86.1, platelets 138,000, WBC 9,200.  Sodium was 130. He received IVF.  The patient saw Eulogio Ditch, NP on 08/12/2020. Left lower extremity venous duplex revealed no evidence of DVT.  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  He was prescribed compression stockings.  It was recommended that he continue Eliquis throughout the duration of his treatment.  He was to continue to use compression socks.  Labs on 08/14/2020 revealed a hematocrit of 25.4, hemoglobin 8.2, MCV 89.8, platelets 121,000, WBC 5,400 (ANC 3700). Sodium was 134.   The patient is scheduled for a chest and abdomen CT with contrast on 08/20/2020.  During the interim, he has been "fine." He was nauseous, had a headache, and felt hot a few minutes ago. These symptoms went away just now. He reports constipation and nose bleeds. He denies diarrhea.   The patient ate a lot on Thanksgiving day but now he is not eating much. The patient has been drinking more juice but does not drink a lot of water. He does not feel that he needs fluids today. He needs a refill of oxycodone, which he takes 4 times per day. The patient never got any of the long-acting pain medication because the pharmacy would not fill the prescription.  His Pleurx catheter was drained yesterday; only bubbles came out.   Past  Medical History:  Diagnosis Date  . Anemia   . Back pain   . BPH (benign prostatic hyperplasia)   . Cirrhosis ([place])   . COPD (chronic obstructive pulmonary disease) ([place])   . ED (erectile dysfunction)   . GERD (gastroesophageal reflux disease)   . Hyperlipidemia   . Hypertension    Past Surgical History:  Procedure Laterality Date  . COLONOSCOPY Left 01/03/2018   Procedure: COLONOSCOPY;  Surgeon: Virgel Manifold, MD;  Location: [facility] ENDOSCOPY;  Service: Endoscopy;  Laterality: Left;  . COLONOSCOPY WITH PROPOFOL N/A 04/05/2018   Procedure: COLONOSCOPY WITH PROPOFOL;  Surgeon: Virgel Manifold, MD;  Location: [facility] ENDOSCOPY;  Service: Endoscopy;  Laterality: N/A;  . CYSTOSCOPY WITH STENT PLACEMENT Right 09/26/2019   Procedure: CYSTOSCOPY WITH STENT PLACEMENT;  Surgeon: Royston Cowper, MD;  Location: [facility] ORS;  Service: Urology;  Laterality: Right;  . ENTEROSCOPY  01/03/2018   Procedure: ENTEROSCOPY;  Surgeon: Virgel Manifold, MD;  Location[facility] ENDOSCOPY;  Service: Endoscopy;;  . ESOPHAGOGASTRODUODENOSCOPY N/A 01/02/2018   Procedure: ESOPHAGOGASTRODUODENOSCOPY (EGD);  Surgeon: Virgel Manifold, MD;  Location: [facility] ENDOSCOPY;  Service: Endoscopy;  Laterality: N/A;  . ESOPHAGOGASTRODUODENOSCOPY (EGD) WITH PROPOFOL N/A 04/05/2018   Procedure: ESOPHAGOGASTRODUODENOSCOPY (EGD) WITH PROPOFOL;  Surgeon: Virgel Manifold, MD;  Location: [facility] ENDOSCOPY;  Service: Endoscopy;  Laterality: N/A;  . exploration and neurolysis of right peroneal nerve at the knee Right 08/27/2018  . GREEN LIGHT LASER TURP (TRANSURETHRAL RESECTION OF PROSTATE N/A 09/26/2019   Procedure: GREEN LIGHT LASER TURP (  TRANSURETHRAL RESECTION OF PROSTATE;  Surgeon: Royston Cowper, MD;  Location: [facility] ORS;  Service: Urology;  Laterality: N/A;  . HERNIA REPAIR     1980's  . PORTACATH PLACEMENT Left 06/29/2020   Procedure: INSERTION PORT-A-CATH;  Surgeon: [name], MD;  Location: [facility] ORS;  Service: Thoracic;   Laterality: Left;  . PULMONARY THROMBECTOMY N/A 07/22/2020   Procedure: PULMONARY THROMBECTOMY;  Surgeon: Algernon Huxley, MD;  Location: [facility];  Service: Cardiovascular;  Laterality: N/A;  . VIDEO ASSISTED THORACOSCOPY (VATS)/THOROCOTOMY Left 06/29/2020   Procedure: VIDEO ASSISTED THORACOSCOPY;  Surgeon: [name], MD;  Location: [facility] ORS;  Service: Thoracic;  Laterality: Left;   Family History  Problem Relation Age of Onset  . Alzheimer's disease Mother   . Sarcoidosis Sister   . Anxiety disorder Brother   . Kidney disease Neg Hx   . Prostate cancer Neg Hx     Social History: reports that he quit smoking about 6 years ago. His smoking use included cigarettes. He has a 50.00 pack-year smoking history. He has never used smokeless tobacco. He reports current alcohol use of about 21.0 - 28.0 standard drinks of alcohol per week. He reports that he does not use drugs. He drinks 2-3 alcohol shots/day. His wife's name isJanice and granddaughter's name isAriel. The patient is accompanied by his wife today.  Allergies:  Allergies  Allergen Reactions  . Indomethacin Swelling    ankle swelling  . Tamsulosin Hcl Hives  . Sulfa Antibiotics Rash   Current Outpatient Medications  Medication Instructions  . acetaminophen (TYLENOL) 650 mg, Dwayne, 3 times daily  . apixaban (ELIQUIS) 5 MG TABS tablet Take 10mg  (2 tabs) twice daily for 7 days. Then on 8th day, take 5mg  (1 tab) twice daily.  . cholecalciferol (VITAMIN D3) 1,000 Units, Dwayne, Daily  . cyanocobalamin 1,000 mcg, Dwayne, Daily  . dutasteride (AVODART) 0.5 mg, Dwayne, Daily  . feeding supplement (ENSURE ENLIVE / ENSURE PLUS) LIQD 237 mLs, Dwayne, 2 times daily between meals  . folic acid (FOLVITE) 1 mg, Dwayne, Daily  . lactulose (CHRONULAC) 30 g, Dwayne, 2 times daily  . lidocaine (LIDODERM) 5 % 1 patch, Transdermal, Every 24 hours, Remove & Discard patch within 12 hours or as directed by MD  . Lidocaine (SALONPAS PAIN RELIEVING)  4 % PTCH 1 patch, Apply externally,  Every morning - 10a  . loratadine (CLARITIN) 10 mg, Dwayne, Daily  . megestrol (MEGACE) 200 mg, Dwayne, Daily  . midodrine (PROAMATINE) 10 mg, Dwayne, 4 times daily  . montelukast (SINGULAIR) 10 MG tablet TAKE 1 TABLET BY MOUTH EVERY DAY  . ondansetron (ZOFRAN) 8 mg, Dwayne, 2 times daily PRN, Start on day 3 after carboplatin chemo.  Marland Kitchen oxyCODONE (OXYCONTIN) 15 mg, Dwayne, Every 12 hours  . oxyCODONE (ROXICODONE) 15 mg, Dwayne, Every 4 hours PRN  . pantoprazole (PROTONIX) 20 MG tablet TAKE ONE TABLET BY MOUTH TWICE A DAY  . polyethylene glycol (MIRALAX / GLYCOLAX) 17 g, Dwayne, Daily PRN  . potassium chloride (KLOR-CON) 10 MEQ tablet 10 mEq, Dwayne, 2 times daily  . prochlorperazine (COMPAZINE) 10 mg, Dwayne, Every 6 hours PRN  . rosuvastatin (CRESTOR) 10 mg, Dwayne, Daily    Review of Systems  Constitutional: Negative for chills, diaphoresis, fever, malaise/fatigue and weight loss (up 8 lbs).       Feels "fine."  HENT: Positive for nosebleeds. Negative for congestion, ear discharge, ear pain, hearing loss, sinus pain, sore throat and tinnitus.   Eyes: Negative for blurred  vision.  Respiratory: Positive for shortness of breath (on exertion). Negative for cough, hemoptysis and sputum production.        Left-sided chest wall pain. On 3 liters oxygen.  Cardiovascular: Negative for chest pain, palpitations and leg swelling.  Gastrointestinal: Positive for constipation (takes miralax) and nausea (a few minutes ago). Negative for abdominal pain, blood in stool, diarrhea, heartburn, melena and vomiting.       Ate a lot on Thanksgiving. Fluid intake has improved.  Genitourinary: Negative for dysuria, frequency, hematuria and urgency.  Musculoskeletal: Negative for back pain, joint pain, myalgias and neck pain.  Skin: Negative for itching and rash.  Neurological: Positive for headaches (a few minutes ago). Negative for dizziness, tingling, sensory change and weakness.   Endo/Heme/Allergies: Does not bruise/bleed easily.  Psychiatric/Behavioral: Negative for depression and memory loss. The patient is not nervous/anxious and does not have insomnia.   All other systems reviewed and are negative.   Performance status: 2   Vital Signs Blood pressure 110/70, pulse 98, temperature (!) 97.1 F (36.2 C), temperature source Dwayne, resp. rate 12, weight 170 lb 10.2 oz (77.4 kg), SpO2 99 %.  Physical Exam Vitals and nursing note reviewed.  Constitutional:      General: He is not in acute distress.    Appearance: He is not diaphoretic.  HENT:     Head: Normocephalic and atraumatic.     Comments:  Short graying hair    Mouth/Throat:     Mouth: Mucous membranes are moist.     Pharynx: Oropharynx is clear.  Eyes:     General: No scleral icterus.    Extraocular Movements: Extraocular movements intact.     Conjunctiva/sclera: Conjunctivae normal.     Pupils: Pupils are equal, round, and reactive to light.     Comments: Glasses. Brown eyes.   Cardiovascular:     Rate and Rhythm: Normal rate and regular rhythm.     Heart sounds: Normal heart sounds. No murmur heard.   Pulmonary:     Effort: Pulmonary effort is normal. No respiratory distress.     Breath sounds: Normal breath sounds. No wheezing or rales.  Chest:     Chest wall: No tenderness.  Abdominal:     General: Bowel sounds are normal. There is no distension.     Palpations: Abdomen is soft. There is no mass.     Tenderness: There is no abdominal tenderness. There is no guarding or rebound.  Musculoskeletal:        General: No swelling or tenderness. Normal range of motion.     Cervical back: Normal range of motion and neck supple.     Right lower leg: Edema (trace) present.     Left lower leg: Edema (trace) present.  Lymphadenopathy:     Head:     Right side of head: No preauricular, posterior auricular or occipital adenopathy.     Left side of head: No preauricular, posterior auricular or  occipital adenopathy.     Cervical: No cervical adenopathy.     Upper Body:     Right upper body: No supraclavicular or axillary adenopathy.     Left upper body: No supraclavicular or axillary adenopathy.     Lower Body: No right inguinal adenopathy. No left inguinal adenopathy.  Skin:    General: Skin is warm and dry.  Neurological:     Mental Status: He is alert and oriented to person, place, and time.  Psychiatric:  Behavior: Behavior normal.        Thought Content: Thought content normal.        Judgment: Judgment normal.    Results for orders placed or performed in visit on 08/17/20 (from the past 48 hour(s))  CBC with Differential/Platelet     Status: Abnormal   Collection Time: 08/17/20  8:06 AM  Result Value Ref Range   WBC 5.3 4.0 - 10.5 K/uL   RBC 2.98 (L) 4.22 - 5.81 MIL/uL   Hemoglobin 8.5 (L) 13.0 - 17.0 g/dL   HCT 26.1 (L) 39 - 52 %   MCV 87.6 80.0 - 100.0 fL   MCH 28.5 26.0 - 34.0 pg   MCHC 32.6 30.0 - 36.0 g/dL   RDW 15.8 (H) 11.5 - 15.5 %   Platelets 94 (L) 150 - 400 K/uL    Comment: Immature Platelet Fraction may be clinically indicated, consider ordering this additional test [id number]    nRBC 0.0 0.0 - 0.2 %   Neutrophils Relative % 80 %   Neutro Abs 4.2 1.7 - 7.7 K/uL   Lymphocytes Relative 15 %   Lymphs Abs 0.8 0.7 - 4.0 K/uL   Monocytes Relative 5 %   Monocytes Absolute 0.3 0.1 - 1.0 K/uL   Eosinophils Relative 0 %   Eosinophils Absolute 0.0 0.0 - 0.5 K/uL   Basophils Relative 0 %   Basophils Absolute 0.0 0.0 - 0.1 K/uL   Immature Granulocytes 0 %   Abs Immature Granulocytes 0.02 0.00 - 0.07 K/uL    Comment: Performed at [facility], [address] [place], [place] [address]  Basic metabolic panel     Status: Abnormal   Collection Time: 08/17/20  8:06 AM  Result Value Ref Range   Sodium 134 (L) 135 - 145 mmol/L   Potassium 3.7 3.5 - 5.1 mmol/L   Chloride 101 98 - 111 mmol/L   CO2 24 22 - 32 mmol/L   Glucose, Bld 129 (H)  70 - 99 mg/dL    Comment: Glucose reference range applies only to samples taken after fasting for at least 8 hours.   BUN 11 8 - 23 mg/dL   Creatinine, Ser 0.72 0.61 - 1.24 mg/dL   Calcium 9.3 8.9 - 10.3 mg/dL   GFR, Estimated >60 >60 mL/min    Comment: (NOTE) Calculated using the CKD-EPI Creatinine Equation (2021)    Anion gap 9 5 - 15    Comment: Performed at [facility], [address] [place], [place] [address]   No results found.  Assessment:  Dwayne Thompson is a 74 y.o. male with extensive stage small cell lung cancer s/p left chest wall/pleural biopsy on 06/24/2020.  He presented with left hemothorax. Thoracentesis x2 (05/20/2020 and 06/05/2020) revealed bloody fluid with no evidence of malignancy.  He has a chest tube in place.  Chest CT on 06/05/2020 revealed pleural thickening, nodularity and pleural-based mass with central adenopathy versus mass suspicious for pulmonary neoplasm with extensive pleural metastasis.  Mesothelioma is in the differential.  There was increasing left subcarinal lymph node use into the left mainstem bronchus.  There was increasing upper abdominal lymph nodes.  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  There was accentuated activity along the consolidative and atelectatic left lower lobe probably from tumor within the lung but potentially from tumor along the visceral pleura.  There was hypermetabolic thoracic adenopathy along with  hypermetabolic upper abdominal adenopathy and hypermetabolic skeletal metastatic lesions (right iliac bone SUV 11.9 and T1). There was possible early tumor extension into the left T11-12 neuroforamen with some erosion of the adjacent T11 vertebral body.  Thoracic spine MRI with and without contrast on 06/28/2020 revealed a left paraspinous soft tissue mass extending from T9-10 through T12-L1, with invasion/involvement of  the adjacent T10 and T11 vertebral bodies. Tumor partially extended into the left neural foramen at T11-12. There was no other significant extension into the spinal canal or epidural tumor. There were additional metastatic lesions involving the T1 and T10 vertebral bodies as well as the right pedicle of T6. There was no associated pathologic fracture. There was a heterogeneous and partially loculated left pleural effusion with consolidation signal intensity throughout the visualized left lower lung.  Brain MRI with and without contrast on 06/28/2020 revealed no evidence for intracranial metastatic disease. There was mild to moderate chronic microvascular ischemic disease.  He is day 15 s/p cycle #2 carboplatin, etoposide and Tecentriq with Neulasta support (07/06/2020-08/03/2020).  He was admitted to [facility] from 07/21/2020 - 07/25/2020 with worsening shortness of breath x a few days. Bilateral lower extremity venous duplex showed bilateral DVT involving the popliteal and calf veins. Chest CT angiogram showed pulmonary emboli within all lobes of the right lung. He underwent mechanical thrombectomy of the right pulmonary arteries. He was started on heparin and then transitioned to Eliquis. He was also started on vancomycin and Zosyn for postobstructive pneumonia and was de-escalated to Augmentin on discharge. He was discharged on 3 liters/min nasal cannula oxygen.  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 He previously alcohol daily.  Symptomatically, he feels "fine." He describes transient nausea. He has has constipation and nose bleeds. He ate a lot on Thanksgiving day but now he is not eating much. The patient has been drinking more juice but does not drink a lot of water. He does not feel that he needs fluids today. He is taking oxycodone, 4 times per day.  He did not receive any of the long-acting pain medications.  His Pleurx catheter is not draining any  fluid.  Plan:   1.   Labs today: CBC with diff, BMP. 2.   Extensive stage small cell lung cancer PET scan on 06/12/2020 revealed pleural based disease, hypermetabolic adenopathy, and skeletal metastasis.             Head MRI revealed no evidence of metastatic disase.             He is day 15 s/p cycle #2 carboplatin, etoposide and Tecentriq with Neulasta support (08/03/2020).                         He tolerated his chemotherapy well.             Review plans for follow-up CT scans.                Discuss symptom management.  He has antiemetics and pain medications at home to use on a prn bases.  Interventions are adequate.    3.   Recurrent pleural effusion             Pleurx catheter remains in place.             Output is minimal/none.  Per cardiothoracic surgery, he will need to be off Eliquis for drain removal. 4. Normocytic anemia Hematocrit  26.1.  Hemoglobin 8.5.  MCV 87.6 today.                         Ferritin 632 with an iron saturation of 75% (low) and TIBC 279 on 08/06/2020.             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             Continue Retacrit every 2 weeks per protocol. 5.Cancer-related pain He is using oxycodone 4 times/day.  Long acting pain medications (Xtampza ER) are not available per his pharmacy.             Follow-up with Billey Chang, NP. 6.   Hyponatremia             Sodium 134 today.             Continue to encourage fluids with electrolytes.             No IVF fluid needed today. 7.   Please cancel Dr Elwyn Lade appt (no longer needed at this time). 8.   RTC on 08/20/2020 for labs (CBC with diff) and +/- Retacrit. 9.   RTC on 08/24/2020 for MD assessment, labs (CBC with diff, CMP, Mg, TSH), review of CT scans, and +/- day 1 of cycle #3 carboplatin, etoposide, and Tecentriq.  I discussed the assessment and treatment plan with the patient.  The patient was provided an  opportunity to ask questions and all were answered.  The patient agreed with the plan and demonstrated an understanding of the instructions.  The patient was advised to call back or seek an in person evaluation if the symptoms worsen or if the condition fails to improve as anticipated.   Nolon Stalls, MD, PhD  08/17/2020, 9:16 AM   I, De Burrs, am acting as a scribe for Lequita Asal, MD.  I, Thayer Mike Gip, MD, have reviewed the above documentation for accuracy and completeness, and I agree with the above.

## 2020-08-17 ENCOUNTER — Inpatient Hospital Stay: Payer: Medicare Other

## 2020-08-17 ENCOUNTER — Ambulatory Visit: Payer: Medicare Other | Admitting: Hematology and Oncology

## 2020-08-17 ENCOUNTER — Encounter: Payer: Self-pay | Admitting: Hematology and Oncology

## 2020-08-17 ENCOUNTER — Other Ambulatory Visit: Payer: Self-pay

## 2020-08-17 ENCOUNTER — Ambulatory Visit: Payer: Medicare Other

## 2020-08-17 ENCOUNTER — Other Ambulatory Visit: Payer: Self-pay | Admitting: Hematology and Oncology

## 2020-08-17 ENCOUNTER — Other Ambulatory Visit: Payer: Medicare Other

## 2020-08-17 ENCOUNTER — Inpatient Hospital Stay (HOSPITAL_BASED_OUTPATIENT_CLINIC_OR_DEPARTMENT_OTHER): Payer: Medicare Other | Admitting: Hematology and Oncology

## 2020-08-17 VITALS — BP 110/70 | HR 98 | Temp 97.1°F | Resp 12 | Wt 170.6 lb

## 2020-08-17 DIAGNOSIS — D649 Anemia, unspecified: Secondary | ICD-10-CM | POA: Diagnosis not present

## 2020-08-17 DIAGNOSIS — C7951 Secondary malignant neoplasm of bone: Secondary | ICD-10-CM | POA: Diagnosis not present

## 2020-08-17 DIAGNOSIS — Z5112 Encounter for antineoplastic immunotherapy: Secondary | ICD-10-CM | POA: Diagnosis not present

## 2020-08-17 DIAGNOSIS — C3490 Malignant neoplasm of unspecified part of unspecified bronchus or lung: Secondary | ICD-10-CM

## 2020-08-17 DIAGNOSIS — J90 Pleural effusion, not elsewhere classified: Secondary | ICD-10-CM | POA: Diagnosis not present

## 2020-08-17 DIAGNOSIS — E871 Hypo-osmolality and hyponatremia: Secondary | ICD-10-CM

## 2020-08-17 DIAGNOSIS — G893 Neoplasm related pain (acute) (chronic): Secondary | ICD-10-CM

## 2020-08-17 LAB — BASIC METABOLIC PANEL
Anion gap: 9 (ref 5–15)
BUN: 11 mg/dL (ref 8–23)
CO2: 24 mmol/L (ref 22–32)
Calcium: 9.3 mg/dL (ref 8.9–10.3)
Chloride: 101 mmol/L (ref 98–111)
Creatinine, Ser: 0.72 mg/dL (ref 0.61–1.24)
GFR, Estimated: >60 mL/min (ref >60)
Glucose, Bld: 129 mg/dL — ABNORMAL HIGH (ref 70–99)
Potassium: 3.7 mmol/L (ref 3.5–5.1)
Sodium: 134 mmol/L — ABNORMAL LOW (ref 135–145)

## 2020-08-17 LAB — CBC WITH DIFFERENTIAL/PLATELET
Abs Immature Granulocytes: 0.02 10*3/uL (ref 0.00–0.07)
Basophils Absolute: 0 10*3/uL (ref 0.0–0.1)
Basophils Relative: 0 %
Eosinophils Absolute: 0 10*3/uL (ref 0.0–0.5)
Eosinophils Relative: 0 %
HCT: 26.1 % — ABNORMAL LOW (ref 39.0–52.0)
Hemoglobin: 8.5 g/dL — ABNORMAL LOW (ref 13.0–17.0)
Immature Granulocytes: 0 %
Lymphocytes Relative: 15 %
Lymphs Abs: 0.8 10*3/uL (ref 0.7–4.0)
MCH: 28.5 pg (ref 26.0–34.0)
MCHC: 32.6 g/dL (ref 30.0–36.0)
MCV: 87.6 fL (ref 80.0–100.0)
Monocytes Absolute: 0.3 10*3/uL (ref 0.1–1.0)
Monocytes Relative: 5 %
Neutro Abs: 4.2 10*3/uL (ref 1.7–7.7)
Neutrophils Relative %: 80 %
Platelets: 94 10*3/uL — ABNORMAL LOW (ref 150–400)
RBC: 2.98 MIL/uL — ABNORMAL LOW (ref 4.22–5.81)
RDW: 15.8 % — ABNORMAL HIGH (ref 11.5–15.5)
WBC: 5.3 10*3/uL (ref 4.0–10.5)
nRBC: 0 % (ref 0.0–0.2)

## 2020-08-17 MED ORDER — OXYCODONE HCL 15 MG PO TABS: 15.0000 mg | ORAL_TABLET | ORAL | 0 refills | Status: DC | PRN

## 2020-08-17 NOTE — Progress Notes (Signed)
Patient here for follow up wife reports that he has lost approx 40 pounds since October, patient reports sudden onset headache this am level 2 pain and on going constipation.

## 2020-08-19 ENCOUNTER — Encounter (INDEPENDENT_AMBULATORY_CARE_PROVIDER_SITE_OTHER): Payer: Self-pay | Admitting: Nurse Practitioner

## 2020-08-19 NOTE — Progress Notes (Signed)
Subjective:    Patient ID: Dwayne Thompson, male    DOB: 1946/07/11, 74 y.o.   MRN: [id number] Chief Complaint  Patient presents with  . Follow-up    U/S follow up    The patient presents today after being diagnosed with bilateral DVTs and pulmonary embolism.  The patient had intervention done on 07/22/2020 including:  Procedure(s) Performed:             1.  Contrast injection right heart             2.  Mechanical thrombectomy right main, upper lobe, middle lobe, and lower lobe pulmonary arteries             3.  Selective catheter placement right upper lobe, middle lobe, and lower lobe pulmonary arteries  The patient also has a history of small cell lung cancer for which she is currently undergoing treatment.  The patient notes that the shortness of breath is somewhat improved.  The patient does not have extensive lower extremity swelling.  Overall he has had some improvement since intervention.  He denies any fever, chills, nausea, vomiting or diarrhea.  Today noninvasive study showed no evidence of DVT in the left lower extremity.  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   Review of Systems  Cardiovascular: Positive for leg swelling.  Hematological: Bruises/bleeds easily.  All other systems reviewed and are negative.      Objective:   Physical Exam Vitals reviewed.  HENT:     Head: Normocephalic.  Cardiovascular:     Rate and Rhythm: Normal rate.     Pulses: Normal pulses.  Pulmonary:     Effort: Pulmonary effort is normal.  Neurological:     Mental Status: He is alert and oriented to person, place, and time. Mental status is at baseline.     Motor: Weakness present.  Psychiatric:        Mood and Affect: Mood normal.        Behavior: Behavior normal.        Thought Content: Thought content normal.        Judgment: Judgment normal.     BP 106/68   Pulse 97   Ht 5\' 11"  (1.803 m)   Wt 161 lb (73 kg)   BMI 22.45  kg/m   Past Medical History:  Diagnosis Date  . Anemia   . Back pain   . BPH (benign prostatic hyperplasia)   . Cirrhosis ([place])   . COPD (chronic obstructive pulmonary disease) ([place])   . ED (erectile dysfunction)   . GERD (gastroesophageal reflux disease)   . Hyperlipidemia   . Hypertension     Social History   Socioeconomic History  . Marital status: Married    Spouse name: [name]  . Number of children: 3  . Years of education: 37  . Highest education level: 12th grade  Occupational History  . Occupation: retired  Tobacco Use  . Smoking status: Former Smoker    Packs/day: 1.00    Years: 50.00    Pack years: 50.00    Types: Cigarettes    Quit date: 10/20/2013    Years since quitting: 6.8  . Smokeless tobacco: Never Used  Vaping Use  . Vaping Use: Never used  Substance and Sexual Activity  . Alcohol use: Not Currently    Comment: 3-4 gin and sodas a day  . Drug use: No  . Sexual activity: Not Currently  Other Topics Concern  . Not on file  Social History Narrative  . Not on file   Social Determinants of Health   Financial Resource Strain: Low Risk   . Difficulty of Paying Living Expenses: Not hard at all  Food Insecurity: No Food Insecurity  . Worried About Charity fundraiser in the Last Year: Never true  . Ran Out of Food in the Last Year: Never true  Transportation Needs: No Transportation Needs  . Lack of Transportation (Medical): No  . Lack of Transportation (Non-Medical): No  Physical Activity: Insufficiently Active  . Days of Exercise per Week: 1 day  . Minutes of Exercise per Session: 60 min  Stress: No Stress Concern Present  . Feeling of Stress : Not at all  Social Connections: Moderately Integrated  . Frequency of Communication with Friends and Family: Three times a week  . Frequency of Social Gatherings with Friends and Family: Once a week  . Attends Religious Services: Never  . Active Member of Clubs or Organizations: Yes  . Attends Theatre manager Meetings: 1 to 4 times per year  . Marital Status: Married  Human resources officer Violence: Not At Risk  . Fear of Current or Ex-Partner: No  . Emotionally Abused: No  . Physically Abused: No  . Sexually Abused: No    Past Surgical History:  Procedure Laterality Date  . COLONOSCOPY Left 01/03/2018   Procedure: COLONOSCOPY;  Surgeon: Virgel Manifold, MD;  Location: [facility] ENDOSCOPY;  Service: Endoscopy;  Laterality: Left;  . COLONOSCOPY WITH PROPOFOL N/A 04/05/2018   Procedure: COLONOSCOPY WITH PROPOFOL;  Surgeon: Virgel Manifold, MD;  Location: [facility] ENDOSCOPY;  Service: Endoscopy;  Laterality: N/A;  . CYSTOSCOPY WITH STENT PLACEMENT Right 09/26/2019   Procedure: CYSTOSCOPY WITH STENT PLACEMENT;  Surgeon: Royston Cowper, MD;  Location: [facility] ORS;  Service: Urology;  Laterality: Right;  . ENTEROSCOPY  01/03/2018   Procedure: ENTEROSCOPY;  Surgeon: Virgel Manifold, MD;  Location: [facility] ENDOSCOPY;  Service: Endoscopy;;  . ESOPHAGOGASTRODUODENOSCOPY N/A 01/02/2018   Procedure: ESOPHAGOGASTRODUODENOSCOPY (EGD);  Surgeon: Virgel Manifold, MD;  Location: [facility] ENDOSCOPY;  Service: Endoscopy;  Laterality: N/A;  . ESOPHAGOGASTRODUODENOSCOPY (EGD) WITH PROPOFOL N/A 04/05/2018   Procedure: ESOPHAGOGASTRODUODENOSCOPY (EGD) WITH PROPOFOL;  Surgeon: Virgel Manifold, MD;  Location: [facility] ENDOSCOPY;  Service: Endoscopy;  Laterality: N/A;  . exploration and neurolysis of right peroneal nerve at the knee Right 08/27/2018  . GREEN LIGHT LASER TURP (TRANSURETHRAL RESECTION OF PROSTATE N/A 09/26/2019   Procedure: GREEN LIGHT LASER TURP (TRANSURETHRAL RESECTION OF PROSTATE;  Surgeon: Royston Cowper, MD;  Location: [facility] ORS;  Service: Urology;  Laterality: N/A;  . HERNIA REPAIR     1980's  . PORTACATH PLACEMENT Left 06/29/2020   Procedure: INSERTION PORT-A-CATH;  Surgeon: [name], MD;  Location: [facility] ORS;  Service: Thoracic;  Laterality: Left;  . PULMONARY THROMBECTOMY N/A 07/22/2020    Procedure: PULMONARY THROMBECTOMY;  Surgeon: Algernon Huxley, MD;  Location: [facility];  Service: Cardiovascular;  Laterality: N/A;  . VIDEO ASSISTED THORACOSCOPY (VATS)/THOROCOTOMY Left 06/29/2020   Procedure: VIDEO ASSISTED THORACOSCOPY;  Surgeon: [name], MD;  Location: [facility] ORS;  Service: Thoracic;  Laterality: Left;    Family History  Problem Relation Age of Onset  . Alzheimer's disease Mother   . Sarcoidosis Sister   . Anxiety disorder Brother   . Kidney disease Neg Hx   . Prostate cancer Neg Hx     Allergies  Allergen Reactions  . Indomethacin  Swelling    ankle swelling  . Tamsulosin Hcl Hives  . Sulfa Antibiotics Rash    CBC Latest Ref Rng & Units 08/17/2020 08/14/2020 08/11/2020  WBC 4.0 - 10.5 K/uL 5.3 5.4 9.2  Hemoglobin 13.0 - 17.0 g/dL 8.5(L) 8.2(L) 8.7(L)  Hematocrit 39 - 52 % 26.1(L) 25.4(L) 25.3(L)  Platelets 150 - 400 K/uL 94(L) 121(L) 138(L)      CMP     Component Value Date/Time   NA 134 (L) 08/17/2020 0806   NA 138 06/17/2020 1316   K 3.7 08/17/2020 0806   CL 101 08/17/2020 0806   CO2 24 08/17/2020 0806   GLUCOSE 129 (H) 08/17/2020 0806   BUN 11 08/17/2020 0806   BUN 51 (H) 06/17/2020 1316   CREATININE 0.72 08/17/2020 0806   CALCIUM 9.3 08/17/2020 0806   PROT 7.4 08/03/2020 0824   PROT 6.2 06/17/2020 1316   ALBUMIN 3.4 (L) 08/03/2020 0824   ALBUMIN 3.4 (L) 06/17/2020 1316   AST 29 08/03/2020 0824   ALT 20 08/03/2020 0824   ALKPHOS 80 08/03/2020 0824   BILITOT 0.4 08/03/2020 0824   BILITOT 0.3 06/17/2020 1316   GFRNONAA >60 08/17/2020 0806   GFRAA 59 (L) 06/23/2020 0507     No results found.     Assessment & Plan:   1. 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  The patient's DVT and subsequent pulmonary embolism likely as a result  of his active cancer treatments.  Due to the fact that he is actively undergoing cancer treatment with recommended he remain on Eliquis throughout the duration of his treatment.  At which time he can work with hematology to determine if stopping Eliquis would be appropriate.  The patient is reminded to continue with conservative therapy such as wearing medical grade 1 compression socks elevation and activity as tolerable.  Otherwise, we will have the patient follow-up with [place] if they have issues.  2. Essential hypertension Continue antihypertensive medications as already ordered, these medications have been reviewed and there are no changes at this time.   3. Hyperlipidemia, unspecified hyperlipidemia type Continue statin as ordered and reviewed, no changes at this time    Current Outpatient Medications on File Prior to Visit  Medication Sig Dispense Refill  . acetaminophen (TYLENOL) 325 MG tablet Take 2 tablets (650 mg total) by mouth 3 (three) times daily. (Patient taking differently: Take 650 mg by mouth every 6 (six) hours as needed for moderate pain. ) 180 tablet 0  . apixaban (ELIQUIS) 5 MG TABS tablet Take 10mg  (2 tabs) twice daily for 7 days. Then on 8th day, take 5mg  (1 tab) twice daily. 60 tablet 2  . cholecalciferol (VITAMIN D3) 25 MCG (1000 UNIT) tablet Take 1,000 Units by mouth daily.    Marland Kitchen dutasteride (AVODART) 0.5 MG capsule Take 0.5 mg by mouth daily.    . feeding supplement (ENSURE ENLIVE / ENSURE PLUS) LIQD Take 237 mLs by mouth 2 (two) times daily between meals. 97989 mL 0  . folic acid (FOLVITE) 1 MG tablet Take 1 tablet (1 mg total) by mouth daily. 30 tablet 0  . lactulose (CHRONULAC) 10 GM/15ML solution Take 45 mLs (30 g total) by mouth 2 (two) times daily. 1992 mL 0  . lidocaine (LIDODERM) 5 % Place 1 patch onto the skin daily. Remove & Discard patch  within 12 hours or as directed by MD 30 patch 0  . Lidocaine (SALONPAS PAIN RELIEVING) 4 % PTCH Apply 1 patch topically every  morning. 30 patch 0  . loratadine (CLARITIN) 10 MG tablet Take 10 mg by mouth daily.    . megestrol (MEGACE) 40 MG/ML suspension Take 5 mLs (200 mg total) by mouth daily. 150 mL 1  . midodrine (PROAMATINE) 10 MG tablet Take 1 tablet (10 mg total) by mouth 4 (four) times daily. 120 tablet 0  . montelukast (SINGULAIR) 10 MG tablet TAKE 1 TABLET BY MOUTH EVERY DAY 90 tablet 3  . ondansetron (ZOFRAN) 8 MG tablet Take 1 tablet (8 mg total) by mouth 2 (two) times daily as needed for refractory nausea / vomiting. Start on day 3 after carboplatin chemo. 30 tablet 1  . oxyCODONE (OXYCONTIN) 15 mg 12 hr tablet Take 1 tablet (15 mg total) by mouth every 12 (twelve) hours. 30 tablet 0  . pantoprazole (PROTONIX) 20 MG tablet TAKE ONE TABLET BY MOUTH TWICE A DAY 60 tablet 5  . polyethylene glycol (MIRALAX / GLYCOLAX) 17 g packet Take 17 g by mouth daily as needed for moderate constipation. 30 each 0  . potassium chloride (KLOR-CON) 10 MEQ tablet Take 1 tablet (10 mEq total) by mouth 2 (two) times daily. 180 tablet 3  . prochlorperazine (COMPAZINE) 10 MG tablet Take 1 tablet (10 mg total) by mouth every 6 (six) hours as needed (Nausea or vomiting). 30 tablet 1  . rosuvastatin (CRESTOR) 10 MG tablet Take 1 tablet (10 mg total) by mouth daily. 90 tablet 3  . vitamin B-12 1000 MCG tablet Take 1 tablet (1,000 mcg total) by mouth daily. 180 tablet 1   Current Facility-Administered Medications on File Prior to Visit  Medication Dose Route Frequency Provider Last Rate Last Admin  . sodium chloride flush (NS) 0.9 % injection 10 mL  10 mL Intracatheter PRN Lequita Asal, MD   10 mL at 07/06/20 0808    There are no Patient Instructions on file for this visit. No follow-ups on file.   Kris Hartmann, NP

## 2020-08-20 ENCOUNTER — Telehealth: Payer: Self-pay | Admitting: Primary Care

## 2020-08-20 ENCOUNTER — Inpatient Hospital Stay: Payer: Medicare Other | Attending: Hematology and Oncology

## 2020-08-20 ENCOUNTER — Inpatient Hospital Stay: Payer: Medicare Other

## 2020-08-20 ENCOUNTER — Other Ambulatory Visit: Payer: Self-pay

## 2020-08-20 ENCOUNTER — Other Ambulatory Visit: Payer: Medicare Other

## 2020-08-20 ENCOUNTER — Ambulatory Visit
Admission: RE | Admit: 2020-08-20 | Discharge: 2020-08-20 | Disposition: A | Discharge status: Home / Self Care | Payer: Medicare Other | Source: Ambulatory Visit | Attending: Hematology and Oncology | Admitting: Hematology and Oncology

## 2020-08-20 ENCOUNTER — Ambulatory Visit: Payer: Medicare Other | Admitting: Hematology and Oncology

## 2020-08-20 ENCOUNTER — Other Ambulatory Visit: Payer: Self-pay | Admitting: Hematology and Oncology

## 2020-08-20 DIAGNOSIS — G893 Neoplasm related pain (acute) (chronic): Secondary | ICD-10-CM | POA: Diagnosis not present

## 2020-08-20 DIAGNOSIS — Z87891 Personal history of nicotine dependence: Secondary | ICD-10-CM | POA: Insufficient documentation

## 2020-08-20 DIAGNOSIS — Z79899 Other long term (current) drug therapy: Secondary | ICD-10-CM | POA: Insufficient documentation

## 2020-08-20 DIAGNOSIS — C3490 Malignant neoplasm of unspecified part of unspecified bronchus or lung: Secondary | ICD-10-CM

## 2020-08-20 DIAGNOSIS — D6481 Anemia due to antineoplastic chemotherapy: Secondary | ICD-10-CM | POA: Diagnosis present

## 2020-08-20 DIAGNOSIS — Z452 Encounter for adjustment and management of vascular access device: Secondary | ICD-10-CM | POA: Insufficient documentation

## 2020-08-20 DIAGNOSIS — C7951 Secondary malignant neoplasm of bone: Secondary | ICD-10-CM | POA: Diagnosis not present

## 2020-08-20 DIAGNOSIS — I959 Hypotension, unspecified: Secondary | ICD-10-CM

## 2020-08-20 LAB — COMPREHENSIVE METABOLIC PANEL
ALT: 12 U/L (ref 0–44)
AST: 26 U/L (ref 15–41)
Albumin: 3.6 g/dL (ref 3.5–5.0)
Alkaline Phosphatase: 62 U/L (ref 38–126)
Anion gap: 9 (ref 5–15)
BUN: 11 mg/dL (ref 8–23)
CO2: 24 mmol/L (ref 22–32)
Calcium: 9.1 mg/dL (ref 8.9–10.3)
Chloride: 98 mmol/L (ref 98–111)
Creatinine, Ser: 0.71 mg/dL (ref 0.61–1.24)
GFR, Estimated: >60 mL/min (ref >60)
Glucose, Bld: 100 mg/dL — ABNORMAL HIGH (ref 70–99)
Potassium: 3.9 mmol/L (ref 3.5–5.1)
Sodium: 131 mmol/L — ABNORMAL LOW (ref 135–145)
Total Bilirubin: 0.7 mg/dL (ref 0.3–1.2)
Total Protein: 7.3 g/dL (ref 6.5–8.1)

## 2020-08-20 LAB — CBC WITH DIFFERENTIAL/PLATELET
Abs Immature Granulocytes: 0.02 10*3/uL (ref 0.00–0.07)
Basophils Absolute: 0 10*3/uL (ref 0.0–0.1)
Basophils Relative: 0 %
Eosinophils Absolute: 0 10*3/uL (ref 0.0–0.5)
Eosinophils Relative: 1 %
HCT: 26.7 % — ABNORMAL LOW (ref 39.0–52.0)
Hemoglobin: 8.8 g/dL — ABNORMAL LOW (ref 13.0–17.0)
Immature Granulocytes: 1 %
Lymphocytes Relative: 28 %
Lymphs Abs: 1 10*3/uL (ref 0.7–4.0)
MCH: 28.9 pg (ref 26.0–34.0)
MCHC: 33 g/dL (ref 30.0–36.0)
MCV: 87.8 fL (ref 80.0–100.0)
Monocytes Absolute: 0.4 10*3/uL (ref 0.1–1.0)
Monocytes Relative: 13 %
Neutro Abs: 2.1 10*3/uL (ref 1.7–7.7)
Neutrophils Relative %: 57 %
Platelets: 130 10*3/uL — ABNORMAL LOW (ref 150–400)
RBC: 3.04 MIL/uL — ABNORMAL LOW (ref 4.22–5.81)
RDW: 16.2 % — ABNORMAL HIGH (ref 11.5–15.5)
WBC: 3.5 10*3/uL — ABNORMAL LOW (ref 4.0–10.5)
nRBC: 0 % (ref 0.0–0.2)

## 2020-08-20 LAB — MAGNESIUM: Magnesium: 1.6 mg/dL — ABNORMAL LOW (ref 1.7–2.4)

## 2020-08-20 MED ORDER — SODIUM CHLORIDE 0.9 % IV SOLN
Freq: Once | INTRAVENOUS | Status: AC
  Filled 2020-08-20: qty 250

## 2020-08-20 MED ORDER — EPOETIN ALFA-EPBX 10000 UNIT/ML IJ SOLN
10000.0000 [IU] | Freq: Once | INTRAMUSCULAR | Status: AC
  Administered 2020-08-20: 10000 [IU] via SUBCUTANEOUS
  Filled 2020-08-20: qty 1

## 2020-08-20 MED ORDER — HEPARIN SOD (PORK) LOCK FLUSH 100 UNIT/ML IV SOLN
500.0000 [IU] | Freq: Once | INTRAVENOUS | Status: AC | PRN
  Administered 2020-08-20: 500 [IU]
  Filled 2020-08-20: qty 5

## 2020-08-20 MED ORDER — SODIUM CHLORIDE 0.9% FLUSH
10.0000 mL | Freq: Once | INTRAVENOUS | Status: AC | PRN
  Administered 2020-08-20: 10 mL
  Filled 2020-08-20: qty 10

## 2020-08-20 MED ORDER — SODIUM CHLORIDE 0.9 % IV SOLN
Freq: Once | INTRAVENOUS | Status: DC
  Filled 2020-08-20: qty 250

## 2020-08-20 MED ORDER — IOHEXOL 300 MG/ML  SOLN
100.0000 mL | Freq: Once | INTRAMUSCULAR | Status: AC | PRN
  Administered 2020-08-20: 100 mL via INTRAVENOUS

## 2020-08-20 NOTE — Telephone Encounter (Signed)
Spoke with patient's wife regarding Palliative referral/services and she has requested to speak with patient about this in a little more detail to see if he was in agreement with starting Palliative services.  I told wife that I would email her our Palliative Brochure and I would call her back on 08/27/20 to f/u with them to see what he has decided to do and she was in agreement with this.

## 2020-08-22 NOTE — Progress Notes (Signed)
[facility]  [address], [address] [place], [place] [address] Phone: [phone]  Fax: [phone]   Office Visit:  08/24/20  Referring physician: Jerrol Banana.,*  Chief Complaint: Dwayne Thompson is a 74 y.o. male with extensive stage small cell lung cancer who is seen for review of interval restaging studies and consideration of day 1 of cycle #3 carboplatin, etoposide, and Tecentriq.  HPI:  The patient was last seen in the medical oncology clinic on 08/17/2020. At that time, he felt "fine".  He was not eating much.  Pleurx catheter was draining very little.  He was taking oxycodone 4x/day.  Hematocrit was 26.1, hemoglobin 8.5, MCV 87.6, platelets 94,000, WBC 5,300. Sodium was 134.  Labs on 08/20/2020 revealed a hematocrit of 26.7, hemoglobin 8.8,  MCV 87.8, platelets 130,000, WBC 3,500 (ANC 2,100). Sodium was 131. Magnesium was 1.6. He received Retacrit 10,000 units. He was instructed to begin magnesium oxide 400 mg a day.  Chest, abdomen, and pelvis CT with contrast on 08/20/2020 revealed widespread osseous metastatic disease increased from prior PET-CT of 06/12/2020, and with numerous new sclerotic lesions compared to the CT angiogram of the chest from 07/22/2020. There was similar thickness of the dense pleural rind of tumor on the left, with similar consolidation and tumor in the left lower lobe infrahilar region with surrounding airspace opacities. There were multiple locations of chest wall invasion on the left. There was substantial worsening of upper abdominal metastatic disease including in the retroperitoneum, along the left hemidiaphragmatic crus, and along the left psoas muscle. There was stable mild mediastinal adenopathy. There was aortic atherosclerosis and coronary atherosclerosis. 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 There was severe emphysema.  During the interim, he has been "ok". He takes oxycodone at breakfast, lunch, dinner, and bedtime.  His pain is usually at a 5 but yesterday it was a 7. The long-acting pain medication has not come in yet.  The patient still has shortness of breath on exertion. He does not exert himself very often. He is sitting most of the day. He needs help with some ADLs. He is able to wash most of his body on his own. He also reports constipation and nausea.  The patient's wife notes that he was uncooperative on Saturday and refused to eat. He missed one dose of his pain medication. He states that he felt better yesterday.  We discussed the results of his PET scan and his options including Hospice/palliative care vs topotecan vs lurbinectedin. The patient stated "he's not giving up." He is hesitant to pursue Hospice care because he feels that he would be giving up. He will discuss this further with his wife before making a decision.   Past Medical History:  Diagnosis Date  . Anemia   . Back pain   . BPH (benign prostatic hyperplasia)   . Cirrhosis ([place])   . COPD (chronic obstructive pulmonary disease) ([place])   . ED (erectile dysfunction)   . GERD (gastroesophageal reflux disease)   . Hyperlipidemia   . Hypertension    Past Surgical History:  Procedure Laterality Date  . COLONOSCOPY Left 01/03/2018   Procedure: COLONOSCOPY;  Surgeon: Virgel Manifold, MD;  Location: [facility] ENDOSCOPY;  Service: Endoscopy;  Laterality: Left;  . COLONOSCOPY WITH PROPOFOL N/A 04/05/2018   Procedure: COLONOSCOPY WITH PROPOFOL;  Surgeon: Virgel Manifold, MD;  Location: [facility] ENDOSCOPY;  Service: Endoscopy;  Laterality: N/A;  . CYSTOSCOPY WITH STENT PLACEMENT Right 09/26/2019   Procedure: CYSTOSCOPY WITH STENT  PLACEMENT;  Surgeon: Royston Cowper, MD;  Location: [facility] ORS;  Service: Urology;  Laterality: Right;  . ENTEROSCOPY  01/03/2018   Procedure: ENTEROSCOPY;  Surgeon: Virgel Manifold, MD;  Location: [facility] ENDOSCOPY;  Service: Endoscopy;;  . ESOPHAGOGASTRODUODENOSCOPY N/A 01/02/2018   Procedure:  ESOPHAGOGASTRODUODENOSCOPY (EGD);  Surgeon: Virgel Manifold, MD;  Location: [facility] ENDOSCOPY;  Service: Endoscopy;  Laterality: N/A;  . ESOPHAGOGASTRODUODENOSCOPY (EGD) WITH PROPOFOL N/A 04/05/2018   Procedure: ESOPHAGOGASTRODUODENOSCOPY (EGD) WITH PROPOFOL;  Surgeon: Virgel Manifold, MD;  Location: [facility] ENDOSCOPY;  Service: Endoscopy;  Laterality: N/A;  . exploration and neurolysis of right peroneal nerve at the knee Right 08/27/2018  . GREEN LIGHT LASER TURP (TRANSURETHRAL RESECTION OF PROSTATE N/A 09/26/2019   Procedure: GREEN LIGHT LASER TURP (TRANSURETHRAL RESECTION OF PROSTATE;  Surgeon: Royston Cowper, MD;  Location: [facility] ORS;  Service: Urology;  Laterality: N/A;  . HERNIA REPAIR     1980's  . PORTACATH PLACEMENT Left 06/29/2020   Procedure: INSERTION PORT-A-CATH;  Surgeon: [name], MD;  Location: [facility] ORS;  Service: Thoracic;  Laterality: Left;  . PULMONARY THROMBECTOMY N/A 07/22/2020   Procedure: PULMONARY THROMBECTOMY;  Surgeon: Algernon Huxley, MD;  Location: [facility];  Service: Cardiovascular;  Laterality: N/A;  . VIDEO ASSISTED THORACOSCOPY (VATS)/THOROCOTOMY Left 06/29/2020   Procedure: VIDEO ASSISTED THORACOSCOPY;  Surgeon: [name], MD;  Location: [facility] ORS;  Service: Thoracic;  Laterality: Left;   Family History  Problem Relation Age of Onset  . Alzheimer's disease Mother   . Sarcoidosis Sister   . Anxiety disorder Brother   . Kidney disease Neg Hx   . Prostate cancer Neg Hx     Social History: reports that he quit smoking about 6 years ago. His smoking use included cigarettes. He has a 50.00 pack-year smoking history. He has never used smokeless tobacco. He reports current alcohol use of about 21.0 - 28.0 standard drinks of alcohol per week. He reports that he does not use drugs. He drinks 2-3 alcohol shots/day. His wife's name isJanice and granddaughter's name isAriel. The patient is accompanied by his wife today.  Allergies:  Allergies   Allergen Reactions  . Indomethacin Swelling    ankle swelling  . Tamsulosin Hcl Hives  . Sulfa Antibiotics Rash   Current Outpatient Medications  Medication Instructions  . acetaminophen (TYLENOL) 650 mg, Oral, 3 times daily  . apixaban (ELIQUIS) 5 MG TABS tablet Take 10mg  (2 tabs) twice daily for 7 days. Then on 8th day, take 5mg  (1 tab) twice daily.  . cholecalciferol (VITAMIN D3) 1,000 Units, Oral, Daily  . cyanocobalamin 1,000 mcg, Oral, Daily  . dutasteride (AVODART) 0.5 mg, Oral, Daily  . feeding supplement (ENSURE ENLIVE / ENSURE PLUS) LIQD 237 mLs, Oral, 2 times daily between meals  . folic acid (FOLVITE) 1 mg, Oral, Daily  . lactulose (CHRONULAC) 30 g, Oral, 2 times daily  . lidocaine (LIDODERM) 5 % 1 patch, Transdermal, Every 24 hours, Remove & Discard patch within 12 hours or as directed by MD  . Lidocaine (SALONPAS PAIN RELIEVING) 4 % PTCH 1 patch, Apply externally,  Every morning - 10a  . loratadine (CLARITIN) 10 mg, Oral, Daily  . magnesium 400 mg, Oral, Daily  . megestrol (MEGACE) 200 mg, Oral, Daily  . midodrine (PROAMATINE) 10 mg, Oral, 4 times daily  . montelukast (SINGULAIR) 10 MG tablet TAKE 1 TABLET BY MOUTH EVERY DAY  . ondansetron (ZOFRAN) 8 mg, Oral, 2 times daily PRN, Start on day 3 after carboplatin  chemo.  . oxyCODONE (ROXICODONE) 15 mg, Oral, Every 4 hours PRN  . oxyCODONE ER (XTAMPZA ER) 13.5 MG C12A 1 capsule, Oral, Every 12 hours  . pantoprazole (PROTONIX) 20 MG tablet TAKE ONE TABLET BY MOUTH TWICE A DAY  . polyethylene glycol (MIRALAX / GLYCOLAX) 17 g, Oral, Daily PRN  . potassium chloride (KLOR-CON) 10 MEQ tablet 10 mEq, Oral, 2 times daily  . prochlorperazine (COMPAZINE) 10 mg, Oral, Every 6 hours PRN  . rosuvastatin (CRESTOR) 10 mg, Oral, Daily    Review of Systems  Constitutional: Negative for chills, diaphoresis, fever, malaise/fatigue and weight loss (up 4 lbs since 08/12/2020).       Feels "okay."  HENT: Negative for congestion, ear  discharge, ear pain, hearing loss, nosebleeds, sinus pain, sore throat and tinnitus.   Eyes: Negative for blurred vision.  Respiratory: Positive for shortness of breath (on exertion). Negative for cough, hemoptysis and sputum production.        On 3 liters/min oxygen.  Cardiovascular: Negative for chest pain, palpitations and leg swelling.  Gastrointestinal: Positive for constipation (takes Miralax) and nausea. Negative for abdominal pain, blood in stool, diarrhea, heartburn, melena and vomiting.  Genitourinary: Negative for dysuria, frequency, hematuria and urgency.  Musculoskeletal: Negative for back pain, joint pain, myalgias and neck pain.  Skin: Negative for itching and rash.  Neurological: Negative for dizziness, tingling, sensory change, weakness and headaches.  Endo/Heme/Allergies: Does not bruise/bleed easily.  Psychiatric/Behavioral: Negative for depression and memory loss. The patient is not nervous/anxious and does not have insomnia.   All other systems reviewed and are negative.   Performance status:  3   Vital Signs Blood pressure 100/68, pulse 94, temperature 97.6 F (36.4 C), temperature source Tympanic, resp. rate 18, weight 165 lb 0.2 oz (74.9 kg), SpO2 100 %.  Physical Exam Vitals and nursing note reviewed.  Constitutional:      General: He is not in acute distress.    Appearance: He is not diaphoretic.  HENT:     Head: Normocephalic and atraumatic.     Comments:  Short graying hair    Mouth/Throat:     Mouth: Mucous membranes are moist.     Pharynx: Oropharynx is clear.  Eyes:     General: No scleral icterus.    Extraocular Movements: Extraocular movements intact.     Conjunctiva/sclera: Conjunctivae normal.     Pupils: Pupils are equal, round, and reactive to light.     Comments: Glasses. Brown eyes.   Cardiovascular:     Rate and Rhythm: Normal rate and regular rhythm.     Heart sounds: Normal heart sounds. No murmur heard.   Pulmonary:     Effort:  Pulmonary effort is normal. No respiratory distress.     Breath sounds: Normal breath sounds. No wheezing or rales.  Chest:     Chest wall: No tenderness.  Abdominal:     General: Bowel sounds are normal. There is no distension.     Palpations: Abdomen is soft. There is no mass.     Tenderness: There is no abdominal tenderness. There is no guarding or rebound.  Musculoskeletal:        General: No swelling or tenderness. Normal range of motion.     Cervical back: Normal range of motion and neck supple.     Right lower leg: Edema (trace) present.     Left lower leg: Edema (trace) present.  Lymphadenopathy:     Head:     Right side of head:  No preauricular, posterior auricular or occipital adenopathy.     Left side of head: No preauricular, posterior auricular or occipital adenopathy.     Cervical: No cervical adenopathy.     Upper Body:     Right upper body: No supraclavicular or axillary adenopathy.     Left upper body: No supraclavicular or axillary adenopathy.     Lower Body: No right inguinal adenopathy. No left inguinal adenopathy.  Skin:    General: Skin is warm and dry.  Neurological:     Mental Status: He is alert and oriented to person, place, and time.  Psychiatric:        Behavior: Behavior normal.        Thought Content: Thought content normal.        Judgment: Judgment normal.    No results found for this or any previous visit (from the past 48 hour(s)). No results found.  Assessment:  Dwayne Thompson is a 74 y.o. male with extensive stage small cell lung cancer s/p left chest wall/pleural biopsy on 06/24/2020.  He presented with left hemothorax. Thoracentesis x2 (05/20/2020 and 06/05/2020) revealed bloody fluid with no evidence of malignancy.  He has a chest tube in place.  Chest CT on 06/05/2020 revealed pleural thickening, nodularity and pleural-based mass with central adenopathy versus mass suspicious for pulmonary neoplasm with extensive pleural metastasis.   Mesothelioma is in the differential.  There was increasing left subcarinal lymph node use into the left mainstem bronchus.  There was increasing upper abdominal lymph nodes.  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  There was accentuated activity along the consolidative and atelectatic left lower lobe probably from tumor within the lung but potentially from tumor along the visceral pleura.  There was hypermetabolic thoracic adenopathy along with hypermetabolic upper abdominal adenopathy and hypermetabolic skeletal metastatic lesions (right iliac bone SUV 11.9 and T1). There was possible early tumor extension into the left T11-12 neuroforamen with some erosion of the adjacent T11 vertebral body.  Thoracic spine MRI with and without contrast on 06/28/2020 revealed a left paraspinous soft tissue mass extending from T9-10 through T12-L1, with invasion/involvement of the adjacent T10 and T11 vertebral bodies. Tumor partially extended into the left neural foramen at T11-12. There was no other significant extension into the spinal canal or epidural tumor. There were additional metastatic lesions involving the T1 and T10 vertebral bodies as well as the right pedicle of T6. There was no associated pathologic fracture. There was a heterogeneous and partially loculated left pleural effusion with consolidation signal intensity throughout the visualized left lower lung.  Brain MRI with and without contrast on 06/28/2020 revealed no evidence for intracranial metastatic disease. There was mild to moderate chronic microvascular ischemic disease.  He is s/p 2 cycles of carboplatin, etoposide and Tecentriq with Neulasta support (07/06/2020-08/03/2020).  Chest, abdomen, and pelvis CT with contrast on 08/20/2020 revealed widespread osseous metastatic disease increased from prior PET-CT of 06/12/2020, and with numerous new  sclerotic lesions compared to the CT angiogram of the chest from 07/22/2020. There was similar thickness of the dense pleural rind of tumor on the left, with similar consolidation and tumor in the left lower lobe infrahilar region with surrounding airspace opacities. There were multiple locations of chest wall invasion on the left. There was substantial worsening of upper abdominal metastatic disease including in the retroperitoneum, along the left hemidiaphragmatic crus, and along the left psoas muscle. There  was stable mild mediastinal adenopathy. 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 There was severe emphysema.  He was admitted to [facility] from 07/21/2020 - 07/25/2020 with worsening shortness of breath x a few days. Bilateral lower extremity venous duplex showed bilateral DVT involving the popliteal and calf veins. Chest CT angiogram showed pulmonary emboli within all lobes of the right lung. He underwent mechanical thrombectomy of the right pulmonary arteries. He was started on heparin and then transitioned to Eliquis. He was also started on vancomycin and Zosyn for postobstructive pneumonia and was de-escalated to Augmentin on discharge. He was discharged on 3 liters/min nasal cannula oxygen.  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 He previously alcohol daily.  Symptomatically, he feels "ok". He takes oxycodone 4x/day. Long-acting pain medication has not come in yet.  He has shortness of breath on exertion. He does not exert himself very often. He is sitting most of the day. He needs help with some ADLs. He reports constipation and nausea.  Plan:   1.   Labs today: CBC with diff, CMP, Mg, TSH. 2.   Extensive stage small cell lung cancer PET scan on 06/12/2020 revealed pleural based disease, hypermetabolic adenopathy, and skeletal metastasis.             Head MRI revealed no evidence of metastatic disase.             He is day 22 s/p cycle #2  carboplatin, etoposide and Tecentriq with Neulasta support (08/03/2020).                         Clinically he is doing fair.             Chest, abdomen, and pelvis CT on 08/20/2020 was personally reviewed with radiology.  Agree with radiology interpretation.   He has a mixed response.                Discuss patient's thoughts about therapy.   Previously, he was considering no therapy if progressive on front line treatment.   He is considering 2nd line treatment today.  Discuss low response rates for primary refractory disease.   Discuss topotecan (weekly vs 5 days every 3 weeks) vs lurbinectedin every 3 weeks.      Potential side effects reviewed; information provided.  Patient states that he will discuss further with his wife.  Several questions asked and answered. 3.   Recurrent pleural effusion             Pleurx catheter remains in place.             Output remains minimal. 4. Normocytic anemia Hematocrit 27.1.  Hemoglobin 8.8.  MCV 88.9 today.                         Ferritin 632 with an iron saturation of 75% (low) and TIBC 279 on 08/06/2020.             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             Continue to monitor. 5.Leukopenia  WBC 2900 with ANC 1400.  Etiology secondary to chemotherapy.  Neutropenic precautions reviewed.  Anticipate counts to recover soon (increased monocyte count). 6.   Cancer-related pain Pain appears fairly well controlled.  He is taking oxycodone 4x/day.  Long acting pain medication has not arrived. 7.   Hyponatremia, improved             Sodium 133 today.             Encourage fluids with electrolytes. 8.   Code status  Discuss code status.  Discuss Hospice and supportive care. 9.   No chemotherapy. 10.   Patient considering Hospice or alternative chemotherapy. 11.   Patient to call back regarding  treatment decision.  I discussed the assessment and treatment plan with the patient.  The patient was provided an opportunity to ask questions and all were answered.  The patient agreed with the plan and demonstrated an understanding of the instructions.  The patient was advised to call back or seek an in person evaluation if the symptoms worsen or if the condition fails to improve as anticipated.  I provided 28 minutes of face-to-face time during this this encounter and > 50% was spent counseling as documented under my assessment and plan.  An additional 10 minutes were spent reviewing his chart (Epic and Care Everywhere) including notes, labs, and imaging studies.   I spoke with radiology twice regarding his imaging studies.   Nolon Stalls, MD, PhD  08/24/2020, 2:21 PM   I, De Burrs, am acting as a scribe for Lequita Asal, MD.  I, Baldwin Mike Gip, MD, have reviewed the above documentation for accuracy and completeness, and I agree with the above.

## 2020-08-24 ENCOUNTER — Other Ambulatory Visit: Payer: Self-pay | Admitting: Hospice and Palliative Medicine

## 2020-08-24 ENCOUNTER — Encounter: Payer: Self-pay | Admitting: Hematology and Oncology

## 2020-08-24 ENCOUNTER — Inpatient Hospital Stay: Payer: Medicare Other

## 2020-08-24 ENCOUNTER — Other Ambulatory Visit: Payer: Self-pay

## 2020-08-24 ENCOUNTER — Inpatient Hospital Stay (HOSPITAL_BASED_OUTPATIENT_CLINIC_OR_DEPARTMENT_OTHER): Payer: Medicare Other | Admitting: Hematology and Oncology

## 2020-08-24 VITALS — BP 100/68 | HR 94 | Temp 97.6°F | Resp 18 | Wt 165.0 lb

## 2020-08-24 DIAGNOSIS — D649 Anemia, unspecified: Secondary | ICD-10-CM

## 2020-08-24 DIAGNOSIS — C7951 Secondary malignant neoplasm of bone: Secondary | ICD-10-CM | POA: Diagnosis not present

## 2020-08-24 DIAGNOSIS — C3490 Malignant neoplasm of unspecified part of unspecified bronchus or lung: Secondary | ICD-10-CM

## 2020-08-24 DIAGNOSIS — G893 Neoplasm related pain (acute) (chronic): Secondary | ICD-10-CM | POA: Diagnosis not present

## 2020-08-24 DIAGNOSIS — E538 Deficiency of other specified B group vitamins: Secondary | ICD-10-CM

## 2020-08-24 DIAGNOSIS — Z452 Encounter for adjustment and management of vascular access device: Secondary | ICD-10-CM | POA: Diagnosis not present

## 2020-08-24 DIAGNOSIS — Z95828 Presence of other vascular implants and grafts: Secondary | ICD-10-CM

## 2020-08-24 DIAGNOSIS — Z7189 Other specified counseling: Secondary | ICD-10-CM

## 2020-08-24 DIAGNOSIS — T451X5A Adverse effect of antineoplastic and immunosuppressive drugs, initial encounter: Secondary | ICD-10-CM

## 2020-08-24 DIAGNOSIS — D701 Agranulocytosis secondary to cancer chemotherapy: Secondary | ICD-10-CM

## 2020-08-24 DIAGNOSIS — E871 Hypo-osmolality and hyponatremia: Secondary | ICD-10-CM

## 2020-08-24 LAB — CBC WITH DIFFERENTIAL/PLATELET
Abs Immature Granulocytes: 0.02 10*3/uL (ref 0.00–0.07)
Basophils Absolute: 0 10*3/uL (ref 0.0–0.1)
Basophils Relative: 0 %
Eosinophils Absolute: 0 10*3/uL (ref 0.0–0.5)
Eosinophils Relative: 1 %
HCT: 27.1 % — ABNORMAL LOW (ref 39.0–52.0)
Hemoglobin: 8.8 g/dL — ABNORMAL LOW (ref 13.0–17.0)
Immature Granulocytes: 1 %
Lymphocytes Relative: 31 %
Lymphs Abs: 0.9 10*3/uL (ref 0.7–4.0)
MCH: 28.9 pg (ref 26.0–34.0)
MCHC: 32.5 g/dL (ref 30.0–36.0)
MCV: 88.9 fL (ref 80.0–100.0)
Monocytes Absolute: 0.6 10*3/uL (ref 0.1–1.0)
Monocytes Relative: 19 %
Neutro Abs: 1.4 10*3/uL — ABNORMAL LOW (ref 1.7–7.7)
Neutrophils Relative %: 48 %
Platelets: 191 10*3/uL (ref 150–400)
RBC: 3.05 MIL/uL — ABNORMAL LOW (ref 4.22–5.81)
RDW: 16.6 % — ABNORMAL HIGH (ref 11.5–15.5)
WBC: 2.9 10*3/uL — ABNORMAL LOW (ref 4.0–10.5)
nRBC: 0 % (ref 0.0–0.2)

## 2020-08-24 LAB — COMPREHENSIVE METABOLIC PANEL
ALT: 12 U/L (ref 0–44)
AST: 24 U/L (ref 15–41)
Albumin: 3.5 g/dL (ref 3.5–5.0)
Alkaline Phosphatase: 66 U/L (ref 38–126)
Anion gap: 9 (ref 5–15)
BUN: 11 mg/dL (ref 8–23)
CO2: 24 mmol/L (ref 22–32)
Calcium: 8.9 mg/dL (ref 8.9–10.3)
Chloride: 100 mmol/L (ref 98–111)
Creatinine, Ser: 0.79 mg/dL (ref 0.61–1.24)
GFR, Estimated: >60 mL/min (ref >60)
Glucose, Bld: 115 mg/dL — ABNORMAL HIGH (ref 70–99)
Potassium: 3.9 mmol/L (ref 3.5–5.1)
Sodium: 133 mmol/L — ABNORMAL LOW (ref 135–145)
Total Bilirubin: 0.5 mg/dL (ref 0.3–1.2)
Total Protein: 6.8 g/dL (ref 6.5–8.1)

## 2020-08-24 LAB — TSH: TSH: 0.708 u[IU]/mL (ref 0.350–4.500)

## 2020-08-24 LAB — MAGNESIUM: Magnesium: 1.7 mg/dL (ref 1.7–2.4)

## 2020-08-24 MED ORDER — SODIUM CHLORIDE 0.9% FLUSH
10.0000 mL | Freq: Once | INTRAVENOUS | Status: AC
  Administered 2020-08-24: 10 mL via INTRAVENOUS
  Filled 2020-08-24: qty 10

## 2020-08-24 MED ORDER — HEPARIN SOD (PORK) LOCK FLUSH 100 UNIT/ML IV SOLN
500.0000 [IU] | Freq: Once | INTRAVENOUS | Status: AC
  Filled 2020-08-24: qty 5

## 2020-08-24 MED ORDER — XTAMPZA ER 13.5 MG PO C12A: 1.0000 | EXTENDED_RELEASE_CAPSULE | Freq: Two times a day (BID) | ORAL | 0 refills | Status: DC

## 2020-08-24 NOTE — Patient Instructions (Signed)
Topotecan injection What is this medicine? TOPOTECAN (TOE poe TEE kan) is a chemotherapy drug. It is used to treat lung cancer, ovarian cancer, and cervical cancer. This medicine may be used for other purposes; ask your health care provider or pharmacist if you have questions. COMMON BRAND NAME(S): Hycamtin What should I tell my health care provider before I take this medicine? They need to know if you have any of these conditions:  immune system problems  infection (especially a virus infection such as chickenpox, cold sores, or herpes)  kidney disease  low blood counts, like low white cell, platelet, or red cell counts  lung or breathing disease, like asthma  scarring or thickening of the lungs  an unusual or allergic reaction to topotecan, other medicines, foods, dyes, or preservatives  pregnant or trying to get pregnant  breast-feeding How should I use this medicine? This medicine is for infusion into a vein. It is usually given by a health care professional in a hospital or clinic setting. Talk to your pediatrician regarding the use of this medicine in children. Special care may be needed. Overdosage: If you think you have taken too much of this medicine contact a poison control center or emergency room at once. NOTE: This medicine is only for you. Do not share this medicine with others. What if I miss a dose? It is important not to miss your dose. Call your doctor or health care professional if you are unable to keep an appointment. What may interact with this medicine?  amiodarone  azithromycin  captopril  carvedilol  certain medications for fungal infections like ketoconazole and itraconazole  clarithromycin  conivaptan  cyclosporine  dronedarone  eltrombopag  erythromycin  felodipine  grapefruit juice  lopinavir  quercetin  quinidine  ranolazine  ritonavir  ticagrelor  verapamil This list may not describe all possible interactions.  Give your health care provider a list of all the medicines, herbs, non-prescription drugs, or dietary supplements you use. Also tell them if you smoke, drink alcohol, or use illegal drugs. Some items may interact with your medicine. What should I watch for while using this medicine? This drug may make you feel generally unwell. This is not uncommon, as chemotherapy can affect healthy cells as well as cancer cells. Report any side effects. Continue your course of treatment even though you feel ill unless your doctor tells you to stop. Call your doctor or health care professional for advice if you get a fever, chills or sore throat, or other symptoms of a cold or flu. Do not treat yourself. This drug decreases your body's ability to fight infections. Try to avoid being around people who are sick. This medicine may increase your risk to bruise or bleed. Call your doctor or health care professional if you notice any unusual bleeding. Be careful brushing and flossing your teeth or using a toothpick because you may get an infection or bleed more easily. If you have any dental work done, tell your dentist you are receiving this medicine. Avoid taking products that contain aspirin, acetaminophen, ibuprofen, naproxen, or ketoprofen unless instructed by your doctor. These medicines may hide a fever. Do not become pregnant while taking this medicine or for 6 months after stopping it. Women should inform their doctor if they wish to become pregnant or think they might be pregnant. Men should not father a child while taking this medicine and for 3 months after stopping it. There is a potential for serious side effects to an unborn child.  Talk to your health care professional or pharmacist for more information. Do not breast-feed an infant while taking this medicine. In females, this medicine may make it more difficult to get pregnant. This medicine has also caused reduced sperm counts in some men. This may interfere with  the ability to father a child. You should talk to your doctor or health care professional if you are concerned about your fertility. What side effects may I notice from receiving this medicine? Side effects that you should report to your doctor or health care professional as soon as possible:  allergic reactions like skin rash, itching or hives, swelling of the face, lips, or tongue  breathing difficulties  diarrhea  signs of decreased platelets or bleeding - bruising, pinpoint red spots on the skin, black, tarry stools, blood in the urine  signs of decreased red blood cells - unusually weak or tired, feeling faint or lightheaded, falls  signs and symptoms of infection like fever or chills; cough; sore throat; pain or trouble passing urine Side effects that usually do not require medical attention (report to your doctor or health care professional if they continue or are bothersome):  hair loss  headache  loss of appetite  nausea, vomiting  stomach pain This list may not describe all possible side effects. Call your doctor for medical advice about side effects. You may report side effects to FDA at 1-800-FDA-1088. Where should I keep my medicine? Keep out of the reach of children. This drug is usually given in a hospital or clinic and will not be stored at home. In rare cases, this medicine may be given at home. If you are using this medicine at home, you will be instructed on how to store this medicine. Throw away any unused medicine after the expiration date on the label. NOTE: This sheet is a summary. It may not cover all possible information. If you have questions about this medicine, talk to your doctor, pharmacist, or health care provider.  2020 Elsevier/Gold Standard (2017-06-01 11:35:12)   Lurbinectedin Injection What is this medicine? LURBINECTEDIN (LOOR bin EK te din) is a chemotherapy drug. This medicine is used to treat lung cancer. This medicine may be used for  other purposes; ask your health care provider or pharmacist if you have questions. COMMON BRAND NAME(S): ZEPZELCA What should I tell my health care provider before I take this medicine? They need to know if you have any of these conditions:  infection (especially a viral infection such as chickenpox, cold sores, or herpes)  liver disease  low blood counts, like white cells, platelets, or red blood cells  an unusual or allergic reaction to lurbinectedin, other medicines, foods, dyes or preservatives  pregnant or trying to get pregnant  breast-feeding How should I use this medicine? This medicine is for infusion into a vein. It is given by a healthcare professional in a hospital or clinic setting. Talk to your pediatrician about the use of this medicine in children. Special care may be needed. Overdosage: If you think you have taken too much of this medicine contact a poison control center or emergency room at once. NOTE: This medicine is only for you. Do not share this medicine with others. What if I miss a dose? Keep appointments for follow-up doses. It is important not to miss your dose. Call your doctor or health care professional if you are unable to keep an appointment. What may interact with this medicine?  certain antibiotics like erythromycin or clarithromycin  certain  antivirals for HIV or hepatitis  certain medicines for fungal infections like ketoconazole, itraconazole, or posaconazole  certain medicines for seizures like carbamazepine, phenobarbital, phenytoin  grapefruit or grapefruit juice  St. John's Wort This list may not describe all possible interactions. Give your health care provider a list of all the medicines, herbs, non-prescription drugs, or dietary supplements you use. Also tell them if you smoke, drink alcohol, or use illegal drugs. Some items may interact with your medicine. What should I watch for while using this medicine? Your condition will be  monitored carefully while you are receiving this medicine. Do not become pregnant while taking this medicine or for 6 months after stopping it. Women should inform their health care professional if they wish to become pregnant or think they might be pregnant. Men should not father a child while taking this medicine and for 4 months after stopping it. There is potential for serious side effects to an unborn child. Talk to your health care professional for more information. Do not breast-feed a child while taking this medicine or for 2 weeks after stopping it. Call your health care professional for advice if you get a fever, chills, or sore throat, or other symptoms of a cold or flu. Do not treat yourself. This medicine decreases your body's ability to fight infections. Try to avoid being around people who are sick. Avoid taking medicines that contain aspirin, acetaminophen, ibuprofen, naproxen, or ketoprofen unless instructed by your health care professional. These medicines may hide a fever. Be careful brushing or flossing your teeth or using a toothpick because you may get an infection or bleed more easily. If you have any dental work done, tell your dentist you are receiving this medicine. What side effects may I notice from receiving this medicine? Side effects that you should report to your doctor or health care professional as soon as possible:  allergic reactions like skin rash, itching or hives; swelling of the face, lips, or tongue  chest pain  nausea, vomiting  signs and symptoms of bleeding such as bloody or black, tarry stools; red or dark-brown urine; spitting up blood or brown material that looks like coffee grounds; red spots on the skin; unusual bruising or bleeding from the eyes, gums, or nose  signs and symptoms of infection like fever; chills; cough; sore throat; pain or trouble passing urine  signs and symptoms of liver injury like dark yellow or brown urine; general ill feeling  or flu-like symptoms; light-colored stools; loss of appetite; nausea; right upper belly pain; unusually weak or tired; yellowing of the eyes or skin Side effects that usually do not require medical attention (report these to your doctor or health care professional if they continue or are bothersome):  changes in taste  constipation  diarrhea  loss of appetite  muscle pain  pain, tingling, numbness in the hands or feet  signs and symptoms of high blood sugar such as being more thirsty or hungry or having to urinate more than normal. You may also feel very tired or have blurry vision.  signs and symptoms of low magnesium like muscle cramps; muscle pain; muscle weakness; tremors; seizures; or fast, irregular heartbeat  signs and symptoms of low red blood cells or anemia such as unusually weak or tired; feeling faint or lightheaded; falls; breathing problems  stomach pain This list may not describe all possible side effects. Call your doctor for medical advice about side effects. You may report side effects to FDA at 1-800-FDA-1088. Where should  I keep my medicine? This medicine is given in a hospital or clinic and will not be stored at home. NOTE: This sheet is a summary. It may not cover all possible information. If you have questions about this medicine, talk to your doctor, pharmacist, or health care provider.  2020 Elsevier/Gold Standard (2019-03-07 13:00:33)

## 2020-08-24 NOTE — Progress Notes (Signed)
Nutrition Assessment:  Referral for weight loss.   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  Past medical history of cirrhosis, GERD, COPD, HLD, HTN.  Patient receiving carboplatin, etoposide, atezolizumab.   Spoke with patient via phone.  Patient reports that his appetite is decreased and taste are off.  Reports that he drinks 3-4 ensure per day (unsure varitey).  Reports yesterday ate toast with pears and coffee. Lunch was banana and mayo sandwich.  Supper last night was tossed salad.  Reports some issue with nausea.       Medications: zofran, compazine, megace, protonix, KCL, folic acid, lactulose  Labs: reviewed  Anthropometrics:   Height: 71 inches Weight: 165 lb today 178 lb on 11/8 BMI: 23  7% weight loss in the last month, significant   NUTRITION DIAGNOSIS: Inadequate oral intake related to cancer and cancer related treatment side effects as evidenced by 7% weight loss in the last month and poor appetite   INTERVENTION:  Discussed ways to add calories and protein to foods (adding peanut butter or cheese to toast, extra dressing, cheese, meat to salad, etc) Handout mailed Encouraged 350 calorie shake or higher and continue 3-4 times per day Will mail taste change handout as well for patient to review.   Contact information to be mailed    MONITORING, EVALUATION, GOAL: weight trends, intake   NEXT VISIT: Jan 3rd, phone f/u  Nadine Ryle B. Zenia Resides, [place], [place] Registered Dietitian [phone] (mobile)

## 2020-08-24 NOTE — Progress Notes (Signed)
I spoke with community pharmacist.  Insurance will not cover OxyContin.  We will send a new Rx for Xtampza ER 13.5 mg every 12 hours #60.  Patient was seen by Dr. Mike Gip.  Patient continues to take oxycodone IR 15 mg (half to 1 tablet) about 4 times a day.  Pain is still poorly controlled.

## 2020-08-24 NOTE — Progress Notes (Signed)
Patient here for oncology follow-up appointment, expresses concerns of chronic constipation and nausea.

## 2020-08-25 ENCOUNTER — Encounter: Payer: Self-pay | Admitting: Hematology and Oncology

## 2020-08-26 ENCOUNTER — Inpatient Hospital Stay (HOSPITAL_BASED_OUTPATIENT_CLINIC_OR_DEPARTMENT_OTHER): Payer: Medicare Other | Admitting: Hospice and Palliative Medicine

## 2020-08-26 DIAGNOSIS — C3490 Malignant neoplasm of unspecified part of unspecified bronchus or lung: Secondary | ICD-10-CM | POA: Diagnosis not present

## 2020-08-26 DIAGNOSIS — Z515 Encounter for palliative care: Secondary | ICD-10-CM | POA: Diagnosis not present

## 2020-08-26 NOTE — Progress Notes (Signed)
Virtual Visit via Telephone Note  I connected with Dwayne Thompson on 08/26/20 at 11:30 AM EST by telephone and verified that I am speaking with the correct person using two identifiers.  Location: Patient: home Provider: clinic   I discussed the limitations, risks, security and privacy concerns of performing an evaluation and management service by telephone and the availability of in person appointments. I also discussed with the patient that there may be a patient responsible charge related to this service. The patient expressed understanding and agreed to proceed.   History of Present Illness: Dwayne Thompson is a 74 y.o. 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  He was admitted to the hospital on 07/21/2020 with shortness of breath.  Patient was found to have bilateral lower extremity DVTs and extensive pulmonary emboli in all lobes of the right lung with evidence of right heart strain.  He was also noted to have worsening mediastinal and upper abdominal adenopathy and worsening cardiophrenic/abdominal wall mass on the CTA of the chest.  Patient underwent mechanical thrombectomy on 07/21/2020.  Palliative care is consulted to help address goals.   Observations/Objective: I spoke with patient and wife by phone.    They report that they met with Dr. Mike Gip this week and were told that patient is having disease progression and will need to be rotated to a new, potentially harsher systemic chemotherapy.  CTs on 08/20/2020 revealed concern for disease progression with intercostal extension of tumor in ninth, 10th, and 11th intercostal spaces and progression in gastric adenopathy and with worse skeletal  metastasis.  Patient says that he is unsure if he wants more treatment.  He seems overwhelmed with decision-making.  He would like a visit in the home to update and discuss goals with his children.  I will refer him to home-based palliative care to facilitate that discussion.  We again discussed end-of-life decision-making, CODE STATUS, and option of focusing more on quality of life and comfort.  Symptomatically, patient continues to endorse generalized pain and is taking as needed oxycodone 4-5 times a day.  He has not yet started the [facility] ER and we again discussed his pain regimen in detail.  Assessment and Plan: Stage IV small cell lung cancer -recent evidence of disease progression on imaging.  Patient considering future treatment.  Referral to home-based palliative care to facilitate family meeting in the home  Neoplasm related pain -continue oxycodone IR for breakthrough pain.  Recommend starting Xtampza ER 13.5 mg every 12 hours (patient has this in the home but is not yet started).  Opioid-induced constipation -continue daily bowel regimen  ACP -patient still considering decision making.   Follow Up Instructions: Follow-up virtual visit 2 weeks   I discussed the assessment and treatment plan with the patient. The patient was provided an opportunity to ask questions and all were answered. The patient agreed with the plan and demonstrated an understanding of the instructions.   The patient was advised to call back or seek an in-person evaluation if the symptoms worsen or if the condition fails to improve as anticipated.  I provided 30 minutes of non-face-to-face time during this encounter.   Irean Hong, NP

## 2020-08-27 ENCOUNTER — Encounter: Payer: Self-pay | Admitting: Pulmonary Disease

## 2020-08-27 ENCOUNTER — Telehealth: Payer: Self-pay | Admitting: Primary Care

## 2020-08-27 ENCOUNTER — Other Ambulatory Visit: Payer: Self-pay

## 2020-08-27 ENCOUNTER — Ambulatory Visit (INDEPENDENT_AMBULATORY_CARE_PROVIDER_SITE_OTHER): Payer: Medicare Other | Admitting: Pulmonary Disease

## 2020-08-27 ENCOUNTER — Inpatient Hospital Stay: Payer: Medicare Other

## 2020-08-27 VITALS — BP 104/64 | HR 100 | Temp 97.8°F | Ht 71.0 in | Wt 163.8 lb

## 2020-08-27 DIAGNOSIS — J90 Pleural effusion, not elsewhere classified: Secondary | ICD-10-CM

## 2020-08-27 DIAGNOSIS — J9621 Acute and chronic respiratory failure with hypoxia: Secondary | ICD-10-CM | POA: Diagnosis not present

## 2020-08-27 DIAGNOSIS — I2609 Other pulmonary embolism with acute cor pulmonale: Secondary | ICD-10-CM | POA: Diagnosis not present

## 2020-08-27 DIAGNOSIS — J432 Centrilobular emphysema: Secondary | ICD-10-CM | POA: Diagnosis not present

## 2020-08-27 DIAGNOSIS — C3490 Malignant neoplasm of unspecified part of unspecified bronchus or lung: Secondary | ICD-10-CM

## 2020-08-27 DIAGNOSIS — Z452 Encounter for adjustment and management of vascular access device: Secondary | ICD-10-CM | POA: Diagnosis not present

## 2020-08-27 MED ORDER — HEPARIN SOD (PORK) LOCK FLUSH 100 UNIT/ML IV SOLN
500.0000 [IU] | Freq: Once | INTRAVENOUS | Status: AC
  Administered 2020-08-27: 500 [IU] via INTRAVENOUS
  Filled 2020-08-27: qty 5

## 2020-08-27 NOTE — Telephone Encounter (Signed)
Spoke with patient's wife, [name], to let her know that Dwayne Chang, NP wanted me to reach back out to them to schedule a Palliative Consult.  After a lengthy discussion with wife she agreed to schedule visit.  I have scheduled an In-person Consult for 09/21/20 @ 11 AM.  Wife said that patient wanted to wait until after the holidays before doing anything, because he didn't want to spoil Christmas for the children.

## 2020-08-27 NOTE — Progress Notes (Signed)
Subjective:    Patient ID: Dwayne Thompson, male    DOB: [birth year], 74 y.o.   MRN: [id number]  HPI Patient is a 74 year old former smoker (quit 2015, 50-pack-year history) who presents for routine follow-up.  Recall that he issues with a left pleural effusion and was evaluated at that time but subsequently required admission to [facility] in the early hours part of October due to hemothorax.  CT scan of the chest performed after thoracentesis showed the patient had large tumor burden.  PET/CT confirmed.  Patient underwent biopsy of the chest wall on 6 October and this revealed small cell carcinoma.  He underwent placement of a Pleurx catheter on the left chest at that time.  The catheter had been draining fairly well until approximately a few when the amount of fluid decreased.  Reviewing his most recent CT it appears that the catheter is no longer in the pocket of fluid.  He has undergone 2 cycles of carboplatin, etoposide and Tecentriq between 18 October through 15 November.  In the interim, he developed PE and bilateral DVTs and required thrombectomy this was on 2 November.  Alternative chemotherapies are now being considered however the agents under consideration are quite toxic.  He has been doing communication with the Palliative Care team and is debating whether to go ahead with salvage type agents or proceed to transition to hospice.  He is having quite a lot of cancer related pain but appears to be fairly well controlled at present.  States that at least in his current state that as he can "enjoy some things".  He has not had frank discussion with his children as to his most recent findings on CT and that chemotherapy has been put to a stop right now.  The majority of the visit was spent in discussing end-of-life issues quality of life.  He expresses that he would want to contact his children sometime this weekend and discuss his health issues with them.  With regards to his shortness of breath he feels  that he is well compensated in this regard.  He continues to be on oxygen liters per minute and appears to be very comfortable with this.  If anything this is being palliative for him.  Review of Systems A 10 point review of systems was performed and it is as noted above otherwise negative.  Patient Active Problem List   Diagnosis Date Noted  . Hyponatremia 08/16/2020  . Hypomagnesemia 08/06/2020  . Acute bilateral deep vein thrombosis (DVT) of popliteal veins (HCC) 07/27/2020  . Pneumonia due to infectious organism 07/27/2020  . Goals of care, counseling/discussion 07/27/2020  . Protein-calorie malnutrition, severe 07/23/2020  . Pulmonary embolism ([place]) 07/22/2020  . Palliative care encounter   . Respiratory failure, acute-on-chronic ([place]) 07/21/2020  . Hyperlipidemia   . GERD (gastroesophageal reflux disease)   . COPD (chronic obstructive pulmonary disease) ([place])   . BPH (benign prostatic hyperplasia)   . Poor appetite 07/19/2020  . Pressure injury of buttock, stage 1 07/19/2020  . Pleural effusion 07/15/2020  . Renal insufficiency 07/15/2020  . Normocytic anemia 07/06/2020  . Cancer-related pain 07/06/2020  . Encounter for antineoplastic chemotherapy 07/06/2020  . Encounter for antineoplastic immunotherapy 07/06/2020  . AKI (acute kidney injury) ([place])   . Hypotension   . Small cell lung cancer ([place]) 06/27/2020  . Chest tube in place   . Pressure injury of skin 06/23/2020  . Pleural mass   . Bone metastasis ([place])   . Hemothorax   .  Acute on chronic respiratory failure with hypoxia ([place]) 06/22/2020  . Cirrhosis ([place])   . Hemothorax on left   . Left flank pain 04/23/2020  . Hypertension 03/12/2019  . Thoracic aortic aneurysm ([place]) 03/12/2019  . Abnormal chest CT 02/13/2019  . Difficulty walking 12/06/2018  . Muscle weakness 12/06/2018  . Cellulitis of lower leg 09/17/2018  . CLE (columnar lined esophagus)   . Special screening for malignant neoplasms, colon   . Benign  neoplasm of ascending colon   . Polyp of sigmoid colon   . Diverticulosis of large intestine without diverticulitis   . Iron deficiency anemia due to chronic blood loss 02/18/2018  . Folate deficiency 02/14/2018  . B12 deficiency 01/10/2018  . Folic acid deficiency 16/06/9603  . 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 Helicobacter pylori gastritis (chronic gastritis) 01/10/2018  . Thrombocytopenia ([place]) 01/10/2018  . Internal hemorrhoids   . Acute gastrointestinal hemorrhage   . Stomach irritation   . Duodenal nodule   . Melena 01/01/2018  . Neck pain on right side 03/23/2016  . History of tobacco abuse 03/23/2016  . Cervical lymphadenitis 03/23/2016  . 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 Allergic rhinitis 03/28/2015  . 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 Failure of erection 03/28/2015  . Acid reflux 03/28/2015  . Arthritis, degenerative 03/28/2015  . Change in blood platelet count 03/28/2015  . Temporary cerebral vascular dysfunction 03/28/2015  . Personal history of tobacco use, presenting hazards to health 03/28/2015   Allergies  Allergen Reactions  . Indomethacin Swelling    ankle swelling  . Tamsulosin Hcl Hives  . Sulfa Antibiotics Rash   Current Meds  Medication Sig  . acetaminophen (TYLENOL) 325 MG tablet Take 2 tablets (650 mg total) by mouth 3 (three) times daily.  Marland Kitchen apixaban (ELIQUIS) 5 MG TABS tablet Take 10mg  (2 tabs) twice daily for 7 days. Then on 8th day, take 5mg  (1 tab) twice daily.  . cholecalciferol (VITAMIN D3) 25 MCG (1000 UNIT) tablet Take 1,000 Units by mouth daily.  Marland Kitchen dutasteride (AVODART) 0.5 MG capsule Take 0.5 mg by mouth daily.  . feeding supplement (ENSURE ENLIVE / ENSURE PLUS) LIQD Take 237 mLs by mouth 2 (two) times daily between meals.  . folic acid (FOLVITE) 1 MG tablet  Take 1 tablet (1 mg total) by mouth daily.  Marland Kitchen lactulose (CHRONULAC) 10 GM/15ML solution Take 45 mLs (30 g total) by mouth 2 (two) times daily.  Marland Kitchen lidocaine (LIDODERM) 5 % Place 1 patch onto the skin daily. Remove & Discard patch within 12 hours or as directed by MD  . Lidocaine (SALONPAS PAIN RELIEVING) 4 % PTCH Apply 1 patch topically every morning.  . loratadine (CLARITIN) 10 MG tablet Take 10 mg by mouth daily.  . magnesium 30 MG tablet Take 400 mg by mouth daily.  . megestrol (MEGACE) 40 MG/ML suspension Take 5 mLs (200 mg total) by mouth daily.  . midodrine (PROAMATINE) 10 MG tablet Take 1 tablet (10 mg total) by mouth 4 (four) times daily.  . montelukast (SINGULAIR) 10 MG tablet TAKE 1 TABLET BY MOUTH EVERY DAY  . oxyCODONE (ROXICODONE) 15 MG immediate release tablet Take 1 tablet (15 mg total) by mouth every 4 (four) hours as needed for pain.  Marland Kitchen oxyCODONE ER (XTAMPZA ER) 13.5 MG C12A Take 1 capsule by mouth every 12 (twelve) hours.  Marland Kitchen  pantoprazole (PROTONIX) 20 MG tablet TAKE ONE TABLET BY MOUTH TWICE A DAY  . polyethylene glycol (MIRALAX / GLYCOLAX) 17 g packet Take 17 g by mouth daily as needed for moderate constipation.  . potassium chloride (KLOR-CON) 10 MEQ tablet Take 1 tablet (10 mEq total) by mouth 2 (two) times daily.  . rosuvastatin (CRESTOR) 10 MG tablet Take 1 tablet (10 mg total) by mouth daily.  . vitamin B-12 1000 MCG tablet Take 1 tablet (1,000 mcg total) by mouth daily.   Immunization History  Administered Date(s) Administered  . Fluad Quad(high Dose 65+) 07/02/2019, 06/26/2020  . Hepatitis A, Adult 03/27/2019, 09/30/2019  . Hepatitis B, adult 03/27/2019, 07/29/2019, 09/30/2019  . Influenza, High Dose Seasonal PF 08/19/2015, 09/02/2016, 10/03/2017, 05/31/2018  . Influenza,inj,quad, With Preservative 05/20/2018  . Moderna SARS-COVID-2 Vaccination 11/16/2019, 12/14/2019  . Pneumococcal Conjugate-13 02/06/2014  . Pneumococcal Polysaccharide-23 03/05/2012  .  Pneumococcal-Unspecified 09/19/2014  . Tdap 04/28/2007  . Zoster 12/19/2011       Objective:   Physical Exam BP 104/64 (BP Location: Left Arm, Cuff Size: Normal)   Pulse 100   Temp 97.8 F (36.6 C) (Temporal)   Ht 5\' 11"  (1.803 m)   Wt 163 lb 12.8 oz (74.3 kg)   SpO2 97%   BMI 22.85 kg/m  GENERAL: Well-developed, thin gentleman in no acute respiratory distress. He presents in a transport chair.  He has lost 20 pounds from his prior visit here.  He is developing temporal wasting. HEAD: Normocephalic, atraumatic.  EYES: Pupils equal, round, reactive to light. No scleral icterus.  MOUTH: Nose/mouth/throat not examined due to masking requirements for COVID 19. NECK: Supple. No thyromegaly. Trachea midline. No JVD. No adenopathy. PULMONARY: Diminished air entry in the mid to lower left lung field. No adventitious sounds.  Pleurx catheter on the left. CARDIOVASCULAR: S1 and S2. Regular rate and rhythm. No rubs, murmurs or gallops heard. ABDOMEN: Benign. MUSCULOSKELETAL: No joint deformity, no clubbing, no edema.  NEUROLOGIC: No overt focal deficit.Speech is fluent. SKIN: Intact,warm,dry. On limited exam: No rashes. PSYCH: Mood is depressed, behavior normal.   Representative slice of the most recent CT scan of the chest performed to December 2021 showing advancing disease.  No significant response to chemotherapy:       Assessment & Plan:     ICD-10-CM   1. Small cell lung cancer [facility])  C34.90    Patient has extensive small cell lung cancer Cancer has not been responding to chemotherapy Patient contemplating between hospice care or salvage therapy  2. Other acute pulmonary embolism with acute cor pulmonale (HCC)  I26.09    Continue Eliquis Status post thrombectomy  3. Acute on chronic respiratory failure with hypoxia (HCC)  J96.21    This has developed with stents of cancer Not due to COPD Continue oxygen at 3 L/min  4. Centrilobular emphysema ([place])  J43.2    On no  medications for this No exacerbation No need for bronchodilators  5. Pleural effusion  J90    Does not appear to be enlarging Status post pleurodesis-Pleurx Pleurx not draining Leave in for now   Discussion:  Patient has extensive small cell lung cancer.  Poorly responsive to chemotherapy.  He is now considering transitioning to comfort care versus undergoing alternative chemotherapeutic agents.  He is in conversation with Palliative Care.  From our standpoint there are no therapeutic interventions necessary today.  I did encourage him to update his children as to his current status and issues with his cancer treatment.  We will arrange for a telephone follow-up visit in 4 to 5 weeks time.  He is to contact us prior to that time should any new difficulties arise.   Renold Don, MD [facility]   *This note was dictated using voice recognition software/Dragon.  Despite best efforts to proofread, errors can occur which can change the meaning.  Any change was purely unintentional.

## 2020-08-27 NOTE — Patient Instructions (Signed)
Phone visit in 4-5 weeks. Call sooner if you develop any new or worsening symptoms.

## 2020-08-28 ENCOUNTER — Encounter: Payer: Self-pay | Admitting: Pulmonary Disease

## 2020-08-31 ENCOUNTER — Telehealth: Payer: Self-pay | Admitting: Primary Care

## 2020-08-31 NOTE — Telephone Encounter (Signed)
At the request of the Palliative NP, I called patient's wife to see if we could reschedule the 09/21/20 Palliative Consult, no answer - left message requesting a return call, left my name and call back number.

## 2020-09-01 ENCOUNTER — Telehealth: Payer: Self-pay | Admitting: Primary Care

## 2020-09-01 NOTE — Telephone Encounter (Signed)
Rec'd call back from patient's wife, [name], and asked her if we could reschedule the Palliative Consult on 09/21/20 to 09/22/20 @ 3 PM and she was in agreement with this.  She was on hold with her MD and said that she would double check to make sure that this was okay and if not she would call me back.

## 2020-09-01 NOTE — Telephone Encounter (Signed)
Called patient's home number to reschedule the Palliative Consult, (per NP's request, she is not working the day of the scheduled appointment) - no answer.  I then called wife's cell number with no answer and unable to leave a message due to mailbox was full.

## 2020-09-03 ENCOUNTER — Other Ambulatory Visit: Payer: Self-pay | Admitting: Hematology and Oncology

## 2020-09-03 ENCOUNTER — Other Ambulatory Visit: Payer: Self-pay

## 2020-09-03 ENCOUNTER — Telehealth: Payer: Self-pay | Admitting: Family Medicine

## 2020-09-03 DIAGNOSIS — C3490 Malignant neoplasm of unspecified part of unspecified bronchus or lung: Secondary | ICD-10-CM

## 2020-09-03 MED ORDER — MIDODRINE HCL 10 MG PO TABS: 10.0000 mg | ORAL_TABLET | Freq: Four times a day (QID) | ORAL | 0 refills | Status: AC

## 2020-09-03 NOTE — Telephone Encounter (Signed)
Just FYI. Thanks!

## 2020-09-03 NOTE — Telephone Encounter (Signed)
Dwayne Thompson OT with [facility] is calling to report pt will  miss OT visit today due to no feeling well

## 2020-09-16 ENCOUNTER — Inpatient Hospital Stay (HOSPITAL_BASED_OUTPATIENT_CLINIC_OR_DEPARTMENT_OTHER): Payer: Medicare Other | Admitting: Hospice and Palliative Medicine

## 2020-09-16 DIAGNOSIS — Z515 Encounter for palliative care: Secondary | ICD-10-CM

## 2020-09-16 DIAGNOSIS — G893 Neoplasm related pain (acute) (chronic): Secondary | ICD-10-CM | POA: Diagnosis not present

## 2020-09-16 DIAGNOSIS — C3490 Malignant neoplasm of unspecified part of unspecified bronchus or lung: Secondary | ICD-10-CM

## 2020-09-16 MED ORDER — OXYCODONE HCL 15 MG PO TABS: 15.0000 mg | ORAL_TABLET | ORAL | 0 refills | Status: AC | PRN

## 2020-09-16 MED ORDER — APIXABAN 5 MG PO TABS
5.0000 mg | ORAL_TABLET | Freq: Two times a day (BID) | ORAL | 0 refills | Status: DC
Start: 2020-09-16 — End: 2020-10-02

## 2020-09-16 MED ORDER — XTAMPZA ER 13.5 MG PO C12A: 1.0000 | EXTENDED_RELEASE_CAPSULE | Freq: Two times a day (BID) | ORAL | 0 refills | Status: AC

## 2020-09-16 NOTE — Progress Notes (Signed)
Virtual Visit via Video Note  I connected with Dwayne Thompson on 09/16/20 at  1:15 PM EST by a video enabled telemedicine application and verified that I am speaking with the correct person using two identifiers.  Location: Patient: home Provider: clinic   I discussed the limitations of evaluation and management by telemedicine and the availability of in person appointments. The patient expressed understanding and agreed to proceed.  History of Present Illness: Dwayne Thompson is a 74 y.o. 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  He was admitted to the hospital on 07/21/2020 with shortness of breath.  Patient was found to have bilateral lower extremity DVTs and extensive pulmonary emboli in all lobes of the right lung with evidence of right heart strain.  He was also noted to have worsening mediastinal and upper abdominal adenopathy and worsening cardiophrenic/abdominal wall mass on the CTA of the chest.  Patient underwent mechanical thrombectomy on 07/21/2020.  Palliative care is consulted to help address goals.   Observations/Objective: Family meeting via [place] with patient, wife, and two daughters.  Patient's son and another daughter participated via phone.  Patient reiterated that when he last spoke with Dr. Mike Gip he was told that further treatment would have a limited response rate and potentially be associated with symptomatic toxicity.  We had a lengthy discussion today regarding his overall goals.  Patient admits that his performance status is declining.  He is mostly chair bound and only able to ambulate short distances with the use of a walker.  His wife, who also has stage IV cancer, has become increasingly  burdened with his care in the home.  They are requesting increased resources at home to assist with daily care.  We talked about the option of hospice in detail.  Patient seemed to be leaning towards hospice and comfort care at home.  However, he wants to speak with Dr. Mike Gip to ascertain her thoughts prior to deciding on a course of action.  Symptomatically, he says that pain is somewhat improved with taking the [facility] ER.  However, wife says that she has missed doses at times causing exacerbation of his pain.  We discussed this regimen in detail and all questions were answered.  Note that should patient ultimately decide to pursue hospice, he would need to be rotated off [place] ER.  I will refill pain medications today per family's request.  I will also refill Eliquis per family's request.  We will try to see if community pharmacy will dispense 90-day supply in anticipation that Eliquis will not be covered by hospice formulary if that is ultimately what patient decides.  We discussed CODE STATUS.  Patient stated that he would not want to be resuscitated nor have his life prolonged artificially on machines.  He was in agreement DNR/DNI.  Family also agreed with this decision.  DNR order signed and will be mailed to patient's home.  Assessment and Plan: Stage IV small cell lung cancer -recent evidence of disease progression on imaging.  Hospice discussed as an option.  Patient wants to speak with Dr. Mike Gip.  He is also pending palliative care visit next week.  Recent PE/B. LE DVT - Refill Eliquis 5mg  BID  Neoplasm related pain -Refill oxycodone IR for breakthrough pain, #60.  Refill Xtampza ER 13.5 mg  every 12 hours #60  Opioid-induced constipation -continue daily bowel regimen  ACP -DNR form mailed to home  Case and plan discussed with Dr. Mike Gip  Follow Up Instructions: My Chart visit 1-2 months   I discussed the assessment and treatment plan with the patient. The patient was  provided an opportunity to ask questions and all were answered. The patient agreed with the plan and demonstrated an understanding of the instructions.   The patient was advised to call back or seek an in-person evaluation if the symptoms worsen or if the condition fails to improve as anticipated.  I provided 75 minutes of non-face-to-face time during this encounter.   Irean Hong, NP

## 2020-09-19 DIAGNOSIS — T451X5A Adverse effect of antineoplastic and immunosuppressive drugs, initial encounter: Secondary | ICD-10-CM | POA: Insufficient documentation

## 2020-09-19 DIAGNOSIS — D701 Agranulocytosis secondary to cancer chemotherapy: Secondary | ICD-10-CM | POA: Insufficient documentation

## 2020-09-21 ENCOUNTER — Inpatient Hospital Stay: Payer: Medicare Other | Attending: Internal Medicine

## 2020-09-21 ENCOUNTER — Other Ambulatory Visit: Payer: Self-pay | Admitting: Primary Care

## 2020-09-21 NOTE — Progress Notes (Signed)
Nutrition Follow-up:  Patient with small cell lung cancer.  Patient followed by Palliative Care and planning home based visit.  Noted considering hospice care.    Spoke with patient and wife via phone.  Wife feels like appetite is better. Drinking ensure shakes.  Eating soups, cereal, fruit. Sometimes is only able to eat few spoonfuls.  Wife concerned about power being off this am and them getting off track with medication schedule and eating today.    Medications: reviewed  Labs: reviewed  Anthropometrics:   Weight 163 lb decreased from 165 lb on 12/6.   NUTRITION DIAGNOSIS: Inadequate oral intake continues   INTERVENTION:  Encouraged high calorie, high protein foods and reviewed ways to add calories to diet.   Encouraged 350 calorie shake Encouraged patient to maximize good days Provided wife with contact information and RD available if needed in the future.     MONITORING, EVALUATION, GOAL: weight trends, intake   NEXT VISIT: no follow-up.  RD available as needed  Brittish Bolinger B. Zenia Resides, [place], [place] Registered Dietitian [phone] (mobile)

## 2020-09-22 ENCOUNTER — Inpatient Hospital Stay: Payer: Medicare Other | Admitting: Hospice and Palliative Medicine

## 2020-09-22 ENCOUNTER — Other Ambulatory Visit: Payer: Self-pay

## 2020-09-22 ENCOUNTER — Other Ambulatory Visit: Payer: Self-pay | Admitting: Primary Care

## 2020-09-22 ENCOUNTER — Other Ambulatory Visit: Payer: Medicare Other | Admitting: Primary Care

## 2020-09-22 DIAGNOSIS — D701 Agranulocytosis secondary to cancer chemotherapy: Secondary | ICD-10-CM

## 2020-09-22 DIAGNOSIS — Z9689 Presence of other specified functional implants: Secondary | ICD-10-CM

## 2020-09-22 DIAGNOSIS — G893 Neoplasm related pain (acute) (chronic): Secondary | ICD-10-CM

## 2020-09-22 DIAGNOSIS — J948 Other specified pleural conditions: Secondary | ICD-10-CM

## 2020-09-22 DIAGNOSIS — Z515 Encounter for palliative care: Secondary | ICD-10-CM

## 2020-09-22 DIAGNOSIS — T451X5A Adverse effect of antineoplastic and immunosuppressive drugs, initial encounter: Secondary | ICD-10-CM

## 2020-09-22 DIAGNOSIS — C3490 Malignant neoplasm of unspecified part of unspecified bronchus or lung: Secondary | ICD-10-CM

## 2020-09-22 DIAGNOSIS — C7951 Secondary malignant neoplasm of bone: Secondary | ICD-10-CM

## 2020-09-22 NOTE — Progress Notes (Signed)
[facility] Palliative Care Consult Note Telephone: [phone]  Fax: [phone]     Date of encounter: 09/22/20 PATIENT NAME: Dwayne Thompson [place] [address] [phone] (home)  DOB: 01-Apr-1946 MRN: [id number]  PRIMARY CARE PROVIDER:    Jerrol Banana., MD,  [address] [place] [address] [phone]  REFERRING PROVIDER:   Billey Chang NP [place], [place]   RESPONSIBLE PARTY:   Extended Emergency Contact Information Primary Emergency Contact: Aaditya, Letizia Address: [address]          [place], [place] [address] Johnnette Litter of [place] Phone: [phone] Mobile Phone: [phone] Relation: Spouse Secondary Emergency Contact: [place] Mobile Phone: [phone] Relation: Daughter  I met face to face with patient and family in the   home. Palliative Care was asked to follow this patient by consultation request of Josh Borders, Np to help address advance care planning and goals of care. This is the initial visit.   ASSESSMENT AND RECOMMENDATIONS:   1. Advance Care Planning/Goals of Care: Goals include to maximize quality of life and symptom management. Our advance care planning conversation included a discussion about:     The value and importance of advance care planning   Experiences with loved ones who have been seriously ill or have died   Exploration of personal, cultural or spiritual beliefs that might influence medical decisions   Exploration of goals of care in the event of a sudden injury or illness   Identification  of a healthcare agent   Review  And creation of an  advance directive document .  Decision discussed o de-escalate disease focused treatments due to poor prognosis.  I met with Dwayne Thompson and his family in their home. He has questions about choosing next steps for his care.  We talked extensively about pursuing treatments that may have  minimal positive outcome versus hospice with  supportive care and comfort goals. Wife states that she has been diagnosed with advanced cancer as well and they seem to want to make a decision that will take in to consideration the situation for both of them.   We discussed his disease progression which has been detected on scans, and his baseline clinical picture is quite debilitated, based on nutritional and mobility markers.He feels that he would like to speak with his oncologist to further discuss chemo options.  We discussed de -escalation of medical management but a decision was not  fully yet made. His wife Kennyth Lose is his Marine scientist. I plan to follow up with oncology team and per patient's request ask them to schedule a video conference call to  discuss treatment options. I will also discuss with oncology team so that I may clarify for patient if necessary. The family and he were also debating speaking with Hospice but Patient would like to speak with oncology first. I made the  recommendation his needs would best be served by the supportive care of Hospice but that he should  consider the treatment offered before making that decision.  We discussed the MOST  form. He was somewhat reticent but stated he would not want to be kept alive on machines and did not want a bunch of tubes; if it was his time he wanted to be allowed to go. He does have a DNR already. We used the MOST form as a  springboard to discussion, and filled it out to his wishes which were limited scope of  treatment use of antibiotics and limited IVs and a feeding tubes. However he did not want to sign it yet. I left it in his possession  and let him know that this was for his benefit, these were  his choices and that he could destroy it or sign it according to his wishes.    I spent 55 minutes providing this consultation,  from 1030 to 1125. More than 50% of the time in this consultation was spent coordinating communication.    --------------------------------------------------------------------------------------------------------------- 2. Symptom Management:   Constipation: C/o constipation which is significant. He has lactulose in the home but had stopped taking it. I recommended he begin that with several doses to get his constipation relieved and then to begin Senna 1 to 3 tablets a day depending on bowel function.  Pain: We discussed his pain which appears to be escalating. He endorses increasing pain in his viscera. We discussed use of ER pain medicine as well as establishing an increased basal dose based on PRN use. I've asked them to keep a journal of PRN usage and we can address and convert the Xtampza  to morphine ER with IR breakthrough. He may need to increase prn dosing to q 3 hours, which I explained was reasonable and could be discussed with prescriber. We discussed conversion to morphine for better relief of dyspnea and affordability.Aggressive bowel programming is also necessary as narcotics increase.Recommend continued work up to establish effective pain management protocol  Nutrition: Last weight is reported at 163 lbs but subjectively 10 lbs or more less. Cachectic. He is concerned about his portacath prominence but that is due to loss of body fat over the device. Endorses poor intake, both nutrition and water. Recommended several preparations of supplemental drink. They have also stopped using megace, which I encouraged them to resume for improving appetite.   Caregiver Strain: Discussed need for assistance for wife who also is ill. Discussed his eligibility for hospice services with concordant goals of care and treatment. Discussed hiring additional caregivers and family support.   3. Follow up Palliative Care Visit: Palliative care will continue to follow for goals of care clarification and symptom management. Return 10 days or prn.  4. Family /Caregiver/Community Supports: Lives with wife who also  has cancer and is undergoing new protocol of treatment. Has daughters in the area as well for support.   5. Cognitive / Functional decline: A and O but appears hesitant on some decisions. Unclear if it's cognition or indecision. Can ambulate 47' with DOE which requires 5 min. To recover. Needs assistance with most adls, all iadls.  CODE STATUS:DNR  PPS: 30%  HOSPICE ELIGIBILITY/DIAGNOSIS: yes with concordant goals/metastatic small cell lung cancer   Subjective:  CHIEF COMPLAINT: pain, constipation  HISTORY OF PRESENT ILLNESS:  FRENCH KENDRA is a 75 y.o. 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 Today he voices issues with constipation and pain. Constipation is chronic and moderately severe as he has not had sufficient evacuation in some time. Medications worsen it as does poor po intake. He has lactulose but has not been taking it. I recommended several doses to begin then to maintain on senna s and titrate to efficacy.  Pain is constant and moderate, visceral ie gnawing and generalized.  Oxycodone 15 mg IR helps relieve, and his basal dose of xtampza 13.5 mg also helps. He endorses  increasing pain in the context of metastatic cancer and bone involvement.  We are asked to consult around advance care planning and complex medical decision making.    Review and summarization of old 14 records shows or history from other than patient for history and previous protocols Review or lab tests, radiology,  or medicine=  Review of recent CT scans of disease progression. Creatinine 0.79, albumin 3.5. Hgb 8.8 on on 08/24/20 Review of case with family member Wife Kennyth Lose and daughters.  History obtained from review of EMR, discussion with primary team, and  interview with family, caregiver  and/or Mr. Rudzinski. Records reviewed and summarized  above.   CURRENT PROBLEM LIST:  Patient Active Problem List   Diagnosis Date Noted  . Chemotherapy-induced neutropenia ([place]) 09/19/2020  . Hyponatremia 08/16/2020  . Hypomagnesemia 08/06/2020  . Acute bilateral deep vein thrombosis (DVT) of popliteal veins ([facility]) 07/27/2020  . Pneumonia due to infectious organism 07/27/2020  . Goals of care, counseling/discussion 07/27/2020  . Protein-calorie malnutrition, severe 07/23/2020  . Pulmonary embolism ([place]) 07/22/2020  . Palliative care encounter   . Respiratory failure, acute-on-chronic ([place]) 07/21/2020  . Hyperlipidemia   . GERD (gastroesophageal reflux disease)   . COPD (chronic obstructive pulmonary disease) ([place])   . BPH (benign prostatic hyperplasia)   . Poor appetite 07/19/2020  . Pressure injury of buttock, stage 1 07/19/2020  . Pleural effusion 07/15/2020  . Renal insufficiency 07/15/2020  . Normocytic anemia 07/06/2020  . Cancer-related pain 07/06/2020  . Encounter for antineoplastic chemotherapy 07/06/2020  . Encounter for antineoplastic immunotherapy 07/06/2020  . AKI (acute kidney injury) ([place])   . Hypotension   . Small cell lung cancer ([place]) 06/27/2020  . Chest tube in place   . Pressure injury of skin 06/23/2020  . Pleural mass   . Bone metastasis ([place])   . Hemothorax   . Acute on chronic respiratory failure with hypoxia ([place]) 06/22/2020  . Cirrhosis ([place])   . Hemothorax on left   . Left flank pain 04/23/2020  . Hypertension 03/12/2019  . Thoracic aortic aneurysm ([place]) 03/12/2019  . Abnormal chest CT 02/13/2019  . Difficulty walking 12/06/2018  . Muscle weakness 12/06/2018  . Cellulitis of lower leg 09/17/2018  . CLE (columnar lined esophagus)   . Special screening for malignant neoplasms, colon   . Benign neoplasm of ascending colon   . Polyp of sigmoid colon   . Diverticulosis of large intestine without diverticulitis   . Iron deficiency anemia due to chronic blood loss 02/18/2018  . Folate deficiency  02/14/2018  . B12 deficiency 01/10/2018  . Folic acid deficiency 49/67/5916  . 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 Helicobacter pylori gastritis (chronic gastritis) 01/10/2018  . Thrombocytopenia ([place]) 01/10/2018  . Internal hemorrhoids   . Acute gastrointestinal hemorrhage   . Stomach irritation   . Duodenal nodule   . Melena 01/01/2018  . Neck pain on right side 03/23/2016  . History of tobacco abuse 03/23/2016  . Cervical lymphadenitis 03/23/2016  . 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 Allergic rhinitis 03/28/2015  . 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 Failure of erection 03/28/2015  . Acid reflux 03/28/2015  . Arthritis, degenerative 03/28/2015  . Change in blood platelet count 03/28/2015  . Temporary cerebral vascular dysfunction 03/28/2015  . Personal history of tobacco use, presenting hazards to health 03/28/2015   PAST MEDICAL HISTORY:  Active Ambulatory Problems    Diagnosis Date Noted  . Allergic rhinitis 03/28/2015  . 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 Failure of erection 03/28/2015  . Acid reflux 03/28/2015  . Arthritis, degenerative 03/28/2015  . Change in blood platelet count 03/28/2015  . Temporary cerebral vascular dysfunction 03/28/2015  . Personal history of tobacco use, presenting hazards to health 03/28/2015  . 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 Neck pain on right side 03/23/2016  . History of tobacco abuse 03/23/2016  . Cervical lymphadenitis 03/23/2016  . Melena 01/01/2018  . Duodenal nodule   . Internal hemorrhoids   . Acute gastrointestinal hemorrhage   . Stomach irritation   . B12 deficiency 01/10/2018  . Folic acid deficiency  01/10/2018  . 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 Helicobacter pylori gastritis (chronic gastritis) 01/10/2018  . Thrombocytopenia ([place]) 01/10/2018  . Folate deficiency 02/14/2018  . Iron deficiency anemia due to chronic blood loss 02/18/2018  . CLE (columnar lined esophagus)   . Special screening for malignant neoplasms, colon   . Benign neoplasm of ascending colon   . Polyp of sigmoid colon   . Diverticulosis of large intestine without diverticulitis   . Cellulitis of lower leg 09/17/2018  . Difficulty walking 12/06/2018  . Muscle weakness 12/06/2018  . Abnormal chest CT 02/13/2019  . Hypertension 03/12/2019  . Thoracic aortic aneurysm ([place]) 03/12/2019  . Left flank pain 04/23/2020  . Acute on chronic respiratory failure with hypoxia ([place]) 06/22/2020  . Cirrhosis ([place])   . Hemothorax on left   . Pleural mass   . Bone metastasis ([place])   . Pressure injury of skin 06/23/2020  . Hemothorax   . Small cell lung cancer ([facility]) 06/27/2020  . Chest tube in place   . Hypotension   . AKI (acute kidney injury) ([facility] [facility])   . Normocytic anemia 07/06/2020  . Cancer-related pain 07/06/2020  . Encounter for antineoplastic chemotherapy 07/06/2020  . Encounter for antineoplastic immunotherapy 07/06/2020  . Pleural effusion 07/15/2020  . Renal insufficiency 07/15/2020  . Poor appetite 07/19/2020  . Pressure injury of buttock, stage 1 07/19/2020  . Hyperlipidemia   . GERD (gastroesophageal reflux disease)   . COPD (chronic obstructive pulmonary disease) ([place] [facility])   . BPH (benign prostatic hyperplasia)   . Respiratory failure, acute-on-chronic ([place]) 07/21/2020  . Pulmonary embolism ([place]) 07/22/2020  . Palliative care encounter   . Protein-calorie malnutrition, severe 07/23/2020  . Acute bilateral deep vein thrombosis (DVT) of popliteal veins (H[facility]) 07/27/2020  . Pneumonia due to infectious organism 07/27/2020  . Goals of care, counseling/discussion 07/27/2020  .  Hypomagnesemia 08/06/2020  . Hyponatremia 08/16/2020  . Chemotherapy-induced neutropenia ([facility]) 09/19/2020   Resolved Ambulatory Problems    Diagnosis Date Noted  . No Resolved Ambulatory Problems   Past Medical History:  Diagnosis Date  . Anemia   . Back pain   . ED (erectile dysfunction)    SOCIAL HX:  Social History   Tobacco Use  . Smoking status: Former Smoker    Packs/day: 1.00    Years: 50.00    Pack years: 50.00    Types: Cigarettes    Quit date: 10/20/2013    Years since quitting: 6.9  . Smokeless tobacco: Never Used  Substance Use Topics  . Alcohol use: Not Currently    Comment: 3-4 gin and sodas a day   FAMILY HX:  Family History  Problem Relation Age of Onset  . Alzheimer's disease Mother   . Sarcoidosis Sister   . Anxiety disorder Brother   . Kidney disease Neg Hx   . Prostate cancer Neg Hx       ALLERGIES:  Allergies  Allergen Reactions  . Indomethacin Swelling    ankle swelling  . Tamsulosin Hcl Hives  . Sulfa Antibiotics Rash     PERTINENT MEDICATIONS:  Outpatient Encounter Medications as of 09/22/2020  Medication Sig  . acetaminophen (TYLENOL) 325 MG tablet Take 2 tablets (650 mg total) by mouth 3 (three) times daily.  Marland Kitchen apixaban (ELIQUIS) 5 MG TABS tablet Take 1 tablet (5 mg total) by mouth 2 (two) times daily.  . cholecalciferol (VITAMIN D3) 25 MCG (1000 UNIT) tablet Take 1,000 Units by mouth daily.  Marland Kitchen dutasteride (AVODART) 0.5 MG capsule Take 0.5 mg by mouth daily.  . feeding supplement (ENSURE ENLIVE / ENSURE PLUS) LIQD Take 237 mLs by mouth 2 (two) times daily between meals.  . folic acid (FOLVITE) 1 MG tablet Take 1 tablet (1 mg total) by mouth daily.  Marland Kitchen lactulose (CHRONULAC) 10 GM/15ML solution Take 45 mLs (30 g total) by mouth 2 (two) times daily.  Marland Kitchen lidocaine (LIDODERM) 5 % Place 1 patch onto the skin daily. Remove & Discard patch within 12 hours or as directed by MD  . Lidocaine (SALONPAS PAIN RELIEVING) 4 % PTCH Apply 1 patch topically  every morning.  . loratadine (CLARITIN) 10 MG tablet Take 10 mg by mouth daily.  . magnesium 30 MG tablet Take 400 mg by mouth daily.  . megestrol (MEGACE) 40 MG/ML suspension Take 5 mLs (200 mg total) by mouth daily.  . midodrine (PROAMATINE) 10 MG tablet Take 1 tablet (10 mg total) by mouth 4 (four) times daily.  . montelukast (SINGULAIR) 10 MG tablet TAKE 1 TABLET BY MOUTH EVERY DAY  . oxyCODONE (ROXICODONE) 15 MG immediate release tablet Take 1 tablet (15 mg total) by mouth every 4 (four) hours as needed for pain.  Marland Kitchen oxyCODONE ER (XTAMPZA ER) 13.5 MG C12A Take 1 capsule by mouth every 12 (twelve) hours.  . pantoprazole (PROTONIX) 20 MG tablet TAKE ONE TABLET BY MOUTH TWICE A DAY  . polyethylene glycol (MIRALAX / GLYCOLAX) 17 g packet Take 17 g by mouth daily as needed for moderate constipation.  . potassium chloride (KLOR-CON) 10 MEQ tablet Take 1 tablet (10 mEq total) by mouth 2 (two) times daily.  . rosuvastatin (CRESTOR) 10 MG tablet Take 1 tablet (10 mg total) by mouth daily.  . vitamin B-12 1000 MCG tablet Take 1 tablet (1,000 mcg total) by mouth daily.  . [DISCONTINUED] prochlorperazine (COMPAZINE) 10 MG tablet Take 1 tablet (10 mg total) by mouth every 6 (six) hours as needed (Nausea or vomiting). (Patient not taking: Reported on 08/24/2020)   Facility-Administered Encounter Medications as of 09/22/2020  Medication  . heparin lock flush 100 unit/mL    Objective: ROS  General: NAD EYES: denies vision changes, wears glasses ENMT: denies dysphagia Cardiovascular: denies chest pain, Pulmonary: endorses occ cough, endorses increased SOB Abdomen: endorses fair to poor appetite, endorses severe constipation, endorses continence of bowel GU: denies dysuria, endorses continence of urine MSK:  endorses ROM limitations, no falls reported Skin:  endorses discomfort at portacath due to weight loss, endorses pleurex catheter, dressing covers. Neurological: endorses increased weakness,  endorses increased pain Psych: Endorses depressed mood Heme/lymph/immuno: denies bruises, abnormal bleeding  Physical Exam: Current and past weights:12/20 weight =  204 lbs, now early 12/21 163  Lbs, clinically less,  20% weight loss in 1 year Constitutional: NAD General: frail and ill appearing, cachectic EYES: anicteric sclera,lids intact, no discharge  ENMT: intact hearing,oral mucous membranes moist CV:RRR, no LE edema Pulmonary: slight  increased work of breathing over baseline, occ  cough, no grossly audible wheezes, oxygen per [place], 2-4 L  Abdomen: intake <50%, soft and non tender, no ascites MSK: severe  sarcopenia, decreased ROM in all extremities, ambulatory with walker and stand by assis Skin: warm and dry, no rashes or wounds on visible skin, pleurex catheter dressing L thoracic area Neuro: Generalized weakness, mild cognitive impairment,  Psych: flat and anxious affect, A and O x 2-3 Hem/lymph/immuno: no widespread bruising   Thank you for the opportunity to participate in the care of Mr. Kozikowski.  The palliative care team will continue to follow. Please call our office at [phone] if we can be of additional assistance.  Jason Coop, NP , DNP, MPH, AGPCNP-BC, ACHPN  COVID-19 PATIENT SCREENING TOOL  Person answering questions: ____________family______ _____   1.  Is the patient or any family member in the home showing any signs or symptoms regarding respiratory infection?               Person with Symptom- __________NA_________________  a. Fever                                                                          Yes___ No___          ___________________  b. Shortness of breath                                                    Yes___ No___          ___________________ c. Cough/congestion                                       Yes___  No___         ___________________ d. Body aches/pains                                                         Yes___ No___         ____________________ e. Gastrointestinal symptoms (diarrhea, nausea)           Yes___ No___        ____________________  2. Within the past 14 days, has anyone living in the home had any contact with someone with or under investigation for COVID-19?    Yes___ No_X_   Person __________________

## 2020-09-23 ENCOUNTER — Telehealth: Payer: Self-pay | Admitting: Hematology and Oncology

## 2020-09-23 NOTE — Telephone Encounter (Signed)
I called Thom to set up a Virtual appt to discuss direction of care. His wife answered and said that the family was still trying to decide on Pallative care or Hospice. They want to hold off right now on a virtual visit with Dr Mike Gip until they make a decision.

## 2020-09-29 ENCOUNTER — Other Ambulatory Visit: Payer: Self-pay | Admitting: Hematology and Oncology

## 2020-09-29 DIAGNOSIS — C3490 Malignant neoplasm of unspecified part of unspecified bronchus or lung: Secondary | ICD-10-CM

## 2020-09-30 ENCOUNTER — Other Ambulatory Visit: Payer: Self-pay

## 2020-09-30 ENCOUNTER — Telehealth: Payer: Self-pay | Admitting: Hospice and Palliative Medicine

## 2020-09-30 ENCOUNTER — Encounter: Payer: Self-pay | Admitting: Pulmonary Disease

## 2020-09-30 ENCOUNTER — Ambulatory Visit (INDEPENDENT_AMBULATORY_CARE_PROVIDER_SITE_OTHER): Payer: Medicare Other | Admitting: Pulmonary Disease

## 2020-09-30 DIAGNOSIS — C3490 Malignant neoplasm of unspecified part of unspecified bronchus or lung: Secondary | ICD-10-CM

## 2020-09-30 DIAGNOSIS — D6859 Other primary thrombophilia: Secondary | ICD-10-CM | POA: Diagnosis not present

## 2020-09-30 DIAGNOSIS — J9611 Chronic respiratory failure with hypoxia: Secondary | ICD-10-CM | POA: Diagnosis not present

## 2020-09-30 DIAGNOSIS — J449 Chronic obstructive pulmonary disease, unspecified: Secondary | ICD-10-CM

## 2020-09-30 MED ORDER — IPRATROPIUM-ALBUTEROL 0.5-2.5 (3) MG/3ML IN SOLN: 3.0000 mL | RESPIRATORY_TRACT | 11 refills | Status: AC | PRN

## 2020-09-30 NOTE — Patient Instructions (Signed)
We will see Mr. Dwayne Thompson on an as needed basis.

## 2020-09-30 NOTE — Telephone Encounter (Signed)
I received a call from patient's daughter, Dwayne Thompson.  She says that patient is continuing to decline and with progressive weakness and poor oral intake.  Family have continued discussions about goals and daughter feels that the family has decided to pursue hospice.  They are interested in still having the palliative care home visit as scheduled on Friday but would like to go ahead and have hospice notified so that patient can be seen soon thereafter by hospice nursing team.  Discussed with Dr. Patsey Berthold and Dr. Mike Gip.  An order for hospice was called to [facility].

## 2020-09-30 NOTE — Telephone Encounter (Signed)
  Please call patient and ask if he needs a refill of midodrine.  M

## 2020-09-30 NOTE — Progress Notes (Signed)
Subjective:    Patient ID: Dwayne Thompson, male    DOB: 05-21-1946, 75 y.o.   MRN: [id number] Virtual Visit Via Video or Telephone Note:   This visit type was conducted due to national recommendations for restrictions regarding the COVID-19 pandemic .  This format is felt to be most appropriate for this patient at this time.  All issues noted in this document were discussed and addressed.  No physical exam was performed (except for noted visual exam findings with Video Visits).    I connected with Dwayne Thompson by telephone at 10:35 AM and verified that I was speaking with the correct person using two identifiers. Location patient: home Location provider: [facility] Persons participating in the virtual visit: patient, patient's wife (POA) patient's daughter, physician.  Because of the patient's debility, wife does most of the talking.   I discussed the limitations, risks, security and privacy concerns of performing an evaluation and management service by video and the availability of in person appointments. The patient expressed understanding and agreed to proceed.  HPI Is a 75 year old former smoker (quit 2015) whom we are connecting with today via telephone due to his progressive debility and inability to present to the office.  He has extensive small cell carcinoma and has failed chemotherapy/immunotherapy due to adverse side effects.  He has extensive tumor burden.  He is oxygen dependent due to this large tumor burden.  Patient does have a Pleurx catheter on the left which is now not draining.  His course has been complicated by bilateral DVTs and PE requiring thrombectomy.  We connected with him today and because of his debility his wife provides most of the history and relays our conversation to him.  Patient's daughter is also present in the room.  The patient can be heard on the background on occasion his voice sounds very weak.  Patient's wife relates that she and Dwayne Thompson  are ready for hospice care for Dwayne Thompson.  The children have been somewhat reluctant.  She feels that she has been "the bad guy" I have assured her that "the bad guy" has been the patient's cancer and not her.  She has been trying to care for him the best she can.  The only concerns expressed today is that Dwayne Thompson is having difficulties expectorating sputum and clearing secretions.  He has not had a fever.  Occasionally does cough up brown/rusty sputum.  He is on round-the-clock pain medication and appears that his pain is fairly well controlled.  No other concerns are voiced today.   Review of Systems A 10 point review of systems was performed and it is as noted above otherwise negative.    Objective:   Physical Exam  No physical exam performed as the visit was via telephone.  Patient did not exhibit conversational dyspnea during the visit.      Assessment & Plan:     ICD-10-CM   1. Small cell lung cancer [facility])  C34.90    Patient had adverse effects to chemotherapy/immunologics Has extensive disease Patient and wife ready for hospice services   2. Chronic respiratory failure with hypoxia (HCC)  J96.11    This is related to tumor burden particularly on the left chest Also related to prior PE Continue oxygen supplementation  3. 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 Hypercoagulable state ([place])  D68.59    Due to malignancy On Eliquis Presented with bilateral DVT/PE    Meds ordered this encounter  Medications  . ipratropium-albuterol (DUONEB) 0.5-2.5 (3) MG/3ML SOLN    Sig: Take 3 mLs by nebulization every 4 (four) hours as needed.    Dispense:  360 mL    Refill:  11    Dx:J44.9   Orders Placed This Encounter  Procedures  . AMB REFERRAL FOR DME    Referral Priority:   Routine    Referral Type:   Durable Medical Equipment Purchase    Number of Visits  Requested:   1   Discussion:  It appears that the patient and his wife have been in agreement that they would like to pursue hospice services.  [name], NP palliative care for oncology will initiate referral.  We will see the patient on an as-needed basis.  Total time of visit: 22 minutes.  Renold Don, MD [facility]   *This note was dictated using voice recognition software/Dragon.  Despite best efforts to proofread, errors can occur which can change the meaning.  Any change was purely unintentional.

## 2020-09-30 NOTE — Progress Notes (Signed)
The patient's oxygen requirements are due to extensive small cell carcinoma of the lung as well as prior massive PE.  Requirements are not from COPD.  Prior to cancer diagnosis COPD was very mild not requiring medications.

## 2020-10-02 ENCOUNTER — Other Ambulatory Visit: Payer: Self-pay | Admitting: Hospice and Palliative Medicine

## 2020-10-02 ENCOUNTER — Other Ambulatory Visit: Payer: Self-pay

## 2020-10-02 ENCOUNTER — Other Ambulatory Visit: Payer: Medicare Other | Admitting: Primary Care

## 2020-10-02 DIAGNOSIS — Z9689 Presence of other specified functional implants: Secondary | ICD-10-CM

## 2020-10-02 DIAGNOSIS — C7951 Secondary malignant neoplasm of bone: Secondary | ICD-10-CM

## 2020-10-02 DIAGNOSIS — C3490 Malignant neoplasm of unspecified part of unspecified bronchus or lung: Secondary | ICD-10-CM

## 2020-10-02 DIAGNOSIS — J948 Other specified pleural conditions: Secondary | ICD-10-CM

## 2020-10-02 DIAGNOSIS — Z515 Encounter for palliative care: Secondary | ICD-10-CM

## 2020-10-02 DIAGNOSIS — G893 Neoplasm related pain (acute) (chronic): Secondary | ICD-10-CM

## 2020-10-02 MED ORDER — APIXABAN 5 MG PO TABS
5.0000 mg | ORAL_TABLET | Freq: Two times a day (BID) | ORAL | 0 refills | Status: DC
Start: 2020-10-02 — End: 2020-10-02

## 2020-10-02 NOTE — Progress Notes (Signed)
[facility] Consult Note Telephone: [phone]  Fax: [phone]     Date of encounter: 10/02/20 PATIENT NAME: Dwayne Thompson [place] [address] [phone] (home)  DOB: 1945/10/14 MRN: [id number]  PRIMARY CARE PROVIDER:    Jerrol Banana., MD,  [address] [place] [address] [phone]  REFERRING PROVIDER:   Irean Hong, NP [place],  [place] [address]  [phone]   RESPONSIBLE PARTY:   Extended Emergency Contact Information Primary Emergency Contact: Gavan, Nordby Address: [address]          [place], [place] [address] Johnnette Litter of [place] Phone: [phone] Mobile Phone: [phone] Relation: Spouse Secondary Emergency Contact: Junior Mobile Phone: [phone] Relation: Daughter  I met face to face with patient and family in  Home.. Palliative Care was asked to follow this patient by consultation request of Borders, Kirt Boys, NP  to help address advance care planning and goals of care. This is a follow up  visit.   ASSESSMENT AND RECOMMENDATIONS:   1. Advance Care Planning/Goals of Care: Goals include to maximize quality of life and symptom management. Our advance care planning conversation included a discussion about:      Exploration of personal, cultural or spiritual beliefs that might influence medical decisions   Exploration of goals of care in the event of a sudden injury or illness   Review  of an  advance directive document .  Decision to de-escalate disease focused treatments due to poor prognosis. Decisions to admit to hospice tomorrow.  De-prescribing of medications in context of family wish to de escalate care. We discussed all medications, indications, and if they would continue to benefit him in the final months of life.  Discussed MOST form which pt was going to review. Concensus of family was that they wanted  comfort care and support in place for any symptom management.  Messaged to oncology team to ascertain best protocol for anti coagulant due to insurance constraints. This was changed and education provided.   I spent 60 minutes providing this consultation,  from 1100 to 1200. More than 50% of the time in this consultation was spent in counseling and care Coordination. ________________________________________________________________  2. Symptom Management:   Constipation: Established problem worsening: No bm x 4 days, ongoing issue. Instructed to increase senna from 1 to 2 bid and titrate up to soft formed stool qod. Also has h/o constipation and h/o  lactulose 45 ml bid, which he will resume increasing drinking fluids.  Pain: Established problem worsening: Consistently a 6/10 from log they kept, currently taking 1 dose of 15 mg daily with no reuction of pain number. Instructed to take oxycodone 15 mg every 8 hours with xtampza 13.5 mg bid for short term.  RECOMMEND hospice to change to MS contin 15 mg q 12 hours with prn roxanol 5 mg q 4 hrs prn pain, to adjust to a bid ER dose in next week.  Dypsnea; Family very concerned about pneumonia, citing increased am production. They wanted option to rx pneumonia, sent course of azithromycin. Instructed to increase oxygen to 4 l, get albuterol and nebs tomorrow. Pulmonology called in medications, will get mchine with hospice dme.  Equipment: Does not want hospital bed yet.  Would like transport chair, nebulizer and has adapt oxygen concentrator with 2 small tanks and 1 big tank.  This was communicated with hospice for provision.  3. Follow up Palliative Care  Visit: Admission to hospice upcoming. 4. Family /Caregiver/Community Supports: Lives with wife, several adult children assist.  5. Cognitive / Functional decline: a and o x 3, fatigued. Dependent in most adls and all iadls.    CODE STATUS:DNR PPS: 30%  HOSPICE ELIGIBILITY/DIAGNOSIS: yes/ lung  cancer  Subjective:  CHIEF COMPLAINT: constipation  HISTORY OF PRESENT ILLNESS:  CAREEM YASUI is a 75 y.o. year old male  with lung cancer, weight loss, debility, dyspnea, constipation. Constipation in context of functional and OIC risks. Has tried senna and miralax without complete results. Has decreased in po intake. Constipation is leading to poor intake and some abdominal pain. Constipation is ongoing and of moderate severity per [place] Bowel performance scale, (-3).  We are asked to consult around advance care planning and complex medical decision making.    Review and summarization of old Epic records shows or history from other than patient. Review or lab tests, radiology,  or medicine K WNL, low Mg, Renal fx wnl Review of case with family member daughters and wife.  History obtained from review of EMR, discussion with primary team, and  interview with family, caregiver  and/or Dwayne Thompson. Records reviewed and summarized above.   CURRENT PROBLEM LIST:  Patient Active Problem List   Diagnosis Date Noted  . Chemotherapy-induced neutropenia ([place]) 09/19/2020  . Hyponatremia 08/16/2020  . Hypomagnesemia 08/06/2020  . Acute bilateral deep vein thrombosis (DVT) of popliteal veins (HCC) 07/27/2020  . Pneumonia due to infectious organism 07/27/2020  . Goals of care, counseling/discussion 07/27/2020  . Protein-calorie malnutrition, severe 07/23/2020  . Pulmonary embolism ([place]) 07/22/2020  . Palliative care encounter   . Respiratory failure, acute-on-chronic ([place]) 07/21/2020  . Hyperlipidemia   . GERD (gastroesophageal reflux disease)   . COPD (chronic obstructive pulmonary disease) ([place])   . BPH (benign prostatic hyperplasia)   . Poor appetite 07/19/2020  . Pressure injury of buttock, stage 1 07/19/2020  . Pleural effusion 07/15/2020  . Renal insufficiency 07/15/2020  . Normocytic anemia 07/06/2020  . Cancer-related pain 07/06/2020  . Encounter for antineoplastic chemotherapy  07/06/2020  . Encounter for antineoplastic immunotherapy 07/06/2020  . AKI (acute kidney injury) ([place])   . Hypotension   . Small cell lung cancer ([place]) 06/27/2020  . Chest tube in place   . Pressure injury of skin 06/23/2020  . Pleural mass   . Bone metastasis ([place])   . Hemothorax   . Acute on chronic respiratory failure with hypoxia ([place]) 06/22/2020  . Cirrhosis ([place])   . Hemothorax on left   . Left flank pain 04/23/2020  . Hypertension 03/12/2019  . Thoracic aortic aneurysm ([place]) 03/12/2019  . Abnormal chest CT 02/13/2019  . Difficulty walking 12/06/2018  . Muscle weakness 12/06/2018  . Cellulitis of lower leg 09/17/2018  . CLE (columnar lined esophagus)   . Special screening for malignant neoplasms, colon   . Benign neoplasm of ascending colon   . Polyp of sigmoid colon   . Diverticulosis of large intestine without diverticulitis   . Iron deficiency anemia due to chronic blood loss 02/18/2018  . Folate deficiency 02/14/2018  . B12 deficiency 01/10/2018  . Folic acid deficiency 81/15/7262  . 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 Helicobacter pylori gastritis (chronic gastritis) 01/10/2018  . Thrombocytopenia ([place]) 01/10/2018  . Internal hemorrhoids   . Acute gastrointestinal hemorrhage   . Stomach irritation   . Duodenal nodule   . Melena 01/01/2018  . Neck pain on right side 03/23/2016  .  History of tobacco abuse 03/23/2016  . Cervical lymphadenitis 03/23/2016  . 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 Allergic rhinitis 03/28/2015  . 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 Failure of erection 03/28/2015  . Acid reflux 03/28/2015  . Arthritis, degenerative 03/28/2015  . Change in blood platelet count 03/28/2015  . Temporary cerebral vascular dysfunction 03/28/2015  . Personal history of tobacco use,  presenting hazards to health 03/28/2015   PAST MEDICAL HISTORY:  Active Ambulatory Problems    Diagnosis Date Noted  . Allergic rhinitis 03/28/2015  . 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 Failure of erection 03/28/2015  . Acid reflux 03/28/2015  . Arthritis, degenerative 03/28/2015  . Change in blood platelet count 03/28/2015  . Temporary cerebral vascular dysfunction 03/28/2015  . Personal history of tobacco use, presenting hazards to health 03/28/2015  . 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 Neck pain on right side 03/23/2016  . History of tobacco abuse 03/23/2016  . Cervical lymphadenitis 03/23/2016  . Melena 01/01/2018  . Duodenal nodule   . Internal hemorrhoids   . Acute gastrointestinal hemorrhage   . Stomach irritation   . B12 deficiency 01/10/2018  . Folic acid deficiency 19/50/9326  . 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 Helicobacter pylori gastritis (chronic gastritis) 01/10/2018  . Thrombocytopenia ([place]) 01/10/2018  . Folate deficiency 02/14/2018  . Iron deficiency anemia due to chronic blood loss 02/18/2018  . CLE (columnar lined esophagus)   . Special screening for malignant neoplasms, colon   . Benign neoplasm of ascending colon   . Polyp of sigmoid colon   . Diverticulosis of large intestine without diverticulitis   . Cellulitis of lower leg 09/17/2018  . Difficulty walking 12/06/2018  . Muscle weakness 12/06/2018  . Abnormal chest CT 02/13/2019  . Hypertension 03/12/2019  . Thoracic aortic aneurysm ([place]) 03/12/2019  . Left flank pain 04/23/2020  . Acute on chronic respiratory failure with hypoxia ([place]) 06/22/2020  . Cirrhosis ([place])   . Hemothorax on left   . Pleural mass   . Bone metastasis ([place])   . Pressure injury of skin 06/23/2020  . Hemothorax   . Small cell lung cancer ([place])  06/27/2020  . Chest tube in place   . Hypotension   . AKI (acute kidney injury) ([place])   . Normocytic anemia 07/06/2020  . Cancer-related pain 07/06/2020  . Encounter for antineoplastic chemotherapy 07/06/2020  . Encounter for antineoplastic immunotherapy 07/06/2020  . Pleural effusion 07/15/2020  . Renal insufficiency 07/15/2020  . Poor appetite 07/19/2020  . Pressure injury of buttock, stage 1 07/19/2020  . Hyperlipidemia   . GERD (gastroesophageal reflux disease)   . COPD (chronic obstructive pulmonary disease) ([place])   . BPH (benign prostatic hyperplasia)   . Respiratory failure, acute-on-chronic ([place]) 07/21/2020  . Pulmonary embolism ([place]) 07/22/2020  . Palliative care encounter   . Protein-calorie malnutrition, severe 07/23/2020  . Acute bilateral deep vein thrombosis (DVT) of popliteal veins (HCC) 07/27/2020  . Pneumonia due to infectious organism 07/27/2020  . Goals of care, counseling/discussion 07/27/2020  . Hypomagnesemia 08/06/2020  . Hyponatremia 08/16/2020  . Chemotherapy-induced neutropenia ([place]) 09/19/2020   Resolved Ambulatory Problems    Diagnosis Date Noted  . No Resolved Ambulatory Problems   Past Medical History:  Diagnosis Date  .  Anemia   . Back pain   . ED (erectile dysfunction)    SOCIAL HX:  Social History   Tobacco Use  . Smoking status: Former Smoker    Packs/day: 1.00    Years: 50.00    Pack years: 50.00    Types: Cigarettes    Quit date: 10/20/2013    Years since quitting: 6.9  . Smokeless tobacco: Never Used  Substance Use Topics  . Alcohol use: Not Currently    Comment: 3-4 gin and sodas a day   FAMILY HX:  Family History  Problem Relation Age of Onset  . Alzheimer's disease Mother   . Sarcoidosis Sister   . Anxiety disorder Brother   . Kidney disease Neg Hx   . Prostate cancer Neg Hx       ALLERGIES:  Allergies  Allergen Reactions  . Indomethacin Swelling    ankle swelling  . Tamsulosin Hcl Hives  . Sulfa Antibiotics  Rash     PERTINENT MEDICATIONS:  Outpatient Encounter Medications as of 10/02/2020  Medication Sig  . acetaminophen (TYLENOL) 325 MG tablet Take 2 tablets (650 mg total) by mouth 3 (three) times daily.  Marland Kitchen azithromycin (ZITHROMAX) 250 MG tablet Take 250 mg by mouth daily. 500 mg on day 1, 250 mg until finished (4 days)  . cholecalciferol (VITAMIN D3) 25 MCG (1000 UNIT) tablet Take 1,000 Units by mouth daily.  . folic acid (FOLVITE) 1 MG tablet Take 1 tablet (1 mg total) by mouth daily.  Marland Kitchen ipratropium-albuterol (DUONEB) 0.5-2.5 (3) MG/3ML SOLN Take 3 mLs by nebulization every 4 (four) hours as needed.  . lactulose (CHRONULAC) 10 GM/15ML solution Take 45 mLs (30 g total) by mouth 2 (two) times daily.  Marland Kitchen lidocaine (LIDODERM) 5 % Place 1 patch onto the skin daily. Remove & Discard patch within 12 hours or as directed by MD  . Lidocaine (SALONPAS PAIN RELIEVING) 4 % PTCH Apply 1 patch topically every morning.  . loratadine (CLARITIN) 10 MG tablet Take 10 mg by mouth daily.  . magnesium 30 MG tablet Take 400 mg by mouth daily.  . midodrine (PROAMATINE) 10 MG tablet Take 1 tablet (10 mg total) by mouth 4 (four) times daily.  Marland Kitchen oxyCODONE (ROXICODONE) 15 MG immediate release tablet Take 1 tablet (15 mg total) by mouth every 4 (four) hours as needed for pain.  Marland Kitchen oxyCODONE ER (XTAMPZA ER) 13.5 MG C12A Take 1 capsule by mouth every 12 (twelve) hours.  . pantoprazole (PROTONIX) 20 MG tablet TAKE ONE TABLET BY MOUTH TWICE A DAY  . rivaroxaban (XARELTO) 10 MG TABS tablet Take 10 mg by mouth daily.  . [DISCONTINUED] apixaban (ELIQUIS) 5 MG TABS tablet Take 1 tablet (5 mg total) by mouth 2 (two) times daily.  Marland Kitchen dutasteride (AVODART) 0.5 MG capsule Take 0.5 mg by mouth daily.  . feeding supplement (ENSURE ENLIVE / ENSURE PLUS) LIQD Take 237 mLs by mouth 2 (two) times daily between meals.  . megestrol (MEGACE) 40 MG/ML suspension Take 5 mLs (200 mg total) by mouth daily. (Patient not taking: Reported on 10/02/2020)   . montelukast (SINGULAIR) 10 MG tablet TAKE 1 TABLET BY MOUTH EVERY DAY  . polyethylene glycol (MIRALAX / GLYCOLAX) 17 g packet Take 17 g by mouth daily as needed for moderate constipation.  . potassium chloride (KLOR-CON) 10 MEQ tablet Take 1 tablet (10 mEq total) by mouth 2 (two) times daily.  . rosuvastatin (CRESTOR) 10 MG tablet Take 1 tablet (10 mg total) by mouth daily. (  Patient not taking: Reported on 10/02/2020)  . vitamin B-12 1000 MCG tablet Take 1 tablet (1,000 mcg total) by mouth daily. (Patient not taking: Reported on 10/02/2020)  . [DISCONTINUED] prochlorperazine (COMPAZINE) 10 MG tablet Take 1 tablet (10 mg total) by mouth every 6 (six) hours as needed (Nausea or vomiting). (Patient not taking: Reported on 08/24/2020)   Facility-Administered Encounter Medications as of 10/02/2020  Medication  . heparin lock flush 100 unit/mL    Objective: ROS  General: NAD EYES: denies vision changes, wears glasses ENMT: denies dysphagia Cardiovascular: denies chest pain Pulmonary: endorses cough, endorses  increased SOB Abdomen: endorses fair to poor  appetite, endorses severe constipation, endorses continence of bowel GU: denies dysuria, endorses continence of urine MSK:  endorses ROM limitations, no falls reported Skin: denies rashes or wounds, L abdomen pleurvac Neurological: endorses weakness, endorses fatigue, endorses 6/10 pain, denies insomnia Psych: Endorses flat mood Heme/lymph/immuno: denies bruises, abnormal bleeding  Physical Exam: Current and past weights: 150 lbs  Constitutional: Vital signs, NAD 136/68 HR 95 RR 24 General: frail appearing, thin EYES: anicteric sclera,lids intact, no discharge  ENMT: intact hearing,oral mucous membranes moist, dentition intact CV: S1S2, RRR, no LE edema Pulmonary: moving air poorly, + increased work of breathing, + cough, no audible wheezes,oxygen at 3 L Abdomen: intake 25-50%, normo-active BS +  4 quadrants, soft and non tender, no  ascites (has paracentesis cath) GU: deferred MSK: severe  sarcopenia, decreased ROM in all extremities, no contractures of LE, ambulatory with help. Skin: warm and dry, no rashes or wounds on visible skin Neuro: Generalized weakness, slight  cognitive impairment Psych: non-anxious affect, A and O x 3 Hem/lymph/immuno: no widespread bruising.    Thank you for the opportunity to participate in the care of Mr. Lenhoff.  The palliative care team will continue to follow. Please call our office at [phone] if we can be of additional assistance.  Jason Coop, NP , DNP, MPH, AGPCNP-BC, ACHPN  COVID-19 PATIENT SCREENING TOOL  Person answering questions: ____________janet______ _____   1.  Is the patient or any family member in the home showing any signs or symptoms regarding respiratory infection?               Person with Symptom- __________NA_________________  a. Fever                                                                          Yes___ No___          ___________________  b. Shortness of breath                                                    Yes___ No___          ___________________ c. Cough/congestion                                       Yes___  No___         ___________________ d. Body aches/pains  Yes___ No___        ____________________ e. Gastrointestinal symptoms (diarrhea, nausea)           Yes___ No___        ____________________  2. Within the past 14 days, has anyone living in the home had any contact with someone with or under investigation for COVID-19?    Yes___ No_X_   Person __________________

## 2020-10-09 ENCOUNTER — Other Ambulatory Visit: Payer: Self-pay | Admitting: Family Medicine

## 2020-10-09 DIAGNOSIS — I700 Atherosclerosis of aorta: Secondary | ICD-10-CM

## 2020-10-20 DEATH — deceased

## 2020-10-21 ENCOUNTER — Telehealth: Payer: Self-pay

## 2020-10-21 NOTE — Telephone Encounter (Signed)
Rollene Fare was advised.

## 2020-10-21 NOTE — Telephone Encounter (Signed)
I will need the death certificate here. Have not finished signing up online. There is a back log I believe.

## 2020-10-21 NOTE — Telephone Encounter (Signed)
Copied from [place] [id number]. Topic: General - Other >> Oct 21, 2020 10:33 AM Antonieta Iba C wrote: Reason for CRM: Rollene Fare with funeral home is calling in to update provider that pt has passed away. Rollene Fare says that pt's Death Certificate is online in the [facility] system. She says that provider would need to certify pt's cause of death and  Signed it so that it can be sent to the local health dept.    [place] Phone: [phone] if needed.

## 2020-10-23 ENCOUNTER — Telehealth: Payer: Self-pay

## 2020-10-23 NOTE — Telephone Encounter (Signed)
Copied from [place] [id number]. Topic: General - Inquiry >> Oct 23, 2020 12:12 PM Gillis Ends D wrote: Reason for CRM: Patient would like a call back from Dr. Rosanna Randy, She wanted to let you know that you are now in the system to sign Death Certificates. Its called [facility] system. She said if you have any questions call [phone]. Astrid Divine is the person who called and she an be reached at [phone]. Please advise

## 2020-10-23 NOTE — Telephone Encounter (Signed)
Please review. Thanks!  

## 2020-10-30 NOTE — Telephone Encounter (Signed)
Patient is expired as of 2020/10/16. I spoke with wife and apologized when she told me this

## 2020-11-16 ENCOUNTER — Telehealth: Payer: Medicare Other | Admitting: Hospice and Palliative Medicine

## 2020-12-22 ENCOUNTER — Telehealth: Payer: Self-pay | Admitting: Family Medicine

## 2020-12-22 NOTE — Telephone Encounter (Signed)
Called at the request of family for deceased patient.  Family requests that the death certificate be redone to reflect a different cause of death.  They stated that what is currently on the certificate is different from what the doctor stated was the cause of death.  Please call to discuss further at [phone]

## 2020-12-22 NOTE — Telephone Encounter (Signed)
Caller said this can wait until provider gets back

## 2021-01-03 IMAGING — MR MR THORACIC SPINE WO/W CM
16 of 22 series · 29 of 48 positions shown · IV contrast (gadavist)
Comparison: Prior PET-CT from 06/12/2020.

CLINICAL DATA: Initial evaluation for non-small cell lung cancer,
pretreatment staging. Abnormality T11-12.

EXAM:
MRI THORACIC WITHOUT AND WITH CONTRAST
TECHNIQUE: Multiplanar and multiecho pulse sequences of the thoracic spine were
obtained without and with intravenous contrast.
CONTRAST:  7.5mL GADAVIST GADOBUTROL 1 MMOL/ML IV SOLN

[Series 18: T1 · sagittal · 6.0mm · 1.88mm/px · 1 of 9 slices shown (1 of 4)]
[im 1/9]
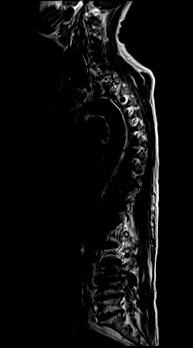

[Series 19: T2 · sagittal · 3.0mm · 1.33mm/px · 1 of 20 slices shown (1 of 4)]
[im 1/20]
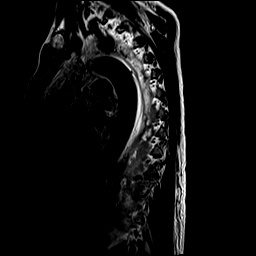

[Series 20: T1 · sagittal · 3.0mm · 1.33mm/px · 1 of 20 slices shown (2 of 4)]
[im 1/20]
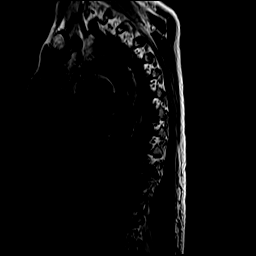

[Series 21: STIR · sagittal · 3.0mm · 0.66mm/px · 1 of 20 slices shown]
[im 1/20]
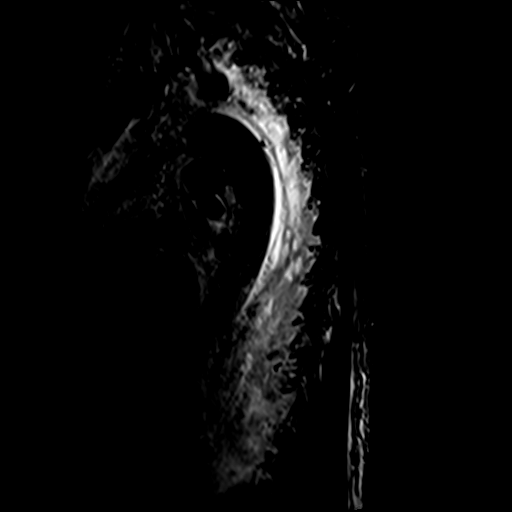

[Series 22: T2 · axial · 4.0mm · 0.59mm/px · z∈[-402,-146]mm · 2 of 39 slices shown (2 of 4)]
[im 1/39]
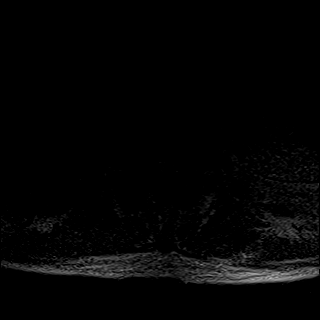
[im 39/39]
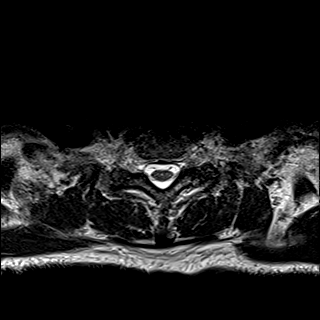

[Series 23: GRE · axial · 4.0mm · 0.37mm/px · z∈[-402,-223]mm · 2 of 39 slices shown]
[im 1/39]
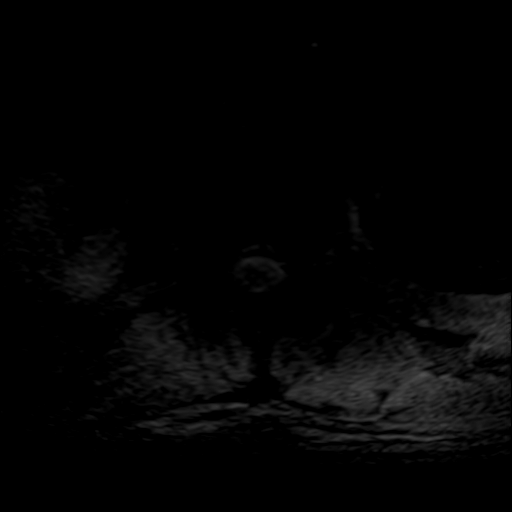
[im 20/39]
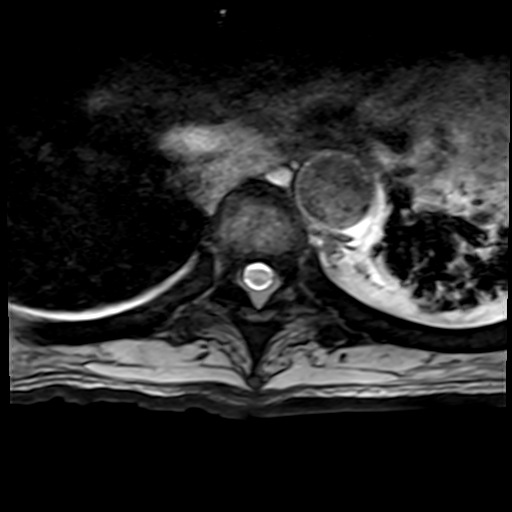

[Series 24: T1 post-contrast · axial · non-contrast · 4.0mm · 0.37mm/px · z∈[-402,-146]mm · 3 of 39 slices shown (1 of 5)]
[im 1/39]
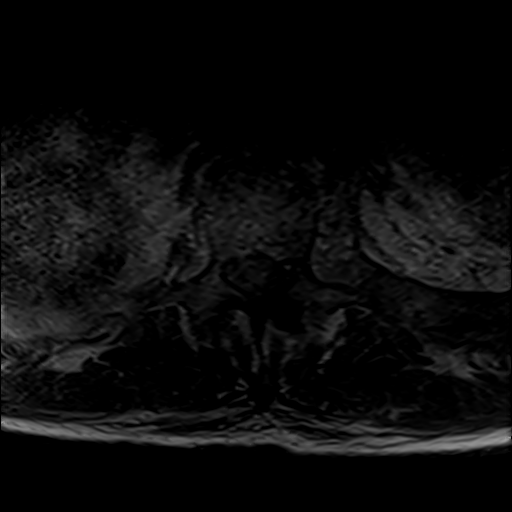
[im 20/39]
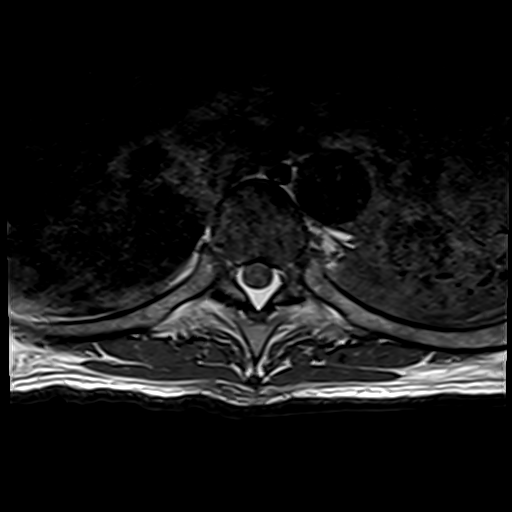
[im 39/39]
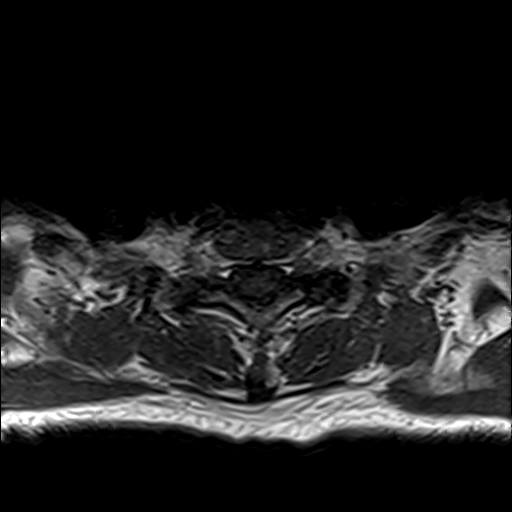

[Series 30: T1 · sagittal · 5.0mm · 0.94mm/px · 1 of 21 slices shown (3 of 4)]
[im 1/21]
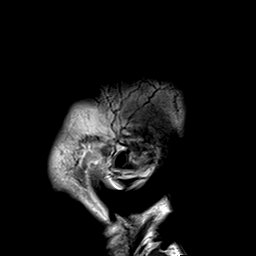

[Series 31: T2 · axial · 5.0mm · 0.45mm/px · z∈[+25,+179]mm · 2 of 27 slices shown (3 of 4)]
[im 1/27]
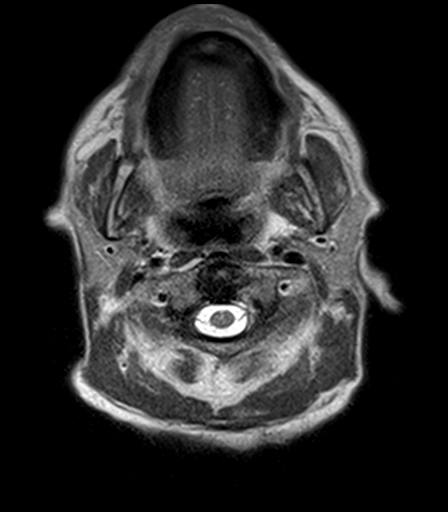
[im 27/27]
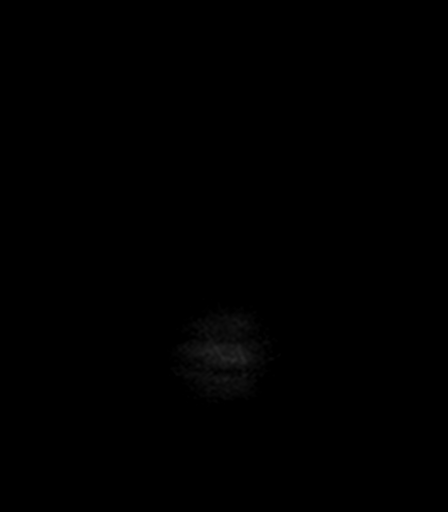

[Series 34: T1 · axial · 5.0mm · 0.90mm/px · z∈[+25,+179]mm · 2 of 27 slices shown (4 of 4)]
[im 1/27]
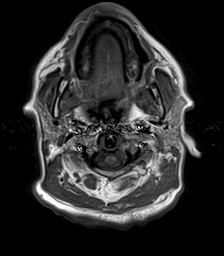
[im 27/27]
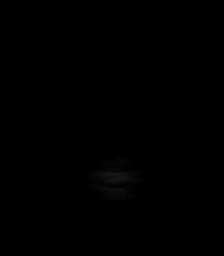

[Series 35: T2 · coronal · 5.0mm · 0.45mm/px · 3 of 40 slices shown (4 of 4)]
[im 1/40]
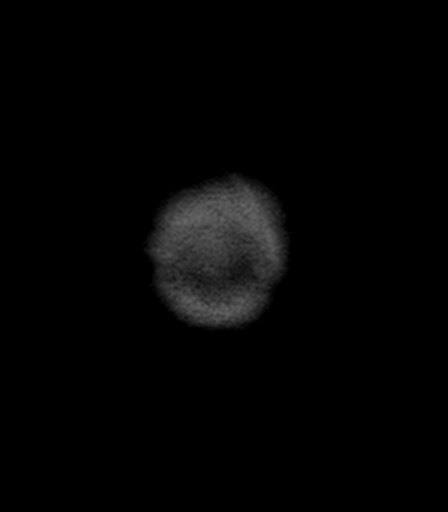
[im 20/40]
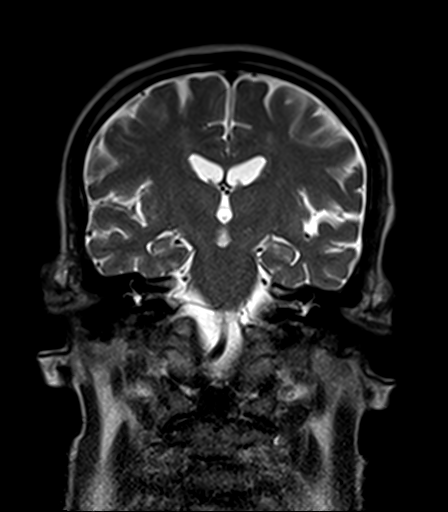
[im 40/40]
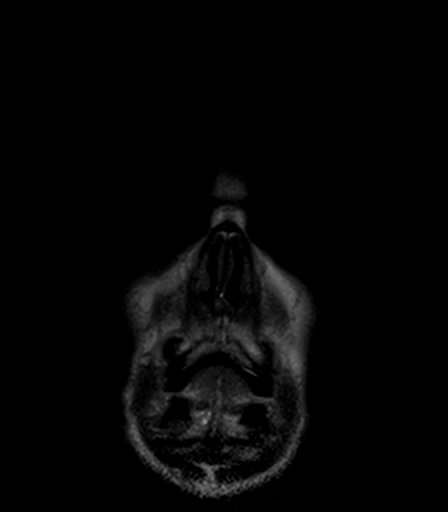

[Series 36: T1 post-contrast · axial · 5.0mm · 0.90mm/px · z∈[+25,+179]mm · 2 of 27 slices shown (2 of 5)]
[im 1/27]
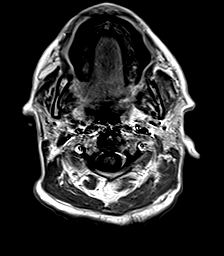
[im 27/27]
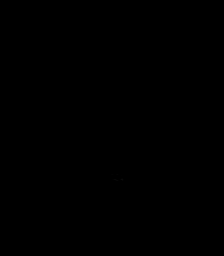

[Series 37: T1 post-contrast · coronal · 5.0mm · 0.90mm/px · 3 of 40 slices shown (3 of 5)]
[im 1/40]
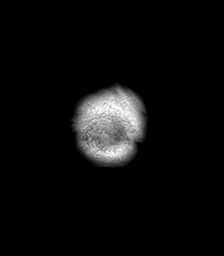
[im 20/40]
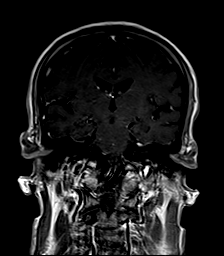
[im 40/40]
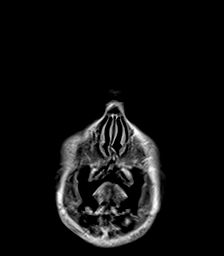

[Series 38: T1 post-contrast · sagittal · 5.0mm · 0.62mm/px · 1 of 21 slices shown (4 of 5)]
[im 1/21]
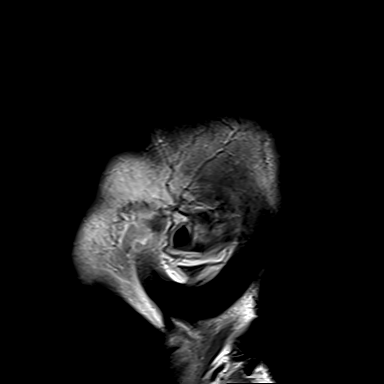

[Series 39: T1 fat-sat post-contrast · sagittal · 3.0mm · 1.33mm/px · 1 of 20 slices shown]
[im 1/20]
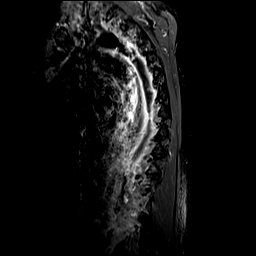

[Series 40: T1 post-contrast · axial · 4.0mm · 0.37mm/px · z∈[-402,-146]mm · 3 of 39 slices shown (5 of 5)]
[im 1/39]
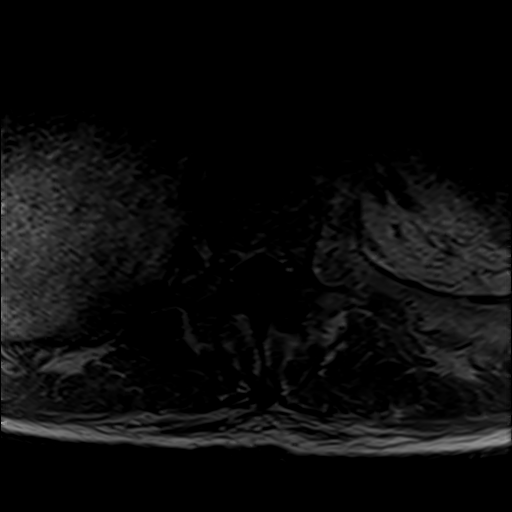
[im 20/39]
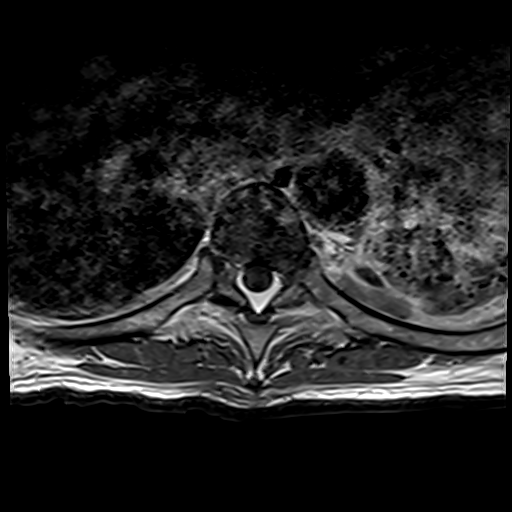
[im 39/39]
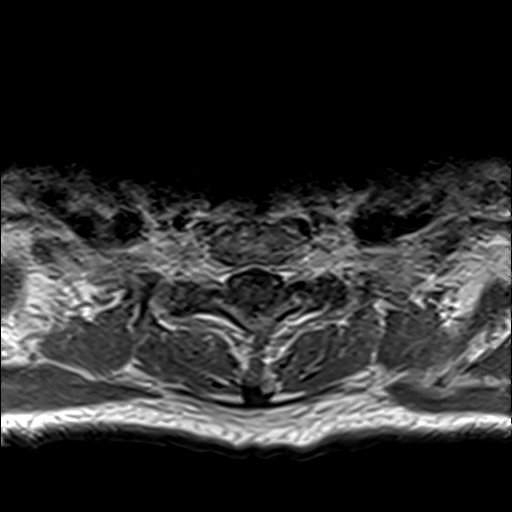

[29 of 48 positions shown; findings below may reference images not displayed]

FINDINGS: MRI THORACIC SPINE FINDINGS

Alignment: Vertebral bodies normally aligned with preservation of
the normal thoracic kyphosis. No listhesis.

Vertebrae: Signal intensity within the visualized bone marrow is
diffusely heterogeneous. A left paraspinous soft tissue mass is seen
along the left anterolateral aspect of the vertebral column
extending from approximately T9-10 through T12-L1 (series 20, image
18). Evidence for invasion/involvement of the adjacent T10 and T11
vertebral bodies (series 21, image 14). Tumor partially extends into
the left neural foramen at T11-12 (series 40, image 34). No other
significant extension into the spinal canal or epidural tumor.

A separate 13 mm metastasis seen involving the central/right aspect
of the T10 vertebral body (series 39, image 8). Probable additional
13 mm metastatic lesion seen at T1 (series 20, image 9). 1 cm lesion
seen within the right pedicle of T6 (series 21, image 6).

No associated pathologic fracture.

Cord:  Normal signal and morphology.  No significant epidural tumor.

Paraspinal and other soft tissues: Paraspinous tumor as above.
Paraspinous soft tissues demonstrate no other acute finding.
Irregular and partially loculated left pleural effusion with
scattered fluid-fluid levels partially visualized at the left lung
base. Consolidative changes noted throughout the posterior left lung
superiorly.

Disc levels:

Normal expected multilevel degenerative disc desiccation seen within
the thoracic spine. Mild disc bulging noted at the T7-8 level
without significant spinal stenosis. Multilevel facet hypertrophy
throughout the lower thoracic spine. No significant stenosis or
foraminal encroachment.
IMPRESSION: 1. Left paraspinous soft tissue mass extending from T9-10 through
T12-L1, with invasion/involvement of the adjacent T10 and T11
vertebral bodies. Tumor partially extends into the left neural
foramen at T11-12. No other significant extension into the spinal
canal or epidural tumor.
2. Additional metastatic lesions involving the T1 and T10 vertebral
bodies as well as the right pedicle of T6. No associated pathologic
fracture.
3. Heterogeneous and partially loculated left pleural effusion with
consolidation signal intensity throughout the visualized left lower
lung.

## 2021-01-03 IMAGING — MR MR HEAD WO/W CM
12 of 13 series · 43 of 48 positions shown · IV contrast (gadavist)
Comparison: Prior MRI from 03/04/2013.

CLINICAL DATA: Initial evaluation for small cell lung cancer,
staging.

EXAM:
MRI HEAD WITHOUT AND WITH CONTRAST
TECHNIQUE: Multiplanar, multiecho pulse sequences of the brain and surrounding
structures were obtained without and with intravenous contrast.
CONTRAST:  7.5mL GADAVIST GADOBUTROL 1 MMOL/ML IV SOLN

[Series 26: ax dwi_tracew · axial · 3.0mm · 0.71mm/px · z∈[+14,+177]mm · 7 of 56 slices shown]
[im 1/56]
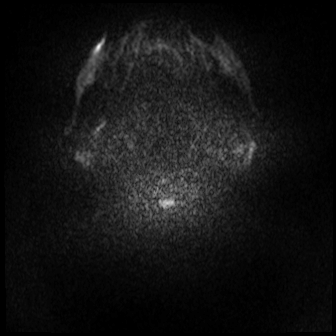
[im 10/56]
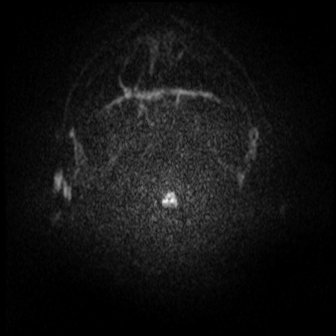
[im 19/56]
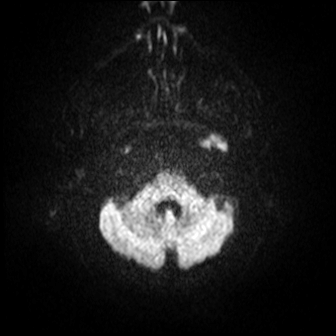
[im 28/56]
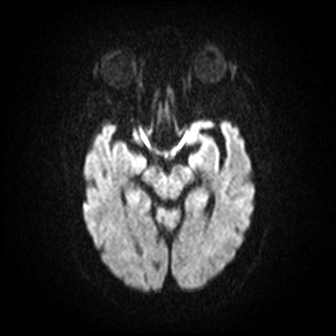
[im 37/56]
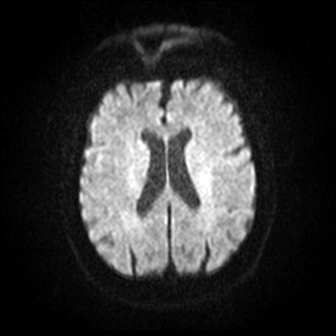
[im 46/56]
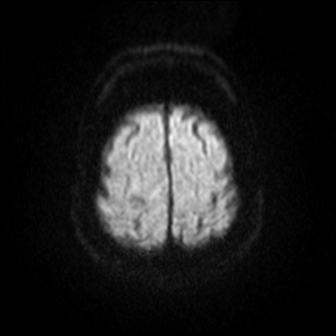
[im 56/56]
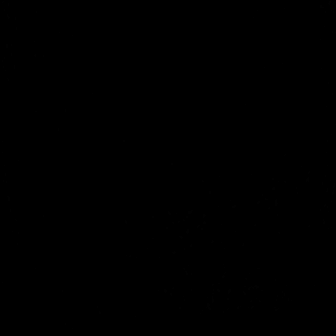

[Series 27: ax dwi_adc · axial · 3.0mm · 0.71mm/px · z∈[+14,+174]mm · 6 of 55 slices shown]
[im 1/55]
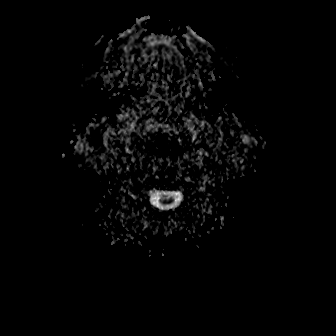
[im 11/55]
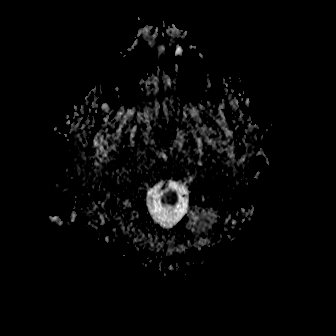
[im 22/55]
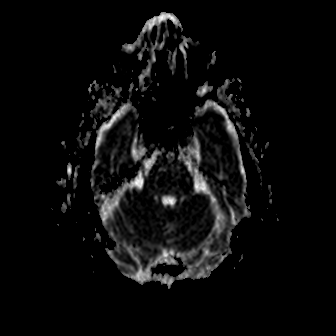
[im 33/55]
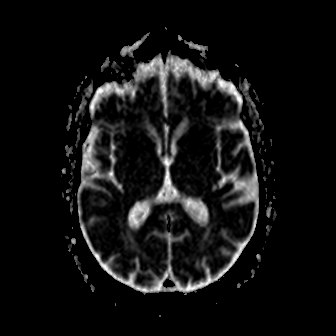
[im 44/55]
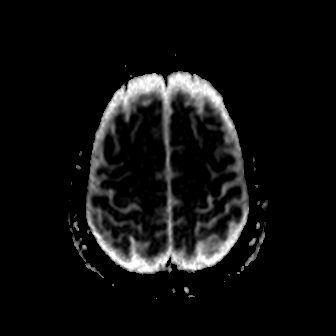
[im 55/55]
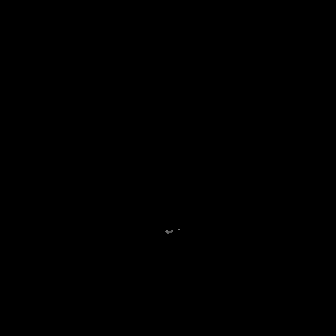

[Series 28: cor dwi_tracew · coronal · 5.0mm · 0.68mm/px · 4 of 40 slices shown]
[im 1/40]
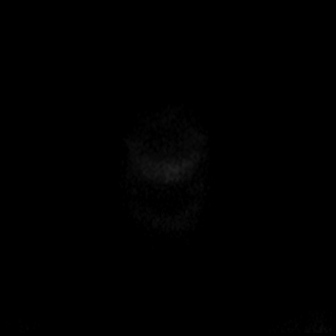
[im 14/40]
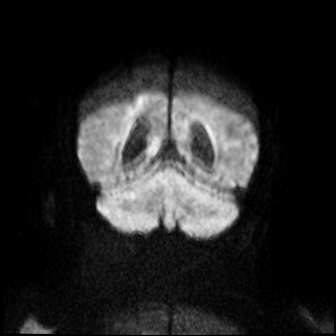
[im 27/40]
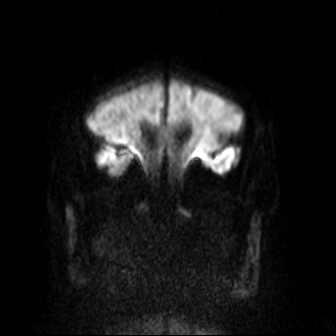
[im 40/40]
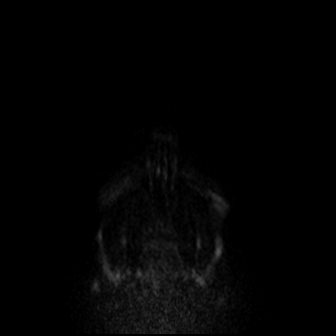

[Series 29: cor dwi_adc · coronal · 5.0mm · 0.68mm/px · 2 of 39 slices shown]
[im 1/39]
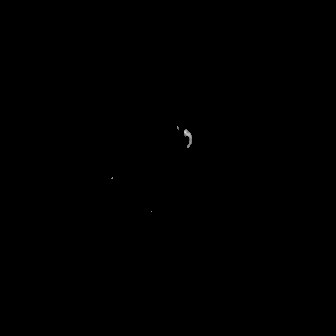
[im 13/39]
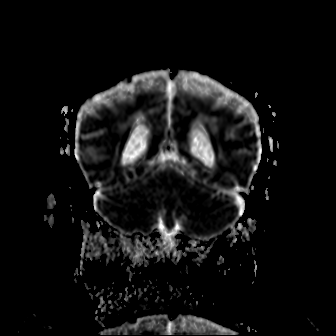

[Series 30: T1 · sagittal · 5.0mm · 0.94mm/px · 2 of 21 slices shown (1 of 2)]
[im 1/21]
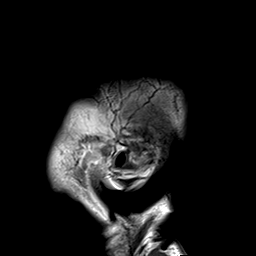
[im 21/21]
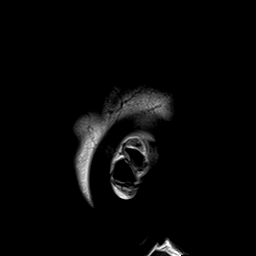

[Series 31: T2 · axial · 5.0mm · 0.45mm/px · z∈[+25,+179]mm · 3 of 27 slices shown (1 of 2)]
[im 1/27]
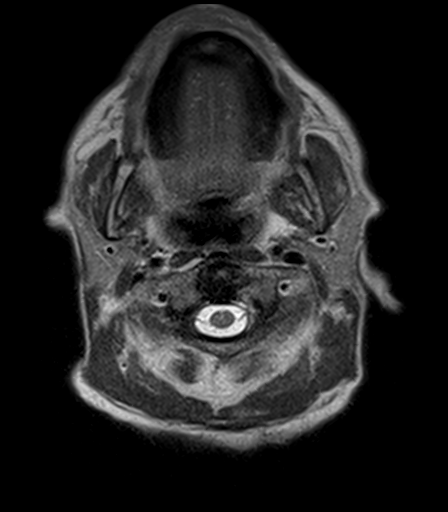
[im 14/27]
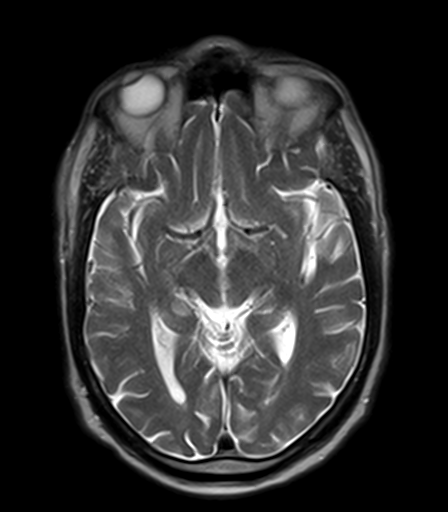
[im 27/27]
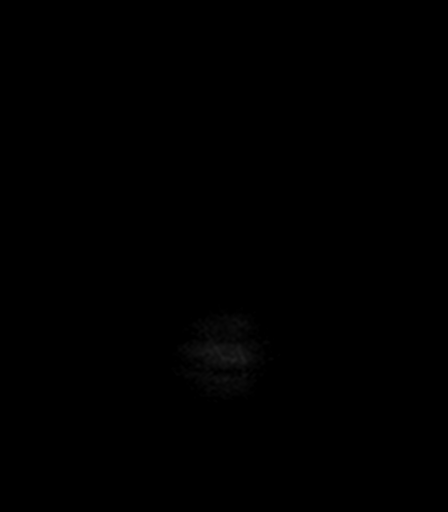

[Series 33: FLAIR · axial · 5.0mm · 1.20mm/px · z∈[+25,+173]mm · 3 of 26 slices shown]
[im 1/26]
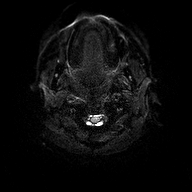
[im 13/26]
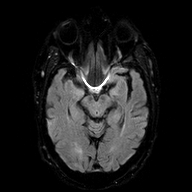
[im 26/26]
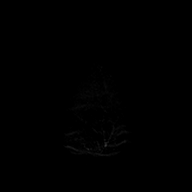

[Series 34: T1 · axial · 5.0mm · 0.90mm/px · z∈[+25,+179]mm · 3 of 27 slices shown (2 of 2)]
[im 1/27]
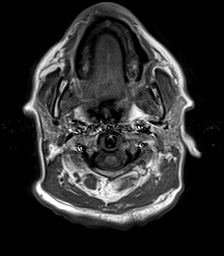
[im 14/27]
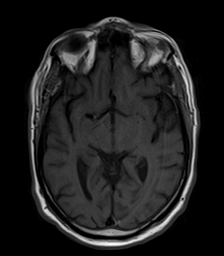
[im 27/27]
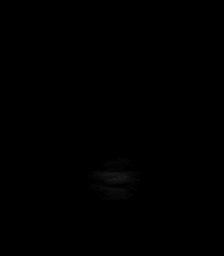

[Series 35: T2 · coronal · 5.0mm · 0.45mm/px · 4 of 40 slices shown (2 of 2)]
[im 1/40]
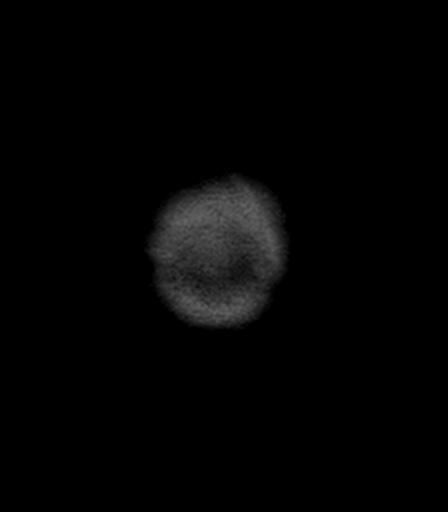
[im 14/40]
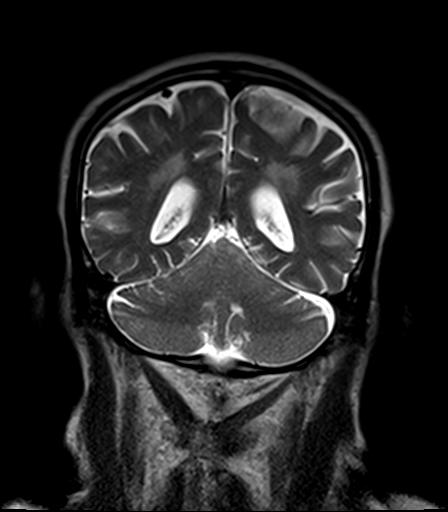
[im 27/40]
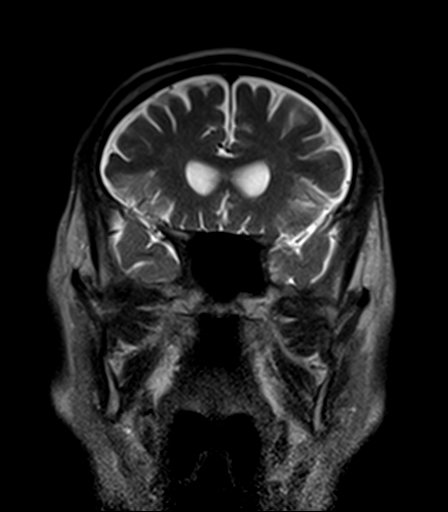
[im 40/40]
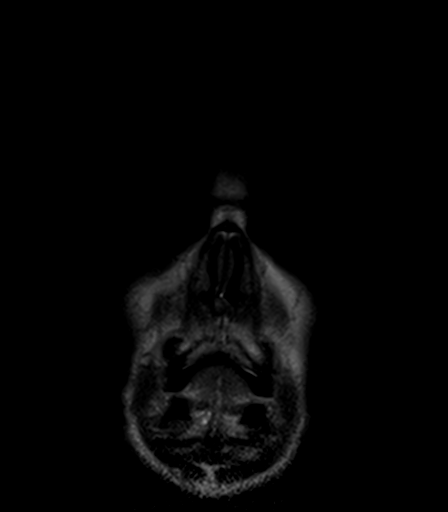

[Series 36: T1 post-contrast · axial · 5.0mm · 0.90mm/px · z∈[+25,+179]mm · 3 of 27 slices shown (1 of 3)]
[im 1/27]
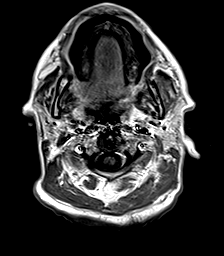
[im 14/27]
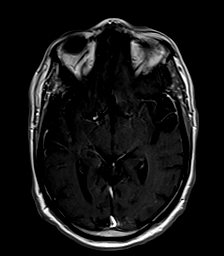
[im 27/27]
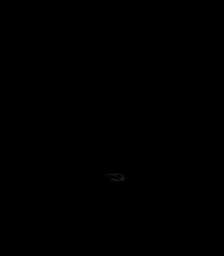

[Series 37: T1 post-contrast · coronal · 5.0mm · 0.90mm/px · 4 of 40 slices shown (2 of 3)]
[im 1/40]
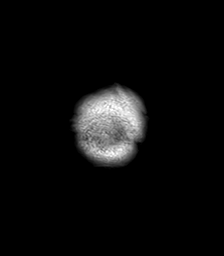
[im 14/40]
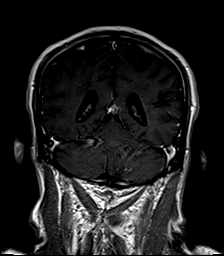
[im 27/40]
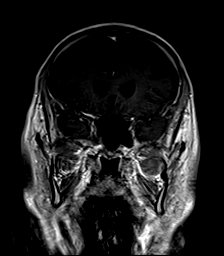
[im 40/40]
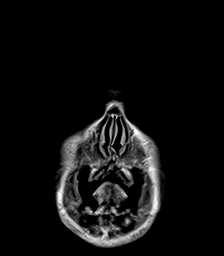

[Series 38: T1 post-contrast · sagittal · 5.0mm · 0.62mm/px · 2 of 21 slices shown (3 of 3)]
[im 1/21]
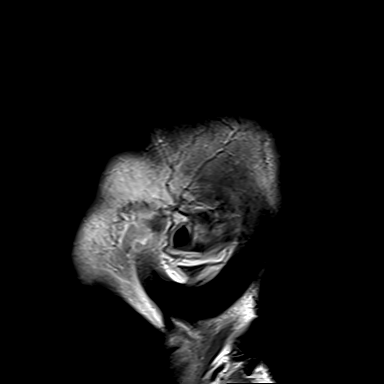
[im 21/21]
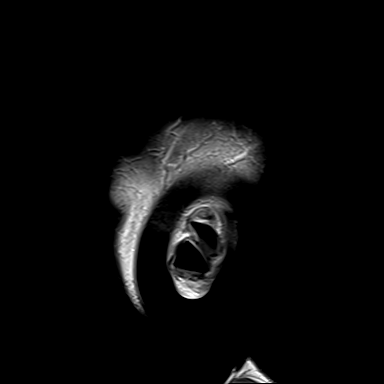

[43 of 48 positions shown; findings below may reference images not displayed]

FINDINGS: Brain: Examination degraded by motion artifact.

Age-related cerebral atrophy with mild-to-moderate chronic
microvascular ischemic disease. No acute intracranial infarct.
Gray-white matter differentiation maintained. No encephalomalacia to
suggest chronic cortical infarction. No foci of susceptibility
artifact to suggest acute or chronic intracranial hemorrhage.

No mass lesion, midline shift or mass effect. No hydrocephalus or
extra-axial fluid collection. Pituitary gland suprasellar region
normal. Midline structures intact. No abnormal enhancement or
findings to suggest intracranial metastatic disease.

Vascular: Major intracranial vascular flow voids are maintained.

Skull and upper cervical spine: Craniocervical junction within
normal limits. No focal marrow replacing lesion. Multilevel
degenerative spondylolysis noted within the upper cervical spine. No
scalp soft tissue abnormality.

Sinuses/Orbits: Globes and orbital soft tissues demonstrate no acute
finding. Paranasal sinuses are largely clear. No mastoid effusion.
Inner ear structures grossly normal.

Other: None.
IMPRESSION: 1. No evidence for intracranial metastatic disease.
2. No other acute intracranial abnormality.
3. Mild to moderate chronic microvascular ischemic disease.

## 2024-06-25 NOTE — Progress Notes (Signed)
 Assessment & Plan:  1. Abnormal findings on diagnostic imaging of lung (Primary)   Patient Instructions  1.  The spot that was showing on the CT that he had previously in May has now resolved.  We will get you back to a once a year CT and that will be in December 2021.  2.  We will see you in a year's time call sooner should any new difficulties arise.  Please note: late entry documentation due to logistical difficulties during COVID-19 pandemic. This note is filed for information purposes only, and is not intended to be used for billing, nor does it represent the full scope/nature of the visit in question. Please see any associated scanned media linked to date of encounter for additional pertinent information.  Subjective:    HPI: Dwayne Thompson is a 78 y.o. male presenting to the pulmonology clinic on 09/02/2019 with report of: Follow-up (CT 08/28/2019-pt states breathing is doing well. no current sx. )     Outpatient Encounter Medications as of 09/02/2019  Medication Sig   acetaminophen  (TYLENOL ) 325 MG tablet Take 2 tablets (650 mg total) by mouth 3 (three) times daily.   folic acid  (FOLVITE ) 1 MG tablet Take 1 tablet (1 mg total) by mouth daily.   vitamin B-12 1000 MCG tablet Take 1 tablet (1,000 mcg total) by mouth daily. (Patient not taking: Reported on 10/02/2020)   [DISCONTINUED] AMBULATORY NON FORMULARY MEDICATION Trimix (30 mg PAPA, 1 mL phen, 50 mg prostaglandin)   Vial 5ml   Dose 0.8mcg  Qty 10 #6 refills  [facility] [phone] Fax [phone]   [DISCONTINUED] aspirin  EC 81 MG EC tablet Take 1 tablet (81 mg total) by mouth daily.   [DISCONTINUED] cetirizine  (ZYRTEC ) 10 MG tablet Take 1 tablet (10 mg total) by mouth daily.   [DISCONTINUED] Cholecalciferol  1000 UNITS tablet Take 1,000 Units by mouth daily.    [DISCONTINUED] cyclobenzaprine  (FLEXERIL ) 10 MG tablet TAKE ONE TABLET BY MOUTH TWICE A DAY AS NEEDED AND TAKE ONE TABLET BY MOUTH EVERY  EVENING (Patient not taking: Reported on 06/17/2020)   [DISCONTINUED] doxazosin  (CARDURA ) 4 MG tablet TAKE 1 TABLET DAILY   [DISCONTINUED] HYDROcodone -acetaminophen  (NORCO/VICODIN) 5-325 MG tablet Take 1 tablet by mouth every 6 (six) hours as needed for moderate pain.    [DISCONTINUED] lisinopril  (PRINIVIL ,ZESTRIL ) 10 MG tablet Take 1 tablet (10 mg total) by mouth daily.   [DISCONTINUED] montelukast  (SINGULAIR ) 10 MG tablet Take 1 tablet (10 mg total) by mouth daily.   [DISCONTINUED] pantoprazole  (PROTONIX ) 20 MG tablet Take 1 tablet (20 mg total) by mouth 2 (two) times daily.   [DISCONTINUED] potassium chloride  (K-DUR,KLOR-CON ) 10 MEQ tablet Take 1 tablet (10 mEq total) by mouth 2 (two) times daily.   [DISCONTINUED] rosuvastatin  (CRESTOR ) 10 MG tablet Take 1 tablet (10 mg total) by mouth daily.   [DISCONTINUED] tadalafil  (ADCIRCA /CIALIS ) 20 MG tablet Take 1 tablet (20 mg total) by mouth daily as needed for erectile dysfunction. (Patient not taking: Reported on 06/17/2020)   [DISCONTINUED] valACYclovir  (VALTREX ) 1000 MG tablet Take 1 tablet (1,000 mg total) by mouth 3 (three) times daily. (Patient not taking: Reported on 06/08/2020)   No facility-administered encounter medications on file as of 09/02/2019.      Objective:   Vitals:   09/02/19 1110  BP: 136/78  Pulse: 95  Temp: (!) 97.1 F (36.2 C)  Height: 5' 11 (1.803 m)  Weight: 203 lb (92.1 kg)  SpO2: 97%  TempSrc: Temporal  BMI (Calculated): 28.33  Physical exam documentation is limited by delayed entry of information.
